# Patient Record
Sex: Male | Born: 1935
Health system: Southern US, Community
[De-identification: ages and names within clinical notes are randomized; demographics above are authoritative.]

## PROBLEM LIST (undated history)

## (undated) DIAGNOSIS — I509 Heart failure, unspecified: Secondary | ICD-10-CM

## (undated) DIAGNOSIS — R531 Weakness: Secondary | ICD-10-CM

## (undated) DIAGNOSIS — F419 Anxiety disorder, unspecified: Secondary | ICD-10-CM

## (undated) DIAGNOSIS — I2510 Atherosclerotic heart disease of native coronary artery without angina pectoris: Secondary | ICD-10-CM

## (undated) DIAGNOSIS — R0609 Other forms of dyspnea: Secondary | ICD-10-CM

## (undated) DIAGNOSIS — Z7722 Contact with and (suspected) exposure to environmental tobacco smoke (acute) (chronic): Secondary | ICD-10-CM

## (undated) DIAGNOSIS — Z87442 Personal history of urinary calculi: Secondary | ICD-10-CM

## (undated) DIAGNOSIS — K5790 Diverticulosis of intestine, part unspecified, without perforation or abscess without bleeding: Secondary | ICD-10-CM

## (undated) DIAGNOSIS — R0600 Dyspnea, unspecified: Secondary | ICD-10-CM

## (undated) DIAGNOSIS — K5730 Diverticulosis of large intestine without perforation or abscess without bleeding: Secondary | ICD-10-CM

## (undated) DIAGNOSIS — I219 Acute myocardial infarction, unspecified: Secondary | ICD-10-CM

## (undated) DIAGNOSIS — E785 Hyperlipidemia, unspecified: Secondary | ICD-10-CM

## (undated) DIAGNOSIS — E663 Overweight: Secondary | ICD-10-CM

## (undated) DIAGNOSIS — N400 Enlarged prostate without lower urinary tract symptoms: Secondary | ICD-10-CM

## (undated) DIAGNOSIS — E139 Other specified diabetes mellitus without complications: Secondary | ICD-10-CM

## (undated) DIAGNOSIS — R3129 Other microscopic hematuria: Secondary | ICD-10-CM

## (undated) DIAGNOSIS — I255 Ischemic cardiomyopathy: Secondary | ICD-10-CM

## (undated) DIAGNOSIS — K219 Gastro-esophageal reflux disease without esophagitis: Secondary | ICD-10-CM

## (undated) DIAGNOSIS — R7301 Impaired fasting glucose: Secondary | ICD-10-CM

## (undated) DIAGNOSIS — I10 Essential (primary) hypertension: Secondary | ICD-10-CM

## (undated) HISTORY — DX: Overweight: E66.3

## (undated) HISTORY — DX: Essential (primary) hypertension: I10

## (undated) HISTORY — DX: Hyperlipidemia, unspecified: E78.5

## (undated) HISTORY — DX: Atherosclerotic heart disease of native coronary artery without angina pectoris: I25.10

## (undated) HISTORY — DX: Anxiety disorder, unspecified: F41.9

## (undated) HISTORY — DX: Impaired fasting glucose: R73.01

## (undated) HISTORY — DX: Benign prostatic hyperplasia without lower urinary tract symptoms: N40.0

## (undated) HISTORY — DX: Other microscopic hematuria: R31.29

## (undated) HISTORY — PX: APPENDECTOMY: SHX54

## (undated) HISTORY — DX: Other specified diabetes mellitus without complications: E13.9

## (undated) HISTORY — DX: Diverticulosis of large intestine without perforation or abscess without bleeding: K57.30

## (undated) HISTORY — DX: Gastro-esophageal reflux disease without esophagitis: K21.9

## (undated) HISTORY — DX: Acute myocardial infarction, unspecified: I21.9

## (undated) HISTORY — DX: Diverticulosis of intestine, part unspecified, without perforation or abscess without bleeding: K57.90

## (undated) HISTORY — PX: PILONIDAL CYST EXCISION: SHX744

## (undated) HISTORY — DX: Ischemic cardiomyopathy: I25.5

---

## 1998-02-10 HISTORY — PX: CORONARY ARTERY BYPASS GRAFT: SHX141

## 1998-02-10 HISTORY — PX: CARDIAC CATHETERIZATION: SHX172

## 1999-02-11 DIAGNOSIS — I219 Acute myocardial infarction, unspecified: Secondary | ICD-10-CM

## 1999-02-11 HISTORY — DX: Acute myocardial infarction, unspecified: I21.9

## 1999-08-20 ENCOUNTER — Ambulatory Visit (HOSPITAL_COMMUNITY): Admission: RE | Admit: 1999-08-20 | Discharge: 1999-08-20 | Payer: Self-pay | Admitting: *Deleted

## 1999-08-28 ENCOUNTER — Encounter: Payer: Self-pay | Admitting: Surgery

## 1999-08-30 ENCOUNTER — Inpatient Hospital Stay (HOSPITAL_COMMUNITY): Admission: RE | Admit: 1999-08-30 | Discharge: 1999-09-03 | Payer: Self-pay | Admitting: Surgery

## 1999-08-31 ENCOUNTER — Encounter: Payer: Self-pay | Admitting: Surgery

## 1999-09-01 ENCOUNTER — Encounter: Payer: Self-pay | Admitting: Surgery

## 1999-09-02 ENCOUNTER — Encounter: Payer: Self-pay | Admitting: Cardiothoracic Surgery

## 2002-02-10 HISTORY — PX: COLONOSCOPY: SHX174

## 2002-09-07 ENCOUNTER — Encounter: Payer: Self-pay | Admitting: Gastroenterology

## 2002-09-28 ENCOUNTER — Encounter: Payer: Self-pay | Admitting: Gastroenterology

## 2002-09-28 LAB — HM COLONOSCOPY

## 2002-11-10 ENCOUNTER — Ambulatory Visit: Admission: RE | Admit: 2002-11-10 | Discharge: 2002-11-10 | Payer: Self-pay | Admitting: Pulmonary Disease

## 2002-11-14 ENCOUNTER — Encounter (HOSPITAL_COMMUNITY): Admission: RE | Admit: 2002-11-14 | Discharge: 2003-02-12 | Payer: Self-pay | Admitting: Cardiology

## 2003-02-11 HISTORY — PX: CARDIAC DEFIBRILLATOR PLACEMENT: SHX171

## 2003-02-13 ENCOUNTER — Encounter (HOSPITAL_COMMUNITY): Admission: RE | Admit: 2003-02-13 | Discharge: 2003-03-13 | Payer: Self-pay | Admitting: Cardiology

## 2003-02-17 ENCOUNTER — Ambulatory Visit: Admission: RE | Admit: 2003-02-17 | Discharge: 2003-02-17 | Payer: Self-pay | Admitting: Pulmonary Disease

## 2003-05-10 ENCOUNTER — Encounter (HOSPITAL_COMMUNITY): Admission: RE | Admit: 2003-05-10 | Discharge: 2003-08-08 | Payer: Self-pay | Admitting: Cardiology

## 2003-08-16 ENCOUNTER — Encounter (HOSPITAL_COMMUNITY): Admission: RE | Admit: 2003-08-16 | Discharge: 2003-11-06 | Payer: Self-pay | Admitting: Cardiology

## 2003-09-20 ENCOUNTER — Observation Stay (HOSPITAL_COMMUNITY): Admission: RE | Admit: 2003-09-20 | Discharge: 2003-09-21 | Payer: Self-pay | Admitting: Internal Medicine

## 2003-11-10 ENCOUNTER — Ambulatory Visit: Admission: RE | Admit: 2003-11-10 | Discharge: 2003-11-10 | Payer: Self-pay | Admitting: Pulmonary Disease

## 2004-01-17 ENCOUNTER — Ambulatory Visit: Payer: Self-pay | Admitting: Internal Medicine

## 2004-02-14 ENCOUNTER — Encounter (HOSPITAL_COMMUNITY): Admission: RE | Admit: 2004-02-14 | Discharge: 2004-05-14 | Payer: Self-pay | Admitting: Cardiology

## 2004-05-15 ENCOUNTER — Encounter (HOSPITAL_COMMUNITY): Admission: RE | Admit: 2004-05-15 | Discharge: 2004-08-13 | Payer: Self-pay | Admitting: Cardiology

## 2004-05-16 ENCOUNTER — Ambulatory Visit: Payer: Self-pay | Admitting: Internal Medicine

## 2004-05-16 ENCOUNTER — Ambulatory Visit: Payer: Self-pay

## 2004-06-21 ENCOUNTER — Ambulatory Visit: Payer: Self-pay | Admitting: Internal Medicine

## 2004-06-24 ENCOUNTER — Ambulatory Visit: Payer: Self-pay | Admitting: Internal Medicine

## 2004-07-02 ENCOUNTER — Ambulatory Visit: Payer: Self-pay | Admitting: Internal Medicine

## 2004-07-11 ENCOUNTER — Ambulatory Visit: Payer: Self-pay

## 2004-08-19 ENCOUNTER — Encounter (HOSPITAL_COMMUNITY): Admission: RE | Admit: 2004-08-19 | Discharge: 2004-08-20 | Payer: Self-pay | Admitting: Cardiology

## 2004-08-19 ENCOUNTER — Ambulatory Visit: Payer: Self-pay | Admitting: Internal Medicine

## 2004-08-23 ENCOUNTER — Ambulatory Visit: Payer: Self-pay | Admitting: Internal Medicine

## 2004-08-26 ENCOUNTER — Ambulatory Visit: Payer: Self-pay | Admitting: Internal Medicine

## 2004-10-21 ENCOUNTER — Ambulatory Visit: Payer: Self-pay | Admitting: Internal Medicine

## 2004-10-24 ENCOUNTER — Ambulatory Visit: Payer: Self-pay | Admitting: Internal Medicine

## 2004-10-29 ENCOUNTER — Ambulatory Visit: Payer: Self-pay | Admitting: Acute Care

## 2004-10-29 ENCOUNTER — Ambulatory Visit: Admission: RE | Admit: 2004-10-29 | Discharge: 2004-10-29 | Payer: Self-pay | Admitting: Pulmonary Disease

## 2004-11-20 ENCOUNTER — Encounter (HOSPITAL_COMMUNITY): Admission: RE | Admit: 2004-11-20 | Discharge: 2004-12-12 | Payer: Self-pay | Admitting: Cardiology

## 2005-01-13 ENCOUNTER — Ambulatory Visit: Payer: Self-pay | Admitting: Internal Medicine

## 2005-01-15 ENCOUNTER — Ambulatory Visit: Payer: Self-pay | Admitting: Internal Medicine

## 2005-01-16 ENCOUNTER — Ambulatory Visit: Payer: Self-pay

## 2005-01-24 ENCOUNTER — Ambulatory Visit: Payer: Self-pay | Admitting: Internal Medicine

## 2005-01-28 ENCOUNTER — Ambulatory Visit: Payer: Self-pay | Admitting: Internal Medicine

## 2005-02-06 ENCOUNTER — Ambulatory Visit: Payer: Self-pay | Admitting: Internal Medicine

## 2005-02-17 ENCOUNTER — Encounter (HOSPITAL_COMMUNITY): Admission: RE | Admit: 2005-02-17 | Discharge: 2005-03-12 | Payer: Self-pay | Admitting: Cardiology

## 2005-02-18 ENCOUNTER — Encounter: Admission: RE | Admit: 2005-02-18 | Discharge: 2005-05-19 | Payer: Self-pay | Admitting: Internal Medicine

## 2005-02-20 ENCOUNTER — Ambulatory Visit: Payer: Self-pay | Admitting: Internal Medicine

## 2005-04-29 ENCOUNTER — Ambulatory Visit: Payer: Self-pay | Admitting: Internal Medicine

## 2005-05-14 ENCOUNTER — Encounter (HOSPITAL_COMMUNITY): Admission: RE | Admit: 2005-05-14 | Discharge: 2005-08-12 | Payer: Self-pay | Admitting: Internal Medicine

## 2005-07-16 ENCOUNTER — Ambulatory Visit: Payer: Self-pay | Admitting: Internal Medicine

## 2005-08-15 ENCOUNTER — Ambulatory Visit: Payer: Self-pay | Admitting: Internal Medicine

## 2005-08-20 ENCOUNTER — Encounter (HOSPITAL_COMMUNITY): Admission: RE | Admit: 2005-08-20 | Discharge: 2005-09-09 | Payer: Self-pay | Admitting: Cardiology

## 2005-08-29 ENCOUNTER — Ambulatory Visit: Payer: Self-pay

## 2005-08-29 ENCOUNTER — Encounter: Payer: Self-pay | Admitting: Internal Medicine

## 2005-08-29 HISTORY — PX: TRANSTHORACIC ECHOCARDIOGRAM: SHX275

## 2005-10-29 ENCOUNTER — Ambulatory Visit: Payer: Self-pay | Admitting: Cardiology

## 2005-11-04 ENCOUNTER — Ambulatory Visit: Payer: Self-pay | Admitting: Internal Medicine

## 2005-11-19 ENCOUNTER — Encounter (HOSPITAL_COMMUNITY): Admission: RE | Admit: 2005-11-19 | Discharge: 2006-02-17 | Payer: Self-pay | Admitting: Cardiology

## 2005-12-26 ENCOUNTER — Ambulatory Visit: Payer: Self-pay

## 2006-01-27 ENCOUNTER — Ambulatory Visit: Payer: Self-pay | Admitting: Internal Medicine

## 2006-02-10 HISTORY — PX: CATARACT EXTRACTION: SUR2

## 2006-02-17 ENCOUNTER — Ambulatory Visit: Payer: Self-pay | Admitting: Internal Medicine

## 2006-03-04 ENCOUNTER — Ambulatory Visit: Payer: Self-pay

## 2006-03-04 LAB — CONVERTED CEMR LAB
ALT: 23 units/L (ref 0–40)
AST: 23 units/L (ref 0–37)
Albumin: 3.6 g/dL (ref 3.5–5.2)
Alkaline Phosphatase: 45 units/L (ref 39–117)
Bilirubin, Direct: 0.1 mg/dL (ref 0.0–0.3)
Cholesterol: 125 mg/dL (ref 0–200)
HDL: 43.7 mg/dL (ref >39.0)
Hgb A1c MFr Bld: 6.1 % — ABNORMAL HIGH (ref 4.6–6.0)
LDL Cholesterol: 61 mg/dL (ref 0–99)
Total Bilirubin: 0.9 mg/dL (ref 0.3–1.2)
Total CHOL/HDL Ratio: 2.9
Total Protein: 6.6 g/dL (ref 6.0–8.3)
Triglycerides: 101 mg/dL (ref 0–149)
VLDL: 20 mg/dL (ref 0–40)

## 2006-03-18 ENCOUNTER — Ambulatory Visit: Payer: Self-pay | Admitting: Internal Medicine

## 2006-05-13 ENCOUNTER — Ambulatory Visit: Payer: Self-pay | Admitting: Internal Medicine

## 2006-05-18 ENCOUNTER — Ambulatory Visit: Payer: Self-pay | Admitting: Internal Medicine

## 2006-09-15 ENCOUNTER — Ambulatory Visit: Payer: Self-pay | Admitting: Internal Medicine

## 2006-09-25 ENCOUNTER — Ambulatory Visit: Payer: Self-pay | Admitting: Internal Medicine

## 2006-09-25 DIAGNOSIS — I2510 Atherosclerotic heart disease of native coronary artery without angina pectoris: Secondary | ICD-10-CM | POA: Insufficient documentation

## 2006-09-25 DIAGNOSIS — M1990 Unspecified osteoarthritis, unspecified site: Secondary | ICD-10-CM | POA: Insufficient documentation

## 2006-09-25 DIAGNOSIS — R319 Hematuria, unspecified: Secondary | ICD-10-CM | POA: Insufficient documentation

## 2006-09-25 DIAGNOSIS — I152 Hypertension secondary to endocrine disorders: Secondary | ICD-10-CM | POA: Insufficient documentation

## 2006-09-25 DIAGNOSIS — E1159 Type 2 diabetes mellitus with other circulatory complications: Secondary | ICD-10-CM

## 2006-09-25 DIAGNOSIS — I10 Essential (primary) hypertension: Secondary | ICD-10-CM | POA: Insufficient documentation

## 2006-09-26 LAB — CONVERTED CEMR LAB
ALT: 30 units/L (ref 0–53)
AST: 24 units/L (ref 0–37)
Albumin: 3.9 g/dL (ref 3.5–5.2)
Alkaline Phosphatase: 48 units/L (ref 39–117)
BUN: 19 mg/dL (ref 6–23)
Basophils Absolute: 0.1 10*3/uL (ref 0.0–0.1)
Basophils Relative: 0.8 % (ref 0.0–1.0)
Bilirubin, Direct: 0.1 mg/dL (ref 0.0–0.3)
CO2: 29 meq/L (ref 19–32)
Calcium: 9.3 mg/dL (ref 8.4–10.5)
Chloride: 111 meq/L (ref 96–112)
Cholesterol: 134 mg/dL (ref 0–200)
Creatinine, Ser: 1.2 mg/dL (ref 0.4–1.5)
Eosinophils Absolute: 0.2 10*3/uL (ref 0.0–0.6)
Eosinophils Relative: 3.7 % (ref 0.0–5.0)
GFR calc Af Amer: 76 mL/min
GFR calc non Af Amer: 63 mL/min
Glucose, Bld: 136 mg/dL — ABNORMAL HIGH (ref 70–99)
HCT: 43 % (ref 39.0–52.0)
HDL: 41 mg/dL (ref >39.0)
Hemoglobin: 15.1 g/dL (ref 13.0–17.0)
Hgb A1c MFr Bld: 6.2 % — ABNORMAL HIGH (ref 4.6–6.0)
LDL Cholesterol: 63 mg/dL (ref 0–99)
Lymphocytes Relative: 40.7 % (ref 12.0–46.0)
MCHC: 35.1 g/dL (ref 30.0–36.0)
MCV: 92.4 fL (ref 78.0–100.0)
Monocytes Absolute: 0.6 10*3/uL (ref 0.2–0.7)
Monocytes Relative: 9.5 % (ref 3.0–11.0)
Neutro Abs: 2.9 10*3/uL (ref 1.4–7.7)
Neutrophils Relative %: 45.3 % (ref 43.0–77.0)
PSA: 0.67 ng/mL (ref 0.10–4.00)
Platelets: 177 10*3/uL (ref 150–400)
Potassium: 4.1 meq/L (ref 3.5–5.1)
RBC: 4.65 M/uL (ref 4.22–5.81)
RDW: 12 % (ref 11.5–14.6)
Sodium: 145 meq/L (ref 135–145)
TSH: 0.75 microintl units/mL (ref 0.35–5.50)
Total Bilirubin: 0.9 mg/dL (ref 0.3–1.2)
Total CHOL/HDL Ratio: 3.3
Total Protein: 6.7 g/dL (ref 6.0–8.3)
Triglycerides: 149 mg/dL (ref 0–149)
VLDL: 30 mg/dL (ref 0–40)
WBC: 6.4 10*3/uL (ref 4.5–10.5)

## 2006-09-28 ENCOUNTER — Encounter (INDEPENDENT_AMBULATORY_CARE_PROVIDER_SITE_OTHER): Payer: Self-pay | Admitting: *Deleted

## 2006-12-22 ENCOUNTER — Ambulatory Visit: Payer: Self-pay | Admitting: Internal Medicine

## 2006-12-25 ENCOUNTER — Telehealth (INDEPENDENT_AMBULATORY_CARE_PROVIDER_SITE_OTHER): Payer: Self-pay | Admitting: *Deleted

## 2007-01-12 ENCOUNTER — Ambulatory Visit: Payer: Self-pay | Admitting: Internal Medicine

## 2007-01-12 ENCOUNTER — Encounter (INDEPENDENT_AMBULATORY_CARE_PROVIDER_SITE_OTHER): Payer: Self-pay | Admitting: *Deleted

## 2007-04-14 ENCOUNTER — Ambulatory Visit: Payer: Self-pay | Admitting: Internal Medicine

## 2007-04-22 ENCOUNTER — Ambulatory Visit: Payer: Self-pay | Admitting: Internal Medicine

## 2007-04-22 DIAGNOSIS — E785 Hyperlipidemia, unspecified: Secondary | ICD-10-CM | POA: Insufficient documentation

## 2007-04-22 DIAGNOSIS — E1169 Type 2 diabetes mellitus with other specified complication: Secondary | ICD-10-CM | POA: Insufficient documentation

## 2007-05-01 LAB — CONVERTED CEMR LAB
ALT: 28 units/L (ref 0–53)
AST: 24 units/L (ref 0–37)
Bilirubin, Direct: 0.1 mg/dL (ref 0.0–0.3)
Cholesterol: 114 mg/dL (ref 0–200)
Creatinine,U: 185.3 mg/dL
HDL: 41.9 mg/dL (ref >39.0)
Potassium: 4.2 meq/L (ref 3.5–5.1)
Total Bilirubin: 0.9 mg/dL (ref 0.3–1.2)
Total Protein: 7 g/dL (ref 6.0–8.3)
Triglycerides: 92 mg/dL (ref 0–149)

## 2007-05-03 ENCOUNTER — Encounter (INDEPENDENT_AMBULATORY_CARE_PROVIDER_SITE_OTHER): Payer: Self-pay | Admitting: *Deleted

## 2007-07-14 ENCOUNTER — Ambulatory Visit: Payer: Self-pay | Admitting: Internal Medicine

## 2007-10-22 ENCOUNTER — Ambulatory Visit: Payer: Self-pay | Admitting: Internal Medicine

## 2007-10-22 DIAGNOSIS — K5730 Diverticulosis of large intestine without perforation or abscess without bleeding: Secondary | ICD-10-CM | POA: Insufficient documentation

## 2007-10-22 DIAGNOSIS — D492 Neoplasm of unspecified behavior of bone, soft tissue, and skin: Secondary | ICD-10-CM | POA: Insufficient documentation

## 2007-10-22 DIAGNOSIS — N400 Enlarged prostate without lower urinary tract symptoms: Secondary | ICD-10-CM | POA: Insufficient documentation

## 2007-10-22 HISTORY — DX: Diverticulosis of large intestine without perforation or abscess without bleeding: K57.30

## 2007-10-23 ENCOUNTER — Encounter: Payer: Self-pay | Admitting: Internal Medicine

## 2007-10-25 ENCOUNTER — Encounter (INDEPENDENT_AMBULATORY_CARE_PROVIDER_SITE_OTHER): Payer: Self-pay | Admitting: *Deleted

## 2007-10-25 LAB — CONVERTED CEMR LAB
ALT: 25 units/L (ref 0–53)
BUN: 21 mg/dL (ref 6–23)
Bilirubin, Direct: 0.2 mg/dL (ref 0.0–0.3)
CO2: 24 meq/L (ref 19–32)
Calcium: 9.8 mg/dL (ref 8.4–10.5)
Cholesterol: 149 mg/dL (ref 0–200)
Eosinophils Absolute: 0.2 10*3/uL (ref 0.0–0.7)
Eosinophils Relative: 3 % (ref 0–5)
Glucose, Bld: 124 mg/dL — ABNORMAL HIGH (ref 70–99)
HCT: 46.7 % (ref 39.0–52.0)
Hgb A1c MFr Bld: 5.9 % (ref 4.6–6.1)
Indirect Bilirubin: 0.6 mg/dL (ref 0.0–0.9)
Lymphocytes Relative: 36 % (ref 12–46)
Lymphs Abs: 2.6 10*3/uL (ref 0.7–4.0)
MCV: 93 fL (ref 78.0–100.0)
Microalb, Ur: 0.87 mg/dL (ref 0.00–1.89)
Monocytes Relative: 8 % (ref 3–12)
Potassium: 4.4 meq/L (ref 3.5–5.3)
RBC: 5.02 M/uL (ref 4.22–5.81)
Sodium: 142 meq/L (ref 135–145)
TSH: 0.645 microintl units/mL (ref 0.350–4.50)
Total Bilirubin: 0.8 mg/dL (ref 0.3–1.2)
Total CHOL/HDL Ratio: 2.9
VLDL: 25 mg/dL (ref 0–40)
WBC: 7.2 10*3/uL (ref 4.0–10.5)

## 2007-10-28 ENCOUNTER — Ambulatory Visit: Payer: Self-pay | Admitting: Internal Medicine

## 2007-10-28 LAB — CONVERTED CEMR LAB
OCCULT 1: NEGATIVE
OCCULT 2: NEGATIVE
OCCULT 3: NEGATIVE

## 2007-10-29 ENCOUNTER — Ambulatory Visit: Payer: Self-pay | Admitting: Internal Medicine

## 2007-10-29 ENCOUNTER — Encounter (INDEPENDENT_AMBULATORY_CARE_PROVIDER_SITE_OTHER): Payer: Self-pay | Admitting: *Deleted

## 2007-11-25 ENCOUNTER — Encounter: Payer: Self-pay | Admitting: Internal Medicine

## 2007-11-29 ENCOUNTER — Ambulatory Visit: Payer: Self-pay | Admitting: Internal Medicine

## 2007-12-27 ENCOUNTER — Ambulatory Visit: Payer: Self-pay | Admitting: Gastroenterology

## 2008-01-25 ENCOUNTER — Ambulatory Visit: Payer: Self-pay | Admitting: Internal Medicine

## 2008-03-13 ENCOUNTER — Encounter: Payer: Self-pay | Admitting: Internal Medicine

## 2008-04-25 ENCOUNTER — Ambulatory Visit: Payer: Self-pay | Admitting: Internal Medicine

## 2008-06-05 ENCOUNTER — Ambulatory Visit: Payer: Self-pay | Admitting: Internal Medicine

## 2008-06-14 ENCOUNTER — Encounter: Payer: Self-pay | Admitting: Internal Medicine

## 2008-07-25 ENCOUNTER — Ambulatory Visit: Payer: Self-pay | Admitting: Internal Medicine

## 2008-08-14 ENCOUNTER — Encounter: Payer: Self-pay | Admitting: Internal Medicine

## 2008-09-14 ENCOUNTER — Ambulatory Visit: Payer: Self-pay | Admitting: Internal Medicine

## 2008-09-14 DIAGNOSIS — E1151 Type 2 diabetes mellitus with diabetic peripheral angiopathy without gangrene: Secondary | ICD-10-CM | POA: Insufficient documentation

## 2008-09-14 DIAGNOSIS — E119 Type 2 diabetes mellitus without complications: Secondary | ICD-10-CM | POA: Insufficient documentation

## 2008-09-15 ENCOUNTER — Encounter (INDEPENDENT_AMBULATORY_CARE_PROVIDER_SITE_OTHER): Payer: Self-pay | Admitting: *Deleted

## 2008-09-15 LAB — CONVERTED CEMR LAB
ALT: 34 units/L (ref 0–53)
Albumin: 4 g/dL (ref 3.5–5.2)
Basophils Relative: 0.9 % (ref 0.0–3.0)
Bilirubin, Direct: 0.1 mg/dL (ref 0.0–0.3)
CO2: 26 meq/L (ref 19–32)
Chloride: 106 meq/L (ref 96–112)
Creatinine, Ser: 1.1 mg/dL (ref 0.4–1.5)
Eosinophils Absolute: 0.2 10*3/uL (ref 0.0–0.7)
Eosinophils Relative: 2.5 % (ref 0.0–5.0)
HCT: 42.6 % (ref 39.0–52.0)
Hemoglobin: 14.9 g/dL (ref 13.0–17.0)
Hgb A1c MFr Bld: 6 % (ref 4.6–6.5)
LDL Cholesterol: 75 mg/dL (ref 0–99)
MCHC: 34.8 g/dL (ref 30.0–36.0)
MCV: 94.1 fL (ref 78.0–100.0)
Monocytes Absolute: 0.5 10*3/uL (ref 0.1–1.0)
Neutro Abs: 3.4 10*3/uL (ref 1.4–7.7)
Neutrophils Relative %: 53.9 % (ref 43.0–77.0)
Potassium: 4.2 meq/L (ref 3.5–5.1)
RBC: 4.53 M/uL (ref 4.22–5.81)
Sodium: 142 meq/L (ref 135–145)
Total CHOL/HDL Ratio: 3
Total Protein: 7.3 g/dL (ref 6.0–8.3)
Triglycerides: 84 mg/dL (ref 0.0–149.0)
WBC: 6.5 10*3/uL (ref 4.5–10.5)

## 2008-10-18 ENCOUNTER — Telehealth (INDEPENDENT_AMBULATORY_CARE_PROVIDER_SITE_OTHER): Payer: Self-pay | Admitting: *Deleted

## 2008-10-30 ENCOUNTER — Telehealth: Payer: Self-pay | Admitting: Internal Medicine

## 2009-01-16 ENCOUNTER — Ambulatory Visit: Payer: Self-pay | Admitting: Internal Medicine

## 2009-01-16 DIAGNOSIS — Z95810 Presence of automatic (implantable) cardiac defibrillator: Secondary | ICD-10-CM | POA: Insufficient documentation

## 2009-03-01 ENCOUNTER — Ambulatory Visit: Payer: Self-pay | Admitting: Internal Medicine

## 2009-03-02 ENCOUNTER — Ambulatory Visit: Payer: Self-pay | Admitting: Internal Medicine

## 2009-03-06 ENCOUNTER — Encounter: Payer: Self-pay | Admitting: Internal Medicine

## 2009-03-06 LAB — CONVERTED CEMR LAB
ALT: 23 units/L (ref 0–53)
AST: 23 units/L (ref 0–37)
Alkaline Phosphatase: 48 units/L (ref 39–117)
CO2: 26 meq/L (ref 19–32)
Calcium: 9.1 mg/dL (ref 8.4–10.5)
Creatinine, Ser: 1.1 mg/dL (ref 0.4–1.5)
GFR calc non Af Amer: 68.98 mL/min (ref >60)
Glucose, Bld: 113 mg/dL — ABNORMAL HIGH (ref 70–99)
Total Bilirubin: 0.9 mg/dL (ref 0.3–1.2)
Total CHOL/HDL Ratio: 3
Triglycerides: 78 mg/dL (ref 0.0–149.0)

## 2009-04-17 ENCOUNTER — Ambulatory Visit: Payer: Self-pay | Admitting: Internal Medicine

## 2009-04-24 ENCOUNTER — Encounter: Payer: Self-pay | Admitting: Internal Medicine

## 2009-07-17 ENCOUNTER — Encounter: Payer: Self-pay | Admitting: Internal Medicine

## 2009-07-17 ENCOUNTER — Ambulatory Visit: Payer: Self-pay | Admitting: Internal Medicine

## 2009-07-31 ENCOUNTER — Encounter: Payer: Self-pay | Admitting: Internal Medicine

## 2009-09-17 ENCOUNTER — Ambulatory Visit: Payer: Self-pay | Admitting: Internal Medicine

## 2009-09-17 DIAGNOSIS — R21 Rash and other nonspecific skin eruption: Secondary | ICD-10-CM | POA: Insufficient documentation

## 2009-09-24 ENCOUNTER — Ambulatory Visit: Payer: Self-pay | Admitting: Internal Medicine

## 2009-09-24 ENCOUNTER — Encounter (INDEPENDENT_AMBULATORY_CARE_PROVIDER_SITE_OTHER): Payer: Self-pay | Admitting: *Deleted

## 2009-09-24 LAB — CONVERTED CEMR LAB
ALT: 30 units/L (ref 0–53)
AST: 28 units/L (ref 0–37)
Basophils Relative: 0.2 % (ref 0.0–3.0)
Eosinophils Relative: 4.3 % (ref 0.0–5.0)
GFR calc non Af Amer: 68.88 mL/min (ref >60)
HCT: 42.6 % (ref 39.0–52.0)
Hemoglobin: 14.6 g/dL (ref 13.0–17.0)
LDL Cholesterol: 68 mg/dL (ref 0–99)
Lymphs Abs: 2.5 10*3/uL (ref 0.7–4.0)
Monocytes Relative: 8.8 % (ref 3.0–12.0)
Neutro Abs: 2.2 10*3/uL (ref 1.4–7.7)
OCCULT 3: NEGATIVE
Potassium: 4.2 meq/L (ref 3.5–5.1)
RBC: 4.48 M/uL (ref 4.22–5.81)
Sodium: 144 meq/L (ref 135–145)
TSH: 0.54 microintl units/mL (ref 0.35–5.50)
Total Bilirubin: 0.7 mg/dL (ref 0.3–1.2)
VLDL: 20.8 mg/dL (ref 0.0–40.0)
WBC: 5.4 10*3/uL (ref 4.5–10.5)

## 2009-10-02 ENCOUNTER — Ambulatory Visit: Payer: Self-pay | Admitting: Internal Medicine

## 2009-10-02 DIAGNOSIS — I255 Ischemic cardiomyopathy: Secondary | ICD-10-CM | POA: Insufficient documentation

## 2009-10-18 ENCOUNTER — Ambulatory Visit: Payer: Self-pay | Admitting: Internal Medicine

## 2009-10-29 ENCOUNTER — Encounter: Payer: Self-pay | Admitting: Internal Medicine

## 2009-11-14 ENCOUNTER — Ambulatory Visit: Payer: Self-pay | Admitting: Internal Medicine

## 2010-01-15 ENCOUNTER — Ambulatory Visit: Payer: Self-pay | Admitting: Internal Medicine

## 2010-03-12 NOTE — Cardiovascular Report (Signed)
Summary: Office Visit Remote   Office Visit Remote   Imported By: Roderic Ovens 08/04/2009 11:58:45  _____________________________________________________________________  External Attachment:    Type:   Image     Comment:   External Document

## 2010-03-12 NOTE — Letter (Signed)
Summary: Results Follow up Letter  [facility]  [address] [place], [place] [address]   Phone: [phone]  Fax: [phone]    09/24/2009 MRN: [id number]  Joseph Malone [address] [place], [place]  [address]  Dear Joseph Malone,  The following are the results of your recent test(s):  Test         Result    Pap Smear:        Normal _____  Not Normal _____ Comments: ______________________________________________________ Cholesterol: LDL(Bad cholesterol):         Your goal is less than:         HDL (Good cholesterol):       Your goal is more than: Comments:  ______________________________________________________ Mammogram:        Normal _____  Not Normal _____ Comments:  ___________________________________________________________________ Hemoccult:        Normal __X___  Not normal _______ Comments:    _____________________________________________________________________ Other Tests:    We routinely do not discuss normal results over the telephone.  If you desire a copy of the results, or you have any questions about this information we can discuss them at your next office visit.   Sincerely,

## 2010-03-12 NOTE — Assessment & Plan Note (Signed)
Summary: pneumonia immun/cbs   Nurse Visit   Allergies: 1)  Pcn  Immunizations Administered:  Pneumonia Vaccine:    Vaccine Type: Pneumovax (Medicare)    Site: left deltoid    Mfr: Merck    Dose: 0.5 ml    Route: IM    Given by: Shonna Chock CMA    Exp. Date: 02/27/2011    Lot #: [id number]  Orders Added: 1)  Pneumococcal Vaccine [90732] 2)  Admin 1st Vaccine [04540]

## 2010-03-12 NOTE — Cardiovascular Report (Signed)
Summary: Office Visit Remote   Office Visit Remote   Imported By: Roderic Ovens 10/30/2009 12:41:16  _____________________________________________________________________  External Attachment:    Type:   Image     Comment:   External Document

## 2010-03-12 NOTE — Cardiovascular Report (Signed)
Summary: Office Visit Remote   Office Visit Remote   Imported By: Roderic Ovens 04/25/2009 11:18:15  _____________________________________________________________________  External Attachment:    Type:   Image     Comment:   External Document

## 2010-03-12 NOTE — Assessment & Plan Note (Signed)
Summary: cpx/ns/kdc   Vital Signs:  Patient profile:   77 year old male Height:      71.75 inches Weight:      245.2 pounds Temp:     97.8 degrees F oral Pulse rate:   72 / minute Resp:     16 per minute BP sitting:   118 / 78  (left arm) Cuff size:   regular  Vitals Entered By: Shonna Chock CMA (September 17, 2009 10:56 AM) CC: CPX with fasting labs , EKG completed in Jan 2011 with Cardiologist, Type 2 diabetes mellitus follow-up Comments Patient was given DTaP vaccine today and told to return in 7days or more for pneumonia vaccine   Primary Care Philomene Haff:  Marga Melnick, MD  CC:  CPX with fasting labs , EKG completed in Jan 2011 with Cardiologist, and Type 2 diabetes mellitus follow-up.  History of Present Illness: Here for Medicare AWV: 1.Risk factors based on Past M, S, F history:Dyslipidemia; HTN; Fasting Hyperglycemia 2.Physical Activities:see data  3.Depression/mood:denied  4.Hearing: whisper heard @ 6 ft 5.ADL's: no limitations 6.Fall Risk: none 7.Home Safety: no risk 8.Height, weight, &visual acuity:wall chart read @ 6 ft 9.Counseling:  none requested 10.Labs ordered based on risk factors: see Orders 11.Referral Coordination: none requested  12. Care Plan: see Instructions 13.Cognitive Assessment: Oriented X 3; memory & recall  intact  ; substraction of 7 good; mood & affect normal. Hyperlipidemia  Follow-Up      This is a 77 year old man who presents for Hyperlipidemia follow-up.  Lipids were @ goal in 02/2009. The patient denies muscle aches, GI upset, abdominal pain, flushing, itching, constipation, diarrhea( stools are  "oily" with fish oil 3000mg  daily as per his Ophthalmologist), and fatigue.  The patient denies the following symptoms: chest pain/pressure, exercise intolerance, dypsnea, palpitations, syncope, and pedal edema.  Compliance with medications (by patient report) has been near 100%.  Dietary compliance has been good.  Adjunctive measures currently used  by the patient include ASA, folic acid, and fish oil supplements.   Fasting Hyperglycemia  Follow-Up      The patient is also here for fasting hyperglycemia ( FBS 113 in 02/2009) follow-up.  The patient reports weight loss of 7 #, but denies polyuria, polydipsia, blurred vision, self managed hypoglycemia, and numbness of extremities.  The patient denies the following symptoms: neuropathic pain, vomiting, orthostatic symptoms, poor wound healing, intermittent claudication, vision loss, and foot ulcer.  The patient has been measuring capillary blood glucose before breakfast ( 101-130).  Since the last visit, the patient reports having had eye care by an Ophthalmologist ( no retinopathy) and no foot care.  The hyperglycemia is inthe context of  ASCVD.    Preventive Screening-Counseling & Management  Alcohol-Tobacco     Alcohol drinks/day: 0     Smoking Status: never  Caffeine-Diet-Exercise     Caffeine use/day: none     Diet Comments: heart healthy     Does Patient Exercise: yes     Type of exercise: treadmill     Exercise (avg: min/session): 30-60     Times/week: 3  Hep-HIV-STD-Contraception     Dental Visit-last 6 months yes     Sun Exposure-Excessive: no  Safety-Violence-Falls     Seat Belt Use: yes     Firearms in the Home: firearms in the home     Firearm Counseling: not indicated; uses recommended firearm safety measures     Smoke Detectors: yes     Violence in the Home:  no risk noted     Sexual Abuse: no     Fall Risk: none      Sexual History:  currently monogamous.        Drug Use:  never.        Blood Transfusions:  no.        Travel History:  none in past 12 months.    Current Medications (verified): 1)  Simvastatin 40 Mg Tabs (Simvastatin) .... Take 1 Tablet By Mouth At Bedtime 2)  Diovan 80 Mg  Tabs (Valsartan) .Marland Kitchen.. 1 By Mouth Bid 3)  Coreg 25 Mg  Tabs (Carvedilol) .... Take One Tablet By Mouth Twice Daily. 4)  Folic Acid 400iu .... Once Daily 5)  Aspirin Ec 325  Mg Tbec (Aspirin) .... Take One Tablet By Mouth Daily 6)  Multivitamin .... Once Daily 7)  Fish Oil 1000 Mg Caps (Omega-3 Fatty Acids) .Marland Kitchen.. 1 By Mouth Three Times A Day 8)  Glucosamine .... Take One Tablet By Mouth Twice Daily. 9)  Freestyle Lite   Strp (Glucose Blood) .Marland Kitchen.. 1 Time Daily 10)  Freestyle Unistick Ii Lancets   Misc (Lancets) .... Use As Directed 11)  Viagra 100 Mg Tabs (Sildenafil Citrate) .... Prn 12)  Vitamin D3 2000 Unit Caps (Cholecalciferol) .Marland Kitchen.. 1 By Mouth Once Daily  Allergies: 1)  Pcn  Past History:  Past Medical History: Reviewed history from 03/01/2009 and no changes required.  1.  Coronary artery disease        S/P  anterior MI 2001;          --CABG 2000 :  LIMA- LAD;           --Myoview 1/09 EF 38. large anterior scar. no ischemia  2. CHF due to ischemic CM        --EF 35-40% range        S/P single-chamber ICD.  (Sprint Fidelis lead)  3. Weight excess  4. HTN  5. Hyperlipidemia  6. Glucose intolerance (fasting hyperglycemia)  7. BPH  8. Hematuria,micropscopic , PMH of , Dr Wanda Plump  9. Diverticulosis  Past Surgical History: Coronary artery bypass graft, single vessel 2000, Cath prior to CBAG in 2000; Defibrillator 2005 Appendectomy; pilonidal cystectomy Colonoscopy 2004 : tics ; defibrillator 2005 Cataract extraction bilaterally  with lens implants 2008  Family History: Father: HTN,MI @ 4 Mother: negative , d @ 100 Siblings: 2 bro: DM,CAD; PGF: d 12 ? heat related; P Guncle :died < 22    Social History: Retired; former Theatre stage manager Alcohol use-no Never Smoked Caffeine use/day:  none Dental Care w/in 6 mos.:  yes Sun Exposure-Excessive:  no Risk analyst Use:  yes Fall Risk:  none Sexual History:  currently monogamous Drug Use:  never Blood Transfusions:  no  Physical Exam  General:  well-nourished; alert,appropriate and cooperative throughout examination; appears younger than age Head:  Normocephalic and atraumatic ; boss / exostosis  L forehead.  Eyes:  No corneal or conjunctival inflammation noted.Perrla. Funduscopic exam benign, without hemorrhages, exudates or papilledema.Arteriolar narrowing. Vision grossly normal. Ears:  External ear exam shows no significant lesions or deformities.  Otoscopic examination reveals clear canals, tympanic membranes are intact bilaterally without bulging, retraction, inflammation or discharge. Hearing is grossly normal bilaterally. Nose:  External nasal examination shows no deformity or inflammation. Nasal mucosa are pink and moist without lesions or exudates. Mouth:  Oral mucosa and oropharynx without lesions or exudates.  Teeth in good repair. Neck:  No deformities, masses, or tenderness noted. Lungs:  Normal respiratory effort, chest expands symmetrically. Lungs are clear to auscultation, no crackles or wheezes. Heart:  Normal rate and regular rhythm. S1 and S2 normal without gallop, murmur, click, rub or other extra sounds. Abdomen:  Bowel sounds positive,abdomen soft and non-tender without masses, organomegaly or hernias noted. Rectal:  No external abnormalities noted. Normal sphincter tone. No rectal masses or tenderness. Genitalia:  Testes bilaterally descended without nodularity, tenderness or masses. No scrotal masses or lesions. No penis lesions or urethral discharge. Small L varicocele.   Prostate:  Prostate gland firm and smooth, no enlargement, nodularity, tenderness, mass, asymmetry or induration. Msk:  No deformity or scoliosis noted of thoracic or lumbar spine.   Pulses:  R and L carotid,radial,dorsalis pedis and posterior tibial pulses are full and equal bilaterally Extremities:  No clubbing, cyanosis, edema, or deformity noted with normal full range of motion of all joints.   deformed great toe nails Neurologic:  alert & oriented X3, sensation intact to light touch over feet, and DTRs symmetrical and normal.   Skin:  Intact without suspicious lesions . dry rash L temple ,  Rosacea Cervical Nodes:  No lymphadenopathy noted Axillary Nodes:  No palpable lymphadenopathy Inguinal Nodes:  No significant adenopathy Psych:  memory intact for recent and remote, normally interactive, good eye contact, not anxious appearing, and not depressed appearing.     Impression & Recommendations:  Problem # 1:  PREVENTIVE HEALTH CARE (ICD-V70.0)  Orders: MC -Subsequent Annual Wellness Visit [phone]) TLB-CBC Platelet - w/Differential (85025-CBCD)  Problem # 2:  HYPERGLYCEMIA, FASTING (ICD-790.29)  Orders: Venipuncture (02725) TLB-A1C / Hgb A1C (Glycohemoglobin) (83036-A1C)  Problem # 3:  RASH-NONVESICULAR (ICD-782.1)  Problem # 4:  HYPERLIPIDEMIA (ICD-272.4)  His updated medication list for this problem includes:    Simvastatin 40 Mg Tabs (Simvastatin) .Marland Kitchen... Take 1 tablet by mouth at bedtime  Orders: Venipuncture (36644) TLB-Lipid Panel (80061-LIPID) TLB-Hepatic/Liver Function Pnl (80076-HEPATIC) TLB-TSH (Thyroid Stimulating Hormone) (84443-TSH)  Problem # 5:  HYPERTENSION, ESSENTIAL NOS (ICD-401.9)  Controlled  His updated medication list for this problem includes:    Diovan 80 Mg Tabs (Valsartan) .Marland Kitchen... 1 by mouth bid    Coreg 25 Mg Tabs (Carvedilol) .Marland Kitchen... Take one tablet by mouth twice daily.  Orders: Venipuncture (03474) TLB-BMP (Basic Metabolic Panel-BMET) (80048-METABOL)  Problem # 6:  CORONARY ARTERY DISEASE (ICD-414.00) as per Dr Gala Romney His updated medication list for this problem includes:    Diovan 80 Mg Tabs (Valsartan) .Marland Kitchen... 1 by mouth bid    Coreg 25 Mg Tabs (Carvedilol) .Marland Kitchen... Take one tablet by mouth twice daily.    Aspirin Ec 325 Mg Tbec (Aspirin) .Marland Kitchen... Take one tablet by mouth daily  Complete Medication List: 1)  Simvastatin 40 Mg Tabs (Simvastatin) .... Take 1 tablet by mouth at bedtime 2)  Diovan 80 Mg Tabs (Valsartan) .Marland Kitchen.. 1 by mouth bid 3)  Coreg 25 Mg Tabs (Carvedilol) .... Take one tablet by mouth twice daily. 4)  Folic Acid  400iu  .... Once daily 5)  Aspirin Ec 325 Mg Tbec (Aspirin) .... Take one tablet by mouth daily 6)  Multivitamin  .... Once daily 7)  Fish Oil 1000 Mg Caps (Omega-3 fatty acids) .Marland Kitchen.. 1 by mouth three times a day 8)  Glucosamine  .... Take one tablet by mouth twice daily. 9)  Freestyle Lite Strp (Glucose blood) .Marland Kitchen.. 1 time daily 10)  Freestyle Unistick Ii Lancets Misc (Lancets) .... Use as directed 11)  Viagra 100 Mg Tabs (Sildenafil citrate) .... Prn 12)  Vitamin  D3 2000 Unit Caps (Cholecalciferol) .Marland Kitchen.. 1 by mouth once daily  Other Orders: Tdap => 24yrs IM ([address]) Admin 1st Vaccine (60454)  Patient Instructions: 1)  Cort Aid two times a day to L temple rash. Derm consultation if no better over 3-4 weeks.Flax Seed Oil in place of Fish Oil ; monitor stool effects.It is important that you exercise regularly at least 20 minutes 5 times a week. If you develop chest pain, have severe difficulty breathing, or feel very tired , stop exercising immediately and seek medical attention. 2)  Check your blood sugars regularly. If your readings are usually above :150 or below90 you should contact our office. 3)  See your eye doctor yearly to check for diabetic eye damage. 4)  Check your feet each night for sore areas, calluses or signs of infection. 5)  Check your Blood Pressure regularly. If it is above: 135/85 ON AVERAGE  you should make an appointment.   Immunizations Administered:  Tetanus Vaccine:    Vaccine Type: Tdap    Site: right deltoid    Mfr: GlaxoSmithKline    Dose: 0.5 ml    Route: IM    Given by: Shonna Chock CMA    Exp. Date: 05/05/2011    Lot #: [id number]    VIS given: 12/29/06 version given September 17, 2009.   Appended Document: cpx/ns/kdc  Laboratory Results   Urine Tests   Date/Time Reported: September 17, 2009 12:44 PM   Routine Urinalysis   Color: yellow Appearance: Clear Glucose: negative   (Normal Range: Negative) Bilirubin: negative   (Normal Range:  Negative) Ketone: negative   (Normal Range: Negative) Spec. Gravity: 1.025   (Normal Range: 1.003-1.035) Blood: negative   (Normal Range: Negative) pH: 5.0   (Normal Range: 5.0-8.0) Protein: negative   (Normal Range: Negative) Urobilinogen: negative   (Normal Range: 0-1) Nitrite: negative   (Normal Range: Negative) Leukocyte Esterace: negative   (Normal Range: Negative)    Comments: Floydene Flock  September 17, 2009 12:44 PM

## 2010-03-12 NOTE — Letter (Signed)
Summary: Remote Device Check  [facility], Main Office  [address]   [place], [place] [address]   Phone: [phone]  Fax: [phone]     July 31, 2009 MRN: [id number]   Joseph Malone [address] [place], [place]  [address]   Dear [name],   Your remote transmission was recieved and reviewed by your physician.  All diagnostics were within normal limits for you.  __X___Your next transmission is scheduled for:   10-18-2009.  Please transmit at any time this day.  If you have a wireless device your transmission will be sent automatically.  Sincerely,  Vella Kohler

## 2010-03-12 NOTE — Assessment & Plan Note (Signed)
Summary: DEVICE/SAF    Visit Type:  Follow-up Primary Tyesha Joffe:  Marga Melnick, MD   History of Present Illness: Joseph Malone returns today for ICD followup.  He is a pleasant 77 yo man with a h/o ICM, CHF, NSVT, and is s/p ICD implant.  He denies c/p, sob or peripheral edema. He has had trouble with back pain when he walks.   Current Medications (verified): 1)  Diovan 80 Mg  Tabs (Valsartan) .Marland Kitchen.. 1 By Mouth Bid 2)  Coreg 25 Mg  Tabs (Carvedilol) .... Take One Tablet By Mouth Twice Daily. 3)  Simvastatin 40 Mg Tabs (Simvastatin) .... Take 1 Tablet By Mouth At Bedtime 4)  Folic Acid 400iu .... Once Daily 5)  Aspirin Ec 325 Mg Tbec (Aspirin) .... Take One Tablet By Mouth Daily 6)  Fish Oil 1000 Mg Caps (Omega-3 Fatty Acids) .Marland Kitchen.. 1 By Mouth Three Times A Day 7)  Multivitamin .... Once Daily 8)  Glucosamine .... Take One Tablet By Mouth Twice Daily. 9)  Freestyle Lite   Strp (Glucose Blood) .Marland Kitchen.. 1 Time Daily 10)  Freestyle Unistick Ii Lancets   Misc (Lancets) .... Use As Directed 11)  Viagra 100 Mg Tabs (Sildenafil Citrate) .... As Needed 12)  Vitamin D3 2000 Unit Caps (Cholecalciferol) .Marland Kitchen.. 1 By Mouth Once Daily  Allergies: 1)  Pcn  Past History:  Past Medical History: Last updated: 03/01/2009  1.  Coronary artery disease        S/P  anterior MI 2001;          --CABG 2000 :  LIMA- LAD;           --Myoview 1/09 EF 38. large anterior scar. no ischemia  2. CHF due to ischemic CM        --EF 35-40% range        S/P single-chamber ICD.  (Sprint Fidelis lead)  3. Weight excess  4. HTN  5. Hyperlipidemia  6. Glucose intolerance (fasting hyperglycemia)  7. BPH  8. Hematuria,micropscopic , PMH of , Dr Wanda Plump  9. Diverticulosis  Past Surgical History: Last updated: 09/17/2009 Coronary artery bypass graft, single vessel 2000, Cath prior to CBAG in 2000; Defibrillator 2005 Appendectomy; pilonidal cystectomy Colonoscopy 2004 : tics ; defibrillator 2005 Cataract extraction  bilaterally  with lens implants 2008  Review of Systems  The patient denies chest pain, syncope, dyspnea on exertion, and peripheral edema.    Vital Signs:  Patient profile:   77 year old male Height:      71 inches Weight:      247 pounds BMI:     34.57 Pulse rate:   75 / minute BP sitting:   112 / 70  (right arm)  Vitals Entered By: Joseph Malone CMA (January 15, 2010 10:18 AM)  Physical Exam  General:  well appearing. no resp difficulty HEENT: normal Neck: supple. no JVD. Carotids 2+ bilat; not bruits. No lymphadenopathy or thryomegaly appreciated. Cor: PMI nondisplaced. Regular rate & rhythm. No rubs, gallops, murmur. Lungs: clear Abdomen: soft, nontender, nondistended. No hepatosplenomegaly. No bruits or masses. Good bowel sounds. Extremities: no cyanosis, clubbing, rash, edema Neuro: alert & orientedx3, cranial nerves grossly intact. moves all 4 extremities w/o difficulty. affect pleasant      ICD Specifications Following MD:  Joseph Bunting, MD     ICD Vendor:  Medtronic     ICD Model Number:  7232     ICD Serial Number:  [id number] ICD DOI:  09/20/2003     ICD  Implanting MD:  Joseph Bunting, MD  Lead 1:    Location: RV     DOI: 09/20/2003     Model #: 3762     Serial #: [id number]     Status: active  Indications::  ICM   ICD Follow Up Battery Voltage:  3.02 V     Charge Time:  8.50 seconds     Underlying rhythm:  SR   ICD Device Measurements Right Ventricle:  Amplitude: 8.8 mV, Impedance: 624 ohms, Threshold: 1.0 V at 0.3 msec Shock Impedance: 54/72 ohms   Episodes MS Episodes:  0     Coumadin:  No Shock:  0     ATP:  0     Nonsustained:  0     Atrial Therapies:  0 Ventricular Pacing:  <0.1%  Brady Parameters Mode VVI     Lower Rate Limit:  40      Tachy Zones VF:  176     Next Remote Date:  04/18/2010     Next Cardiology Appt Due:  01/13/2011 Tech Comments:  NORMAL DEVICE FUNCTION.  NO EPISODES SINCE LAST CHECK.  NO CHANGES MADE.  DEMONSTRATED TONES FOR  PT  AND VERIFIED PT HAD LETTER/MAGNET.  CARELINK 04-18-10 AND ROV IN 12 MTHS W/GT. Vella Kohler  January 15, 2010 10:32 AM MD Comments:  Agree with above.  Impression & Recommendations:  Problem # 1:  AUTOMATIC IMPLANTABLE CARDIAC DEFIBRILLATOR SITU (ICD-V45.02) His device is working normally. Will recheck in several months.  Problem # 2:  CARDIOMYOPATHY, ISCHEMIC (ICD-414.8) He denies anginal symptoms. Continue meds as below. His updated medication list for this problem includes:    Diovan 80 Mg Tabs (Valsartan) .Marland Kitchen... 1 by mouth bid    Coreg 25 Mg Tabs (Carvedilol) .Marland Kitchen... Take one tablet by mouth twice daily.    Aspirin Ec 325 Mg Tbec (Aspirin) .Marland Kitchen... Take one tablet by mouth daily  Problem # 3:  HYPERTENSION, ESSENTIAL NOS (ICD-401.9) His blood pressure is well controlled. A low sodium diet is requested. His updated medication list for this problem includes:    Diovan 80 Mg Tabs (Valsartan) .Marland Kitchen... 1 by mouth bid    Coreg 25 Mg Tabs (Carvedilol) .Marland Kitchen... Take one tablet by mouth twice daily.    Aspirin Ec 325 Mg Tbec (Aspirin) .Marland Kitchen... Take one tablet by mouth daily  Patient Instructions: 1)  Your physician wants you to follow-up in: 12 months with Dr Court Joy will receive a reminder letter in the mail two months in advance. If you don't receive a letter, please call our office to schedule the follow-up appointment. 2)  [facility] transmission 04/18/2010  Prevention & Chronic Care Immunizations   Influenza vaccine: Not documented    Tetanus booster: 09/17/2009: Tdap    Pneumococcal vaccine: Pneumovax (Medicare)  (10/02/2009)    H. zoster vaccine: Not documented  Colorectal Screening   Hemoccult: Not documented    Colonoscopy: Results: Diverticulosis.       Location:  [facility].    (09/28/2002)  Other Screening   PSA: 0.58  (10/22/2007)   Smoking status: never  (09/17/2009)  Lipids   Total Cholesterol: 131  (09/17/2009)   LDL: 68  (09/17/2009)   LDL  Direct: Not documented   HDL: 42.20  (09/17/2009)   Triglycerides: 104.0  (09/17/2009)    SGOT (AST): 28  (09/17/2009)   SGPT (ALT): 30  (09/17/2009)   Alkaline phosphatase: 46  (09/17/2009)   Total bilirubin: 0.7  (09/17/2009)  Hypertension  Last Blood Pressure: 112 / 70  (01/15/2010)   Serum creatinine: 1.1  (09/17/2009)   Serum potassium 4.2  (09/17/2009)  Self-Management Support :    Hypertension self-management support: Not documented    Lipid self-management support: Not documented

## 2010-03-12 NOTE — Assessment & Plan Note (Signed)
Summary: f40m per check out/lg/ appt is 11:15/ gd  Medications Added PROBIOTIC  CAPS (PROBIOTIC PRODUCT) once daily      Allergies Added:   Primary Provider:  Marga Melnick, MD   History of Present Illness: Joseph Malone  is a pleasant 77 year old male with a history of coronary artery disease status post large anterior MI with coronary  bypass grafting in 2001 with a LIMA to the LAD.  He has history of  congestive heart failure secondary to resultant ischemic cardiomyopathy,  ejection fraction however is in the 35-40% range.  He is status post  single-chamber ICD.  He did have a nuclear study in March 01, 2006,  which showed an EF of 38% a large anterior infarction, but no ischemia.  The rest of his medical history is notable for obesity, hypertension,  hyperlipidemia, and glucose intolerance.  He denies any intercurrent ICD therapies.  Returns for routine f/u[facility] 30 mins on treadmil at and 3% at least 3-5x/week. Has chronic soreness in chest but no angina. Had one day where he felt terrible and felt it was due to low BP. Has not recurred. No orthopnea, PND or edema. No ICD firings. Checking BP at home with systolics 120.     Current Medications (verified): 1)  Simvastatin 40 Mg Tabs (Simvastatin) .... Take 1 Tablet By Mouth At Bedtime 2)  Diovan 80 Mg  Tabs (Valsartan) .Marland Kitchen.. 1 By Mouth Bid 3)  Coreg 25 Mg  Tabs (Carvedilol) .... Take One Tablet By Mouth Twice Daily. 4)  Folic Acid 400iu .... Once Daily 5)  Aspirin Ec 325 Mg Tbec (Aspirin) .... Take One Tablet By Mouth Daily 6)  Multivitamin .... Once Daily 7)  Fish Oil 1000 Mg Caps (Omega-3 Fatty Acids) .Marland Kitchen.. 1 By Mouth Three Times A Day 8)  Glucosamine .... Take One Tablet By Mouth Twice Daily. 9)  Freestyle Lite   Strp (Glucose Blood) .Marland Kitchen.. 1 Time Daily 10)  Freestyle Unistick Ii Lancets   Misc (Lancets) .... Use As Directed 11)  Viagra 100 Mg Tabs (Sildenafil Citrate) .... Prn 12)  Vitamin D3 2000  Unit Caps (Cholecalciferol) .Marland Kitchen.. 1 By Mouth Once Daily 13)  Probiotic  Caps (Probiotic Product) .... Once Daily  Allergies (verified): 1)  Pcn  Past History:  Past Medical History:  1.  Coronary artery disease        S/P  anterior MI 2001;          --CABG 2000 :  LIMA- LAD;           --Myoview 1/09 EF 38. large anterior scar. no ischemia  2. CHF due to ischemic CM        --EF 35-40% range        S/P single-chamber ICD.  (Sprint Fidelis lead)  3. Weight excess  4. HTN  5. Hyperlipidemia  6. Glucose intolerance (fasting hyperglycemia)  7. BPH  8. Hematuria,micropscopic , PMH of , Dr Wanda Plump  9. Diverticulosis  Review of Systems       As per HPI and past medical history; otherwise all systems negative.   Vital Signs:  Patient profile:   77 year old male Height:      71 inches Weight:      238 pounds BMI:     33.31 Pulse rate:   65 / minute BP sitting:   134 / 80  (left arm) Cuff size:   regular  Vitals Entered By: Hardin Negus, RMA (March 01, 2009 3:38 PM)  Physical Exam  General:  General:  Gen: well appearing. no resp difficulty HEENT: normal Neck: supple. no JVD. Carotids 2+ bilat; not bruits. No lymphadenopathy or thryomegaly appreciated. Cor: PMI nondisplaced. Regular rate & rhythm. No rubs, gallops, murmur. Lungs: clear Abdomen: soft, nontender, nondistended. No hepatosplenomegaly. No bruits or masses. Good bowel sounds. Extremities: no cyanosis, clubbing, rash, edema Neuro: alert & orientedx3, cranial nerves grossly intact. moves all 4 extremities w/o difficulty. affect pleasant    ICD Specifications Following MD:  Lewayne Bunting, MD     ICD Vendor:  Medtronic     ICD Model Number:  7232     ICD Serial Number:  [id number] ICD DOI:  09/20/2003     ICD Implanting MD:  Lewayne Bunting, MD  Lead 1:    Location: RV     DOI: 09/20/2003     Model #: [id number]     Serial #: [id number]     Status: active  Indications::  ICM   Episodes Coumadin:  No  Brady  Parameters Mode VVI     Lower Rate Limit:  40      Tachy Zones VF:  176     Impression & Recommendations:  Problem # 1:  SYSTOLIC HEART FAILURE, CHRONIC (ICD-428.22) Doing very well. NYHA class I. Volume status looks good. On good meds. Continue current regimen.  Problem # 2:  CORONARY ARTERY DISEASE (ICD-414.00) Stable. No evidence of ischemia. Continue current regimen.  Problem # 3:  HYPERTENSION, ESSENTIAL NOS (ICD-401.9) Blood pressure well controlled. Continue current regimen.  Problem # 4:  HYPERLIPIDEMIA (ICD-272.4) Goal LDL < 70. Continue current regimen. Recheck CMET and lipids.  Other Orders: EKG w/ Interpretation (93000)  Patient Instructions: 1)  Labs--bmet, liver, lipid 414.01, 272.0 2)  Follow up in 6 months

## 2010-03-12 NOTE — Letter (Signed)
Summary: Remote Device Check  [facility], Main Office  [address]   [place], [place] [address]   Phone: [phone]  Fax: [phone]     April 24, 2009 MRN: [id number]   Joseph Malone [address] [place], [place]  [address]   Dear Joseph Malone,   Your remote transmission was recieved and reviewed by your physician.  All diagnostics were within normal limits for you.  __X___Your next transmission is scheduled for:   July 17, 2009.  Please transmit at any time this day.  If you have a wireless device your transmission will be sent automatically.     Sincerely,  Proofreader

## 2010-03-12 NOTE — Letter (Signed)
Summary: Remote Device Check  [facility], Main Office  [address]   [place], [place] [address]   Phone: [phone]  Fax: [phone]     October 29, 2009 MRN: [id number]   Joseph Malone [address] [place], [place]  [address]   Dear Mr. SENA,   Your remote transmission was recieved and reviewed by your physician.  All diagnostics were within normal limits for you.  __X____Your next office visit is scheduled for:  01-15-10 @ 1000 with Dr Ladona Ridgel. Please call our office to schedule an appointment.    Sincerely,  Vella Kohler

## 2010-03-12 NOTE — Letter (Signed)
Summary: Custom - Lipid  [facility], Main Office  [address]   [place], [place] [address]   Phone: [phone]  Fax: [phone]     March 06, 2009 MRN: [id number]   Joseph Malone [address] [place], [place]  [address]   Dear [name],  We have reviewed your cholesterol results.  They are as follows:     Total Cholesterol:    111 (Desirable: less than 200)       HDL  Cholesterol:     41.30  (Desirable: greater than 40 for men and 50 for women)       LDL Cholesterol:       54  (Desirable: less than 100 for low risk and less than 70 for moderate to high risk)       Triglycerides:       78.0  (Desirable: less than 150)  Our recommendations include:  Looks good, continue current meds.   Call our office at the number listed above if you have any questions.  Lowering your LDL cholesterol is important, but it is only one of a large number of "risk factors" that may indicate that you are at risk for heart disease, stroke or other complications of hardening of the arteries.  Other risk factors include:   A.  Cigarette Smoking* B.  High Blood Pressure* C.  Obesity* D.   Low HDL Cholesterol (see yours above)* E.   Diabetes Mellitus (higher risk if your is uncontrolled) F.  Family history of premature heart disease G.  Previous history of stroke or cardiovascular disease    *These are risk factors YOU HAVE CONTROL OVER.  For more information, visit .  There is now evidence that lowering the TOTAL CHOLESTEROL AND LDL CHOLESTEROL can reduce the risk of heart disease.  The American Heart Association recommends the following guidelines for the treatment of elevated cholesterol:  1.  If there is now current heart disease and less than two risk factors, TOTAL CHOLESTEROL should be less than 200 and LDL CHOLESTEROL should be less than 100. 2.  If there is current heart disease or two or more risk factors, TOTAL CHOLESTEROL should be less than 200 and LDL  CHOLESTEROL should be less than 70.  A diet low in cholesterol, saturated fat, and calories is the cornerstone of treatment for elevated cholesterol.  Cessation of smoking and exercise are also important in the management of elevated cholesterol and preventing vascular disease.  Studies have shown that 30 to 60 minutes of physical activity most days can help lower blood pressure, lower cholesterol, and keep your weight at a healthy level.  Drug therapy is used when cholesterol levels do not respond to therapeutic lifestyle changes (smoking cessation, diet, and exercise) and remains unacceptably high.  If medication is started, it is important to have you levels checked periodically to evaluate the need for further treatment options.  Thank you,    [facility] Team

## 2010-03-12 NOTE — Assessment & Plan Note (Signed)
Summary: f51m    Visit Type:  Follow-up Primary Provider:  Marga Melnick, MD  CC:  no complaints.  History of Present Illness: Joseph Malone  is a pleasant 77 year old male with a history of coronary artery disease status post large anterior MI with coronary  bypass grafting in 2001 with a LIMA to the LAD.  He has history of  congestive heart failure secondary to resultant ischemic cardiomyopathy,  ejection fraction however is in the 35-40% range.  He is status post  single-chamber ICD.  He did have a nuclear study in March 01, 2006,  which showed an EF of 38% a large anterior infarction, but no ischemia.  The rest of his medical history is notable for obesity, hypertension,  hyperlipidemia, and glucose intolerance.  He denies any intercurrent ICD therapies.  Returns for routine f/u. Doing very well. Continues with his walking program 40 minutes on treadmil at and 4% at least 3-5x/week. + leg lifts. Has chronic soreness in chest but no angina. BP good. . No orthopnea, PND or edema. No ICD firings. Checking BP at home with systolics 120.   Recent labs HGbA1c 6.1%  TC 131 HDL 42 LDL 68 TG 104 Fasting glucose 111    Medications Prior to Update: 1)  Simvastatin 40 Mg Tabs (Simvastatin) .... Take 1 Tablet By Mouth At Bedtime 2)  Diovan 80 Mg  Tabs (Valsartan) .Marland Kitchen.. 1 By Mouth Bid 3)  Coreg 25 Mg  Tabs (Carvedilol) .... Take One Tablet By Mouth Twice Daily. 4)  Folic Acid 400iu .... Once Daily 5)  Aspirin Ec 325 Mg Tbec (Aspirin) .... Take One Tablet By Mouth Daily 6)  Multivitamin .... Once Daily 7)  Fish Oil 1000 Mg Caps (Omega-3 Fatty Acids) .Marland Kitchen.. 1 By Mouth Three Times A Day 8)  Glucosamine .... Take One Tablet By Mouth Twice Daily. 9)  Freestyle Lite   Strp (Glucose Blood) .Marland Kitchen.. 1 Time Daily 10)  Freestyle Unistick Ii Lancets   Misc (Lancets) .... Use As Directed 11)  Viagra 100 Mg Tabs (Sildenafil Citrate) .... Prn 12)  Vitamin D3 2000 Unit Caps (Cholecalciferol) .Marland Kitchen.. 1 By Mouth Once  Daily  Allergies: 1)  Pcn  Past History:  Past Medical History: Last updated: 03/01/2009  1.  Coronary artery disease        S/P  anterior MI 2001;          --CABG 2000 :  LIMA- LAD;           --Myoview 1/09 EF 38. large anterior scar. no ischemia  2. CHF due to ischemic CM        --EF 35-40% range        S/P single-chamber ICD.  (Sprint Fidelis lead)  3. Weight excess  4. HTN  5. Hyperlipidemia  6. Glucose intolerance (fasting hyperglycemia)  7. BPH  8. Hematuria,micropscopic , PMH of , Dr Wanda Plump  9. Diverticulosis  Review of Systems       As per HPI and past medical history; otherwise all systems negative.   Vital Signs:  Patient profile:   77 year old male Height:      71 inches Weight:      245 pounds BMI:     34.29 Pulse rate:   72 / minute BP sitting:   126 / 78  (left arm) Cuff size:   regular  Vitals Entered By: Burnett Kanaris, CNA (October 02, 2009 10:51 AM)  Physical Exam  General:  well appearing. no resp difficulty HEENT:  normal Neck: supple. no JVD. Carotids 2+ bilat; not bruits. No lymphadenopathy or thryomegaly appreciated. Cor: PMI nondisplaced. Regular rate & rhythm. No rubs, gallops, murmur. Lungs: clear Abdomen: soft, nontender, nondistended. No hepatosplenomegaly. No bruits or masses. Good bowel sounds. Extremities: no cyanosis, clubbing, rash, edema Neuro: alert & orientedx3, cranial nerves grossly intact. moves all 4 extremities w/o difficulty. affect pleasant      ICD Specifications Following MD:  Lewayne Bunting, MD     ICD Vendor:  Medtronic     ICD Model Number:  7232     ICD Serial Number:  [id number] ICD DOI:  09/20/2003     ICD Implanting MD:  Lewayne Bunting, MD  Lead 1:    Location: RV     DOI: 09/20/2003     Model #: [id number]     Serial #: [id number]     Status: active  Indications::  ICM   Episodes Coumadin:  No  Brady Parameters Mode VVI     Lower Rate Limit:  40      Tachy Zones VF:  176     Impression &  Recommendations:  Problem # 1:  CORONARY ARTERY DISEASE (ICD-414.00) Doing great. Since it has been 4 years since previous ischemic eval will get routine ETT for surveillance.  Problem # 2:  HYPERTENSION, ESSENTIAL NOS (ICD-401.9) Blood pressure well controlled. Continue current regimen.  Problem # 3:  HYPERLIPIDEMIA (ICD-272.4) Lipids look great. LDL at goal. Continue current regimen.  Problem # 4:  CARDIOMYOPATHY, ISCHEMIC (ICD-414.8)  Doing well. NYHA I. No volume overload. Continue current regimen. Titration of Diovan limited due to previous low BP.   His updated medication list for this problem includes:    Diovan 80 Mg Tabs (Valsartan) .Marland Kitchen... 1 by mouth bid    Coreg 25 Mg Tabs (Carvedilol) .Marland Kitchen... Take one tablet by mouth twice daily.    Aspirin Ec 325 Mg Tbec (Aspirin) .Marland Kitchen... Take one tablet by mouth daily  Other Orders: EKG w/ Interpretation (93000) Treadmill (Treadmill)  Patient Instructions: 1)  Your physician recommends that you continue on your current medications as directed. Please refer to the Current Medication list given to you today. 2)  Your physician wants you to follow-up in: 6 months. You will receive a reminder letter in the mail two months in advance. If you don't receive a letter, please call our office to schedule the follow-up appointment. 3)  Your physician has requested that you have an exercise tolerance test.  For further information please visit [email or web address]  Please also follow instruction sheet, as given.

## 2010-03-15 ENCOUNTER — Other Ambulatory Visit: Payer: Self-pay | Admitting: Internal Medicine

## 2010-03-15 ENCOUNTER — Encounter: Payer: Self-pay | Admitting: Internal Medicine

## 2010-03-15 ENCOUNTER — Ambulatory Visit (INDEPENDENT_AMBULATORY_CARE_PROVIDER_SITE_OTHER)
Admission: RE | Admit: 2010-03-15 | Discharge: 2010-03-15 | Disposition: A | Discharge status: Home / Self Care | Payer: Medicare Other | Source: Ambulatory Visit | Attending: Internal Medicine | Admitting: Internal Medicine

## 2010-03-15 ENCOUNTER — Ambulatory Visit: Admit: 2010-03-15 | Payer: Self-pay | Admitting: Internal Medicine

## 2010-03-15 ENCOUNTER — Ambulatory Visit (INDEPENDENT_AMBULATORY_CARE_PROVIDER_SITE_OTHER): Payer: Medicare Other | Admitting: Internal Medicine

## 2010-03-15 DIAGNOSIS — I428 Other cardiomyopathies: Secondary | ICD-10-CM

## 2010-03-15 DIAGNOSIS — R062 Wheezing: Secondary | ICD-10-CM

## 2010-03-15 DIAGNOSIS — I5022 Chronic systolic (congestive) heart failure: Secondary | ICD-10-CM

## 2010-03-15 DIAGNOSIS — I2589 Other forms of chronic ischemic heart disease: Secondary | ICD-10-CM

## 2010-03-15 DIAGNOSIS — I2510 Atherosclerotic heart disease of native coronary artery without angina pectoris: Secondary | ICD-10-CM

## 2010-03-15 LAB — BASIC METABOLIC PANEL
BUN: 23 mg/dL (ref 6–23)
CO2: 26 mEq/L (ref 19–32)
Calcium: 9.3 mg/dL (ref 8.4–10.5)
Chloride: 107 mEq/L (ref 96–112)
Creatinine, Ser: 1.1 mg/dL (ref 0.4–1.5)
GFR: 68.79 mL/min (ref >60.00)
Glucose, Bld: 123 mg/dL — ABNORMAL HIGH (ref 70–99)
Potassium: 4.6 mEq/L (ref 3.5–5.1)
Sodium: 140 mEq/L (ref 135–145)

## 2010-03-15 LAB — BRAIN NATRIURETIC PEPTIDE: Pro B Natriuretic peptide (BNP): 55.2 pg/mL (ref 0.0–100.0)

## 2010-03-20 NOTE — Assessment & Plan Note (Signed)
Summary: per checkout/sf/hms   Vital Signs:  Patient profile:   77 year old male Height:      71 inches Weight:      249 pounds BMI:     34.85 Pulse rate:   71 / minute BP sitting:   134 / 82  (left arm) Cuff size:   regular  Vitals Entered By: Hardin Negus, RMA (March 15, 2010 11:37 AM)  Visit Type:  Follow-up Primary Provider:  Marga Melnick, MD  CC:  no complaints.  History of Present Illness: Joseph Malone  is a pleasant 77 year old male with a history of coronary artery disease status post large anterior MI with coronary  bypass grafting in 2001 with a LIMA to the LAD.  He has history of  congestive heart failure secondary to resultant ischemic cardiomyopathy,  ejection fraction however is in the 35-40% range.  He is status post  single-chamber ICD.  He did have a nuclear study in March 01, 2006,  which showed an EF of 38% a large anterior infarction, but no ischemia. ETT 10/11 was normal.    The rest of his medical history is notable for obesity, hypertension,  hyperlipidemia, and glucose intolerance.  He denies any intercurrent ICD therapies.  Returns for routine f/u. Doing very well. Continues with his walking program and actually trying to do more. Walking 4 days per week 50-55 mins per day. Has chronic soreness in chest but no angina. BP good. Mild wheezing at night when he lies down. Denies orthopnea, PND or edema. No ICD firings. Checking BP at home with systolics 120. Weight stable. No snoring.   ECG: NSR 71. septal Qs No ST-T wave abnormalities.   Current Medications (verified): 1)  Diovan 80 Mg  Tabs (Valsartan) .Marland Kitchen.. 1 By Mouth Bid 2)  Coreg 25 Mg  Tabs (Carvedilol) .... Take One Tablet By Mouth Twice Daily. 3)  Simvastatin 40 Mg Tabs (Simvastatin) .... Take 1 Tablet By Mouth At Bedtime 4)  Folic Acid 400iu .... Once Daily 5)  Aspirin Ec 325 Mg Tbec (Aspirin) .... Take One Tablet By Mouth Daily 6)  Fish Oil 1000 Mg Caps (Omega-3 Fatty Acids) .Marland Kitchen.. 1 By Mouth Three  Times A Day 7)  Multivitamin .... Once Daily 8)  Glucosamine .... Take One Tablet By Mouth Twice Daily. 9)  Freestyle Lite   Strp (Glucose Blood) .Marland Kitchen.. 1 Time Daily 10)  Freestyle Unistick Ii Lancets   Misc (Lancets) .... Use As Directed 11)  Viagra 100 Mg Tabs (Sildenafil Citrate) .... As Needed 12)  Vitamin D3 2000 Unit Caps (Cholecalciferol) .Marland Kitchen.. 1 By Mouth Once Daily  Allergies (verified): 1)  Pcn  Past History:  Past Medical History: Last updated: 03/01/2009  1.  Coronary artery disease        S/P  anterior MI 2001;          --CABG 2000 :  LIMA- LAD;           --Myoview 1/09 EF 38. large anterior scar. no ischemia  2. CHF due to ischemic CM        --EF 35-40% range        S/P single-chamber ICD.  (Sprint Fidelis lead)  3. Weight excess  4. HTN  5. Hyperlipidemia  6. Glucose intolerance (fasting hyperglycemia)  7. BPH  8. Hematuria,micropscopic , PMH of , Dr Wanda Plump  9. Diverticulosis  Past Surgical History: Last updated: 09/17/2009 Coronary artery bypass graft, single vessel 2000, Cath prior to CBAG in 2000; Defibrillator 2005  Appendectomy; pilonidal cystectomy Colonoscopy 2004 : tics ; defibrillator 2005 Cataract extraction bilaterally  with lens implants 2008  Family History: Last updated: 09/17/2009 Father: HTN,MI @ 96 Mother: negative , d @ 100 Siblings: 2 bro: DM,CAD; PGF: d 48 ? 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   Physical Exam  General:  well appearing. no resp difficulty HEENT: normal Neck: supple. no JVD. Carotids 2+ bilat; not bruits. No lymphadenopathy or  thryomegaly appreciated. Cor: PMI nondisplaced. Regular rate & rhythm. No rubs, gallops, murmur. Lungs: clear Abdomen: soft, nontender, nondistended. No hepatosplenomegaly. No bruits or masses. Good bowel sounds. Extremities: no cyanosis, clubbing, rash, edema Neuro: alert & orientedx3, cranial nerves grossly intact. moves all 4 extremities w/o difficulty. affect pleasant     Impression & Recommendations:  Problem # 1:  CORONARY ARTERY DISEASE (ICD-414.00) Stable. No evidence of ischemia on recent stress test. Continue current regimen.   Problem # 2:  CARDIOMYOPATHY, ISCHEMIC (ICD-414.8) Doing well. NYHA I-II. Volume status looks good. Continue current meds.   Problem # 3:  Nighttime Wheezing Not sure what to make of this. We discussed possibilities of CHF, asthma, OSA and reflux. Currently very mild. No evidence of fluid overload. Will check CXR and BNP. If worsening may need PFTs and consideration of sleep study.   Other Orders: EKG w/ Interpretation (93000) T-2 View CXR (71020TC) Echocardiogram (Echo) TLB-BMP (Basic Metabolic Panel-BMET) (80048-METABOL) TLB-BNP (B-Natriuretic Peptide) (83880-BNPR)  Patient Instructions: 1)  Labs 2)  A chest x-ray takes a picture of the organs and structures inside the chest, including the heart, lungs, and blood vessels. This test can show several things, including, whether the heart is enlarged; whether fluid is building up in the lungs; and whether pacemaker / defibrillator leads are still in place. 3)  Your physician has requested that you have an echocardiogram.  Echocardiography is a painless test that uses sound waves to create images of your heart. It provides your doctor with information about the size and shape of your heart and how well your heart's chambers and valves are working.  This procedure takes approximately one hour. There are no restrictions for this procedure. Prescriptions: SIMVASTATIN 40 MG TABS (SIMVASTATIN) Take 1 tablet  by mouth at bedtime  #90 x 4   Entered by:   Meredith Staggers, RN   Authorized by:   Dolores Patty, MD, [facility]   Signed by:   Meredith Staggers, RN on 03/15/2010   Method used:   Faxed to ...       [facility] (mail-order)             , [place]         Ph: [phone]       Fax: [phone]   RxID:   [id number] COREG 25 MG  TABS (CARVEDILOL) Take one tablet by mouth twice daily.  #180 x 4   Entered by:   Meredith Staggers, RN   Authorized by:   Dolores Patty, MD, [facility]   Signed by:   Meredith Staggers, RN on 03/15/2010   Method used:   Faxed to ...       [facility] (  mail-order)             , [place]         Ph: [phone]       Fax: [phone]   RxID:   [id number] DIOVAN 80 MG  TABS (VALSARTAN) 1 by mouth bid  #180 x 4   Entered by:   Meredith Staggers, RN   Authorized by:   Dolores Patty, MD, [facility]   Signed by:   Meredith Staggers, RN on 03/15/2010   Method used:   Faxed to ...       [facility] (mail-order)             , [place]         Ph: [phone]       Fax: [phone]   RxID:   [id number]    Orders Added: 1)  EKG w/ Interpretation [93000] 2)  T-2 View CXR [71020TC] 3)  Echocardiogram [Echo] 4)  TLB-BMP (Basic Metabolic Panel-BMET) [80048-METABOL] 5)  TLB-BNP (B-Natriuretic Peptide) [83880-BNPR]

## 2010-03-28 ENCOUNTER — Ambulatory Visit (HOSPITAL_COMMUNITY): Payer: Medicare Other | Attending: Internal Medicine

## 2010-03-28 DIAGNOSIS — I2510 Atherosclerotic heart disease of native coronary artery without angina pectoris: Secondary | ICD-10-CM

## 2010-03-28 DIAGNOSIS — I10 Essential (primary) hypertension: Secondary | ICD-10-CM | POA: Insufficient documentation

## 2010-03-28 DIAGNOSIS — I079 Rheumatic tricuspid valve disease, unspecified: Secondary | ICD-10-CM | POA: Insufficient documentation

## 2010-03-28 DIAGNOSIS — E785 Hyperlipidemia, unspecified: Secondary | ICD-10-CM | POA: Insufficient documentation

## 2010-04-18 ENCOUNTER — Encounter (INDEPENDENT_AMBULATORY_CARE_PROVIDER_SITE_OTHER): Payer: Medicare Other

## 2010-04-18 ENCOUNTER — Encounter: Payer: Self-pay | Admitting: Internal Medicine

## 2010-04-18 DIAGNOSIS — I428 Other cardiomyopathies: Secondary | ICD-10-CM

## 2010-04-19 ENCOUNTER — Encounter: Payer: Self-pay | Admitting: Internal Medicine

## 2010-04-25 ENCOUNTER — Encounter: Payer: Self-pay | Admitting: *Deleted

## 2010-04-30 NOTE — Cardiovascular Report (Signed)
Summary: Office Visit   Office Visit   Imported By: Roderic Ovens 04/25/2010 16:27:41  _____________________________________________________________________  External Attachment:    Type:   Image     Comment:   External Document

## 2010-04-30 NOTE — Letter (Signed)
Summary: Remote Device Check  [facility], Main Office  [address]   [place], [place] [address]   Phone: [phone]  Fax: [phone]     April 25, 2010 MRN: [id number]   Joseph Malone [address] [place], [place]  [address]   Dear [name],   Your remote transmission was recieved and reviewed by your physician.  All diagnostics were within normal limits for you.  _X____Your next transmission is scheduled for: 07/18/10.   Please transmit at any time this day.  If you have a wireless device your transmission will be sent automatically.  ______Your next office visit is scheduled for:                              . Please call our office to schedule an appointment.    Sincerely,  Altha Harm, LPN

## 2010-06-25 NOTE — Assessment & Plan Note (Signed)
HEALTHCARE                            CARDIOLOGY OFFICE NOTE   Joseph, Malone                       MRN:          [id number]  DATE:04/14/2007                            DOB:          25-Feb-1930    PRIMARY CARE PHYSICIAN:  Dr. Lona Kettle.   INTERVAL HISTORY:  Joseph Malone is a delightful 77 year old male with a history  of coronary artery disease status post large anterior MI with coronary  bypass grafting in 2001 with a LIMA to the LAD.  He has a history of  congestive heart failure secondary to ischemic cardiomyopathy, ejection  fraction has ranged between 35 and 40%.  He is status post ICD.  He had  a nuclear study in January 2008 which showed an EF of 38% with a large  anterior infarction but no ischemia.  The rest of his medical history is  notable for obesity, hypertension, hyperlipidemia, and glucose  intolerance.   He returns today for routine follow-up.  He is doing well.  He continues  to have a little bit of chest soreness at his sternotomy site, but no  angina.  He has not been exercising as much as he had in the past, but  says he otherwise feels fine.  Denies significant exertional dyspnea.  No orthopnea or PND.  No lower extremity edema.  Has been compliant with  his medications.   CURRENT MEDICATIONS:  1. Aspirin 325.  2. Multivitamin.  3. Folic acid.  4. Fish oil 1200 a day.  5. Zocor 40 a day.  6. Diovan 80 b.i.d.  7. Glucosamine.  8. Coreg 50 mg b.i.d.   PHYSICAL EXAM:  He is well-appearing in no acute distress.  Ambulates  around the clinic without respiratory difficulty.  Blood pressure is 116/72, heart rate 68, weight is 236.  HEENT is normal.  Neck is supple.  No JVD.  Carotids are 2+ bilaterally without bruits.  There is no lymphadenopathy or thyromegaly.  CARDIAC:  PMI is nondisplaced.  Regular rate and rhythm.  No murmurs,  rubs or gallops.  LUNGS:  Clear.  Abdomen is obese, nontender, nondistended, no  hepatosplenomegaly, no  bruits, no masses.  Good bowel sounds.  EXTREMITIES:  Warm with no cyanosis, clubbing or edema.  No rash.  NEURO:  He is alert and oriented x3.  Cranial nerves 2-12 are intact.  Moves all 4 extremities without difficulty.  Affect is very pleasant.   EKG shows normal sinus rhythm at a rate of 66 with a previous septal  infarct and nonspecific T-wave flattening laterally.   ASSESSMENT/PLAN:  1. Coronary artery disease.  He is status post a myocardial      infarction.  He is doing well.  No evidence of ischemia.  2. Hypertension.  Blood pressure well-controlled.  Continue current      therapy.  3. Congestive heart failure secondary to ischemic cardiomyopathy.  He      is doing well.  He is euvolemic.  NYHA class II.  He is stable.      Continue current therapy.  4. Hyperlipidemia.  This is  followed by Dr. Alwyn Ren.  Goal LDL is less      than 70.  I asked him to make sure I get a copy of his results.   DISPOSITION:  Will see him back in 6 months for routine follow-up.     Bevelyn Buckles. Bensimhon, MD  Electronically Signed    DRB/MedQ  DD: 04/14/2007  DT: 04/14/2007  Job #: [id number]   cc:   Titus Dubin. Alwyn Ren, MD,FACP,FCCP

## 2010-06-25 NOTE — Assessment & Plan Note (Signed)
[facility]                            CARDIOLOGY OFFICE NOTE   Malone, Joseph Malone                       MRN:          [id number]  DATE:09/15/2006                            DOB:          1930/08/23    PRIMARY CARE PHYSICIAN:  Titus Dubin. Alwyn Ren, M.D.   INTERVAL HISTORY:  Joseph Malone is a delightful 77 year old male with a  history of coronary artery disease, status post large anterior MI with  coronary artery bypass in 2001 with a LIMA to the LAD.  He also has a  history of congestive heart failure secondary to ischemic cardiomyopathy  with EF between 35 and 40%.  He is status post ICD.  The rest of his  medical history is notable for obesity, hypertension, hyperlipidemia and  glucose intolerance.   He returns today for routine followup.  He is doing well.  Over the past  few months he did have an upper respiratory tract infection and it kind  of derailed him from his exercise training program.  He is now getting  back to his walking but he does note it is a little bit harder to get  started.  He does have some occasional chronic chest wall discomfort but  no angina.  No orthopnea, PND or lower extremity edema.  He has been  compliant with all of his medications.   MEDICATIONS:  1. Aspirin 325 mg daily.  2. Multivitamin.  3. Folic acid.  4. Fish oil 1200 daily.  5. Zocor 40 mg daily.  6. Diovan 80 b.i.d.  7. Coreg 37.5 mg b.i.d.  8. Glucosamine.   PHYSICAL EXAMINATION:  GENERAL APPEARANCE:  Well-appearing in no acute  distress.  Ambulates around the clinic without any respiratory  difficulty.  VITAL SIGNS:  Blood pressure is 128/86, heart rate is 69.  Weight is 256  which is up 7 pounds.  HEENT: Normal.  NECK:  Supple.  No JVD.  Carotids are 2+ bilaterally without bruits.  There is no lymphadenopathy or thyromegaly.  CARDIAC:  Regular rate and rhythm, no murmurs, rubs or gallops.  LUNGS:  Clear.  ABDOMEN: Obese, nontender, nondistended.   No hepatosplenomegaly.  No  bruits.  No masses.  Good bowel sounds.  EXTREMITIES:  Warm with no cyanosis, clubbing or edema.  NEUROLOGICAL:  He is alert and oriented x3.  Cranial nerves II-XII are  intact.  He moves all four extremities without difficulty.  Affect is  very pleasant.   CLINICAL DATA:  EKG shows  normal sinus rhythm at a rate of 69 with  minimal T wave inversion in the high lateral wall.   ASSESSMENT/PLAN:  1. Coronary artery disease, status post previous anterior myocardial      infarction which is stable.  No evidence of ischemia.  Continue      current medical therapy.  2. Hypertension.  Blood pressure is mildly elevated.  We are      increasing his Coreg to 50 mg b.i.d.  3. Congestive heart failure secondary to ischemic cardiomyopathy.  He      is doing well.  NYHA class  2 symptoms.  He is on good medical      regimen.  Will titrate Coreg up to 50 mg b.i.d. as he can tolerate.  4. Hyperlipidemia.  Lipids are at goal.  He will follow up with Dr.      Alwyn Ren.  5. Prediabetes/glucose intolerance.  This is followed by Dr. Alwyn Ren.   DISPOSITION:  We will see him back in clinic in six months for routine  followup.  We will check his ICD today.     Bevelyn Buckles. Bensimhon, MD  Electronically Signed    DRB/MedQ  DD: 09/15/2006  DT: 09/15/2006  Job #: [id number]   cc:   Titus Dubin. Alwyn Ren, MD,FACP,FCCP

## 2010-06-25 NOTE — Assessment & Plan Note (Signed)
[facility]                         ELECTROPHYSIOLOGY OFFICE NOTE   ALFREDO, SPONG                       MRN:          [id number]  DATE:01/12/2007                            DOB:          February 17, 1930    Mr. Cokley returns today for followup.  He is a very pleasant elderly  male with history of ischemic cardiomyopathy and congestive heart  failure.  He has recently been losing weight, going on a low-  carbohydrate diet.  Otherwise, he had no specific complaints today  except that he gets short of breath when he exerts himself.  He denies  chest pain.  He denies peripheral edema.  He has gone back to  exercising, having taken about a 13-month break.   PHYSICAL EXAMINATION:  GENERAL:  He is a pleasant, elderly man in no  distress.  VITAL SIGNS:  Blood pressure 122/78, pulse 68 and regular, respirations  18. Weight 242 pounds.  NECK:  No jugular venous distention.  LUNGS:  Clear bilaterally to auscultation.  No wheezes, rales, or  rhonchi present.  CARDIOVASCULAR:  Regular rate and rhythm with normal S1 and S2.  EXTREMITIES:  Demonstrate no edema.   MEDICATIONS:  1. Aspirin 325 mg a day.  2. Multivitamins.  3. Zocor 40 a day.  4. Diovan 80 twice daily.  5. Coreg 50 mg twice daily.  6. Glucovance.   Interrogation of his defibrillator demonstrates a Medtronic Maximo,  [employer].  The R waves are 7; the impedance 576 with threshold voltage  0.2.  Battery voltage 3.13 volts.   IMPRESSION:  1. Ischemic cardiomyopathy.  2. Congestive heart failure.  3. Status post implantable cardioverter-defibrillator insertion.   DISCUSSION:  Overall, Mr. Wolford is stable, and his defibrillator is  working normally.  We will see him back in the office in 1 year.  He  will followup at our [facility] program.     Doylene Canning. Ladona Ridgel, MD  Electronically Signed    GWT/MedQ  DD: 01/12/2007  DT: 01/12/2007  Job #: [id number]

## 2010-06-25 NOTE — Assessment & Plan Note (Signed)
[facility]                         ELECTROPHYSIOLOGY OFFICE NOTE   EVANS, LEVEE                       MRN:          [id number]  DATE:01/25/2008                            DOB:          06/23/1930    Mr. Simones returns today for followup.  He is a very pleasant male with  a history of ischemic cardiomyopathy and congestive heart failure.  He  returns today for followup.  He denies chest pain.  He denies shortness  of breath.  He noted that the previously he was quite weak when he  walked but has Coreg decreased and his symptoms of shortness of breath  and weakness are improved.  Otherwise, he has been stable.  He denies  any intercurrent IC therapies.   MEDICINES:  1. Aspirin 325 a day.  2. Multivitamin 1 tablet a day.  3. Folic acid 400 a day.  4. Fish oil 1200 daily.  5. Simvastatin 40 a day.  6. Glucosamine 300 twice a day.  7. Coreg 25 mg daily.   PHYSICAL EXAMINATION:  GENERAL:  He is a pleasant elderly man in no  distress.  VITAL SIGNS:  Blood pressure 128/62, pulse 67 and regular, respirations  were 18, the weight was 246 pounds.  NECK:  No jugular venous distention.  LUNGS:  Clear bilaterally to auscultation.  No wheezes, rales, or  rhonchi are present.  There is no increased work of breathing.  CARDIOVASCULAR:  Regular rate and rhythm.  Normal S1 and S2.  There are  no murmurs or gallops.  ABDOMEN:  Soft, nontender.  EXTREMITIES:  No edema.   Interrogation of his defibrillator demonstrates Medtronic maximal.  R-  waves were 70, the impedance 656, the threshold of 1 volt at 0.2.  the  battery voltage was 3.1 volts.  Underlying rhythm was sinus.   IMPRESSION:  1. Ischemic cardiomyopathy.  2. Congestive heart failure.  3. Status post anterior myocardial infarction.  4. Status post implantable cardioverter-defibrillator insertion.   DISCUSSION:  Overall, Mr. Hlavacek is stable.  His defibrillator is  working normally.  His  heart failure is well controlled.  I have  strongly encouraged him to go back to regular exercise program and walk  every day.  He does state that he will try particularly once the first  of the year arise.     Doylene Canning. Ladona Ridgel, MD  Electronically Signed    GWT/MedQ  DD: 01/25/2008  DT: 01/26/2008  Job #: [id number]

## 2010-06-25 NOTE — Assessment & Plan Note (Signed)
[facility]                            CARDIOLOGY OFFICE NOTE   KORBIN, MAPPS                       MRN:          [id number]  DATE:11/29/2007                            DOB:          [birth year]    PRIMARY CARE PHYSICIAN:  Titus Dubin. Alwyn Ren, MD, FACP, [facility]   INTERVAL HISTORY:  Joseph Malone is a delightful 77 year old male with a history  of coronary artery disease status post large anterior MI with coronary  bypass grafting in 2001 with a LIMA to the LAD.  He has history of  congestive heart failure secondary to resultant ischemic cardiomyopathy,  ejection fraction however is in the 35-40% range.  He is status post  single-chamber ICD.  He did have a nuclear study in March 01, 2006,  which showed an EF of 38% a large anterior infarction, but no ischemia.  The rest of his medical history is notable for obesity, hypertension,  hyperlipidemia, and glucose intolerance.   He returns today for team followup.  He is doing well.  He continues to  have a little bit of chest soreness at the sternotomy site, but no  angina.  He has not been exercising as much as he has been in the past.  He did note that the Coreg was making him feel quite run down and  fatigued in the morning, so he cut his dose back from 50 b.i.d. to 25 in  the morning and 50 at night.  He feels much better.  He has not had any  orthopnea, no PND, no lower extremity edema.  He does get a little bit  short of breath when he exerts himself vigorously.   He has been complaining of a little bit of oily stools.  This has been  going on for about 3 months.  It is intermittent.  He does have some  leakage at times.  He has not had any problems with his medications.   CURRENT MEDICATIONS:  1. Aspirin 325 a day.  2. Multivitamin.  3. Folic acid.  4. Fish oil 1200 a day.  5. Simvastatin 40 a day.  6. Diovan 80 b.i.d.  7. Glucosamine.  8. Coreg 25 in the morning and 50 at night.   PHYSICAL  EXAMINATION:  GENERAL:  He is well appearing in no acute  distress, ambulates around the clinic without any respiratory  difficulty.  VITAL SIGNS:  Blood pressure is 106/76, heart rate is 61, weight is 246.  HEENT:  Normal.  NECK:  Supple.  No JVD.  Carotids are 2+ bilaterally without any bruits.  There is no lymphadenopathy or thyromegaly.  CARDIAC:  PMI is laterally displaced.  He has a regular rate and rhythm.  No murmurs, rubs, or gallops.  LUNGS:  Clear.  ABDOMEN:  Obese, nontender, nondistended.  No hepatosplenomegaly, no  bruits, no masses.  Good bowel sounds.  EXTREMITIES:  Warm with no cyanosis, clubbing, or edema.  No rash.  NEURO:  Alert and oriented x3.  Cranial nerves II through XII intact.  Moves all 4 extremities without difficulty.  PSYCHIATRY:  Affect is  pleasant.   EKG shows normal sinus rhythm with a previous septal infarct, just  nonspecific ST wave abnormalities.   ASSESSMENT AND PLAN:  1. Coronary artery disease status post previous anterior myocardial      infarction.  This is stable.  No evidence of ischemia.  Continue      current therapy.  2. Congestive heart failure secondary to ischemic cardiomyopathy.  He      is stable New York Heart Association class II.  He is doing well.      He is on good medications.  Continue current therapy.  3. Hyperlipidemia.  He is followed by Dr. Alwyn Ren.  Goal LDL is less      than 70.  4. Hypertension, well controlled.  5. Fatty stools.  I am concerned over possible malabsorption syndrome      or possible pancreatic insufficiency; however, his weight has been      stable.  We will send quantitative fecal fat.  I discussed briefly      with Dr. Leone Payor.  We will set him up for followup in GI Clinic      with the next available.   DISPOSITION:  We will see him back in 6 months for routine followup.     Bevelyn Buckles. Bensimhon, MD  Electronically Signed    DRB/MedQ  DD: 11/29/2007  DT: 11/30/2007  Job #: [id number]

## 2010-06-28 NOTE — Op Note (Signed)
NAMEQUINDELL, SHERE NO.:  [id number]   MEDICAL RECORD NO.:  [id number]                   PATIENT TYPE:  INP   LOCATION:  2899                                 FACILITY:  [facility]   PHYSICIAN:  Doylene Canning. Ladona Ridgel, M.D.               DATE OF BIRTH:  Sep 26, 1930   DATE OF PROCEDURE:  09/20/2003  DATE OF DISCHARGE:                                 OPERATIVE REPORT   PROCEDURE PERFORMED:  Implantation of a single chamber defibrillator.   INDICATIONS FOR PROCEDURE:  Ischemic cardiomyopathy, class 2 heart failure  with an ejection fraction of 30% status post myocardial infarction in the  remote past.   INTRODUCTION:  The patient is a 77 year old man who was referred by Jesse Sans. Wall, M.D. for consideration for implantable cardioverter-defibrillator  insertion.  He is status post myocardial infarction several years ago.  He  has never had syncope but does have an ejection fraction of 25 to 30%  associated with class 2 congestive heart failure.  He is now referred for  implantable cardioverter-defibrillator insertion.   DESCRIPTION OF PROCEDURE:  After informed consent was obtained, the patient  was taken to the diagnostic electrophysiology laboratory in a fasting state.  After the usual preparation and draping, intravenous fentanyl and Midazolam  were given for sedation.  A total of 30 mL of lidocaine was infiltrated into  the left infraclavicular region. A 9 cm incision was carried out over this  region and electrocautery utilized to dissect down to the fascial plane.  10  mL of contrast injected into the left upper extremity venous system and it  was subsequently punctured with the Medtronic model 6949 65 cm  defibrillation lead, serial number [id number] advanced into the right  ventricle.  Mapping was carried out in the right ventricle with the  defibrillation lead and at the final site in the RV apex.  The R-waves  measured 11 mV and the pacing threshold 0.9  V at 0.5 msec.  The pacing  impedance was initially 1120 ohms once the lead was actively fixed.  10 V  pacing did not stimulate the diaphragm.  With the defibrillation in  satisfactory position, it was secured to the subpectoralis fascia with a  figure-of-eight silk suture.  In addition, the sewing sleeve was secured  with silk suture.  Electrocautery was then utilized to make a subcutaneous  pocket.  Kanamycin irrigation was utilized to irrigate the pocket and  electrocautery was utilized to assure hemostasis.  The Medtronic Maximal VR  model 7232 single chamber defibrillator, serial [id number] was connected to  the defibrillation lead and placed in the subcutaneous pocket.  The  generator was secured with a silk suture.  Additional Kanamycin was utilized  to irrigate the pocket and defibrillation threshold testing carried out.   After the patient was more deeply sedated with fentanyl and Versed, VF was  induced with a T-wave  shock.  A 15 joule shock was then delivered  terminating ventricular fibrillation and restoring sinus rhythm.  Five  minutes was allowed to elapse and a second defibrillation threshold test  carried out.  Again VF was induced with a T-wave shock.  At this time a 10-  joule shock was delivered which failed to terminate ventricular  fibrillation.  A second 20 joule shock was then delivered through the device  terminating ventricular fibrillation restoring sinus rhythm.  No additional  defibrillation threshold testing was carried out.  The incision was closed  with a layer of 2-0 Vicryl, followed by a layer of 3-0 Vicryl, followed by a  layer of 4-0 Vicryl.  Benzoin was painted on the skin.  Steri-Strips were  applied.  Pressure dressing placed and the patient returned to his room in  satisfactory condition.   COMPLICATIONS:  There were no immediate procedure complications.   RESULTS:  This demonstrates successful insertion of a Medtronic single  chamber  defibrillator in a patient with ischemic cardiomyopathy and a  history of prior myocardial infarction, class 2 heart failure and ejection  fraction of 25 to 30%.                                               Doylene Canning. Ladona Ridgel, M.D.    GWT/MEDQ  D:  09/20/2003  T:  09/20/2003  Job:  [id number]   cc:   Thomas C. Wall, M.D.   Titus Dubin. Alwyn Ren, M.D. [facility]

## 2010-06-28 NOTE — Discharge Summary (Signed)
NAME:  Joseph Malone, Joseph Malone                          ACCOUNT NO.:  [id number]   MEDICAL RECORD NO.:  [id number]                   PATIENT TYPE:  INP   LOCATION:  3730                                 FACILITY:  [facility]   PHYSICIAN:  Doylene Canning. Ladona Ridgel, M.D.               DATE OF BIRTH:  1930-12-13   DATE OF ADMISSION:  09/20/2003  DATE OF DISCHARGE:  09/21/2003                                 DISCHARGE SUMMARY   DISCHARGE DIAGNOSES:  1.  Ischemic cardiomyopathy, ejection fraction 25-30%.  2.  Class II congestive heart failure symptoms in the heart failure action      study.  3.  Candidate for implantable cardioverter defibrillator per MADIT II      protocol.   SECONDARY DIAGNOSES:  1.  History of myocardial infarction status post coronary artery bypass      graft surgery 2001.  2.  Hypertension.  3.  Obesity.  4.  Dyslipidemia.  5.  Strong family history of coronary artery disease.   Cardiolite study September 13, 2002, ejection fraction 32%, evidence of  significant old anterior anteroseptal and apical scarring.  There is only  slight periinfarct ischemia which is not a marked finding.  Procedure September 20, 2003, implantation of Medtronic Maximo VR, model 7232 single chamber  defibrillator, serial [id number].  Patient tolerated the procedure well and  had no postprocedure complications at [facility].  Interrogation on  postprocedure day #1 showed that the defibrillator was working  appropriately.  Patient goes home with the following medications:  1.  Coreg 12.5 mg twice daily.  2.  Diovan 160 mg daily.  3.  Multivitamin daily.  4.  Zocor 20 mg 1/2 tab daily at bedtime.  5.  Enteric coated aspirin 325 mg daily.  6.  Folic acid 1 mg daily.  7.  Hydrochlorothiazide 12.5 mg daily.  8.  Nitroglycerin 0.4 mg 1 tab under the tongue every 5 minutes x3 as needed      for chest pain.  9.  For pain of the pacer at the defibrillator site, Tylenol 325 mg 1-2 tabs      q.4-6 hours p.r.n.  pain.   Patient has been given an activity sheet for postprocedure instruction.  He  will have an office visit with pacer clinic Monday, October 02, 2003, at  10:30 in the morning and he will see again Dr. Ladona Ridgel on December 27, 2003,  at 11:40 in the morning.   BRIEF HISTORY:  Mr. Keehan is a 77 year old male with a history of coronary  artery disease.  He is status post myocardial infarction and subsequent to  that underwent coronary artery bypass graft surgery.  Echocardiogram in 2004  showed ejection fraction 25-30%.  The patient has Class II heart failure and  is presently in the heart failure ACTION study.  Patient has never had  syncope.  He does have occasional chest pain and  most recently describes an  episode of chest pain occurring after doing strenuous exertion with upper  body and arms.  He is able to walk reasonably well although he gets tired  with walking on any incline.  The patient is a candidate for cardioverter  defibrillator implantation per the MADIT II study.  The risks and benefits  of this implantation procedure have been discussed with the patient and he  is willing to proceed.  This will be done on an elective basis September 20, 2003, by Dr. Lewayne Bunting.   HOSPITAL COURSE:  Is as described above with successful implantation of a  cardioverter defibrillator and no postprocedure complications.      Maple Mirza, P.A.                    Doylene Canning. Ladona Ridgel, M.D.    GM/MEDQ  D:  10/17/2003  T:  10/17/2003  Job:  [id number]   cc:   Doylene Canning. Ladona Ridgel, M.D.   Titus Dubin. Alwyn Ren, M.D. [facility]   Maisie Fus C. Wall, M.D.

## 2010-06-28 NOTE — Assessment & Plan Note (Signed)
[facility]                            CARDIOLOGY OFFICE NOTE   Joseph Malone                       MRN:          [id number]  DATE:03/18/2006                            DOB:          [birth year]    PRIMARY CARE PHYSICIAN:  Dr. Lona Kettle.   INTERVAL HISTORY:  Joseph Malone is delightful 77 year old male who returns  today for routine followup.   PAST MEDICAL HISTORY:  Coronary artery disease, status post large  anterior MI.  Status post coronary bypass in 2001 with LIMA to the LAD  as well as congestive heart failure secondary to ischemic cardiomyopathy  and EF of approximately 35%.  At his last visit he was having some  shortness of breath which was a bit unusual for him.  We sent him for a  Myoview, showed an EF of 38% with an anterior scar but no ischemia.  Since that time he has been somewhat more active and his breathing feels  much better.  He continues to have some chronic chest wall pain but no  angina.  No orthopnea, PND, or lower extremity edema.   For complete details, please see my note of August 15, 2005.   MEDICATIONS:  1. Aspirin 325 mg a day.  2. Multivitamin.  3. Folic acid 400 mg a day.  4. Fish oil 1200 mg a day.  5. Zocor 40 mg a day.  6. Diovan 80 mg b.i.d.  7. Coreg 37.5 mg b.i.d.  8. Glucosamine.   PHYSICAL EXAMINATION:  He is well-appearing in no apparent distress.  Respirations are unlabored.  Blood pressure is 122/68, heart rate is 73,  weight is 249.  HEENT:  Sclerae anicteric, EOMI, there are no xanthelasmas, mucus  membranes are moist, oropharynx is clear.  NECK:  Supple, no JVD, carotids are 2+ bilaterally without any bruits,  there is no lymphadenopathy or thyromegaly.  CARDIAC:  Regular rate and rhythm, no murmurs, rubs, or gallops.  LUNGS:  Clear.  ABDOMEN:  Obese, nontender, nondistended, no hepatosplenomegaly, no  bruits, no masses, good bowel sounds.  EXTREMITIES:  Warm with no clubbing, cyanosis, or  edema.  NEUROLOGIC:  He is alert and oriented x3, cranial nerves II-XII are  intact, otherwise nonfocal.  Affect is very pleasant.   LABORATORY TESTS:  Show a total cholesterol of 125, triglycerides 101,  HDL 44, LDL 61, LFTs are normal.  Hemoglobin A1c is 6.1.   ASSESSMENT/PLAN:  1. Congestive heart failure secondary to ischemic cardiomyopathy,      ejection fraction is actually somewhat up on his last Myoview.  He      already has a defibrillator in place.  He is doing quite well, [place] Heart Association Class II symptoms. Continue current therapy.      When we see him again in June we can try and titrate his Coreg up      to 50 mg b.i.d., but this will set him back a little bit as he is      very sensitive to it.  2. Coronary artery disease, stable.  Continue current therapy, Myoview      is reassuring.  3. Hyperlipidemia.  His lipids are at goal.  4. Prediabetes/glucose intolerance.  This is followed by Dr. Alwyn Ren.      Hemoglobin A1c is 6.1.  May need to consider metformin in the near      future.   DISPOSITION:  We will see him back in 6 months with repeat lipids.     Bevelyn Buckles. Bensimhon, MD  Electronically Signed    DRB/MedQ  DD: 03/18/2006  DT: 03/18/2006  Job #: [id number]   cc:   Titus Dubin. Alwyn Ren, MD,FACP,FCCP

## 2010-06-28 NOTE — Assessment & Plan Note (Signed)
[facility]                            CARDIOLOGY OFFICE NOTE   Malone, Joseph Malone                       MRN:          [id number]  DATE:02/17/2006                            DOB:          1930-05-07    PRIMARY CARE PHYSICIAN:  Titus Dubin. Alwyn Ren, MD,FACP,FCCP.   PATIENT IDENTIFICATION:  Joseph Malone is a delightful, 77 year old male  who returns today for routine f/u.   PROBLEM LIST:  This is outlined in my note of August 15, 2005 and includes:   PMHx includes coronary artery disease status post single-vessel bypass  in July 2001 with the LIMA to the LAD as well as congestive heart  failure secondary to ischemic cardiomyopathy with an EF of 31%.   He had been doing quite well with NYHA class 1 symptoms; however, in  early December, he had a bout of lower abdominal pain which was worse  with eating lettuce and associated with significant cramping. There is  no bright red blood per rectum. No fevers or chills. This is resolved.  However he has continued to feel weak and he did have an episode of  hypotension and weakness with blood pressure in the 70s in late December  which he thought was due to dehydration and since that time he his  energy level has picked up some but he is not back to baseline. Blood  pressure has been much improved. He denies any anginal symptoms though  he does have chronic chest wall pain. He has not had any lower extremity  edema, orthopnea or PND. He does note that he has had dark stools for  about a year now and Dr. Alwyn Ren has given him stool cards which were  negative.   MEDICATIONS:  Unchanged from previous.   PHYSICAL EXAMINATION:  GENERAL:  He is well-appearing in no acute  distress.  VITAL SIGNS:  Blood pressure is 131/86, heart rate is 71.  HEENT:  Sclera anicteric, EOMI, there is no xanthelasma. Mucous  membranes are moist.  NECK:  Supple, no JVD. Carotids are 2+ bilaterally without any bruits.  There is no  lymphadenopathy or thyromegaly.  CARDIAC:  Regular rate and rhythm, no murmurs, rubs or gallops.  LUNGS:  Obese, nontender, nondistended, no hepatosplenomegaly, no  bruits, no masses.  EXTREMITIES:  Warm with no clubbing, cyanosis or edema.  NEUROLOGIC:  He is alert and oriented x3 and otherwise nonfocal.   EKG shows normal sinus rhythm at a rate of 71. There are anterior Q  waves, no significant ST-T wave abnormalities.   ASSESSMENT/PLAN:  Joseph Malone is doing relatively well from a congestive heart  failure point of view. I put him at NYHA class 2. However, I am a bit  concerned that his functional status is not what it used to be. I  suspect this may be related to his recent gastrointestinal illness but I  do think we need to rule out underlying ischemia. Will set him up for a  treadmill Cardiolite. If this is abnormal or his symptoms get any worse,  I think I would have a low threshold to  proceed with cardiac  catheterization.   DISPOSITION:  He will return in 4 weeks for followup.     Bevelyn Buckles. Bensimhon, MD  Electronically Signed    DRB/MedQ  DD: 02/17/2006  DT: 02/17/2006  Job #: [id number]   cc:   Titus Dubin. Alwyn Ren, MD,FACP,FCCP

## 2010-06-28 NOTE — Assessment & Plan Note (Signed)
[facility]                                 ON-CALL NOTE   Joseph Malone, Joseph Malone                       MRN:          [id number]  DATE:02/08/2006                            DOB:          [birth year]    The patient is seen by Dr. Gala Romney.  Mr. Curfman called in this  afternoon because, after taking his 25 mg Coreg this morning he felt a  little bit weak and fatigued.  He checked his blood pressure and noted  that, systolically, it was 77.  He , following arrival back home after  church, drank a lot of water, and his most recent blood pressure was 105  systolically.  He asks what he should do.  On further questioning, it  turns out he has been on Coreg for some time at this dose, and has  always tolerated it in the past.  I questioned if maybe he could have  been dehydrated.  He says he does not know, but says it could be a  possibility.  I advised to check his blood pressure prior to taking his  evening dose of Coreg, and that if his systolic blood pressure is less  than 100, that he should hold the dose and call us back in the morning.  He is also to take his blood pressure prior to any medication tomorrow  morning.  He verbalized understanding and has followup with Dr.  Gala Romney next week.  I advised that, if he ends up having to hold any  medications, that he should call us sooner rather than later for  possible readjustment and down-titration.  He verbalized understanding.      Nicolasa Ducking, ANP  Electronically Signed      Bevelyn Buckles. Bensimhon, MD  Electronically Signed   CB/MedQ  DD: 02/08/2006  DT: 02/08/2006  Job #: [id number]

## 2010-07-18 ENCOUNTER — Ambulatory Visit (INDEPENDENT_AMBULATORY_CARE_PROVIDER_SITE_OTHER): Payer: Medicare Other | Admitting: *Deleted

## 2010-07-18 ENCOUNTER — Other Ambulatory Visit: Payer: Self-pay | Admitting: Internal Medicine

## 2010-07-18 DIAGNOSIS — I509 Heart failure, unspecified: Secondary | ICD-10-CM

## 2010-07-18 DIAGNOSIS — I428 Other cardiomyopathies: Secondary | ICD-10-CM

## 2010-07-25 NOTE — Progress Notes (Signed)
icd remote check  

## 2010-08-02 ENCOUNTER — Encounter: Payer: Self-pay | Admitting: *Deleted

## 2010-08-27 ENCOUNTER — Encounter: Payer: Self-pay | Admitting: Internal Medicine

## 2010-09-20 ENCOUNTER — Encounter: Payer: Self-pay | Admitting: Family Medicine

## 2010-09-20 ENCOUNTER — Ambulatory Visit (INDEPENDENT_AMBULATORY_CARE_PROVIDER_SITE_OTHER): Payer: Medicare Other | Admitting: Family Medicine

## 2010-09-20 VITALS — BP 100/60 | Temp 99.1°F | Ht 71.0 in | Wt 251.0 lb

## 2010-09-20 DIAGNOSIS — R059 Cough, unspecified: Secondary | ICD-10-CM

## 2010-09-20 DIAGNOSIS — R05 Cough: Secondary | ICD-10-CM | POA: Insufficient documentation

## 2010-09-20 MED ORDER — BENZONATATE 200 MG PO CAPS: 200.0000 mg | ORAL_CAPSULE | Freq: Three times a day (TID) | ORAL | Status: AC | PRN

## 2010-09-20 NOTE — Patient Instructions (Signed)
This appears to be a viral illness and should improve w/ time If no better by early to mid week- please call me back and we'll call you in an antibiotic Start Mucinex to thin your congestion Use the cough pills as needed- they will not make you sleepy and you can combine them if needed w/ cough syrup or other med Drink plenty of fluids REST! Hang in there!!!

## 2010-09-20 NOTE — Progress Notes (Signed)
  Subjective:    Patient ID: Joseph Malone, male    DOB: April 27, 1930, 77 y.o.   MRN: [id number]  HPI Cough- son recently had URI and pt subsequently developed sore throat, chest congestion.  Low grade temp this AM- 100.  + nasal congestion, HAs, no ear pain.  +cough- mildly productive.  Has tried OTC cough and cold med w/ relief of cough.  Also taking tylenol and ibuprofen.   Review of Systems For ROS see HPI     Objective:   Physical Exam  Constitutional: He appears well-developed and well-nourished. No distress.  HENT:  Head: Normocephalic and atraumatic.  Right Ear: Tympanic membrane normal.  Left Ear: Tympanic membrane normal.  Nose: No mucosal edema or rhinorrhea. Right sinus exhibits no maxillary sinus tenderness and no frontal sinus tenderness. Left sinus exhibits no maxillary sinus tenderness and no frontal sinus tenderness.  Mouth/Throat: Mucous membranes are normal. No oropharyngeal exudate, posterior oropharyngeal edema or posterior oropharyngeal erythema.       TMs normal bilaterally Minimal nasal congestion No TTP over sinuses  Eyes: Conjunctivae and EOM are normal. Pupils are equal, round, and reactive to light.  Neck: Normal range of motion. Neck supple.  Cardiovascular: Normal rate, regular rhythm and normal heart sounds.   Pulmonary/Chest: Breath sounds normal. No respiratory distress (no cough heard). He has no wheezes.  Lymphadenopathy:    He has no cervical adenopathy.  Skin: Skin is warm and dry.          Assessment & Plan:

## 2010-09-20 NOTE — Assessment & Plan Note (Signed)
Pt's sxs are most likely viral- no evidence of bacterial infxn on exam.  Reviewed supportive care and red flags that should prompt return.  Pt expressed understanding and is in agreement w/ plan.

## 2010-09-30 ENCOUNTER — Ambulatory Visit (INDEPENDENT_AMBULATORY_CARE_PROVIDER_SITE_OTHER): Payer: Medicare Other | Admitting: Internal Medicine

## 2010-09-30 ENCOUNTER — Encounter: Payer: Self-pay | Admitting: Internal Medicine

## 2010-09-30 VITALS — BP 114/80 | HR 73 | Ht 71.0 in | Wt 248.0 lb

## 2010-09-30 DIAGNOSIS — I2589 Other forms of chronic ischemic heart disease: Secondary | ICD-10-CM

## 2010-09-30 DIAGNOSIS — R0602 Shortness of breath: Secondary | ICD-10-CM

## 2010-09-30 DIAGNOSIS — I10 Essential (primary) hypertension: Secondary | ICD-10-CM

## 2010-09-30 DIAGNOSIS — I2510 Atherosclerotic heart disease of native coronary artery without angina pectoris: Secondary | ICD-10-CM

## 2010-09-30 NOTE — Assessment & Plan Note (Signed)
This is new for him. No overt HF. Will proceed with stress testing. Low threshold for R and L heart cath.

## 2010-09-30 NOTE — Progress Notes (Signed)
HPI: Joseph Malone  is a pleasant 77 year old male with a history of coronary artery disease status post large anterior MI with coronary  bypass grafting in 2001 with a LIMA to the LAD.  He has history of  congestive heart failure secondary to resultant ischemic cardiomyopathy,  ejection fraction however is in the 35-40% range.  He is status post  single-chamber ICD.  He did have a nuclear study in March 01, 2006, which showed an EF of 38% a large anterior infarction, but no ischemia. ETT 10/11 was normal (8:35min on [place])   The rest of his medical history is notable for obesity, hypertension,  hyperlipidemia, and glucose intolerance.  He denies any intercurrent ICD therapies.  Returns for routine f/u. Complains of dyspnea when going up hills and walking up stairs.  Unable to perform his regular walking exercise due to SOB. Denies chest pain or pressure. Has had a URI and wheeze for the last two weeks he was evaluated by Dr Beverely Low and he was treated with muccinex. Has chronic soreness in chest but no angina. He sleeps with HOB elevated.No lower extremity edema.  Denies PND or edema. No ICD firings. Checking BP at home with systolics 120s  . He does weigh most days and his weight has been 245. No snoring.    Will have complete physical by Dr Alwyn Ren October 25, 2010.     ROS: All systems negative except as listed in HPI, PMH and Problem List.  Past Medical History  Diagnosis Date  . CAD (coronary artery disease)     s/p CABG  . Heart attack 2001  . Heart failure     CHF due to ischemic CM  . Ischemic cardiomyopathy   . Excess weight   . Hypertension   . Hyperlipidemia   . Glucose intolerance (malabsorption)   . BPH (benign prostatic hypertrophy)   . Microscopic hematuria     Dr. Wanda Plump  . Diverticulosis   . Hx of colonoscopy     Current Outpatient Prescriptions  Medication Sig Dispense Refill  . aspirin 325 MG tablet Take 325 mg by mouth daily.        . carvedilol (COREG) 25 MG  tablet Take 25 mg by mouth 2 (two) times daily.        . Cholecalciferol (VITAMIN D3) 2000 UNITS TABS Take by mouth daily.        . Flaxseed, Linseed, (FLAX SEED OIL) 1000 MG CAPS Take by mouth.        . folic acid (FOLVITE) 400 MCG tablet Take 400 mcg by mouth daily.        Marland Kitchen glucosamine-chondroitin 500-400 MG tablet Take 1 tablet by mouth 2 (two) times daily.        Marland Kitchen glucose blood test strip 1 each by Other route as needed. Freestyle Lite, test once daily       . Lancet Device MISC by Does not apply route. Freestlyle [employer], once daily       . Multiple Vitamin (MULTIVITAMIN) tablet Take 1 tablet by mouth daily.        . sildenafil (VIAGRA) 100 MG tablet Take 100 mg by mouth as needed.        . simvastatin (ZOCOR) 40 MG tablet Take 40 mg by mouth at bedtime.        . valsartan (DIOVAN) 80 MG tablet Take 80 mg by mouth 2 (two) times daily.           PHYSICAL EXAM: Filed Vitals:  09/30/10 1526  BP: 114/80  Pulse: 73    Weight: 248 pounds General:  Well appearing. No resp difficulty HEENT: normal Neck: supple. JVP flat. Carotids 2+ bilaterally; no bruits. No lymphadenopathy or thryomegaly appreciated. Cor: PMI normal. Regular rate & rhythm. No rubs, gallops or murmurs. Lungs: clear Abdomen: soft, nontender, nondistended. No hepatosplenomegaly. No bruits or masses. Good bowel sounds. Extremities: no cyanosis, clubbing, rash, edema Neuro: alert & orientedx3, cranial nerves grossly intact. Moves all 4 extremities w/o difficulty. Affect pleasant.    ECG: Sinus 73. Anteroseptal Qs. Non-specific ST abnormality.   ASSESSMENT & PLAN:

## 2010-09-30 NOTE — Patient Instructions (Signed)
Labs today  Your physician has requested that you have en exercise stress myoview. For further information please visit [email or web address] Please follow instruction sheet, as given.  Your physician recommends that you schedule a follow-up appointment in: 2 months in Heart Failure [facility]

## 2010-09-30 NOTE — Assessment & Plan Note (Addendum)
Overall NYHA II-III. Though he has had a marked change in his functional capacity from previous. I am not sure why this is though I worry it may be ischemic in nature. His volume status looks good. Will proceed with ETT/Myoview to further evaluate. Check labs as well.

## 2010-09-30 NOTE — Assessment & Plan Note (Signed)
As above; proceed with Myoview.

## 2010-10-01 ENCOUNTER — Encounter: Payer: Self-pay | Admitting: *Deleted

## 2010-10-01 LAB — CBC WITH DIFFERENTIAL/PLATELET
Basophils Absolute: 0 10*3/uL (ref 0.0–0.1)
Basophils Relative: 0.3 % (ref 0.0–3.0)
Eosinophils Absolute: 0.3 10*3/uL (ref 0.0–0.7)
Eosinophils Relative: 3.3 % (ref 0.0–5.0)
HCT: 43.6 % (ref 39.0–52.0)
Hemoglobin: 14.6 g/dL (ref 13.0–17.0)
Lymphocytes Relative: 30.2 % (ref 12.0–46.0)
Lymphs Abs: 2.6 10*3/uL (ref 0.7–4.0)
MCHC: 33.6 g/dL (ref 30.0–36.0)
MCV: 94.1 fl (ref 78.0–100.0)
Monocytes Absolute: 0.6 10*3/uL (ref 0.1–1.0)
Monocytes Relative: 7.2 % (ref 3.0–12.0)
Neutro Abs: 5 10*3/uL (ref 1.4–7.7)
Neutrophils Relative %: 59 % (ref 43.0–77.0)
Platelets: 207 10*3/uL (ref 150.0–400.0)
RBC: 4.63 Mil/uL (ref 4.22–5.81)
RDW: 13 % (ref 11.5–14.6)
WBC: 8.4 10*3/uL (ref 4.5–10.5)

## 2010-10-01 LAB — TSH: TSH: 0.35 u[IU]/mL (ref 0.35–5.50)

## 2010-10-01 LAB — BASIC METABOLIC PANEL
CO2: 25 mEq/L (ref 19–32)
Calcium: 9.5 mg/dL (ref 8.4–10.5)
Creatinine, Ser: 1.2 mg/dL (ref 0.4–1.5)
GFR: 62.74 mL/min (ref >60.00)

## 2010-10-09 ENCOUNTER — Ambulatory Visit (HOSPITAL_COMMUNITY): Payer: Medicare Other | Attending: Internal Medicine | Admitting: Radiology

## 2010-10-09 DIAGNOSIS — I4949 Other premature depolarization: Secondary | ICD-10-CM

## 2010-10-09 DIAGNOSIS — R0989 Other specified symptoms and signs involving the circulatory and respiratory systems: Secondary | ICD-10-CM

## 2010-10-09 DIAGNOSIS — R079 Chest pain, unspecified: Secondary | ICD-10-CM

## 2010-10-09 DIAGNOSIS — I2589 Other forms of chronic ischemic heart disease: Secondary | ICD-10-CM

## 2010-10-09 DIAGNOSIS — I25810 Atherosclerosis of coronary artery bypass graft(s) without angina pectoris: Secondary | ICD-10-CM

## 2010-10-09 DIAGNOSIS — I2510 Atherosclerotic heart disease of native coronary artery without angina pectoris: Secondary | ICD-10-CM | POA: Insufficient documentation

## 2010-10-09 DIAGNOSIS — I259 Chronic ischemic heart disease, unspecified: Secondary | ICD-10-CM | POA: Insufficient documentation

## 2010-10-09 MED ORDER — TECHNETIUM TC 99M TETROFOSMIN IV KIT
11.0000 | PACK | Freq: Once | INTRAVENOUS | Status: AC | PRN
  Administered 2010-10-09: 11 via INTRAVENOUS

## 2010-10-09 MED ORDER — TECHNETIUM TC 99M TETROFOSMIN IV KIT
33.0000 | PACK | Freq: Once | INTRAVENOUS | Status: AC | PRN
  Administered 2010-10-09: 33 via INTRAVENOUS

## 2010-10-09 NOTE — Progress Notes (Addendum)
[facility] SITE 3 NUCLEAR MED [address] [place] [address] [phone]  Cardiology Nuclear Med Study  Joseph Malone is a 77 y.o. male [id number] [birth year]   Nuclear Med Background Indication for Stress Test:  Evaluation for Ischemia and Graft Patency History:  '01 AWMI>CABG; '05 AICD;'08 MPS: (-) ischemia,large anterior infarct, EF=38%;10/11GXT: NL; 2/12 Echo: Ef=25-30%, mild LVH Cardiac Risk Factors: Family History - CAD, Hypertension, Lipids, Glucose intolerance, and Obesity  Symptoms:  Chest Pain with chronic, continuous chest soreness per patient, DOE, Fatigue, Fatigue with Exertion and Rapid HR   Nuclear Pre-Procedure Caffeine/Decaff Intake:  None  NPO After: 9:00pm   Lungs:  Clear IV 0.9% NS with Angio Cath:  20g  IV Site: R Wrist  IV Started by:  Stanton Kidney, EMT-P  Chest Size (in):  46 Cup Size: n/a  Height: 5\' 11"  (1.803 m)  Weight:  250 lb (113.399 kg)  BMI:  Body mass index is 34.87 kg/(m^2). Tech Comments:  Coreg held 24 hours, per patient.    Nuclear Med Study 1 or 2 day study: 1 day  Stress Test Type:  Stress  Reading MD: Charlton Haws, MD  Order Authorizing Provider:  Arvilla Meres, MD  Resting Radionuclide: Technetium 50m Tetrofosmin  Resting Radionuclide Dose: 11.0 mCi   Stress Radionuclide:  Technetium 93m Tetrofosmin  Stress Radionuclide Dose: 33.0 mCi           Stress Protocol Rest HR: 69 Stress HR: 137  Rest BP: 127/97 Stress BP: 228/100  Exercise Time (min): 7:00 METS: 8.0   Predicted Max HR: 142 bpm % Max HR: 96.48 bpm Rate Pressure Product: 21308   Dose of Adenosine (mg):  n/a Dose of Lexiscan: n/a mg  Dose of Atropine (mg): n/a Dose of Dobutamine: n/a mcg/kg/min (at max HR)  Stress Test Technologist: Irean Hong, RN  Nuclear Technologist:  Doyne Keel, CNMT     Rest Procedure:  Myocardial perfusion imaging was performed at rest 45 minutes following the intravenous administration of Technetium 69m  Tetrofosmin. Rest ECG: NSR with poor R wave progression, nonspecific T wave changes, PVC  Stress Procedure:  The patient exercised for 7 minutes, RPE=15.  The patient stopped due to DOE and denied any chest pain. The patient complains of a chronic continuous chest soreness.  There were nonspecific ST-T wave changes.There was a hypertensive response to exercise.There was frequent PVC's.  Technetium 30m Tetrofosmin was injected at peak exercise and myocardial perfusion imaging was performed after a brief delay. Stress ECG: No significant change from baseline ECG  QPS Raw Data Images:  Normal; no motion artifact; normal heart/lung ratio. Stress Images:  There is decreased uptake in the anterior wall. Rest Images:  There is decreased uptake in the anterior wall. Subtraction (SDS):  There is a fixed defect that is most consistent with a previous infarction. Transient Ischemic Dilatation (Normal <1.22):  0.90 Lung/Heart Ratio (Normal <0.45):  0.37  Quantitative Gated Spect Images QGS EDV:  143 ml QGS ESV:  92 ml QGS cine images:  Anteroapical akinesis QGS EF: 36%  Impression Exercise Capacity:  Fair exercise capacity. BP Response:  Hypertensive blood pressure response. Clinical Symptoms:  There is dyspnea. ECG Impression:  No significant ST segment change suggestive of ischemia. Comparison with Prior Nuclear Study: No images to compare  Overall Impression:  Large anteroapical wall infarct with no ischemia.        Charlton Haws   No ischemia. Continue current therapy.   Daniel Bensimhon

## 2010-10-16 ENCOUNTER — Telehealth (HOSPITAL_COMMUNITY): Payer: Self-pay | Admitting: *Deleted

## 2010-10-16 NOTE — Telephone Encounter (Signed)
Left pt mess to call back for myoview and lipid results

## 2010-10-17 NOTE — Telephone Encounter (Signed)
The patient called the office today and was sent directly to my extension. I gave him his myoview & lab results. I will forward to Meredith Staggers, RN.

## 2010-10-22 ENCOUNTER — Ambulatory Visit (INDEPENDENT_AMBULATORY_CARE_PROVIDER_SITE_OTHER): Payer: Medicare Other | Admitting: Internal Medicine

## 2010-10-22 ENCOUNTER — Encounter: Payer: Self-pay | Admitting: Internal Medicine

## 2010-10-22 DIAGNOSIS — R946 Abnormal results of thyroid function studies: Secondary | ICD-10-CM

## 2010-10-22 DIAGNOSIS — I10 Essential (primary) hypertension: Secondary | ICD-10-CM

## 2010-10-22 DIAGNOSIS — E785 Hyperlipidemia, unspecified: Secondary | ICD-10-CM

## 2010-10-22 DIAGNOSIS — I2510 Atherosclerotic heart disease of native coronary artery without angina pectoris: Secondary | ICD-10-CM

## 2010-10-22 DIAGNOSIS — R7309 Other abnormal glucose: Secondary | ICD-10-CM

## 2010-10-22 DIAGNOSIS — Z0000 Encounter for general adult medical examination without abnormal findings: Secondary | ICD-10-CM

## 2010-10-22 LAB — LIPID PANEL
Cholesterol: 130 mg/dL (ref 0–200)
HDL: 47.8 mg/dL (ref >39.00)
LDL Cholesterol: 57 mg/dL (ref 0–99)
VLDL: 24.8 mg/dL (ref 0.0–40.0)

## 2010-10-22 LAB — HEMOGLOBIN A1C: Hgb A1c MFr Bld: 6.3 % (ref 4.6–6.5)

## 2010-10-22 LAB — HEPATIC FUNCTION PANEL: Bilirubin, Direct: 0 mg/dL (ref 0.0–0.3)

## 2010-10-22 NOTE — Patient Instructions (Signed)
Preventive Health Care: Exercise at least 30-45 minutes a day,  3-4 days a week.  Eat a low-fat diet with lots of fruits and vegetables, up to 7-9 servings per day. Consume less than 40 grams of sugar per day from foods & drinks with High Fructose Corn Sugar as # 1,2,3 or # 4 on label. Health Care Power of Attorney & Living Will. Complete if not in place ; these place you in charge of your health care decisions. 

## 2010-10-22 NOTE — Progress Notes (Signed)
Subjective:    Patient ID: Joseph Malone, male    DOB: Jun 30, 1930, 77 y.o.   MRN: [id number]  HPI Medicare Wellness Visit:  The following psychosocial & medical history were reviewed as required by Medicare.   Social history: caffeine: minimal , alcohol:  no ,  tobacco use : never  & exercise : [employer] & walking 2-3X/ week 30-60 min.   Home & personal  safety / fall risk: no issues, activities of daily living: no limitations , seatbelt use : yes , and smoke alarm employment : yes .  Power of Attorney/Living Will status : needed  Vision ( as recorded per Nurse) & Hearing  evaluation :  Last Ophth exam 06/2010: negative; whisper heard @ 6 ft. Orientation :oriented X3 , memory & recall :good,  math testing: good,and mood & affect : normal . Depression / anxiety: denied  Travel history : 2002 [place] , immunization status :Shingles needed , transfusion history:  no, and preventive health surveillance ( colonoscopies, BMD , etc as per protocol/ SOC): up to date, Dental care:  Every 6 mos . Chart reviewed &  Updated. Active issues reviewed & addressed.       Review of Systems #1 HYPERTENSION: Disease Monitoring: Blood pressure range-average 120/75  Chest pain, palpitations- some exertional cp ; neg Nuclear Stress Test 8/28 by [facility]       Dyspnea- uphill & @ [employer] Medications: Compliance- yes  Lightheadedness,Syncope- no    Edema- no  #2 FASTING HYPERGLYCEMIA: FBS123 on 8/20 Disease Monitoring: Blood Sugar ranges-FBS averages 117  Polyuria/phagia/dipsia- no       Visual problems- no Medications: Compliance- no meds  Hypoglycemic symptoms- no  #3 HYPERLIPIDEMIA: Disease Monitoring: See symptoms for Hypertension Medications: Compliance- yes  Abd pain, bowel changes- no   Muscle aches- no  Lab studies done 09/30/2010 by cardiology revealed a fasting blood sugar 123 as  noted. TSH was low-normal at 0.35. He is not on thyroid replacement; he is not on amiodarone.             Objective:   Physical Exam Gen.: Healthy and well-nourished in appearance. Alert, appropriate and cooperative throughout exam. Head: Normocephalic with osteoma & lipoma L forehead; pattern  alopecia  Eyes: No corneal or conjunctival inflammation noted. Pupils equal round reactive to light and accommodation. .Ptosis OS > OD. Extraocular motion intact.  Ears: External  ear exam reveals no significant lesions or deformities. Canals clear .TMs normal.  Nose: External nasal exam reveals no deformity or inflammation. Nasal mucosa are pink and moist. No lesions or exudates noted.  Mouth: Oral mucosa and oropharynx reveal no lesions or exudates. Teeth in good repair. Neck: No deformities, masses, or tenderness noted.  Thyroid normal. Lungs: Normal respiratory effort; chest expands symmetrically. Lungs are clear to auscultation without rales, wheezes, or increased work of breathing. Heart: Normal rate and rhythm. Normal S1 and S2. No gallop, click, or rub. S4 w/o  murmur. Abdomen: Bowel sounds normal; abdomen soft and nontender. No masses, organomegaly or hernias noted. Genitalia: Small granuloma is noted in the right scrotum; the small varicocele on the left. No DRE due to age, lack of symptoms & PSA 0.65 in 2007.   Marland Kitchen  Musculoskeletal/extremities: No deformity or scoliosis noted of  the thoracic or lumbar spine. No clubbing, cyanosis, edema, or deformity noted. Range of motion  normal .Tone & strength  normal.Joints normal. Nail health  good. Vascular: Carotid, radial artery, dorsalis pedis and  posterior tibial pulses are full and equal. No bruits present. Neurologic: Alert and oriented x3. Deep tendon reflexes symmetrical and normal.          Skin: Intact without suspicious lesions or rashes. Lymph: No cervical, axillary , inguinal  lymphadenopathy present. Psych: Mood and affect are normal. Normally interactive                                                                                         Assessment & Plan:  #1 Medicare Wellness Exam; criteria met ; data entered #2 Problem List reviewed ; Assessment/ Recommendations made  #3 low-normal TSH; 4 thyroid function tests indicated. Plan: see Orders

## 2010-10-24 ENCOUNTER — Encounter: Payer: Self-pay | Admitting: Internal Medicine

## 2010-10-24 ENCOUNTER — Ambulatory Visit (INDEPENDENT_AMBULATORY_CARE_PROVIDER_SITE_OTHER): Payer: Medicare Other | Admitting: *Deleted

## 2010-10-24 ENCOUNTER — Other Ambulatory Visit: Payer: Self-pay | Admitting: Internal Medicine

## 2010-10-24 DIAGNOSIS — I428 Other cardiomyopathies: Secondary | ICD-10-CM

## 2010-11-08 NOTE — Progress Notes (Signed)
ICD checked by remote. 

## 2010-11-11 ENCOUNTER — Encounter (HOSPITAL_COMMUNITY): Payer: Medicare Other

## 2010-11-13 ENCOUNTER — Encounter: Payer: Self-pay | Admitting: *Deleted

## 2010-12-09 ENCOUNTER — Encounter (HOSPITAL_COMMUNITY): Payer: Self-pay

## 2010-12-09 ENCOUNTER — Ambulatory Visit (HOSPITAL_COMMUNITY)
Admission: RE | Admit: 2010-12-09 | Discharge: 2010-12-09 | Disposition: A | Discharge status: Home / Self Care | Payer: Medicare Other | Source: Ambulatory Visit | Attending: Internal Medicine | Admitting: Internal Medicine

## 2010-12-09 DIAGNOSIS — I5022 Chronic systolic (congestive) heart failure: Secondary | ICD-10-CM | POA: Insufficient documentation

## 2010-12-09 DIAGNOSIS — I2589 Other forms of chronic ischemic heart disease: Secondary | ICD-10-CM | POA: Insufficient documentation

## 2010-12-09 DIAGNOSIS — I2510 Atherosclerotic heart disease of native coronary artery without angina pectoris: Secondary | ICD-10-CM | POA: Insufficient documentation

## 2010-12-09 NOTE — Progress Notes (Signed)
HPI: Joseph Malone  is a pleasant 77 year old male with a history of coronary artery disease status post large anterior MI with coronary  bypass grafting in 2001 with a LIMA to the LAD.  He has history of  congestive heart failure secondary to resultant ischemic cardiomyopathy,  ejection fraction however is in the 35-40% range.  He is status post  single-chamber ICD.  He did have a nuclear study in March 01, 2006, which showed an EF of 38% a large anterior infarction, but no ischemia. ETT 10/11 was normal (8:58min on Bruce)   The rest of his medical history is notable for obesity, hypertension,  hyperlipidemia, and glucose intolerance.  He denies any intercurrent ICD therapies.   Will have complete physical by Dr Alwyn Ren October 25, 2010. SOB on exertion.    He is here for follow up. He weighs daily and his weight has been 240-244. He continues to have intermittent CP but this is felt to be torn cartlige.  SOB on exertion. Denies orthopnea/PND. No lower extremity edema. Walking 1-2 times at [facility] for 30 minutes. Follows low salt and low carbohydrate diet.     ROS: All systems negative except as listed in HPI, PMH and Problem List.  Past Medical History  Diagnosis Date  . CAD (coronary artery disease)     s/p CABG  . Heart attack 2001  . Heart failure     CHF due to ischemic CM  . Ischemic cardiomyopathy   . Excess weight   . Hypertension   . Hyperlipidemia   . Glucose intolerance (malabsorption)   . BPH (benign prostatic hypertrophy)   . Microscopic hematuria     Dr. Wanda Plump  . Diverticulosis   . Hx of colonoscopy     Current Outpatient Prescriptions  Medication Sig Dispense Refill  . aspirin 325 MG tablet Take 325 mg by mouth daily.        . carvedilol (COREG) 25 MG tablet Take 25 mg by mouth 2 (two) times daily.        . Cholecalciferol (VITAMIN D3) 2000 UNITS TABS Take by mouth daily.        . Flaxseed, Linseed, (FLAX SEED OIL) 1000 MG CAPS Take by mouth.        .  folic acid (FOLVITE) 400 MCG tablet Take 400 mcg by mouth daily.        Marland Kitchen glucosamine-chondroitin 500-400 MG tablet Take 1 tablet by mouth 2 (two) times daily.        Marland Kitchen glucose blood test strip 1 each by Other route as needed. Freestyle Lite, test once daily       . Lancet Device MISC by Does not apply route. Freestlyle [employer], once daily       . Multiple Vitamin (MULTIVITAMIN) tablet Take 1 tablet by mouth daily.        . sildenafil (VIAGRA) 100 MG tablet Take 100 mg by mouth as needed.        . simvastatin (ZOCOR) 40 MG tablet Take 40 mg by mouth at bedtime.        . valsartan (DIOVAN) 80 MG tablet Take 80 mg by mouth 2 (two) times daily.           PHYSICAL EXAM: Filed Vitals:   12/09/10 1517  BP: 118/80  Pulse: 73    Weight: 253 (248 pounds) General:  Well appearing. No resp difficulty HEENT: normal Neck: supple. JVP flat. Carotids 2+ bilaterally; no bruits. No lymphadenopathy or thryomegaly appreciated.  Cor: PMI normal. Regular rate & rhythm. No rubs, gallops or murmurs. Lungs: clear Abdomen: soft, nontender, nondistended. No hepatosplenomegaly. No bruits or masses. Good bowel sounds. Extremities: no cyanosis, clubbing, rash, edema Neuro: alert & orientedx3, cranial nerves grossly intact. Moves all 4 extremities w/o difficulty. Affect pleasant.       ASSESSMENT & PLAN:

## 2010-12-09 NOTE — Patient Instructions (Addendum)
Follow up in 6 months 

## 2010-12-09 NOTE — Assessment & Plan Note (Signed)
NYHA II. Volume status stable.  No evidence of ischemia. Continue current medications. Encouraged to exercise 3-4 weeks.   Patient seen and examined with Tonye Becket, NP. We discussed all aspects of the encounter. I agree with the assessment and plan as stated above.

## 2010-12-09 NOTE — Assessment & Plan Note (Signed)
Lipids reviewed with him. Look great (HDL 47/ LDL 57). Continue current regimen.

## 2010-12-09 NOTE — Assessment & Plan Note (Deleted)
NYHA II. Volume status stable.  No evidence of ischemia. Continue current medications. Encouraged to exercise 3-4 weeks.

## 2010-12-09 NOTE — Assessment & Plan Note (Addendum)
No evidence of ischemia. Continue current medications.   Patient seen and examined with Tonye Becket, NP. We discussed all aspects of the encounter. I agree with the assessment and plan as stated above. He continues do to well without any signs of ischemia of HF.

## 2011-01-02 NOTE — Progress Notes (Signed)
Patient seen and examined with Amy Clegg, NP. We discussed all aspects of the encounter. I agree with the assessment and plan as stated above.   

## 2011-01-17 ENCOUNTER — Encounter: Payer: Self-pay | Admitting: Internal Medicine

## 2011-01-17 ENCOUNTER — Ambulatory Visit (INDEPENDENT_AMBULATORY_CARE_PROVIDER_SITE_OTHER): Payer: Medicare Other | Admitting: Internal Medicine

## 2011-01-17 DIAGNOSIS — Z95810 Presence of automatic (implantable) cardiac defibrillator: Secondary | ICD-10-CM

## 2011-01-17 DIAGNOSIS — I5022 Chronic systolic (congestive) heart failure: Secondary | ICD-10-CM

## 2011-01-17 DIAGNOSIS — I2589 Other forms of chronic ischemic heart disease: Secondary | ICD-10-CM

## 2011-01-17 LAB — ICD DEVICE OBSERVATION
BRDY-0002RV: 40 {beats}/min
CHARGE TIME: 8.8 s
RV LEAD AMPLITUDE: 9.3 mv
TZAT-0001SLOWVT: 1
TZAT-0001SLOWVT: 2
TZAT-0002FASTVT: NEGATIVE
TZAT-0002SLOWVT: NEGATIVE
TZAT-0002SLOWVT: NEGATIVE
TZAT-0012FASTVT: 200 ms
TZAT-0013SLOWVT: 2
TZAT-0018SLOWVT: NEGATIVE
TZAT-0018SLOWVT: NEGATIVE
TZAT-0019FASTVT: 8 V
TZAT-0019SLOWVT: 8 V
TZAT-0019SLOWVT: 8 V
TZAT-0020FASTVT: 1.6 ms
TZAT-0020SLOWVT: 1.6 ms
TZON-0005SLOWVT: 12
TZON-0008FASTVT: 0 ms
TZON-0008SLOWVT: 0 ms
TZON-0010FASTVT: 30 ms
TZON-0010SLOWVT: 30 ms
TZST-0001FASTVT: 4
TZST-0001FASTVT: 6
TZST-0001SLOWVT: 3
TZST-0002FASTVT: NEGATIVE
TZST-0002FASTVT: NEGATIVE
TZST-0002FASTVT: NEGATIVE
TZST-0002SLOWVT: NEGATIVE
TZST-0002SLOWVT: NEGATIVE
TZST-0002SLOWVT: NEGATIVE
TZST-0003FASTVT: 20 J
TZST-0003FASTVT: 35 J
TZST-0003SLOWVT: 35 J
TZST-0003SLOWVT: 35 J
VENTRICULAR PACING ICD: 0 pct

## 2011-01-17 MED ORDER — LOSARTAN POTASSIUM 50 MG PO TABS: 50.0000 mg | ORAL_TABLET | Freq: Every day | ORAL | Status: DC

## 2011-01-17 NOTE — Assessment & Plan Note (Signed)
He denies anginal symptoms. He'll continue his current medications. I encouraged him to increase his physical activity.

## 2011-01-17 NOTE — Patient Instructions (Signed)
Your physician wants you to follow-up in: 12 months You will receive a reminder letter in the mail two months in advance. If you don't receive a letter, please call our office to schedule the follow-up appointment.  STOP DIOVAN  START COZAAR 50 mg daily

## 2011-01-17 NOTE — Assessment & Plan Note (Signed)
His symptoms remain class II. He'll continue his current medications and maintain a low-sodium diet.

## 2011-01-17 NOTE — Progress Notes (Signed)
HPI Joseph Malone returns today for followup. He is a very pleasant 77 year old man with a long-standing history of chronic systolic heart failure, coronary artery disease status post MI, hypertension, who is status post ICD implantation. The patient continues to do well. He denies chest pain or shortness of breath. No syncope or ICD shocks. Allergies  Allergen Reactions  . Penicillins     rash     Current Outpatient Prescriptions  Medication Sig Dispense Refill  . aspirin 325 MG tablet Take 325 mg by mouth daily.        . carvedilol (COREG) 25 MG tablet Take 25 mg by mouth 2 (two) times daily.        . Cholecalciferol (VITAMIN D3) 2000 UNITS TABS Take by mouth daily.        . Flaxseed, Linseed, (FLAX SEED OIL) 1000 MG CAPS Take by mouth.        . folic acid (FOLVITE) 400 MCG tablet Take 400 mcg by mouth daily.        Marland Kitchen glucosamine-chondroitin 500-400 MG tablet Take 1 tablet by mouth 2 (two) times daily.        Marland Kitchen glucose blood test strip 1 each by Other route as needed. Freestyle Lite, test once daily       . Lancet Device MISC by Does not apply route. Freestlyle [employer], once daily       . Multiple Vitamin (MULTIVITAMIN) tablet Take 1 tablet by mouth daily.        . sildenafil (VIAGRA) 100 MG tablet Take 100 mg by mouth as needed.        . simvastatin (ZOCOR) 40 MG tablet Take 40 mg by mouth at bedtime.        . valsartan (DIOVAN) 80 MG tablet Take 80 mg by mouth 2 (two) times daily.           Past Medical History  Diagnosis Date  . CAD (coronary artery disease)     s/p CABG  . Heart attack 2001  . Heart failure     CHF due to ischemic CM  . Ischemic cardiomyopathy   . Excess weight   . Hypertension   . Hyperlipidemia   . Glucose intolerance (malabsorption)   . BPH (benign prostatic hypertrophy)   . Microscopic hematuria     Dr. Wanda Plump  . Diverticulosis   . Hx of colonoscopy     ROS:   All systems reviewed and negative except as noted in the HPI.   Past Surgical  History  Procedure Date  . Coronary artery bypass graft 2000    1 vessel  . Cardiac defibrillator placement 2005    Medtronic  . Cardiac catheterization 2000  . Appendectomy   . Pilonidal cyst excision   . Cataract extraction 2008    bilateral with lens implant  . Colonoscopy 2004    Tics  . Transthoracic echocardiogram 08/29/05     Family History  Problem Relation Age of Onset  . Hypertension Father   . Heart attack Father 52  . Diabetes Brother     X 2  . Coronary artery disease Brother   . Heart attack Paternal Uncle 41  . Sudden death Paternal Grandfather 45     History   Social History  . Marital Status: Married    Spouse Name: N/A    Number of Children: N/A  . Years of Education: N/A   Occupational History  . Not on file.   Social History Main  Topics  . Smoking status: Never Smoker   . Smokeless tobacco: Not on file  . Alcohol Use: No  . Drug Use: No  . Sexually Active: Not on file   Other Topics Concern  . Not on file   Social History Narrative  . No narrative on file     BP 112/78  Pulse 76  Ht 6' (1.829 m)  Wt 115.722 kg (255 lb 1.9 oz)  BMI 34.60 kg/m2  Physical Exam:  Well appearing elderly man, NAD HEENT: Unremarkable Neck:  No JVD, no thyromegally Lymphatics:  No adenopathy Back:  No CVA tenderness Lungs:  Clear with no wheezes, rales, or rhonchi. Well-healed ICD incision. HEART:  Regular rate rhythm, no murmurs, no rubs, no clicks Abd:  soft, positive bowel sounds, no organomegally, no rebound, no guarding Ext:  2 plus pulses, no edema, no cyanosis, no clubbing Skin:  No rashes no nodules Neuro:  CN II through XII intact, motor grossly intact  DEVICE  Normal device function.  See PaceArt for details.   Assess/Plan:

## 2011-01-17 NOTE — Assessment & Plan Note (Signed)
His device is working normally. Continue his current therapy and recheck the device in several months.

## 2011-04-07 ENCOUNTER — Telehealth (HOSPITAL_COMMUNITY): Payer: Self-pay | Admitting: *Deleted

## 2011-04-07 NOTE — Telephone Encounter (Signed)
Joseph Malone called today.  He needs refills on his simvastatin, and carvedilol to be called into his pharmacy.  He now is using [facility], the number is [phone], and the fax number is [phone].  Thanks.

## 2011-04-08 NOTE — Telephone Encounter (Signed)
Prescriptions written and faxed in, pt aware

## 2011-04-24 ENCOUNTER — Ambulatory Visit (INDEPENDENT_AMBULATORY_CARE_PROVIDER_SITE_OTHER): Payer: Medicare Other | Admitting: *Deleted

## 2011-04-24 ENCOUNTER — Encounter: Payer: Self-pay | Admitting: Internal Medicine

## 2011-04-24 DIAGNOSIS — I428 Other cardiomyopathies: Secondary | ICD-10-CM

## 2011-04-25 LAB — REMOTE ICD DEVICE
BRDY-0002RV: 40 {beats}/min
CHARGE TIME: 9.12 s
DEV-0020ICD: NEGATIVE
HV IMPEDENCE: 48 Ohm
RV LEAD IMPEDENCE ICD: 616 Ohm
TOT-0006: 20121207000000
TZAT-0002SLOWVT: NEGATIVE
TZAT-0002SLOWVT: NEGATIVE
TZAT-0004FASTVT: 8
TZAT-0004SLOWVT: 8
TZAT-0004SLOWVT: 8
TZAT-0005FASTVT: 88 pct
TZAT-0005SLOWVT: 84 pct
TZAT-0005SLOWVT: 91 pct
TZAT-0011SLOWVT: 10 ms
TZAT-0011SLOWVT: 10 ms
TZAT-0012FASTVT: 200 ms
TZAT-0012SLOWVT: 200 ms
TZAT-0013FASTVT: 1
TZAT-0019SLOWVT: 8 V
TZAT-0020FASTVT: 1.6 ms
TZON-0003SLOWVT: 360 ms
TZON-0010AFLUTTER: 30 ms
TZON-0010FASTVT: 30 ms
TZON-0011AFLUTTER: 70
TZST-0001FASTVT: 4
TZST-0001FASTVT: 5
TZST-0001SLOWVT: 4
TZST-0002FASTVT: NEGATIVE
TZST-0002FASTVT: NEGATIVE
TZST-0002FASTVT: NEGATIVE
TZST-0002SLOWVT: NEGATIVE
TZST-0002SLOWVT: NEGATIVE
TZST-0003FASTVT: 20 J
TZST-0003FASTVT: 35 J
TZST-0003FASTVT: 35 J
TZST-0003FASTVT: 35 J
TZST-0003SLOWVT: 35 J
TZST-0003SLOWVT: 35 J

## 2011-05-01 ENCOUNTER — Other Ambulatory Visit (HOSPITAL_COMMUNITY): Payer: Self-pay | Admitting: Internal Medicine

## 2011-05-02 NOTE — Progress Notes (Signed)
Remote defib check  

## 2011-05-07 ENCOUNTER — Encounter: Payer: Self-pay | Admitting: *Deleted

## 2011-06-10 ENCOUNTER — Ambulatory Visit (HOSPITAL_COMMUNITY)
Admission: RE | Admit: 2011-06-10 | Discharge: 2011-06-10 | Disposition: A | Discharge status: Home / Self Care | Payer: Medicare Other | Source: Ambulatory Visit | Attending: Internal Medicine | Admitting: Internal Medicine

## 2011-06-10 VITALS — BP 126/68 | HR 75 | Wt 256.0 lb

## 2011-06-10 DIAGNOSIS — E785 Hyperlipidemia, unspecified: Secondary | ICD-10-CM

## 2011-06-10 DIAGNOSIS — I5022 Chronic systolic (congestive) heart failure: Secondary | ICD-10-CM

## 2011-06-10 DIAGNOSIS — I2510 Atherosclerotic heart disease of native coronary artery without angina pectoris: Secondary | ICD-10-CM

## 2011-06-10 DIAGNOSIS — I2589 Other forms of chronic ischemic heart disease: Secondary | ICD-10-CM | POA: Insufficient documentation

## 2011-06-10 NOTE — Patient Instructions (Signed)
We will contact you in 6 months to schedule your next appointment.  

## 2011-06-10 NOTE — Progress Notes (Signed)
Patient ID: Joseph Malone, male   DOB: August 18, 1930, 78 y.o.   MRN: [id number]  HPI: Joseph Malone  is a pleasant 78 year old male with a history of coronary artery disease status post large anterior MI with coronary  bypass grafting in 2001 with a LIMA to the LAD.  He has history of  congestive heart failure secondary to resultant ischemic cardiomyopathy,  ejection fraction however is in the 35-40% range.  He is status post  single-chamber ICD.  He did have a nuclear study in March 01, 2006, which showed an EF of 38% a large anterior infarction, but no ischemia. ETT 10/11 was normal (8:34min on Bruce)   The rest of his medical history is notable for obesity, hypertension,  hyperlipidemia, and glucose intolerance.  He denies any intercurrent ICD therapies.  Returns for routine f/u. Over past year has noticed he has been a bit weaker. Feels he just can't do what he used to do. That said he remains fairly active. Goes to gym several times a week and does weights and walks on TM for 30 mins. Also cutting grass and cleaning gutters. Say once he gets going feels fine. Also reports that he feels fine in the am and more tired after taking meds. We cut carvedilol back some time ago without any improvement.  + DOE with stairs.   Denies PND or edema. No ICD firings. Checking BP at home with systolics 120s  . He does weigh most days and his weight has been 245. No snoring.  Has gained 15 pounds over past 3 years.  Bloodwork 8-12 ok  Lab Results  Component Value Date   CHOL 130 10/22/2010   HDL 47.80 10/22/2010   LDLCALC 57 10/22/2010   TRIG 124.0 10/22/2010   CHOLHDL 3 10/22/2010      ROS: All systems negative except as listed in HPI, PMH and Problem List.  Past Medical History  Diagnosis Date  . CAD (coronary artery disease)     s/p CABG  . Heart attack 2001  . Heart failure     CHF due to ischemic CM  . Ischemic cardiomyopathy   . Excess weight   . Hypertension   . Hyperlipidemia   . Glucose intolerance  (malabsorption)   . BPH (benign prostatic hypertrophy)   . Microscopic hematuria     Dr. Wanda Plump  . Diverticulosis   . Hx of colonoscopy       Current outpatient prescriptions:aspirin 325 MG tablet, Take 325 mg by mouth daily.  , Disp: , Rfl: ;  carvedilol (COREG) 25 MG tablet, Take 25 mg by mouth 2 (two) times daily.  , Disp: , Rfl: ;  Cholecalciferol (VITAMIN D3) 2000 UNITS TABS, Take by mouth daily.  , Disp: , Rfl: ;  Flaxseed, Linseed, (FLAX SEED OIL) 1000 MG CAPS, Take by mouth.  , Disp: , Rfl: ;  folic acid (FOLVITE) 400 MCG tablet, Take 400 mcg by mouth daily.  , Disp: , Rfl:  glucosamine-chondroitin 500-400 MG tablet, Take 1 tablet by mouth 2 (two) times daily.  , Disp: , Rfl: ;  glucose blood test strip, 1 each by Other route as needed. Freestyle Lite, test once daily , Disp: , Rfl: ;  Lancet Device MISC, by Does not apply route. Freestlyle [employer], once daily , Disp: , Rfl: ;  Multiple Vitamin (MULTIVITAMIN) tablet, Take 1 tablet by mouth daily.  , Disp: , Rfl:  Omega-3 Fatty Acids (FISH OIL) 1200 MG CAPS, Take 2 capsules by mouth  daily., Disp: , Rfl: ;  simvastatin (ZOCOR) 40 MG tablet, Take 40 mg by mouth at bedtime.  , Disp: , Rfl: ;  valsartan (DIOVAN) 80 MG tablet, Take 80 mg by mouth 2 (two) times daily., Disp: , Rfl: ;  VIAGRA 100 MG tablet, TAKE AS DIRECTED AS NEEDED, Disp: 10 tablet, Rfl: 6  PHYSICAL EXAM: Filed Vitals:   06/10/11 1338  BP: 126/68  Pulse: 75  Weight: 256 lb (116.121 kg)  SpO2: 95%    Weight: 256 pounds General:  Well appearing. No resp difficulty HEENT: normal Neck: supple. JVP flat. Carotids 2+ bilaterally; no bruits. No lymphadenopathy or thryomegaly appreciated. Cor: PMI normal. Regular rate & rhythm. No rubs, gallops or murmurs. Lungs: clear Abdomen: soft, nontender, nondistended. No hepatosplenomegaly. No bruits or masses. Good bowel sounds. Extremities: no cyanosis, clubbing, rash, edema Neuro: alert & orientedx3, cranial nerves grossly  intact. Moves all 4 extremities w/o difficulty. Affect pleasant.     ASSESSMENT & PLAN:

## 2011-06-20 NOTE — Assessment & Plan Note (Signed)
No evidence of ischemia. Continue current regimen.   

## 2011-06-20 NOTE — Assessment & Plan Note (Signed)
Goal LDL < 70.  Continue statin.  

## 2011-06-20 NOTE — Assessment & Plan Note (Signed)
Stable NYHA II-III exertional dyspnea. Volume status looks good. Continue current regimen.

## 2011-07-01 ENCOUNTER — Telehealth (HOSPITAL_COMMUNITY): Payer: Self-pay | Admitting: *Deleted

## 2011-07-01 MED ORDER — LOSARTAN POTASSIUM 50 MG PO TABS: 50.0000 mg | ORAL_TABLET | Freq: Every day | ORAL | Status: DC

## 2011-07-01 NOTE — Telephone Encounter (Signed)
Joseph Malone called today. He has some questions regarding his medication. Please call him.  Thanks.l

## 2011-07-01 NOTE — Telephone Encounter (Signed)
Spoke w/pt he states new drug company gave him losartan 50 mg daily, advised that was prescribed by Dr Ladona Ridgel and that is ok b/c there is no generic Diovan available right now

## 2011-07-31 ENCOUNTER — Encounter: Payer: Self-pay | Admitting: Internal Medicine

## 2011-07-31 ENCOUNTER — Ambulatory Visit (INDEPENDENT_AMBULATORY_CARE_PROVIDER_SITE_OTHER): Payer: Medicare Other | Admitting: *Deleted

## 2011-07-31 DIAGNOSIS — I5022 Chronic systolic (congestive) heart failure: Secondary | ICD-10-CM

## 2011-07-31 DIAGNOSIS — Z95810 Presence of automatic (implantable) cardiac defibrillator: Secondary | ICD-10-CM

## 2011-08-11 LAB — REMOTE ICD DEVICE
BATTERY VOLTAGE: 2.86 V
CHARGE TIME: 9.12 s
RV LEAD AMPLITUDE: 7 mv
TZAT-0004SLOWVT: 8
TZAT-0004SLOWVT: 8
TZAT-0005FASTVT: 88 pct
TZAT-0005SLOWVT: 91 pct
TZAT-0011FASTVT: 10 ms
TZAT-0011SLOWVT: 10 ms
TZAT-0011SLOWVT: 10 ms
TZAT-0012FASTVT: 200 ms
TZAT-0012SLOWVT: 200 ms
TZAT-0012SLOWVT: 200 ms
TZAT-0013FASTVT: 1
TZAT-0013SLOWVT: 2
TZAT-0013SLOWVT: 2
TZAT-0018FASTVT: NEGATIVE
TZAT-0020SLOWVT: 1.6 ms
TZAT-0020SLOWVT: 1.6 ms
TZON-0003FASTVT: 240 ms
TZON-0003SLOWVT: 360 ms
TZON-0004SLOWVT: 16
TZON-0010SLOWVT: 30 ms
TZON-0011AFLUTTER: 70
TZST-0001FASTVT: 3
TZST-0001FASTVT: 5
TZST-0001SLOWVT: 4
TZST-0001SLOWVT: 6
TZST-0002FASTVT: NEGATIVE
TZST-0002SLOWVT: NEGATIVE
TZST-0002SLOWVT: NEGATIVE
TZST-0003FASTVT: 35 J
TZST-0003FASTVT: 35 J
TZST-0003SLOWVT: 35 J
TZST-0003SLOWVT: 35 J

## 2011-09-05 ENCOUNTER — Encounter: Payer: Self-pay | Admitting: *Deleted

## 2011-10-06 ENCOUNTER — Telehealth (HOSPITAL_COMMUNITY): Payer: Self-pay | Admitting: Cardiology

## 2011-10-06 NOTE — Telephone Encounter (Signed)
Spoke w/pt he states in AM before taking meds his BP is running 150s/80s, he has split his losartan to 25 mg Twice daily instead of 50 mg daily and feels like this has helped some but BP still high in AM, he states it does come down to 120s/50-60s after taking carvedilol, will discuss w/Dr Bensimhon and call him back

## 2011-10-06 NOTE — Telephone Encounter (Signed)
Pt wanted to let Dr.Bensimhon know that his B/P is elevated. Blood pressure taken @ 1200--> 155/79 HR 59 meds taken @10  On 10/05/11 BP 168/89 few hours later 121/52.  Could his b/p continue to elevate b/c of his generic meds?  Please call

## 2011-10-07 MED ORDER — LOSARTAN POTASSIUM 50 MG PO TABS: 50.0000 mg | ORAL_TABLET | Freq: Every day | ORAL | Status: DC

## 2011-10-07 NOTE — Telephone Encounter (Signed)
Pt is aware and agreeable, he will let me know if BP continues to run high

## 2011-10-07 NOTE — Telephone Encounter (Signed)
Try taking 50mg  of losartan at bedtime (don't take in am) and see if that helps. May need to go to 100.

## 2011-10-29 ENCOUNTER — Encounter: Payer: Self-pay | Admitting: Internal Medicine

## 2011-10-29 ENCOUNTER — Ambulatory Visit (INDEPENDENT_AMBULATORY_CARE_PROVIDER_SITE_OTHER): Payer: Medicare Other | Admitting: Internal Medicine

## 2011-10-29 VITALS — BP 118/76 | HR 84 | Temp 98.3°F | Resp 12 | Ht 71.5 in | Wt 253.0 lb

## 2011-10-29 DIAGNOSIS — K5730 Diverticulosis of large intestine without perforation or abscess without bleeding: Secondary | ICD-10-CM

## 2011-10-29 DIAGNOSIS — R7309 Other abnormal glucose: Secondary | ICD-10-CM

## 2011-10-29 DIAGNOSIS — Z0000 Encounter for general adult medical examination without abnormal findings: Secondary | ICD-10-CM

## 2011-10-29 DIAGNOSIS — E785 Hyperlipidemia, unspecified: Secondary | ICD-10-CM

## 2011-10-29 DIAGNOSIS — I10 Essential (primary) hypertension: Secondary | ICD-10-CM

## 2011-10-29 NOTE — Progress Notes (Signed)
Subjective:    Patient ID: Joseph Malone, male    DOB: 11-26-1930, 77 y.o.   MRN: [id number]  HPI Medicare Wellness Visit:  The following psychosocial & medical history were reviewed as required by Medicare.   Social history: caffeine:1  diet Coke/ day , alcohol:  none ,  tobacco use : never  & exercise : no since 2/13 due to DOE.   Home & personal  safety / fall risk: no issues, activities of daily living: no limitations , seatbelt use : yes , and smoke alarm employment : yes .  Power of Attorney/Living Will status : ? Needed  Vision ( as recorded per Nurse) & Hearing  evaluation : Ophth exam 8/13.No hearing exam Orientation :oriented X 3 , memory & recall :good, math testing: good,and mood & affect : normal . Depression / anxiety: denied Travel history : 2003 [place] , immunization status :Flu due , transfusion history: no, and preventive health surveillance ( colonoscopies, BMD , etc as per protocol/ [facility]): colonoscopy up to date, Dental care:  Every 6 mos . Chart reviewed &  Updated. Active issues reviewed & addressed.       Review of Systems HYPERTENSION: Disease Monitoring: Blood pressure range-up recently until time of administration changed Chest pain, palpitations- chronic , intermittent chest pain, discussed with Cardiologist       Dyspnea- see above , some initial wheezing upon reclining until he gets warm. No PND or  PMH of asthma Medications: Compliance- yes  Lightheadedness,Syncope-no   Edema- no  FASTING HYPERGLYCEMIA, PMH of:  FBS: 101-130   Polyuria/phagia/dipsia-no      Visual problems- no  HYPERLIPIDEMIA: Disease Monitoring: See symptoms for Hypertension Medications: Compliance- yes  Abd pain, bowel changes- no   Muscle aches- no        Objective:   Physical Exam Gen.: Appears younger than stated age ; well-nourished in appearance. Alert, appropriate and cooperative throughout exam. Head: Normocephalic with L frontal osteoma; [pattern alopecia  Eyes: No  corneal or conjunctival inflammation noted. Pupils equal round reactive to light and accommodation.  Extraocular motion intact. Vision grossly normal. Ears: External  ear exam reveals no significant lesions or deformities. Canals clear .TMs normal. Hearing is grossly normal bilaterally. Nose: External nasal exam reveals no deformity or inflammation. Nasal mucosa are pink and moist. No lesions or exudates noted.  Mouth: Oral mucosa and oropharynx reveal no lesions or exudates. Teeth in good repair. Neck: No deformities, masses, or tenderness noted. Range of motion & Thyroid  normal Lungs: Normal respiratory effort; chest expands symmetrically. Lungs are clear to auscultation without rales, wheezes, or increased work of breathing even supine. Heart: Normal rate and rhythm. Normal S1 and S2. No gallop, click, or rub. S4 w/o murmur. Abdomen: Bowel sounds normal; abdomen soft and nontender. No masses, organomegaly or hernias noted. Genitalia: deferred due to age   .                                                                                   Musculoskeletal/extremities: No deformity or scoliosis noted of  the thoracic or lumbar spine. No clubbing, cyanosis, edema, or deformity noted. Range of motion  normal .  Tone & strength  normal.Joints normal. Nail health  good. Vascular: Carotid, radial artery, dorsalis pedis and  posterior tibial pulses are full and equal. No bruits present. Neurologic: Alert and oriented x3. Deep tendon reflexes symmetrical and normal.          Skin: Intact without suspicious lesions or rashes. Lymph: No cervical, axillary lymphadenopathy present. Psych: Mood and affect are normal. Normally interactive                                                                                         Assessment & Plan:  #1 Medicare Wellness Exam; criteria met ; data entered #2 Problem List reviewed ; Assessment/ Recommendations made  EKG: Nonspecific ST-T changes leads 1, aVL,  V2-3. Essentially unchanged versus 09/30/10 Plan: see Orders

## 2011-10-29 NOTE — Patient Instructions (Addendum)
Preventive Health Care: Eat a low-fat diet with lots of fruits and vegetables, up to 7-9 servings per day.  Consume less than 40 grams of sugar per day from foods & drinks with High Fructose Corn Sugar as # 1,2,3 or # 4 on label. Health Care Power of Attorney & Living Will. Complete if not in place ; these place you in charge of your health care decisions. Please  schedule fasting Labs : BMET,Lipids, hepatic panel, CBC & dif, TSH.PLEASE BRING THESE INSTRUCTIONS TO FOLLOW UP  LAB APPOINTMENT.This will guarantee correct labs are drawn, eliminating need for repeat blood sampling ( needle sticks ! ). Diagnoses /Codes: 562.10, 790.29,272.4, 401.9 Take the EKG to any emergency room or preop visits. There are nonspecific changes; as long as there is no new change these are not clinically significa  If you activate My Chart; the results can be released to you as soon as they populate from the lab. If you choose not to use this program; the labs have to be reviewed, copied & mailed   causing a delay in getting the results to you.

## 2011-10-31 ENCOUNTER — Other Ambulatory Visit (INDEPENDENT_AMBULATORY_CARE_PROVIDER_SITE_OTHER): Payer: Medicare Other

## 2011-10-31 DIAGNOSIS — E785 Hyperlipidemia, unspecified: Secondary | ICD-10-CM

## 2011-10-31 DIAGNOSIS — I10 Essential (primary) hypertension: Secondary | ICD-10-CM

## 2011-10-31 DIAGNOSIS — K5730 Diverticulosis of large intestine without perforation or abscess without bleeding: Secondary | ICD-10-CM

## 2011-10-31 DIAGNOSIS — R7309 Other abnormal glucose: Secondary | ICD-10-CM

## 2011-10-31 LAB — CBC WITH DIFFERENTIAL/PLATELET
Basophils Absolute: 0.1 10*3/uL (ref 0.0–0.1)
Eosinophils Absolute: 0.3 10*3/uL (ref 0.0–0.7)
Lymphocytes Relative: 41 % (ref 12.0–46.0)
MCHC: 34.2 g/dL (ref 30.0–36.0)
Neutro Abs: 3.1 10*3/uL (ref 1.4–7.7)
Neutrophils Relative %: 45.8 % (ref 43.0–77.0)
Platelets: 147 10*3/uL — ABNORMAL LOW (ref 150.0–400.0)
RDW: 12.8 % (ref 11.5–14.6)

## 2011-10-31 LAB — HEPATIC FUNCTION PANEL
ALT: 26 U/L (ref 0–53)
AST: 24 U/L (ref 0–37)
Alkaline Phosphatase: 48 U/L (ref 39–117)
Bilirubin, Direct: 0.1 mg/dL (ref 0.0–0.3)
Total Protein: 6.8 g/dL (ref 6.0–8.3)

## 2011-10-31 LAB — BASIC METABOLIC PANEL
BUN: 17 mg/dL (ref 6–23)
Creatinine, Ser: 1.1 mg/dL (ref 0.4–1.5)
GFR: 69.97 mL/min (ref >60.00)

## 2011-10-31 LAB — LIPID PANEL: Cholesterol: 118 mg/dL (ref 0–200)

## 2011-11-03 LAB — HEMOGLOBIN A1C: Hgb A1c MFr Bld: 6.4 % (ref 4.6–6.5)

## 2011-11-05 ENCOUNTER — Ambulatory Visit: Payer: Medicare Other | Admitting: Internal Medicine

## 2011-11-17 ENCOUNTER — Ambulatory Visit (INDEPENDENT_AMBULATORY_CARE_PROVIDER_SITE_OTHER): Payer: Medicare Other | Admitting: *Deleted

## 2011-11-17 DIAGNOSIS — Z95810 Presence of automatic (implantable) cardiac defibrillator: Secondary | ICD-10-CM

## 2011-11-17 DIAGNOSIS — I2589 Other forms of chronic ischemic heart disease: Secondary | ICD-10-CM

## 2011-11-20 LAB — REMOTE ICD DEVICE
RV LEAD IMPEDENCE ICD: 568 Ohm
TOT-0006: 20130620000000
TZAT-0004FASTVT: 8
TZAT-0005FASTVT: 88 pct
TZAT-0005SLOWVT: 84 pct
TZAT-0005SLOWVT: 91 pct
TZAT-0011FASTVT: 10 ms
TZAT-0011SLOWVT: 10 ms
TZAT-0011SLOWVT: 10 ms
TZAT-0012FASTVT: 200 ms
TZAT-0012SLOWVT: 200 ms
TZAT-0012SLOWVT: 200 ms
TZAT-0013FASTVT: 1
TZAT-0018SLOWVT: NEGATIVE
TZAT-0019SLOWVT: 8 V
TZAT-0019SLOWVT: 8 V
TZON-0003SLOWVT: 360 ms
TZON-0005SLOWVT: 12
TZON-0008SLOWVT: 0 ms
TZON-0010AFLUTTER: 30 ms
TZON-0010FASTVT: 30 ms
TZON-0011AFLUTTER: 70
TZST-0001FASTVT: 3
TZST-0001FASTVT: 5
TZST-0001SLOWVT: 4
TZST-0001SLOWVT: 6
TZST-0002FASTVT: NEGATIVE
TZST-0002SLOWVT: NEGATIVE
TZST-0002SLOWVT: NEGATIVE
TZST-0002SLOWVT: NEGATIVE
TZST-0003FASTVT: 35 J
TZST-0003FASTVT: 35 J
TZST-0003SLOWVT: 35 J
TZST-0003SLOWVT: 35 J
TZST-0003SLOWVT: 35 J

## 2011-12-08 ENCOUNTER — Encounter: Payer: Self-pay | Admitting: Internal Medicine

## 2012-01-15 ENCOUNTER — Encounter: Payer: Self-pay | Admitting: *Deleted

## 2012-01-20 ENCOUNTER — Ambulatory Visit (INDEPENDENT_AMBULATORY_CARE_PROVIDER_SITE_OTHER): Payer: Medicare Other | Admitting: Internal Medicine

## 2012-01-20 ENCOUNTER — Encounter: Payer: Self-pay | Admitting: Internal Medicine

## 2012-01-20 VITALS — BP 148/89 | HR 69 | Resp 18 | Ht 72.0 in | Wt 253.0 lb

## 2012-01-20 DIAGNOSIS — I10 Essential (primary) hypertension: Secondary | ICD-10-CM

## 2012-01-20 DIAGNOSIS — Z95810 Presence of automatic (implantable) cardiac defibrillator: Secondary | ICD-10-CM

## 2012-01-20 DIAGNOSIS — I5022 Chronic systolic (congestive) heart failure: Secondary | ICD-10-CM

## 2012-01-20 LAB — ICD DEVICE OBSERVATION
BRDY-0002RV: 40 {beats}/min
FVT: 0
PACEART VT: 0
TZAT-0001SLOWVT: 1
TZAT-0001SLOWVT: 2
TZAT-0002FASTVT: NEGATIVE
TZAT-0002SLOWVT: NEGATIVE
TZAT-0002SLOWVT: NEGATIVE
TZAT-0004SLOWVT: 8
TZAT-0004SLOWVT: 8
TZAT-0005SLOWVT: 84 pct
TZAT-0005SLOWVT: 91 pct
TZAT-0011FASTVT: 10 ms
TZAT-0018SLOWVT: NEGATIVE
TZAT-0019FASTVT: 8 V
TZAT-0020SLOWVT: 1.6 ms
TZAT-0020SLOWVT: 1.6 ms
TZON-0008FASTVT: 0 ms
TZON-0010AFLUTTER: 30 ms
TZST-0001FASTVT: 3
TZST-0001FASTVT: 4
TZST-0001FASTVT: 6
TZST-0001SLOWVT: 3
TZST-0001SLOWVT: 5
TZST-0001SLOWVT: 6
TZST-0002FASTVT: NEGATIVE
TZST-0002FASTVT: NEGATIVE
TZST-0002FASTVT: NEGATIVE
TZST-0002FASTVT: NEGATIVE
TZST-0002SLOWVT: NEGATIVE
TZST-0002SLOWVT: NEGATIVE
TZST-0003FASTVT: 20 J
TZST-0003FASTVT: 35 J
TZST-0003FASTVT: 35 J
TZST-0003SLOWVT: 35 J
VENTRICULAR PACING ICD: 0 pct
VF: 0

## 2012-01-20 NOTE — Assessment & Plan Note (Signed)
His Medtronic single-chamber ICD is working normally. No recent ICD therapies. He is approximately one year from elective replacement.

## 2012-01-20 NOTE — Patient Instructions (Signed)
Your physician wants you to follow-up in: 12 months with Dr Taylor You will receive a reminder letter in the mail two months in advance. If you don't receive a letter, please call our office to schedule the follow-up appointment.    Remote monitoring is used to monitor your Pacemaker of ICD from home. This monitoring reduces the number of office visits required to check your device to one time per year. It allows us to keep an eye on the functioning of your device to ensure it is working properly. You are scheduled for a device check from home on 04/26/12. You may send your transmission at any time that day. If you have a wireless device, the transmission will be sent automatically. After your physician reviews your transmission, you will receive a postcard with your next transmission date.   

## 2012-01-20 NOTE — Progress Notes (Signed)
HPI Joseph Malone returns today for followup. He is a very pleasant 78 year old man with an ischemic cardiomyopathy, chronic systolic heart failure, status post ICD implantation. In the interim, he has been stable. He denies chest pain. He has chronic class II heart failure symptoms. He denies peripheral edema. No syncope. No ICD shock. Allergies  Allergen Reactions  . Penicillins     rash     Current Outpatient Prescriptions  Medication Sig Dispense Refill  . aspirin 325 MG tablet Take 325 mg by mouth daily.        . carvedilol (COREG) 25 MG tablet Take 25 mg by mouth 2 (two) times daily.        . Cholecalciferol (VITAMIN D3) 2000 UNITS TABS Take by mouth daily.        . Flaxseed, Linseed, (FLAX SEED OIL) 1000 MG CAPS Take by mouth.        . folic acid (FOLVITE) 400 MCG tablet Take 400 mcg by mouth daily.        Marland Kitchen glucosamine-chondroitin 500-400 MG tablet Take 1 tablet by mouth 2 (two) times daily.        Marland Kitchen glucose blood test strip 1 each by Other route as needed. Freestyle Lite, test once daily       . Lancet Device MISC by Does not apply route. Freestlyle [employer], once daily       . losartan (COZAAR) 50 MG tablet Take 1 tablet (50 mg total) by mouth at bedtime.  90 tablet  3  . Multiple Vitamin (MULTIVITAMIN) tablet Take 1 tablet by mouth daily.        . simvastatin (ZOCOR) 40 MG tablet Take 40 mg by mouth at bedtime.        Marland Kitchen VIAGRA 100 MG tablet TAKE AS DIRECTED AS NEEDED  10 tablet  6     Past Medical History  Diagnosis Date  . CAD (coronary artery disease)     s/p CABG  . Heart attack 2001  . Heart failure     CHF due to ischemic CM  . Ischemic cardiomyopathy   . Excess weight   . Hypertension   . Hyperlipidemia   . Fasting hyperglycemia   . BPH (benign prostatic hypertrophy)   . Microscopic hematuria     Dr. Wanda Plump  . Diverticulosis     ROS:   All systems reviewed and negative except as noted in the HPI.   Past Surgical History  Procedure Date  .  Coronary artery bypass graft 2000    1 vessel  . Cardiac defibrillator placement 2005    Medtronic  . Cardiac catheterization 2000  . Appendectomy   . Pilonidal cyst excision   . Cataract extraction 2008    bilateral with lens implant  . Colonoscopy 2004    Tics;  [facility]  . Transthoracic echocardiogram 08/29/2005     Family History  Problem Relation Age of Onset  . Hypertension Father   . Heart attack Father 60  . Diabetes Brother     X 2  . Coronary artery disease Brother   . Heart attack Paternal Uncle 71  . Sudden death Paternal Grandfather 7  . Lung cancer Sister     "Asian lung cancer"  . Stroke Neg Hx      History   Social History  . Marital Status: Married    Spouse Name: N/A    Number of Children: N/A  . Years of Education: N/A   Occupational History  .  Not on file.   Social History Main Topics  . Smoking status: Never Smoker   . Smokeless tobacco: Not on file  . Alcohol Use: No  . Drug Use: No  . Sexually Active: Not on file   Other Topics Concern  . Not on file   Social History Narrative  . No narrative on file     BP 148/89  Pulse 69  Resp 18  Ht 6' (1.829 m)  Wt 253 lb (114.76 kg)  BMI 34.31 kg/m2  SpO2 93%  Physical Exam:  Well appearing 78 year old man, NAD HEENT: Unremarkable Neck:  7 cm JVD, no thyromegally Lungs:  Clear with no wheezes, rales, or rhonchi. HEART:  Regular rate rhythm, no murmurs, no rubs, no clicks Abd:  soft, obese, positive bowel sounds, no organomegally, no rebound, no guarding Ext:  2 plus pulses, no edema, no cyanosis, no clubbing Skin:  No rashes no nodules Neuro:  CN II through XII intact, motor grossly intact  DEVICE  Normal device function.  See PaceArt for details.   Assess/Plan:

## 2012-01-20 NOTE — Assessment & Plan Note (Signed)
His blood pressure today is slightly elevated. I've asked the patient to reduce his sodium intake, continue his current medical therapy, and increase his physical activity.

## 2012-01-20 NOTE — Assessment & Plan Note (Signed)
His chronic systolic heart failure symptoms are class II. He'll continue his current medical therapy, and maintain a low-sodium diet.

## 2012-02-23 ENCOUNTER — Other Ambulatory Visit: Payer: Self-pay

## 2012-02-23 MED ORDER — LOSARTAN POTASSIUM 50 MG PO TABS: 50.0000 mg | ORAL_TABLET | Freq: Every day | ORAL | Status: DC

## 2012-02-24 ENCOUNTER — Other Ambulatory Visit (HOSPITAL_COMMUNITY): Payer: Self-pay | Admitting: *Deleted

## 2012-02-24 MED ORDER — CARVEDILOL 25 MG PO TABS: 25.0000 mg | ORAL_TABLET | Freq: Two times a day (BID) | ORAL | Status: DC

## 2012-02-24 MED ORDER — SIMVASTATIN 40 MG PO TABS: 40.0000 mg | ORAL_TABLET | Freq: Every day | ORAL | Status: DC

## 2012-02-27 ENCOUNTER — Other Ambulatory Visit: Payer: Self-pay

## 2012-02-27 MED ORDER — LOSARTAN POTASSIUM 50 MG PO TABS: 50.0000 mg | ORAL_TABLET | Freq: Every day | ORAL | Status: DC

## 2012-03-01 ENCOUNTER — Other Ambulatory Visit (HOSPITAL_COMMUNITY): Payer: Self-pay | Admitting: *Deleted

## 2012-03-01 MED ORDER — LOSARTAN POTASSIUM 50 MG PO TABS: 50.0000 mg | ORAL_TABLET | Freq: Every day | ORAL | Status: DC

## 2012-03-01 MED ORDER — SIMVASTATIN 40 MG PO TABS: 40.0000 mg | ORAL_TABLET | Freq: Every day | ORAL | Status: DC

## 2012-03-01 MED ORDER — CARVEDILOL 25 MG PO TABS: 25.0000 mg | ORAL_TABLET | Freq: Two times a day (BID) | ORAL | Status: DC

## 2012-04-26 ENCOUNTER — Encounter: Payer: Medicare Other | Admitting: *Deleted

## 2012-05-05 ENCOUNTER — Encounter: Payer: Self-pay | Admitting: *Deleted

## 2012-05-06 ENCOUNTER — Emergency Department (HOSPITAL_COMMUNITY)
Admission: EM | Admit: 2012-05-06 | Discharge: 2012-05-06 | Disposition: A | Discharge status: Home / Self Care | Payer: Medicare Other | Attending: Emergency Medicine | Admitting: Emergency Medicine

## 2012-05-06 ENCOUNTER — Emergency Department (HOSPITAL_COMMUNITY): Payer: Medicare Other

## 2012-05-06 ENCOUNTER — Encounter (HOSPITAL_COMMUNITY): Payer: Self-pay | Admitting: *Deleted

## 2012-05-06 DIAGNOSIS — Z8679 Personal history of other diseases of the circulatory system: Secondary | ICD-10-CM | POA: Insufficient documentation

## 2012-05-06 DIAGNOSIS — Z79899 Other long term (current) drug therapy: Secondary | ICD-10-CM | POA: Insufficient documentation

## 2012-05-06 DIAGNOSIS — Z8719 Personal history of other diseases of the digestive system: Secondary | ICD-10-CM | POA: Insufficient documentation

## 2012-05-06 DIAGNOSIS — E785 Hyperlipidemia, unspecified: Secondary | ICD-10-CM | POA: Insufficient documentation

## 2012-05-06 DIAGNOSIS — Z7982 Long term (current) use of aspirin: Secondary | ICD-10-CM | POA: Insufficient documentation

## 2012-05-06 DIAGNOSIS — Y929 Unspecified place or not applicable: Secondary | ICD-10-CM | POA: Insufficient documentation

## 2012-05-06 DIAGNOSIS — M545 Low back pain, unspecified: Secondary | ICD-10-CM | POA: Insufficient documentation

## 2012-05-06 DIAGNOSIS — Z9889 Other specified postprocedural states: Secondary | ICD-10-CM | POA: Insufficient documentation

## 2012-05-06 DIAGNOSIS — Z95810 Presence of automatic (implantable) cardiac defibrillator: Secondary | ICD-10-CM | POA: Insufficient documentation

## 2012-05-06 DIAGNOSIS — M549 Dorsalgia, unspecified: Secondary | ICD-10-CM

## 2012-05-06 DIAGNOSIS — I2510 Atherosclerotic heart disease of native coronary artery without angina pectoris: Secondary | ICD-10-CM | POA: Insufficient documentation

## 2012-05-06 DIAGNOSIS — Z862 Personal history of diseases of the blood and blood-forming organs and certain disorders involving the immune mechanism: Secondary | ICD-10-CM | POA: Insufficient documentation

## 2012-05-06 DIAGNOSIS — Z8639 Personal history of other endocrine, nutritional and metabolic disease: Secondary | ICD-10-CM | POA: Insufficient documentation

## 2012-05-06 DIAGNOSIS — S335XXA Sprain of ligaments of lumbar spine, initial encounter: Secondary | ICD-10-CM | POA: Insufficient documentation

## 2012-05-06 DIAGNOSIS — S39012A Strain of muscle, fascia and tendon of lower back, initial encounter: Secondary | ICD-10-CM

## 2012-05-06 DIAGNOSIS — I509 Heart failure, unspecified: Secondary | ICD-10-CM | POA: Insufficient documentation

## 2012-05-06 DIAGNOSIS — R319 Hematuria, unspecified: Secondary | ICD-10-CM | POA: Insufficient documentation

## 2012-05-06 DIAGNOSIS — Z951 Presence of aortocoronary bypass graft: Secondary | ICD-10-CM | POA: Insufficient documentation

## 2012-05-06 DIAGNOSIS — Z87448 Personal history of other diseases of urinary system: Secondary | ICD-10-CM

## 2012-05-06 DIAGNOSIS — X58XXXA Exposure to other specified factors, initial encounter: Secondary | ICD-10-CM | POA: Insufficient documentation

## 2012-05-06 DIAGNOSIS — Y939 Activity, unspecified: Secondary | ICD-10-CM | POA: Insufficient documentation

## 2012-05-06 DIAGNOSIS — E663 Overweight: Secondary | ICD-10-CM | POA: Insufficient documentation

## 2012-05-06 LAB — URINE MICROSCOPIC-ADD ON

## 2012-05-06 LAB — URINALYSIS, ROUTINE W REFLEX MICROSCOPIC
Bilirubin Urine: NEGATIVE
Glucose, UA: NEGATIVE mg/dL
Protein, ur: NEGATIVE mg/dL
Urobilinogen, UA: 0.2 mg/dL (ref 0.0–1.0)

## 2012-05-06 MED ORDER — DIAZEPAM 5 MG PO TABS: 5.0000 mg | ORAL_TABLET | Freq: Three times a day (TID) | ORAL | Status: DC | PRN

## 2012-05-06 MED ORDER — HYDROCODONE-ACETAMINOPHEN 5-325 MG PO TABS: 1.0000 | ORAL_TABLET | Freq: Four times a day (QID) | ORAL | Status: DC | PRN

## 2012-05-06 MED ORDER — HYDROCODONE-ACETAMINOPHEN 5-325 MG PO TABS
2.0000 | ORAL_TABLET | Freq: Once | ORAL | Status: AC
  Administered 2012-05-06: 2 via ORAL
  Filled 2012-05-06: qty 2

## 2012-05-06 NOTE — ED Notes (Signed)
Patient says he has chronic back pain but it had not hurt him in a very long time.  The patient said he started having back pain three days ago.  He advised it takes a long time to get an appoint ment with his regular doctor so he decided to come to the ED because his pain got worse.

## 2012-05-06 NOTE — ED Notes (Signed)
Pt has hx of intermittent bil lower back pain.  However, in recent weeks he has been experiencing increasing R-sided lower back pain (not flank).  Pain increases when he lays on it or changes positions.  Also c/o increasingly dark urine.

## 2012-05-06 NOTE — ED Provider Notes (Signed)
History  This chart was scribed for non-physician practitioner Dierdre Forth, PA-C working with Ethelda Chick, MD, by Candelaria Stagers, ED Scribe. This patient was seen in room TR09C/TR09C and the patient's care was started at 4:34 PM   CSN: [id number]  Arrival date & time 05/06/12  1350   First MD Initiated Contact with Patient 05/06/12 1633      Chief Complaint  Patient presents with  . Back Pain     The history is provided by the patient. No language interpreter was used.   Joseph Malone is a 79 y.o. male who presents to the Emergency Department complaining of chronic right lower back pain that became worse about one week ago and has gradually worsened.  Pt reports he had trouble getting off the floor after working on his lawnmower and states that the pain has gotten worse since then.  Lying down and changing positions makes the pain better while getting up from seated or lying position makes the pain worse.  He denies numbness or weakness to legs, loss of bowel or bladder control, dysuria, or trouble urinating.  Pt states that he has noticed dark urine over the last few days.  Pt has h/o disc problems with no interventions.  He has taken tylenol and ibuprofen with mild relief.  He denies h/o cancer, IV drug use, or immunosuppressant situations.      His PCP is Dr. Alwyn Ren.  He does not have an orthopaedist.   Past Medical History  Diagnosis Date  . CAD (coronary artery disease)     s/p CABG  . Heart attack 2001  . Heart failure     CHF due to ischemic CM  . Ischemic cardiomyopathy   . Excess weight   . Hypertension   . Hyperlipidemia   . Fasting hyperglycemia   . BPH (benign prostatic hypertrophy)   . Microscopic hematuria     Dr. Wanda Plump  . Diverticulosis     Past Surgical History  Procedure Laterality Date  . Coronary artery bypass graft  2000    1 vessel  . Appendectomy    . Pilonidal cyst excision    . Cataract extraction  2008    bilateral with lens  implant  . Colonoscopy  2004    Tics; [facility]  . Transthoracic echocardiogram  08/29/2005  . Cardiac defibrillator placement  2005    Medtronic  . Cardiac catheterization  2000    Family History  Problem Relation Age of Onset  . Hypertension Father   . Heart attack Father 31  . Diabetes Brother     X 2  . Coronary artery disease Brother   . Heart attack Paternal Uncle 72  . Sudden death Paternal Grandfather 45  . Lung cancer Sister     "Asian lung cancer"  . Stroke Neg Hx     History  Substance Use Topics  . Smoking status: Never Smoker   . Smokeless tobacco: Not on file  . Alcohol Use: No      Review of Systems  Gastrointestinal: Negative for nausea and vomiting.  Genitourinary: Negative for dysuria and urgency.  Musculoskeletal: Positive for back pain (right lower back pain). Negative for gait problem.  All other systems reviewed and are negative.    Allergies  Penicillins  Home Medications   Current Outpatient Rx  Name  Route  Sig  Dispense  Refill  . acetaminophen (TYLENOL) 500 MG tablet   Oral   Take 1,000 mg by mouth  3 (three) times daily as needed for pain.         Marland Kitchen aspirin 325 MG tablet   Oral   Take 325 mg by mouth every evening.          . carvedilol (COREG) 25 MG tablet   Oral   Take 1 tablet (25 mg total) by mouth 2 (two) times daily.   180 tablet   3   . Cholecalciferol (VITAMIN D3) 2000 UNITS TABS   Oral   Take 2,000 Units by mouth every evening.          . Flaxseed, Linseed, (FLAX SEED OIL) 1000 MG CAPS   Oral   Take 1,000 mg by mouth every evening.          . folic acid (FOLVITE) 400 MCG tablet   Oral   Take 400 mcg by mouth daily.           . Glucosamine HCl 1500 MG TABS   Oral   Take 1,500 mg by mouth every evening.          Marland Kitchen ibuprofen (ADVIL,MOTRIN) 200 MG tablet   Oral   Take 400 mg by mouth daily as needed for pain.         Marland Kitchen losartan (COZAAR) 50 MG tablet   Oral   Take 1 tablet (50 mg total) by  mouth at bedtime.   90 tablet   3   . Multiple Vitamin (MULTIVITAMIN WITH MINERALS) TABS   Oral   Take 1 tablet by mouth daily.          . sildenafil (VIAGRA) 100 MG tablet   Oral   Take 100 mg by mouth daily as needed for erectile dysfunction.         . simvastatin (ZOCOR) 40 MG tablet   Oral   Take 1 tablet (40 mg total) by mouth at bedtime.   90 tablet   3   . diazepam (VALIUM) 5 MG tablet   Oral   Take 1 tablet (5 mg total) by mouth every 8 (eight) hours as needed for anxiety (Take 1 tablet every 8 hours as needed for muscle spasms.).   20 tablet   0   . glucose blood test strip   Other   1 each by Other route as needed. Freestyle Lite, test once daily          . HYDROcodone-acetaminophen (NORCO/VICODIN) 5-325 MG per tablet   Oral   Take 1 tablet by mouth every 6 (six) hours as needed for pain (Take 1 - 2 tablets every 4 - 6 hours.).   20 tablet   0   . Lancet Device MISC   Does not apply   by Does not apply route. Freestlyle Lite Lancet, once daily            BP 164/101  Pulse 96  Temp(Src) 97.4 F (36.3 C) (Oral)  Resp 18  SpO2 95%  Physical Exam  Nursing note and vitals reviewed. Constitutional: He appears well-developed and well-nourished. No distress.  HENT:  Head: Normocephalic and atraumatic.  Mouth/Throat: Oropharynx is clear and moist. No oropharyngeal exudate.  Eyes: Conjunctivae are normal.  Neck: Normal range of motion. Neck supple.  Full ROM without pain  Cardiovascular: Normal rate, regular rhythm and intact distal pulses.   Pulmonary/Chest: Effort normal and breath sounds normal. No respiratory distress. He has no wheezes.  Abdominal: Soft. He exhibits no distension. There is no tenderness.  Musculoskeletal:  Full range  of motion of the T-spine and L-spine No tenderness to palpation of the spinous processes of the T-spine or L-spine.  No para spinal tenderness.  Left L-spine para spinal and left SI joint tenderness.     Lymphadenopathy:    He has no cervical adenopathy.  Neurological: He is alert. He has normal reflexes.  Speech is clear and goal oriented, follows commands Normal strength in upper and lower extremities bilaterally including dorsiflexion and plantar flexion, strong and equal grip strength.  Sensation normal to light and sharp touch. Moves extremities without ataxia, coordination intact Normal gait Normal balance   Skin: Skin is warm and dry. No rash noted. He is not diaphoretic. No erythema.  Psychiatric: He has a normal mood and affect. His behavior is normal.    ED Course  Procedures   DIAGNOSTIC STUDIES: Oxygen Saturation is 95% on room air, normal by my interpretation.    COORDINATION OF CARE:  4:41 PM Discussed course of care with pt which includes urinalysis and prostate exam dependant on urinalysis results.  Pt understands and agrees.   5:55 PM Consult with Dr. Karma Ganja who advised imaging of spine.    Labs Reviewed  URINALYSIS, ROUTINE W REFLEX MICROSCOPIC - Abnormal; Notable for the following:    Hgb urine dipstick SMALL (*)    All other components within normal limits  URINE MICROSCOPIC-ADD ON - Abnormal; Notable for the following:    Casts HYALINE CASTS (*)    All other components within normal limits   Dg Lumbar Spine Complete  05/06/2012  *RADIOLOGY REPORT*  Clinical Data: Back pain.  LUMBAR SPINE - COMPLETE 4+ VIEW  Comparison: None  Findings: There is a mild left convex lumbar scoliosis with associated advanced degenerative lumbar spondylosis.  Multilevel disc disease and facet disease but no acute bony findings or destructive bony changes.  The facets are normally aligned.  No pars defects.  The visualized bony pelvis is intact.  IMPRESSION:  1.  Mild scoliosis and advanced on degenerative lumbar spondylosis. 2.  No acute findings.   Original Report Authenticated By: Rudie Meyer, M.D.      1. Back pain   2. HEMATURIA, HX OF   3. Low back pain   4. Strain of  lumbar region, initial encounter [847.2]       MDM  Eliberto Ivory presents with acute exacerbation of back pain.  Patient with a Hx of chronic back pain which has not given him difficulty in some time.  No neurological deficits and normal neuro exam.  Patient can walk but states is painful.  No loss of bowel or bladder control.  No concern for cauda equina.  No fever, night sweats, weight loss, h/o cancer, IVDU.  X-ray without evidence of compression fractures.  I personally reviewed the imaging tests through PACS system.  I reviewed available ER/hospitalization records through the EMR.  UA without evidence of UTI.  Pt with hematuria, but this has been an ongoing problem for him.  No evidence of kidney failure. RICE protocol and pain medicine indicated and discussed with patient. I have also discussed reasons to return immediately to the ER.  Patient expresses understanding and agrees with plan.  Dr. Jerelyn Scott was consulted and agrees with the plan.    I personally performed the services described in this documentation, which was scribed in my presence. The recorded information has been reviewed and is accurate.     Dahlia Client Cabria Micalizzi, PA-C 05/07/12 [phone]

## 2012-05-08 NOTE — ED Provider Notes (Signed)
Medical screening examination/treatment/procedure(s) were performed by non-physician practitioner and as supervising physician I was immediately available for consultation/collaboration.  Ethelda Chick, MD 05/08/12 [phone]

## 2012-05-12 ENCOUNTER — Telehealth: Payer: Self-pay | Admitting: Internal Medicine

## 2012-05-12 ENCOUNTER — Ambulatory Visit (INDEPENDENT_AMBULATORY_CARE_PROVIDER_SITE_OTHER): Payer: Medicare Other | Admitting: *Deleted

## 2012-05-12 ENCOUNTER — Other Ambulatory Visit: Payer: Self-pay | Admitting: Internal Medicine

## 2012-05-12 DIAGNOSIS — I2589 Other forms of chronic ischemic heart disease: Secondary | ICD-10-CM

## 2012-05-12 DIAGNOSIS — Z95810 Presence of automatic (implantable) cardiac defibrillator: Secondary | ICD-10-CM

## 2012-05-12 NOTE — Telephone Encounter (Signed)
N/A at this time/kwm  

## 2012-05-12 NOTE — Telephone Encounter (Signed)
New Prob   Pt sent a transmission 3/17 and got a letter it wasn't received. Would like to know if and when he should resend. Would like to speak to someone.

## 2012-05-13 ENCOUNTER — Ambulatory Visit (INDEPENDENT_AMBULATORY_CARE_PROVIDER_SITE_OTHER): Payer: Medicare Other | Admitting: Internal Medicine

## 2012-05-13 ENCOUNTER — Encounter: Payer: Self-pay | Admitting: Internal Medicine

## 2012-05-13 VITALS — BP 150/88 | HR 66 | Temp 97.7°F | Wt 253.0 lb

## 2012-05-13 DIAGNOSIS — IMO0001 Reserved for inherently not codable concepts without codable children: Secondary | ICD-10-CM

## 2012-05-13 DIAGNOSIS — R3129 Other microscopic hematuria: Secondary | ICD-10-CM

## 2012-05-13 DIAGNOSIS — M545 Low back pain, unspecified: Secondary | ICD-10-CM

## 2012-05-13 LAB — POCT URINALYSIS DIPSTICK
Bilirubin, UA: NEGATIVE
Glucose, UA: NEGATIVE
Spec Grav, UA: 1.02

## 2012-05-13 NOTE — Patient Instructions (Addendum)
The best exercises for the low back include freestyle swimming, stretch aerobics, and yoga. 

## 2012-05-13 NOTE — Progress Notes (Signed)
  Subjective:    Patient ID: Joseph Malone, male    DOB: 09/07/1930, 79 y.o.   MRN: [id number]  HPI BACK PAIN: Back pain began in the R lumbosacral spine area in the context of riding a lawnmower  1 week ago. The pain is described as dull  and non radiating to legs but into R hip. The pain is worse sitting. Pain has improved with  nonsteroidals & Tylenol  Initially . Narcotics & Valium from ER helped. He has not initiated the back exercises recommended by the emergency room No associated numbness and tingling or limb weakness has been present                                                                           Review of Systems Constitutional: No fever, chills, sweats, unexplained weight loss GI: No abdominal pain;  melena; rectal bleeding GU: No hematuria, pyuria, or dysuria. His urine had been dark; urinalysis revealed trace hematuria. MS: No joint stiffness;redness Heme/Lymph:No abnormal bruising or bleeding       Objective:   Physical Exam Gen.: Healthy and well-nourished in appearance. Alert, appropriate and cooperative throughout exam.  Eyes: No corneal or conjunctival inflammation noted. No icterus Neck: No deformities, masses, or tenderness noted. Range of motion normal Abdomen: Bowel sounds normal; abdomen soft and nontender. No masses, organomegaly or hernias noted.  No AAA                                Musculoskeletal/extremities: There is some asymmetry of the posterior thoracic musculature suggesting occult scoliosis. Range of motion normal .Tone & strength  normal.Joints normal / reveal mild  DJD DIP changes. Nail health good. Able to lie down & sit up w/o help; but he exhibits the "classic low back crawl". Negative SLR bilaterally Vascular:  dorsalis pedis and  posterior tibial pulses are full and equal. No bruits present. Neurologic: Alert and oriented x3. Deep tendon reflexes symmetrical and normal. Gait including heel & toe walking normal.        Skin: Intact  without suspicious lesions or rashes. And Psych: Mood and affect are normal. Normally interactive                     Assessment & Plan:  #1 classic low back musculoskeletal syndrome in the context of degenerative disc disease and scoliosis  #2 trace hematuria  Plan: He should initiate the back exercises as recommended. He can use a half a Valium at bedtime for muscle relaxation until symptoms resolve. If symptoms persist or progress; physical therapy recommended

## 2012-05-13 NOTE — Addendum Note (Signed)
Addended by: Maurice Small on: 05/13/2012 10:04 AM   Modules accepted: Orders

## 2012-05-13 NOTE — Telephone Encounter (Signed)
Spoke w/pt to let know transmission was received on 05-12-12.

## 2012-05-14 LAB — REMOTE ICD DEVICE
BRDY-0002RV: 40 {beats}/min
DEV-0020ICD: NEGATIVE
RV LEAD AMPLITUDE: 6.4 mv
TOT-0006: 20131210000000
TZAT-0001FASTVT: 1
TZAT-0004FASTVT: 8
TZAT-0004SLOWVT: 8
TZAT-0004SLOWVT: 8
TZAT-0005SLOWVT: 84 pct
TZAT-0005SLOWVT: 91 pct
TZAT-0011SLOWVT: 10 ms
TZAT-0011SLOWVT: 10 ms
TZAT-0012SLOWVT: 200 ms
TZAT-0012SLOWVT: 200 ms
TZAT-0013FASTVT: 1
TZAT-0013SLOWVT: 2
TZAT-0013SLOWVT: 2
TZAT-0020FASTVT: 1.6 ms
TZON-0003SLOWVT: 360 ms
TZON-0004SLOWVT: 16
TZON-0005SLOWVT: 12
TZON-0010FASTVT: 30 ms
TZST-0001FASTVT: 2
TZST-0001FASTVT: 3
TZST-0001FASTVT: 5
TZST-0001SLOWVT: 3
TZST-0001SLOWVT: 4
TZST-0001SLOWVT: 6
TZST-0002FASTVT: NEGATIVE
TZST-0002FASTVT: NEGATIVE
TZST-0002FASTVT: NEGATIVE
TZST-0002SLOWVT: NEGATIVE
TZST-0002SLOWVT: NEGATIVE
TZST-0003FASTVT: 20 J
TZST-0003FASTVT: 35 J
TZST-0003FASTVT: 35 J
TZST-0003SLOWVT: 35 J

## 2012-05-15 LAB — URINE CULTURE: Organism ID, Bacteria: NO GROWTH

## 2012-05-18 ENCOUNTER — Telehealth: Payer: Self-pay | Admitting: Internal Medicine

## 2012-05-18 ENCOUNTER — Other Ambulatory Visit: Payer: Self-pay | Admitting: Internal Medicine

## 2012-05-18 DIAGNOSIS — R3129 Other microscopic hematuria: Secondary | ICD-10-CM

## 2012-05-18 NOTE — Telephone Encounter (Signed)
Hopp please advise  

## 2012-05-18 NOTE — Telephone Encounter (Signed)
Patient would like to know who Dr. Alwyn Ren recommends he sees for his urology appointment. He saw [facility] about 10 years ago but doesn't really care who he goes to.

## 2012-05-20 ENCOUNTER — Telehealth: Payer: Self-pay | Admitting: Internal Medicine

## 2012-05-20 NOTE — Telephone Encounter (Signed)
Patient called office stating that he has seen Dr. Boston Service for urology issues in the past and would like to go back to him. Also patient stated that he has taken all Valium given to him in the ED and is requesting a script for some more. Office visit note states that pt may need referral for PT if sx persist. Want to give more valium and/or PT referral? Please advise.

## 2012-05-20 NOTE — Telephone Encounter (Signed)
Dr. Wanda Plump is no longer in practice; I was told that he is working in [place].  Long-term Valium is not advisable as it stays in the system up to 24 hours or longer. They can impact level of alertness and increase risk of falls. I recommend cyclobenzaprine 5 mg one-2 at bedtime as needed for muscle spasm. Dispense 14. If back pain  persists; physical therapy or Chiropractry referral  indicated

## 2012-05-20 NOTE — Telephone Encounter (Signed)
Dr.Hopper please advise Side Note: Patient will need to sign Controlled Substance Contract

## 2012-06-08 ENCOUNTER — Encounter: Payer: Self-pay | Admitting: *Deleted

## 2012-06-09 ENCOUNTER — Encounter: Payer: Self-pay | Admitting: Internal Medicine

## 2012-06-10 HISTORY — PX: CYSTOSCOPY: SUR368

## 2012-07-12 ENCOUNTER — Ambulatory Visit (INDEPENDENT_AMBULATORY_CARE_PROVIDER_SITE_OTHER): Payer: Medicare Other | Admitting: Internal Medicine

## 2012-07-12 ENCOUNTER — Encounter: Payer: Self-pay | Admitting: Internal Medicine

## 2012-07-12 VITALS — BP 134/88 | HR 85 | Temp 98.4°F | Resp 12 | Ht 71.08 in | Wt 252.0 lb

## 2012-07-12 DIAGNOSIS — D485 Neoplasm of uncertain behavior of skin: Secondary | ICD-10-CM

## 2012-07-12 DIAGNOSIS — R109 Unspecified abdominal pain: Secondary | ICD-10-CM

## 2012-07-12 DIAGNOSIS — R911 Solitary pulmonary nodule: Secondary | ICD-10-CM | POA: Insufficient documentation

## 2012-07-12 DIAGNOSIS — R319 Hematuria, unspecified: Secondary | ICD-10-CM

## 2012-07-12 DIAGNOSIS — N200 Calculus of kidney: Secondary | ICD-10-CM

## 2012-07-12 LAB — POCT URINALYSIS DIPSTICK
Bilirubin, UA: NEGATIVE
Leukocytes, UA: NEGATIVE
Nitrite, UA: NEGATIVE
Protein, UA: NEGATIVE
pH, UA: 6

## 2012-07-12 NOTE — Assessment & Plan Note (Signed)
This tiny nodule will be reassessed at the time of this repeat CT of the abdomen/pelvis in November/2014.

## 2012-07-12 NOTE — Progress Notes (Signed)
  Subjective:    Patient ID: Joseph Malone, male    DOB: [birth year], 79 y.o.   MRN: [id number]  HPI He was scheduled for a Medicare wellness exam; but  it is too early for that exam.  He's concerned because of incidental finding of a tiny nodule on CT of the abdomen and pelvis done for microscopic hematuria (see updated data in the problem list). 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  Yesterday he had some discomfort in the suprapubic area and noted the passage of some blood. His extensive urologic evaluation of microscopic hematuria was reviewed and data updated in the problem list     Review of Systems He has some cough with intermittent wheezing particularly positionally at night. He has no history of asthma. He specifically denies any significant sputum production or hemoptysis. He is not on ACE inhibitor.  He denies significant symptoms of rhinosinusitis. He has had some itchy, watery eyes without significant sneezing. He describes some clicking in the right ear.  He has a small lesion of the left temple which has never healed completely over the last 24 months despite topical creams and agents.       Objective:   Physical Exam General appearance:good health ;well nourished; no acute distress or increased work of breathing is present.  No  lymphadenopathy about the head, neck, or axilla noted. Osteoma L forehead  Eyes: No conjunctival inflammation or lid edema is present.   Ears:  External ear exam shows no significant lesions or deformities.  Otoscopic examination reveals clear canals, tympanic membranes are intact bilaterally without bulging, retraction, inflammation or discharge.TMs dull  Nose:  External nasal examination shows no deformity or inflammation. Nasal mucosa are pink and moist without lesions or exudates. No septal dislocation or deviation.No obstruction to airflow.   Oral exam: Dental hygiene  is good; lips and gums are healthy appearing.There is no oropharyngeal erythema or exudate noted.   Neck:  No deformities,  masses, or tenderness noted.     Heart:  Normal rate but slightly irregular rhythm. S1 and S2 normal without gallop, murmur, click, rub or other extra sounds.   Lungs:Chest clear to auscultation; no wheezes, rhonchi,rales ,or rubs present.No increased work of breathing.    Abdomen: No organomegaly or masses present. Abdomen nontender  Extremities:  No cyanosis, edema, or clubbing  noted   Skin: Small crusted lesion left temple   Skin: Warm & dry         Assessment & Plan:  #1 tiny pulmonary nodule; long-term risk would be extremely low. He does have a repeat CT scan in November which would reassess this tiny nodule. The concept of doubling time of nodules was discussed with him.  # 2 neoplasm left temple, uncertain etiology  #3 hematuria  # 4 minor cough and upper respiratory tract symptoms. If cough or wheezing progress; I do recommend pulmonary function test. Allegra, Claritin, or nighttime Zyrtec recommended for any extrinsic symptoms.      Plan: see Orders

## 2012-07-12 NOTE — Patient Instructions (Addendum)
If you activate the  My Chart system; lab & Xray results will be released directly  to you as soon as I review & address these through the computer. If you choose not to sign up for My Chart within 36 hours of labs being drawn; results will be reviewed & interpretation added before being copied & mailed, causing a delay in getting the results to you.If you do not receive that report within 7-10 days ,please call. Additionally you can use this system to gain direct  access to your records  if  out of town or @ an office of a  physician who is not in  the My Chart network.  This improves continuity of care & places you in control of your medical record.   Plain Mucinex (NOT D) for thick secretions ;force NON dairy fluids .   Use a Neti pot daily only  as needed for significant sinus congestion; going from open side to congested side . Plain Allegra (NOT D )  160 daily , Loratidine 10 mg , OR Zyrtec 10 mg @ bedtime  as needed for itchy eyes & sneezing.

## 2012-07-13 ENCOUNTER — Encounter: Payer: Self-pay | Admitting: Internal Medicine

## 2012-08-04 ENCOUNTER — Encounter: Payer: Self-pay | Admitting: Gastroenterology

## 2012-08-16 ENCOUNTER — Ambulatory Visit (INDEPENDENT_AMBULATORY_CARE_PROVIDER_SITE_OTHER): Payer: Medicare Other | Admitting: *Deleted

## 2012-08-16 DIAGNOSIS — Z95810 Presence of automatic (implantable) cardiac defibrillator: Secondary | ICD-10-CM

## 2012-08-16 DIAGNOSIS — I2589 Other forms of chronic ischemic heart disease: Secondary | ICD-10-CM

## 2012-08-17 LAB — REMOTE ICD DEVICE
CHARGE TIME: 10.54 s
RV LEAD IMPEDENCE ICD: 632 Ohm
TZAT-0002SLOWVT: NEGATIVE
TZAT-0004FASTVT: 8
TZAT-0004SLOWVT: 8
TZAT-0004SLOWVT: 8
TZAT-0005FASTVT: 88 pct
TZAT-0005SLOWVT: 84 pct
TZAT-0005SLOWVT: 91 pct
TZAT-0011SLOWVT: 10 ms
TZAT-0011SLOWVT: 10 ms
TZAT-0012FASTVT: 200 ms
TZAT-0012SLOWVT: 200 ms
TZAT-0012SLOWVT: 200 ms
TZAT-0013FASTVT: 1
TZAT-0019SLOWVT: 8 V
TZON-0003SLOWVT: 360 ms
TZON-0008SLOWVT: 0 ms
TZON-0010AFLUTTER: 30 ms
TZON-0011AFLUTTER: 70
TZST-0001FASTVT: 2
TZST-0001FASTVT: 5
TZST-0001SLOWVT: 4
TZST-0002FASTVT: NEGATIVE
TZST-0002FASTVT: NEGATIVE
TZST-0002SLOWVT: NEGATIVE
TZST-0002SLOWVT: NEGATIVE
TZST-0002SLOWVT: NEGATIVE
TZST-0003FASTVT: 35 J
TZST-0003FASTVT: 35 J
TZST-0003SLOWVT: 35 J
TZST-0003SLOWVT: 35 J
TZST-0003SLOWVT: 35 J

## 2012-08-20 ENCOUNTER — Encounter: Payer: Self-pay | Admitting: *Deleted

## 2012-08-27 ENCOUNTER — Encounter: Payer: Self-pay | Admitting: Internal Medicine

## 2012-09-29 ENCOUNTER — Encounter: Payer: Self-pay | Admitting: Internal Medicine

## 2012-09-29 DIAGNOSIS — C4491 Basal cell carcinoma of skin, unspecified: Secondary | ICD-10-CM | POA: Insufficient documentation

## 2012-10-26 ENCOUNTER — Encounter: Payer: Self-pay | Admitting: Internal Medicine

## 2012-11-15 ENCOUNTER — Telehealth: Payer: Self-pay

## 2012-11-15 NOTE — Telephone Encounter (Signed)
LM for C/B. 

## 2012-11-16 ENCOUNTER — Encounter: Payer: Self-pay | Admitting: Internal Medicine

## 2012-11-16 ENCOUNTER — Ambulatory Visit (INDEPENDENT_AMBULATORY_CARE_PROVIDER_SITE_OTHER): Payer: Medicare Other | Admitting: Internal Medicine

## 2012-11-16 VITALS — BP 123/81 | HR 60 | Temp 98.3°F | Ht 71.25 in | Wt 252.0 lb

## 2012-11-16 DIAGNOSIS — K5730 Diverticulosis of large intestine without perforation or abscess without bleeding: Secondary | ICD-10-CM

## 2012-11-16 DIAGNOSIS — Z0000 Encounter for general adult medical examination without abnormal findings: Secondary | ICD-10-CM

## 2012-11-16 DIAGNOSIS — R7309 Other abnormal glucose: Secondary | ICD-10-CM

## 2012-11-16 DIAGNOSIS — R5381 Other malaise: Secondary | ICD-10-CM

## 2012-11-16 DIAGNOSIS — E785 Hyperlipidemia, unspecified: Secondary | ICD-10-CM

## 2012-11-16 DIAGNOSIS — I10 Essential (primary) hypertension: Secondary | ICD-10-CM

## 2012-11-16 LAB — HEPATIC FUNCTION PANEL
ALT: 29 U/L (ref 0–53)
AST: 26 U/L (ref 0–37)
Alkaline Phosphatase: 48 U/L (ref 39–117)
Bilirubin, Direct: 0 mg/dL (ref 0.0–0.3)
Total Bilirubin: 0.6 mg/dL (ref 0.3–1.2)

## 2012-11-16 LAB — TSH: TSH: 0.38 u[IU]/mL (ref 0.35–5.50)

## 2012-11-16 LAB — HEMOGLOBIN A1C: Hgb A1c MFr Bld: 6.6 % — ABNORMAL HIGH (ref 4.6–6.5)

## 2012-11-16 LAB — BASIC METABOLIC PANEL
BUN: 18 mg/dL (ref 6–23)
Chloride: 107 mEq/L (ref 96–112)
Creatinine, Ser: 1.1 mg/dL (ref 0.4–1.5)
GFR: 69.05 mL/min (ref >60.00)
Potassium: 4.1 mEq/L (ref 3.5–5.1)

## 2012-11-16 LAB — CBC WITH DIFFERENTIAL/PLATELET
Basophils Relative: 1 % (ref 0.0–3.0)
Eosinophils Relative: 4.5 % (ref 0.0–5.0)
MCV: 93.2 fl (ref 78.0–100.0)
Monocytes Relative: 7.7 % (ref 3.0–12.0)
Neutrophils Relative %: 57.6 % (ref 43.0–77.0)
Platelets: 169 10*3/uL (ref 150.0–400.0)
RBC: 4.61 Mil/uL (ref 4.22–5.81)
WBC: 7.4 10*3/uL (ref 4.5–10.5)

## 2012-11-16 LAB — MICROALBUMIN / CREATININE URINE RATIO
Creatinine,U: 221.1 mg/dL
Microalb, Ur: 1.6 mg/dL (ref 0.0–1.9)

## 2012-11-16 LAB — LIPID PANEL
LDL Cholesterol: 51 mg/dL (ref 0–99)
Total CHOL/HDL Ratio: 3
VLDL: 27.4 mg/dL (ref 0.0–40.0)

## 2012-11-16 NOTE — Telephone Encounter (Signed)
Computer logged me out.  Meds and allergies: done  90day mail order [facility]  HM UTD: No  A/P: HM due: flu vaccine, 2nd PNA if applicable  PSA: Has urologist Last:06/11/2012 CCS: due Last: 09/2002 DM: due HTN: due

## 2012-11-16 NOTE — Telephone Encounter (Signed)
Medication List and allergies: done  Pharmacy updated, uses [facility] for 90 day supply Pharmacy undated, uses [facility]  for local prescriptions  HM UTD:  A/P: HM due:  PSA: Urologist Last:06/11/2012 CCS: due Last: 09/2002 DM: due  HTN:  To Discuss with Provider:

## 2012-11-16 NOTE — Patient Instructions (Addendum)
If you activate the  My Chart system; lab & Xray results will be released directly  to you as soon as I review & address these through the computer. If you choose not to sign up for My Chart within 36 hours of labs being drawn; results will be reviewed & interpretation added before being copied & mailed, causing a delay in getting the results to you.If you do not receive that report within 7-10 days ,please call. Additionally you can use this system to gain direct  access to your records  if  out of town or @ an office of a  physician who is not in  the My Chart network.  This improves continuity of care & places you in control of your medical record.  Share results with all non [facility] medical staff seen  

## 2012-11-16 NOTE — Progress Notes (Signed)
Subjective:    Patient ID: Joseph Malone, male    DOB: Sep 08, 1930, 79 y.o.   MRN: [id number]  HPI Medicare Wellness Visit: Psychosocial and medical history were reviewed as required by Medicare (abuse, antisocial behavior risk, forearm risk). Social history: Caffeine: cola 3 X/week  , Alcohol:no  , Tobacco use:no Exercise:see below Personal safety/fall risk:no Limitations of activities of daily living:no Seatbelt smoke alarm use:yes Healthcare Power of Attorney/Living Will status: needed Ophthalmologic exam status:current Hearing evaluation status:not current Orientation: Oriented X3 Memory and recall: good Spelling testing: good Depression/anxiety assessment: denied Foreign travel history:2004 [place] Immunization status the shingles/bleeding/pneumonia/tetanus: Shingles needed Transfusion history:no Preventive health care maintenance status: Colonoscopy as per protocol/standard care:aged out Dental care:every 3 mos Chart reviewed and updated. Active issues reviewed and addressed.    Review of Systems He is on a modified heart healthy diet; he is not exercising due to fatigue. Specifically he has chronic recurrent chest pain which has been evaluated in detail repeatedly. No palpitations. Some exertional  Dyspnea over 12 months. No claudication.  Family history is negative for premature coronary disease.  There is medication compliance with the statin. Significant abdominal signs/symptoms, memory deficit, or myalgias denied. No severe snoring or apnea reported.     Objective:   Physical Exam Gen.:  well-nourished in appearance. Alert, appropriate and cooperative throughout exam. Appears younger than stated age  Head: Normocephalic with boss L forehead ; pattern alopecia  Eyes: No corneal or conjunctival inflammation noted. Pupils equal round reactive to light and accommodation.  Extraocular motion intact. Vision grossly normal without lenses Ears: External  ear exam reveals no  significant lesions or deformities. Canals clear .TMs normal. Hearing is grossly normal bilaterally. Nose: External nasal exam reveals no deformity or inflammation. Nasal mucosa are pink and moist. No lesions or exudates noted.  Mouth: Oral mucosa and oropharynx reveal no lesions or exudates. Teeth in good repair. Neck: No deformities, masses, or tenderness noted. Range of motion decreased. Thyroid normal. Lungs: Normal respiratory effort; chest expands symmetrically. Lungs are clear to auscultation without rales, wheezes, or increased work of breathing. Heart: Normal rate and rhythm. Normal S1 and S2. No gallop, click, or rub. S4 with slurring at  LSB; no murmur. Abdomen: Bowel sounds normal; abdomen soft and nontender. No masses, organomegaly . Ventral hernia noted. Genitalia: As per Dr Margarita Grizzle                                 Musculoskeletal/extremities: Slight accentuated curvature of upper thoracic  Spine. No clubbing, cyanosis, edema, or significant extremity  deformity noted. Range of motion normal .Tone & strength  Normal. Joints normal / reveal mild  DJD DIP changes. Nail health good. Able to lie down & sit up w/o help. Negative SLR bilaterally Vascular: Carotid, radial artery, dorsalis pedis and  posterior tibial pulses are full and equal. No bruits present. Neurologic: Alert and oriented x3. Deep tendon reflexes symmetrical and normal.   Skin: Intact without suspicious lesions or rashes. Lymph: No cervical, axillary lymphadenopathy present. Psych: Mood and affect are normal. Normally interactive  Assessment & Plan:  #1 Medicare Wellness Exam; criteria met ; data entered #2 Problem List/Diagnoses reviewed #3 fatigue & DOE , ? deconditioning Plan:  Assessments made/ Orders entered

## 2012-11-17 ENCOUNTER — Other Ambulatory Visit: Payer: Self-pay | Admitting: Internal Medicine

## 2012-11-17 DIAGNOSIS — R946 Abnormal results of thyroid function studies: Secondary | ICD-10-CM

## 2012-11-17 DIAGNOSIS — E119 Type 2 diabetes mellitus without complications: Secondary | ICD-10-CM

## 2012-11-18 ENCOUNTER — Encounter: Payer: Self-pay | Admitting: *Deleted

## 2012-11-18 NOTE — Progress Notes (Signed)
Letter mailed to patient.

## 2012-11-22 ENCOUNTER — Ambulatory Visit (INDEPENDENT_AMBULATORY_CARE_PROVIDER_SITE_OTHER): Payer: Medicare Other | Admitting: *Deleted

## 2012-11-22 DIAGNOSIS — Z95810 Presence of automatic (implantable) cardiac defibrillator: Secondary | ICD-10-CM

## 2012-11-22 DIAGNOSIS — I2589 Other forms of chronic ischemic heart disease: Secondary | ICD-10-CM

## 2012-11-23 LAB — REMOTE ICD DEVICE
CHARGE TIME: 10.89 s
RV LEAD AMPLITUDE: 5.6 mv
TZAT-0001FASTVT: 1
TZAT-0002FASTVT: NEGATIVE
TZAT-0004FASTVT: 8
TZAT-0004SLOWVT: 8
TZAT-0004SLOWVT: 8
TZAT-0005FASTVT: 88 pct
TZAT-0005SLOWVT: 84 pct
TZAT-0011FASTVT: 10 ms
TZAT-0011SLOWVT: 10 ms
TZAT-0011SLOWVT: 10 ms
TZAT-0012SLOWVT: 200 ms
TZAT-0012SLOWVT: 200 ms
TZAT-0013FASTVT: 1
TZAT-0020SLOWVT: 1.6 ms
TZAT-0020SLOWVT: 1.6 ms
TZON-0003SLOWVT: 360 ms
TZON-0010AFLUTTER: 30 ms
TZON-0011AFLUTTER: 70
TZST-0001FASTVT: 2
TZST-0001FASTVT: 3
TZST-0001FASTVT: 5
TZST-0001SLOWVT: 5
TZST-0002FASTVT: NEGATIVE
TZST-0002FASTVT: NEGATIVE
TZST-0002SLOWVT: NEGATIVE
TZST-0002SLOWVT: NEGATIVE
TZST-0003FASTVT: 35 J
TZST-0003FASTVT: 35 J
TZST-0003SLOWVT: 35 J
TZST-0003SLOWVT: 35 J

## 2012-12-01 ENCOUNTER — Encounter: Payer: Self-pay | Admitting: *Deleted

## 2012-12-08 ENCOUNTER — Encounter: Payer: Self-pay | Admitting: Internal Medicine

## 2012-12-17 ENCOUNTER — Encounter: Payer: Self-pay | Admitting: Internal Medicine

## 2012-12-29 ENCOUNTER — Ambulatory Visit (HOSPITAL_COMMUNITY)
Admission: RE | Admit: 2012-12-29 | Discharge: 2012-12-29 | Disposition: A | Discharge status: Home / Self Care | Payer: Medicare Other | Source: Ambulatory Visit | Attending: Internal Medicine | Admitting: Internal Medicine

## 2012-12-29 ENCOUNTER — Ambulatory Visit (INDEPENDENT_AMBULATORY_CARE_PROVIDER_SITE_OTHER): Payer: Medicare Other | Admitting: *Deleted

## 2012-12-29 VITALS — BP 124/76 | HR 73 | Wt 252.5 lb

## 2012-12-29 DIAGNOSIS — Z951 Presence of aortocoronary bypass graft: Secondary | ICD-10-CM | POA: Insufficient documentation

## 2012-12-29 DIAGNOSIS — I10 Essential (primary) hypertension: Secondary | ICD-10-CM | POA: Insufficient documentation

## 2012-12-29 DIAGNOSIS — Z2911 Encounter for prophylactic immunotherapy for respiratory syncytial virus (RSV): Secondary | ICD-10-CM

## 2012-12-29 DIAGNOSIS — E785 Hyperlipidemia, unspecified: Secondary | ICD-10-CM | POA: Insufficient documentation

## 2012-12-29 DIAGNOSIS — I509 Heart failure, unspecified: Secondary | ICD-10-CM | POA: Insufficient documentation

## 2012-12-29 DIAGNOSIS — I2510 Atherosclerotic heart disease of native coronary artery without angina pectoris: Secondary | ICD-10-CM

## 2012-12-29 DIAGNOSIS — Z23 Encounter for immunization: Secondary | ICD-10-CM

## 2012-12-29 DIAGNOSIS — I5022 Chronic systolic (congestive) heart failure: Secondary | ICD-10-CM | POA: Insufficient documentation

## 2012-12-29 NOTE — Patient Instructions (Signed)
Will schedule an ECHO.  Call any issues.  Keep exercising. Have a wonderful holiday.  F/U 1 year  Do the following things EVERYDAY: 1) Weigh yourself in the morning before breakfast. Write it down and keep it in a log. 2) Take your medicines as prescribed 3) Eat low salt foods-Limit salt (sodium) to 2000 mg per day.  4) Stay as active as you can everyday 5) Limit all fluids for the day to less than 2 liters 6)

## 2012-12-29 NOTE — Progress Notes (Signed)
HPI: Joseph Malone  is a pleasant 79 year old male with a history of coronary artery disease status post large anterior MI with coronary  bypass grafting in 2001 with a LIMA to the LAD.  He has history of  congestive heart failure secondary to resultant ischemic cardiomyopathy,  ejection fraction however is in the 35-40% (08/2005).  He is status post  single-chamber ICD.  He did have a nuclear study in March 01, 2006, which showed an EF of 38% a large anterior infarction, but no ischemia. ETT 11/2009 was normal (8:59min on Bruce)  The rest of his medical history is notable for obesity, hypertension,  hyperlipidemia, and glucose intolerance.   Yearly Follow up: Feeling good. In last 2 years has noticed his SOB has gotten worse. +DOE with going up stairs. Reports being able to walk 30 min level ground with no SOB. CP reports is the same over the past 10 years. Denies palpitations, edema, dizziness or orthopnea. Weight stable 240 lbs. Taking medications as prescribed. No shocks from ICD. SBP at home 120s after he takes his medications.   Labs: (11/2012)L: K+ 4.1, Cr. 1.1, cholesterol 122, TG 137, HDL 43, LDL 51  ROS: All systems negative except as listed in HPI, PMH and Problem List.  Past Medical History  Diagnosis Date  . CAD (coronary artery disease)     s/p CABG  . Heart attack 2001  . Heart failure     CHF due to ischemic CM  . Ischemic cardiomyopathy   . Excess weight   . Hypertension   . Hyperlipidemia   . Fasting hyperglycemia   . BPH (benign prostatic hypertrophy)   . Microscopic hematuria     Dr. Margarita Grizzle  . Diverticulosis     Current Outpatient Prescriptions  Medication Sig Dispense Refill  . acetaminophen (TYLENOL) 500 MG tablet Take 1,000 mg by mouth daily. 2 by mouth at bedtime      . aspirin 325 MG tablet Take 325 mg by mouth every evening.       . carvedilol (COREG) 25 MG tablet Take 1 tablet (25 mg total) by mouth 2 (two) times daily.  180 tablet  3  . Cholecalciferol  (VITAMIN D3) 2000 UNITS TABS Take 2,000 Units by mouth every evening.       . Flaxseed, Linseed, (FLAX SEED OIL) 1000 MG CAPS Take 1,000 mg by mouth every evening.       . folic acid (FOLVITE) 400 MCG tablet Take 400 mcg by mouth daily.        . Glucosamine HCl 1500 MG TABS Take 1,500 mg by mouth every evening.       Marland Kitchen glucose blood test strip 1 each by Other route as needed. Freestyle Lite, test once daily       . Lancet Device MISC by Does not apply route. Freestlyle [employer], once daily       . losartan (COZAAR) 50 MG tablet Take 1 tablet (50 mg total) by mouth at bedtime.  90 tablet  3  . Multiple Vitamin (MULTIVITAMIN WITH MINERALS) TABS Take 1 tablet by mouth daily.       . sildenafil (VIAGRA) 100 MG tablet Take 100 mg by mouth daily as needed for erectile dysfunction.      . simvastatin (ZOCOR) 40 MG tablet Take 1 tablet (40 mg total) by mouth at bedtime.  90 tablet  3   No current facility-administered medications for this encounter.   Filed Vitals:   12/29/12 1347  BP: 124/76  Pulse: 73  Weight: 252 lb 8 oz (114.533 kg)  SpO2: 95%    PHYSICAL EXAM: General: Elderly appearing. No resp difficulty HEENT: normal; large knot on L side of forehead.  Neck: supple. JVP flat. Carotids 2+ bilaterally; no bruits. No lymphadenopathy or thryomegaly appreciated. Cor: PMI normal. Regular rate & rhythm. No rubs, gallops or murmurs. Lungs: clear Abdomen: soft, nontender, nondistended. No hepatosplenomegaly. No bruits or masses. Good bowel sounds. Extremities: no cyanosis, clubbing, rash, edema Neuro: alert & orientedx3, cranial nerves grossly intact. Moves all 4 extremities w/o difficulty. Affect pleasant.   ECG: NSR       ASSESSMENT & PLAN:  1) CAD: s/p CABG 2001 - No s/s of ischemia. Will continue medical management (BB, statin, and ASA) 2) Chronic systolic HF: ICM, EF 35-40% (08/2005) s/p ICD.  NYHA II-III symptoms. Volume status stable. He is not on any diuretics. - Will  continue coreg at goal dose 25 mg BID and losartan 50 mg daily.  - Reinforced the need and importance of daily weights, a low sodium diet, and fluid restriction (less than 2 L a day). Instructed to call the HF clinic if weight increases more than 3 lbs overnight or 5 lbs in a week.  - Has not had an ECHO since 2007, will order ECHO.  3) HTN - controlled on BB and ARB 4) HLD - managed by PCP with excellent lipids in 10/14.   F/U 1 year Joseph Potash B NP-C 4:57 PM  Patient seen with NP, agree with the above note.  Doing well overall, no ischemic symptoms.  Will get echo to reassess LV systolic function.   Joseph Malone 12/30/2012

## 2012-12-30 NOTE — Addendum Note (Signed)
Encounter addended by: Sanda Linger, CCT on: 12/30/2012 11:44 AM<BR>     Documentation filed: Charges VN

## 2013-01-05 ENCOUNTER — Other Ambulatory Visit (HOSPITAL_COMMUNITY): Payer: Self-pay | Admitting: *Deleted

## 2013-01-05 MED ORDER — LOSARTAN POTASSIUM 50 MG PO TABS: 50.0000 mg | ORAL_TABLET | Freq: Every day | ORAL | Status: DC

## 2013-01-11 ENCOUNTER — Ambulatory Visit (HOSPITAL_COMMUNITY)
Admission: RE | Admit: 2013-01-11 | Discharge: 2013-01-11 | Disposition: A | Discharge status: Home / Self Care | Payer: Medicare Other | Source: Ambulatory Visit | Attending: Internal Medicine | Admitting: Internal Medicine

## 2013-01-11 DIAGNOSIS — I5022 Chronic systolic (congestive) heart failure: Secondary | ICD-10-CM

## 2013-01-11 DIAGNOSIS — I2510 Atherosclerotic heart disease of native coronary artery without angina pectoris: Secondary | ICD-10-CM | POA: Insufficient documentation

## 2013-01-11 DIAGNOSIS — I517 Cardiomegaly: Secondary | ICD-10-CM | POA: Insufficient documentation

## 2013-01-11 DIAGNOSIS — I252 Old myocardial infarction: Secondary | ICD-10-CM | POA: Insufficient documentation

## 2013-01-11 DIAGNOSIS — R0989 Other specified symptoms and signs involving the circulatory and respiratory systems: Secondary | ICD-10-CM | POA: Insufficient documentation

## 2013-01-11 DIAGNOSIS — I428 Other cardiomyopathies: Secondary | ICD-10-CM | POA: Insufficient documentation

## 2013-01-11 DIAGNOSIS — I379 Nonrheumatic pulmonary valve disorder, unspecified: Secondary | ICD-10-CM

## 2013-01-11 DIAGNOSIS — E785 Hyperlipidemia, unspecified: Secondary | ICD-10-CM | POA: Insufficient documentation

## 2013-01-11 DIAGNOSIS — R0609 Other forms of dyspnea: Secondary | ICD-10-CM | POA: Insufficient documentation

## 2013-01-11 DIAGNOSIS — E119 Type 2 diabetes mellitus without complications: Secondary | ICD-10-CM | POA: Insufficient documentation

## 2013-01-11 DIAGNOSIS — I10 Essential (primary) hypertension: Secondary | ICD-10-CM | POA: Insufficient documentation

## 2013-01-11 NOTE — Progress Notes (Signed)
Echo Lab  2D Echocardiogram completed.  Jonessa Triplett L Rehema Muffley, RDCS 01/11/2013 2:16 PM

## 2013-01-17 ENCOUNTER — Encounter: Payer: Self-pay | Admitting: Internal Medicine

## 2013-01-27 ENCOUNTER — Encounter: Payer: Medicare Other | Admitting: Internal Medicine

## 2013-02-18 ENCOUNTER — Ambulatory Visit (INDEPENDENT_AMBULATORY_CARE_PROVIDER_SITE_OTHER): Payer: Medicare Other | Admitting: Internal Medicine

## 2013-02-18 ENCOUNTER — Encounter: Payer: Self-pay | Admitting: Internal Medicine

## 2013-02-18 ENCOUNTER — Other Ambulatory Visit (HOSPITAL_COMMUNITY): Payer: Self-pay | Admitting: Cardiology

## 2013-02-18 VITALS — BP 128/88 | HR 80 | Ht 71.5 in | Wt 252.0 lb

## 2013-02-18 DIAGNOSIS — Z95810 Presence of automatic (implantable) cardiac defibrillator: Secondary | ICD-10-CM

## 2013-02-18 DIAGNOSIS — I255 Ischemic cardiomyopathy: Secondary | ICD-10-CM

## 2013-02-18 DIAGNOSIS — I2589 Other forms of chronic ischemic heart disease: Secondary | ICD-10-CM

## 2013-02-18 DIAGNOSIS — I5022 Chronic systolic (congestive) heart failure: Secondary | ICD-10-CM

## 2013-02-18 LAB — MDC_IDC_ENUM_SESS_TYPE_INCLINIC
Battery Voltage: 2.64 V
Brady Statistic RV Percent Paced: 0 %
HIGH POWER IMPEDANCE MEASURED VALUE: 49 Ohm
HIGH POWER IMPEDANCE MEASURED VALUE: 49 Ohm
HIGH POWER IMPEDANCE MEASURED VALUE: 50 Ohm
HIGH POWER IMPEDANCE MEASURED VALUE: 50 Ohm
HIGH POWER IMPEDANCE MEASURED VALUE: 51 Ohm
HIGH POWER IMPEDANCE MEASURED VALUE: 51 Ohm
HIGH POWER IMPEDANCE MEASURED VALUE: 52 Ohm
HIGH POWER IMPEDANCE MEASURED VALUE: 62 Ohm
HIGH POWER IMPEDANCE MEASURED VALUE: 64 Ohm
HIGH POWER IMPEDANCE MEASURED VALUE: 64 Ohm
HIGH POWER IMPEDANCE MEASURED VALUE: 65 Ohm
HIGH POWER IMPEDANCE MEASURED VALUE: 66 Ohm
HIGH POWER IMPEDANCE MEASURED VALUE: 66 Ohm
HIGH POWER IMPEDANCE MEASURED VALUE: 66 Ohm
HighPow Impedance: 50 Ohm
HighPow Impedance: 50 Ohm
HighPow Impedance: 50 Ohm
HighPow Impedance: 50 Ohm
HighPow Impedance: 51 Ohm
HighPow Impedance: 51 Ohm
HighPow Impedance: 51 Ohm
HighPow Impedance: 51 Ohm
HighPow Impedance: 52 Ohm
HighPow Impedance: 61 Ohm
HighPow Impedance: 61 Ohm
HighPow Impedance: 62 Ohm
HighPow Impedance: 62 Ohm
HighPow Impedance: 62 Ohm
HighPow Impedance: 63 Ohm
HighPow Impedance: 63 Ohm
HighPow Impedance: 66 Ohm
Lead Channel Impedance Value: 568 Ohm
Lead Channel Impedance Value: 624 Ohm
Lead Channel Impedance Value: 640 Ohm
Lead Channel Impedance Value: 680 Ohm
Lead Channel Impedance Value: 688 Ohm
Lead Channel Impedance Value: 688 Ohm
Lead Channel Impedance Value: 696 Ohm
Lead Channel Sensing Intrinsic Amplitude: 5.8 mV
Lead Channel Sensing Intrinsic Amplitude: 5.9 mV
Lead Channel Sensing Intrinsic Amplitude: 5.9 mV
Lead Channel Sensing Intrinsic Amplitude: 6.3 mV
Lead Channel Sensing Intrinsic Amplitude: 6.4 mV
Lead Channel Sensing Intrinsic Amplitude: 6.4 mV
Lead Channel Sensing Intrinsic Amplitude: 6.6 mV
Lead Channel Sensing Intrinsic Amplitude: 6.7 mV
Lead Channel Setting Pacing Amplitude: 2.5 V
Lead Channel Setting Pacing Pulse Width: 0.4 ms
MDC IDC MSMT LEADCHNL RV IMPEDANCE VALUE: 552 Ohm
MDC IDC MSMT LEADCHNL RV IMPEDANCE VALUE: 560 Ohm
MDC IDC MSMT LEADCHNL RV IMPEDANCE VALUE: 560 Ohm
MDC IDC MSMT LEADCHNL RV IMPEDANCE VALUE: 576 Ohm
MDC IDC MSMT LEADCHNL RV IMPEDANCE VALUE: 592 Ohm
MDC IDC MSMT LEADCHNL RV IMPEDANCE VALUE: 656 Ohm
MDC IDC MSMT LEADCHNL RV IMPEDANCE VALUE: 664 Ohm
MDC IDC MSMT LEADCHNL RV IMPEDANCE VALUE: 672 Ohm
MDC IDC MSMT LEADCHNL RV PACING THRESHOLD AMPLITUDE: 1 V
MDC IDC MSMT LEADCHNL RV PACING THRESHOLD PULSEWIDTH: 0.3 ms
MDC IDC MSMT LEADCHNL RV SENSING INTR AMPL: 5.5 mV
MDC IDC MSMT LEADCHNL RV SENSING INTR AMPL: 6.2 mV
MDC IDC MSMT LEADCHNL RV SENSING INTR AMPL: 6.3 mV
MDC IDC MSMT LEADCHNL RV SENSING INTR AMPL: 6.4 mV
MDC IDC MSMT LEADCHNL RV SENSING INTR AMPL: 6.4 mV
MDC IDC MSMT LEADCHNL RV SENSING INTR AMPL: 6.9 mV
MDC IDC MSMT LEADCHNL RV SENSING INTR AMPL: 7.7 mV
MDC IDC SESS DTM: 20150109170403
MDC IDC SET LEADCHNL RV SENSING SENSITIVITY: 0.3 mV
MDC IDC SET ZONE DETECTION INTERVAL: 240 ms
MDC IDC SET ZONE DETECTION INTERVAL: 360 ms
Zone Setting Detection Interval: 340 ms

## 2013-02-18 MED ORDER — CARVEDILOL 25 MG PO TABS: 25.0000 mg | ORAL_TABLET | Freq: Two times a day (BID) | ORAL | Status: DC

## 2013-02-18 MED ORDER — SILDENAFIL CITRATE 50 MG PO TABS: 100.0000 mg | ORAL_TABLET | Freq: Every day | ORAL | Status: DC | PRN

## 2013-02-18 NOTE — Assessment & Plan Note (Signed)
He will continue his current medical therapy. His symptoms are class II. He is encouraged to maintain a low-sodium diet.

## 2013-02-18 NOTE — Assessment & Plan Note (Signed)
He denies anginal symptoms. I've encouraged the patient to maintain his physical activity.

## 2013-02-18 NOTE — Assessment & Plan Note (Signed)
His Medtronic ICD is working normally. He is approaching elective replacement. He is considering not having a new device placed and he will reflect on this.

## 2013-02-18 NOTE — Patient Instructions (Signed)
Remote monitoring is used to monitor your Pacemaker of ICD from home. This monitoring reduces the number of office visits required to check your device to one time per year. It allows [place] to keep an eye on the functioning of your device to ensure it is working properly. You are scheduled for a device check from home on 04/18/13. You may send your transmission at any time that day. If you have a wireless device, the transmission will be sent automatically. After your physician reviews your transmission, you will receive a postcard with your next transmission date.

## 2013-02-18 NOTE — Progress Notes (Signed)
HPI [name] returns today for followup. He is a very pleasant 80 year old man with an ischemic cardiomyopathy, chronic systolic heart failure, status post ICD implantation. In the interim, he has been stable. He denies chest pain. He has chronic class II heart failure symptoms. He denies peripheral edema. No syncope. No ICD shock. Allergies  Allergen Reactions  . Penicillins     Rash Because of a history of documented adverse serious drug reaction;Medi Alert bracelet  is recommended     Current Outpatient Prescriptions  Medication Sig Dispense Refill  . acetaminophen (TYLENOL) 500 MG tablet Take 1,000 mg by mouth daily. 2 by mouth at bedtime      . aspirin 325 MG tablet Take 325 mg by mouth every evening.       . carvedilol (COREG) 25 MG tablet Take 1 tablet (25 mg total) by mouth 2 (two) times daily.  180 tablet  3  . Cholecalciferol (VITAMIN D3) 2000 UNITS TABS Take 2,000 Units by mouth every evening.       . Flaxseed, Linseed, (FLAX SEED OIL) 1000 MG CAPS Take 1,000 mg by mouth every evening.       . folic acid (FOLVITE) 716 MCG tablet Take 400 mcg by mouth daily.        . Glucosamine HCl 1500 MG TABS Take 1,500 mg by mouth every evening.       Marland Kitchen losartan (COZAAR) 50 MG tablet Take 1 tablet (50 mg total) by mouth at bedtime.  90 tablet  3  . Multiple Vitamin (MULTIVITAMIN WITH MINERALS) TABS Take 1 tablet by mouth daily.       . sildenafil (VIAGRA) 100 MG tablet Take 100 mg by mouth daily as needed for erectile dysfunction.      . simvastatin (ZOCOR) 40 MG tablet Take 1 tablet (40 mg total) by mouth at bedtime.  90 tablet  3   No current facility-administered medications for this visit.     Past Medical History  Diagnosis Date  . CAD (coronary artery disease)     s/p CABG  . Heart attack 2001  . Heart failure     CHF due to ischemic CM  . Ischemic cardiomyopathy   . Excess weight   . Hypertension   . Hyperlipidemia   . Fasting hyperglycemia   . BPH (benign prostatic  hypertrophy)   . Microscopic hematuria     Dr. [name]  . Diverticulosis     ROS:   All systems reviewed and negative except as noted in the HPI.   Past Surgical History  Procedure Laterality Date  . Coronary artery bypass graft  2000    1 vessel  . Appendectomy    . Pilonidal cyst excision    . Cataract extraction  2008    bilateral with lens implant  . Colonoscopy  2004    Tics; [facility]  . Transthoracic echocardiogram  08/29/2005  . Cardiac defibrillator placement  2005    Medtronic  . Cardiac catheterization  2000  . Cystoscopy  06/2012    Dr [name]     Family History  Problem Relation Age of Onset  . Hypertension Father   . Heart attack Father 69  . Diabetes Brother     X 2  . Heart attack Brother 18  . Heart attack Paternal Uncle 36  . Sudden death Paternal Grandfather 76    ? heat stroke  . Lung cancer Sister     "Asian lung cancer"  . Stroke Neg  Hx      History   Social History  . Marital Status: Married    Spouse Name: N/A    Number of Children: N/A  . Years of Education: N/A   Occupational History  . Not on file.   Social History Main Topics  . Smoking status: Passive Smoke Exposure - Never Smoker  . Smokeless tobacco: Not on file  . Alcohol Use: No  . Drug Use: No  . Sexual Activity: Not on file   Other Topics Concern  . Not on file   Social History Narrative  . No narrative on file     BP 128/88  Pulse 80  Ht 5' 11.5" (1.816 m)  Wt 252 lb (114.306 kg)  BMI 34.66 kg/m2  Physical Exam:  Well appearing 80 year old man, NAD HEENT: Unremarkable Neck:  7 cm JVD, no thyromegally Lungs:  Clear with no wheezes, rales, or rhonchi. HEART:  Regular rate rhythm, no murmurs, no rubs, no clicks Abd:  soft, obese, positive bowel sounds, no organomegally, no rebound, no guarding Ext:  2 plus pulses, no edema, no cyanosis, no clubbing Skin:  No rashes no nodules Neuro:  CN II through XII intact, motor grossly intact  DEVICE   Normal device function.  See PaceArt for details. Approaching elective replacement  Assess/Plan:

## 2013-02-24 ENCOUNTER — Encounter: Payer: Self-pay | Admitting: Internal Medicine

## 2013-03-24 ENCOUNTER — Other Ambulatory Visit (HOSPITAL_COMMUNITY): Payer: Self-pay | Admitting: Cardiology

## 2013-03-24 DIAGNOSIS — E785 Hyperlipidemia, unspecified: Secondary | ICD-10-CM

## 2013-03-24 MED ORDER — SIMVASTATIN 40 MG PO TABS: 40.0000 mg | ORAL_TABLET | Freq: Every day | ORAL | Status: DC

## 2013-04-18 ENCOUNTER — Ambulatory Visit (INDEPENDENT_AMBULATORY_CARE_PROVIDER_SITE_OTHER): Payer: Medicare Other | Admitting: *Deleted

## 2013-04-18 DIAGNOSIS — Z4502 Encounter for adjustment and management of automatic implantable cardiac defibrillator: Secondary | ICD-10-CM

## 2013-04-23 LAB — MDC_IDC_ENUM_SESS_TYPE_REMOTE
Battery Voltage: 2.64 V
Date Time Interrogation Session: 20150309173500
HIGH POWER IMPEDANCE MEASURED VALUE: 49 Ohm
Lead Channel Impedance Value: 640 Ohm
Lead Channel Setting Pacing Pulse Width: 0.4 ms
MDC IDC MSMT LEADCHNL RV SENSING INTR AMPL: 6.4 mV
MDC IDC SET LEADCHNL RV PACING AMPLITUDE: 2.5 V
MDC IDC SET LEADCHNL RV SENSING SENSITIVITY: 0.3 mV
Zone Setting Detection Interval: 240 ms
Zone Setting Detection Interval: 340 ms
Zone Setting Detection Interval: 360 ms

## 2013-04-28 ENCOUNTER — Encounter: Payer: Self-pay | Admitting: *Deleted

## 2013-05-02 ENCOUNTER — Encounter: Payer: Self-pay | Admitting: *Deleted

## 2013-05-02 NOTE — Addendum Note (Signed)
Addended by: Kendell Bane on: 05/02/2013 10:14 AM   Modules accepted: Level of Service

## 2013-05-10 ENCOUNTER — Encounter: Payer: Self-pay | Admitting: Internal Medicine

## 2013-05-18 ENCOUNTER — Ambulatory Visit (INDEPENDENT_AMBULATORY_CARE_PROVIDER_SITE_OTHER): Payer: Medicare Other | Admitting: Internal Medicine

## 2013-05-18 ENCOUNTER — Encounter: Payer: Self-pay | Admitting: *Deleted

## 2013-05-18 ENCOUNTER — Encounter: Payer: Self-pay | Admitting: Internal Medicine

## 2013-05-18 VITALS — BP 123/75 | HR 79 | Ht 71.0 in | Wt 250.2 lb

## 2013-05-18 DIAGNOSIS — Z95810 Presence of automatic (implantable) cardiac defibrillator: Secondary | ICD-10-CM

## 2013-05-18 DIAGNOSIS — I5022 Chronic systolic (congestive) heart failure: Secondary | ICD-10-CM

## 2013-05-18 DIAGNOSIS — I2589 Other forms of chronic ischemic heart disease: Secondary | ICD-10-CM

## 2013-05-18 DIAGNOSIS — Z4502 Encounter for adjustment and management of automatic implantable cardiac defibrillator: Secondary | ICD-10-CM

## 2013-05-18 LAB — CBC WITH DIFFERENTIAL/PLATELET
BASOS ABS: 0.1 10*3/uL (ref 0.0–0.1)
Basophils Relative: 0.8 % (ref 0.0–3.0)
Eosinophils Absolute: 0.2 10*3/uL (ref 0.0–0.7)
Eosinophils Relative: 2.5 % (ref 0.0–5.0)
HEMATOCRIT: 41.4 % (ref 39.0–52.0)
Hemoglobin: 14.1 g/dL (ref 13.0–17.0)
LYMPHS ABS: 2.4 10*3/uL (ref 0.7–4.0)
Lymphocytes Relative: 36 % (ref 12.0–46.0)
MCHC: 34.2 g/dL (ref 30.0–36.0)
MCV: 93.1 fl (ref 78.0–100.0)
MONO ABS: 0.5 10*3/uL (ref 0.1–1.0)
MONOS PCT: 7.7 % (ref 3.0–12.0)
Neutro Abs: 3.5 10*3/uL (ref 1.4–7.7)
Neutrophils Relative %: 53 % (ref 43.0–77.0)
Platelets: 166 10*3/uL (ref 150.0–400.0)
RBC: 4.45 Mil/uL (ref 4.22–5.81)
RDW: 13.1 % (ref 11.5–14.6)
WBC: 6.6 10*3/uL (ref 4.5–10.5)

## 2013-05-18 LAB — MDC_IDC_ENUM_SESS_TYPE_INCLINIC
Brady Statistic RV Percent Paced: 0 %
HIGH POWER IMPEDANCE MEASURED VALUE: 48 Ohm
HIGH POWER IMPEDANCE MEASURED VALUE: 48 Ohm
HIGH POWER IMPEDANCE MEASURED VALUE: 48 Ohm
HIGH POWER IMPEDANCE MEASURED VALUE: 50 Ohm
HIGH POWER IMPEDANCE MEASURED VALUE: 51 Ohm
HIGH POWER IMPEDANCE MEASURED VALUE: 59 Ohm
HIGH POWER IMPEDANCE MEASURED VALUE: 60 Ohm
HIGH POWER IMPEDANCE MEASURED VALUE: 62 Ohm
HIGH POWER IMPEDANCE MEASURED VALUE: 62 Ohm
HighPow Impedance: 47 Ohm
HighPow Impedance: 48 Ohm
HighPow Impedance: 48 Ohm
HighPow Impedance: 48 Ohm
HighPow Impedance: 49 Ohm
HighPow Impedance: 49 Ohm
HighPow Impedance: 49 Ohm
HighPow Impedance: 49 Ohm
HighPow Impedance: 50 Ohm
HighPow Impedance: 50 Ohm
HighPow Impedance: 50 Ohm
HighPow Impedance: 59 Ohm
HighPow Impedance: 59 Ohm
HighPow Impedance: 60 Ohm
HighPow Impedance: 61 Ohm
HighPow Impedance: 61 Ohm
HighPow Impedance: 61 Ohm
HighPow Impedance: 62 Ohm
HighPow Impedance: 63 Ohm
HighPow Impedance: 63 Ohm
HighPow Impedance: 63 Ohm
HighPow Impedance: 64 Ohm
Lead Channel Impedance Value: 544 Ohm
Lead Channel Impedance Value: 576 Ohm
Lead Channel Impedance Value: 576 Ohm
Lead Channel Impedance Value: 584 Ohm
Lead Channel Impedance Value: 640 Ohm
Lead Channel Impedance Value: 640 Ohm
Lead Channel Impedance Value: 640 Ohm
Lead Channel Impedance Value: 648 Ohm
Lead Channel Impedance Value: 656 Ohm
Lead Channel Impedance Value: 672 Ohm
Lead Channel Impedance Value: 680 Ohm
Lead Channel Sensing Intrinsic Amplitude: 5.8 mV
Lead Channel Sensing Intrinsic Amplitude: 5.9 mV
Lead Channel Sensing Intrinsic Amplitude: 6 mV
Lead Channel Sensing Intrinsic Amplitude: 6 mV
Lead Channel Sensing Intrinsic Amplitude: 6.1 mV
Lead Channel Sensing Intrinsic Amplitude: 6.1 mV
Lead Channel Sensing Intrinsic Amplitude: 6.1 mV
Lead Channel Sensing Intrinsic Amplitude: 6.2 mV
Lead Channel Sensing Intrinsic Amplitude: 6.3 mV
Lead Channel Sensing Intrinsic Amplitude: 6.4 mV
Lead Channel Sensing Intrinsic Amplitude: 6.5 mV
Lead Channel Setting Pacing Amplitude: 2.5 V
Lead Channel Setting Pacing Pulse Width: 0.4 ms
Lead Channel Setting Sensing Sensitivity: 0.3 mV
MDC IDC MSMT BATTERY VOLTAGE: 2.62 V
MDC IDC MSMT LEADCHNL RV IMPEDANCE VALUE: 576 Ohm
MDC IDC MSMT LEADCHNL RV IMPEDANCE VALUE: 584 Ohm
MDC IDC MSMT LEADCHNL RV IMPEDANCE VALUE: 664 Ohm
MDC IDC MSMT LEADCHNL RV IMPEDANCE VALUE: 664 Ohm
MDC IDC MSMT LEADCHNL RV SENSING INTR AMPL: 5.9 mV
MDC IDC MSMT LEADCHNL RV SENSING INTR AMPL: 6.1 mV
MDC IDC MSMT LEADCHNL RV SENSING INTR AMPL: 6.2 mV
MDC IDC MSMT LEADCHNL RV SENSING INTR AMPL: 6.3 mV
MDC IDC SESS DTM: 20150408180932
MDC IDC SET ZONE DETECTION INTERVAL: 240 ms
MDC IDC SET ZONE DETECTION INTERVAL: 340 ms
MDC IDC SET ZONE DETECTION INTERVAL: 360 ms

## 2013-05-18 LAB — BASIC METABOLIC PANEL
BUN: 21 mg/dL (ref 6–23)
CHLORIDE: 107 meq/L (ref 96–112)
CO2: 28 meq/L (ref 19–32)
Calcium: 9.1 mg/dL (ref 8.4–10.5)
Creatinine, Ser: 1.3 mg/dL (ref 0.4–1.5)
GFR: 54.81 mL/min — ABNORMAL LOW (ref >60.00)
Glucose, Bld: 138 mg/dL — ABNORMAL HIGH (ref 70–99)
Potassium: 3.9 mEq/L (ref 3.5–5.1)
Sodium: 139 mEq/L (ref 135–145)

## 2013-05-18 NOTE — Patient Instructions (Signed)
Generator change on 06/01/13  See instruction sheet

## 2013-05-18 NOTE — Assessment & Plan Note (Signed)
He has class 2B symptoms. He will continue his medical therapy and I have asked him to increased his physical activity.

## 2013-05-18 NOTE — Progress Notes (Signed)
HPI Joseph Malone returns today for followup. He is a very pleasant 80 year old man with an ischemic cardiomyopathy, chronic systolic heart failure, status post ICD implantation. In the interim, he has been stable. He denies chest pain. He has chronic class II heart failure symptoms. He denies peripheral edema. No syncope. No ICD shock. He has continued to be bothered by heart failure but is not exercising. I have asked the patient to start walking daily. Allergies  Allergen Reactions  . Penicillins     Rash Because of a history of documented adverse serious drug reaction;Medi Alert bracelet  is recommended     Current Outpatient Prescriptions  Medication Sig Dispense Refill  . acetaminophen (TYLENOL) 500 MG tablet Take 1,000 mg by mouth daily. 2 by mouth at bedtime      . aspirin 325 MG tablet Take 325 mg by mouth every evening.       . carvedilol (COREG) 25 MG tablet Take 1 tablet (25 mg total) by mouth 2 (two) times daily.  180 tablet  3  . Cholecalciferol (VITAMIN D3) 2000 UNITS TABS Take 2,000 Units by mouth every evening.       . Flaxseed, Linseed, (FLAX SEED OIL) 1000 MG CAPS Take 1,000 mg by mouth every evening.       . folic acid (FOLVITE) 485 MCG tablet Take 400 mcg by mouth daily.        . Glucosamine HCl 1500 MG TABS Take 1,500 mg by mouth every evening.       Marland Kitchen losartan (COZAAR) 50 MG tablet Take 1 tablet (50 mg total) by mouth at bedtime.  90 tablet  3  . Multiple Vitamin (MULTIVITAMIN WITH MINERALS) TABS Take 1 tablet by mouth daily.       . sildenafil (VIAGRA) 100 MG tablet Take 100 mg by mouth as needed for erectile dysfunction.      . simvastatin (ZOCOR) 40 MG tablet Take 1 tablet (40 mg total) by mouth at bedtime.  90 tablet  3   No current facility-administered medications for this visit.     Past Medical History  Diagnosis Date  . CAD (coronary artery disease)     s/p CABG  . Heart attack 2001  . Heart failure     CHF due to ischemic CM  . Ischemic cardiomyopathy    . Excess weight   . Hypertension   . Hyperlipidemia   . Fasting hyperglycemia   . BPH (benign prostatic hypertrophy)   . Microscopic hematuria     Dr. [name]  . Diverticulosis     ROS:   All systems reviewed and negative except as noted in the HPI.   Past Surgical History  Procedure Laterality Date  . Coronary artery bypass graft  2000    1 vessel  . Appendectomy    . Pilonidal cyst excision    . Cataract extraction  2008    bilateral with lens implant  . Colonoscopy  2004    Tics; [facility]  . Transthoracic echocardiogram  08/29/2005  . Cardiac defibrillator placement  2005    Medtronic  . Cardiac catheterization  2000  . Cystoscopy  06/2012    Dr [name]     Family History  Problem Relation Age of Onset  . Hypertension Father   . Heart attack Father 31  . Diabetes Brother     X 2  . Heart attack Brother 62  . Heart attack Paternal Uncle 81  . Sudden death Paternal Grandfather 63    ?  heat stroke  . Lung cancer Sister     "Asian lung cancer"  . Stroke Neg Hx      History   Social History  . Marital Status: Married    Spouse Name: N/A    Number of Children: N/A  . Years of Education: N/A   Occupational History  . Not on file.   Social History Main Topics  . Smoking status: Passive Smoke Exposure - Never Smoker  . Smokeless tobacco: Not on file  . Alcohol Use: No  . Drug Use: No  . Sexual Activity: Not on file   Other Topics Concern  . Not on file   Social History Narrative  . No narrative on file     BP 123/75  Pulse 79  Ht 5\' 11"  (1.803 m)  Wt 250 lb 3.2 oz (113.49 kg)  BMI 34.91 kg/m2  Physical Exam:  Well appearing 80 year old man, NAD HEENT: Unremarkable Neck:  7 cm JVD, no thyromegally Lungs:  Clear with no wheezes, rales, or rhonchi. HEART:  Regular rate rhythm, no murmurs, no rubs, no clicks Abd:  soft, obese, positive bowel sounds, no organomegally, no rebound, no guarding Ext:  2 plus pulses, no edema, no cyanosis,  no clubbing Skin:  No rashes no nodules Neuro:  CN II through XII intact, motor grossly intact  DEVICE  Normal device function.  See PaceArt for details. He is at [facility].  Assess/Plan:

## 2013-05-18 NOTE — Assessment & Plan Note (Addendum)
He has reached ERI. He will undergo ICD generator change. Will check echo. Hiis EF is 30% by echo, and unchanged.

## 2013-05-19 ENCOUNTER — Encounter (HOSPITAL_COMMUNITY): Payer: Self-pay | Admitting: Pharmacy Technician

## 2013-05-19 ENCOUNTER — Encounter: Payer: Medicare Other | Admitting: *Deleted

## 2013-05-31 MED ORDER — VANCOMYCIN HCL 10 G IV SOLR
1500.0000 mg | INTRAVENOUS | Status: DC
  Filled 2013-05-31: qty 1500

## 2013-05-31 MED ORDER — CHLORHEXIDINE GLUCONATE 4 % EX LIQD
60.0000 mL | Freq: Once | CUTANEOUS | Status: DC
  Filled 2013-05-31: qty 60

## 2013-05-31 MED ORDER — SODIUM CHLORIDE 0.9 % IR SOLN
80.0000 mg | Status: DC
  Filled 2013-05-31: qty 2

## 2013-05-31 MED ORDER — SODIUM CHLORIDE 0.9 % IV SOLN
INTRAVENOUS | Status: DC
Start: 2013-05-31 — End: 2013-06-01
  Administered 2013-06-01: 50 mL/h via INTRAVENOUS

## 2013-06-01 ENCOUNTER — Ambulatory Visit (HOSPITAL_COMMUNITY)
Admission: RE | Admit: 2013-06-01 | Discharge: 2013-06-02 | Disposition: A | Discharge status: Home / Self Care | Payer: Medicare Other | Source: Ambulatory Visit | Attending: Internal Medicine | Admitting: Internal Medicine

## 2013-06-01 ENCOUNTER — Encounter (HOSPITAL_COMMUNITY)
Admission: RE | Disposition: A | Discharge status: Home / Self Care | Payer: Medicare Other | Source: Ambulatory Visit | Attending: Internal Medicine

## 2013-06-01 ENCOUNTER — Encounter (HOSPITAL_COMMUNITY): Payer: Self-pay

## 2013-06-01 DIAGNOSIS — Z87448 Personal history of other diseases of urinary system: Secondary | ICD-10-CM

## 2013-06-01 DIAGNOSIS — Z95810 Presence of automatic (implantable) cardiac defibrillator: Secondary | ICD-10-CM

## 2013-06-01 DIAGNOSIS — R7309 Other abnormal glucose: Secondary | ICD-10-CM

## 2013-06-01 DIAGNOSIS — I5022 Chronic systolic (congestive) heart failure: Secondary | ICD-10-CM | POA: Diagnosis present

## 2013-06-01 DIAGNOSIS — M159 Polyosteoarthritis, unspecified: Secondary | ICD-10-CM

## 2013-06-01 DIAGNOSIS — I509 Heart failure, unspecified: Secondary | ICD-10-CM | POA: Insufficient documentation

## 2013-06-01 DIAGNOSIS — Z4502 Encounter for adjustment and management of automatic implantable cardiac defibrillator: Secondary | ICD-10-CM

## 2013-06-01 DIAGNOSIS — E785 Hyperlipidemia, unspecified: Secondary | ICD-10-CM

## 2013-06-01 DIAGNOSIS — I2589 Other forms of chronic ischemic heart disease: Secondary | ICD-10-CM | POA: Insufficient documentation

## 2013-06-01 DIAGNOSIS — Z951 Presence of aortocoronary bypass graft: Secondary | ICD-10-CM | POA: Insufficient documentation

## 2013-06-01 DIAGNOSIS — R911 Solitary pulmonary nodule: Secondary | ICD-10-CM

## 2013-06-01 DIAGNOSIS — I2510 Atherosclerotic heart disease of native coronary artery without angina pectoris: Secondary | ICD-10-CM | POA: Insufficient documentation

## 2013-06-01 DIAGNOSIS — K5730 Diverticulosis of large intestine without perforation or abscess without bleeding: Secondary | ICD-10-CM | POA: Insufficient documentation

## 2013-06-01 DIAGNOSIS — Z7982 Long term (current) use of aspirin: Secondary | ICD-10-CM | POA: Insufficient documentation

## 2013-06-01 DIAGNOSIS — I10 Essential (primary) hypertension: Secondary | ICD-10-CM

## 2013-06-01 DIAGNOSIS — N400 Enlarged prostate without lower urinary tract symptoms: Secondary | ICD-10-CM

## 2013-06-01 HISTORY — PX: LEAD REVISION: SHX5945

## 2013-06-01 HISTORY — PX: IMPLANTABLE CARDIOVERTER DEFIBRILLATOR (ICD) GENERATOR CHANGE: SHX5469

## 2013-06-01 LAB — SURGICAL PCR SCREEN
MRSA, PCR: NEGATIVE
Staphylococcus aureus: NEGATIVE

## 2013-06-01 SURGERY — ICD GENERATOR CHANGE
Anesthesia: LOCAL

## 2013-06-01 MED ORDER — ONDANSETRON HCL 4 MG/2ML IJ SOLN: 4.0000 mg | Freq: Four times a day (QID) | INTRAMUSCULAR | Status: DC | PRN

## 2013-06-01 MED ORDER — VANCOMYCIN HCL IN DEXTROSE 1-5 GM/200ML-% IV SOLN
1000.0000 mg | Freq: Two times a day (BID) | INTRAVENOUS | Status: AC
  Administered 2013-06-01: 1000 mg via INTRAVENOUS
  Filled 2013-06-01: qty 200

## 2013-06-01 MED ORDER — LOSARTAN POTASSIUM 50 MG PO TABS
50.0000 mg | ORAL_TABLET | Freq: Every day | ORAL | Status: DC
  Administered 2013-06-01: 50 mg via ORAL
  Filled 2013-06-01 (×2): qty 1

## 2013-06-01 MED ORDER — MIDAZOLAM HCL 5 MG/5ML IJ SOLN
INTRAMUSCULAR | Status: AC
  Filled 2013-06-01: qty 5

## 2013-06-01 MED ORDER — LIDOCAINE HCL (PF) 1 % IJ SOLN
INTRAMUSCULAR | Status: AC
  Filled 2013-06-01: qty 60

## 2013-06-01 MED ORDER — MUPIROCIN 2 % EX OINT
TOPICAL_OINTMENT | CUTANEOUS | Status: AC
  Filled 2013-06-01: qty 22

## 2013-06-01 MED ORDER — ACETAMINOPHEN 500 MG PO TABS
1000.0000 mg | ORAL_TABLET | Freq: Every day | ORAL | Status: DC
  Administered 2013-06-01: 1000 mg via ORAL
  Filled 2013-06-01 (×2): qty 2

## 2013-06-01 MED ORDER — CARVEDILOL 25 MG PO TABS
25.0000 mg | ORAL_TABLET | Freq: Two times a day (BID) | ORAL | Status: DC
  Administered 2013-06-01 – 2013-06-02 (×2): 25 mg via ORAL
  Filled 2013-06-01 (×4): qty 1

## 2013-06-01 MED ORDER — ADULT MULTIVITAMIN W/MINERALS CH
1.0000 | ORAL_TABLET | Freq: Every day | ORAL | Status: DC
  Administered 2013-06-02: 1 via ORAL
  Filled 2013-06-01 (×2): qty 1

## 2013-06-01 MED ORDER — SIMVASTATIN 40 MG PO TABS
40.0000 mg | ORAL_TABLET | Freq: Every day | ORAL | Status: DC
  Administered 2013-06-01: 40 mg via ORAL
  Filled 2013-06-01 (×2): qty 1

## 2013-06-01 MED ORDER — ASPIRIN 325 MG PO TABS
325.0000 mg | ORAL_TABLET | Freq: Every evening | ORAL | Status: DC
  Administered 2013-06-01: 325 mg via ORAL
  Filled 2013-06-01 (×2): qty 1

## 2013-06-01 MED ORDER — FENTANYL CITRATE 0.05 MG/ML IJ SOLN
INTRAMUSCULAR | Status: AC
  Filled 2013-06-01: qty 2

## 2013-06-01 MED ORDER — ACETAMINOPHEN 325 MG PO TABS: 325.0000 mg | ORAL_TABLET | ORAL | Status: DC | PRN

## 2013-06-01 MED ORDER — MUPIROCIN 2 % EX OINT
TOPICAL_OINTMENT | Freq: Two times a day (BID) | CUTANEOUS | Status: DC
  Administered 2013-06-01: 1 via NASAL
  Filled 2013-06-01: qty 22

## 2013-06-01 NOTE — H&P (View-Only) (Signed)
HPI Joseph Malone returns today for followup. He is a very pleasant 80 year old man with an ischemic cardiomyopathy, chronic systolic heart failure, status post ICD implantation. In the interim, he has been stable. He denies chest pain. He has chronic class II heart failure symptoms. He denies peripheral edema. No syncope. No ICD shock. He has continued to be bothered by heart failure but is not exercising. I have asked the patient to start walking daily. Allergies  Allergen Reactions  . Penicillins     Rash Because of a history of documented adverse serious drug reaction;Medi Alert bracelet  is recommended     Current Outpatient Prescriptions  Medication Sig Dispense Refill  . acetaminophen (TYLENOL) 500 MG tablet Take 1,000 mg by mouth daily. 2 by mouth at bedtime      . aspirin 325 MG tablet Take 325 mg by mouth every evening.       . carvedilol (COREG) 25 MG tablet Take 1 tablet (25 mg total) by mouth 2 (two) times daily.  180 tablet  3  . Cholecalciferol (VITAMIN D3) 2000 UNITS TABS Take 2,000 Units by mouth every evening.       . Flaxseed, Linseed, (FLAX SEED OIL) 1000 MG CAPS Take 1,000 mg by mouth every evening.       . folic acid (FOLVITE) 485 MCG tablet Take 400 mcg by mouth daily.        . Glucosamine HCl 1500 MG TABS Take 1,500 mg by mouth every evening.       Marland Kitchen losartan (COZAAR) 50 MG tablet Take 1 tablet (50 mg total) by mouth at bedtime.  90 tablet  3  . Multiple Vitamin (MULTIVITAMIN WITH MINERALS) TABS Take 1 tablet by mouth daily.       . sildenafil (VIAGRA) 100 MG tablet Take 100 mg by mouth as needed for erectile dysfunction.      . simvastatin (ZOCOR) 40 MG tablet Take 1 tablet (40 mg total) by mouth at bedtime.  90 tablet  3   No current facility-administered medications for this visit.     Past Medical History  Diagnosis Date  . CAD (coronary artery disease)     s/p CABG  . Heart attack 2001  . Heart failure     CHF due to ischemic CM  . Ischemic cardiomyopathy    . Excess weight   . Hypertension   . Hyperlipidemia   . Fasting hyperglycemia   . BPH (benign prostatic hypertrophy)   . Microscopic hematuria     Dr. [name]  . Diverticulosis     ROS:   All systems reviewed and negative except as noted in the HPI.   Past Surgical History  Procedure Laterality Date  . Coronary artery bypass graft  2000    1 vessel  . Appendectomy    . Pilonidal cyst excision    . Cataract extraction  2008    bilateral with lens implant  . Colonoscopy  2004    Tics; [facility]  . Transthoracic echocardiogram  08/29/2005  . Cardiac defibrillator placement  2005    Medtronic  . Cardiac catheterization  2000  . Cystoscopy  06/2012    Dr [name]     Family History  Problem Relation Age of Onset  . Hypertension Father   . Heart attack Father 31  . Diabetes Brother     X 2  . Heart attack Brother 62  . Heart attack Paternal Uncle 81  . Sudden death Paternal Grandfather 63    ?  heat stroke  . Lung cancer Sister     "Asian lung cancer"  . Stroke Neg Hx      History   Social History  . Marital Status: Married    Spouse Name: N/A    Number of Children: N/A  . Years of Education: N/A   Occupational History  . Not on file.   Social History Main Topics  . Smoking status: Passive Smoke Exposure - Never Smoker  . Smokeless tobacco: Not on file  . Alcohol Use: No  . Drug Use: No  . Sexual Activity: Not on file   Other Topics Concern  . Not on file   Social History Narrative  . No narrative on file     BP 123/75  Pulse 79  Ht 5\' 11"  (1.803 m)  Wt 250 lb 3.2 oz (113.49 kg)  BMI 34.91 kg/m2  Physical Exam:  Well appearing 80 year old man, NAD HEENT: Unremarkable Neck:  7 cm JVD, no thyromegally Lungs:  Clear with no wheezes, rales, or rhonchi. HEART:  Regular rate rhythm, no murmurs, no rubs, no clicks Abd:  soft, obese, positive bowel sounds, no organomegally, no rebound, no guarding Ext:  2 plus pulses, no edema, no cyanosis,  no clubbing Skin:  No rashes no nodules Neuro:  CN II through XII intact, motor grossly intact  DEVICE  Normal device function.  See PaceArt for details. He is at [facility].  Assess/Plan:

## 2013-06-01 NOTE — Interval H&P Note (Signed)
History and Physical Interval Note: Since prior clinic visit, no change in the history, physical exam assessment and plan. The patient has a Medtronic 6949-lead in place. We'll plan to place a new ICD lead. 06/01/2013 10:59 AM  [name]  has presented today for surgery, with the diagnosis of battery depletion  The various methods of treatment have been discussed with the patient and family. After consideration of risks, benefits and other options for treatment, the patient has consented to  Procedure(s): ICD GENERATOR CHANGE (N/A) LEAD REVISION (N/A) as a surgical intervention .  The patient's history has been reviewed, patient examined, no change in status, stable for surgery.  I have reviewed the patient's chart and labs.  Questions were answered to the patient's satisfaction.     Norlene Duel.D.

## 2013-06-01 NOTE — CV Procedure (Signed)
Electrophysiology procedure note  Procedure: Insertion of a new ICD lead, removal of a previous implanted ICD generator which had reached elective replacement, and insertion of a new ICD generator, with defibrillation threshold testing.  Indication: Long-standing ischemic cardiomyopathy, chronic systolic heart failure, class II, status post bypass surgery, ejection fraction 30% despite maximal medical therapy, status post prior ICD insertion with his current device at elective replacement  Description of the procedure: After informed consent was obtained, the patient was taken to the diagnostic electrophysiology laboratory in the fasting state. After the usual preparation and draping, intravenous Versed and fentanyl were utilized for sedation. 30 cc of lidocaine was infiltrated into the left infraclavicular region. A 5 cm incision was carried out and electrocautery was utilized to dissect down to the ICD pocket. At this point the left subclavian vein was punctured and a new Medtronic active-fixation single coil defibrillator lead, serial number [id number] was advanced into the right ventricle. Mapping was carried out, and that the final site, the R waves measured 11 mV, and the pacing threshold was 0.7 V at 0.5 ms. The pacing impedance was 800 ohms. Temporal pacing did not stimulate the diaphragm. A large injury current was present with active-fixation of the lead. The lead was secured to the fascial plane with silk suture. The sewing sleeve was secured with silk suture. Electrocautery was then utilized into the ICD pocket and after freeing up the dense fibrous adhesions, the generator was removed with gentle traction. The pocket was irrigated with antibiotic urination. The pocket was revised with electrocautery to accommodate the new device and lead. The old defibrillator lead which was a Medtronic [id number] recall lead was capped. The new Medtronic single-chamber defibrillator, serial number [id number] was  connected to the new ICD lead and placed back in the subcutaneous pocket. The pocket was irrigated with additional antibiotic irrigation. The incision was closed with 2 layers of Vicryl suture. Benzoin and Steri-Strips for pain on the skin. At this point I scrubbed out of the case to supervise defibrillation threshold testing.  After the patient was deeply sedated with intravenous Versed and fentanyl under my direct supervision, ventricular fibrillation was induced with a T-wave shock. A 20 J shock was then utilized to terminate ventricular fibrillation, and restore sinus rhythm. Appropriate sensing of ventricular fibrillation was demonstrated. The shocking impedance was 70 ohms. At this point the patient was returned to his room in satisfactory condition.  Complications: There were no immediate procedure complications  Conclusion: Successful implantation of a Medtronic single chamber ICD and a new single-chamber ICD lead, with removal of the old Medtronic ICD generator, with defibrillation threshold testing, and revision of the ICD pocket.  Cristopher Peru, M.D.

## 2013-06-01 NOTE — Progress Notes (Signed)
UR Completed Mieka Leaton Graves-Bigelow, RN,BSN [phone]  

## 2013-06-02 ENCOUNTER — Ambulatory Visit (HOSPITAL_COMMUNITY): Payer: Medicare Other

## 2013-06-02 ENCOUNTER — Encounter (HOSPITAL_COMMUNITY): Payer: Self-pay | Admitting: Cardiology

## 2013-06-02 DIAGNOSIS — I2589 Other forms of chronic ischemic heart disease: Secondary | ICD-10-CM

## 2013-06-02 NOTE — Progress Notes (Signed)
Patient: Joseph Malone Date of Encounter: 06/02/2013, 10:10 AM Admit date: 06/01/2013     Subjective  Joseph Malone has no complaints this AM. He denies CP or SOB.   Objective  Physical Exam: Vitals: BP 130/91  Pulse 67  Temp(Src) 98.2 F (36.8 C) (Oral)  Resp 18  Ht 5\' 11"  (1.803 m)  Wt 250 lb 3.1 oz (113.488 kg)  BMI 34.91 kg/m2  SpO2 94% General: Well developed, well appearing 80 year old male in no acute distress. Neck: Supple. JVD not elevated. Lungs: Clear bilaterally to auscultation without wheezes, rales, or rhonchi. Breathing is unlabored. Heart: RRR S1 S2 without murmurs, rubs, or gallops.  Abdomen: Soft, non-distended. Extremities: No clubbing or cyanosis. No edema.  Distal pedal pulses are 2+ and equal bilaterally. Neuro: Alert and oriented X 3. Moves all extremities spontaneously. No focal deficits. Skin: Left upper chest / implant site intact without bleeding or hematoma.  Intake/Output:  Intake/Output Summary (Last 24 hours) at 06/02/13 1010 Last data filed at 06/02/13 0859  Gross per 24 hour  Intake    240 ml  Output      0 ml  Net    240 ml    Inpatient Medications:  . acetaminophen  1,000 mg Oral QHS  . aspirin  325 mg Oral QPM  . carvedilol  25 mg Oral BID WC  . losartan  50 mg Oral QHS  . multivitamin with minerals  1 tablet Oral Daily  . simvastatin  40 mg Oral QHS    Labs:    Component Value Date/Time   WBC 6.6 05/18/2013 1433   RBC 4.45 05/18/2013 1433   HGB 14.1 05/18/2013 1433   HCT 41.4 05/18/2013 1433   PLT 166.0 05/18/2013 1433   MCV 93.1 05/18/2013 1433   MCHC 34.2 05/18/2013 1433   RDW 13.1 05/18/2013 1433   LYMPHSABS 2.4 05/18/2013 1433   MONOABS 0.5 05/18/2013 1433   EOSABS 0.2 05/18/2013 1433   BASOSABS 0.1 05/18/2013 1433      Component Value Date/Time   NA 139 05/18/2013 1433   K 3.9 05/18/2013 1433   CL 107 05/18/2013 1433   CO2 28 05/18/2013 1433   GLUCOSE 138* 05/18/2013 1433   BUN 21 05/18/2013 1433   CREATININE 1.3 05/18/2013 1433   CALCIUM  9.1 05/18/2013 1433   GFRNONAA 68.88 09/17/2009 1156   GFRAA 76 09/25/2006 1008    Radiology/Studies: Dg Chest 2 View  06/02/2013   CLINICAL DATA:  Defibrillator placement  EXAM: CHEST  2 VIEW  COMPARISON:  Prior chest x-ray 03/15/2010  FINDINGS: Interval revision of cardiac rhythm maintenance device. The prior generator pack has been replaced. A second lead now projects over the right ventricle. Cardiac and mediastinal contours are unchanged. Patient is status post median sternotomy with evidence of prior CABG including LIMA bypass. No pneumothorax or large effusion. Stable bronchitic changes and mild interstitial prominence. No infiltrate or edema. No acute osseous abnormality.  IMPRESSION: No active cardiopulmonary disease.  Interval revision of left subclavian approach cardiac rhythm maintenance device.   Electronically Signed   By: Jacqulynn Cadet M.D.   On: 06/02/2013 08:15   Telemetry: SR   Assessment and Plan  1. ICD battery at [facility], ICD RV lead on recall (2694), now s/p ICD generator change with RV lead revision 2. Ischemic CM s/p ICD implant previously 3. Chronic systolic HF 4. CAD s/p CABG Joseph Malone is doing well post ICD gen change and RV lead revision. Wound  is intact without bleeding or hematoma. Device interrogation shows normal ICD function. Chest x-ray shows stable lead placement without pneumothorax. Reviewed DC instructions and follow-up. Plan for discharge home today.  Signed, Azzie Roup Edmisten PA-C  I have seen, examined the patient, and reviewed the above assessment and plan.  Changes to above are made where necessary.  Interrogation is reviewed and normal. DC to home with routine wound care and follow-up  Co Sign: [name], MD 06/02/2013 10:26 AM

## 2013-06-02 NOTE — Discharge Summary (Signed)
ELECTROPHYSIOLOGY DISCHARGE SUMMARY   Patient ID: Joseph Malone,  MRN: [id number], DOB/AGE: Dec 31, 1930 80 y.o.  Admit date: 06/01/2013 Discharge date: 06/02/2013  Primary Care Physician: Unice Cobble, MD Primary Cardiologist: Haroldine Laws, MD Primary EP: Lovena Le, MD  Primary Discharge Diagnosis:  1. ICD battery at [facility] and RV lead on recall (4098) s/p ICD generator change and RV lead revision  Secondary Discharge Diagnoses:  1. Ischemic CM, EF 35%, s/p ICD implant previously 2. Chronic systolic HF 3. CAD s/p CABG 4. HTN 5. Dyslipidemia  Procedures This Admission: 1. Insertion of a new ICD lead, removal of a previous implanted ICD generator and insertion of a new ICD generator with defibrillation threshold testing RV lead - new Medtronic active-fixation single coil defibrillator lead, serial number [id number] (the old defibrillator lead which was a Medtronic [id number] recall lead was capped) Device - Medtronic single-chamber defibrillator, serial number [id number]   History and Hospital Course:  [name] is an 80 year old man with a longstanding ischemic CM, EF 35%, s/p ICD implant, chronic systolic HF, CAD s/p CABG and HTN. His ICD battery was recently found to be at [facility] and he has a 6949 RV lead on recall. Therefore, he presented yesterday for ICD generator change and RV lead revision. [name] tolerated this procedure well without any immediate complication. He remains hemodynamically stable and afebrile. His chest xray shows stable lead placement without pneumothorax. His device interrogation shows normal ICD function with stable lead parameters/measurements. His implant site is intact without significant bleeding or hematoma. He has been given discharge instructions including wound care and activity restrictions. He will follow-up in 10 days for wound check. There were no changes made to his medications. Of note, [name] is on guideline-directed medical therapy with Coreg and ARB. He  has been seen, examined and deemed stable for discharge today by Dr. [name].   Discharge Vitals: Blood pressure 130/91, pulse 67, temperature 98.2 F (36.8 C), temperature source Oral, resp. rate 18, height 5\' 11"  (1.803 m), weight 250 lb 3.1 oz (113.488 kg), SpO2 94.00%.   Labs: Lab Results  Component Value Date   WBC 6.6 05/18/2013   HGB 14.1 05/18/2013   HCT 41.4 05/18/2013   MCV 93.1 05/18/2013   PLT 166.0 05/18/2013      Component Value Date/Time   NA 139 05/18/2013 1433   K 3.9 05/18/2013 1433   CL 107 05/18/2013 1433   CO2 28 05/18/2013 1433   GLUCOSE 138* 05/18/2013 1433   BUN 21 05/18/2013 1433   CREATININE 1.3 05/18/2013 1433   CALCIUM 9.1 05/18/2013 1433   GFRNONAA 68.88 09/17/2009 1156   GFRAA 76 09/25/2006 1008    Disposition:  The patient is being discharged in stable condition.  Follow-up: Follow-up Information   Follow up with [facility] On 06/13/2013. (At 4:30 PM for wound check)    Specialty:  Cardiology   Contact information:   [address] [place] [address] [phone]      Follow up with Cristopher Peru, MD On 09/01/2013. (At 9:30 AM)    Specialty:  Cardiology   Contact information:   [address] [address] [phone]      Discharge Medications:    Medication List         acetaminophen 500 MG tablet  Commonly known as:  TYLENOL  Take 1,000 mg by mouth daily. 2 by mouth at bedtime     aspirin 325  MG tablet  Take 325 mg by mouth every evening.     carvedilol 25 MG tablet  Commonly known as:  COREG  Take 1 tablet (25 mg total) by mouth 2 (two) times daily.     Flax Seed Oil 1000 MG Caps  Take 1,000 mg by mouth every evening.     folic acid 903 MCG tablet  Commonly known as:  FOLVITE  Take 400 mcg by mouth daily.     Glucosamine HCl 1500 MG Tabs  Take 1,500 mg by mouth every evening.     losartan 50 MG tablet  Commonly known as:  COZAAR  Take 1 tablet (50 mg total) by mouth at  bedtime.     multivitamin with minerals Tabs tablet  Take 1 tablet by mouth daily.     simvastatin 40 MG tablet  Commonly known as:  ZOCOR  Take 1 tablet (40 mg total) by mouth at bedtime.     VIAGRA 100 MG tablet  Generic drug:  sildenafil  Take 100 mg by mouth as needed for erectile dysfunction.     Vitamin D3 2000 UNITS Tabs  Take 2,000 Units by mouth every evening.       Duration of Discharge Encounter: Greater than 30 minutes including physician time.  [name], PA-C 06/02/2013, 12:36 PM  [name] MD

## 2013-06-02 NOTE — Progress Notes (Signed)
Reviewed discharge instructions with patient and wife and they stated their understanding.  Discharged home via wheelchair with wife via volunteers.  Sanda Linger

## 2013-06-02 NOTE — H&P (Signed)
  ICD Criteria  Current LVEF:30% ;Obtained > 3 months ago and < or = 6 months ago.   NYHA Functional Classification: Class II  Heart Failure History:  Yes, Duration of heart failure since onset is > 9 months  Non-Ischemic Dilated Cardiomyopathy History:  No.  Atrial Fibrillation/Atrial Flutter:  No.  Ventricular Tachycardia History:  No.  Cardiac Arrest History:  No  History of Syndromes with Risk of Sudden Death:  No.  Previous ICD:  Yes, ICD Type:  Single, Reason for ICD:  Primary prevention.  30%  Electrophysiology Study: No.  Prior MI: Yes, Most recent MI timeframe is > 40 days.  PPM: No.  OSA:  No  Patient Life Expectancy of >=1 year: Yes.  Anticoagulation Therapy:  Patient is NOT on anticoagulation therapy.   Beta Blocker Therapy:  Yes.   Ace Inhibitor/ARB Therapy:  Yes.

## 2013-06-02 NOTE — Discharge Instructions (Signed)
Supplemental Discharge Instructions for  Pacemaker/Defibrillator Patients  Activity No heavy lifting or vigorous activity with your left/right arm for 6 to 8 weeks.  Do not raise your left/right arm above your head for one week.  Gradually raise your affected arm as drawn below.           04/26                      04/27                       04/28                      04/29       NO DRIVING for 1 week; you may begin driving on 16/10/960405/02/2013. WOUND CARE   Keep the wound area clean and dry.  Do not get this area wet for one week. No showers for one week; you may shower on 06/10/2013.   The tape/steri-strips on your wound will fall off; do not pull them off.  No bandage is needed on the site.  DO  NOT apply any creams, oils, or ointments to the wound area.   If you notice any drainage or discharge from the wound, any swelling or bruising at the site, or you develop a fever > 101? F after you are discharged home, call the office at once.  Special Instructions   You are still able to use cellular telephones; use the ear opposite the side where you have your pacemaker/defibrillator.  Avoid carrying your cellular phone near your device.   When traveling through airports, show security personnel your identification card to avoid being screened in the metal detectors.  Ask the security personnel to use the hand wand.   Avoid arc welding equipment, MRI testing (magnetic resonance imaging), TENS units (transcutaneous nerve stimulators).  Call the office for questions about other devices.   Avoid electrical appliances that are in poor condition or are not properly grounded.   Microwave ovens are safe to be near or to operate.  Additional information for defibrillator patients should your device go off:   If your device goes off ONCE and you feel fine afterward, notify the device clinic nurses.   If your device goes off ONCE and you do not feel well afterward, call 911.   If your device goes off  TWICE, call 911.   If your device goes off THREE times in one day, call 911.  DO NOT DRIVE YOURSELF OR A FAMILY MEMBER WITH A DEFIBRILLATOR TO THE HOSPITAL--CALL 911.  ___________________________________________________________________________  Heart Failure -Heart failure means your heart has trouble pumping blood. This makes it hard for your body to work well. Heart failure is usually a long-term (chronic) condition. You must take good care of yourself and follow your doctor's treatment plan. HOME CARE  Take your heart medicine as told by your doctor.  Do not stop taking medicine unless your doctor tells you to.  Do not skip any dose of medicine.  Refill your medicines before they run out.  Take other medicines only as told by your doctor or pharmacist.  Stay active if told by your doctor. The elderly and people with severe heart failure should talk with a doctor about physical activity.  Eat heart healthy foods. Choose foods that are without trans fat and are low in saturated fat, cholesterol, and salt (sodium). This includes fresh or frozen fruits and vegetables,  fish, lean meats, fat-free or low-fat dairy foods, whole grains, and high-fiber foods. Lentils and dried peas and beans (legumes) are also good choices.  Limit salt if told by your doctor.  Cook in a healthy way. Roast, grill, broil, bake, poach, steam, or stir-fry foods.  Limit fluids as told by your doctor.  Weigh yourself every morning. Do this after you pee (urinate) and before you eat breakfast. Write down your weight to give to your doctor.  Take your blood pressure and write it down if your doctor tell you to.  Ask your doctor how to check your pulse. Check your pulse as told.  Lose weight if told by your doctor.  Stop smoking or chewing tobacco. Do not use gum or patches that help you quit without your doctor's approval.  Schedule and go to doctor visits as told.  Nonpregnant women should have no more  than 1 drink a day. Men should have no more than 2 drinks a day. Talk to your doctor about drinking alcohol.  Stop illegal drug use.  Stay current with shots (immunizations).  Manage your health conditions as told by your doctor.  Learn to manage your stress.  Rest when you are tired.  If it is really hot outside:  Avoid intense activities.  Use air conditioning or fans, or get in a cooler place.  Avoid caffeine and alcohol.  Wear loose-fitting, lightweight, and light-colored clothing.  If it is really cold outside:  Avoid intense activities.  Layer your clothing.  Wear mittens or gloves, a hat, and a scarf when going outside.  Avoid alcohol.  Learn about heart failure and get support as needed.  Get help to maintain or improve your quality of life and your ability to care for yourself as needed. GET HELP IF:   You gain 03 lb/1.4 kg or more in 1 day or 05 lb/2.3 kg in a week.  You are more short of breath than usual.  You cannot do your normal activities.  You tire easily.  You cough more than normal, especially with activity.  You have any or more puffiness (swelling) in areas such as your hands, feet, ankles, or belly (abdomen).  You cannot sleep because it is hard to breathe.  You feel like your heart is beating fast (palpitations).  You get dizzy or lightheaded when you stand up. GET HELP RIGHT AWAY IF:   You have trouble breathing.  There is a change in mental status, such as becoming less alert or not being able to focus.  You have chest pain or discomfort.  You faint. MAKE SURE YOU:   Understand these instructions.  Will watch your condition.  Will get help right away if you are not doing well or get worse. Document Released: 11/06/2007 Document Revised: 05/24/2012 Document Reviewed: 08/28/2011 [facility] Patient Information 2014 [place], [place].   Cardioverter Defibrillator Implantation, Care After Refer to this sheet in the next few  weeks. These instructions provide you with information on caring for yourself after you have had an implantable cardioverter defibrillator (ICD) put (implanted) in your chest. Your caregiver may also give you more specific instructions. Your treatment has been planned according to current medical practices, but problems sometimes occur. Call your caregiver if you have any problems or questions after your procedure.  HOME CARE INSTRUCTIONS  Medications  Only take your regular medications as directed by your caregiver. Make sure you receive specific instructions about when to take them and how much to take, especially if  they were stopped or changed.   Only take over-the-counter or prescription medications as directed by your caregiver.   If antibiotic medications were prescribed, take them as directed. Finish them even if you start to feel better.   Do not take any other medications without asking your caregiver first. Some medications, including certain painkillers, can cause bleeding after surgery.  Wound Care  Take off the bandage as directed after you are home (usually 1 2 days after surgery).  You may have pieces of tape called skin adhesive strips over the area where the cut was made (incision site). Let them fall off on their own.   Check the incision site every day to make sure it is not infected, bleeding, or starting to pull apart.   Do not use lotions or ointments near the incision site unless directed to do so.   Keep the incision area clean and dry for 2 3 days after the procedure or as directed by your caregiver. It takes several weeks for the incision site to completely heal.   Follow the specific bathing instructions provided by your caregiver. Do not take a bath until your caregiver says it is OK.  Activities  Try to walk a little every day. Exercising is important after this procedure. It is also important to use your shoulder on the side of the ICD in daily  tasks that do not require exaggerated motion.   Avoid sudden jerking, pulling, or chopping movements that pull your upper arm far away from your body for at least 6 weeks.   Do not lift your upper arm above your shoulders for at least 6 weeks. This means no tennis, golf, or swimming for this period of time. If you sleep with the arm above your head, use a restraint to prevent this from happening as you sleep.   You may go back to work when your caregiver says it is OK. Check with your caregiver before you start to drive or play sports.  Other Instructions  Follow diet instructions if they were provided. You should be able to eat what you usually do right away, but you may need to limit your salt intake.   Weigh yourself every day. If you suddenly gain weight, fluid may be building up in your body.   Always carry your ICD identification card with you. The card should list the implant date, device model, and manufacturer. Consider wearing a medical alert bracelet or necklace.   Tell all caregivers that you have an ICD. This may prevent them from giving you a magnetic resonance imaging (MRI) scan because of the strong magnets used during that test.   If you must pass through a metal detector, quickly walk through it. Do not stop under the detector or stand near it.   Avoid places or objects with a strong electric or magnetic field, including:   Engineer, maintenance. When at the airport, let officials know you have an ICD. Your ID card will let you be checked in a way that is safe for you and that will not damage your ICD. Also, do not let a security person wave a magnetic wand near your ICD. That can make it stop working.   Power plants.   Large electrical generators.   Radiofrequency transmission towers, such as cell phone and radio towers.   Do not use amateur (ham) radio equipment or electric (arc) welding torches. Some devices are safe to use if held at least 1 foot  from  your ICD. These include power tools, lawn mowers, and speakers. If you are unsure of whether something is safe to use, ask your caregiver.   You may safely use electric blankets, heating pads, computers, and microwave ovens.   Hold cell phones and remote controls at least 1 foot away from your ICD. When using your cell phone, hold it to the ear opposite of the ICD. Do not leave your cell phone in a pocket over the ICD.   Keep all follow-up appointments. This is how your caregiver makes sure your chest is healing the way it should. Ask your caregiver when you should come back to have your stitches or staples taken out.   Have your ICD checked every 3 6 months or as directed by your caregiver. Most ICDs last for 4 8 years before a new one is needed. SEEK MEDICAL CARE IF:   You feel one shock in your chest.  You gain weight all of a sudden.   Your legs or feet swell more than they have before.   It feels like your heart is fluttering or skipping beats (heart palpitations). SEEK IMMEDIATE MEDICAL CARE IF:   You have chest pain.  You feel more than one shock.  You feel more short of breath than you have felt before.  You feel more lightheaded than you have felt before.  You have problems with your incision site, such as swelling or bleeding, or it starts to open up.   You notice signs of infection around your incision site. Watch for:   Warmth.   Redness.   Worsening pain.   Swelling.   Fluid leaking from the incision site.   You have a fever.  MAKE SURE YOU:  Understand these instructions.  Will watch your condition.  Will get help right away if you are not doing well or get worse. Document Released: 08/16/2004 Document Revised: 09/29/2012 Document Reviewed: 02/18/2012 [facility] Patient Information 2014 [place], [place].

## 2013-06-13 ENCOUNTER — Ambulatory Visit (INDEPENDENT_AMBULATORY_CARE_PROVIDER_SITE_OTHER): Payer: Medicare Other | Admitting: *Deleted

## 2013-06-13 DIAGNOSIS — I2589 Other forms of chronic ischemic heart disease: Secondary | ICD-10-CM

## 2013-06-13 DIAGNOSIS — I5022 Chronic systolic (congestive) heart failure: Secondary | ICD-10-CM

## 2013-06-13 LAB — MDC_IDC_ENUM_SESS_TYPE_INCLINIC
Battery Remaining Longevity: 11.4
HighPow Impedance: 60 Ohm
Lead Channel Impedance Value: 551 Ohm
Lead Channel Setting Pacing Amplitude: 3.5 V
Lead Channel Setting Pacing Pulse Width: 0.4 ms
Lead Channel Setting Sensing Sensitivity: 0.3 mV
MDC IDC MSMT LEADCHNL RV PACING THRESHOLD AMPLITUDE: 0.5 V
MDC IDC MSMT LEADCHNL RV PACING THRESHOLD PULSEWIDTH: 0.4 ms
MDC IDC MSMT LEADCHNL RV SENSING INTR AMPL: 15.6 mV
MDC IDC SET ZONE DETECTION INTERVAL: 350 ms
MDC IDC SET ZONE DETECTION INTERVAL: 360 ms
MDC IDC STAT BRADY RV PERCENT PACED: 0.1 % — AB
Zone Setting Detection Interval: 450 ms

## 2013-06-13 NOTE — Progress Notes (Signed)
Wound check appointment. Steri-strips removed. Wound without redness or edema. Incision edges approximated, wound well healed. Normal device function. Threshold, sensing, and impedances consistent with implant measurements. Device programmed at 3.5V for extra safety margin until 3 month visit. Histogram distribution appropriate for patient and level of activity. No ventricular arrhythmias noted. Patient educated about wound care, arm mobility, lifting restrictions, shock plan. ROV on 7-23 with GT.

## 2013-06-27 ENCOUNTER — Encounter: Payer: Self-pay | Admitting: Internal Medicine

## 2013-09-01 ENCOUNTER — Encounter: Payer: Self-pay | Admitting: Internal Medicine

## 2013-09-01 ENCOUNTER — Ambulatory Visit (INDEPENDENT_AMBULATORY_CARE_PROVIDER_SITE_OTHER): Payer: Medicare Other | Admitting: Internal Medicine

## 2013-09-01 VITALS — BP 144/82 | HR 65 | Ht 71.0 in | Wt 249.0 lb

## 2013-09-01 DIAGNOSIS — Z95810 Presence of automatic (implantable) cardiac defibrillator: Secondary | ICD-10-CM

## 2013-09-01 DIAGNOSIS — I2589 Other forms of chronic ischemic heart disease: Secondary | ICD-10-CM

## 2013-09-01 DIAGNOSIS — I5022 Chronic systolic (congestive) heart failure: Secondary | ICD-10-CM

## 2013-09-01 LAB — MDC_IDC_ENUM_SESS_TYPE_INCLINIC
HighPow Impedance: 171 Ohm
HighPow Impedance: 65 Ohm
Lead Channel Impedance Value: 513 Ohm
Lead Channel Pacing Threshold Amplitude: 0.5 V
Lead Channel Pacing Threshold Pulse Width: 0.4 ms
Lead Channel Sensing Intrinsic Amplitude: 14 mV
Lead Channel Sensing Intrinsic Amplitude: 19.625 mV
Lead Channel Setting Pacing Amplitude: 2 V
MDC IDC MSMT BATTERY REMAINING LONGEVITY: 136 mo
MDC IDC MSMT BATTERY VOLTAGE: 3.09 V
MDC IDC SESS DTM: 20150723135142
MDC IDC SET LEADCHNL RV PACING PULSEWIDTH: 0.4 ms
MDC IDC SET LEADCHNL RV SENSING SENSITIVITY: 0.3 mV
MDC IDC SET ZONE DETECTION INTERVAL: 350 ms
MDC IDC SET ZONE DETECTION INTERVAL: 360 ms
MDC IDC SET ZONE DETECTION INTERVAL: 450 ms
MDC IDC STAT BRADY RV PERCENT PACED: 0.01 %

## 2013-09-01 NOTE — Patient Instructions (Signed)
Your physician wants you to follow-up in: 9 months with Dr Knox Saliva will receive a reminder letter in the mail two months in advance. If you don't receive a letter, please call our office to schedule the follow-up appointment.   Remote monitoring is used to monitor your Pacemaker or ICD from home. This monitoring reduces the number of office visits required to check your device to one time per year. It allows [place] to keep an eye on the functioning of your device to ensure it is working properly. You are scheduled for a device check from home on 12/05/13. You may send your transmission at any time that day. If you have a wireless device, the transmission will be sent automatically. After your physician reviews your transmission, you will receive a postcard with your next transmission date.

## 2013-09-01 NOTE — Assessment & Plan Note (Signed)
His Medtronic ICD and new RV lead are working normally. Will recheck in several months.

## 2013-09-01 NOTE — Assessment & Plan Note (Signed)
His symptoms are class 2A. I have encouraged him to maintain a low sodium diet.

## 2013-09-01 NOTE — Progress Notes (Signed)
HPI Mr. Sitter returns today for followup. He is a very pleasant 80 year old man with an ischemic cardiomyopathy, chronic systolic heart failure, status post ICD implantation. In the interim, he has undergone RV lead revision which has worked out well. He notes an occaisional cough at night and admits to dietary indiscretion with sodium. Allergies  Allergen Reactions  . Penicillins     Rash Because of a history of documented adverse serious drug reaction;Medi Alert bracelet  is recommended     Current Outpatient Prescriptions  Medication Sig Dispense Refill  . acetaminophen (TYLENOL) 500 MG tablet Take 2 tablets by mouth at bedtime      . aspirin 325 MG tablet Take 325 mg by mouth every evening.       . carvedilol (COREG) 25 MG tablet Take 1 tablet (25 mg total) by mouth 2 (two) times daily.  180 tablet  3  . Cholecalciferol (VITAMIN D3) 2000 UNITS TABS Take 2,000 Units by mouth every evening.       . Flaxseed, Linseed, (FLAX SEED OIL) 1000 MG CAPS Take 1,000 mg by mouth every evening.       . folic acid (FOLVITE) 759 MCG tablet Take 400 mcg by mouth daily.        . Glucosamine HCl 1500 MG TABS Take 1,500 mg by mouth every evening.       Marland Kitchen losartan (COZAAR) 50 MG tablet Take 1 tablet (50 mg total) by mouth at bedtime.  90 tablet  3  . Multiple Vitamin (MULTIVITAMIN WITH MINERALS) TABS Take 1 tablet by mouth daily.       . sildenafil (VIAGRA) 100 MG tablet Take 100 mg by mouth as needed for erectile dysfunction.      . simvastatin (ZOCOR) 40 MG tablet Take 1 tablet (40 mg total) by mouth at bedtime.  90 tablet  3   No current facility-administered medications for this visit.     Past Medical History  Diagnosis Date  . CAD (coronary artery disease)     s/p CABG  . Heart attack 2001  . Heart failure     CHF due to ischemic CM  . Ischemic cardiomyopathy   . Excess weight   . Hypertension   . Hyperlipidemia   . Fasting hyperglycemia   . BPH (benign prostatic hypertrophy)   .  Microscopic hematuria     Dr. [name]  . Diverticulosis     ROS:   All systems reviewed and negative except as noted in the HPI.   Past Surgical History  Procedure Laterality Date  . Coronary artery bypass graft  2000    1 vessel  . Appendectomy    . Pilonidal cyst excision    . Cataract extraction  2008    bilateral with lens implant  . Colonoscopy  2004    Tics; [facility]  . Transthoracic echocardiogram  08/29/2005  . Cardiac defibrillator placement  2005    Medtronic; s/p ICD gen change and RV lead revision April 2015  . Cardiac catheterization  2000  . Cystoscopy  06/2012    Dr [name]     Family History  Problem Relation Age of Onset  . Hypertension Father   . Heart attack Father 73  . Diabetes Brother     X 2  . Heart attack Brother 54  . Heart attack Paternal Uncle 38  . Sudden death Paternal Grandfather 3    ? heat stroke  . Lung cancer Sister     "Asian lung  cancer"  . Stroke Neg Hx      History   Social History  . Marital Status: Married    Spouse Name: N/A    Number of Children: N/A  . Years of Education: N/A   Occupational History  . Not on file.   Social History Main Topics  . Smoking status: Passive Smoke Exposure - Never Smoker  . Smokeless tobacco: Never Used  . Alcohol Use: No  . Drug Use: No  . Sexual Activity: No   Other Topics Concern  . Not on file   Social History Narrative  . No narrative on file     BP 144/82  Pulse 65  Ht 5\' 11"  (1.803 m)  Wt 249 lb (112.946 kg)  BMI 34.74 kg/m2  Physical Exam:  Well appearing 80 year old man, NAD HEENT: Unremarkable Neck:  7 cm JVD, no thyromegally Lungs:  Clear with no wheezes, rales, or rhonchi. HEART:  Regular rate rhythm, no murmurs, no rubs, no clicks Abd:  soft, obese, positive bowel sounds, no organomegally, no rebound, no guarding Ext:  2 plus pulses, no edema, no cyanosis, no clubbing Skin:  No rashes no nodules Neuro:  CN II through XII intact, motor grossly  intact  DEVICE  Normal device function.  See PaceArt for details.   Assess/Plan:

## 2013-11-17 ENCOUNTER — Encounter: Payer: Self-pay | Admitting: Internal Medicine

## 2013-11-17 ENCOUNTER — Telehealth (HOSPITAL_COMMUNITY): Payer: Self-pay | Admitting: Vascular Surgery

## 2013-11-17 ENCOUNTER — Other Ambulatory Visit (INDEPENDENT_AMBULATORY_CARE_PROVIDER_SITE_OTHER): Payer: Medicare Other

## 2013-11-17 ENCOUNTER — Ambulatory Visit (INDEPENDENT_AMBULATORY_CARE_PROVIDER_SITE_OTHER): Payer: Medicare Other | Admitting: Internal Medicine

## 2013-11-17 VITALS — BP 126/72 | HR 64 | Temp 98.3°F | Resp 13 | Ht 71.0 in | Wt 247.1 lb

## 2013-11-17 DIAGNOSIS — E1151 Type 2 diabetes mellitus with diabetic peripheral angiopathy without gangrene: Secondary | ICD-10-CM

## 2013-11-17 DIAGNOSIS — E1159 Type 2 diabetes mellitus with other circulatory complications: Secondary | ICD-10-CM

## 2013-11-17 DIAGNOSIS — I10 Essential (primary) hypertension: Secondary | ICD-10-CM

## 2013-11-17 DIAGNOSIS — K5730 Diverticulosis of large intestine without perforation or abscess without bleeding: Secondary | ICD-10-CM

## 2013-11-17 DIAGNOSIS — E785 Hyperlipidemia, unspecified: Secondary | ICD-10-CM

## 2013-11-17 LAB — LIPID PANEL
CHOLESTEROL: 115 mg/dL (ref 0–200)
HDL: 36.6 mg/dL — AB (ref >39.00)
LDL CALC: 58 mg/dL (ref 0–99)
NonHDL: 78.4
Total CHOL/HDL Ratio: 3
Triglycerides: 101 mg/dL (ref 0.0–149.0)
VLDL: 20.2 mg/dL (ref 0.0–40.0)

## 2013-11-17 LAB — MICROALBUMIN / CREATININE URINE RATIO
Creatinine,U: 190.5 mg/dL
Microalb Creat Ratio: 0.4 mg/g (ref 0.0–30.0)
Microalb, Ur: 0.7 mg/dL (ref 0.0–1.9)

## 2013-11-17 LAB — CBC WITH DIFFERENTIAL/PLATELET
BASOS ABS: 0 10*3/uL (ref 0.0–0.1)
Basophils Relative: 0.6 % (ref 0.0–3.0)
Eosinophils Absolute: 0.2 10*3/uL (ref 0.0–0.7)
Eosinophils Relative: 3.1 % (ref 0.0–5.0)
HEMATOCRIT: 43.9 % (ref 39.0–52.0)
Hemoglobin: 14.8 g/dL (ref 13.0–17.0)
LYMPHS ABS: 2.4 10*3/uL (ref 0.7–4.0)
Lymphocytes Relative: 37.7 % (ref 12.0–46.0)
MCHC: 33.6 g/dL (ref 30.0–36.0)
MCV: 92.6 fl (ref 78.0–100.0)
MONOS PCT: 8.3 % (ref 3.0–12.0)
Monocytes Absolute: 0.5 10*3/uL (ref 0.1–1.0)
NEUTROS PCT: 50.3 % (ref 43.0–77.0)
Neutro Abs: 3.2 10*3/uL (ref 1.4–7.7)
Platelets: 163 10*3/uL (ref 150.0–400.0)
RBC: 4.74 Mil/uL (ref 4.22–5.81)
RDW: 13 % (ref 11.5–15.5)
WBC: 6.4 10*3/uL (ref 4.0–10.5)

## 2013-11-17 LAB — HEPATIC FUNCTION PANEL
ALBUMIN: 3.6 g/dL (ref 3.5–5.2)
ALT: 27 U/L (ref 0–53)
AST: 24 U/L (ref 0–37)
Alkaline Phosphatase: 51 U/L (ref 39–117)
BILIRUBIN TOTAL: 0.6 mg/dL (ref 0.2–1.2)
Bilirubin, Direct: 0.1 mg/dL (ref 0.0–0.3)
Total Protein: 7.4 g/dL (ref 6.0–8.3)

## 2013-11-17 LAB — BASIC METABOLIC PANEL
BUN: 24 mg/dL — AB (ref 6–23)
CALCIUM: 9.3 mg/dL (ref 8.4–10.5)
CO2: 24 mEq/L (ref 19–32)
CREATININE: 1.2 mg/dL (ref 0.4–1.5)
Chloride: 108 mEq/L (ref 96–112)
GFR: 61.64 mL/min (ref >60.00)
Glucose, Bld: 143 mg/dL — ABNORMAL HIGH (ref 70–99)
POTASSIUM: 4.2 meq/L (ref 3.5–5.1)
Sodium: 140 mEq/L (ref 135–145)

## 2013-11-17 LAB — HEMOGLOBIN A1C: Hgb A1c MFr Bld: 6.5 % (ref 4.6–6.5)

## 2013-11-17 LAB — TSH: TSH: 0.43 u[IU]/mL (ref 0.35–4.50)

## 2013-11-17 NOTE — Progress Notes (Signed)
Subjective:    Patient ID: Joseph Malone, male    DOB: 1930/12/23, 80 y.o.   MRN: [id number]  HPI He is here to assess status of active health conditions:  Diet/ nutrition: Weight Watchers with 10# loss Exercise program: walking > 4X/ week for 30-40 minutes . Occasional chest pain which has been evaluated by Cardiology.  HYPERTENSION: Disease Monitoring: Blood pressure range/ average : 120/72 on average Medication Compliance: yes  Diabetes : last A1c 6.6 % FBS range/average: 105-130, average 109 Highest 2 hr post meal glucose: no Medication compliance: Hypoglycemia:no Ophthamology care:DUE Podiatry care:not UTD  HYPERLIPIDEMIA: Disease Monitoring: last LDL 51 , HDL 43.2 in 10/14 Medication Compliance: yes    Review of Systems  Palpitations:no     Dyspnea: yes but improves with continuing to walk; no wheezing. Edema:no Claudication: no Lightheadedness,Syncope:no Polyuria/phagia/dipsia:   no   Blurred vision /diplopia/lossof vision: no Limb numbness/tingling/burning:no Non healing skin lesions:no Abd pain, bowel changes: no  Myalgias: no Memory loss:some        Objective:   Physical Exam Gen.: Healthy and well-nourished in appearance. 6X5 cm boss L forehead.Alert, appropriate and cooperative throughout exam. Appears younger than stated age  Head: Normocephalic without obvious abnormalities;  Pattern alopecia  Eyes: No corneal or conjunctival inflammation noted. Pupils equal round reactive to light and accommodation. Extraocular motion intact.OS ptosis > OD Ears: External  ear exam reveals no significant lesions or deformities. Canals clear .TMs normal. Hearing is grossly normal bilaterally. Nose: External nasal exam reveals no deformity or inflammation. Nasal mucosa are pink and moist. No lesions or exudates noted.   Mouth: Oral mucosa and oropharynx reveal no lesions or exudates. Teeth in good repair. Neck: No deformities, masses, or tenderness noted. Range of  motion & Thyroid normal Lungs: Normal respiratory effort; chest expands symmetrically. Lungs are clear to auscultation without rales, wheezes, or increased work of breathing. Heart: Normal rate and rhythm. Normal S1 and S2. No gallop, click, or rub. S4 w/o murmur.Pacer L chest Abdomen: Bowel sounds normal; abdomen soft and nontender. No masses, organomegaly or hernias noted. Genitalia: as per Urology                                 Musculoskeletal/extremities:Slightly accentuated curvature of upper thoracic spine. No clubbing, cyanosis, edema, or significant extremity  deformity noted. Range of motion normal .Tone & strength normal. Hand joints normal  Fingernail health good. Able to lie down & sit up w/o help. Negative SLR bilaterally Vascular: Carotid, radial artery, dorsalis pedis and  posterior tibial pulses are full and equal. No bruits present. Neurologic: Alert and oriented x3. Deep tendon reflexes symmetrical and normal.  Gait normal .       Skin: Intact without suspicious lesions or rashes. Lymph: No cervical, axillary lymphadenopathy present. Psych: Mood and affect are normal. Normally interactive                                                                                        Assessment & Plan:  See Current Assessment & Plan in Problem List under specific Diagnosis

## 2013-11-17 NOTE — Assessment & Plan Note (Signed)
Lipids, LFTs, TSH  

## 2013-11-17 NOTE — Telephone Encounter (Signed)
Refill Viagra 100 mg.. Please advise

## 2013-11-17 NOTE — Progress Notes (Signed)
Pre visit review using our clinic review tool, if applicable. No additional management support is needed unless otherwise documented below in the visit note. 

## 2013-11-17 NOTE — Patient Instructions (Signed)
Your next office appointment will be determined based upon review of your pending labs. Those instructions will be transmitted to you through My Chart . 

## 2013-11-17 NOTE — Assessment & Plan Note (Signed)
A1c , urine microalbumin, BMET 

## 2013-11-17 NOTE — Assessment & Plan Note (Signed)
CBC & dif 

## 2013-11-17 NOTE — Assessment & Plan Note (Signed)
Blood pressure goals reviewed. BMET 

## 2013-11-18 ENCOUNTER — Telehealth: Payer: Self-pay | Admitting: Internal Medicine

## 2013-11-18 MED ORDER — SILDENAFIL CITRATE 100 MG PO TABS: 100.0000 mg | ORAL_TABLET | ORAL | Status: DC | PRN

## 2013-11-18 NOTE — Telephone Encounter (Signed)
emmi emailed °

## 2013-11-19 ENCOUNTER — Other Ambulatory Visit: Payer: Self-pay | Admitting: Internal Medicine

## 2013-11-19 DIAGNOSIS — E1151 Type 2 diabetes mellitus with diabetic peripheral angiopathy without gangrene: Secondary | ICD-10-CM

## 2013-12-06 ENCOUNTER — Ambulatory Visit (INDEPENDENT_AMBULATORY_CARE_PROVIDER_SITE_OTHER): Payer: Medicare Other | Admitting: *Deleted

## 2013-12-06 DIAGNOSIS — I5022 Chronic systolic (congestive) heart failure: Secondary | ICD-10-CM

## 2013-12-06 DIAGNOSIS — I255 Ischemic cardiomyopathy: Secondary | ICD-10-CM

## 2013-12-06 LAB — MDC_IDC_ENUM_SESS_TYPE_REMOTE
Battery Remaining Longevity: 135 mo
Battery Voltage: 3.07 V
Date Time Interrogation Session: 20151027152552
HighPow Impedance: 67 Ohm
Lead Channel Impedance Value: 513 Ohm
Lead Channel Pacing Threshold Pulse Width: 0.4 ms
Lead Channel Sensing Intrinsic Amplitude: 13.625 mV
Lead Channel Setting Pacing Amplitude: 2 V
Lead Channel Setting Pacing Pulse Width: 0.4 ms
Lead Channel Setting Sensing Sensitivity: 0.3 mV
MDC IDC MSMT LEADCHNL RV IMPEDANCE VALUE: 456 Ohm
MDC IDC MSMT LEADCHNL RV PACING THRESHOLD AMPLITUDE: 0.5 V
MDC IDC MSMT LEADCHNL RV SENSING INTR AMPL: 13.625 mV
MDC IDC SET ZONE DETECTION INTERVAL: 350 ms
MDC IDC STAT BRADY RV PERCENT PACED: 0.01 %
Zone Setting Detection Interval: 360 ms
Zone Setting Detection Interval: 450 ms

## 2013-12-06 NOTE — Progress Notes (Signed)
Remote ICD transmission.   

## 2013-12-28 ENCOUNTER — Ambulatory Visit (HOSPITAL_COMMUNITY)
Admission: RE | Admit: 2013-12-28 | Discharge: 2013-12-28 | Disposition: A | Discharge status: Home / Self Care | Payer: Medicare Other | Source: Ambulatory Visit | Attending: Internal Medicine | Admitting: Internal Medicine

## 2013-12-28 VITALS — BP 124/68 | HR 80 | Wt 246.8 lb

## 2013-12-28 DIAGNOSIS — I255 Ischemic cardiomyopathy: Secondary | ICD-10-CM | POA: Insufficient documentation

## 2013-12-28 DIAGNOSIS — E785 Hyperlipidemia, unspecified: Secondary | ICD-10-CM | POA: Diagnosis not present

## 2013-12-28 DIAGNOSIS — N400 Enlarged prostate without lower urinary tract symptoms: Secondary | ICD-10-CM | POA: Insufficient documentation

## 2013-12-28 DIAGNOSIS — I5022 Chronic systolic (congestive) heart failure: Secondary | ICD-10-CM | POA: Insufficient documentation

## 2013-12-28 DIAGNOSIS — E7439 Other disorders of intestinal carbohydrate absorption: Secondary | ICD-10-CM | POA: Insufficient documentation

## 2013-12-28 DIAGNOSIS — Z79899 Other long term (current) drug therapy: Secondary | ICD-10-CM | POA: Insufficient documentation

## 2013-12-28 DIAGNOSIS — I10 Essential (primary) hypertension: Secondary | ICD-10-CM | POA: Diagnosis not present

## 2013-12-28 DIAGNOSIS — E669 Obesity, unspecified: Secondary | ICD-10-CM | POA: Insufficient documentation

## 2013-12-28 DIAGNOSIS — I252 Old myocardial infarction: Secondary | ICD-10-CM | POA: Diagnosis not present

## 2013-12-28 DIAGNOSIS — Z951 Presence of aortocoronary bypass graft: Secondary | ICD-10-CM | POA: Diagnosis not present

## 2013-12-28 DIAGNOSIS — I2510 Atherosclerotic heart disease of native coronary artery without angina pectoris: Secondary | ICD-10-CM | POA: Insufficient documentation

## 2013-12-28 DIAGNOSIS — Z7982 Long term (current) use of aspirin: Secondary | ICD-10-CM | POA: Diagnosis not present

## 2013-12-28 DIAGNOSIS — Z95810 Presence of automatic (implantable) cardiac defibrillator: Secondary | ICD-10-CM | POA: Diagnosis not present

## 2013-12-28 DIAGNOSIS — I509 Heart failure, unspecified: Secondary | ICD-10-CM | POA: Diagnosis present

## 2013-12-28 MED ORDER — ASPIRIN 81 MG PO TABS: 81.0000 mg | ORAL_TABLET | Freq: Every evening | ORAL | Status: DC

## 2013-12-28 NOTE — Addendum Note (Signed)
Encounter addended by: Scarlette Calico, RN on: 12/28/2013  2:33 PM<BR>     Documentation filed: Patient Instructions Section, Orders

## 2013-12-28 NOTE — Patient Instructions (Signed)
Decrease Aspirin to 81 mg daily  We will contact you in 1 year to schedule your next appointment.

## 2013-12-28 NOTE — Progress Notes (Signed)
Patient ID: Joseph Malone, male   DOB: February 23, 1930, 80 y.o.   MRN: [id number] HPI: Joseph Malone  is a pleasant 80 year old male with a history of coronary artery disease status post large anterior MI with coronary  bypass grafting in 2001 with a LIMA to the LAD.  He has history of  congestive heart failure secondary to resultant ischemic cardiomyopathy,  ejection fraction however is in the 35-40% (08/2005).  He is status post  single-chamber ICD.  He did have a nuclear study in March 01, 2006, which showed an EF of 38% a large anterior infarction, but no ischemia. ETT 11/2009 was normal (8:13min on Bruce)  Echo 12/14 EF 30-35% + ICD (followed by Dr. Lovena Le) - ICD gen chang 06/01/13  The rest of his medical history is notable for obesity, hypertension,  hyperlipidemia, and glucose intolerance.   Yearly Follow up: Feeling good. Continues with exertional dyspnea. No change. Walks at brisk pace for 93mins 3x/week. No chest pain. Does have chest soreness which is chronic.   Denies palpitations, edema, dizziness or orthopnea. Weight stable 240 lbs. Taking medications as prescribed. No shocks from ICD. SBP at home 120s after he takes his medications.   Labs: (11/2012)L: K+ 4.1, Cr. 1.1, cholesterol 122, TG 137, HDL 43, LDL 51            (11/17/13)  TC 115, TG 101, HDL 37, LDL 58  HgbA1c 6.5  ROS: All systems negative except as listed in HPI, PMH and Problem List.  Past Medical History  Diagnosis Date  . CAD (coronary artery disease)     s/p CABG  . Heart attack 2001  . Heart failure     CHF due to ischemic CM  . Ischemic cardiomyopathy   . Excess weight   . Hypertension   . Hyperlipidemia   . Fasting hyperglycemia   . BPH (benign prostatic hypertrophy)   . Microscopic hematuria     [facility]  . Diverticulosis     Current Outpatient Prescriptions  Medication Sig Dispense Refill  . acetaminophen (TYLENOL) 500 MG tablet Take 2 tablets by mouth at bedtime    . aspirin 325 MG tablet Take 325 mg  by mouth every evening.     . carvedilol (COREG) 25 MG tablet Take 1 tablet (25 mg total) by mouth 2 (two) times daily. 180 tablet 3  . Cholecalciferol (VITAMIN D3) 2000 UNITS TABS Take 2,000 Units by mouth every evening.     . Flaxseed, Linseed, (FLAX SEED OIL) 1000 MG CAPS Take 1,000 mg by mouth every evening.     . folic acid (FOLVITE) 938 MCG tablet Take 400 mcg by mouth daily.      . Glucosamine HCl 1500 MG TABS Take 1,500 mg by mouth every evening.     Marland Kitchen losartan (COZAAR) 50 MG tablet Take 1 tablet (50 mg total) by mouth at bedtime. 90 tablet 3  . Multiple Vitamin (MULTIVITAMIN WITH MINERALS) TABS Take 1 tablet by mouth daily.     . sildenafil (VIAGRA) 100 MG tablet Take 1 tablet (100 mg total) by mouth as needed for erectile dysfunction. 10 tablet 3  . simvastatin (ZOCOR) 40 MG tablet Take 1 tablet (40 mg total) by mouth at bedtime. 90 tablet 3   No current facility-administered medications for this encounter.   Filed Vitals:   12/28/13 1353  BP: 124/68  Pulse: 80  Weight: 246 lb 12 oz (111.925 kg)  SpO2: 94%    PHYSICAL EXAM: General: Elderly appearing. No  resp difficulty HEENT: normal; large knot on L side of forehead.  Neck: supple. JVP flat. Carotids 2+ bilaterally; no bruits. No lymphadenopathy or thryomegaly appreciated. Cor: PMI normal. Regular rate & rhythm. No rubs, gallops or murmurs. Lungs: clear Abdomen: soft, nontender, nondistended. No hepatosplenomegaly. No bruits or masses. Good bowel sounds. Extremities: no cyanosis, clubbing, rash, edema Neuro: alert & orientedx3, cranial nerves grossly intact. Moves all 4 extremities w/o difficulty. Affect pleasant.   ECG: NSR 70 Previous septal MI. LVH   ASSESSMENT & PLAN:  1) CAD: s/p CABG 2001 - No s/s of ischemia. Will continue medical management (BB, statin, and ASA) - Can decrease ASA to 81 - Given excellent functional capacity without angina will not pursue f/u stress test at this time  2) Chronic systolic HF:  ICM, EF 99-35% (08/2005) s/p ICD.  NYHA II-III symptoms. Volume status stable. He is not on any diuretics. - Will continue coreg at goal dose 25 mg BID and losartan 50 mg daily. Unable to tolerate higher doses of losartan in past due to dizziness. - Reinforced the need and importance of daily weights, a low sodium diet, and fluid restriction (less than 2 L a day). Instructed to call the HF clinic if weight increases more than 3 lbs overnight or 5 lbs in a week.   3) HTN - controlled on BB and ARB 4) HLD - managed by PCP with excellent lipids in 10/14.   F/U 1 year Glori Bickers MD 2:18 PM

## 2013-12-29 ENCOUNTER — Encounter: Payer: Self-pay | Admitting: Cardiology

## 2013-12-29 NOTE — Addendum Note (Signed)
Encounter addended by: Georga Kaufmann, CCT on: 12/29/2013  8:49 AM<BR>     Documentation filed: Charges VN

## 2014-01-02 ENCOUNTER — Other Ambulatory Visit (HOSPITAL_COMMUNITY): Payer: Self-pay | Admitting: Cardiology

## 2014-01-02 DIAGNOSIS — I5022 Chronic systolic (congestive) heart failure: Secondary | ICD-10-CM

## 2014-01-02 DIAGNOSIS — E785 Hyperlipidemia, unspecified: Secondary | ICD-10-CM

## 2014-01-02 MED ORDER — SIMVASTATIN 40 MG PO TABS: 40.0000 mg | ORAL_TABLET | Freq: Every day | ORAL | Status: DC

## 2014-01-02 MED ORDER — LOSARTAN POTASSIUM 50 MG PO TABS: 50.0000 mg | ORAL_TABLET | Freq: Every day | ORAL | Status: DC

## 2014-01-04 ENCOUNTER — Encounter: Payer: Self-pay | Admitting: Internal Medicine

## 2014-01-19 ENCOUNTER — Encounter (HOSPITAL_COMMUNITY): Payer: Self-pay | Admitting: Internal Medicine

## 2014-01-19 ENCOUNTER — Ambulatory Visit (INDEPENDENT_AMBULATORY_CARE_PROVIDER_SITE_OTHER): Payer: Medicare Other | Admitting: Family Medicine

## 2014-01-19 VITALS — BP 136/80 | HR 80 | Temp 98.5°F | Ht 71.0 in | Wt 247.0 lb

## 2014-01-19 DIAGNOSIS — N529 Male erectile dysfunction, unspecified: Secondary | ICD-10-CM | POA: Insufficient documentation

## 2014-01-19 DIAGNOSIS — I2510 Atherosclerotic heart disease of native coronary artery without angina pectoris: Secondary | ICD-10-CM

## 2014-01-19 DIAGNOSIS — E785 Hyperlipidemia, unspecified: Secondary | ICD-10-CM

## 2014-01-19 DIAGNOSIS — I10 Essential (primary) hypertension: Secondary | ICD-10-CM

## 2014-01-19 DIAGNOSIS — I5022 Chronic systolic (congestive) heart failure: Secondary | ICD-10-CM

## 2014-01-19 NOTE — Patient Instructions (Signed)
Things look great/stable today.   You do have diabetes but it is controlled without medicine.   Look at your feet daily-consider lotion.   See me in 6 months and we will recheck your diabetes.   6 months for now will be your regular schedule.   Remember at next eye exam to tell them you have diabetes and to send [place] the records.

## 2014-01-19 NOTE — Assessment & Plan Note (Signed)
Well controlled with diet/exercise. Informed patient of diagnosis given 2 subsequent a1c 6.5 or greater.

## 2014-01-19 NOTE — Progress Notes (Signed)
Joseph Reddish, MD Phone: [phone]  Subjective:  Patient presents today to establish care with me as their new primary care provider. Patient was formerly a patient of Dr. Linna Darner. Chief complaint-noted.   DIABETES Type II-good control with diet and exercise, patient unaware of diagnosis Lab Results  Component Value Date   HGBA1C 6.5 11/17/2013   HGBA1C 6.6* 11/16/2012   HGBA1C 6.4 10/31/2011  Blood Sugars per patient-does not check Regular Exercise-walk 3x a week for 30-40 minutes On Aspirin-yes On statin-yes Daily foot monitoring-yes Health Maintenance Due  Topic Date Due  . FOOT EXAM -today 02/11/1942  . OPHTHALMOLOGY EXAM - 01/2014 02/11/1940  . COLONOSCOPY - not a candidate 09/27/2012  . INFLUENZA VACCINE - already received 09/10/2013  ROS- Denies Polyuria, Vision changes, feet or hand numbness/pain/tingling. Denies  Hypoglycemia symptoms (shaky, sweaty, hungry, weak anxious, tremor, palpitations, confusion, behavior change).   Hypertension-well controlled  Chronic systolic heart failure- stable BP Readings from Last 3 Encounters:  01/19/14 136/80  12/28/13 124/68  11/17/13 126/72  Home BP monitoring-no Compliant with medications-yes without side effects ROS-Shortness of breath with walking up hill, walks 1 mile flat without problems.  Mild chest pain thought MSK- not exertional and not relieved by rest Weight stable  Hyperlipidemia-good control  Lab Results  Component Value Date   LDLCALC 58 11/17/2013  On statin: simvastatin 40mg  Regular exercise: yes 3x a week ROS- see above  The following were reviewed and entered/updated in epic: Past Medical History  Diagnosis Date  . CAD (coronary artery disease)     s/p CABG  . Heart attack 2001  . Heart failure     CHF due to ischemic CM  . Ischemic cardiomyopathy   . Excess weight   . Hypertension   . Hyperlipidemia   . Fasting hyperglycemia   . BPH (benign prostatic hypertrophy)   . Microscopic hematuria      [facility]  . Diverticulosis   . Diverticulosis of colon without hemorrhage 10/22/2007    Qualifier: Diagnosis of  By: Linna Darner MD, [name]     Patient Active Problem List   Diagnosis Date Noted  . Chronic systolic heart failure 40/34/7425    Priority: High  . Cardiomyopathy, ischemic 10/02/2009    Priority: High  . ICD-Medtronic 01/16/2009    Priority: High  . DM (diabetes mellitus) type II controlled peripheral vascular disorder 09/14/2008    Priority: High  . CAD in native artery 09/25/2006    Priority: High  . Hyperlipidemia 04/22/2007    Priority: Medium  . Essential hypertension 09/25/2006    Priority: Medium  . Erectile dysfunction 01/19/2014    Priority: Low  . Basal cell cancer 09/29/2012    Priority: Low  . Solitary pulmonary nodule 07/12/2012    Priority: Low  . BPH without obstruction/lower urinary tract symptoms 10/22/2007    Priority: Low  . Osteoarthritis 09/25/2006    Priority: Low  . Hematuria 09/25/2006    Priority: Low   Past Surgical History  Procedure Laterality Date  . Coronary artery bypass graft  2000    1 vessel  . Appendectomy    . Pilonidal cyst excision    . Cataract extraction  2008    bilateral with lens implant  . Colonoscopy  2004    Tics; [facility]  . Transthoracic echocardiogram  08/29/2005  . Cardiac defibrillator placement  2005    Medtronic; s/p ICD gen change and RV lead revision April 2015  . Cardiac catheterization  2000  . Cystoscopy  06/2012    Dr [name]  . Implantable cardioverter defibrillator (icd) generator change N/A 06/01/2013    Procedure: ICD GENERATOR CHANGE;  Surgeon: [name], MD;  Location: [facility] CATH LAB;  Service: Cardiovascular;  Laterality: N/A;  . Lead revision N/A 06/01/2013    Procedure: LEAD REVISION;  Surgeon: [name], MD;  Location: [facility] CATH LAB;  Service: Cardiovascular;  Laterality: N/A;    Family History  Problem Relation Age of Onset  . Hypertension Father   . Heart attack  Father 42  . Diabetes Brother     X 2  . Heart attack Brother 9  . Heart attack Paternal Uncle 29  . Sudden death Paternal Grandfather 48    ? heat stroke  . Lung cancer Sister     "Asian lung cancer"  . Stroke Neg Hx     Medications- reviewed and updated Current Outpatient Prescriptions  Medication Sig Dispense Refill  . acetaminophen (TYLENOL) 500 MG tablet Take 2 tablets by mouth at bedtime    . aspirin 81 MG tablet Take 1 tablet (81 mg total) by mouth every evening.    . carvedilol (COREG) 25 MG tablet Take 1 tablet (25 mg total) by mouth 2 (two) times daily. 180 tablet 3  . Cholecalciferol (VITAMIN D3) 2000 UNITS TABS Take 2,000 Units by mouth every evening.     . Flaxseed, Linseed, (FLAX SEED OIL) 1000 MG CAPS Take 1,000 mg by mouth every evening.     . folic acid (FOLVITE) 419 MCG tablet Take 400 mcg by mouth daily.      . Glucosamine HCl 1500 MG TABS Take 1,500 mg by mouth every evening.     Marland Kitchen losartan (COZAAR) 50 MG tablet Take 1 tablet (50 mg total) by mouth at bedtime. 90 tablet 3  . Multiple Vitamin (MULTIVITAMIN WITH MINERALS) TABS Take 1 tablet by mouth daily.     . sildenafil (VIAGRA) 100 MG tablet Take 1 tablet (100 mg total) by mouth as needed for erectile dysfunction. 10 tablet 3  . simvastatin (ZOCOR) 40 MG tablet Take 1 tablet (40 mg total) by mouth at bedtime. 90 tablet 3   No current facility-administered medications for this visit.    Allergies-reviewed and updated Allergies  Allergen Reactions  . Penicillins     Rash Because of a history of documented adverse serious drug reaction;Medi Alert bracelet  is recommended    History   Social History  . Marital Status: Married    Spouse Name: N/A    Number of Children: N/A  . Years of Education: N/A   Social History Main Topics  . Smoking status: Passive Smoke Exposure - Never Smoker  . Smokeless tobacco: Never Used  . Alcohol Use: No  . Drug Use: No  . Sexual Activity: No   Other Topics Concern    . None   Social History Narrative   Married 1957. 4 children 2 boys 2 girls. 15 grandkids.  4 greatgrandkids.       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       Hobbies: genealogy, travel      No HCPOA-advised to do this.     ROS--See HPI   Objective: BP 136/80 mmHg  Pulse 80  Temp(Src) 98.5 F (36.9 C) (Oral)  Ht 5\' 11"  (1.803 m)  Wt 247 lb (112.038 kg)  BMI 34.46 kg/m2 Gen: NAD, resting comfortably, lipoma left forehead CV: RRR no murmurs rubs or  gallops Lungs: CTAB no crackles, wheeze, rhonchi Abdomen: soft/nontender/nondistended/normal bowel sounds.  Ext: no edema Skin: warm, dry, no rash Neuro: grossly normal, moves all extremities, PERRLA   Normal diabetic foot exam  Assessment/Plan:  DM (diabetes mellitus) type II controlled peripheral vascular disorder Well controlled with diet/exercise. Informed patient of diagnosis given 2 subsequent a1c 6.5 or greater.   Chronic systolic heart failure NYHA II- SOB with walking up hill. Weight and edema stable. Not requiring lasix. Continue carvedilol, losartan, cardiology follow up.   Essential hypertension Well controlled on carvedilol and losartan-continue.   Hyperlipidemia Well controlled on simvastatin 40mg -continue given LDL <70 in 11/2013  6 month follow up on diabetes with repeat a1c.

## 2014-01-19 NOTE — Assessment & Plan Note (Signed)
NYHA II- SOB with walking up hill. Weight and edema stable. Not requiring lasix. Continue carvedilol, losartan, cardiology follow up.

## 2014-01-19 NOTE — Progress Notes (Signed)
Pre visit review using our clinic review tool, if applicable. No additional management support is needed unless otherwise documented below in the visit note. 

## 2014-01-19 NOTE — Assessment & Plan Note (Signed)
Well controlled on carvedilol and losartan-continue.

## 2014-01-19 NOTE — Assessment & Plan Note (Signed)
Well controlled on simvastatin 40mg -continue given LDL <70 in 11/2013

## 2014-02-24 ENCOUNTER — Other Ambulatory Visit: Payer: Self-pay

## 2014-02-24 ENCOUNTER — Telehealth: Payer: Self-pay | Admitting: *Deleted

## 2014-02-24 ENCOUNTER — Other Ambulatory Visit: Payer: Self-pay | Admitting: Internal Medicine

## 2014-02-24 DIAGNOSIS — I5022 Chronic systolic (congestive) heart failure: Secondary | ICD-10-CM

## 2014-02-24 MED ORDER — CARVEDILOL 25 MG PO TABS: 25.0000 mg | ORAL_TABLET | Freq: Two times a day (BID) | ORAL | Status: DC

## 2014-02-24 NOTE — Telephone Encounter (Signed)
[place] pt. 

## 2014-02-24 NOTE — Telephone Encounter (Signed)
PT NEEDS CARVEDILOL CALLED IN TO [facility] NOT [facility] [phone]

## 2014-03-09 ENCOUNTER — Ambulatory Visit (INDEPENDENT_AMBULATORY_CARE_PROVIDER_SITE_OTHER): Payer: Medicare Other | Admitting: *Deleted

## 2014-03-09 DIAGNOSIS — I5022 Chronic systolic (congestive) heart failure: Secondary | ICD-10-CM

## 2014-03-09 DIAGNOSIS — I255 Ischemic cardiomyopathy: Secondary | ICD-10-CM

## 2014-03-09 NOTE — Progress Notes (Signed)
Remote ICD transmission.   

## 2014-03-10 LAB — MDC_IDC_ENUM_SESS_TYPE_REMOTE
Battery Remaining Longevity: 134 mo
Battery Voltage: 3.04 V
Date Time Interrogation Session: 20160128162157
HIGH POWER IMPEDANCE MEASURED VALUE: 73 Ohm
Lead Channel Impedance Value: 513 Ohm
Lead Channel Sensing Intrinsic Amplitude: 14.625 mV
Lead Channel Setting Pacing Pulse Width: 0.4 ms
Lead Channel Setting Sensing Sensitivity: 0.3 mV
MDC IDC MSMT LEADCHNL RV IMPEDANCE VALUE: 418 Ohm
MDC IDC MSMT LEADCHNL RV PACING THRESHOLD AMPLITUDE: 0.5 V
MDC IDC MSMT LEADCHNL RV PACING THRESHOLD PULSEWIDTH: 0.4 ms
MDC IDC MSMT LEADCHNL RV SENSING INTR AMPL: 14.625 mV
MDC IDC SET LEADCHNL RV PACING AMPLITUDE: 2 V
MDC IDC SET ZONE DETECTION INTERVAL: 350 ms
MDC IDC STAT BRADY RV PERCENT PACED: 0.01 %
Zone Setting Detection Interval: 360 ms
Zone Setting Detection Interval: 450 ms

## 2014-03-15 ENCOUNTER — Encounter: Payer: Self-pay | Admitting: Cardiology

## 2014-04-01 ENCOUNTER — Encounter: Payer: Self-pay | Admitting: Gastroenterology

## 2014-05-01 ENCOUNTER — Encounter: Payer: Self-pay | Admitting: Internal Medicine

## 2014-06-27 ENCOUNTER — Encounter: Payer: Self-pay | Admitting: Internal Medicine

## 2014-06-27 ENCOUNTER — Ambulatory Visit (INDEPENDENT_AMBULATORY_CARE_PROVIDER_SITE_OTHER): Payer: Medicare Other | Admitting: Internal Medicine

## 2014-06-27 VITALS — BP 136/83 | HR 71 | Ht 71.0 in | Wt 247.0 lb

## 2014-06-27 DIAGNOSIS — I255 Ischemic cardiomyopathy: Secondary | ICD-10-CM | POA: Diagnosis not present

## 2014-06-27 DIAGNOSIS — I5022 Chronic systolic (congestive) heart failure: Secondary | ICD-10-CM | POA: Diagnosis not present

## 2014-06-27 DIAGNOSIS — E785 Hyperlipidemia, unspecified: Secondary | ICD-10-CM | POA: Diagnosis not present

## 2014-06-27 LAB — CUP PACEART INCLINIC DEVICE CHECK
Battery Remaining Longevity: 133 mo
Date Time Interrogation Session: 20160517111617
HIGH POWER IMPEDANCE MEASURED VALUE: 64 Ohm
HighPow Impedance: 171 Ohm
Lead Channel Impedance Value: 513 Ohm
Lead Channel Pacing Threshold Amplitude: 0.5 V
Lead Channel Pacing Threshold Pulse Width: 0.4 ms
Lead Channel Setting Pacing Amplitude: 2 V
Lead Channel Setting Pacing Pulse Width: 0.4 ms
Lead Channel Setting Sensing Sensitivity: 0.3 mV
MDC IDC MSMT BATTERY VOLTAGE: 3.04 V
MDC IDC MSMT LEADCHNL RV SENSING INTR AMPL: 15.25 mV
MDC IDC STAT BRADY RV PERCENT PACED: 0.02 %
Zone Setting Detection Interval: 350 ms
Zone Setting Detection Interval: 360 ms
Zone Setting Detection Interval: 450 ms

## 2014-06-27 MED ORDER — SIMVASTATIN 40 MG PO TABS: 20.0000 mg | ORAL_TABLET | Freq: Every day | ORAL | Status: DC

## 2014-06-27 MED ORDER — FUROSEMIDE 40 MG PO TABS: 40.0000 mg | ORAL_TABLET | Freq: Every day | ORAL | Status: DC

## 2014-06-27 MED ORDER — POTASSIUM CHLORIDE CRYS ER 20 MEQ PO TBCR: 20.0000 meq | EXTENDED_RELEASE_TABLET | Freq: Every day | ORAL | Status: DC

## 2014-06-27 MED ORDER — CARVEDILOL 25 MG PO TABS: 12.5000 mg | ORAL_TABLET | Freq: Two times a day (BID) | ORAL | Status: DC

## 2014-06-27 NOTE — Assessment & Plan Note (Signed)
His blood pressure has been running on the low side. He will reduce his meds.

## 2014-06-27 NOTE — Patient Instructions (Signed)
Medication Instructions:  Your physician has recommended you make the following change in your medication:  1) Decrease Carvedilol to 12.5 mg twice daily 2) Decrease Zocor to 20 mg daily 3) Start Furosemide 40 mg daily 4) Start Potassium 20 meq daily    Labwork: Your physician recommends that you return for lab work in: 1 week for a BMP   Testing/Procedures: None ordered  Follow-Up: Remote monitoring is used to monitor your Pacemaker or ICD from home. This monitoring reduces the number of office visits required to check your device to one time per year. It allows [place] to keep an eye on the functioning of your device to ensure it is working properly. You are scheduled for a device check from home on 09/26/14. You may send your transmission at any time that day. If you have a wireless device, the transmission will be sent automatically. After your physician reviews your transmission, you will receive a postcard with your next transmission date.   Your physician wants you to follow-up in: 12 months with Dr Knox Saliva will receive a reminder letter in the mail two months in advance. If you don't receive a letter, please call our office to schedule the follow-up appointment.   Any Other Special Instructions Will Be Listed Below (If Applicable).

## 2014-06-27 NOTE — Assessment & Plan Note (Signed)
His medtronic ICD is working normally. He will recheck in several months.

## 2014-06-27 NOTE — Progress Notes (Signed)
HPI Joseph Malone returns today for followup. He is a very pleasant 81 year old man with an ischemic cardiomyopathy, chronic systolic heart failure, status post ICD implantation. He is s/p RV lead revision over a year ago.  He notes that he has developed sob and fatigue and his blood pressure is at times low, under a hundred systolic. He denies sodium indiscretion.  Allergies  Allergen Reactions  . Penicillins     Rash Because of a history of documented adverse serious drug reaction;Medi Alert bracelet  is recommended     Current Outpatient Prescriptions  Medication Sig Dispense Refill  . acetaminophen (TYLENOL) 500 MG tablet Take 2 tablets by mouth at bedtime    . aspirin 81 MG tablet Take 1 tablet (81 mg total) by mouth every evening.    . carvedilol (COREG) 25 MG tablet Take 1 tablet (25 mg total) by mouth 2 (two) times daily. 180 tablet 3  . Cholecalciferol (VITAMIN D3) 2000 UNITS TABS Take 2,000 Units by mouth every evening.     . Flaxseed, Linseed, (FLAX SEED OIL) 1000 MG CAPS Take 1,000 mg by mouth every evening.     . folic acid (FOLVITE) 761 MCG tablet Take 400 mcg by mouth daily.      . Glucosamine HCl 1500 MG TABS Take 1,500 mg by mouth every evening.     Marland Kitchen losartan (COZAAR) 50 MG tablet Take 1 tablet (50 mg total) by mouth at bedtime. 90 tablet 3  . Multiple Vitamin (MULTIVITAMIN WITH MINERALS) TABS Take 1 tablet by mouth daily.     . sildenafil (VIAGRA) 100 MG tablet Take 1 tablet (100 mg total) by mouth as needed for erectile dysfunction. 10 tablet 3  . simvastatin (ZOCOR) 40 MG tablet Take 1 tablet (40 mg total) by mouth at bedtime. 90 tablet 3   No current facility-administered medications for this visit.     Past Medical History  Diagnosis Date  . CAD (coronary artery disease)     s/p CABG  . Heart attack 2001  . Heart failure     CHF due to ischemic CM  . Ischemic cardiomyopathy   . Excess weight   . Hypertension   . Hyperlipidemia   . Fasting hyperglycemia   .  BPH (benign prostatic hypertrophy)   . Microscopic hematuria     [facility]  . Diverticulosis   . Diverticulosis of colon without hemorrhage 10/22/2007    Qualifier: Diagnosis of  By: Linna Darner MD, Etter Sjogren:   All systems reviewed and negative except as noted in the HPI.   Past Surgical History  Procedure Laterality Date  . Coronary artery bypass graft  2000    1 vessel  . Appendectomy    . Pilonidal cyst excision    . Cataract extraction  2008    bilateral with lens implant  . Colonoscopy  2004    Tics; [facility]  . Transthoracic echocardiogram  08/29/2005  . Cardiac defibrillator placement  2005    Medtronic; s/p ICD gen change and RV lead revision April 2015  . Cardiac catheterization  2000  . Cystoscopy  06/2012    Dr [name]  . Implantable cardioverter defibrillator (icd) generator change N/A 06/01/2013    Procedure: ICD GENERATOR CHANGE;  Surgeon: [name], MD;  Location[facility] CATH LAB;  Service: Cardiovascular;  Laterality: N/A;  . Lead revision N/A 06/01/2013    Procedure: LEAD REVISION;  Surgeon: [name], MD;  Location: [facility] CATH  LAB;  Service: Cardiovascular;  Laterality: N/A;     Family History  Problem Relation Age of Onset  . Hypertension Father   . Heart attack Father 20  . Diabetes Brother     X 2  . Heart attack Brother 84  . Heart attack Paternal Uncle 16  . Sudden death Paternal Grandfather 68    ? heat stroke  . Lung cancer Sister     "Asian lung cancer"  . Stroke Neg Hx      History   Social History  . Marital Status: Married    Spouse Name: N/A  . Number of Children: N/A  . Years of Education: N/A   Occupational History  . Not on file.   Social History Main Topics  . Smoking status: Passive Smoke Exposure - Never Smoker  . Smokeless tobacco: Never Used  . Alcohol Use: No  . Drug Use: No  . Sexual Activity: No   Other Topics Concern  . Not on file   Social History Narrative   Married 1957. 4 children 2 boys  2 girls. 15 grandkids.  4 greatgrandkids.       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 Retired from [employer] and worked 30 years.       Hobbies: genealogy, travel      No HCPOA-advised to do this.      BP 136/83 mmHg  Pulse 71  Ht 5\' 11"  (1.803 m)  Wt 247 lb (112.038 kg)  BMI 34.46 kg/m2  Physical Exam:  stable appearing 81 year old man, NAD HEENT: Unremarkable Neck:  7 cm JVD, no thyromegally Lungs:  Clear with no wheezes, rales, or rhonchi. HEART:  Regular rate rhythm, no murmurs, no rubs, no clicks Abd:  soft, obese, positive bowel sounds, no organomegally, no rebound, no guarding Ext:  2 plus pulses, no edema, no cyanosis, no clubbing Skin:  No rashes no nodules Neuro:  CN II through XII intact, motor grossly intact  DEVICE  Normal device function.  See PaceArt for details.   Assess/Plan:

## 2014-06-27 NOTE — Assessment & Plan Note (Signed)
His symptoms have worsened and he c/o both sob and fatigue. I have asked that the patient reduce his coreg, add lasix and potassium and return in a week for blood work. He will followup with Dr. Reine Just.

## 2014-06-27 NOTE — Assessment & Plan Note (Signed)
I am concerned that some of his symptoms could be related to his zocor. He will reduce his dose by half.

## 2014-07-11 ENCOUNTER — Other Ambulatory Visit (INDEPENDENT_AMBULATORY_CARE_PROVIDER_SITE_OTHER): Payer: Medicare Other | Admitting: *Deleted

## 2014-07-11 DIAGNOSIS — I5022 Chronic systolic (congestive) heart failure: Secondary | ICD-10-CM

## 2014-07-11 LAB — BASIC METABOLIC PANEL
BUN: 29 mg/dL — ABNORMAL HIGH (ref 6–23)
CHLORIDE: 104 meq/L (ref 96–112)
CO2: 25 meq/L (ref 19–32)
Calcium: 9.4 mg/dL (ref 8.4–10.5)
Creatinine, Ser: 1.28 mg/dL (ref 0.40–1.50)
GFR: 57.13 mL/min — ABNORMAL LOW (ref >60.00)
GLUCOSE: 162 mg/dL — AB (ref 70–99)
POTASSIUM: 4.1 meq/L (ref 3.5–5.1)
Sodium: 137 mEq/L (ref 135–145)

## 2014-08-07 ENCOUNTER — Other Ambulatory Visit: Payer: Self-pay

## 2014-08-21 ENCOUNTER — Ambulatory Visit (INDEPENDENT_AMBULATORY_CARE_PROVIDER_SITE_OTHER): Payer: Medicare Other | Admitting: Family Medicine

## 2014-08-21 ENCOUNTER — Ambulatory Visit: Payer: Medicare Other | Admitting: Family Medicine

## 2014-08-21 ENCOUNTER — Encounter: Payer: Self-pay | Admitting: Family Medicine

## 2014-08-21 VITALS — BP 128/82 | HR 81 | Temp 98.2°F | Wt 245.0 lb

## 2014-08-21 DIAGNOSIS — Z23 Encounter for immunization: Secondary | ICD-10-CM

## 2014-08-21 DIAGNOSIS — E1151 Type 2 diabetes mellitus with diabetic peripheral angiopathy without gangrene: Secondary | ICD-10-CM

## 2014-08-21 DIAGNOSIS — I5022 Chronic systolic (congestive) heart failure: Secondary | ICD-10-CM

## 2014-08-21 DIAGNOSIS — E785 Hyperlipidemia, unspecified: Secondary | ICD-10-CM

## 2014-08-21 DIAGNOSIS — E1159 Type 2 diabetes mellitus with other circulatory complications: Secondary | ICD-10-CM | POA: Diagnosis not present

## 2014-08-21 NOTE — Assessment & Plan Note (Signed)
S:115 cbg fasting. 98-130 range. No regular exercise but is active with Work around house Lab Results  Component Value Date   HGBA1C 6.5 11/17/2013  A/P: diet controlled last. Repeat a1c. Discussed trying to be as active as possible with his heart history without overdoing it.

## 2014-08-21 NOTE — Assessment & Plan Note (Signed)
S: Simvastatin 40mg --> 20mg  2016 by cardiology due to fatigue. Fatigue has improved- also started on lasix.  A/P: check direct LDL, hopeful <100 given age and comorbidities.

## 2014-08-21 NOTE — Progress Notes (Signed)
Garret Reddish, MD  Subjective:  Joseph Malone is a 81 y.o. year old very pleasant male patient who presents with:  See problem oriented charting ROS- SOB and fatigue with standing has resolved, still SOB with walking up hill or stairs. No chest pain. Denies myalgieas  Past Medical History- defibrillator and pacemaker in place, ischemic cardiomyopathy with CHF systolic EF 37-16%, CAD  Medications- reviewed and updated Current Outpatient Prescriptions  Medication Sig Dispense Refill  . aspirin 81 MG tablet Take 1 tablet (81 mg total) by mouth every evening.    . carvedilol (COREG) 25 MG tablet Take 0.5 tablets (12.5 mg total) by mouth 2 (two) times daily. 180 tablet 3  . Cholecalciferol (VITAMIN D3) 2000 UNITS TABS Take 2,000 Units by mouth every evening.     . Flaxseed, Linseed, (FLAX SEED OIL) 1000 MG CAPS Take 1,000 mg by mouth every evening.     . folic acid (FOLVITE) 967 MCG tablet Take 400 mcg by mouth daily.      . furosemide (LASIX) 40 MG tablet Take 1 tablet (40 mg total) by mouth daily. 90 tablet 3  . Glucosamine HCl 1500 MG TABS Take 1,500 mg by mouth every evening.     Marland Kitchen losartan (COZAAR) 50 MG tablet Take 1 tablet (50 mg total) by mouth at bedtime. 90 tablet 3  . Multiple Vitamin (MULTIVITAMIN WITH MINERALS) TABS Take 1 tablet by mouth daily.     . potassium chloride SA (K-DUR,KLOR-CON) 20 MEQ tablet Take 1 tablet (20 mEq total) by mouth daily. 90 tablet 3  . simvastatin (ZOCOR) 40 MG tablet Take 0.5 tablets (20 mg total) by mouth at bedtime. 90 tablet 3  . acetaminophen (TYLENOL) 500 MG tablet Take 2 tablets by mouth at bedtime    . sildenafil (VIAGRA) 100 MG tablet Take 1 tablet (100 mg total) by mouth as needed for erectile dysfunction. (Patient not taking: Reported on 08/21/2014) 10 tablet 3   Objective: BP 128/82 mmHg  Pulse 81  Temp(Src) 98.2 F (36.8 C)  Wt 245 lb (111.131 kg) Gen: NAD, resting comfortably CV: RRR no murmurs rubs or gallops Lungs: CTAB no  crackles, wheeze, rhonchi Abdomen: soft/nontender/nondistended/normal bowel sounds. Obese Ext: no edema Skin: warm, dry, no rash Neuro: grossly normal, moves all extremities, normal gait  Assessment/Plan:  DM (diabetes mellitus) type II controlled peripheral vascular disorder- controlled diet and exercise S:115 cbg fasting. 98-130 range. No regular exercise but is active with Work around house Lab Results  Component Value Date   HGBA1C 6.5 11/17/2013  A/P: diet controlled last. Repeat a1c. Discussed trying to be as active as possible with his heart history without overdoing it.    Hyperlipidemia- suspect controlled S: Simvastatin 40mg --> 20mg  2016 by cardiology due to fatigue. Fatigue has improved- also started on lasix.  A/P: check direct LDL, hopeful <100 given age and comorbidities.    Chronic systolic heart failure- improved control S: 5/17 increas SOB, fatigue noted by cards. Systolic CHF seen by cards- Reduce coreg, add lasix and potassium. Patient states did not notice a difference for several weeks then noted he had an easier time getting up and moving around the house on daily lasix A/P: NYHA Class II- back to difficulty with stairs/hills but does well around the house. Continue lower dose coreg, lasix, losartan   6 months or prn sooner  Orders Placed This Encounter  Procedures  . Pneumococcal conjugate vaccine 13-valent  . Hemoglobin A1c  . LDL Cholesterol, Direct

## 2014-08-21 NOTE — Patient Instructions (Signed)
Glad things are going well including the difficulty with standing  Let's check in on your bloodwork today-a1c and bad cholesterol level  Follow up in 6 months

## 2014-08-21 NOTE — Assessment & Plan Note (Signed)
S: 5/17 increas SOB, fatigue noted by cards. Systolic CHF seen by cards- Reduce coreg, add lasix and potassium. Patient states did not notice a difference for several weeks then noted he had an easier time getting up and moving around the house on daily lasix A/P: NYHA Class II- back to difficulty with stairs/hills but does well around the house. Continue lower dose coreg, lasix, losartan

## 2014-08-30 ENCOUNTER — Other Ambulatory Visit: Payer: Medicare Other

## 2014-08-30 LAB — LDL CHOLESTEROL, DIRECT: LDL DIRECT: 80 mg/dL

## 2014-08-30 LAB — HEMOGLOBIN A1C: Hgb A1c MFr Bld: 6.8 % — ABNORMAL HIGH (ref 4.6–6.5)

## 2014-09-26 ENCOUNTER — Ambulatory Visit (INDEPENDENT_AMBULATORY_CARE_PROVIDER_SITE_OTHER): Payer: Medicare Other | Admitting: *Deleted

## 2014-09-26 DIAGNOSIS — I255 Ischemic cardiomyopathy: Secondary | ICD-10-CM

## 2014-09-26 DIAGNOSIS — I5022 Chronic systolic (congestive) heart failure: Secondary | ICD-10-CM | POA: Diagnosis not present

## 2014-09-26 NOTE — Progress Notes (Signed)
Remote ICD transmission.   

## 2014-10-04 LAB — CUP PACEART REMOTE DEVICE CHECK
Battery Remaining Longevity: 131 mo
Brady Statistic RV Percent Paced: 0.02 %
Date Time Interrogation Session: 20160816092705
HIGH POWER IMPEDANCE MEASURED VALUE: 76 Ohm
Lead Channel Impedance Value: 456 Ohm
Lead Channel Impedance Value: 532 Ohm
Lead Channel Pacing Threshold Amplitude: 0.625 V
Lead Channel Pacing Threshold Pulse Width: 0.4 ms
Lead Channel Sensing Intrinsic Amplitude: 14.5 mV
Lead Channel Setting Pacing Amplitude: 2 V
Lead Channel Setting Sensing Sensitivity: 0.3 mV
MDC IDC MSMT BATTERY VOLTAGE: 3.02 V
MDC IDC MSMT LEADCHNL RV SENSING INTR AMPL: 14.5 mV
MDC IDC SET LEADCHNL RV PACING PULSEWIDTH: 0.4 ms
MDC IDC SET ZONE DETECTION INTERVAL: 360 ms
Zone Setting Detection Interval: 350 ms
Zone Setting Detection Interval: 450 ms

## 2014-10-11 ENCOUNTER — Encounter: Payer: Self-pay | Admitting: Cardiology

## 2014-10-17 ENCOUNTER — Encounter: Payer: Self-pay | Admitting: Internal Medicine

## 2014-10-25 ENCOUNTER — Encounter: Payer: Self-pay | Admitting: Cardiology

## 2014-12-05 ENCOUNTER — Telehealth: Payer: Self-pay | Admitting: Family Medicine

## 2014-12-05 NOTE — Telephone Encounter (Signed)
Pt has been scheduled.  °

## 2014-12-05 NOTE — Telephone Encounter (Signed)
Have pt schedule OV, per Dr. Yong Channel.

## 2014-12-05 NOTE — Telephone Encounter (Signed)
No appts this week. Please advise.

## 2014-12-05 NOTE — Telephone Encounter (Signed)
Pt states he had been dizzy yesterday, fell a couple of times, but today seems to be better. No more falling. Pt would like to see you or advise on what he should do from here.

## 2014-12-05 NOTE — Telephone Encounter (Signed)
Next available is fine.

## 2014-12-05 NOTE — Telephone Encounter (Signed)
Yes would advise visit

## 2014-12-05 NOTE — Telephone Encounter (Signed)
Ov needed 

## 2014-12-08 ENCOUNTER — Ambulatory Visit (INDEPENDENT_AMBULATORY_CARE_PROVIDER_SITE_OTHER): Payer: Medicare Other | Admitting: Family Medicine

## 2014-12-08 ENCOUNTER — Encounter: Payer: Self-pay | Admitting: Family Medicine

## 2014-12-08 VITALS — BP 130/80 | HR 76 | Temp 98.4°F | Wt 245.0 lb

## 2014-12-08 DIAGNOSIS — W19XXXA Unspecified fall, initial encounter: Secondary | ICD-10-CM

## 2014-12-08 DIAGNOSIS — R531 Weakness: Secondary | ICD-10-CM

## 2014-12-08 NOTE — Patient Instructions (Signed)
I am going to contact cardiology to do 2 things: 1. See if you had any heart rhythm issues. 2. See if they would like to see you.   You had a normal neurological exam- doubt something like stroke.  Also strongly doubt seizure.  Opted not to pursue MRI at this time unless recurrent.   If this happens again- I ask you to seek care immediately. Also, check your sugar to make sure it is not low  Continue to avoid tylenol PM. My strongest opinion is it was likely a combination of the tylenol PM, dehydration, and your chronic medical illness congestive heart failure.   We will need to embark on much more extensive investigation if this happens again

## 2014-12-08 NOTE — Progress Notes (Signed)
Garret Reddish, MD  Subjective:  Joseph Malone is a 81 y.o. year old very pleasant male patient who presents for/with See problem oriented charting ROS- No chest pain or shortness of breath. No headache or blurry vision. Did not hit head hard- softly landed on carpet.   Past Medical History-  Patient Active Problem List   Diagnosis Date Noted  . Chronic systolic heart failure ([place]) 12/09/2010    Priority: High  . Cardiomyopathy, ischemic 10/02/2009    Priority: High  . Automatic implantable cardioverter-defibrillator in situ 01/16/2009    Priority: High  . DM (diabetes mellitus) type II controlled peripheral vascular disorder ([place]) 09/14/2008    Priority: High  . CAD in native artery 09/25/2006    Priority: High  . Hyperlipidemia 04/22/2007    Priority: Medium  . Essential hypertension 09/25/2006    Priority: Medium  . Erectile dysfunction 01/19/2014    Priority: Low  . Basal cell cancer 09/29/2012    Priority: Low  . Solitary pulmonary nodule 07/12/2012    Priority: Low  . BPH without obstruction/lower urinary tract symptoms 10/22/2007    Priority: Low  . Osteoarthritis 09/25/2006    Priority: Low  . Hematuria 09/25/2006    Priority: Low    Medications- reviewed and updated Current Outpatient Prescriptions  Medication Sig Dispense Refill  . aspirin 81 MG tablet Take 1 tablet (81 mg total) by mouth every evening.    . carvedilol (COREG) 25 MG tablet Take 0.5 tablets (12.5 mg total) by mouth 2 (two) times daily. 180 tablet 3  . Cholecalciferol (VITAMIN D3) 2000 UNITS TABS Take 2,000 Units by mouth every evening.     . Flaxseed, Linseed, (FLAX SEED OIL) 1000 MG CAPS Take 1,000 mg by mouth every evening.     . folic acid (FOLVITE) 268 MCG tablet Take 400 mcg by mouth daily.      . furosemide (LASIX) 40 MG tablet Take 1 tablet (40 mg total) by mouth daily. 90 tablet 3  . Glucosamine HCl 1500 MG TABS Take 1,500 mg by mouth every evening.     Marland Kitchen losartan (COZAAR) 50 MG tablet  Take 1 tablet (50 mg total) by mouth at bedtime. 90 tablet 3  . Multiple Vitamin (MULTIVITAMIN WITH MINERALS) TABS Take 1 tablet by mouth daily.     . potassium chloride SA (K-DUR,KLOR-CON) 20 MEQ tablet Take 1 tablet (20 mEq total) by mouth daily. 90 tablet 3  . simvastatin (ZOCOR) 40 MG tablet Take 0.5 tablets (20 mg total) by mouth at bedtime. 90 tablet 3  . acetaminophen (TYLENOL) 500 MG tablet Take 2 tablets by mouth at bedtime    . sildenafil (VIAGRA) 100 MG tablet Take 1 tablet (100 mg total) by mouth as needed for erectile dysfunction. (Patient not taking: Reported on 08/21/2014) 10 tablet 3   No current facility-administered medications for this visit.    Objective: BP 130/80 mmHg  Pulse 76  Temp(Src) 98.4 F (36.9 C)  Wt 245 lb (111.131 kg) Gen: NAD, resting comfortably CV: RRR no murmurs rubs or gallops Lungs: CTAB no crackles, wheeze, rhonchi Abdomen: soft/nontender/nondistended/normal bowel sounds. No rebound or guarding.  Ext: no edema Skin: warm, dry Neuro: CN II-XII intact, sensation and reflexes normal throughout, 5/5 muscle strength in bilateral upper and lower extremities. Normal finger to nose. Normal rapid alternating movements. Normal gait.     Assessment/Plan:  Generalized Weakness, mild confusion and Fall x 2  S:Went out to eat Monday- was really thirsty and drank 16  oz water then drank another 16 oz. Cannot remember if his fluid intake was light earlier that day but he suspects it was. He states he was Really thirsty. Did not check CBG but earlier had been <30. Usually takes a tylenol and tylenol PM before bed. Took 2 tylenol PM that night. He has done that before but states usually feels weaker in the AM, gets better as day goes on.   Got up to go to bathroom at night. Fell down to knees then over onto face (but slowly as caught himself with arms for brunt of fall). Could not get arms underneath him to get him up. Could not get up on own- needed wife's assist.  Felt very week. Went to bathroom fine after he got back to his feet.  A few hours got back to bathroom and when reaching for toilet paper, foot slipped on a wet spot and he fell forward onto the floor again. Was really sore the next 2 days. Never passed out with either event. Thinks he felt lightheaded, but moreso felt weak. No headache, or blurry vision with event. No chest pain, shortness of breath, palpitations with event. Did not check CBG at time of event but next morning it was around 150 and he hadn't eaten anything. Sugars have been running around 115-130 and patient does nto take any medicine for his diabetes which is diet controlled with last a1c 6.8. Weight has been stable since last visit and denies edema.  Patient does have a pacemaker/defib and he believes it transmits when he is 15 feet from monitor so he thinks the time of this event is available to cardiology but he is not sure. He has had no further falls, weakness since that time. Denies orthostatic symptoms and Bp controlled today.   The next morning he felt back to his normal self with no issues and has not had recurrence.   A/P: 81 year old male with diabetes, systolic CHF with defib/pacemaker in place who had Generalized Weakness, mild confusion and Fall x 2  Of unclear etiology  Leading differential: falls and generalized weakness due to tylenol PM x 2 in combination with mild dehydration with baseline systolic heart failure. Permanently discontinue tylenol PM  Will reach out to cardiology to see if they can look at 48 hours from Monday through Tuesday 10/24 and 10/25 to see if any arhythmia.   Patient appears euvolemic but will see if CHF clinic wants to have patient in for evaluation  Initially considered presyncope workup but that was not most prominent symptom. Today, normal neurological exam- doubt stroke/TIA but possible. Also strongly doubt seizure. Opted not to pursue MRI at this time unless recurrent. Also will not  pursue echo, carotid dopplers unless cards suggests or recurrent. Doubt hypoglycemia as not on any diabetic medications though asked patient to check CBG if recurs. High risk patient  Emergent Return precautions advised.

## 2014-12-12 ENCOUNTER — Telehealth: Payer: Self-pay | Admitting: Cardiology

## 2014-12-12 NOTE — Telephone Encounter (Signed)
Spoke w/ pt and asked him to send a manual transmission w/ his home monitor. Pt verbalized understanding and stated that he would send the transmission now.

## 2014-12-12 NOTE — Telephone Encounter (Signed)
-----   Message from Marin Olp, MD sent at 12/08/2014  8:25 PM EDT ----- Dr. Haroldine Laws,  Patient had 2 falls Monday evening. I think they may have been related to taking 2 tylenol PM BUT I wanted to reach out to see if his pacemaker/defib could be evaluated from 10/24 and 10/25. I also included Garlon Hatchet who appeared to work with last transmission. In addition, wanted to see if you could review note and if you would like to see patient in office for evaluation.  Thanks Garret Reddish, PCP

## 2014-12-28 ENCOUNTER — Ambulatory Visit (INDEPENDENT_AMBULATORY_CARE_PROVIDER_SITE_OTHER): Payer: Medicare Other | Admitting: *Deleted

## 2014-12-28 DIAGNOSIS — I5022 Chronic systolic (congestive) heart failure: Secondary | ICD-10-CM | POA: Diagnosis not present

## 2014-12-28 DIAGNOSIS — I255 Ischemic cardiomyopathy: Secondary | ICD-10-CM

## 2014-12-28 NOTE — Progress Notes (Signed)
Remote ICD transmission.   

## 2015-01-03 LAB — CUP PACEART REMOTE DEVICE CHECK
Battery Remaining Longevity: 129 mo
Battery Voltage: 3.02 V
Date Time Interrogation Session: 20161117083524
HighPow Impedance: 68 Ohm
Implantable Lead Implant Date: 20150422
Implantable Lead Location: 753860
Implantable Lead Model: 6935
Lead Channel Pacing Threshold Amplitude: 0.5 V
Lead Channel Pacing Threshold Pulse Width: 0.4 ms
Lead Channel Sensing Intrinsic Amplitude: 14.75 mV
Lead Channel Sensing Intrinsic Amplitude: 14.75 mV
Lead Channel Setting Pacing Amplitude: 2 V
Lead Channel Setting Pacing Pulse Width: 0.4 ms
Lead Channel Setting Sensing Sensitivity: 0.3 mV
MDC IDC MSMT LEADCHNL RV IMPEDANCE VALUE: 418 Ohm
MDC IDC MSMT LEADCHNL RV IMPEDANCE VALUE: 475 Ohm
MDC IDC STAT BRADY RV PERCENT PACED: 0.01 %

## 2015-01-10 ENCOUNTER — Encounter: Payer: Self-pay | Admitting: Cardiology

## 2015-01-11 ENCOUNTER — Encounter (HOSPITAL_COMMUNITY): Payer: Self-pay | Admitting: Internal Medicine

## 2015-01-11 ENCOUNTER — Ambulatory Visit (HOSPITAL_COMMUNITY)
Admission: RE | Admit: 2015-01-11 | Discharge: 2015-01-11 | Disposition: A | Discharge status: Home / Self Care | Payer: Medicare Other | Source: Ambulatory Visit | Attending: Internal Medicine | Admitting: Internal Medicine

## 2015-01-11 VITALS — BP 138/74 | HR 81 | Wt 248.8 lb

## 2015-01-11 DIAGNOSIS — I110 Hypertensive heart disease with heart failure: Secondary | ICD-10-CM | POA: Diagnosis not present

## 2015-01-11 DIAGNOSIS — Z951 Presence of aortocoronary bypass graft: Secondary | ICD-10-CM | POA: Diagnosis not present

## 2015-01-11 DIAGNOSIS — I2510 Atherosclerotic heart disease of native coronary artery without angina pectoris: Secondary | ICD-10-CM

## 2015-01-11 DIAGNOSIS — I10 Essential (primary) hypertension: Secondary | ICD-10-CM | POA: Diagnosis not present

## 2015-01-11 DIAGNOSIS — E785 Hyperlipidemia, unspecified: Secondary | ICD-10-CM | POA: Diagnosis not present

## 2015-01-11 DIAGNOSIS — I5022 Chronic systolic (congestive) heart failure: Secondary | ICD-10-CM | POA: Diagnosis not present

## 2015-01-11 LAB — BASIC METABOLIC PANEL
Anion gap: 9 (ref 5–15)
BUN: 24 mg/dL — AB (ref 6–20)
CALCIUM: 9.4 mg/dL (ref 8.9–10.3)
CO2: 24 mmol/L (ref 22–32)
CREATININE: 1.3 mg/dL — AB (ref 0.61–1.24)
Chloride: 107 mmol/L (ref 101–111)
GFR calc Af Amer: 57 mL/min — ABNORMAL LOW (ref >60)
GFR calc non Af Amer: 49 mL/min — ABNORMAL LOW (ref >60)
GLUCOSE: 231 mg/dL — AB (ref 65–99)
Potassium: 3.9 mmol/L (ref 3.5–5.1)
Sodium: 140 mmol/L (ref 135–145)

## 2015-01-11 LAB — TSH: TSH: 0.432 u[IU]/mL (ref 0.350–4.500)

## 2015-01-11 LAB — CBC
HCT: 42.4 % (ref 39.0–52.0)
Hemoglobin: 14.2 g/dL (ref 13.0–17.0)
MCH: 31.4 pg (ref 26.0–34.0)
MCHC: 33.5 g/dL (ref 30.0–36.0)
MCV: 93.8 fL (ref 78.0–100.0)
PLATELETS: 170 10*3/uL (ref 150–400)
RBC: 4.52 MIL/uL (ref 4.22–5.81)
RDW: 12.7 % (ref 11.5–15.5)
WBC: 6.5 10*3/uL (ref 4.0–10.5)

## 2015-01-11 NOTE — Patient Instructions (Signed)
Routine lab work today. Will notify you of abnormal results  FOLLOW UP: 4 months (Dr.Bensimhon)

## 2015-01-11 NOTE — Progress Notes (Signed)
CARDIOLOGY CLINIC NOTE  Patient ID: Joseph Malone, male   DOB: 1930-08-14, 81 y.o.   MRN: [id number] HPI: Joseph Malone  is a pleasant 81 year old male with a history of coronary artery disease status post large anterior MI with coronary  bypass grafting in 2001 with a LIMA to the LAD.  He has history of  congestive heart failure secondary to resultant ischemic cardiomyopathy,  ejection fraction however is in the 35-40% (08/2005).  He is status post  single-chamber ICD.  He did have a nuclear study in March 01, 2006, which showed an EF of 38% a large anterior infarction, but no ischemia. ETT 11/2009 was normal (8:57min on [facility])  Echo 12/14 EF 30-35% + ICD (followed by Dr. Lovena Le) - ICD gen chang 06/01/13  The rest of his medical history is notable for obesity, hypertension,  hyperlipidemia, and glucose intolerance.   Yearly Follow up: Feels ok. But says he gets tired more easily. When he goes to the grocery store typically does ok but if has to walk farther gets more fatigued. No chest pain. Does have chest soreness which is chronic. Saw Dr. Lovena Le a few months ago and SBP < 100 + edema. Carvedilol cut back and lasix added. Denies palpitations, edema, dizziness or orthopnea. Weight up 2 lbs. Taking medications as prescribed. No shocks from ICD. SBP at home 120s after he takes his medications.   ICD interrogated personally: No VT/AF. Activity level 2 hours. Fluid ok.   Labs: (11/2012)L: K+ 4.1, Cr. 1.1, cholesterol 122, TG 137, HDL 43, LDL 51            (11/17/13)  TC 115, TG 101, HDL 37, LDL 58  HgbA1c 6.5  ROS: All systems negative except as listed in HPI, PMH and Problem List.  Past Medical History  Diagnosis Date  . CAD (coronary artery disease)     s/p CABG  . Heart attack ([place]) 2001  . Heart failure     CHF due to ischemic CM  . Ischemic cardiomyopathy   . Excess weight   . Hypertension   . Hyperlipidemia   . Fasting hyperglycemia   . BPH (benign prostatic hypertrophy)   . Microscopic  hematuria     [facility]  . Diverticulosis   . Diverticulosis of colon without hemorrhage 10/22/2007    Qualifier: Diagnosis of  By: Linna Darner MD, [name]      Current Outpatient Prescriptions  Medication Sig Dispense Refill  . acetaminophen (TYLENOL) 500 MG tablet Take 2 tablets by mouth at bedtime    . aspirin 81 MG tablet Take 1 tablet (81 mg total) by mouth every evening.    . carvedilol (COREG) 25 MG tablet Take 0.5 tablets (12.5 mg total) by mouth 2 (two) times daily. 180 tablet 3  . Cholecalciferol (VITAMIN D3) 2000 UNITS TABS Take 2,000 Units by mouth every evening.     . Flaxseed, Linseed, (FLAX SEED OIL) 1000 MG CAPS Take 1,000 mg by mouth every evening.     . folic acid (FOLVITE) A999333 MCG tablet Take 400 mcg by mouth daily.      . furosemide (LASIX) 40 MG tablet Take 1 tablet (40 mg total) by mouth daily. 90 tablet 3  . Glucosamine HCl 1500 MG TABS Take 1,500 mg by mouth every evening.     Marland Kitchen losartan (COZAAR) 50 MG tablet Take 50 mg by mouth daily.    . Multiple Vitamin (MULTIVITAMIN WITH MINERALS) TABS Take 1 tablet by mouth daily.     Marland Kitchen  sildenafil (VIAGRA) 100 MG tablet Take 1 tablet (100 mg total) by mouth as needed for erectile dysfunction. 10 tablet 3  . simvastatin (ZOCOR) 40 MG tablet Take 0.5 tablets (20 mg total) by mouth at bedtime. 90 tablet 3  . potassium chloride SA (K-DUR,KLOR-CON) 20 MEQ tablet Take 1 tablet (20 mEq total) by mouth daily. 90 tablet 3   No current facility-administered medications for this encounter.   Filed Vitals:   01/11/15 1011  BP: 138/74  Pulse: 81  Weight: 248 lb 12 oz (112.832 kg)  SpO2: 96%    PHYSICAL EXAM: General: Elderly appearing. No resp difficulty HEENT: normal; large knot on L side of forehead.  Neck: supple. JVP flat. Carotids 2+ bilaterally; no bruits. No lymphadenopathy or thryomegaly appreciated. Cor: PMI normal. Regular rate & rhythm. No rubs, gallops or murmurs. Lungs: clear Abdomen: soft, nontender,  nondistended. No hepatosplenomegaly. No bruits or masses. Good bowel sounds. Extremities: no cyanosis, clubbing, rash, edema Neuro: alert & orientedx3, cranial nerves grossly intact. Moves all 4 extremities w/o difficulty. Affect pleasant.   ECG: NSR 86 Previous anterior MI. LVH   ASSESSMENT & PLAN:  1) CAD: s/p CABG 2001 - No s/s of ischemia. Will continue medical management (BB, statin, and ASA) - Continue  ASA to 81 2) Chronic systolic HF: ICM, EF 99991111 (echo 2014) s/p ICD.  NYHA II-III symptoms.  - Volume status looks good on exam and by Optivol. Continue lasix 40 mg dail. Check labs today. If K low may consider transitioning over to spiro - Exercise capacity slightyl worse. Will refer to HF exercise program at [facility] continue coreg at 12.5 mg BID and losartan 50 mg daily. Unable to tolerate higher doses of coreg or losartan in past due to dizziness and fatigue. - Reinforced the need and importance of daily weights, a low sodium diet, and fluid restriction (less than 2 L a day). Instructed to call the HF clinic if weight increases more than 3 lbs overnight or 5 lbs in a week.   - Will see back in 3-4 months -Check labs today: BMET, CBC, TSH 3) HTN - has been low recently. Improve with cutting back carvedilol 4) HLD - managed by PCP. Continue statin  F/U 1 year Bensimhon, Daniel MD 10:40 AM

## 2015-01-18 NOTE — Telephone Encounter (Signed)
PT had a normal remote on 01-10-15 his my chart message informing him of his results has already been emailed to him. He also hand an appt w/ MD at [facility] on 01-11-15.

## 2015-02-06 ENCOUNTER — Other Ambulatory Visit: Payer: Self-pay | Admitting: Internal Medicine

## 2015-02-22 ENCOUNTER — Telehealth (HOSPITAL_COMMUNITY): Payer: Self-pay | Admitting: Vascular Surgery

## 2015-02-22 NOTE — Telephone Encounter (Signed)
Pt called he needs a refill Losartan and he would like someone to call him about some changes to some other medications.. Please advise

## 2015-02-26 ENCOUNTER — Other Ambulatory Visit (HOSPITAL_COMMUNITY): Payer: Self-pay | Admitting: Internal Medicine

## 2015-03-19 ENCOUNTER — Other Ambulatory Visit (HOSPITAL_COMMUNITY): Payer: Self-pay | Admitting: *Deleted

## 2015-03-19 DIAGNOSIS — I5022 Chronic systolic (congestive) heart failure: Secondary | ICD-10-CM

## 2015-03-19 MED ORDER — FUROSEMIDE 40 MG PO TABS: 40.0000 mg | ORAL_TABLET | Freq: Every day | ORAL | Status: DC

## 2015-03-19 MED ORDER — CARVEDILOL 25 MG PO TABS: 12.5000 mg | ORAL_TABLET | Freq: Two times a day (BID) | ORAL | Status: DC

## 2015-03-29 ENCOUNTER — Ambulatory Visit (INDEPENDENT_AMBULATORY_CARE_PROVIDER_SITE_OTHER): Payer: Medicare Other | Admitting: *Deleted

## 2015-03-29 DIAGNOSIS — I5022 Chronic systolic (congestive) heart failure: Secondary | ICD-10-CM

## 2015-03-29 DIAGNOSIS — I255 Ischemic cardiomyopathy: Secondary | ICD-10-CM

## 2015-03-29 NOTE — Progress Notes (Signed)
Remote ICD transmission.   

## 2015-04-15 LAB — CUP PACEART REMOTE DEVICE CHECK
Battery Voltage: 3.02 V
Brady Statistic RV Percent Paced: 0.01 %
HighPow Impedance: 70 Ohm
Implantable Lead Location: 753860
Implantable Lead Model: 6935
Lead Channel Impedance Value: 418 Ohm
Lead Channel Impedance Value: 513 Ohm
Lead Channel Sensing Intrinsic Amplitude: 14.125 mV
Lead Channel Setting Pacing Amplitude: 2 V
Lead Channel Setting Sensing Sensitivity: 0.3 mV
MDC IDC LEAD IMPLANT DT: 20150422
MDC IDC MSMT BATTERY REMAINING LONGEVITY: 127 mo
MDC IDC MSMT LEADCHNL RV PACING THRESHOLD AMPLITUDE: 0.625 V
MDC IDC MSMT LEADCHNL RV PACING THRESHOLD PULSEWIDTH: 0.4 ms
MDC IDC MSMT LEADCHNL RV SENSING INTR AMPL: 14.125 mV
MDC IDC SESS DTM: 20170216081604
MDC IDC SET LEADCHNL RV PACING PULSEWIDTH: 0.4 ms

## 2015-04-15 NOTE — Progress Notes (Signed)
Normal remote reviewed.  Next follow up 06/2015 in clinic.  

## 2015-04-25 ENCOUNTER — Encounter: Payer: Self-pay | Admitting: Cardiology

## 2015-05-09 ENCOUNTER — Encounter: Payer: Self-pay | Admitting: Cardiology

## 2015-05-16 DIAGNOSIS — R3129 Other microscopic hematuria: Secondary | ICD-10-CM | POA: Diagnosis not present

## 2015-05-16 DIAGNOSIS — R310 Gross hematuria: Secondary | ICD-10-CM | POA: Diagnosis not present

## 2015-05-16 DIAGNOSIS — N200 Calculus of kidney: Secondary | ICD-10-CM | POA: Diagnosis not present

## 2015-05-18 ENCOUNTER — Other Ambulatory Visit (HOSPITAL_COMMUNITY): Payer: Self-pay | Admitting: *Deleted

## 2015-05-18 DIAGNOSIS — I5022 Chronic systolic (congestive) heart failure: Secondary | ICD-10-CM

## 2015-05-18 DIAGNOSIS — E785 Hyperlipidemia, unspecified: Secondary | ICD-10-CM

## 2015-05-18 MED ORDER — CARVEDILOL 25 MG PO TABS: 12.5000 mg | ORAL_TABLET | Freq: Two times a day (BID) | ORAL | Status: DC

## 2015-05-18 MED ORDER — SIMVASTATIN 40 MG PO TABS: 20.0000 mg | ORAL_TABLET | Freq: Every day | ORAL | Status: DC

## 2015-05-18 MED ORDER — LOSARTAN POTASSIUM 50 MG PO TABS: ORAL_TABLET | ORAL | Status: DC

## 2015-05-30 ENCOUNTER — Ambulatory Visit (HOSPITAL_COMMUNITY)
Admission: RE | Admit: 2015-05-30 | Discharge: 2015-05-30 | Disposition: A | Discharge status: Home / Self Care | Payer: Medicare Other | Source: Ambulatory Visit | Attending: Internal Medicine | Admitting: Internal Medicine

## 2015-05-30 VITALS — BP 122/70 | HR 90 | Wt 247.8 lb

## 2015-05-30 DIAGNOSIS — Z79899 Other long term (current) drug therapy: Secondary | ICD-10-CM | POA: Diagnosis not present

## 2015-05-30 DIAGNOSIS — I255 Ischemic cardiomyopathy: Secondary | ICD-10-CM | POA: Diagnosis not present

## 2015-05-30 DIAGNOSIS — E669 Obesity, unspecified: Secondary | ICD-10-CM | POA: Insufficient documentation

## 2015-05-30 DIAGNOSIS — E785 Hyperlipidemia, unspecified: Secondary | ICD-10-CM | POA: Insufficient documentation

## 2015-05-30 DIAGNOSIS — I2510 Atherosclerotic heart disease of native coronary artery without angina pectoris: Secondary | ICD-10-CM | POA: Diagnosis not present

## 2015-05-30 DIAGNOSIS — I5022 Chronic systolic (congestive) heart failure: Secondary | ICD-10-CM | POA: Insufficient documentation

## 2015-05-30 DIAGNOSIS — Z7982 Long term (current) use of aspirin: Secondary | ICD-10-CM | POA: Insufficient documentation

## 2015-05-30 DIAGNOSIS — Z95810 Presence of automatic (implantable) cardiac defibrillator: Secondary | ICD-10-CM | POA: Insufficient documentation

## 2015-05-30 DIAGNOSIS — Z951 Presence of aortocoronary bypass graft: Secondary | ICD-10-CM | POA: Diagnosis not present

## 2015-05-30 DIAGNOSIS — I110 Hypertensive heart disease with heart failure: Secondary | ICD-10-CM | POA: Diagnosis not present

## 2015-05-30 DIAGNOSIS — I252 Old myocardial infarction: Secondary | ICD-10-CM | POA: Insufficient documentation

## 2015-05-30 NOTE — Patient Instructions (Signed)
Your physician has requested that you have an echocardiogram. Echocardiography is a painless test that uses sound waves to create images of your heart. It provides your doctor with information about the size and shape of your heart and how well your heart's chambers and valves are working. This procedure takes approximately one hour. There are no restrictions for this procedure.  We will contact you in 6 months to schedule your next appointment.  

## 2015-05-30 NOTE — Progress Notes (Signed)
Patient ID: Joseph Malone, male   DOB: 25-Apr-1930, 82 y.o.   MRN: [id number]  CARDIOLOGY CLINIC NOTE PCP: Joseph Malone  Patient ID: Joseph Malone, male   DOB: 12-08-1930, 82 y.o.   MRN: [id number] HPI: Joseph Malone  is a pleasant 82 year old male with a history of coronary artery disease status post large anterior MI with coronary  bypass grafting in 2001 with a LIMA to the LAD.  He has history of  congestive heart failure secondary to resultant ischemic cardiomyopathy,  ejection fraction however is in the 35-40% (08/2005).  He is status post  single-chamber ICD.  He did have a nuclear study in March 01, 2006, which showed an EF of 38% a large anterior infarction, but no ischemia. ETT 11/2009 was normal (8:5min on [facility])  Echo 12/14 EF 30-35% + ICD (followed by Dr. Lovena Le) - ICD gen chang 06/01/13  The rest of his medical history is notable for obesity, hypertension,  hyperlipidemia, and glucose intolerance.   Here for follow up: Just finished at [employer]. Felt great. Now staying on with a membership. More energy. SOB with some activities. Can walk TM for 30 mins 2.6mph+ with 1% incline. Takes lasix about 3x a week and loses 3-4 pounds as needed. .  No shocks from ICD. SBP at home 120s after he takes his medications.   ICD interrogated personally: No VT/AF. Activity level 2+ hours. Fluid ok.   Labs: (11/2012)L: K+ 4.1, Cr. 1.1, cholesterol 122, TG 137, HDL 43, LDL 51            (11/17/13)  TC 115, TG 101, HDL 37, LDL 58  HgbA1c 6.5  ROS: All systems negative except as listed in HPI, PMH and Problem List.  Past Medical History  Diagnosis Date  . CAD (coronary artery disease)     s/p CABG  . Heart attack ([place]) 2001  . Heart failure     CHF due to ischemic CM  . Ischemic cardiomyopathy   . Excess weight   . Hypertension   . Hyperlipidemia   . Fasting hyperglycemia   . BPH (benign prostatic hypertrophy)   . Microscopic hematuria     [facility]  . Diverticulosis   .  Diverticulosis of colon without hemorrhage 10/22/2007    Qualifier: Diagnosis of  By: Linna Darner MD, [name]      Current Outpatient Prescriptions  Medication Sig Dispense Refill  . acetaminophen (TYLENOL) 500 MG tablet Take 500 mg by mouth at bedtime.     Marland Kitchen aspirin 81 MG tablet Take 1 tablet (81 mg total) by mouth every evening.    . carvedilol (COREG) 25 MG tablet Take 0.5 tablets (12.5 mg total) by mouth 2 (two) times daily. 180 tablet 3  . Cholecalciferol (VITAMIN D3) 2000 UNITS TABS Take 2,000 Units by mouth every evening.     . folic acid (FOLVITE) A999333 MCG tablet Take 400 mcg by mouth daily.      . furosemide (LASIX) 40 MG tablet Take 1 tablet (40 mg total) by mouth daily. 90 tablet 3  . Glucosamine HCl 1500 MG TABS Take 1,500 mg by mouth every evening.     Marland Kitchen losartan (COZAAR) 50 MG tablet Take 50 mg by mouth daily.    . Multiple Vitamin (MULTIVITAMIN WITH MINERALS) TABS Take 1 tablet by mouth daily.     . potassium chloride SA (K-DUR,KLOR-CON) 20 MEQ tablet Take 1 tablet (20 mEq total) by mouth daily. 90 tablet 3  . sildenafil (VIAGRA)  100 MG tablet Take 1 tablet (100 mg total) by mouth as needed for erectile dysfunction. 10 tablet 3  . simvastatin (ZOCOR) 40 MG tablet Take 0.5 tablets (20 mg total) by mouth at bedtime. 90 tablet 3   No current facility-administered medications for this encounter.   Filed Vitals:   05/30/15 1510  BP: 122/70  Pulse: 90  Weight: 247 lb 12 oz (112.379 kg)  SpO2: 94%    PHYSICAL EXAM: General: Elderly appearing. No resp difficulty HEENT: normal; large knot on L side of forehead.  Neck: supple. JVP flat. Carotids 2+ bilaterally; no bruits. No lymphadenopathy or thryomegaly appreciated. Cor: PMI normal. Regular rate & rhythm. No rubs, gallops or murmurs. Lungs: clear Abdomen: soft, nontender, nondistended. No hepatosplenomegaly. No bruits or masses. Good bowel sounds. Extremities: no cyanosis, clubbing, rash, edema Neuro: alert & orientedx3, cranial  nerves grossly intact. Moves all 4 extremities w/o difficulty. Affect pleasant.  ASSESSMENT & PLAN:  1) CAD: s/p CABG 2001 - No s/s of ischemia. Will continue medical management (BB, statin, and ASA) - Continue  ASA to 81 2) Chronic systolic HF: ICM, EF 99991111 (echo 2014) s/p ICD.  NYHA II-III symptoms.  - Volume status looks good on exam and by Optivol. Continue lasix 40 mg as needed.  - Exercise capacity much improved - [facility]  - Will continue coreg at 12.5 mg BID and losartan 50 mg daily. Unable to tolerate higher doses of coreg or losartan in past due to dizziness and fatigue. I am worried he will also not be able to tolerate Entresto. - Check echo - Will see back in 6 months 3) HTN - has been low in past. Improved with cutting back carvedilol 4) HLD - managed by PCP. Continue statin  Glori Bickers MD 3:18 PM

## 2015-05-30 NOTE — Progress Notes (Signed)
Advanced Heart Failure Medication Review by a Pharmacist  Does the patient  feel that his/her medications are working for him/her?  yes  Has the patient been experiencing any side effects to the medications prescribed?  no  Does the patient measure his/her own blood pressure or blood glucose at home?  yes   Does the patient have any problems obtaining medications due to transportation or finances?   no  Understanding of regimen: good Understanding of indications: good Potential of compliance: good Patient understands to avoid NSAIDs. Patient understands to avoid decongestants.  Issues to address at subsequent visits: None   Pharmacist comments:  Joseph Malone is a pleasant 82 yo M presenting with a current medication list. He reports good compliance with his regimen but does admit to only taking his lasix about 3 times per week 2/2 having to go out most days of the week. He did not have any other medication-related questions or concerns for me at this time.   Ruta Hinds. Velva Harman, PharmD, BCPS, CPP Clinical Pharmacist Pager: [phone] Phone: [phone] 05/30/2015 3:19 PM      Time with patient: 10 minutes Preparation and documentation time: 2 minutes Total time: 12 minutes

## 2015-06-05 ENCOUNTER — Encounter: Payer: Self-pay | Admitting: Internal Medicine

## 2015-06-13 ENCOUNTER — Other Ambulatory Visit: Payer: Self-pay

## 2015-06-13 ENCOUNTER — Ambulatory Visit (HOSPITAL_COMMUNITY): Payer: Medicare Other | Attending: Internal Medicine

## 2015-06-13 DIAGNOSIS — R29898 Other symptoms and signs involving the musculoskeletal system: Secondary | ICD-10-CM | POA: Insufficient documentation

## 2015-06-13 DIAGNOSIS — I110 Hypertensive heart disease with heart failure: Secondary | ICD-10-CM | POA: Diagnosis not present

## 2015-06-13 DIAGNOSIS — I5022 Chronic systolic (congestive) heart failure: Secondary | ICD-10-CM | POA: Diagnosis not present

## 2015-06-13 DIAGNOSIS — I071 Rheumatic tricuspid insufficiency: Secondary | ICD-10-CM | POA: Diagnosis not present

## 2015-06-13 DIAGNOSIS — E785 Hyperlipidemia, unspecified: Secondary | ICD-10-CM | POA: Diagnosis not present

## 2015-06-13 DIAGNOSIS — I255 Ischemic cardiomyopathy: Secondary | ICD-10-CM | POA: Diagnosis not present

## 2015-06-13 DIAGNOSIS — I2510 Atherosclerotic heart disease of native coronary artery without angina pectoris: Secondary | ICD-10-CM | POA: Diagnosis not present

## 2015-06-13 DIAGNOSIS — E119 Type 2 diabetes mellitus without complications: Secondary | ICD-10-CM | POA: Insufficient documentation

## 2015-06-13 DIAGNOSIS — I509 Heart failure, unspecified: Secondary | ICD-10-CM | POA: Diagnosis present

## 2015-06-27 ENCOUNTER — Encounter: Payer: Self-pay | Admitting: Internal Medicine

## 2015-06-27 ENCOUNTER — Ambulatory Visit (INDEPENDENT_AMBULATORY_CARE_PROVIDER_SITE_OTHER): Payer: Medicare Other | Admitting: Internal Medicine

## 2015-06-27 VITALS — BP 122/84 | HR 93 | Ht 71.0 in | Wt 248.8 lb

## 2015-06-27 DIAGNOSIS — I255 Ischemic cardiomyopathy: Secondary | ICD-10-CM | POA: Diagnosis not present

## 2015-06-27 NOTE — Patient Instructions (Signed)
Medication Instructions:  Your physician recommends that you continue on your current medications as directed. Please refer to the Current Medication list given to you today.   Labwork: None ordered   Testing/Procedures: None ordered   Follow-Up: Your physician wants you to follow-up in: 12 months with Dr Knox Saliva will receive a reminder letter in the mail two months in advance. If you don't receive a letter, please call our office to schedule the follow-up appointment.  Remote monitoring is used to monitor your ICD from home. This monitoring reduces the number of office visits required to check your device to one time per year. It allows [place] to keep an eye on the functioning of your device to ensure it is working properly. You are scheduled for a device check from home on 09/26/15. You may send your transmission at any time that day. If you have a wireless device, the transmission will be sent automatically. After your physician reviews your transmission, you will receive a postcard with your next transmission date.     Any Other Special Instructions Will Be Listed Below (If Applicable).     If you need a refill on your cardiac medications before your next appointment, please call your pharmacy.

## 2015-06-28 LAB — CUP PACEART INCLINIC DEVICE CHECK
Date Time Interrogation Session: 20170517162208
HIGH POWER IMPEDANCE MEASURED VALUE: 75 Ohm
Lead Channel Impedance Value: 513 Ohm
Lead Channel Pacing Threshold Amplitude: 0.5 V
Lead Channel Sensing Intrinsic Amplitude: 15 mV
Lead Channel Sensing Intrinsic Amplitude: 23.5 mV
Lead Channel Setting Sensing Sensitivity: 0.3 mV
MDC IDC LEAD IMPLANT DT: 20150422
MDC IDC LEAD LOCATION: 753860
MDC IDC LEAD MODEL: 6935
MDC IDC MSMT BATTERY REMAINING LONGEVITY: 126 mo
MDC IDC MSMT BATTERY VOLTAGE: 3.02 V
MDC IDC MSMT LEADCHNL RV IMPEDANCE VALUE: 551 Ohm
MDC IDC MSMT LEADCHNL RV PACING THRESHOLD PULSEWIDTH: 0.4 ms
MDC IDC SET LEADCHNL RV PACING AMPLITUDE: 2 V
MDC IDC SET LEADCHNL RV PACING PULSEWIDTH: 0.4 ms
MDC IDC STAT BRADY RV PERCENT PACED: 0.02 %

## 2015-06-29 NOTE — Progress Notes (Signed)
HPI [name] returns today for followup. He is a very pleasant 82 year old man with an ischemic cardiomyopathy, chronic systolic heart failure, status post ICD implantation. He is s/p RV lead revision in the past. He notes that he has developed sob and fatigue and his blood pressure is at times low, under a hundred systolic. He denies sodium indiscretion. He has been having some trouble with too much fluid loss with his diuretic therapy. Allergies  Allergen Reactions  . Penicillins     Rash Because of a history of documented adverse serious drug reaction;Medi Alert bracelet  is recommended     Current Outpatient Prescriptions  Medication Sig Dispense Refill  . acetaminophen (TYLENOL) 500 MG tablet Take 500 mg by mouth at bedtime.     Marland Kitchen aspirin 81 MG tablet Take 1 tablet (81 mg total) by mouth every evening.    . carvedilol (COREG) 25 MG tablet Take 0.5 tablets (12.5 mg total) by mouth 2 (two) times daily. 180 tablet 3  . Cholecalciferol (VITAMIN D3) 2000 UNITS TABS Take 2,000 Units by mouth every evening.     . folic acid (FOLVITE) A999333 MCG tablet Take 400 mcg by mouth daily.      . furosemide (LASIX) 40 MG tablet Take 1 tablet (40 mg total) by mouth daily. 90 tablet 3  . Glucosamine HCl 1500 MG TABS Take 1,500 mg by mouth every evening.     Marland Kitchen losartan (COZAAR) 50 MG tablet Take 50 mg by mouth daily.    . Multiple Vitamin (MULTIVITAMIN WITH MINERALS) TABS Take 1 tablet by mouth daily.     . potassium chloride SA (K-DUR,KLOR-CON) 20 MEQ tablet Take 1 tablet (20 mEq total) by mouth daily. 90 tablet 3  . sildenafil (VIAGRA) 100 MG tablet Take 1 tablet (100 mg total) by mouth as needed for erectile dysfunction. 10 tablet 3  . simvastatin (ZOCOR) 40 MG tablet Take 0.5 tablets (20 mg total) by mouth at bedtime. 90 tablet 3   No current facility-administered medications for this visit.     Past Medical History  Diagnosis Date  . CAD (coronary artery disease)     s/p CABG  . Heart attack  ([place]) 2001  . Heart failure     CHF due to ischemic CM  . Ischemic cardiomyopathy   . Excess weight   . Hypertension   . Hyperlipidemia   . Fasting hyperglycemia   . BPH (benign prostatic hypertrophy)   . Microscopic hematuria     [facility]  . Diverticulosis   . Diverticulosis of colon without hemorrhage 10/22/2007    Qualifier: Diagnosis of  By: Linna Darner MD, Etter Sjogren:   All systems reviewed and negative except as noted in the HPI.   Past Surgical History  Procedure Laterality Date  . Coronary artery bypass graft  2000    1 vessel  . Appendectomy    . Pilonidal cyst excision    . Cataract extraction  2008    bilateral with lens implant  . Colonoscopy  2004    Tics; [facility]  . Transthoracic echocardiogram  08/29/2005  . Cardiac defibrillator placement  2005    [employer]; s/p ICD gen change and RV lead revision April 2015  . Cardiac catheterization  2000  . Cystoscopy  06/2012    Dr [name]  . Implantable cardioverter defibrillator (icd) generator change N/A 06/01/2013    Procedure: ICD GENERATOR CHANGE;  Surgeon: [name], MD;  Location[facility] CATH  LAB;  Service: Cardiovascular;  Laterality: N/A;  . Lead revision N/A 06/01/2013    Procedure: LEAD REVISION;  Surgeon: [name], MD;  Location: [facility] CATH LAB;  Service: Cardiovascular;  Laterality: N/A;     Family History  Problem Relation Age of Onset  . Hypertension Father   . Heart attack Father 54  . Diabetes Brother     X 2  . Heart attack Brother 31  . Heart attack Paternal Uncle 8  . Sudden death Paternal Grandfather 63    ? heat stroke  . Lung cancer Sister     "Asian lung cancer"  . Stroke Neg Hx      Social History   Social History  . Marital Status: Married    Spouse Name: N/A  . Number of Children: N/A  . Years of Education: N/A   Occupational History  . Not on file.   Social History Main Topics  . Smoking status: Passive Smoke Exposure - Never Smoker  . Smokeless  tobacco: Never Used  . Alcohol Use: No  . Drug Use: No  . Sexual Activity: No   Other Topics Concern  . Not on file   Social History Narrative   Married 1957. 4 children 2 boys 2 girls. 15 grandkids.  4 greatgrandkids.       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       Hobbies: genealogy, travel      No HCPOA-advised to do this.      BP 122/84 mmHg  Pulse 93  Ht 5\' 11"  (1.803 m)  Wt 248 lb 12.8 oz (112.855 kg)  BMI 34.72 kg/m2  Physical Exam:  stable appearing 82 year old man, NAD HEENT: Unremarkable Neck:  7 cm JVD, no thyromegally Lungs:  Clear with no wheezes, rales, or rhonchi. HEART:  Regular rate rhythm, no murmurs, no rubs, no clicks Abd:  soft, obese, positive bowel sounds, no organomegally, no rebound, no guarding Ext:  2 plus pulses, no edema, no cyanosis, no clubbing Skin:  No rashes no nodules Neuro:  CN II through XII intact, motor grossly intact  DEVICE  Normal device function.  See PaceArt for details.   Assess/Plan:  1. Chronic systolic heart failure - his symptoms are class 2. No change in meds. 2. ICD - his device is working normally. Will recheck in several months. 3. PVC's - he is mostly asymptomatic. Will follow.  Mikle Bosworth.D.

## 2015-09-26 ENCOUNTER — Ambulatory Visit (INDEPENDENT_AMBULATORY_CARE_PROVIDER_SITE_OTHER): Payer: Medicare Other | Admitting: *Deleted

## 2015-09-26 DIAGNOSIS — I255 Ischemic cardiomyopathy: Secondary | ICD-10-CM | POA: Diagnosis not present

## 2015-09-26 DIAGNOSIS — I5022 Chronic systolic (congestive) heart failure: Secondary | ICD-10-CM

## 2015-09-26 NOTE — Progress Notes (Signed)
Remote ICD transmission.   

## 2015-09-27 ENCOUNTER — Encounter: Payer: Self-pay | Admitting: Cardiology

## 2015-10-16 LAB — CUP PACEART REMOTE DEVICE CHECK
Battery Remaining Longevity: 124 mo
Battery Voltage: 3.01 V
Date Time Interrogation Session: 20170816073323
HIGH POWER IMPEDANCE MEASURED VALUE: 77 Ohm
Lead Channel Impedance Value: 456 Ohm
Lead Channel Sensing Intrinsic Amplitude: 15.125 mV
Lead Channel Setting Pacing Pulse Width: 0.4 ms
MDC IDC LEAD IMPLANT DT: 20150422
MDC IDC LEAD LOCATION: 753860
MDC IDC LEAD MODEL: 6935
MDC IDC MSMT LEADCHNL RV IMPEDANCE VALUE: 532 Ohm
MDC IDC MSMT LEADCHNL RV PACING THRESHOLD AMPLITUDE: 0.5 V
MDC IDC MSMT LEADCHNL RV PACING THRESHOLD PULSEWIDTH: 0.4 ms
MDC IDC MSMT LEADCHNL RV SENSING INTR AMPL: 15.125 mV
MDC IDC SET LEADCHNL RV PACING AMPLITUDE: 2 V
MDC IDC SET LEADCHNL RV SENSING SENSITIVITY: 0.3 mV
MDC IDC STAT BRADY RV PERCENT PACED: 0.01 %

## 2015-12-11 ENCOUNTER — Ambulatory Visit (INDEPENDENT_AMBULATORY_CARE_PROVIDER_SITE_OTHER): Payer: Medicare Other | Admitting: Family Medicine

## 2015-12-11 ENCOUNTER — Encounter: Payer: Self-pay | Admitting: Family Medicine

## 2015-12-11 VITALS — BP 124/78 | HR 74 | Temp 98.5°F | Wt 249.8 lb

## 2015-12-11 DIAGNOSIS — I10 Essential (primary) hypertension: Secondary | ICD-10-CM | POA: Diagnosis not present

## 2015-12-11 DIAGNOSIS — E785 Hyperlipidemia, unspecified: Secondary | ICD-10-CM | POA: Diagnosis not present

## 2015-12-11 DIAGNOSIS — E1151 Type 2 diabetes mellitus with diabetic peripheral angiopathy without gangrene: Secondary | ICD-10-CM

## 2015-12-11 DIAGNOSIS — M79662 Pain in left lower leg: Secondary | ICD-10-CM | POA: Diagnosis not present

## 2015-12-11 NOTE — Progress Notes (Signed)
Subjective:  Joseph Malone is a 82 y.o. year old very pleasant male patient who presents for/with See problem oriented charting ROS- no warmth or swelling in left leg. No erythema. n oshortness of breath above baseline.see any ROS included in HPI as well.   Past Medical History-  Patient Active Problem List   Diagnosis Date Noted  . Chronic systolic heart failure ([place]) 12/09/2010    Priority: High  . Cardiomyopathy, ischemic 10/02/2009    Priority: High  . Automatic implantable cardioverter-defibrillator in situ 01/16/2009    Priority: High  . DM (diabetes mellitus) type II controlled peripheral vascular disorder ([place]) 09/14/2008    Priority: High  . CAD in native artery 09/25/2006    Priority: High  . Hyperlipidemia 04/22/2007    Priority: Medium  . Essential hypertension 09/25/2006    Priority: Medium  . Erectile dysfunction 01/19/2014    Priority: Low  . Basal cell cancer 09/29/2012    Priority: Low  . Solitary pulmonary nodule 07/12/2012    Priority: Low  . BPH without obstruction/lower urinary tract symptoms 10/22/2007    Priority: Low  . Osteoarthritis 09/25/2006    Priority: Low  . Hematuria 09/25/2006    Priority: Low    Medications- reviewed and updated Current Outpatient Prescriptions  Medication Sig Dispense Refill  . acetaminophen (TYLENOL) 500 MG tablet Take 500 mg by mouth at bedtime.     Marland Kitchen aspirin 81 MG tablet Take 1 tablet (81 mg total) by mouth every evening.    . carvedilol (COREG) 25 MG tablet Take 0.5 tablets (12.5 mg total) by mouth 2 (two) times daily. 180 tablet 3  . Cholecalciferol (VITAMIN D3) 2000 UNITS TABS Take 2,000 Units by mouth every evening.     . folic acid (FOLVITE) A999333 MCG tablet Take 400 mcg by mouth daily.      . furosemide (LASIX) 40 MG tablet Take 1 tablet (40 mg total) by mouth daily. 90 tablet 3  . Glucosamine HCl 1500 MG TABS Take 1,500 mg by mouth every evening.     Marland Kitchen losartan (COZAAR) 50 MG tablet Take 50 mg by mouth daily.     . Multiple Vitamin (MULTIVITAMIN WITH MINERALS) TABS Take 1 tablet by mouth daily.     . potassium chloride SA (K-DUR,KLOR-CON) 20 MEQ tablet Take 1 tablet (20 mEq total) by mouth daily. 90 tablet 3  . sildenafil (VIAGRA) 100 MG tablet Take 1 tablet (100 mg total) by mouth as needed for erectile dysfunction. 10 tablet 3  . simvastatin (ZOCOR) 40 MG tablet Take 0.5 tablets (20 mg total) by mouth at bedtime. 90 tablet 3   No current facility-administered medications for this visit.     Objective: BP 124/78 (BP Location: Left Arm, Patient Position: Sitting, Cuff Size: Large)   Pulse 74   Temp 98.5 F (36.9 C) (Oral)   Wt 249 lb 12.8 oz (113.3 kg)   SpO2 93%   BMI 34.84 kg/m  Gen: NAD, resting comfortably CV: RRR no murmurs rubs or gallops Lungs: CTAB no crackles, wheeze, rhonchi Ext: no edema, patient does have pain with deep palpation of left calf Skin: warm, dry, no rash or erythema on left calf  Diabetic Foot Exam - Simple   Simple Foot Form Visual Inspection No deformities, no ulcerations, no other skin breakdown bilaterally:  Yes Sensation Testing Intact to touch and monofilament testing bilaterally:  Yes Pulse Check Posterior Tibialis and Dorsalis pulse intact bilaterally:  Yes Comments DP 1+ on left, 2+ on  right, PT similar 2+      Assessment/Plan:   Left calf pain S:walking in park about 3 weeks ago- in his calf it started hurting. Walked up from [employer] up hill next day and started hurting again. Can walk around house and doesn't bother him- any distance such a half a mile starts hurting in the calf and wonders if he can even make it in the car. Wondered if he strained it- thought may get better but has not. Has not tried anything but tylenol- no help. Gets better when he stops walking ROS- no swelling in leg. No history of blood clots and no family history. No history of cancer.  A/P: considered venous duplex and ABIs but since need to get labs to update chronic  medical conditions- opted for d- dimer and if elevated will get duplex at that time. Will get ABIs- DP pulse slightly weaker on left. Possibility of claudication exists. Could be calf strain but need to rule out more dangerous etiologies first.      DM (diabetes mellitus) type II controlled peripheral vascular disorder (HCC) S: diet/exercise controlled in past but trending up. Some home sugars have gotten into 130s which has concerned him. Has not followed up regularly as hoped.  A/P: reinforced at least every 6 month visits. Update a1c and direct LDL, get ROI for eye exam, updated foot exam. Used acute visit for leg pain to follow up on chronic issues due to poor follo wup.    Essential hypertension S: controlled on carvedilol and losartan BP Readings from Last 3 Encounters:  12/11/15 124/78  06/27/15 122/84  05/30/15 122/70  A/P:Continue current medications   Hyperlipidemia S: considering CAD LDL goal 70 and mild poorly controlled on  Simvastatin 20mg  (half tablet as full tablet caused fatigue). No myalgias.  Lab Results  Component Value Date   CHOL 115 11/17/2013   HDL 36.60 (L) 11/17/2013   LDLCALC 58 11/17/2013   LDLDIRECT 80.0 08/30/2014   TRIG 101.0 11/17/2013   CHOLHDL 3 11/17/2013   A/P: update direct LDL today- if still above 70- consider pushing for 40mg  trial again  advised 6 months at latest  Orders Placed This Encounter  Procedures  . D-dimer, Quantitative  . Hemoglobin A1c    [place]  . CBC    [place]  . Comprehensive metabolic panel    [place]  . LDL cholesterol, direct       Return precautions advised.  Garret Reddish, MD

## 2015-12-11 NOTE — Patient Instructions (Addendum)
Health Maintenance Due  Topic Date Due  . OPHTHALMOLOGY EXAM -Sign release of information at the check out desk for this 01/12/2015  . HEMOGLOBIN A1C - today 03/02/2015   Please don't let more than 6 months go by before checking in with [place]  Labs before you leave- if one test is positive for potential clot will get ultrasound  We will call you within a week about your referral for arterial testing. If you do not hear within 2 weeks, give [place] a call.

## 2015-12-11 NOTE — Progress Notes (Signed)
Pre visit review using our clinic review tool, if applicable. No additional management support is needed unless otherwise documented below in the visit note. 

## 2015-12-12 ENCOUNTER — Ambulatory Visit (HOSPITAL_COMMUNITY)
Admission: RE | Admit: 2015-12-12 | Discharge: 2015-12-12 | Disposition: A | Discharge status: Home / Self Care | Payer: Medicare Other | Source: Ambulatory Visit | Attending: Cardiovascular Disease | Admitting: Cardiovascular Disease

## 2015-12-12 ENCOUNTER — Telehealth: Payer: Self-pay

## 2015-12-12 ENCOUNTER — Other Ambulatory Visit: Payer: Self-pay

## 2015-12-12 DIAGNOSIS — M79662 Pain in left lower leg: Secondary | ICD-10-CM | POA: Diagnosis not present

## 2015-12-12 LAB — COMPREHENSIVE METABOLIC PANEL
ALT: 21 U/L (ref 0–53)
AST: 20 U/L (ref 0–37)
Albumin: 4.2 g/dL (ref 3.5–5.2)
Alkaline Phosphatase: 59 U/L (ref 39–117)
BUN: 18 mg/dL (ref 6–23)
CHLORIDE: 106 meq/L (ref 96–112)
CO2: 27 meq/L (ref 19–32)
CREATININE: 1.1 mg/dL (ref 0.40–1.50)
Calcium: 9.5 mg/dL (ref 8.4–10.5)
GFR: 67.81 mL/min (ref >60.00)
Glucose, Bld: 140 mg/dL — ABNORMAL HIGH (ref 70–99)
POTASSIUM: 4.4 meq/L (ref 3.5–5.1)
SODIUM: 140 meq/L (ref 135–145)
Total Bilirubin: 0.5 mg/dL (ref 0.2–1.2)
Total Protein: 6.8 g/dL (ref 6.0–8.3)

## 2015-12-12 LAB — CBC
HEMATOCRIT: 43.7 % (ref 39.0–52.0)
Hemoglobin: 14.9 g/dL (ref 13.0–17.0)
MCHC: 34.2 g/dL (ref 30.0–36.0)
MCV: 92.6 fl (ref 78.0–100.0)
Platelets: 187 10*3/uL (ref 150.0–400.0)
RBC: 4.72 Mil/uL (ref 4.22–5.81)
RDW: 12.9 % (ref 11.5–15.5)
WBC: 8 10*3/uL (ref 4.0–10.5)

## 2015-12-12 LAB — HEMOGLOBIN A1C: Hgb A1c MFr Bld: 6.9 % — ABNORMAL HIGH (ref 4.6–6.5)

## 2015-12-12 LAB — LDL CHOLESTEROL, DIRECT: Direct LDL: 70 mg/dL

## 2015-12-12 LAB — D-DIMER, QUANTITATIVE (NOT AT ARMC): D DIMER QUANT: 0.99 ug{FEU}/mL — AB (ref <0.50)

## 2015-12-12 NOTE — Telephone Encounter (Signed)
Received phone call from [facility] letting me know that preliminary venous doppler is negative. Dr. Yong Channel is aware.

## 2015-12-12 NOTE — Assessment & Plan Note (Signed)
S: diet/exercise controlled in past but trending up. Some home sugars have gotten into 130s which has concerned him. Has not followed up regularly as hoped.  A/P: reinforced at least every 6 month visits. Update a1c and direct LDL, get ROI for eye exam, updated foot exam. Used acute visit for leg pain to follow up on chronic issues due to poor follo wup.

## 2015-12-12 NOTE — Telephone Encounter (Signed)
Spoke with patient and let him know he is scheduled at 11:00 at [facility] on the 2nd floor in [address]. He is scheduled for a venous doppler study. Patient verbalize understanding and is going to the appointment.

## 2015-12-12 NOTE — Telephone Encounter (Signed)
Thanks for update- will await final report

## 2015-12-12 NOTE — Assessment & Plan Note (Signed)
S: controlled on carvedilol and losartan BP Readings from Last 3 Encounters:  12/11/15 124/78  06/27/15 122/84  05/30/15 122/70  A/P:Continue current medications

## 2015-12-12 NOTE — Assessment & Plan Note (Signed)
S: considering CAD LDL goal 70 and mild poorly controlled on  Simvastatin 20mg  (half tablet as full tablet caused fatigue). No myalgias.  Lab Results  Component Value Date   CHOL 115 11/17/2013   HDL 36.60 (L) 11/17/2013   LDLCALC 58 11/17/2013   LDLDIRECT 80.0 08/30/2014   TRIG 101.0 11/17/2013   CHOLHDL 3 11/17/2013   A/P: update direct LDL today- if still above 70- consider pushing for 40mg  trial again

## 2015-12-13 ENCOUNTER — Encounter (HOSPITAL_COMMUNITY): Payer: Self-pay | Admitting: Cardiovascular Disease

## 2015-12-26 ENCOUNTER — Ambulatory Visit (INDEPENDENT_AMBULATORY_CARE_PROVIDER_SITE_OTHER): Payer: Medicare Other | Admitting: *Deleted

## 2015-12-26 DIAGNOSIS — I255 Ischemic cardiomyopathy: Secondary | ICD-10-CM

## 2015-12-26 NOTE — Progress Notes (Signed)
Remote ICD transmission.   

## 2016-01-02 ENCOUNTER — Encounter: Payer: Self-pay | Admitting: Cardiology

## 2016-01-05 LAB — CUP PACEART REMOTE DEVICE CHECK
Battery Remaining Longevity: 122 mo
Battery Voltage: 3.01 V
Brady Statistic RV Percent Paced: 0.01 %
HighPow Impedance: 67 Ohm
Implantable Lead Location: 753860
Implantable Lead Model: 6935
Lead Channel Impedance Value: 475 Ohm
Lead Channel Sensing Intrinsic Amplitude: 13.625 mV
Lead Channel Setting Pacing Amplitude: 2 V
Lead Channel Setting Pacing Pulse Width: 0.4 ms
Lead Channel Setting Sensing Sensitivity: 0.3 mV
MDC IDC LEAD IMPLANT DT: 20150422
MDC IDC MSMT LEADCHNL RV IMPEDANCE VALUE: 399 Ohm
MDC IDC MSMT LEADCHNL RV PACING THRESHOLD AMPLITUDE: 0.5 V
MDC IDC MSMT LEADCHNL RV PACING THRESHOLD PULSEWIDTH: 0.4 ms
MDC IDC MSMT LEADCHNL RV SENSING INTR AMPL: 13.625 mV
MDC IDC PG IMPLANT DT: 20150422
MDC IDC SESS DTM: 20171115094226

## 2016-01-11 ENCOUNTER — Other Ambulatory Visit (INDEPENDENT_AMBULATORY_CARE_PROVIDER_SITE_OTHER): Payer: Medicare Other

## 2016-01-11 DIAGNOSIS — E785 Hyperlipidemia, unspecified: Secondary | ICD-10-CM | POA: Diagnosis not present

## 2016-01-11 DIAGNOSIS — Z125 Encounter for screening for malignant neoplasm of prostate: Secondary | ICD-10-CM

## 2016-01-11 DIAGNOSIS — R319 Hematuria, unspecified: Secondary | ICD-10-CM

## 2016-01-11 DIAGNOSIS — I10 Essential (primary) hypertension: Secondary | ICD-10-CM | POA: Diagnosis not present

## 2016-01-11 LAB — POC URINALSYSI DIPSTICK (AUTOMATED)
Bilirubin, UA: NEGATIVE
GLUCOSE UA: NEGATIVE
Ketones, UA: NEGATIVE
LEUKOCYTES UA: NEGATIVE
NITRITE UA: NEGATIVE
Spec Grav, UA: >=1.03
UROBILINOGEN UA: 0.2
pH, UA: 5.5

## 2016-01-11 LAB — MICROALBUMIN / CREATININE URINE RATIO
CREATININE, U: 252.2 mg/dL
MICROALB UR: 1.1 mg/dL (ref 0.0–1.9)
MICROALB/CREAT RATIO: 0.4 mg/g (ref 0.0–30.0)

## 2016-01-11 LAB — BASIC METABOLIC PANEL
BUN: 22 mg/dL (ref 6–23)
CHLORIDE: 106 meq/L (ref 96–112)
CO2: 27 meq/L (ref 19–32)
CREATININE: 1.23 mg/dL (ref 0.40–1.50)
Calcium: 9.4 mg/dL (ref 8.4–10.5)
GFR: 59.6 mL/min — ABNORMAL LOW (ref >60.00)
Glucose, Bld: 151 mg/dL — ABNORMAL HIGH (ref 70–99)
Potassium: 4.2 mEq/L (ref 3.5–5.1)
Sodium: 142 mEq/L (ref 135–145)

## 2016-01-11 LAB — LIPID PANEL
CHOL/HDL RATIO: 4
CHOLESTEROL: 154 mg/dL (ref 0–200)
HDL: 41 mg/dL (ref >39.00)
LDL Cholesterol: 78 mg/dL (ref 0–99)
NonHDL: 112.81
TRIGLYCERIDES: 174 mg/dL — AB (ref 0.0–149.0)
VLDL: 34.8 mg/dL (ref 0.0–40.0)

## 2016-01-11 LAB — HEMOGLOBIN A1C: HEMOGLOBIN A1C: 7 % — AB (ref 4.6–6.5)

## 2016-01-11 LAB — HEPATIC FUNCTION PANEL
ALBUMIN: 3.9 g/dL (ref 3.5–5.2)
ALT: 22 U/L (ref 0–53)
AST: 20 U/L (ref 0–37)
Alkaline Phosphatase: 57 U/L (ref 39–117)
BILIRUBIN DIRECT: 0.1 mg/dL (ref 0.0–0.3)
TOTAL PROTEIN: 6.6 g/dL (ref 6.0–8.3)
Total Bilirubin: 0.6 mg/dL (ref 0.2–1.2)

## 2016-01-11 LAB — CBC WITH DIFFERENTIAL/PLATELET
BASOS PCT: 0.6 % (ref 0.0–3.0)
Basophils Absolute: 0 10*3/uL (ref 0.0–0.1)
EOS ABS: 0.3 10*3/uL (ref 0.0–0.7)
Eosinophils Relative: 3.6 % (ref 0.0–5.0)
HEMATOCRIT: 42.7 % (ref 39.0–52.0)
Hemoglobin: 14.6 g/dL (ref 13.0–17.0)
LYMPHS PCT: 45.7 % (ref 12.0–46.0)
Lymphs Abs: 3.2 10*3/uL (ref 0.7–4.0)
MCHC: 34.2 g/dL (ref 30.0–36.0)
MCV: 92.4 fl (ref 78.0–100.0)
Monocytes Absolute: 0.6 10*3/uL (ref 0.1–1.0)
Monocytes Relative: 8.7 % (ref 3.0–12.0)
NEUTROS ABS: 2.9 10*3/uL (ref 1.4–7.7)
Neutrophils Relative %: 41.4 % — ABNORMAL LOW (ref 43.0–77.0)
PLATELETS: 170 10*3/uL (ref 150.0–400.0)
RBC: 4.62 Mil/uL (ref 4.22–5.81)
RDW: 12.8 % (ref 11.5–15.5)
WBC: 7.1 10*3/uL (ref 4.0–10.5)

## 2016-01-11 LAB — TSH: TSH: 0.48 u[IU]/mL (ref 0.35–4.50)

## 2016-01-18 ENCOUNTER — Other Ambulatory Visit: Payer: Self-pay

## 2016-01-18 ENCOUNTER — Ambulatory Visit (INDEPENDENT_AMBULATORY_CARE_PROVIDER_SITE_OTHER): Payer: Medicare Other | Admitting: Family Medicine

## 2016-01-18 ENCOUNTER — Encounter: Payer: Self-pay | Admitting: Family Medicine

## 2016-01-18 VITALS — BP 134/76 | HR 84 | Temp 98.0°F | Ht 70.75 in | Wt 246.4 lb

## 2016-01-18 DIAGNOSIS — Z0000 Encounter for general adult medical examination without abnormal findings: Secondary | ICD-10-CM

## 2016-01-18 NOTE — Progress Notes (Signed)
Phone: [phone]  Subjective:  Patient presents today for their annual physical. Chief complaint-noted.   See problem oriented charting- ROS- full  review of systems was completed and negative except for intermittent chest pain that cardiology is aware of. Mild shortness of breath but not worsening. Overall weak feeling.   The following were reviewed and entered/updated in epic: Past Medical History:  Diagnosis Date  . BPH (benign prostatic hypertrophy)   . CAD (coronary artery disease)    s/p CABG  . Diverticulosis   . Diverticulosis of colon without hemorrhage 10/22/2007   Qualifier: Diagnosis of  By: Linna Darner MD, [name]    . Excess weight   . Fasting hyperglycemia   . Heart attack 2001  . Heart failure    CHF due to ischemic CM  . Hyperlipidemia   . Hypertension   . Ischemic cardiomyopathy   . Microscopic hematuria    [facility]   Patient Active Problem List   Diagnosis Date Noted  . Chronic systolic heart failure ([place]) 12/09/2010    Priority: High  . Cardiomyopathy, ischemic 10/02/2009    Priority: High  . Automatic implantable cardioverter-defibrillator in situ 01/16/2009    Priority: High  . DM (diabetes mellitus) type II controlled peripheral vascular disorder ([place]) 09/14/2008    Priority: High  . CAD in native artery 09/25/2006    Priority: High  . Hyperlipidemia 04/22/2007    Priority: Medium  . Essential hypertension 09/25/2006    Priority: Medium  . Erectile dysfunction 01/19/2014    Priority: Low  . Basal cell cancer 09/29/2012    Priority: Low  . Solitary pulmonary nodule 07/12/2012    Priority: Low  . BPH without obstruction/lower urinary tract symptoms 10/22/2007    Priority: Low  . Osteoarthritis 09/25/2006    Priority: Low  . Hematuria 09/25/2006    Priority: Low   Past Surgical History:  Procedure Laterality Date  . APPENDECTOMY    . CARDIAC CATHETERIZATION  2000  . CARDIAC DEFIBRILLATOR PLACEMENT  2005   [employer]; s/p ICD gen  change and RV lead revision April 2015  . CATARACT EXTRACTION  2008   bilateral with lens implant  . COLONOSCOPY  2004   Tics; [facility]  . CORONARY ARTERY BYPASS GRAFT  2000   1 vessel  . CYSTOSCOPY  06/2012   Dr [name]  . IMPLANTABLE CARDIOVERTER DEFIBRILLATOR (ICD) GENERATOR CHANGE N/A 06/01/2013   Procedure: ICD GENERATOR CHANGE;  Surgeon: [name], MD;  Location: [facility] CATH LAB;  Service: Cardiovascular;  Laterality: N/A;  . LEAD REVISION N/A 06/01/2013   Procedure: LEAD REVISION;  Surgeon: [name], MD;  Location: [facility] CATH LAB;  Service: Cardiovascular;  Laterality: N/A;  . PILONIDAL CYST EXCISION    . TRANSTHORACIC ECHOCARDIOGRAM  08/29/2005    Family History  Problem Relation Age of Onset  . Hypertension Father   . Heart attack Father 44  . Diabetes Brother     X 2  . Heart attack Brother 66  . Heart attack Paternal Uncle 74  . Sudden death Paternal Grandfather 59    ? heat stroke  . Lung cancer Sister     "Asian lung cancer"  . Stroke Neg Hx     Medications- reviewed and updated Current Outpatient Prescriptions  Medication Sig Dispense Refill  . acetaminophen (TYLENOL) 500 MG tablet Take 500 mg by mouth at bedtime.     Marland Kitchen aspirin 81 MG tablet Take 1 tablet (81 mg total) by mouth every evening.    Marland Kitchen  carvedilol (COREG) 25 MG tablet Take 0.5 tablets (12.5 mg total) by mouth 2 (two) times daily. 180 tablet 3  . Cholecalciferol (VITAMIN D3) 2000 UNITS TABS Take 2,000 Units by mouth every evening.     . folic acid (FOLVITE) A999333 MCG tablet Take 400 mcg by mouth daily.      . furosemide (LASIX) 40 MG tablet Take 1 tablet (40 mg total) by mouth daily. 90 tablet 3  . Glucosamine HCl 1500 MG TABS Take 1,500 mg by mouth every evening.     Marland Kitchen losartan (COZAAR) 50 MG tablet Take 50 mg by mouth daily.    . Multiple Vitamin (MULTIVITAMIN WITH MINERALS) TABS Take 1 tablet by mouth daily.     . potassium chloride SA (K-DUR,KLOR-CON) 20 MEQ tablet Take 1 tablet (20 mEq total)  by mouth daily. 90 tablet 3  . sildenafil (VIAGRA) 100 MG tablet Take 1 tablet (100 mg total) by mouth as needed for erectile dysfunction. 10 tablet 3  . simvastatin (ZOCOR) 40 MG tablet Take 0.5 tablets (20 mg total) by mouth at bedtime. 90 tablet 3   No current facility-administered medications for this visit.     Allergies-reviewed and updated Allergies  Allergen Reactions  . Penicillins     Rash Because of a history of documented adverse serious drug reaction;Medi Alert bracelet  is recommended    Social History   Social History  . Marital status: Married    Spouse name: N/A  . Number of children: N/A  . Years of education: N/A   Social History Main Topics  . Smoking status: Passive Smoke Exposure - Never Smoker  . Smokeless tobacco: Never Used  . Alcohol use No  . Drug use: No  . Sexual activity: No   Other Topics Concern  . None   Social History Narrative   Married 1957. 4 children 2 boys 2 girls. 15 grandkids.  4 greatgrandkids.       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       Hobbies: genealogy, travel      No HCPOA-advised to do this.     Objective: BP 134/76 (BP Location: Left Arm, Patient Position: Sitting, Cuff Size: Large)   Pulse 84   Temp 98 F (36.7 C) (Oral)   Ht 5' 10.75" (1.797 m)   Wt 246 lb 6.4 oz (111.8 kg)   SpO2 90%   BMI 34.61 kg/m  Gen: NAD, resting comfortably HEENT: Mucous membranes are moist. Oropharynx normal Neck: no thyromegaly CV: RRR no murmurs rubs or gallops Lungs: CTAB no crackles, wheeze, rhonchi Abdomen: soft/nontender/nondistended/normal bowel sounds. No rebound or guarding.  Ext: no edema Skin: warm, dry Neuro: grossly normal, moves all extremities, PERRLA  Diabetic Foot Exam - Simple   Simple Foot Form Diabetic Foot exam was performed with the following findings:  Yes 01/18/2016  1:13 PM  Visual Inspection See comments:  Yes Sensation Testing Intact to touch  and monofilament testing bilaterally:  Yes Pulse Check Posterior Tibialis and Dorsalis pulse intact bilaterally:  Yes Comments Dystrophic nails vs. Fungus- states have been like that for years    Assessment/Plan:  82 y.o. male presenting for annual physical.  Health Maintenance counseling: 1. Anticipatory guidance: Patient counseled regarding regular dental exams, eye exams, wearing seatbelts.  2. Risk factor reduction:  Advised patient of need for regular exercise and diet rich and fruits and vegetables to reduce risk of heart attack and stroke. Has been not doing  well on his routine at [facility] and is going to restart 3. Immunizations/screenings/ancillary studies Immunization History  Administered Date(s) Administered  . Influenza,inj,Quad PF,36+ Mos 11/11/2012  . Influenza-Unspecified 10/25/2013, 10/27/2014, 10/24/2015  . Pneumococcal Conjugate-13 08/21/2014  . Pneumococcal Polysaccharide-23 10/02/2009  . Td 09/17/2009  . Zoster 12/29/2012   Health Maintenance Due  Topic Date Due  . OPHTHALMOLOGY EXAM - get copy today 01/12/2015   4. Prostate cancer screening- given age, did not advise regular screening. Follows with urology for BPH.  Hematuria work up in 2014- does have known stones- cystoscopy negative in 2014 except for vessel near prostate friable- was told no need for futher hematuria workup  5. Colon cancer screening - passed age based screening 6. Skin cancer screening- Dr. [place] Crane follows yearly. History of basal cell  Status of chronic or acute concerns  DM- at goal with no medicine. Reasonable to continue as long as 7.5 or less. Discussed increasing exercise back- going back to [facility] Lab Results  Component Value Date   HGBA1C 7.0 (H) 01/11/2016   CAD/HLD/CHF/HTN- compliant with aspirin, coreg, simvastatin 20mg . LDL at 78- was cut back by cardiology due to generalized weakness but has not help. Going to discuss with cardiology at next visit if can go back up- my advise was to  strongly consider. Does have intermittent chest pain but cardiology is aware- has had these for 10 years and no ischemia on stress test 2047follows with Dr. Haroldine Laws- on coreg 12.5 BID, lasix 40mg  daily as needed with weight gain, losartan 50mg , also potassium 44meq daily as needed with lasix. BP controlled on above meds. Has defibrillator-pacemaker. CABG 1 vessel. discused new bp goals but at his age would not push for lower given orthostatic risks  6 months  Return precautions advised.   Garret Reddish, MD

## 2016-01-18 NOTE — Patient Instructions (Addendum)
I would also like for you to sign up for an annual wellness visit with our nurse, Manuela Schwartz, who specializes in the annual wellness exam. This is a free benefit under medicare that may help [place] find additional ways to help you. Some highlights are reviewing medications, lifestyle, and doing a dementia screen. Sign up for you and [place].   No other changes needed- you are doing well  We will try to hunt down the eye report

## 2016-01-18 NOTE — Progress Notes (Signed)
Pre visit review using our clinic review tool, if applicable. No additional management support is needed unless otherwise documented below in the visit note. 

## 2016-01-23 ENCOUNTER — Encounter: Payer: Self-pay | Admitting: Cardiology

## 2016-03-05 DIAGNOSIS — H43811 Vitreous degeneration, right eye: Secondary | ICD-10-CM | POA: Diagnosis not present

## 2016-03-05 DIAGNOSIS — H04123 Dry eye syndrome of bilateral lacrimal glands: Secondary | ICD-10-CM | POA: Diagnosis not present

## 2016-03-05 DIAGNOSIS — H43393 Other vitreous opacities, bilateral: Secondary | ICD-10-CM | POA: Diagnosis not present

## 2016-03-05 DIAGNOSIS — Z961 Presence of intraocular lens: Secondary | ICD-10-CM | POA: Diagnosis not present

## 2016-03-05 LAB — HM DIABETES EYE EXAM

## 2016-03-14 ENCOUNTER — Encounter: Payer: Self-pay | Admitting: Family Medicine

## 2016-03-24 ENCOUNTER — Encounter: Payer: Self-pay | Admitting: Family Medicine

## 2016-03-24 ENCOUNTER — Ambulatory Visit (INDEPENDENT_AMBULATORY_CARE_PROVIDER_SITE_OTHER): Payer: Medicare Other | Admitting: Family Medicine

## 2016-03-24 VITALS — BP 114/62 | HR 74 | Temp 98.3°F | Ht 70.75 in | Wt 244.2 lb

## 2016-03-24 DIAGNOSIS — N200 Calculus of kidney: Secondary | ICD-10-CM

## 2016-03-24 DIAGNOSIS — R309 Painful micturition, unspecified: Secondary | ICD-10-CM

## 2016-03-24 LAB — POC URINALSYSI DIPSTICK (AUTOMATED)
Bilirubin, UA: NEGATIVE
GLUCOSE UA: NEGATIVE
KETONES UA: NEGATIVE
Leukocytes, UA: NEGATIVE
Nitrite, UA: NEGATIVE
SPEC GRAV UA: 1.025
Urobilinogen, UA: 0.2
pH, UA: 5

## 2016-03-24 NOTE — Progress Notes (Signed)
Pre visit review using our clinic review tool, if applicable. No additional management support is needed unless otherwise documented below in the visit note. 

## 2016-03-24 NOTE — Assessment & Plan Note (Signed)
S: had a kidney stone last week. Kidney stone about once a year. Has seen urology previously and thought stones contributed to hematuria. Last week for several days-  With pain in left low back. Passed a "jagged" looking stone on Thursday then the next day he started to hurt all the way across his low back. Dry Cough and hurts in lower abdomen. Has a headache- tylenol not really helping. Has felt feverish. Had 2-3 days of diarrhea - pepto bismol didn't help but immodium did stop it. He states all symptoms including low back pain are improving.  A/P: Patient presents wondering about his pain in low back remaining- discussed unlikely to be remaining kidney stones given bilateral low back/no cva pain. He could have bilateral stones still but once again do no think that is cause of his pain and given he has had good success passing stones- we opted not to pursue imaging. He was also concerned about infection- reviewed reassuring UA. Known hematuria in past and has seen urology- does not want further workup.    I do think he has had a recent virus. No fever- we discussed swabbing for flu but he declines given improvement at this point and likely would not start  tamiflu at this point given improvement. Discussed return precautions for viral illness

## 2016-03-24 NOTE — Progress Notes (Signed)
Subjective:  Joseph Malone is a 83 y.o. year old very pleasant male patient who presents for/with See problem oriented charting ROS- has felt warm at times, dry cough, left lo wback pain greatly improved after passing stone, no shortness of breath, no chest pain   Past Medical History-  Patient Active Problem List   Diagnosis Date Noted  . Chronic systolic heart failure ([place]) 12/09/2010    Priority: High  . Cardiomyopathy, ischemic 10/02/2009    Priority: High  . Automatic implantable cardioverter-defibrillator in situ 01/16/2009    Priority: High  . DM (diabetes mellitus) type II controlled peripheral vascular disorder ([place]) 09/14/2008    Priority: High  . CAD in native artery 09/25/2006    Priority: High  . Hyperlipidemia 04/22/2007    Priority: Medium  . Essential hypertension 09/25/2006    Priority: Medium  . Erectile dysfunction 01/19/2014    Priority: Low  . Basal cell cancer 09/29/2012    Priority: Low  . Solitary pulmonary nodule 07/12/2012    Priority: Low  . BPH without obstruction/lower urinary tract symptoms 10/22/2007    Priority: Low  . Osteoarthritis 09/25/2006    Priority: Low  . Hematuria 09/25/2006    Priority: Low  . Nephrolithiasis 03/24/2016    Medications- reviewed and updated Current Outpatient Prescriptions  Medication Sig Dispense Refill  . acetaminophen (TYLENOL) 500 MG tablet Take 500 mg by mouth at bedtime.     Marland Kitchen aspirin 81 MG tablet Take 1 tablet (81 mg total) by mouth every evening.    . carvedilol (COREG) 25 MG tablet Take 0.5 tablets (12.5 mg total) by mouth 2 (two) times daily. 180 tablet 3  . Cholecalciferol (VITAMIN D3) 2000 UNITS TABS Take 2,000 Units by mouth every evening.     . folic acid (FOLVITE) A999333 MCG tablet Take 400 mcg by mouth daily.      . furosemide (LASIX) 40 MG tablet Take 1 tablet (40 mg total) by mouth daily. 90 tablet 3  . Glucosamine HCl 1500 MG TABS Take 1,500 mg by mouth every evening.     Marland Kitchen losartan (COZAAR) 50  MG tablet Take 50 mg by mouth daily.    . Multiple Vitamin (MULTIVITAMIN WITH MINERALS) TABS Take 1 tablet by mouth daily.     . potassium chloride SA (K-DUR,KLOR-CON) 20 MEQ tablet Take 1 tablet (20 mEq total) by mouth daily. 90 tablet 3  . sildenafil (VIAGRA) 100 MG tablet Take 1 tablet (100 mg total) by mouth as needed for erectile dysfunction. 10 tablet 3  . simvastatin (ZOCOR) 40 MG tablet Take 0.5 tablets (20 mg total) by mouth at bedtime. 90 tablet 3   No current facility-administered medications for this visit.     Objective: BP 114/62 (BP Location: Left Arm, Patient Position: Sitting, Cuff Size: Large)   Pulse 74   Temp 98.3 F (36.8 C) (Oral)   Ht 5' 10.75" (1.797 m)   Wt 244 lb 3.2 oz (110.8 kg)   SpO2 95%   BMI 34.30 kg/m  Gen: NAD, resting comfortably CV: RRR no murmurs rubs or gallops Lungs: CTAB no crackles, wheeze, rhonchi Abdomen: soft/nontender/nondistended/normal bowel sounds. No rebound or guarding. MSK: mild pain in paraspinous low back muscles without midline pain.   Ext: no edema Skin: warm, dry, no rash   Assessment/Plan:  Nephrolithiasis S: had a kidney stone last week. Kidney stone about once a year. Has seen urology previously and thought stones contributed to hematuria. Last week for several days-  With  pain in left low back. Passed a "jagged" looking stone on Thursday then the next day he started to hurt all the way across his low back. Dry Cough and hurts in lower abdomen. Has a headache- tylenol not really helping. Has felt feverish. Had 2-3 days of diarrhea - pepto bismol didn't help but immodium did stop it. He states all symptoms including low back pain are improving.  A/P: Patient presents wondering about his pain in low back remaining- discussed unlikely to be remaining kidney stones given bilateral low back/no cva pain. He could have bilateral stones still but once again do no think that is cause of his pain and given he has had good success passing  stones- we opted not to pursue imaging. He was also concerned about infection- reviewed reassuring UA. Known hematuria in past and has seen urology- does not want further workup.    I do think he has had a recent virus. No fever- we discussed swabbing for flu but he declines given improvement at this point and likely would not start  tamiflu at this point given improvement. Discussed return precautions for viral illness    Orders Placed This Encounter  Procedures  . POCT Urinalysis Dipstick (Automated)   Results for orders placed or performed in visit on 03/24/16 (from the past 24 hour(s))  POCT Urinalysis Dipstick (Automated)     Status: None   Collection Time: 03/24/16 11:50 AM  Result Value Ref Range   Color, UA yellow    Clarity, UA clear    Glucose, UA neg    Bilirubin, UA neg    Ketones, UA neg    Spec Grav, UA 1.025    Blood, UA Trace    pH, UA 5.0    Protein, UA 1+    Urobilinogen, UA 0.2    Nitrite, UA neg    Leukocytes, UA Negative Negative   Return precautions advised. - prn only planned Garret Reddish, MD

## 2016-03-24 NOTE — Patient Instructions (Signed)
Glad you passed the stone. Sorry it was so jagged.   Does not look like infection but let us know if new or worsening symptoms  Also if cold lasts more than 2-3 weeks let us know or if symptoms worsen like fever or shortness of breath in particular

## 2016-03-26 ENCOUNTER — Ambulatory Visit (INDEPENDENT_AMBULATORY_CARE_PROVIDER_SITE_OTHER): Payer: Medicare Other | Admitting: *Deleted

## 2016-03-26 DIAGNOSIS — I255 Ischemic cardiomyopathy: Secondary | ICD-10-CM | POA: Diagnosis not present

## 2016-03-26 NOTE — Progress Notes (Signed)
Remote ICD transmission.   

## 2016-03-27 ENCOUNTER — Encounter: Payer: Self-pay | Admitting: Cardiology

## 2016-03-28 LAB — CUP PACEART REMOTE DEVICE CHECK
Battery Remaining Longevity: 120 mo
Battery Voltage: 3.01 V
HighPow Impedance: 85 Ohm
Implantable Lead Location: 753860
Implantable Pulse Generator Implant Date: 20150422
Lead Channel Pacing Threshold Amplitude: 0.625 V
Lead Channel Pacing Threshold Pulse Width: 0.4 ms
Lead Channel Setting Pacing Amplitude: 2 V
Lead Channel Setting Pacing Pulse Width: 0.4 ms
MDC IDC LEAD IMPLANT DT: 20150422
MDC IDC MSMT LEADCHNL RV IMPEDANCE VALUE: 456 Ohm
MDC IDC MSMT LEADCHNL RV IMPEDANCE VALUE: 532 Ohm
MDC IDC MSMT LEADCHNL RV SENSING INTR AMPL: 19.125 mV
MDC IDC MSMT LEADCHNL RV SENSING INTR AMPL: 19.125 mV
MDC IDC SESS DTM: 20180214094224
MDC IDC SET LEADCHNL RV SENSING SENSITIVITY: 0.3 mV
MDC IDC STAT BRADY RV PERCENT PACED: 0.01 %

## 2016-04-10 ENCOUNTER — Encounter: Payer: Self-pay | Admitting: Cardiology

## 2016-04-14 DIAGNOSIS — N201 Calculus of ureter: Secondary | ICD-10-CM | POA: Diagnosis not present

## 2016-04-14 DIAGNOSIS — N5201 Erectile dysfunction due to arterial insufficiency: Secondary | ICD-10-CM | POA: Diagnosis not present

## 2016-05-13 ENCOUNTER — Other Ambulatory Visit (HOSPITAL_COMMUNITY): Payer: Self-pay | Admitting: Cardiology

## 2016-05-13 MED ORDER — LOSARTAN POTASSIUM 50 MG PO TABS: 50.0000 mg | ORAL_TABLET | Freq: Every day | ORAL | 3 refills | Status: DC

## 2016-06-16 ENCOUNTER — Encounter: Payer: Self-pay | Admitting: Internal Medicine

## 2016-07-02 ENCOUNTER — Encounter: Payer: Self-pay | Admitting: Internal Medicine

## 2016-07-02 ENCOUNTER — Ambulatory Visit (INDEPENDENT_AMBULATORY_CARE_PROVIDER_SITE_OTHER): Payer: Medicare Other | Admitting: Internal Medicine

## 2016-07-02 VITALS — BP 120/64 | HR 88 | Ht 71.0 in | Wt 246.0 lb

## 2016-07-02 DIAGNOSIS — I255 Ischemic cardiomyopathy: Secondary | ICD-10-CM | POA: Diagnosis not present

## 2016-07-02 DIAGNOSIS — Z95810 Presence of automatic (implantable) cardiac defibrillator: Secondary | ICD-10-CM

## 2016-07-02 NOTE — Patient Instructions (Addendum)
Medication Instructions:  Your physician has recommended you make the following change in your medication:  START back Lasix 40 mg daily   Labwork: None Ordered   Testing/Procedures:  None Ordered    Follow-Up: Your physician wants you to follow-up in: Dr. Haroldine Laws in 4-6 weeks.  Follow-up in 1 year with Dr. Lovena Le. You will receive a reminder letter in the mail two months in advance. If you don't receive a letter, please call our office to schedule the follow-up appointment.  Remote monitoring is used to monitor your ICD from home. This monitoring reduces the number of office visits required to check your device to one time per year. It allows [place] to keep an eye on the functioning of your device to ensure it is working properly. You are scheduled for a device check from home on 10/01/16. You may send your transmission at any time that day. If you have a wireless device, the transmission will be sent automatically. After your physician reviews your transmission, you will receive a postcard with your next transmission date.     Any Other Special Instructions Will Be Listed Below (If Applicable).     If you need a refill on your cardiac medications before your next appointment, please call your pharmacy.

## 2016-07-02 NOTE — Progress Notes (Signed)
HPI Mr. Lamons returns today for followup. He is a very pleasant 83 year old man with an ischemic cardiomyopathy, chronic systolic heart failure, status post ICD implantation. He notes that he has developed sob and fatigue.  He denies sodium indiscretion. His main complaint today is that he gets sob but notes that he has not been taking his lasix. He has not had any ICD shocks. Allergies  Allergen Reactions  . Penicillins     Rash Because of a history of documented adverse serious drug reaction;Medi Alert bracelet  is recommended     Current Outpatient Prescriptions  Medication Sig Dispense Refill  . acetaminophen (TYLENOL) 500 MG tablet Take 500 mg by mouth at bedtime.     Marland Kitchen aspirin 81 MG tablet Take 1 tablet (81 mg total) by mouth every evening.    . carvedilol (COREG) 25 MG tablet Take 0.5 tablets (12.5 mg total) by mouth 2 (two) times daily. 180 tablet 3  . Cholecalciferol (VITAMIN D3) 2000 UNITS TABS Take 2,000 Units by mouth every evening.     . folic acid (FOLVITE) 790 MCG tablet Take 400 mcg by mouth daily.      . furosemide (LASIX) 40 MG tablet Take 1 tablet (40 mg total) by mouth daily. 90 tablet 3  . Glucosamine HCl 1500 MG TABS Take 1,500 mg by mouth every evening.     Marland Kitchen losartan (COZAAR) 50 MG tablet Take 1 tablet (50 mg total) by mouth daily. 90 tablet 3  . Multiple Vitamin (MULTIVITAMIN WITH MINERALS) TABS Take 1 tablet by mouth daily.     . potassium chloride SA (K-DUR,KLOR-CON) 20 MEQ tablet Take 1 tablet (20 mEq total) by mouth daily. 90 tablet 3  . sildenafil (VIAGRA) 100 MG tablet Take 1 tablet (100 mg total) by mouth as needed for erectile dysfunction. 10 tablet 3  . simvastatin (ZOCOR) 40 MG tablet Take 0.5 tablets (20 mg total) by mouth at bedtime. 90 tablet 3   No current facility-administered medications for this visit.      Past Medical History:  Diagnosis Date  . BPH (benign prostatic hypertrophy)   . CAD (coronary artery disease)    s/p CABG  .  Diverticulosis   . Diverticulosis of colon without hemorrhage 10/22/2007   Qualifier: Diagnosis of  By: Linna Darner MD, [name]    . Excess weight   . Fasting hyperglycemia   . Heart attack ([place]) 2001  . Heart failure    CHF due to ischemic CM  . Hyperlipidemia   . Hypertension   . Ischemic cardiomyopathy   . Microscopic hematuria    [facility]    ROS:   All systems reviewed and negative except as noted in the HPI.   Past Surgical History:  Procedure Laterality Date  . APPENDECTOMY    . CARDIAC CATHETERIZATION  2000  . CARDIAC DEFIBRILLATOR PLACEMENT  2005   [employer]; s/p ICD gen change and RV lead revision April 2015  . CATARACT EXTRACTION  2008   bilateral with lens implant  . COLONOSCOPY  2004   Tics; [facility]  . CORONARY ARTERY BYPASS GRAFT  2000   1 vessel  . CYSTOSCOPY  06/2012   Dr [name]  . IMPLANTABLE CARDIOVERTER DEFIBRILLATOR (ICD) GENERATOR CHANGE N/A 06/01/2013   Procedure: ICD GENERATOR CHANGE;  Surgeon: [name], MD;  Location: [facility] C[facility];  Service: Cardiovascular;  Laterality: N/A;  . LEAD REVISION N/A 06/01/2013   Procedure: LEAD REVISION;  Surgeon: [name], MD;  Location: [facility]  CATH LAB;  Service: Cardiovascular;  Laterality: N/A;  . PILONIDAL CYST EXCISION    . TRANSTHORACIC ECHOCARDIOGRAM  08/29/2005     Family History  Problem Relation Age of Onset  . Hypertension Father   . Heart attack Father 72  . Diabetes Brother        X 2  . Heart attack Brother 63  . Heart attack Paternal Uncle 67  . Sudden death Paternal Grandfather 12       ? heat stroke  . Lung cancer Sister        "Asian lung cancer"  . Stroke Neg Hx      Social History   Social History  . Marital status: Married    Spouse name: N/A  . Number of children: N/A  . Years of education: N/A   Occupational History  . Not on file.   Social History Main Topics  . Smoking status: Passive Smoke Exposure - Never Smoker  . Smokeless tobacco: Never Used  . Alcohol  use No  . Drug use: No  . Sexual activity: No   Other Topics Concern  . Not on file   Social History Narrative   Married 1957. 4 children 2 boys 2 girls. 15 grandkids.  4 greatgrandkids.       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 Retired from [employer] and worked 30 years.       Hobbies: genealogy, travel      No HCPOA-advised to do this.      BP 120/64   Pulse 88   Ht 5\' 11"  (1.803 m)   Wt 246 lb (111.6 kg)   SpO2 97%   BMI 34.31 kg/m   Physical Exam:  stable appearing 83 year old man, NAD HEENT: Unremarkable Neck:  7 cm JVD, no thyromegally Lungs:  Clear with no wheezes, rales, or rhonchi. HEART:  Regular rate rhythm, no murmurs, no rubs, no clicks Abd:  soft, obese, positive bowel sounds, no organomegally, no rebound, no guarding Ext:  2 plus pulses, no edema, no cyanosis, no clubbing Skin:  No rashes no nodules Neuro:  CN II through XII intact, motor grossly intact  DEVICE  Normal device function.  See PaceArt for details.   Assess/Plan:  1. Chronic systolic heart failure - his symptoms are class 3. I have asked the patient to start back taking his lasix.  2. ICD - his device is working normally. Will recheck in several months. 3. PVC's - he is mostly asymptomatic. Will follow. 4. Obesity - he admits to being sedentary and I have asked him to try and lose weight.  Mikle Bosworth.D.

## 2016-07-15 NOTE — Addendum Note (Signed)
Addended by: Campbell Riches on: 07/15/2016 07:17 AM   Modules accepted: Orders

## 2016-07-31 ENCOUNTER — Other Ambulatory Visit (HOSPITAL_COMMUNITY): Payer: Self-pay | Admitting: Cardiology

## 2016-07-31 ENCOUNTER — Ambulatory Visit (HOSPITAL_COMMUNITY)
Admission: RE | Admit: 2016-07-31 | Discharge: 2016-07-31 | Disposition: A | Discharge status: Home / Self Care | Payer: Medicare Other | Source: Ambulatory Visit | Attending: Internal Medicine | Admitting: Internal Medicine

## 2016-07-31 VITALS — BP 141/81 | HR 87 | Wt 243.8 lb

## 2016-07-31 DIAGNOSIS — Z6834 Body mass index (BMI) 34.0-34.9, adult: Secondary | ICD-10-CM | POA: Insufficient documentation

## 2016-07-31 DIAGNOSIS — I2510 Atherosclerotic heart disease of native coronary artery without angina pectoris: Secondary | ICD-10-CM | POA: Diagnosis not present

## 2016-07-31 DIAGNOSIS — I255 Ischemic cardiomyopathy: Secondary | ICD-10-CM | POA: Diagnosis not present

## 2016-07-31 DIAGNOSIS — E669 Obesity, unspecified: Secondary | ICD-10-CM | POA: Insufficient documentation

## 2016-07-31 DIAGNOSIS — I110 Hypertensive heart disease with heart failure: Secondary | ICD-10-CM | POA: Insufficient documentation

## 2016-07-31 DIAGNOSIS — I252 Old myocardial infarction: Secondary | ICD-10-CM | POA: Diagnosis not present

## 2016-07-31 DIAGNOSIS — E785 Hyperlipidemia, unspecified: Secondary | ICD-10-CM | POA: Diagnosis not present

## 2016-07-31 DIAGNOSIS — Z7982 Long term (current) use of aspirin: Secondary | ICD-10-CM | POA: Insufficient documentation

## 2016-07-31 DIAGNOSIS — Z951 Presence of aortocoronary bypass graft: Secondary | ICD-10-CM | POA: Diagnosis not present

## 2016-07-31 DIAGNOSIS — I5022 Chronic systolic (congestive) heart failure: Secondary | ICD-10-CM | POA: Insufficient documentation

## 2016-07-31 DIAGNOSIS — I10 Essential (primary) hypertension: Secondary | ICD-10-CM | POA: Diagnosis not present

## 2016-07-31 DIAGNOSIS — Z4502 Encounter for adjustment and management of automatic implantable cardiac defibrillator: Secondary | ICD-10-CM | POA: Insufficient documentation

## 2016-07-31 DIAGNOSIS — Z79899 Other long term (current) drug therapy: Secondary | ICD-10-CM | POA: Insufficient documentation

## 2016-07-31 MED ORDER — SIMVASTATIN 40 MG PO TABS: 20.0000 mg | ORAL_TABLET | Freq: Every day | ORAL | 3 refills | Status: DC

## 2016-07-31 MED ORDER — SPIRONOLACTONE 25 MG PO TABS: 12.5000 mg | ORAL_TABLET | Freq: Every day | ORAL | 3 refills | Status: DC

## 2016-07-31 NOTE — Patient Instructions (Signed)
Start Spironolactone 12.5 mg (1/2 tab) daily  Labs in 1 week and again in 4 weeks  We will contact you in 3 months to schedule your next appointment.

## 2016-07-31 NOTE — Progress Notes (Addendum)
Patient ID: Joseph Malone, male   DOB: 1931-01-30, 83 y.o.   MRN: [id number]  CARDIOLOGY CLINIC NOTE PCP: Garret Reddish  Patient ID: Joseph Malone, male   DOB: [birth year], 83 y.o.   MRN: [id number] HPI: Famous  is a pleasant 83 year old male with a history of coronary artery disease status post large anterior MI with coronary  bypass grafting in 2001 with a LIMA to the LAD.  He has history of  congestive heart failure secondary to resultant ischemic cardiomyopathy,  ejection fraction however is in the 35-40% (08/2005).  He is status post  single-chamber ICD.  He did have a nuclear study in March 01, 2006, which showed an EF of 38% a large anterior infarction, but no ischemia. ETT 11/2009 was normal (8:54min on Bruce)  The rest of his medical history is notable for obesity, hypertension,  hyperlipidemia, and glucose intolerance.  Today he returns for HF follow up. Saw Dr Lovena Le recently and had some volume overload. He was started on 40 mg lasix daily. Has had episodes of SBP in 180s. SOB with exertion but says its a little better. Appetite ok. Walking at home. No swelling in his legs. Taking all medications.  Marland Kitchen ECHO 06/2016 EF 35-40%   Echo 12/14 EF 30-35% + ICD (followed by Dr. Lovena Le) - ICD gen chang 06/01/13 . ROS: All systems negative except as listed in HPI, PMH and Problem List.  Past Medical History:  Diagnosis Date  . BPH (benign prostatic hypertrophy)   . CAD (coronary artery disease)    s/p CABG  . Diverticulosis   . Diverticulosis of colon without hemorrhage 10/22/2007   Qualifier: Diagnosis of  By: Linna Darner MD, [name]    . Excess weight   . Fasting hyperglycemia   . Heart attack ([place]) 2001  . Heart failure    CHF due to ischemic CM  . Hyperlipidemia   . Hypertension   . Ischemic cardiomyopathy   . Microscopic hematuria    [facility]    Current Outpatient Prescriptions  Medication Sig Dispense Refill  . acetaminophen (TYLENOL) 500 MG tablet Take 500 mg by mouth at  bedtime.     Marland Kitchen aspirin 81 MG tablet Take 1 tablet (81 mg total) by mouth every evening.    . carvedilol (COREG) 25 MG tablet Take 0.5 tablets (12.5 mg total) by mouth 2 (two) times daily. 180 tablet 3  . Cholecalciferol (VITAMIN D3) 2000 UNITS TABS Take 2,000 Units by mouth every evening.     . folic acid (FOLVITE) 177 MCG tablet Take 400 mcg by mouth daily.      . furosemide (LASIX) 40 MG tablet Take 1 tablet (40 mg total) by mouth daily. 90 tablet 3  . Glucosamine HCl 1500 MG TABS Take 1,500 mg by mouth every evening.     Marland Kitchen losartan (COZAAR) 50 MG tablet Take 1 tablet (50 mg total) by mouth daily. 90 tablet 3  . Multiple Vitamin (MULTIVITAMIN WITH MINERALS) TABS Take 1 tablet by mouth daily.     . potassium chloride SA (K-DUR,KLOR-CON) 20 MEQ tablet Take 1 tablet (20 mEq total) by mouth daily. 90 tablet 3  . simvastatin (ZOCOR) 40 MG tablet Take 0.5 tablets (20 mg total) by mouth at bedtime. 90 tablet 3   No current facility-administered medications for this encounter.    Vitals:   07/31/16 1443  BP: (!) 141/81  Pulse: 87  SpO2: 93%  Weight: 243 lb 12.8 oz (110.6 kg)    PHYSICAL EXAM:  General:  Well appearing. No resp difficulty HEENT: normal except knot on L forehead.  Neck: supple. JVP 5-6 JVD. Carotids 2+ bilat; no bruits. No lymphadenopathy or thryomegaly appreciated. Cor: PMI nondisplaced. Regular rate & rhythm. No rubs, gallops or murmurs. Lungs: clear Abdomen: soft, nontender, nondistended. No hepatosplenomegaly. No bruits or masses. Good bowel sounds. Extremities: no cyanosis, clubbing, rash, edema Neuro: alert & orientedx3, cranial nerves grossly intact. moves all 4 extremities w/o difficulty. Affect pleasant  ASSESSMENT & PLAN: 1) CAD: s/p CABG 2001 - No s/s of ischemia. Will continue medical management (BB, statin, and ASA) - Continue  ASA to 83 2) Chronic systolic HF: ICM, ECHO 97-94%. 06/2016. medtroinc ICD.  NYHA III. Optivol- Well below threshold. Activity  ~1hours.   - Volume status improved with  40 mg po lasix. Add 12.5 mg spiro daily.  Continue coreg 12.5 mg twice a day + losartan 50 mg daily.  Check BMET next week.  3) HTN A little higher today. Continue current regimen. Consider adding 12.5 mg spiro daily.  4) HLD - managed by PCP. Continue statin  Check BMET in 7 days. Discussed possible side effect from spiro.   Amy Clegg NP-C 3:02 PM   Patient seen and examined with Darrick Grinder, NP. We discussed all aspects of the encounter. I agree with the assessment and plan as stated above.   Overall doing well but recently BP and volume status up. Volume status improved with restarting lasix.  Agree with addition of spiro 12.5mg  daily. Can adjust lasix as needed. Check BMET in 1 week and 1 month.   ICD interrogated personally. No VT. Volume status ok.   CAD stable. No s/s of ischemia.   Glori Bickers, MD  6:18 PM

## 2016-07-31 NOTE — Progress Notes (Signed)
Advanced Heart Failure Medication Review by a Pharmacist  Does the patient  feel that his/her medications are working for him/her?  yes  Has the patient been experiencing any side effects to the medications prescribed?  no  Does the patient measure his/her own blood pressure or blood glucose at home?  Yes, ~130/75   Does the patient have any problems obtaining medications due to transportation or finances?   no  Understanding of regimen: good Understanding of indications: good Potential of compliance: good Patient understands to avoid NSAIDs. Patient understands to avoid decongestants.  Issues to address at subsequent visits: none.   Pharmacist comments: Joseph Malone is pleasant 83 y/o male who presents with his medication list. He endorses good adherence to his medications and had no medication-related questions or concerns for me at this time.   Phillis Knack PharmD Candidate  Time with patient: 10 minutes Preparation and documentation time: 10 minutes Total time: 20 minutes

## 2016-08-08 ENCOUNTER — Other Ambulatory Visit (HOSPITAL_COMMUNITY): Payer: Self-pay | Admitting: Cardiology

## 2016-08-08 ENCOUNTER — Ambulatory Visit (HOSPITAL_COMMUNITY)
Admission: RE | Admit: 2016-08-08 | Discharge: 2016-08-08 | Disposition: A | Discharge status: Home / Self Care | Payer: Medicare Other | Source: Ambulatory Visit | Attending: Cardiology | Admitting: Cardiology

## 2016-08-08 DIAGNOSIS — I5022 Chronic systolic (congestive) heart failure: Secondary | ICD-10-CM

## 2016-08-08 LAB — BASIC METABOLIC PANEL
Anion gap: 8 (ref 5–15)
BUN: 22 mg/dL — AB (ref 6–20)
CHLORIDE: 107 mmol/L (ref 101–111)
CO2: 24 mmol/L (ref 22–32)
CREATININE: 1.27 mg/dL — AB (ref 0.61–1.24)
Calcium: 9.7 mg/dL (ref 8.9–10.3)
GFR calc Af Amer: 58 mL/min — ABNORMAL LOW (ref >60)
GFR calc non Af Amer: 50 mL/min — ABNORMAL LOW (ref >60)
GLUCOSE: 185 mg/dL — AB (ref 65–99)
POTASSIUM: 4.3 mmol/L (ref 3.5–5.1)
Sodium: 139 mmol/L (ref 135–145)

## 2016-08-08 MED ORDER — CARVEDILOL 25 MG PO TABS: 12.5000 mg | ORAL_TABLET | Freq: Two times a day (BID) | ORAL | 3 refills | Status: DC

## 2016-08-18 ENCOUNTER — Other Ambulatory Visit: Payer: Self-pay | Admitting: Internal Medicine

## 2016-08-28 ENCOUNTER — Ambulatory Visit (HOSPITAL_COMMUNITY)
Admission: RE | Admit: 2016-08-28 | Discharge: 2016-08-28 | Disposition: A | Discharge status: Home / Self Care | Payer: Medicare Other | Source: Ambulatory Visit | Attending: Cardiology | Admitting: Cardiology

## 2016-08-28 DIAGNOSIS — I5022 Chronic systolic (congestive) heart failure: Secondary | ICD-10-CM | POA: Diagnosis not present

## 2016-08-28 LAB — BASIC METABOLIC PANEL
ANION GAP: 9 (ref 5–15)
BUN: 28 mg/dL — ABNORMAL HIGH (ref 6–20)
CALCIUM: 9.3 mg/dL (ref 8.9–10.3)
CO2: 23 mmol/L (ref 22–32)
Chloride: 107 mmol/L (ref 101–111)
Creatinine, Ser: 1.36 mg/dL — ABNORMAL HIGH (ref 0.61–1.24)
GFR calc Af Amer: 53 mL/min — ABNORMAL LOW (ref >60)
GFR, EST NON AFRICAN AMERICAN: 46 mL/min — AB (ref >60)
Glucose, Bld: 228 mg/dL — ABNORMAL HIGH (ref 65–99)
POTASSIUM: 4 mmol/L (ref 3.5–5.1)
SODIUM: 139 mmol/L (ref 135–145)

## 2016-09-23 ENCOUNTER — Telehealth (HOSPITAL_COMMUNITY): Payer: Self-pay

## 2016-09-23 NOTE — Telephone Encounter (Signed)
Patient called to verify whether to continue taking both diuretics (lasix and spiro). Advised per recent labs and notes yes to continue taking as prescribed. Also due to be seen back next month for his 3 month f/u, scheduled for 10/3 at 11:40. Patient aware and agreeable to plans as stated above and aware to call CHF clinic for any further questions/cocnerns.  Renee Pain, RN

## 2016-10-01 ENCOUNTER — Ambulatory Visit (INDEPENDENT_AMBULATORY_CARE_PROVIDER_SITE_OTHER): Payer: Medicare Other | Admitting: *Deleted

## 2016-10-01 DIAGNOSIS — I255 Ischemic cardiomyopathy: Secondary | ICD-10-CM | POA: Diagnosis not present

## 2016-10-01 NOTE — Progress Notes (Signed)
Remote ICD transmission.   

## 2016-10-03 LAB — CUP PACEART REMOTE DEVICE CHECK
Battery Remaining Longevity: 112 mo
Date Time Interrogation Session: 20180822062704
HighPow Impedance: 79 Ohm
Implantable Lead Implant Date: 20150422
Implantable Pulse Generator Implant Date: 20150422
Lead Channel Impedance Value: 475 Ohm
Lead Channel Pacing Threshold Amplitude: 0.5 V
Lead Channel Pacing Threshold Pulse Width: 0.4 ms
Lead Channel Sensing Intrinsic Amplitude: 15 mV
Lead Channel Setting Pacing Amplitude: 2 V
Lead Channel Setting Pacing Pulse Width: 0.4 ms
MDC IDC LEAD LOCATION: 753860
MDC IDC MSMT BATTERY VOLTAGE: 3.01 V
MDC IDC MSMT LEADCHNL RV IMPEDANCE VALUE: 532 Ohm
MDC IDC MSMT LEADCHNL RV SENSING INTR AMPL: 15 mV
MDC IDC SET LEADCHNL RV SENSING SENSITIVITY: 0.3 mV
MDC IDC STAT BRADY RV PERCENT PACED: 0.01 %

## 2016-10-10 ENCOUNTER — Encounter: Payer: Self-pay | Admitting: Cardiology

## 2016-10-24 ENCOUNTER — Encounter: Payer: Self-pay | Admitting: Cardiology

## 2016-11-05 DIAGNOSIS — N200 Calculus of kidney: Secondary | ICD-10-CM | POA: Diagnosis not present

## 2016-11-05 DIAGNOSIS — N201 Calculus of ureter: Secondary | ICD-10-CM | POA: Diagnosis not present

## 2016-11-05 DIAGNOSIS — R310 Gross hematuria: Secondary | ICD-10-CM | POA: Diagnosis not present

## 2016-11-11 DIAGNOSIS — N201 Calculus of ureter: Secondary | ICD-10-CM | POA: Diagnosis not present

## 2016-11-12 ENCOUNTER — Ambulatory Visit (HOSPITAL_COMMUNITY)
Admission: RE | Admit: 2016-11-12 | Discharge: 2016-11-12 | Disposition: A | Discharge status: Home / Self Care | Payer: Medicare Other | Source: Ambulatory Visit | Attending: Internal Medicine | Admitting: Internal Medicine

## 2016-11-12 ENCOUNTER — Encounter (HOSPITAL_COMMUNITY): Payer: Self-pay | Admitting: Internal Medicine

## 2016-11-12 VITALS — BP 133/65 | HR 76 | Wt 245.2 lb

## 2016-11-12 DIAGNOSIS — Z95810 Presence of automatic (implantable) cardiac defibrillator: Secondary | ICD-10-CM | POA: Insufficient documentation

## 2016-11-12 DIAGNOSIS — Z7982 Long term (current) use of aspirin: Secondary | ICD-10-CM | POA: Insufficient documentation

## 2016-11-12 DIAGNOSIS — I2510 Atherosclerotic heart disease of native coronary artery without angina pectoris: Secondary | ICD-10-CM | POA: Diagnosis not present

## 2016-11-12 DIAGNOSIS — I110 Hypertensive heart disease with heart failure: Secondary | ICD-10-CM | POA: Diagnosis not present

## 2016-11-12 DIAGNOSIS — E785 Hyperlipidemia, unspecified: Secondary | ICD-10-CM | POA: Diagnosis not present

## 2016-11-12 DIAGNOSIS — Z79899 Other long term (current) drug therapy: Secondary | ICD-10-CM | POA: Diagnosis not present

## 2016-11-12 DIAGNOSIS — N200 Calculus of kidney: Secondary | ICD-10-CM | POA: Insufficient documentation

## 2016-11-12 DIAGNOSIS — I255 Ischemic cardiomyopathy: Secondary | ICD-10-CM | POA: Diagnosis not present

## 2016-11-12 DIAGNOSIS — N400 Enlarged prostate without lower urinary tract symptoms: Secondary | ICD-10-CM | POA: Diagnosis not present

## 2016-11-12 DIAGNOSIS — I10 Essential (primary) hypertension: Secondary | ICD-10-CM | POA: Diagnosis not present

## 2016-11-12 DIAGNOSIS — I252 Old myocardial infarction: Secondary | ICD-10-CM | POA: Diagnosis not present

## 2016-11-12 DIAGNOSIS — I5022 Chronic systolic (congestive) heart failure: Secondary | ICD-10-CM | POA: Insufficient documentation

## 2016-11-12 DIAGNOSIS — Z951 Presence of aortocoronary bypass graft: Secondary | ICD-10-CM | POA: Insufficient documentation

## 2016-11-12 LAB — BASIC METABOLIC PANEL
ANION GAP: 9 (ref 5–15)
BUN: 25 mg/dL — ABNORMAL HIGH (ref 6–20)
CO2: 24 mmol/L (ref 22–32)
Calcium: 9.3 mg/dL (ref 8.9–10.3)
Chloride: 103 mmol/L (ref 101–111)
Creatinine, Ser: 1.53 mg/dL — ABNORMAL HIGH (ref 0.61–1.24)
GFR calc Af Amer: 46 mL/min — ABNORMAL LOW (ref >60)
GFR calc non Af Amer: 40 mL/min — ABNORMAL LOW (ref >60)
GLUCOSE: 193 mg/dL — AB (ref 65–99)
POTASSIUM: 3.4 mmol/L — AB (ref 3.5–5.1)
Sodium: 136 mmol/L (ref 135–145)

## 2016-11-12 NOTE — Patient Instructions (Signed)
Labs drawn today (if we do not call you, then your lab work was stable)   Your physician recommends that you schedule a follow-up appointment in: 4 months   

## 2016-11-12 NOTE — Progress Notes (Signed)
Patient ID: Joseph Malone, male   DOB: 28-Sep-1930, 83 y.o.   MRN: [id number]  CARDIOLOGY CLINIC NOTE PCP: Garret Reddish  Patient ID: Joseph Malone, male   DOB: Jan 03, 1931, 83 y.o.   MRN: [id number] HPI: Joseph Malone  is a pleasant 83 year old male with a history of coronary artery disease status post large anterior MI with coronary  bypass grafting in 2001 with a LIMA to the LAD.  He has history of  congestive heart failure secondary to resultant ischemic cardiomyopathy,  ejection fraction however is in the 35-40% (08/2005).  He is status post  single-chamber ICD.  He did have a nuclear study in March 01, 2006, which showed an EF of 38% a large anterior infarction, but no ischemia. ETT 11/2009 was normal (8:31min on Bruce)  The rest of his medical history is notable for obesity, hypertension,  hyperlipidemia, and glucose intolerance.  He presents today for regular follow up. Arlyce Harman added at last visit. Has been feeling good overall. Remains SOB with showering. At times he can walk 20-30 minutes on flat ground without difficulty. Denies orthopnea or PND. Has intermittent atypical chest pain, comes and goes unrelated to exertion. Denies peripheral edema. Denies lightheadedness or dizziness. Taking lasix 40 mg daily at 4-7 times a week. Skips a dose if he is going out or has a long car ride. BP stable at home. No further hypertensive episodes. Running in 130s prior to medications, 120s after. Had a bout of kidney stones last week.   ECHO 06/2016 EF 35-40%   Echo 12/14 EF 30-35% + ICD (followed by Dr. Lovena Le) - ICD gen chang 06/01/13  Optivol: Thoracic impedence up. His fluid goes up and down. Pt activity ~1-2 hrs daily.  Review of systems complete and found to be negative unless listed in HPI.    Past Medical History:  Diagnosis Date  . BPH (benign prostatic hypertrophy)   . CAD (coronary artery disease)    s/p CABG  . Diverticulosis   . Diverticulosis of colon without hemorrhage 10/22/2007   Qualifier:  Diagnosis of  By: Linna Darner MD, [name]    . Excess weight   . Fasting hyperglycemia   . Heart attack ([place]) 2001  . Heart failure    CHF due to ischemic CM  . Hyperlipidemia   . Hypertension   . Ischemic cardiomyopathy   . Microscopic hematuria    [facility]    Current Outpatient Prescriptions  Medication Sig Dispense Refill  . acetaminophen (TYLENOL) 500 MG tablet Take 500 mg by mouth at bedtime.     Marland Kitchen aspirin 81 MG tablet Take 1 tablet (81 mg total) by mouth every evening.    . carvedilol (COREG) 25 MG tablet Take 0.5 tablets (12.5 mg total) by mouth 2 (two) times daily. 180 tablet 3  . Cholecalciferol (VITAMIN D3) 2000 UNITS TABS Take 2,000 Units by mouth every evening.     . folic acid (FOLVITE) 259 MCG tablet Take 400 mcg by mouth daily.      . furosemide (LASIX) 40 MG tablet Take 1 tablet (40 mg total) by mouth daily. 90 tablet 3  . Glucosamine HCl 1500 MG TABS Take 1,500 mg by mouth every evening.     Marland Kitchen losartan (COZAAR) 50 MG tablet Take 1 tablet (50 mg total) by mouth daily. 90 tablet 3  . Multiple Vitamin (MULTIVITAMIN WITH MINERALS) TABS Take 1 tablet by mouth daily.     . potassium chloride SA (K-DUR,KLOR-CON) 20 MEQ tablet Take 1 tablet (20  mEq total) by mouth daily. 90 tablet 3  . simvastatin (ZOCOR) 40 MG tablet Take 0.5 tablets (20 mg total) by mouth at bedtime. 90 tablet 3  . spironolactone (ALDACTONE) 25 MG tablet Take 0.5 tablets (12.5 mg total) by mouth daily. 15 tablet 3   No current facility-administered medications for this encounter.    Vitals:   11/12/16 1143  BP: 133/65  Pulse: 76  SpO2: 95%  Weight: 245 lb 4 oz (111.2 kg)   Wt Readings from Last 3 Encounters:  11/12/16 245 lb 4 oz (111.2 kg)  07/31/16 243 lb 12.8 oz (110.6 kg)  07/02/16 246 lb (111.6 kg)    PHYSICAL EXAM: General: Elderly appearing. No resp difficulty. HEENT: Normal except for L forehead knot.  Neck: Supple. JVP 5-6. Carotids 2+ bilat; no bruits. No thyromegaly or nodule  noted. Cor: PMI nondisplaced. RRR, No M/G/R noted Lungs: CTAB, normal effort. Abdomen: Soft, non-tender, non-distended, no HSM. No bruits or masses. +BS  Extremities: No cyanosis, clubbing, rash, R and LLE no edema.  Neuro: Alert & orientedx3, cranial nerves grossly intact. moves all 4 extremities w/o difficulty. Affect pleasant   ASSESSMENT & PLAN: 1) CAD: s/p CABG 2001 - No s/s of ischemia. Will continue medical management (BB, statin, and ASA) - Continue ASA to 83 2) Chronic systolic HF: ICM, ECHO 33-35%. 06/2016. medtroinc ICD.  NYHA class III symptoms - Optivol with thoracic impedence correction over last week. Active 1-2 hrs daily.  - Volume status stable on lasix 40 mg daily.  - Continue 12.5 mg spiro daily.  - Continue coreg 12.5 mg twice a day + losartan 50 mg daily.  3) HTN - Mildly elevated. Meds as above.  4) HLD - managed by PCP. Continue statin. No change.   Will keep on current meds for now. Encouraged more regular lasix usage. (taking as few as 4 times a week). BMET today. RTC 4 months.  Shirley Friar, PA-C  12:16 PM    Patient seen and examined with the above-signed Advanced Practice Provider and/or Housestaff. I personally reviewed laboratory data, imaging studies and relevant notes. I independently examined the patient and formulated the important aspects of the plan. I have edited the note to reflect any of my changes or salient points. I have personally discussed the plan with the patient and/or family.  Doing well. Stable NYHA II-III symptoms. Volume status looks good on exam and by Optivol - was up previously and now done (Personally reviewed). No s/s ischemia. Ideally would like to switch him to [facility] but BP has not been able to tolerate med titration may try low dose 24/26 at next visit.   Glori Bickers, MD  11:39 AM

## 2016-11-26 DIAGNOSIS — N200 Calculus of kidney: Secondary | ICD-10-CM | POA: Diagnosis not present

## 2016-12-02 ENCOUNTER — Other Ambulatory Visit: Payer: Self-pay

## 2016-12-16 ENCOUNTER — Other Ambulatory Visit (HOSPITAL_COMMUNITY): Payer: Self-pay | Admitting: *Deleted

## 2016-12-16 MED ORDER — POTASSIUM CHLORIDE CRYS ER 20 MEQ PO TBCR: 20.0000 meq | EXTENDED_RELEASE_TABLET | Freq: Every day | ORAL | 3 refills | Status: DC

## 2016-12-31 ENCOUNTER — Ambulatory Visit (INDEPENDENT_AMBULATORY_CARE_PROVIDER_SITE_OTHER): Payer: Medicare Other | Admitting: *Deleted

## 2016-12-31 DIAGNOSIS — I255 Ischemic cardiomyopathy: Secondary | ICD-10-CM | POA: Diagnosis not present

## 2016-12-31 NOTE — Progress Notes (Signed)
Remote ICD transmission.   

## 2017-01-09 ENCOUNTER — Encounter: Payer: Self-pay | Admitting: Cardiology

## 2017-01-20 LAB — CUP PACEART REMOTE DEVICE CHECK
Battery Remaining Longevity: 109 mo
HighPow Impedance: 83 Ohm
Implantable Lead Location: 753860
Implantable Lead Model: 6935
Lead Channel Impedance Value: 475 Ohm
Lead Channel Impedance Value: 532 Ohm
Lead Channel Pacing Threshold Amplitude: 0.625 V
Lead Channel Setting Pacing Pulse Width: 0.4 ms
Lead Channel Setting Sensing Sensitivity: 0.3 mV
MDC IDC LEAD IMPLANT DT: 20150422
MDC IDC MSMT BATTERY VOLTAGE: 3.01 V
MDC IDC MSMT LEADCHNL RV PACING THRESHOLD PULSEWIDTH: 0.4 ms
MDC IDC MSMT LEADCHNL RV SENSING INTR AMPL: 16.75 mV
MDC IDC MSMT LEADCHNL RV SENSING INTR AMPL: 16.75 mV
MDC IDC PG IMPLANT DT: 20150422
MDC IDC SESS DTM: 20181121123725
MDC IDC SET LEADCHNL RV PACING AMPLITUDE: 2 V
MDC IDC STAT BRADY RV PERCENT PACED: 0.01 %

## 2017-01-26 ENCOUNTER — Other Ambulatory Visit (HOSPITAL_COMMUNITY): Payer: Self-pay | Admitting: Internal Medicine

## 2017-01-27 ENCOUNTER — Other Ambulatory Visit (HOSPITAL_COMMUNITY): Payer: Self-pay | Admitting: *Deleted

## 2017-01-27 MED ORDER — SPIRONOLACTONE 25 MG PO TABS: ORAL_TABLET | ORAL | 3 refills | Status: DC

## 2017-03-20 DIAGNOSIS — B353 Tinea pedis: Secondary | ICD-10-CM | POA: Diagnosis not present

## 2017-03-20 DIAGNOSIS — D225 Melanocytic nevi of trunk: Secondary | ICD-10-CM | POA: Diagnosis not present

## 2017-03-20 DIAGNOSIS — L570 Actinic keratosis: Secondary | ICD-10-CM | POA: Diagnosis not present

## 2017-03-20 DIAGNOSIS — X32XXXD Exposure to sunlight, subsequent encounter: Secondary | ICD-10-CM | POA: Diagnosis not present

## 2017-04-01 ENCOUNTER — Ambulatory Visit (INDEPENDENT_AMBULATORY_CARE_PROVIDER_SITE_OTHER): Payer: Medicare Other | Admitting: *Deleted

## 2017-04-01 DIAGNOSIS — I255 Ischemic cardiomyopathy: Secondary | ICD-10-CM | POA: Diagnosis not present

## 2017-04-01 NOTE — Progress Notes (Signed)
Remote ICD transmission.   

## 2017-04-02 ENCOUNTER — Encounter: Payer: Self-pay | Admitting: Cardiology

## 2017-04-06 ENCOUNTER — Other Ambulatory Visit (HOSPITAL_COMMUNITY): Payer: Self-pay | Admitting: *Deleted

## 2017-04-06 MED ORDER — LOSARTAN POTASSIUM 50 MG PO TABS: 50.0000 mg | ORAL_TABLET | Freq: Every day | ORAL | 3 refills | Status: DC

## 2017-04-08 LAB — CUP PACEART REMOTE DEVICE CHECK
Battery Remaining Longevity: 108 mo
Battery Voltage: 3.01 V
HighPow Impedance: 85 Ohm
Implantable Lead Implant Date: 20150422
Implantable Lead Location: 753860
Lead Channel Impedance Value: 418 Ohm
Lead Channel Pacing Threshold Amplitude: 0.5 V
Lead Channel Sensing Intrinsic Amplitude: 15.125 mV
Lead Channel Setting Pacing Pulse Width: 0.4 ms
MDC IDC MSMT LEADCHNL RV IMPEDANCE VALUE: 513 Ohm
MDC IDC MSMT LEADCHNL RV PACING THRESHOLD PULSEWIDTH: 0.4 ms
MDC IDC MSMT LEADCHNL RV SENSING INTR AMPL: 15.125 mV
MDC IDC PG IMPLANT DT: 20150422
MDC IDC SESS DTM: 20190220082202
MDC IDC SET LEADCHNL RV PACING AMPLITUDE: 2 V
MDC IDC SET LEADCHNL RV SENSING SENSITIVITY: 0.3 mV
MDC IDC STAT BRADY RV PERCENT PACED: 0.02 %

## 2017-04-29 ENCOUNTER — Ambulatory Visit: Payer: Self-pay

## 2017-04-29 NOTE — Telephone Encounter (Signed)
Please advise 

## 2017-04-29 NOTE — Telephone Encounter (Signed)
Called and spoke with patient. I offered an appointment with Dr. Jerline Pain tomorrow morning at 8am but patient refused. He states he only wants to see Dr. Yong Channel is scheduled on 05/12/17. He states this has been going on for a few weeks. I did advise him if symptoms worsened to seek care immediately. He verbalized understnading

## 2017-04-29 NOTE — Telephone Encounter (Signed)
rec'd phone call from pt.  C/o shortness of breath and weakness that has been going on x 2 weeks.  Reported he is very tired and gets short of breath with activity.  Stated not worsening over past 2 weeks, but not better.  Reported he has been evaluated by his Cardiologist, and was advised these symptoms are not caused by his heart.  Reported he was given Lasix about one month ago, for treatment of fluid around his joints, and has noticed that the ability to walk, and stand up more quickly/ easily, has improved.  Denied any improvement with his breathing, since starting Lasix.  Denied cough or swelling of feet/ ankles.  Stated he is used to walking and working out at [employer], but is not able to tolerate it now.  Reported if he walks 1/4 mile, he gets "completely exhausted." Ques. about chest pain.  Reported he had open heart surgery in 1999, and has been having chest pain for several years, with activity.  Denied using Ntg. for the chest pain.  Reported if he drinks a carbonated beverage, and he can burp, the chest pain goes away. Advised to schedule an appt. at PCP office; stated prefers to only see Dr. Yong Channel.  No available openings with Dr. Yong Channel prior to 4/2.  The pt. Requested to schedule 4/2, and will call back if symptoms worsen.   Advised to call if increased shortness of breath, cough, swelling in feet and ankles, increased chest pain, radiation of chest pain, or any other concerns, prior to the 4/2 appt.  Verb. Understanding.            Reason for Disposition . [1] MODERATE weakness (i.e., interferes with work, school, normal activities) AND [2] persists > 3 days  Answer Assessment - Initial Assessment Questions 1. DESCRIPTION: "Describe how you are feeling."     C/o shortness of breath; Feels more weak in his chest ; Cardiologist feels it is not his heart 2. SEVERITY: "How bad is it?"  "Can you stand and walk?"   - MILD - Feels weak or tired, but does not interfere with work, school or normal  activities   - [place] to stand and walk; weakness interferes with work, school, or normal activities   - SEVERE - Unable to stand or walk     If walks about 1/4 mile, gets completely exhausted 3. ONSET:  "When did the weakness begin?"     2-3 weeks 4. CAUSE: "What do you think is causing the weakness?"     unknown 5. MEDICINES: "Have you recently started a new medicine or had a change in the amount of a medicine?"     Lasix was started about 1 month ago 6. OTHER SYMPTOMS: "Do you have any other symptoms?" (e.g., chest pain, fever, cough, SOB, vomiting, diarrhea, bleeding)     SOB with activity; vacuuming, working in yard. Stated has had chest pain with activity; denies chest pain at rest; Mid-chest discomfort relieved with burping 7. PREGNANCY: "Is there any chance you are pregnant?" "When was your last menstrual period?"    N/a  Protocols used: WEAKNESS (GENERALIZED) AND FATIGUE-A-AH

## 2017-04-30 NOTE — Telephone Encounter (Signed)
Thanks for giving him good advice. I agree

## 2017-05-05 DIAGNOSIS — H43811 Vitreous degeneration, right eye: Secondary | ICD-10-CM | POA: Diagnosis not present

## 2017-05-05 DIAGNOSIS — H04123 Dry eye syndrome of bilateral lacrimal glands: Secondary | ICD-10-CM | POA: Diagnosis not present

## 2017-05-05 DIAGNOSIS — E119 Type 2 diabetes mellitus without complications: Secondary | ICD-10-CM | POA: Diagnosis not present

## 2017-05-05 DIAGNOSIS — Z961 Presence of intraocular lens: Secondary | ICD-10-CM | POA: Diagnosis not present

## 2017-05-05 LAB — HM DIABETES EYE EXAM

## 2017-05-07 ENCOUNTER — Encounter: Payer: Self-pay | Admitting: Family Medicine

## 2017-05-12 ENCOUNTER — Ambulatory Visit (INDEPENDENT_AMBULATORY_CARE_PROVIDER_SITE_OTHER): Payer: Medicare Other

## 2017-05-12 ENCOUNTER — Encounter: Payer: Self-pay | Admitting: Family Medicine

## 2017-05-12 ENCOUNTER — Ambulatory Visit (INDEPENDENT_AMBULATORY_CARE_PROVIDER_SITE_OTHER): Payer: Medicare Other | Admitting: Family Medicine

## 2017-05-12 VITALS — BP 128/70 | HR 56 | Temp 98.2°F | Ht 71.0 in | Wt 239.0 lb

## 2017-05-12 DIAGNOSIS — F329 Major depressive disorder, single episode, unspecified: Secondary | ICD-10-CM

## 2017-05-12 DIAGNOSIS — E1151 Type 2 diabetes mellitus with diabetic peripheral angiopathy without gangrene: Secondary | ICD-10-CM

## 2017-05-12 DIAGNOSIS — R4589 Other symptoms and signs involving emotional state: Secondary | ICD-10-CM

## 2017-05-12 DIAGNOSIS — R0602 Shortness of breath: Secondary | ICD-10-CM

## 2017-05-12 LAB — POCT GLYCOSYLATED HEMOGLOBIN (HGB A1C): HEMOGLOBIN A1C: 6.6

## 2017-05-12 LAB — COMPREHENSIVE METABOLIC PANEL
ALBUMIN: 4 g/dL (ref 3.5–5.2)
ALT: 20 U/L (ref 0–53)
AST: 19 U/L (ref 0–37)
Alkaline Phosphatase: 55 U/L (ref 39–117)
BUN: 21 mg/dL (ref 6–23)
CALCIUM: 9.4 mg/dL (ref 8.4–10.5)
CHLORIDE: 107 meq/L (ref 96–112)
CO2: 26 meq/L (ref 19–32)
Creatinine, Ser: 1.11 mg/dL (ref 0.40–1.50)
GFR: 66.88 mL/min (ref >60.00)
Glucose, Bld: 164 mg/dL — ABNORMAL HIGH (ref 70–99)
POTASSIUM: 4.5 meq/L (ref 3.5–5.1)
SODIUM: 140 meq/L (ref 135–145)
Total Bilirubin: 0.6 mg/dL (ref 0.2–1.2)
Total Protein: 6.9 g/dL (ref 6.0–8.3)

## 2017-05-12 LAB — TSH: TSH: 0.61 u[IU]/mL (ref 0.35–4.50)

## 2017-05-12 LAB — CBC
HEMATOCRIT: 44.6 % (ref 39.0–52.0)
Hemoglobin: 15.3 g/dL (ref 13.0–17.0)
MCHC: 34.3 g/dL (ref 30.0–36.0)
MCV: 93.8 fl (ref 78.0–100.0)
Platelets: 175 10*3/uL (ref 150.0–400.0)
RBC: 4.76 Mil/uL (ref 4.22–5.81)
RDW: 13.1 % (ref 11.5–15.5)
WBC: 7.8 10*3/uL (ref 4.0–10.5)

## 2017-05-12 NOTE — Progress Notes (Signed)
Subjective:  Joseph Malone is a 84 y.o. year old very pleasant male patient who presents for/with See problem oriented charting ROS- has had some shortness of breath. No chest pain. No swelling.    Past Medical History-  Patient Active Problem List   Diagnosis Date Noted  . Chronic systolic heart failure ([place]) 12/09/2010    Priority: High  . Cardiomyopathy, ischemic 10/02/2009    Priority: High  . Automatic implantable cardioverter-defibrillator in situ 01/16/2009    Priority: High  . DM (diabetes mellitus) type II, controlled, with peripheral vascular disorder ([place]) 09/14/2008    Priority: High  . CAD in native artery 09/25/2006    Priority: High  . Hyperlipidemia 04/22/2007    Priority: Medium  . Essential hypertension 09/25/2006    Priority: Medium  . Erectile dysfunction 01/19/2014    Priority: Low  . Basal cell cancer 09/29/2012    Priority: Low  . Solitary pulmonary nodule 07/12/2012    Priority: Low  . BPH without obstruction/lower urinary tract symptoms 10/22/2007    Priority: Low  . Osteoarthritis 09/25/2006    Priority: Low  . Hematuria 09/25/2006    Priority: Low  . Nephrolithiasis 03/24/2016    Medications- reviewed and updated Current Outpatient Medications  Medication Sig Dispense Refill  . acetaminophen (TYLENOL) 500 MG tablet Take 500 mg by mouth at bedtime.     Marland Kitchen aspirin 81 MG tablet Take 1 tablet (81 mg total) by mouth every evening.    . carvedilol (COREG) 25 MG tablet Take 0.5 tablets (12.5 mg total) by mouth 2 (two) times daily. 180 tablet 3  . Cholecalciferol (VITAMIN D3) 2000 UNITS TABS Take 2,000 Units by mouth every evening.     . folic acid (FOLVITE) 427 MCG tablet Take 400 mcg by mouth daily.      . furosemide (LASIX) 40 MG tablet Take 1 tablet (40 mg total) by mouth daily. 90 tablet 3  . Glucosamine HCl 1500 MG TABS Take 1,500 mg by mouth every evening.     Marland Kitchen losartan (COZAAR) 50 MG tablet Take 1 tablet (50 mg total) by mouth daily. 90 tablet  3  . Multiple Vitamin (MULTIVITAMIN WITH MINERALS) TABS Take 1 tablet by mouth daily.     . potassium chloride SA (K-DUR,KLOR-CON) 20 MEQ tablet Take 1 tablet (20 mEq total) daily by mouth. 90 tablet 3  . simvastatin (ZOCOR) 40 MG tablet Take 0.5 tablets (20 mg total) by mouth at bedtime. 90 tablet 3  . spironolactone (ALDACTONE) 25 MG tablet TAKE 1/2 TABLET(12.5 MG) BY MOUTH DAILY 45 tablet 3   No current facility-administered medications for this visit.     Objective: BP 128/70 (BP Location: Left Arm, Patient Position: Sitting, Cuff Size: Large)   Pulse (!) 56   Temp 98.2 F (36.8 C) (Oral)   Ht 5\' 11"  (1.803 m)   Wt 239 lb (108.4 kg)   SpO2 96%   BMI 33.33 kg/m  Gen: NAD, resting comfortably TM normal, oropharynx normal CV: irregularly irregular no murmurs rubs or gallops Lungs: CTAB no crackles, wheeze, rhonchi Abdomen: soft/nontender/nondistended/normal bowel sounds. No rebound or guarding.  Ext: no edema Skin: warm, dry  EKG: I do not see clear consistent p waves. Irregularly irregular rhythm with rate 75. Concern for atrial fibrillation. Normal axis, q waves in v1, v2 unchanged from previous. Otherwise no st or t wave changes.    Diabetic Foot Exam - Simple   Simple Foot Form Diabetic Foot exam was performed with the  following findings:  Yes 05/12/2017 11:59 AM  Visual Inspection No deformities, no ulcerations, no other skin breakdown bilaterally:  Yes Sensation Testing Intact to touch and monofilament testing bilaterally:  Yes Pulse Check Posterior Tibialis and Dorsalis pulse intact bilaterally:  Yes Comments Some thickened toenails     Assessment/Plan:  Shortness of breath S: Within last few months has been able to walk 30-45 minutes. 3 weeks ago started with shortness of breath with activity- can walk 30 minutes but slower and has to be level ground whereas previous could be hilly/have elevation. Has lower energy and doesn't even feel like walking- so hasnt  walked as much. No abnormal sweating, left arm or neck pain, dizziness.   Takes his lasix 2-3x a week. Compliant with spironolactone 12.5mg .   Has had chest pain every since his first heart attack- no worse recently since shortness of breath worsened. No recent weight gain- actually has lost a few lbs- states eating pretty well and smaller portions. Appetite down some.   Some congestion at night- minimal cough/congestion in the day. Using 2 pillows to sleep at baseline- hasnt increased lately. No increase in swelling.  A/P: 84 year old male with history of heart failure but does not appear fluid overloaded. Will get cbc, cmp, tsh, cxr, pro bnp. I am concerned his EKG represents atrial fibrillation. Cardiology was kind enough to move his 06/11/17 appointment up to 05/28/17. He is on aspirin- will ask their opinion on anticoagulation. Does not appear to need rate control- unless heart rate is getting up with exercise. Will send a message to cardiology team for their opinion on EKG ahead of time as well. I doubt this is related to his CAD but obviously they will evaluate him for that as well  Situational depressed mood S: Patient reports some recent Family issue/stressorss. PHQ9  Of 8 A/P:. Declines counseling or need for medication.  will return if not improved within a few weeks or if new symptoms.   DM (diabetes mellitus) type II, controlled, with peripheral vascular disorder ([place]) S: well controlled with diet and exercise luckily as has not had the best follow up since December 2017 CPE Lab Results  Component Value Date   HGBA1C 6.6 05/12/2017   HGBA1C 7.0 (H) 01/11/2016   HGBA1C 6.9 (H) 12/11/2015   A/P: continue diet/exercise control   Future Appointments  Date Time Provider [place]  05/28/2017 12:00 PM Bensimhon, Shaune Pascal, MD [facility] None  07/01/2017 10:10 AM [facility] DEVICE REMOTES [facility]  07/16/2017 10:45 AM [name], MD [facility]    Need to reinforce q6 month visits for diabetes  Lab/Order associations: Shortness of breath - Plan: EKG 12-Lead, TSH, CBC, Comprehensive metabolic panel, DG Chest 2 View, Pro b natriuretic peptide, CANCELED: Pro b natriuretic peptide  DM (diabetes mellitus) type II, controlled, with peripheral vascular disorder ([place]) - Plan: POCT HgB A1C  No orders of the defined types were placed in this encounter.   Return precautions advised.  Garret Reddish, MD

## 2017-05-12 NOTE — Patient Instructions (Signed)
Please stop by lab before you go-they will also do x-ray  We could not get through to cardiology yet- please give them a call to see if they can move up your appointment from may. I want you to see [place] immediately if symptoms worsen. Take it easy until they evaluate you and we get bloodwork back- stop anything that causes shortness of breath

## 2017-05-12 NOTE — Assessment & Plan Note (Signed)
S: well controlled with diet and exercise luckily as has not had the best follow up since December 2017 CPE Lab Results  Component Value Date   HGBA1C 6.6 05/12/2017   HGBA1C 7.0 (H) 01/11/2016   HGBA1C 6.9 (H) 12/11/2015   A/P: continue diet/exercise control

## 2017-05-13 LAB — PRO B NATRIURETIC PEPTIDE: NT-Pro BNP: 320 pg/mL (ref 0–486)

## 2017-05-14 ENCOUNTER — Encounter: Payer: Self-pay | Admitting: Family Medicine

## 2017-05-15 ENCOUNTER — Telehealth: Payer: Self-pay | Admitting: Family Medicine

## 2017-05-15 NOTE — Telephone Encounter (Signed)
Copied from [place] [id number]. Topic: Quick Communication - Lab Results >> May 14, 2017  4:54 PM Dark, Angelena Sole, CMA wrote: Called patient to inform them of 05/13/2017 lab results. When patient returns call, triage nurse may disclose results. Heart failure test showed no cause of shortness of breath.  >> May 15, 2017  8:38 AM Lennox Solders wrote: Please call patient back at [phone]

## 2017-05-15 NOTE — Telephone Encounter (Signed)
Pt. Given results. Verbalizes understanding. 

## 2017-05-28 ENCOUNTER — Ambulatory Visit (HOSPITAL_COMMUNITY)
Admission: RE | Admit: 2017-05-28 | Discharge: 2017-05-28 | Disposition: A | Discharge status: Home / Self Care | Payer: Medicare Other | Source: Ambulatory Visit | Attending: Internal Medicine | Admitting: Internal Medicine

## 2017-05-28 ENCOUNTER — Encounter (HOSPITAL_COMMUNITY): Payer: Self-pay | Admitting: Internal Medicine

## 2017-05-28 VITALS — BP 108/63 | HR 88

## 2017-05-28 DIAGNOSIS — I25119 Atherosclerotic heart disease of native coronary artery with unspecified angina pectoris: Secondary | ICD-10-CM | POA: Diagnosis not present

## 2017-05-28 DIAGNOSIS — E669 Obesity, unspecified: Secondary | ICD-10-CM | POA: Insufficient documentation

## 2017-05-28 DIAGNOSIS — I10 Essential (primary) hypertension: Secondary | ICD-10-CM | POA: Diagnosis not present

## 2017-05-28 DIAGNOSIS — I255 Ischemic cardiomyopathy: Secondary | ICD-10-CM | POA: Diagnosis not present

## 2017-05-28 DIAGNOSIS — R0609 Other forms of dyspnea: Secondary | ICD-10-CM | POA: Diagnosis not present

## 2017-05-28 DIAGNOSIS — R5383 Other fatigue: Secondary | ICD-10-CM | POA: Insufficient documentation

## 2017-05-28 DIAGNOSIS — R0600 Dyspnea, unspecified: Secondary | ICD-10-CM | POA: Diagnosis not present

## 2017-05-28 DIAGNOSIS — Z79899 Other long term (current) drug therapy: Secondary | ICD-10-CM | POA: Insufficient documentation

## 2017-05-28 DIAGNOSIS — Z951 Presence of aortocoronary bypass graft: Secondary | ICD-10-CM | POA: Insufficient documentation

## 2017-05-28 DIAGNOSIS — I5022 Chronic systolic (congestive) heart failure: Secondary | ICD-10-CM | POA: Insufficient documentation

## 2017-05-28 DIAGNOSIS — E785 Hyperlipidemia, unspecified: Secondary | ICD-10-CM | POA: Insufficient documentation

## 2017-05-28 DIAGNOSIS — N400 Enlarged prostate without lower urinary tract symptoms: Secondary | ICD-10-CM | POA: Insufficient documentation

## 2017-05-28 DIAGNOSIS — I252 Old myocardial infarction: Secondary | ICD-10-CM | POA: Diagnosis not present

## 2017-05-28 DIAGNOSIS — I2510 Atherosclerotic heart disease of native coronary artery without angina pectoris: Secondary | ICD-10-CM

## 2017-05-28 DIAGNOSIS — I110 Hypertensive heart disease with heart failure: Secondary | ICD-10-CM | POA: Insufficient documentation

## 2017-05-28 DIAGNOSIS — Z7982 Long term (current) use of aspirin: Secondary | ICD-10-CM | POA: Insufficient documentation

## 2017-05-28 DIAGNOSIS — Z95810 Presence of automatic (implantable) cardiac defibrillator: Secondary | ICD-10-CM | POA: Insufficient documentation

## 2017-05-28 NOTE — Progress Notes (Signed)
Patient ID: JOSEY FORCIER, male   DOB: 09-Aug-1930, 84 y.o.   MRN: [id number]  CARDIOLOGY CLINIC NOTE PCP: Garret Reddish  Patient ID: [name], male   DOB: November 08, 1930, 84 y.o.   MRN: [id number] HPI: Karina  is a pleasant 84 year old male with a history of coronary artery disease status post large anterior MI with coronary  bypass grafting in 2001 with a LIMA to the LAD.  He has history of  congestive heart failure secondary to resultant ischemic cardiomyopathy,  ejection fraction however is in the 35-40% (08/2005).  He is status post  single-chamber ICD.  He did have a nuclear study in March 01, 2006, which showed an EF of 38% a large anterior infarction, but no ischemia. ETT 11/2009 was normal (8:30min on [facility])  The rest of his medical history is notable for obesity, hypertension,  hyperlipidemia, and glucose intolerance.  He presents today for acute work-in visit due to increased SOB. Saw PCP about 2 weeks ago c/o increased dyspnea. Pro-BNP 320. CXR clear. Says it has increased over past year. More fatigued. Still walking on TM but gets tired easier and more fatigued. Has chronic chest soreness from CABG but no angina. (Had clear exertional angina prior to CABG) Weight is actually down and not up. No edema, orthopnea or PND. Denies heavy snoring or daytime sleepiness   ECHO 06/2016 EF 35-40%   Echo 12/14 EF 30-35% + ICD (followed by Dr. Lovena Le) - ICD gen chang 06/01/13  Optivol: Thoracic impedence up. His fluid goes up and down. Pt activity ~1-2 hrs daily.  Review of systems complete and found to be negative unless listed in HPI.    Past Medical History:  Diagnosis Date  . BPH (benign prostatic hypertrophy)   . CAD (coronary artery disease)    s/p CABG  . Diverticulosis   . Diverticulosis of colon without hemorrhage 10/22/2007   Qualifier: Diagnosis of  By: Linna Darner MD, [name]    . Excess weight   . Fasting hyperglycemia   . Heart attack ([place]) 2001  . Heart failure    CHF due to  ischemic CM  . Hyperlipidemia   . Hypertension   . Ischemic cardiomyopathy   . Microscopic hematuria    [facility]    Current Outpatient Medications  Medication Sig Dispense Refill  . acetaminophen (TYLENOL) 500 MG tablet Take 500 mg by mouth at bedtime.     Marland Kitchen aspirin 81 MG tablet Take 1 tablet (81 mg total) by mouth every evening.    . carvedilol (COREG) 25 MG tablet Take 0.5 tablets (12.5 mg total) by mouth 2 (two) times daily. 180 tablet 3  . Cholecalciferol (VITAMIN D3) 2000 UNITS TABS Take 2,000 Units by mouth every evening.     . folic acid (FOLVITE) 782 MCG tablet Take 400 mcg by mouth daily.      . furosemide (LASIX) 40 MG tablet Take 1 tablet (40 mg total) by mouth daily. 90 tablet 3  . Glucosamine HCl 1500 MG TABS Take 1,500 mg by mouth every evening.     Marland Kitchen losartan (COZAAR) 50 MG tablet Take 1 tablet (50 mg total) by mouth daily. 90 tablet 3  . Multiple Vitamin (MULTIVITAMIN WITH MINERALS) TABS Take 1 tablet by mouth daily.     . potassium chloride SA (K-DUR,KLOR-CON) 20 MEQ tablet Take 1 tablet (20 mEq total) daily by mouth. 90 tablet 3  . simvastatin (ZOCOR) 40 MG tablet Take 0.5 tablets (20 mg total) by mouth at bedtime.  90 tablet 3  . spironolactone (ALDACTONE) 25 MG tablet TAKE 1/2 TABLET(12.5 MG) BY MOUTH DAILY 45 tablet 3   No current facility-administered medications for this encounter.    Vitals:   05/28/17 1154  BP: 108/63  Pulse: 88  SpO2: 96%   Wt Readings from Last 3 Encounters:  05/12/17 239 lb (108.4 kg)  11/12/16 245 lb 4 oz (111.2 kg)  07/31/16 243 lb 12.8 oz (110.6 kg)    PHYSICAL EXAM: General:  Well appearing. No resp difficulty HEENT: normal Neck: supple. no JVD. Carotids 2+ bilat; no bruits. No lymphadenopathy or thryomegaly appreciated. Cor: PMI nondisplaced. Mildly irregular egular rate & rhythm. No rubs, gallops or murmurs. Lungs: clear Abdomen: soft, nontender, nondistended. No hepatosplenomegaly. No bruits or masses. Good bowel  sounds. Extremities: no cyanosis, clubbing, rash, edema Neuro: alert & orientedx3, cranial nerves grossly intact. moves all 4 extremities w/o difficulty. Affect pleasant  ECG: NSR 84 with occasional PACs; Personally reviewed    ASSESSMENT & PLAN: 1) Increased dyspnea and fatigue - Unclear etiology. No evidence of volume overload on exam or Optivol. No clear exertional angina. We discussed the fact that it just may be age-related. Will repeat echo. If a question remains we discussed option of R/L cardiac cath - My suspicion is that it may be truly age reltaed 2) CAD: s/p CABG 2001with LIMA to LAD  - As above. No clear exertional angina.  Will continue medical management for now (BB, statin, and ASA) - If symptoms persist, consider cath 3) Chronic systolic HF: ICM, ECHO 62-70%. 06/2016. medtroinc ICD.  - Progressive NYHA class IIIB symptoms - Volume status ok. Continue lasix 40 mg daily.  - Continue 12.5 mg spiro daily.  - Continue coreg 12.5 mg twice a day + losartan 50 mg daily.  - Ideally would like to switch him to [facility] but BP has not been able to tolerate med titration due to low BP 4) HTN - Blood pressure well controlled. Continue current regimen. 5) HLD - managed by PCP. Continue statin. No change.     Glori Bickers, MD  12:07 PM

## 2017-05-28 NOTE — H&P (View-Only) (Signed)
Patient ID: Joseph Malone, male   DOB: [birth year], 84 y.o.   MRN: [id number]  CARDIOLOGY CLINIC NOTE PCP: Garret Reddish  Patient ID: Joseph Malone, male   DOB: 1931-02-01, 84 y.o.   MRN: [id number] HPI: Joseph Malone  is a pleasant 84 year old male with a history of coronary artery disease status post large anterior MI with coronary  bypass grafting in 2001 with a LIMA to the LAD.  He has history of  congestive heart failure secondary to resultant ischemic cardiomyopathy,  ejection fraction however is in the 35-40% (08/2005).  He is status post  single-chamber ICD.  He did have a nuclear study in March 01, 2006, which showed an EF of 38% a large anterior infarction, but no ischemia. ETT 11/2009 was normal (8:42min on [place])  The rest of his medical history is notable for obesity, hypertension,  hyperlipidemia, and glucose intolerance.  He presents today for acute work-in visit due to increased SOB. Saw PCP about 2 weeks ago c/o increased dyspnea. Pro-BNP 320. CXR clear. Says it has increased over past year. More fatigued. Still walking on TM but gets tired easier and more fatigued. Has chronic chest soreness from CABG but no angina. (Had clear exertional angina prior to CABG) Weight is actually down and not up. No edema, orthopnea or PND. Denies heavy snoring or daytime sleepiness   ECHO 06/2016 EF 35-40%   Echo 12/14 EF 30-35% + ICD (followed by Dr. Lovena Le) - ICD gen chang 06/01/13  Optivol: Thoracic impedence up. His fluid goes up and down. Pt activity ~1-2 hrs daily.  Review of systems complete and found to be negative unless listed in HPI.    Past Medical History:  Diagnosis Date  . BPH (benign prostatic hypertrophy)   . CAD (coronary artery disease)    s/p CABG  . Diverticulosis   . Diverticulosis of colon without hemorrhage 10/22/2007   Qualifier: Diagnosis of  By: Linna Darner MD, [name]    . Excess weight   . Fasting hyperglycemia   . Heart attack ([place]) 2001  . Heart failure    CHF due to  ischemic CM  . Hyperlipidemia   . Hypertension   . Ischemic cardiomyopathy   . Microscopic hematuria    [facility]    Current Outpatient Medications  Medication Sig Dispense Refill  . acetaminophen (TYLENOL) 500 MG tablet Take 500 mg by mouth at bedtime.     Marland Kitchen aspirin 81 MG tablet Take 1 tablet (81 mg total) by mouth every evening.    . carvedilol (COREG) 25 MG tablet Take 0.5 tablets (12.5 mg total) by mouth 2 (two) times daily. 180 tablet 3  . Cholecalciferol (VITAMIN D3) 2000 UNITS TABS Take 2,000 Units by mouth every evening.     . folic acid (FOLVITE) 270 MCG tablet Take 400 mcg by mouth daily.      . furosemide (LASIX) 40 MG tablet Take 1 tablet (40 mg total) by mouth daily. 90 tablet 3  . Glucosamine HCl 1500 MG TABS Take 1,500 mg by mouth every evening.     Marland Kitchen losartan (COZAAR) 50 MG tablet Take 1 tablet (50 mg total) by mouth daily. 90 tablet 3  . Multiple Vitamin (MULTIVITAMIN WITH MINERALS) TABS Take 1 tablet by mouth daily.     . potassium chloride SA (K-DUR,KLOR-CON) 20 MEQ tablet Take 1 tablet (20 mEq total) daily by mouth. 90 tablet 3  . simvastatin (ZOCOR) 40 MG tablet Take 0.5 tablets (20 mg total) by mouth at bedtime.  90 tablet 3  . spironolactone (ALDACTONE) 25 MG tablet TAKE 1/2 TABLET(12.5 MG) BY MOUTH DAILY 45 tablet 3   No current facility-administered medications for this encounter.    Vitals:   05/28/17 1154  BP: 108/63  Pulse: 88  SpO2: 96%   Wt Readings from Last 3 Encounters:  05/12/17 239 lb (108.4 kg)  11/12/16 245 lb 4 oz (111.2 kg)  07/31/16 243 lb 12.8 oz (110.6 kg)    PHYSICAL EXAM: General:  Well appearing. No resp difficulty HEENT: normal Neck: supple. no JVD. Carotids 2+ bilat; no bruits. No lymphadenopathy or thryomegaly appreciated. Cor: PMI nondisplaced. Mildly irregular egular rate & rhythm. No rubs, gallops or murmurs. Lungs: clear Abdomen: soft, nontender, nondistended. No hepatosplenomegaly. No bruits or masses. Good bowel  sounds. Extremities: no cyanosis, clubbing, rash, edema Neuro: alert & orientedx3, cranial nerves grossly intact. moves all 4 extremities w/o difficulty. Affect pleasant  ECG: NSR 84 with occasional PACs; Personally reviewed    ASSESSMENT & PLAN: 1) Increased dyspnea and fatigue - Unclear etiology. No evidence of volume overload on exam or Optivol. No clear exertional angina. We discussed the fact that it just may be age-related. Will repeat echo. If a question remains we discussed option of R/L cardiac cath - My suspicion is that it may be truly age reltaed 2) CAD: s/p CABG 2001with LIMA to LAD  - As above. No clear exertional angina.  Will continue medical management for now (BB, statin, and ASA) - If symptoms persist, consider cath 3) Chronic systolic HF: ICM, ECHO 16-96%. 06/2016. medtroinc ICD.  - Progressive NYHA class IIIB symptoms - Volume status ok. Continue lasix 40 mg daily.  - Continue 12.5 mg spiro daily.  - Continue coreg 12.5 mg twice a day + losartan 50 mg daily.  - Ideally would like to switch him to [facility] but BP has not been able to tolerate med titration due to low BP 4) HTN - Blood pressure well controlled. Continue current regimen. 5) HLD - managed by PCP. Continue statin. No change.     Glori Bickers, MD  12:07 PM

## 2017-05-28 NOTE — Patient Instructions (Signed)
Your physician has requested that you have an echocardiogram. Echocardiography is a painless test that uses sound waves to create images of your heart. It provides your doctor with information about the size and shape of your heart and how well your heart's chambers and valves are working. This procedure takes approximately one hour. There are no restrictions for this procedure.  Your physician recommends that you schedule a follow-up appointment in: 3 months with Dr. Haroldine Laws

## 2017-06-03 ENCOUNTER — Other Ambulatory Visit: Payer: Self-pay

## 2017-06-03 ENCOUNTER — Ambulatory Visit (HOSPITAL_COMMUNITY): Payer: Medicare Other | Attending: Cardiovascular Disease

## 2017-06-03 DIAGNOSIS — I110 Hypertensive heart disease with heart failure: Secondary | ICD-10-CM | POA: Insufficient documentation

## 2017-06-03 DIAGNOSIS — X32XXXD Exposure to sunlight, subsequent encounter: Secondary | ICD-10-CM | POA: Diagnosis not present

## 2017-06-03 DIAGNOSIS — Z951 Presence of aortocoronary bypass graft: Secondary | ICD-10-CM | POA: Diagnosis not present

## 2017-06-03 DIAGNOSIS — L570 Actinic keratosis: Secondary | ICD-10-CM | POA: Diagnosis not present

## 2017-06-03 DIAGNOSIS — I2510 Atherosclerotic heart disease of native coronary artery without angina pectoris: Secondary | ICD-10-CM | POA: Diagnosis not present

## 2017-06-03 DIAGNOSIS — I255 Ischemic cardiomyopathy: Secondary | ICD-10-CM | POA: Insufficient documentation

## 2017-06-03 DIAGNOSIS — I5022 Chronic systolic (congestive) heart failure: Secondary | ICD-10-CM | POA: Diagnosis not present

## 2017-06-03 DIAGNOSIS — E785 Hyperlipidemia, unspecified: Secondary | ICD-10-CM | POA: Diagnosis not present

## 2017-06-03 MED ORDER — PERFLUTREN LIPID MICROSPHERE
1.0000 mL | INTRAVENOUS | Status: AC | PRN
  Administered 2017-06-03: 2 mL via INTRAVENOUS

## 2017-06-03 NOTE — Progress Notes (Unsigned)
There appears to be an apical thrombus. I notified the D.O.D, Dr. Caryl Comes. Dr. Caryl Comes notified Dr. Haroldine Laws of the findings. I was asked to tell the patient per Dr. Caryl Comes Dr. Haroldine Laws, that there is concern for a clot and that Dr. Haroldine Laws will review the echo and compare it to the previous echo performed on 06/13/2015 to determine if the patient needs to be placed on a blood thinner. Patient could leave once the message was relayed to him.   Diamond Nickel, RCS

## 2017-06-05 ENCOUNTER — Other Ambulatory Visit (HOSPITAL_COMMUNITY): Payer: Self-pay | Admitting: *Deleted

## 2017-06-05 ENCOUNTER — Telehealth (HOSPITAL_COMMUNITY): Payer: Self-pay | Admitting: *Deleted

## 2017-06-05 DIAGNOSIS — R0600 Dyspnea, unspecified: Secondary | ICD-10-CM

## 2017-06-05 DIAGNOSIS — I5022 Chronic systolic (congestive) heart failure: Secondary | ICD-10-CM

## 2017-06-05 NOTE — Telephone Encounter (Signed)
Called pt to sch R/L HC per Dr Haroldine Laws, pt sch for 06/12/17 at 10 am, pt is aware to arrive at 8 am, all instructions reviewed w/pt.  He will come to our office 4/30 for labs.

## 2017-06-09 ENCOUNTER — Ambulatory Visit (HOSPITAL_COMMUNITY)
Admission: RE | Admit: 2017-06-09 | Discharge: 2017-06-09 | Disposition: A | Discharge status: Home / Self Care | Payer: Medicare Other | Source: Ambulatory Visit | Attending: Cardiology | Admitting: Cardiology

## 2017-06-09 DIAGNOSIS — R0600 Dyspnea, unspecified: Secondary | ICD-10-CM | POA: Insufficient documentation

## 2017-06-09 DIAGNOSIS — I5022 Chronic systolic (congestive) heart failure: Secondary | ICD-10-CM | POA: Insufficient documentation

## 2017-06-09 LAB — BASIC METABOLIC PANEL
ANION GAP: 10 (ref 5–15)
BUN: 22 mg/dL — AB (ref 6–20)
CHLORIDE: 108 mmol/L (ref 101–111)
CO2: 21 mmol/L — ABNORMAL LOW (ref 22–32)
Calcium: 9.3 mg/dL (ref 8.9–10.3)
Creatinine, Ser: 1.2 mg/dL (ref 0.61–1.24)
GFR, EST NON AFRICAN AMERICAN: 53 mL/min — AB (ref >60)
Glucose, Bld: 185 mg/dL — ABNORMAL HIGH (ref 65–99)
POTASSIUM: 4.4 mmol/L (ref 3.5–5.1)
SODIUM: 139 mmol/L (ref 135–145)

## 2017-06-09 LAB — CBC
HCT: 44 % (ref 39.0–52.0)
Hemoglobin: 14.7 g/dL (ref 13.0–17.0)
MCH: 31.2 pg (ref 26.0–34.0)
MCHC: 33.4 g/dL (ref 30.0–36.0)
MCV: 93.4 fL (ref 78.0–100.0)
PLATELETS: 168 10*3/uL (ref 150–400)
RBC: 4.71 MIL/uL (ref 4.22–5.81)
RDW: 12.5 % (ref 11.5–15.5)
WBC: 7.9 10*3/uL (ref 4.0–10.5)

## 2017-06-09 LAB — PROTIME-INR
INR: 0.98
PROTHROMBIN TIME: 12.9 s (ref 11.4–15.2)

## 2017-06-11 ENCOUNTER — Encounter (HOSPITAL_COMMUNITY): Payer: Medicare Other | Admitting: Internal Medicine

## 2017-06-12 ENCOUNTER — Ambulatory Visit (HOSPITAL_COMMUNITY)
Admission: RE | Admit: 2017-06-12 | Discharge: 2017-06-12 | Disposition: A | Discharge status: Home / Self Care | Payer: Medicare Other | Source: Ambulatory Visit | Attending: Internal Medicine | Admitting: Internal Medicine

## 2017-06-12 ENCOUNTER — Ambulatory Visit (HOSPITAL_COMMUNITY)
Admission: RE | Disposition: A | Discharge status: Home / Self Care | Payer: Self-pay | Source: Ambulatory Visit | Attending: Internal Medicine

## 2017-06-12 ENCOUNTER — Encounter (HOSPITAL_COMMUNITY): Payer: Self-pay | Admitting: *Deleted

## 2017-06-12 DIAGNOSIS — E785 Hyperlipidemia, unspecified: Secondary | ICD-10-CM | POA: Diagnosis not present

## 2017-06-12 DIAGNOSIS — Z6833 Body mass index (BMI) 33.0-33.9, adult: Secondary | ICD-10-CM | POA: Diagnosis not present

## 2017-06-12 DIAGNOSIS — I6502 Occlusion and stenosis of left vertebral artery: Secondary | ICD-10-CM | POA: Diagnosis not present

## 2017-06-12 DIAGNOSIS — I2510 Atherosclerotic heart disease of native coronary artery without angina pectoris: Secondary | ICD-10-CM | POA: Insufficient documentation

## 2017-06-12 DIAGNOSIS — R0600 Dyspnea, unspecified: Secondary | ICD-10-CM

## 2017-06-12 DIAGNOSIS — I2582 Chronic total occlusion of coronary artery: Secondary | ICD-10-CM | POA: Insufficient documentation

## 2017-06-12 DIAGNOSIS — Z7982 Long term (current) use of aspirin: Secondary | ICD-10-CM | POA: Insufficient documentation

## 2017-06-12 DIAGNOSIS — I110 Hypertensive heart disease with heart failure: Secondary | ICD-10-CM | POA: Diagnosis not present

## 2017-06-12 DIAGNOSIS — E669 Obesity, unspecified: Secondary | ICD-10-CM | POA: Insufficient documentation

## 2017-06-12 DIAGNOSIS — Z95810 Presence of automatic (implantable) cardiac defibrillator: Secondary | ICD-10-CM | POA: Insufficient documentation

## 2017-06-12 DIAGNOSIS — Z951 Presence of aortocoronary bypass graft: Secondary | ICD-10-CM | POA: Diagnosis not present

## 2017-06-12 DIAGNOSIS — I255 Ischemic cardiomyopathy: Secondary | ICD-10-CM | POA: Insufficient documentation

## 2017-06-12 DIAGNOSIS — I5022 Chronic systolic (congestive) heart failure: Secondary | ICD-10-CM | POA: Insufficient documentation

## 2017-06-12 DIAGNOSIS — N400 Enlarged prostate without lower urinary tract symptoms: Secondary | ICD-10-CM | POA: Insufficient documentation

## 2017-06-12 DIAGNOSIS — I252 Old myocardial infarction: Secondary | ICD-10-CM | POA: Diagnosis not present

## 2017-06-12 HISTORY — PX: RIGHT/LEFT HEART CATH AND CORONARY/GRAFT ANGIOGRAPHY: CATH118267

## 2017-06-12 LAB — URINALYSIS, COMPLETE (UACMP) WITH MICROSCOPIC
Bilirubin Urine: NEGATIVE
GLUCOSE, UA: NEGATIVE mg/dL
Hgb urine dipstick: NEGATIVE
KETONES UR: NEGATIVE mg/dL
Leukocytes, UA: NEGATIVE
Nitrite: NEGATIVE
Protein, ur: NEGATIVE mg/dL
Specific Gravity, Urine: 1.018 (ref 1.005–1.030)
pH: 5 (ref 5.0–8.0)

## 2017-06-12 LAB — POCT I-STAT 3, ART BLOOD GAS (G3+)
Acid-base deficit: 4 mmol/L — ABNORMAL HIGH (ref 0.0–2.0)
BICARBONATE: 21.7 mmol/L (ref 20.0–28.0)
O2 Saturation: 97 %
PCO2 ART: 41.3 mmHg (ref 32.0–48.0)
PH ART: 7.328 — AB (ref 7.350–7.450)
PO2 ART: 100 mmHg (ref 83.0–108.0)
TCO2: 23 mmol/L (ref 22–32)

## 2017-06-12 LAB — POCT I-STAT 3, VENOUS BLOOD GAS (G3P V)
Acid-base deficit: 3 mmol/L — ABNORMAL HIGH (ref 0.0–2.0)
Acid-base deficit: 5 mmol/L — ABNORMAL HIGH (ref 0.0–2.0)
BICARBONATE: 21.5 mmol/L (ref 20.0–28.0)
Bicarbonate: 23 mmol/L (ref 20.0–28.0)
O2 SAT: 62 %
O2 SAT: 63 %
PCO2 VEN: 44.4 mmHg (ref 44.0–60.0)
PO2 VEN: 36 mmHg (ref 32.0–45.0)
PO2 VEN: 36 mmHg (ref 32.0–45.0)
TCO2: 23 mmol/L (ref 22–32)
TCO2: 24 mmol/L (ref 22–32)
pCO2, Ven: 45.8 mmHg (ref 44.0–60.0)
pH, Ven: 7.294 (ref 7.250–7.430)
pH, Ven: 7.308 (ref 7.250–7.430)

## 2017-06-12 LAB — GLUCOSE, CAPILLARY: GLUCOSE-CAPILLARY: 165 mg/dL — AB (ref 65–99)

## 2017-06-12 SURGERY — RIGHT/LEFT HEART CATH AND CORONARY/GRAFT ANGIOGRAPHY
Anesthesia: LOCAL

## 2017-06-12 MED ORDER — ASPIRIN 81 MG PO CHEW
81.0000 mg | CHEWABLE_TABLET | ORAL | Status: AC
  Administered 2017-06-12: 81 mg via ORAL

## 2017-06-12 MED ORDER — SODIUM CHLORIDE 0.9% FLUSH: 3.0000 mL | Freq: Two times a day (BID) | INTRAVENOUS | Status: DC

## 2017-06-12 MED ORDER — VERAPAMIL HCL 2.5 MG/ML IV SOLN: INTRAVENOUS | Status: DC | PRN

## 2017-06-12 MED ORDER — ACETAMINOPHEN 325 MG PO TABS: 650.0000 mg | ORAL_TABLET | ORAL | Status: DC | PRN

## 2017-06-12 MED ORDER — SODIUM CHLORIDE 0.9 % IV SOLN: INTRAVENOUS | Status: DC

## 2017-06-12 MED ORDER — ASPIRIN 81 MG PO CHEW: 81.0000 mg | CHEWABLE_TABLET | ORAL | Status: DC

## 2017-06-12 MED ORDER — FENTANYL CITRATE (PF) 100 MCG/2ML IJ SOLN
INTRAMUSCULAR | Status: AC
  Filled 2017-06-12: qty 2

## 2017-06-12 MED ORDER — FENTANYL CITRATE (PF) 100 MCG/2ML IJ SOLN
INTRAMUSCULAR | Status: DC | PRN
  Administered 2017-06-12: 25 ug via INTRAVENOUS

## 2017-06-12 MED ORDER — LIDOCAINE HCL (PF) 1 % IJ SOLN
INTRAMUSCULAR | Status: AC
  Filled 2017-06-12: qty 30

## 2017-06-12 MED ORDER — SODIUM CHLORIDE 0.9 % IV SOLN: 250.0000 mL | INTRAVENOUS | Status: DC | PRN

## 2017-06-12 MED ORDER — ASPIRIN 81 MG PO CHEW
CHEWABLE_TABLET | ORAL | Status: AC
  Filled 2017-06-12: qty 1

## 2017-06-12 MED ORDER — SODIUM CHLORIDE 0.9% FLUSH: 3.0000 mL | INTRAVENOUS | Status: DC | PRN

## 2017-06-12 MED ORDER — ONDANSETRON HCL 4 MG/2ML IJ SOLN
4.0000 mg | Freq: Four times a day (QID) | INTRAMUSCULAR | Status: DC | PRN
Start: 2017-06-12 — End: 2017-06-12

## 2017-06-12 MED ORDER — IOPAMIDOL (ISOVUE-370) INJECTION 76%
INTRAVENOUS | Status: AC
  Filled 2017-06-12: qty 100

## 2017-06-12 MED ORDER — HEPARIN (PORCINE) IN NACL 1000-0.9 UT/500ML-% IV SOLN
INTRAVENOUS | Status: AC
  Filled 2017-06-12: qty 1000

## 2017-06-12 MED ORDER — SODIUM CHLORIDE 0.9 % IV SOLN: INTRAVENOUS | Status: AC

## 2017-06-12 MED ORDER — MIDAZOLAM HCL 2 MG/2ML IJ SOLN
INTRAMUSCULAR | Status: DC | PRN
  Administered 2017-06-12: 1 mg via INTRAVENOUS

## 2017-06-12 MED ORDER — MIDAZOLAM HCL 2 MG/2ML IJ SOLN
INTRAMUSCULAR | Status: AC
  Filled 2017-06-12: qty 2

## 2017-06-12 MED ORDER — IOPAMIDOL (ISOVUE-370) INJECTION 76%
INTRAVENOUS | Status: DC | PRN
  Administered 2017-06-12: 35 mL via INTRA_ARTERIAL

## 2017-06-12 MED ORDER — VERAPAMIL HCL 2.5 MG/ML IV SOLN
INTRAVENOUS | Status: AC
  Filled 2017-06-12: qty 2

## 2017-06-12 MED ORDER — HEPARIN (PORCINE) IN NACL 2-0.9 UNITS/ML
INTRAMUSCULAR | Status: DC | PRN
  Administered 2017-06-12 (×2): 500 mL via INTRA_ARTERIAL

## 2017-06-12 MED ORDER — LIDOCAINE HCL (PF) 1 % IJ SOLN
INTRAMUSCULAR | Status: DC | PRN
  Administered 2017-06-12: 20 mL
  Administered 2017-06-12: 5 mL

## 2017-06-12 SURGICAL SUPPLY — 16 items
CATH BALLN WEDGE 5F 110CM (CATHETERS) ×2 IMPLANT
CATH INFINITI 5FR ANG PIGTAIL (CATHETERS) ×1 IMPLANT
CATH INFINITI MULTIPACK ST 5F (CATHETERS) ×2 IMPLANT
CATH SWAN GANZ 7F STRAIGHT (CATHETERS) ×1 IMPLANT
GLIDESHEATH SLEND SS 6F .021 (SHEATH) ×2 IMPLANT
GUIDEWIRE INQWIRE 1.5J.035X260 (WIRE) IMPLANT
INQWIRE 1.5J .035X260CM (WIRE) ×2
KIT HEART LEFT (KITS) ×2 IMPLANT
PACK CARDIAC CATHETERIZATION (CUSTOM PROCEDURE TRAY) ×2 IMPLANT
SHEATH GLIDE SLENDER 4/5FR (SHEATH) ×1 IMPLANT
SHEATH PINNACLE 5F 10CM (SHEATH) ×2 IMPLANT
SHEATH PINNACLE 7F 10CM (SHEATH) ×1 IMPLANT
TRANSDUCER W/STOPCOCK (MISCELLANEOUS) ×2 IMPLANT
TUBING CIL FLEX 10 FLL-RA (TUBING) ×2 IMPLANT
WIRE EMERALD 3MM-J .035X150CM (WIRE) ×1 IMPLANT
WIRE MICROINTRODUCER 60CM (WIRE) ×2 IMPLANT

## 2017-06-12 NOTE — Interval H&P Note (Signed)
History and Physical Interval Note:  06/12/2017 10:07 AM  Joseph Malone  has presented today for surgery, with the diagnosis of hf   sob  The various methods of treatment have been discussed with the patient and family. After consideration of risks, benefits and other options for treatment, the patient has consented to  Procedure(s): RIGHT/LEFT HEART CATH AND CORONARY/GRAFT ANGIOGRAPHY (N/A) and possible coronary angioplasty as a surgical intervention .  The patient's history has been reviewed, patient examined, no change in status, stable for surgery.  I have reviewed the patient's chart and labs.  Questions were answered to the patient's satisfaction.     Joseph Malone

## 2017-06-12 NOTE — Progress Notes (Signed)
Site area: left ac venous sheath Site Prior to Removal:  Level 0 Pressure Applied For: 10 minutes Manual:   yes Patient Status During Pull:  stable Post Pull Site:  Level 0 Post Pull Instructions Given:  yes Post Pull Pulses Present: palpable left radial  Dressing Applied:  Gauze and tegaderm Bedrest begins @  Comments:

## 2017-06-12 NOTE — Progress Notes (Signed)
Site area: rt groin fa and fv sheaths pulled and pressure held by Vassie Moselle Site Prior to Removal:  Level 0 Pressure Applied For: 25 minutes Manual:   yes Patient Status During Pull:  stable Post Pull Site:  Level 0 Post Pull Instructions Given:  yes Post Pull Pulses Present: palpable rt dp Dressing Applied:  Gauze and tegaderm Bedrest begins @ 1210 Comments:

## 2017-06-12 NOTE — Discharge Instructions (Signed)
Drink plenty of fluids over next 48 hours   Femoral Site Care Refer to this sheet in the next few weeks. These instructions provide you with information about caring for yourself after your procedure. Your health care provider may also give you more specific instructions. Your treatment has been planned according to current medical practices, but problems sometimes occur. Call your health care provider if you have any problems or questions after your procedure. What can I expect after the procedure? After your procedure, it is typical to have the following:  Bruising at the site that usually fades within 1-2 weeks.  Blood collecting in the tissue (hematoma) that may be painful to the touch. It should usually decrease in size and tenderness within 1-2 weeks.  Follow these instructions at home:  Take medicines only as directed by your health care provider.  You may shower 24-48 hours after the procedure or as directed by your health care provider. Remove the bandage (dressing) and gently wash the site with plain soap and water. Pat the area dry with a clean towel. Do not rub the site, because this may cause bleeding.  Do not take baths, swim, or use a hot tub until your health care provider approves.  Check your insertion site every day for redness, swelling, or drainage.  Do not apply powder or lotion to the site.  Limit use of stairs to twice a day for the first 2-3 days or as directed by your health care provider.  Do not squat for the first 2-3 days or as directed by your health care provider.  Do not lift over 10 lb (4.5 kg) for 5 days after your procedure or as directed by your health care provider.  Ask your health care provider when it is okay to: ? Return to work or school. ? Resume usual physical activities or sports. ? Resume sexual activity.  Do not drive home if you are discharged the same day as the procedure. Have someone else drive you.  You may drive 24 hours after  the procedure unless otherwise instructed by your health care provider.  Do not operate machinery or power tools for 24 hours after the procedure or as directed by your health care provider.  If your procedure was done as an outpatient procedure, which means that you went home the same day as your procedure, a responsible adult should be with you for the first 24 hours after you arrive home.  Keep all follow-up visits as directed by your health care provider. This is important. Contact a health care provider if:  You have a fever.  You have chills.  You have increased bleeding from the site. Hold pressure on the site. Get help right away if:  You have unusual pain at the site.  You have redness, warmth, or swelling at the site.  You have drainage (other than a small amount of blood on the dressing) from the site.  The site is bleeding, and the bleeding does not stop after 30 minutes of holding steady pressure on the site.  Your leg or foot becomes pale, cool, tingly, or numb. This information is not intended to replace advice given to you by your health care provider. Make sure you discuss any questions you have with your health care provider. Document Released: 09/30/2013 Document Revised: 07/05/2015 Document Reviewed: 08/16/2013 Elsevier Interactive Patient Education  2018 [place] Refer to this sheet in the next few weeks. These instructions provide you with  information about caring for yourself after your procedure. Your health care provider may also give you more specific instructions. Your treatment has been planned according to current medical practices, but problems sometimes occur. Call your health care provider if you have any problems or questions after your procedure. What can I expect after the procedure? After your procedure, it is typical to have the following:  Bruising at the radial site that usually fades within 1-2 weeks.  Blood  collecting in the tissue (hematoma) that may be painful to the touch. It should usually decrease in size and tenderness within 1-2 weeks.  Follow these instructions at home:  Take medicines only as directed by your health care provider.  You may shower 24-48 hours after the procedure or as directed by your health care provider. Remove the bandage (dressing) and gently wash the site with plain soap and water. Pat the area dry with a clean towel. Do not rub the site, because this may cause bleeding.  Do not take baths, swim, or use a hot tub until your health care provider approves.  Check your insertion site every day for redness, swelling, or drainage.  Do not apply powder or lotion to the site.  Do not flex or bend the affected arm for 24 hours or as directed by your health care provider.  Do not push or pull heavy objects with the affected arm for 24 hours or as directed by your health care provider.  Do not lift over 10 lb (4.5 kg) for 5 days after your procedure or as directed by your health care provider.  Ask your health care provider when it is okay to: ? Return to work or school. ? Resume usual physical activities or sports. ? Resume sexual activity.  Do not drive home if you are discharged the same day as the procedure. Have someone else drive you.  You may drive 24 hours after the procedure unless otherwise instructed by your health care provider.  Do not operate machinery or power tools for 24 hours after the procedure.  If your procedure was done as an outpatient procedure, which means that you went home the same day as your procedure, a responsible adult should be with you for the first 24 hours after you arrive home.  Keep all follow-up visits as directed by your health care provider. This is important. Contact a health care provider if:  You have a fever.  You have chills.  You have increased bleeding from the radial site. Hold pressure on the site. Get help  right away if:  You have unusual pain at the radial site.  You have redness, warmth, or swelling at the radial site.  You have drainage (other than a small amount of blood on the dressing) from the radial site.  The radial site is bleeding, and the bleeding does not stop after 30 minutes of holding steady pressure on the site.  Your arm or hand becomes pale, cool, tingly, or numb. This information is not intended to replace advice given to you by your health care provider. Make sure you discuss any questions you have with your health care provider. Document Released: 03/01/2010 Document Revised: 07/05/2015 Document Reviewed: 08/15/2013 Elsevier Interactive Patient Education  2018 [employer].

## 2017-06-15 ENCOUNTER — Encounter (HOSPITAL_COMMUNITY): Payer: Self-pay | Admitting: Internal Medicine

## 2017-06-16 MED FILL — Heparin Sod (Porcine)-NaCl IV Soln 1000 Unit/500ML-0.9%: INTRAVENOUS | Qty: 1000 | Status: AC

## 2017-06-16 MED FILL — Verapamil HCl IV Soln 2.5 MG/ML: INTRAVENOUS | Qty: 2 | Status: AC

## 2017-06-26 ENCOUNTER — Telehealth (HOSPITAL_COMMUNITY): Payer: Self-pay | Admitting: *Deleted

## 2017-06-26 ENCOUNTER — Other Ambulatory Visit (HOSPITAL_COMMUNITY): Payer: Self-pay | Admitting: Internal Medicine

## 2017-06-26 MED ORDER — WARFARIN SODIUM 5 MG PO TABS: 5.0000 mg | ORAL_TABLET | Freq: Every day | ORAL | 0 refills | Status: DC

## 2017-06-26 NOTE — Telephone Encounter (Signed)
Pt was supposed to start Coumadin after his heart cath due to apical thrombus but do not see that was started, discussed w/pt he states it was not started.  Per Dr Haroldine Laws start asap, discussed w/Lisa Pablo Lawrence D she states pt should start on 5 mg daily and have INR done early next week.  Pt is aware and agreeable, he request to have this followed w/Dr [facility] office as it is closer to him.  Called Dr Ansel Bong office and sch an appt for Tue 5/21 at 9:15, pt is aware and agreeable.

## 2017-06-30 ENCOUNTER — Ambulatory Visit: Payer: Medicare Other

## 2017-07-01 ENCOUNTER — Ambulatory Visit (INDEPENDENT_AMBULATORY_CARE_PROVIDER_SITE_OTHER): Payer: Medicare Other | Admitting: *Deleted

## 2017-07-01 ENCOUNTER — Ambulatory Visit (INDEPENDENT_AMBULATORY_CARE_PROVIDER_SITE_OTHER): Payer: Medicare Other | Admitting: General Practice

## 2017-07-01 DIAGNOSIS — I255 Ischemic cardiomyopathy: Secondary | ICD-10-CM | POA: Diagnosis not present

## 2017-07-01 DIAGNOSIS — Z7901 Long term (current) use of anticoagulants: Secondary | ICD-10-CM | POA: Diagnosis not present

## 2017-07-01 DIAGNOSIS — I5022 Chronic systolic (congestive) heart failure: Secondary | ICD-10-CM

## 2017-07-01 LAB — POCT INR: INR: 2 (ref 2.0–3.0)

## 2017-07-01 NOTE — Patient Instructions (Addendum)
Pre visit review using our clinic review tool, if applicable. No additional management support is needed unless otherwise documented below in the visit note.  Take 1 tablet daily except take 1/2 tablet on Friday.  Re-check in 1 week.  A full discussion of the nature of anticoagulants has been carried out.  A benefit risk analysis has been presented to the patient, so that they understand the justification for choosing anticoagulation at this time. The need for frequent and regular monitoring, precise dosage adjustment and compliance is stressed.  Side effects of potential bleeding are discussed.  The patient should avoid any OTC items containing aspirin or ibuprofen, and should avoid great swings in general diet.  Avoid alcohol consumption.  Call if any signs of abnormal bleeding.

## 2017-07-01 NOTE — Progress Notes (Signed)
Remote ICD transmission.   

## 2017-07-08 ENCOUNTER — Ambulatory Visit (INDEPENDENT_AMBULATORY_CARE_PROVIDER_SITE_OTHER): Payer: Medicare Other | Admitting: General Practice

## 2017-07-08 DIAGNOSIS — Z7901 Long term (current) use of anticoagulants: Secondary | ICD-10-CM | POA: Insufficient documentation

## 2017-07-08 LAB — POCT INR: INR: 3.9 — AB (ref 2.0–3.0)

## 2017-07-08 NOTE — Patient Instructions (Addendum)
Pre visit review using our clinic review tool, if applicable. No additional management support is needed unless otherwise documented below in the visit note.  Skip coumadin today (5/29) and then change dosage and take 1 tablet daily except take 1/2 tablet on Mondays and Friday.  Re-check in 1 week.

## 2017-07-08 NOTE — Progress Notes (Signed)
I have reviewed and agree with note, evaluation, plan.   Dracen Reigle, MD  

## 2017-07-15 ENCOUNTER — Ambulatory Visit (INDEPENDENT_AMBULATORY_CARE_PROVIDER_SITE_OTHER): Payer: Medicare Other | Admitting: General Practice

## 2017-07-15 DIAGNOSIS — Z7901 Long term (current) use of anticoagulants: Secondary | ICD-10-CM | POA: Diagnosis not present

## 2017-07-15 LAB — POCT INR: INR: 4.3 — AB (ref 2.0–3.0)

## 2017-07-15 NOTE — Patient Instructions (Addendum)
Pre visit review using our clinic review tool, if applicable. No additional management support is needed unless otherwise documented below in the visit note.  Skip coumadin today and tomorrow (6/5 and 6/6) and then take 1/2 tablet daily except 1 tablet on Sunday, Tuesday and Thursday.  Re-check in 1 week.

## 2017-07-16 ENCOUNTER — Encounter: Payer: Self-pay | Admitting: Internal Medicine

## 2017-07-16 ENCOUNTER — Ambulatory Visit: Payer: Medicare Other | Admitting: Internal Medicine

## 2017-07-16 VITALS — BP 128/62 | HR 82 | Ht 71.0 in | Wt 246.0 lb

## 2017-07-16 DIAGNOSIS — I5022 Chronic systolic (congestive) heart failure: Secondary | ICD-10-CM | POA: Diagnosis not present

## 2017-07-16 DIAGNOSIS — I255 Ischemic cardiomyopathy: Secondary | ICD-10-CM

## 2017-07-16 DIAGNOSIS — Z95810 Presence of automatic (implantable) cardiac defibrillator: Secondary | ICD-10-CM | POA: Diagnosis not present

## 2017-07-16 LAB — CUP PACEART INCLINIC DEVICE CHECK
Battery Remaining Longevity: 102 mo
Battery Voltage: 3.01 V
Brady Statistic RV Percent Paced: 0.01 %
HighPow Impedance: 66 Ohm
Implantable Lead Model: 6935
Lead Channel Impedance Value: 418 Ohm
Lead Channel Impedance Value: 475 Ohm
Lead Channel Sensing Intrinsic Amplitude: 17.75 mV
Lead Channel Setting Pacing Pulse Width: 0.4 ms
Lead Channel Setting Sensing Sensitivity: 0.3 mV
MDC IDC LEAD IMPLANT DT: 20150422
MDC IDC LEAD LOCATION: 753860
MDC IDC MSMT LEADCHNL RV PACING THRESHOLD AMPLITUDE: 0.5 V
MDC IDC MSMT LEADCHNL RV PACING THRESHOLD PULSEWIDTH: 0.4 ms
MDC IDC MSMT LEADCHNL RV SENSING INTR AMPL: 14.375 mV
MDC IDC PG IMPLANT DT: 20150422
MDC IDC SESS DTM: 20190606112222
MDC IDC SET LEADCHNL RV PACING AMPLITUDE: 2 V

## 2017-07-16 NOTE — Progress Notes (Signed)
HPI [name] returns today for followup. He is a very pleasant 84 year old man with an ischemic cardiomyopathy, chronic systolic heart failure, status post ICD implantation. He notes that he has developed sob and fatigue.  He has He denies sodium indiscretion. His main complaint today is that he gets sob but notes that he has not been taking his lasix. He has not had any ICD shocks. He underwent heart cath which demonstrated no CAD, and LV dysfunction.   Allergies  Allergen Reactions  . Penicillins Rash and Other (See Comments)    Because of a history of documented adverse serious drug reaction;[facility] bracelet  is recommended Has patient had a PCN reaction causing immediate rash, facial/tongue/throat swelling, SOB or lightheadedness with hypotension: No Has patient had a PCN reaction causing severe rash involving mucus membranes or skin necrosis: No Has patient had a PCN reaction that required hospitalization: No Has patient had a PCN reaction occurring within the last 10 years: No If all of the above answers are "NO", t     Current Outpatient Medications  Medication Sig Dispense Refill  . acetaminophen (TYLENOL) 500 MG tablet Take 500 mg by mouth at bedtime.     Marland Kitchen aspirin 81 MG tablet Take 1 tablet (81 mg total) by mouth every evening.    . carvedilol (COREG) 25 MG tablet Take 0.5 tablets (12.5 mg total) by mouth 2 (two) times daily. 180 tablet 3  . Cholecalciferol (VITAMIN D3) 2000 UNITS TABS Take 2,000 Units by mouth daily.     . folic acid (FOLVITE) 829 MCG tablet Take 400 mcg by mouth daily.      . furosemide (LASIX) 40 MG tablet Take 1 tablet (40 mg total) by mouth daily. 90 tablet 3  . Glucosamine HCl 1500 MG TABS Take 1,500 mg by mouth every evening.     Marland Kitchen losartan (COZAAR) 50 MG tablet Take 1 tablet (50 mg total) by mouth daily. 90 tablet 3  . Multiple Vitamin (MULTIVITAMIN WITH MINERALS) TABS Take 1 tablet by mouth daily.     . potassium chloride SA  (K-DUR,KLOR-CON) 20 MEQ tablet Take 1 tablet (20 mEq total) daily by mouth. 90 tablet 3  . simvastatin (ZOCOR) 40 MG tablet Take 0.5 tablets (20 mg total) by mouth at bedtime. 90 tablet 3  . spironolactone (ALDACTONE) 25 MG tablet TAKE 1/2 TABLET(12.5 MG) BY MOUTH DAILY 45 tablet 3  . warfarin (COUMADIN) 5 MG tablet TAKE 1 TABLET(5 MG) BY MOUTH DAILY AT 6 PM 90 tablet 0   No current facility-administered medications for this visit.      Past Medical History:  Diagnosis Date  . BPH (benign prostatic hypertrophy)   . CAD (coronary artery disease)    s/p CABG  . Diverticulosis   . Diverticulosis of colon without hemorrhage 10/22/2007   Qualifier: Diagnosis of  By: Joseph Darner MD, [facility]    . Excess weight   . Fasting hyperglycemia   . Heart attack ([place]) 2001  . Heart failure    CHF due to ischemic CM  . Hyperlipidemia   . Hypertension   . Ischemic cardiomyopathy   . Microscopic hematuria    [facility]    ROS:   All systems reviewed and negative except as noted in the HPI.   Past Surgical History:  Procedure Laterality Date  . APPENDECTOMY    . CARDIAC CATHETERIZATION  2000  . CARDIAC DEFIBRILLATOR PLACEMENT  2005   Medtronic; s/p ICD gen change and RV  lead revision April 2015  . CATARACT EXTRACTION  2008   bilateral with lens implant  . COLONOSCOPY  2004   Tics; [facility]  . CORONARY ARTERY BYPASS GRAFT  2000   1 vessel  . CYSTOSCOPY  06/2012   Dr [facility]  . IMPLANTABLE CARDIOVERTER DEFIBRILLATOR (ICD) GENERATOR CHANGE N/A 06/01/2013   Procedure: ICD GENERATOR CHANGE;  Surgeon: Joseph Lance, MD;  [facility];  Service: Cardiovascular;  Laterality: N/A;  . LEAD REVISION N/A 06/01/2013   Procedure: LEAD REVISION;  Surgeon: Joseph Lance, MD;  [facility];  Service: Cardiovascular;  Laterality: N/A;  . PILONIDAL CYST EXCISION    . RIGHT/LEFT HEART CATH AND CORONARY/GRAFT ANGIOGRAPHY N/A 06/12/2017   Procedure: RIGHT/LEFT HEART CATH AND  CORONARY/GRAFT ANGIOGRAPHY;  Surgeon: Joseph Artist, MD;  Location: [facility];  Service: Cardiovascular;  Laterality: N/A;  . TRANSTHORACIC ECHOCARDIOGRAM  08/29/2005     Family History  Problem Relation Age of Onset  . Hypertension Father   . Heart attack Father 83  . Diabetes Brother        X 2  . Heart attack Brother 4  . Heart attack Paternal Uncle 60  . Sudden death Paternal Grandfather 74       ? heat stroke  . Lung cancer Sister        "Asian lung cancer"  . Stroke Neg Hx      Social History   Socioeconomic History  . Marital status: Married    Spouse name: Not on file  . Number of children: Not on file  . Years of education: Not on file  . Highest education level: Not on file  Occupational History  . Not on file  Social Needs  . Financial resource strain: Not on file  . Food insecurity:    Worry: Not on file    Inability: Not on file  . Transportation needs:    Medical: Not on file    Non-medical: Not on file  Tobacco Use  . Smoking status: Passive Smoke Exposure - Never Smoker  . Smokeless tobacco: Never Used  Substance and Sexual Activity  . Alcohol use: No  . Drug use: No  . Sexual activity: Never  Lifestyle  . Physical activity:    Days per week: Not on file    Minutes per session: Not on file  . Stress: Not on file  Relationships  . Social connections:    Talks on phone: Not on file    Gets together: Not on file    Attends religious service: Not on file    Active member of club or organization: Not on file    Attends meetings of clubs or organizations: Not on file    Relationship status: Not on file  . Intimate partner violence:    Fear of current or ex partner: Not on file    Emotionally abused: Not on file    Physically abused: Not on file    Forced sexual activity: Not on file  Other Topics Concern  . Not on file  Social History Narrative   Married 1957. 4 children 2 boys 2 girls. 15 grandkids.  4 greatgrandkids.         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 Retired from [employer] and worked 30 years.       Hobbies: genealogy, travel      No HCPOA-advised to do this.      BP 128/62   Pulse  82   Ht 5\' 11"  (1.803 m)   Wt 246 lb (111.6 kg)   BMI 34.31 kg/m   Physical Exam:  Well appearing NAD HEENT: Unremarkable Neck:  No JVD, no thyromegally Lymphatics:  No adenopathy Back:  No CVA tenderness Lungs:  Clear HEART:  Regular rate rhythm, no murmurs, no rubs, no clicks Abd:  soft, positive bowel sounds, no organomegally, no rebound, no guarding Ext:  2 plus pulses, no edema, no cyanosis, no clubbing Skin:  No rashes no nodules Neuro:  CN II through XII intact, motor grossly intact  EKG - nsr  DEVICE  Normal device function.  See PaceArt for details.   Assess/Plan: 1. Chronic systolic heart failure- his symptoms are class 2. We will follow. 2. ICD - his Medtronic single chamer ICD is working normally. Will follow. 3. PVC's - his ECG demonstrates an improvement. He will continue his curren tmeds.  Mikle Bosworth.D.

## 2017-07-16 NOTE — Progress Notes (Signed)
I have reviewed and agree with note, evaluation, plan.   Stephen Hunter, MD  

## 2017-07-16 NOTE — Patient Instructions (Signed)
Medication Instructions:  Your physician recommends that you continue on your current medications as directed. Please refer to the Current Medication list given to you today.  Labwork: None ordered.  Testing/Procedures: None ordered.  Follow-Up: Your physician wants you to follow-up in: one year with Dr. Lovena Le.   You will receive a reminder letter in the mail two months in advance. If you don't receive a letter, please call our office to schedule the follow-up appointment.  Remote monitoring is used to monitor your ICD from home. This monitoring reduces the number of office visits required to check your device to one time per year. It allows [place] to keep an eye on the functioning of your device to ensure it is working properly. You are scheduled for a device check from home on 09/30/2017. You may send your transmission at any time that day. If you have a wireless device, the transmission will be sent automatically. After your physician reviews your transmission, you will receive a postcard with your next transmission date.  Any Other Special Instructions Will Be Listed Below (If Applicable).  If you need a refill on your cardiac medications before your next appointment, please call your pharmacy.

## 2017-07-20 LAB — CUP PACEART REMOTE DEVICE CHECK
Battery Remaining Longevity: 103 mo
Battery Voltage: 3 V
Brady Statistic RV Percent Paced: 0.02 %
HIGH POWER IMPEDANCE MEASURED VALUE: 78 Ohm
Implantable Lead Implant Date: 20150422
Implantable Lead Model: 6935
Lead Channel Impedance Value: 399 Ohm
Lead Channel Impedance Value: 456 Ohm
Lead Channel Pacing Threshold Amplitude: 0.5 V
Lead Channel Sensing Intrinsic Amplitude: 13.125 mV
Lead Channel Setting Pacing Amplitude: 2 V
Lead Channel Setting Pacing Pulse Width: 0.4 ms
Lead Channel Setting Sensing Sensitivity: 0.3 mV
MDC IDC LEAD LOCATION: 753860
MDC IDC MSMT LEADCHNL RV PACING THRESHOLD PULSEWIDTH: 0.4 ms
MDC IDC MSMT LEADCHNL RV SENSING INTR AMPL: 13.125 mV
MDC IDC PG IMPLANT DT: 20150422
MDC IDC SESS DTM: 20190522121846

## 2017-07-22 ENCOUNTER — Ambulatory Visit (INDEPENDENT_AMBULATORY_CARE_PROVIDER_SITE_OTHER): Payer: Medicare Other | Admitting: General Practice

## 2017-07-22 DIAGNOSIS — Z7901 Long term (current) use of anticoagulants: Secondary | ICD-10-CM | POA: Diagnosis not present

## 2017-07-22 LAB — POCT INR: INR: 2.3 (ref 2.0–3.0)

## 2017-07-22 NOTE — Progress Notes (Signed)
I have reviewed and agree with note, evaluation, plan.   Danny Yackley, MD  

## 2017-07-22 NOTE — Patient Instructions (Addendum)
Pre visit review using our clinic review tool, if applicable. No additional management support is needed unless otherwise documented below in the visit note.  Continue to take 1/2 tablet daily except 1 tablet on Sunday, Tuesday and Thursday.  Re-check in 2 weeks.

## 2017-07-23 ENCOUNTER — Other Ambulatory Visit (HOSPITAL_COMMUNITY): Payer: Self-pay | Admitting: Internal Medicine

## 2017-07-27 ENCOUNTER — Other Ambulatory Visit: Payer: Self-pay | Admitting: General Practice

## 2017-07-27 ENCOUNTER — Other Ambulatory Visit (HOSPITAL_COMMUNITY): Payer: Self-pay | Admitting: Family Medicine

## 2017-07-27 MED ORDER — WARFARIN SODIUM 5 MG PO TABS: ORAL_TABLET | ORAL | 0 refills | Status: DC

## 2017-08-05 ENCOUNTER — Ambulatory Visit (INDEPENDENT_AMBULATORY_CARE_PROVIDER_SITE_OTHER): Payer: Medicare Other | Admitting: General Practice

## 2017-08-05 DIAGNOSIS — Z7901 Long term (current) use of anticoagulants: Secondary | ICD-10-CM | POA: Diagnosis not present

## 2017-08-05 LAB — POCT INR: INR: 2.4 (ref 2.0–3.0)

## 2017-08-05 NOTE — Patient Instructions (Addendum)
Pre visit review using our clinic review tool, if applicable. No additional management support is needed unless otherwise documented below in the visit note.  Continue to take 1/2 tablet daily except 1 tablet on Sunday, Tuesday and Thursday.  Re-check in 3 weeks.

## 2017-08-05 NOTE — Progress Notes (Signed)
I have reviewed and agree with note, evaluation, plan.   Turrell Severt, MD  

## 2017-08-11 ENCOUNTER — Other Ambulatory Visit: Payer: Self-pay | Admitting: Cardiology

## 2017-08-11 DIAGNOSIS — E785 Hyperlipidemia, unspecified: Secondary | ICD-10-CM

## 2017-08-14 ENCOUNTER — Other Ambulatory Visit: Payer: Self-pay | Admitting: Cardiology

## 2017-08-14 DIAGNOSIS — E785 Hyperlipidemia, unspecified: Secondary | ICD-10-CM

## 2017-08-26 ENCOUNTER — Ambulatory Visit (INDEPENDENT_AMBULATORY_CARE_PROVIDER_SITE_OTHER): Payer: Medicare Other | Admitting: General Practice

## 2017-08-26 DIAGNOSIS — Z7901 Long term (current) use of anticoagulants: Secondary | ICD-10-CM

## 2017-08-26 LAB — POCT INR: INR: 2.8 (ref 2.0–3.0)

## 2017-08-26 NOTE — Progress Notes (Signed)
I have reviewed and agree with note, evaluation, plan.   Kemyra August, MD  

## 2017-08-26 NOTE — Patient Instructions (Addendum)
Pre visit review using our clinic review tool, if applicable. No additional management support is needed unless otherwise documented below in the visit note.  Continue to take 1/2 tablet daily except 1 tablet on Sunday, Tuesday and Thursday.  Re-check in 4 weeks.

## 2017-08-31 ENCOUNTER — Ambulatory Visit (HOSPITAL_COMMUNITY)
Admission: RE | Admit: 2017-08-31 | Discharge: 2017-08-31 | Disposition: A | Discharge status: Home / Self Care | Payer: Medicare Other | Source: Ambulatory Visit | Attending: Internal Medicine | Admitting: Internal Medicine

## 2017-08-31 ENCOUNTER — Encounter (HOSPITAL_COMMUNITY): Payer: Self-pay | Admitting: Internal Medicine

## 2017-08-31 ENCOUNTER — Other Ambulatory Visit: Payer: Self-pay

## 2017-08-31 VITALS — BP 142/80 | HR 79 | Wt 254.5 lb

## 2017-08-31 DIAGNOSIS — I5022 Chronic systolic (congestive) heart failure: Secondary | ICD-10-CM | POA: Insufficient documentation

## 2017-08-31 DIAGNOSIS — I2510 Atherosclerotic heart disease of native coronary artery without angina pectoris: Secondary | ICD-10-CM | POA: Diagnosis not present

## 2017-08-31 DIAGNOSIS — I110 Hypertensive heart disease with heart failure: Secondary | ICD-10-CM | POA: Insufficient documentation

## 2017-08-31 DIAGNOSIS — I8290 Acute embolism and thrombosis of unspecified vein: Secondary | ICD-10-CM | POA: Insufficient documentation

## 2017-08-31 DIAGNOSIS — Z951 Presence of aortocoronary bypass graft: Secondary | ICD-10-CM | POA: Insufficient documentation

## 2017-08-31 DIAGNOSIS — E785 Hyperlipidemia, unspecified: Secondary | ICD-10-CM | POA: Insufficient documentation

## 2017-08-31 DIAGNOSIS — I255 Ischemic cardiomyopathy: Secondary | ICD-10-CM | POA: Diagnosis not present

## 2017-08-31 DIAGNOSIS — I252 Old myocardial infarction: Secondary | ICD-10-CM | POA: Insufficient documentation

## 2017-08-31 DIAGNOSIS — Z7982 Long term (current) use of aspirin: Secondary | ICD-10-CM | POA: Diagnosis not present

## 2017-08-31 DIAGNOSIS — Z7901 Long term (current) use of anticoagulants: Secondary | ICD-10-CM | POA: Diagnosis not present

## 2017-08-31 LAB — BASIC METABOLIC PANEL
Anion gap: 14 (ref 5–15)
BUN: 17 mg/dL (ref 8–23)
CALCIUM: 9.3 mg/dL (ref 8.9–10.3)
CHLORIDE: 105 mmol/L (ref 98–111)
CO2: 22 mmol/L (ref 22–32)
CREATININE: 1.17 mg/dL (ref 0.61–1.24)
GFR, EST NON AFRICAN AMERICAN: 55 mL/min — AB (ref >60)
Glucose, Bld: 177 mg/dL — ABNORMAL HIGH (ref 70–99)
Potassium: 4.2 mmol/L (ref 3.5–5.1)
SODIUM: 141 mmol/L (ref 135–145)

## 2017-08-31 NOTE — Patient Instructions (Signed)
Labs today  We will contact you in 6 months to schedule your next appointment.  

## 2017-08-31 NOTE — Progress Notes (Signed)
Patient ID: ZIAIRE HAGOS, male   DOB: 1930-02-22, 84 y.o.   MRN: [id number]  CARDIOLOGY CLINIC NOTE PCP: Garret Reddish HF MD: Dr Haroldine Laws  Patient ID: [name], male   DOB: 05/02/1930, 84 y.o.   MRN: [id number] HPI: Sohaib  is a pleasant 84 year old male with a history of coronary artery disease status post large anterior MI with coronary  bypass grafting in 2001 with a LIMA to the LAD.  He has history of  congestive heart failure secondary to resultant ischemic cardiomyopathy,  ejection fraction however is in the 35-40% (08/2005).  He is status post  single-chamber ICD.  He did have a nuclear study in March 01, 2006, which showed an EF of 38% a large anterior infarction, but no ischemia. ETT 11/2009 was normal (8:90min on [place])  The rest of his medical history is notable for obesity, hypertension,  hyperlipidemia, and glucose intolerance.  Today he returns for HF follow up. Overall feeling fine. Energy level improved.  Able to walk around [employer] without difficulty. Denies SOB/PND/Orthopnea. Mild dyspnea after several steps. Appetite ok. No fever or chills. Goes to [employer]. Able to walk 45 minutes at a time about 3 days a week. Walks at 2.5-3.26mph will use a little elevation at times. Can get HR up to 115.   No bleeding problems. Taking all medications   RHC/LHC 06/12/2017  Ao = 143/77 (105) LV 145/8 RA = 3 RV = 26/4 PA = 27/5 (12) PCW = 9 Fick cardiac output/index = 4.4/2.0 PVR = 0.7 WU FA sat = 97% PA sat = 62%, 63% Assessment: 1. Left-dominant coronary system with chronically-occluded LAD 2. Non-obstructive CAD elsewhere 3. Patent LIMA to LAD 4. LV gram not performed due to LV thrombus 5. Normal right heart pressure with mildly reduced cardiac output  ECHO 05/2017 EF 30-35% with Apical Thrombus--> Started on coumadin.  ECHO 06/2016 EF 35-40%   Echo 12/14 EF 30-35% + ICD (followed by Dr. Lovena Le) - ICD gen chang 06/01/13  Optivol: Thoracic impedence up. His fluid goes up and  down. Pt activity ~1-2 hrs daily.  Review of systems complete and found to be negative unless listed in HPI.    Past Medical History:  Diagnosis Date  . BPH (benign prostatic hypertrophy)   . CAD (coronary artery disease)    s/p CABG  . Diverticulosis   . Diverticulosis of colon without hemorrhage 10/22/2007   Qualifier: Diagnosis of  By: Linna Darner MD, [name]    . Excess weight   . Fasting hyperglycemia   . Heart attack ([place]) 2001  . Heart failure    CHF due to ischemic CM  . Hyperlipidemia   . Hypertension   . Ischemic cardiomyopathy   . Microscopic hematuria    [facility]    Current Outpatient Medications  Medication Sig Dispense Refill  . acetaminophen (TYLENOL) 500 MG tablet Take 500 mg by mouth at bedtime.     Marland Kitchen aspirin 81 MG tablet Take 1 tablet (81 mg total) by mouth every evening.    . carvedilol (COREG) 25 MG tablet Take 0.5 tablets (12.5 mg total) by mouth 2 (two) times daily. 180 tablet 3  . Cholecalciferol (VITAMIN D3) 2000 UNITS TABS Take 2,000 Units by mouth daily.     . folic acid (FOLVITE) 782 MCG tablet Take 400 mcg by mouth daily.      . furosemide (LASIX) 40 MG tablet Take 1 tablet (40 mg total) by mouth daily. 90 tablet 3  . Glucosamine HCl  1500 MG TABS Take 1,500 mg by mouth every evening.     Marland Kitchen losartan (COZAAR) 50 MG tablet Take 1 tablet (50 mg total) by mouth daily. 90 tablet 3  . Multiple Vitamin (MULTIVITAMIN WITH MINERALS) TABS Take 1 tablet by mouth daily.     . potassium chloride SA (K-DUR,KLOR-CON) 20 MEQ tablet Take 1 tablet (20 mEq total) daily by mouth. 90 tablet 3  . simvastatin (ZOCOR) 40 MG tablet TAKE ONE-HALF TABLET BY MOUTH AT BEDTIME 45 tablet 0  . spironolactone (ALDACTONE) 25 MG tablet TAKE 1/2 TABLET(12.5 MG) BY MOUTH DAILY 45 tablet 3  . warfarin (COUMADIN) 5 MG tablet Take as directed by anticoagulation clinic. 90 tablet 0   No current facility-administered medications for this encounter.    Vitals:   08/31/17 1147  BP: (!)  142/80  Pulse: 79  SpO2: 97%  Weight: 254 lb 8 oz (115.4 kg)   Wt Readings from Last 3 Encounters:  08/31/17 254 lb 8 oz (115.4 kg)  07/16/17 246 lb (111.6 kg)  06/12/17 239 lb (108.4 kg)    PHYSICAL EXAM: General:  Well appearing. No resp difficulty HEENT: normal Neck: supple. no JVD. Carotids 2+ bilat; no bruits. No lymphadenopathy or thryomegaly appreciated. Cor: PMI nondisplaced. Regular rate & rhythm. No rubs, gallops or murmurs. Lungs: clear Abdomen: soft, nontender, nondistended. No hepatosplenomegaly. No bruits or masses. Good bowel sounds. Extremities: no cyanosis, clubbing, rash, edema Neuro: alert & orientedx3, cranial nerves grossly intact. moves all 4 extremities w/o difficulty. Affect pleasant   ASSESSMENT & PLAN: 1) Increased dyspnea and fatigue Resolved with exercise.  2) CAD: s/p CABG 2001with LIMA to LAD  - As above. No clear exertional angina.  Will continue medical management for now (BB, statin, and ASA) - Recent cath with 5/19 with intact revascularization and normal filling pressures  3) Chronic systolic HF: ICM,  Most recent ECHo 05/2017 EF 30-35% with apical thrombus. [employer] ICD. Activity ~ 2hours per day. Fluid index ok. No VT/Afib  - Recent cath with 5/19 with intact revascularization and normal filling pressures  - Volume status ok. Continue lasix 40 mg daily.  - Continue 12.5 mg spiro daily.  - Continue coreg 12.5 mg twice a day + losartan 50 mg daily.  4) HTN - Blood pressure slightly elevated here but well controlled at home with SBP 125-130. Continue current regimen.  5) HLD - managed by PCP. Continue statin. No change.  6) Apical Thrombus On coumadin. No bleeding problems.   Check BMET .  FOllow up 3 months  Darrick Grinder, NP  12:05 PM   Patient seen and examined with the above-signed Advanced Practice Provider and/or Housestaff. I personally reviewed laboratory data, imaging studies and relevant notes. I independently examined the patient  and formulated the important aspects of the plan. I have edited the note to reflect any of my changes or salient points. I have personally discussed the plan with the patient and/or family.  Doing well. He feels much better with exercise regimen. Recent cath with stable revascularization and hemodynamics. Symptoms much improved with exercise regimen. No angina.  ICD reviewed personally. No VT or AF. Fluid ok.   Glori Bickers, MD  2:22 PM

## 2017-09-30 ENCOUNTER — Ambulatory Visit: Payer: Medicare Other

## 2017-09-30 ENCOUNTER — Ambulatory Visit (INDEPENDENT_AMBULATORY_CARE_PROVIDER_SITE_OTHER): Payer: Medicare Other | Admitting: *Deleted

## 2017-09-30 DIAGNOSIS — I255 Ischemic cardiomyopathy: Secondary | ICD-10-CM | POA: Diagnosis not present

## 2017-09-30 DIAGNOSIS — I5022 Chronic systolic (congestive) heart failure: Secondary | ICD-10-CM

## 2017-09-30 NOTE — Progress Notes (Signed)
Remote ICD transmission.   

## 2017-10-02 ENCOUNTER — Encounter: Payer: Self-pay | Admitting: Cardiology

## 2017-10-07 ENCOUNTER — Ambulatory Visit (INDEPENDENT_AMBULATORY_CARE_PROVIDER_SITE_OTHER): Payer: Medicare Other | Admitting: General Practice

## 2017-10-07 DIAGNOSIS — Z7901 Long term (current) use of anticoagulants: Secondary | ICD-10-CM | POA: Diagnosis not present

## 2017-10-07 LAB — POCT INR: INR: 3.5 — AB (ref 2.0–3.0)

## 2017-10-07 NOTE — Patient Instructions (Addendum)
Pre visit review using our clinic review tool, if applicable. No additional management support is needed unless otherwise documented below in the visit note.  Hold dosage today and then continue to take 1/2 tablet daily except 1 tablet on Sunday, Tuesday and Thursday.  Re-check in 3 weeks.

## 2017-10-07 NOTE — Progress Notes (Signed)
I have reviewed and agree with note, evaluation, plan.   Stephen Hunter, MD  

## 2017-10-19 ENCOUNTER — Other Ambulatory Visit (HOSPITAL_COMMUNITY): Payer: Self-pay | Admitting: Internal Medicine

## 2017-10-19 DIAGNOSIS — I5022 Chronic systolic (congestive) heart failure: Secondary | ICD-10-CM

## 2017-10-28 ENCOUNTER — Other Ambulatory Visit: Payer: Self-pay | Admitting: Cardiology

## 2017-10-28 ENCOUNTER — Ambulatory Visit (INDEPENDENT_AMBULATORY_CARE_PROVIDER_SITE_OTHER): Payer: Medicare Other | Admitting: General Practice

## 2017-10-28 DIAGNOSIS — E785 Hyperlipidemia, unspecified: Secondary | ICD-10-CM

## 2017-10-28 DIAGNOSIS — Z7901 Long term (current) use of anticoagulants: Secondary | ICD-10-CM | POA: Diagnosis not present

## 2017-10-28 LAB — POCT INR: INR: 3.1 — AB (ref 2.0–3.0)

## 2017-10-28 NOTE — Progress Notes (Signed)
I have reviewed and agree with note, evaluation, plan.   Callie Facey, MD  

## 2017-10-28 NOTE — Patient Instructions (Addendum)
Pre visit review using our clinic review tool, if applicable. No additional management support is needed unless otherwise documented below in the visit note.  Change dosage and take 1/2 tablet daily except 1 tablet on Sunday and Thursday.  Re-check in 4 weeks.

## 2017-11-01 ENCOUNTER — Other Ambulatory Visit: Payer: Self-pay | Admitting: Family Medicine

## 2017-11-03 LAB — CUP PACEART REMOTE DEVICE CHECK
Battery Remaining Longevity: 98 mo
Battery Voltage: 3.01 V
Brady Statistic RV Percent Paced: 0.03 %
Date Time Interrogation Session: 20190821052404
HIGH POWER IMPEDANCE MEASURED VALUE: 70 Ohm
Lead Channel Impedance Value: 456 Ohm
Lead Channel Impedance Value: 513 Ohm
Lead Channel Pacing Threshold Pulse Width: 0.4 ms
Lead Channel Sensing Intrinsic Amplitude: 13.625 mV
Lead Channel Setting Pacing Amplitude: 2 V
Lead Channel Setting Pacing Pulse Width: 0.4 ms
MDC IDC LEAD IMPLANT DT: 20150422
MDC IDC LEAD LOCATION: 753860
MDC IDC MSMT LEADCHNL RV PACING THRESHOLD AMPLITUDE: 0.5 V
MDC IDC MSMT LEADCHNL RV SENSING INTR AMPL: 13.625 mV
MDC IDC PG IMPLANT DT: 20150422
MDC IDC SET LEADCHNL RV SENSING SENSITIVITY: 0.3 mV

## 2017-11-17 ENCOUNTER — Ambulatory Visit (INDEPENDENT_AMBULATORY_CARE_PROVIDER_SITE_OTHER): Payer: Medicare Other | Admitting: Family Medicine

## 2017-11-17 ENCOUNTER — Encounter: Payer: Self-pay | Admitting: Family Medicine

## 2017-11-17 VITALS — BP 134/76 | HR 71 | Temp 98.2°F | Ht 71.0 in | Wt 250.6 lb

## 2017-11-17 DIAGNOSIS — E1159 Type 2 diabetes mellitus with other circulatory complications: Secondary | ICD-10-CM

## 2017-11-17 DIAGNOSIS — E1169 Type 2 diabetes mellitus with other specified complication: Secondary | ICD-10-CM

## 2017-11-17 DIAGNOSIS — R059 Cough, unspecified: Secondary | ICD-10-CM

## 2017-11-17 DIAGNOSIS — E785 Hyperlipidemia, unspecified: Secondary | ICD-10-CM | POA: Diagnosis not present

## 2017-11-17 DIAGNOSIS — E1151 Type 2 diabetes mellitus with diabetic peripheral angiopathy without gangrene: Secondary | ICD-10-CM | POA: Diagnosis not present

## 2017-11-17 DIAGNOSIS — I152 Hypertension secondary to endocrine disorders: Secondary | ICD-10-CM

## 2017-11-17 DIAGNOSIS — R05 Cough: Secondary | ICD-10-CM | POA: Diagnosis not present

## 2017-11-17 DIAGNOSIS — I10 Essential (primary) hypertension: Secondary | ICD-10-CM

## 2017-11-17 LAB — LIPID PANEL
CHOLESTEROL: 151 mg/dL (ref 0–200)
HDL: 39.9 mg/dL (ref >39.00)
NonHDL: 111.1
Total CHOL/HDL Ratio: 4
Triglycerides: 230 mg/dL — ABNORMAL HIGH (ref 0.0–149.0)
VLDL: 46 mg/dL — AB (ref 0.0–40.0)

## 2017-11-17 LAB — COMPREHENSIVE METABOLIC PANEL
ALBUMIN: 4.1 g/dL (ref 3.5–5.2)
ALK PHOS: 52 U/L (ref 39–117)
ALT: 26 U/L (ref 0–53)
AST: 22 U/L (ref 0–37)
BILIRUBIN TOTAL: 0.4 mg/dL (ref 0.2–1.2)
BUN: 19 mg/dL (ref 6–23)
CO2: 28 mEq/L (ref 19–32)
CREATININE: 1.16 mg/dL (ref 0.40–1.50)
Calcium: 9.6 mg/dL (ref 8.4–10.5)
Chloride: 105 mEq/L (ref 96–112)
GFR: 63.48 mL/min (ref >60.00)
GLUCOSE: 161 mg/dL — AB (ref 70–99)
Potassium: 4.4 mEq/L (ref 3.5–5.1)
Sodium: 138 mEq/L (ref 135–145)
Total Protein: 7.2 g/dL (ref 6.0–8.3)

## 2017-11-17 LAB — LDL CHOLESTEROL, DIRECT: Direct LDL: 77 mg/dL

## 2017-11-17 LAB — POCT GLYCOSYLATED HEMOGLOBIN (HGB A1C): Hemoglobin A1C: 6.6 % — AB (ref 4.0–5.6)

## 2017-11-17 NOTE — Patient Instructions (Addendum)
A1c looks great at 6.6!   Team please update PHQ9 today- his family stressors are better and want to see if this has improved  For cough, could be allergy related- try flonase over the counter for 3 weeks. If that doesn't help trial pepcid over the counter for a month twice a day  Please stop by lab before you go  Depression screen [facility] 2/9 11/17/2017 05/12/2017 05/12/2017 01/18/2016 01/19/2014  Decreased Interest 0 1 1 0 0  Down, Depressed, Hopeless 1 1 1  0 0  PHQ - 2 Score 1 2 2  0 0  Altered sleeping 2 1 1  - -  Tired, decreased energy 2 3 3  - -  Change in appetite 0 0 0 - -  Feeling bad or failure about yourself  1 2 2  - -  Trouble concentrating 0 0 0 - -  Moving slowly or fidgety/restless 0 0 0 - -  Suicidal thoughts 0 0 0 - -  PHQ-9 Score 6 8 8  - -  Difficult doing work/chores Not difficult at all Somewhat difficult Somewhat difficult - -

## 2017-11-17 NOTE — Progress Notes (Signed)
Subjective:  Joseph Malone is a 84 y.o. year old very pleasant male patient who presents for/with See problem oriented charting ROS- some chronic cough without shortness of breath. No chest pain. No increased edema.  Slight weight loss- has cut down on sugar intake recently    Past Medical History-  Patient Active Problem List   Diagnosis Date Noted  . Chronic systolic heart failure ([place]) 12/09/2010    Priority: High  . Cardiomyopathy, ischemic 10/02/2009    Priority: High  . Automatic implantable cardioverter-defibrillator in situ 01/16/2009    Priority: High  . DM (diabetes mellitus) type II, controlled, with peripheral vascular disorder ([place]) 09/14/2008    Priority: High  . CAD in native artery 09/25/2006    Priority: High  . Hyperlipidemia associated with type 2 diabetes mellitus ([place]) 04/22/2007    Priority: Medium  . Hypertension associated with diabetes ([place]) 09/25/2006    Priority: Medium  . Erectile dysfunction 01/19/2014    Priority: Low  . Basal cell cancer 09/29/2012    Priority: Low  . Solitary pulmonary nodule 07/12/2012    Priority: Low  . BPH without obstruction/lower urinary tract symptoms 10/22/2007    Priority: Low  . Osteoarthritis 09/25/2006    Priority: Low  . Hematuria 09/25/2006    Priority: Low  . Long term (current) use of anticoagulants 07/08/2017  . Nephrolithiasis 03/24/2016    Medications- reviewed and updated Current Outpatient Medications  Medication Sig Dispense Refill  . acetaminophen (TYLENOL) 500 MG tablet Take 500 mg by mouth at bedtime.     Marland Kitchen aspirin 81 MG tablet Take 1 tablet (81 mg total) by mouth every evening.    . carvedilol (COREG) 25 MG tablet TAKE ONE-HALF TABLET BY MOUTH TWICE DAILY 90 tablet 1  . Cholecalciferol (VITAMIN D3) 2000 UNITS TABS Take 2,000 Units by mouth daily.     . folic acid (FOLVITE) 595 MCG tablet Take 400 mcg by mouth daily.      . furosemide (LASIX) 40 MG tablet Take 1 tablet (40 mg total) by mouth  daily. 90 tablet 3  . Glucosamine HCl 1500 MG TABS Take 1,500 mg by mouth every evening.     Marland Kitchen losartan (COZAAR) 50 MG tablet Take 1 tablet (50 mg total) by mouth daily. 90 tablet 3  . Multiple Vitamin (MULTIVITAMIN WITH MINERALS) TABS Take 1 tablet by mouth daily.     . potassium chloride SA (K-DUR,KLOR-CON) 20 MEQ tablet Take 1 tablet (20 mEq total) daily by mouth. 90 tablet 3  . simvastatin (ZOCOR) 40 MG tablet TAKE ONE-HALF TABLET BY MOUTH AT BEDTIME 45 tablet 3  . spironolactone (ALDACTONE) 25 MG tablet TAKE 1/2 TABLET(12.5 MG) BY MOUTH DAILY 45 tablet 3  . warfarin (COUMADIN) 5 MG tablet Take 1 tablet daily or TAKE AS DIRECTED 90 tablet 0   No current facility-administered medications for this visit.     Objective: BP 134/76 (BP Location: Left Arm, Patient Position: Sitting, Cuff Size: Large)   Pulse 71   Temp 98.2 F (36.8 C) (Oral)   Ht 5\' 11"  (1.803 m)   Wt 250 lb 9.6 oz (113.7 kg)   SpO2 95%   BMI 34.95 kg/m  Gen: NAD, resting comfortably Some drainage/cobblestoning in pharynx CV: RRR no murmurs rubs or gallops Lungs: CTAB no crackles, wheeze, rhonchi Abdomen: soft/nontender/nondistended/normal bowel sounds. Ext: no edema Skin: warm, dry  Assessment/Plan:  Other notes: 1.phq9 of 8 last visit due to some situational stressors. Patient without anhedonia and overall feeling  better. Mild phq9 elevation at 6 but he does not want to pursue medicatoin or counseling   Cough x 1.5 months S:  1.5 months of cough. Dr. Haroldine Laws listened to lungs and told him not in his chest. Usually happens when lays down at night. Dry cough. Some runny nose. No glaucoma. Does get some reflux symptoms and had been treated in past by Dr. Linna Darner.  A/P: from avs "For cough, could be allergy related- try flonase over the counter for 3 weeks. If that doesn't help trial pepcid over the counter for a month twice a day"  DM (diabetes mellitus) type II, controlled, with peripheral vascular disorder  ([place]) S: diet controlled. Admits to some more sweets but cut down recently Lab Results  Component Value Date   HGBA1C 6.6 (A) 11/17/2017   HGBA1C 6.6 05/12/2017   HGBA1C 7.0 (H) 01/11/2016   A/P: continue without medication  Hypertension associated with diabetes ([place]) S: controlled on  coreg 12.5mg  BID, lasix 40mg  (skips doses at times but he reports cardiology told him this is ok as long as doesn't get fluid overload), losartan 50mg , spironolactone 12.5mg - still requires potassium BP Readings from Last 3 Encounters:  11/17/17 134/76  08/31/17 (!) 142/80  07/16/17 128/62  A/P: We discussed blood pressure goal of <140/90. Continue current meds  Hyperlipidemia associated with type 2 diabetes mellitus ([place]) S: well controlled on simvastatin 40mg  on last check but has been almost 2 years A/P: update lipids today- likely maintain current dose as long as LDL close to 70   Future Appointments  Date Time Provider [place]  11/25/2017  9:30 AM [facility]  12/30/2017 10:20 AM [facility] DEVICE REMOTES [facility]  05/20/2018  1:20 PM Marin Olp, MD [facility]   Return in about 6 months (around 05/19/2018) for physical.  Lab/Order associations: DM (diabetes mellitus) type II, controlled, with peripheral vascular disorder ([place]) - Plan: POCT glycosylated hemoglobin (Hb A1C), Lipid panel, Comprehensive metabolic panel  Hypertension associated with diabetes ([place])  Hyperlipidemia associated with type 2 diabetes mellitus ([place])  No orders of the defined types were placed in this encounter.   Return precautions advised.  Garret Reddish, MD

## 2017-11-18 NOTE — Assessment & Plan Note (Signed)
S: well controlled on simvastatin 40mg  on last check but has been almost 2 years A/P: update lipids today- likely maintain current dose as long as LDL close to 70

## 2017-11-18 NOTE — Assessment & Plan Note (Signed)
S: diet controlled. Admits to some more sweets but cut down recently Lab Results  Component Value Date   HGBA1C 6.6 (A) 11/17/2017   HGBA1C 6.6 05/12/2017   HGBA1C 7.0 (H) 01/11/2016   A/P: continue without medication

## 2017-11-18 NOTE — Assessment & Plan Note (Signed)
S: controlled on  coreg 12.5mg  BID, lasix 40mg  (skips doses at times but he reports cardiology told him this is ok as long as doesn't get fluid overload), losartan 50mg , spironolactone 12.5mg - still requires potassium BP Readings from Last 3 Encounters:  11/17/17 134/76  08/31/17 (!) 142/80  07/16/17 128/62  A/P: We discussed blood pressure goal of <140/90. Continue current meds

## 2017-11-25 ENCOUNTER — Ambulatory Visit (INDEPENDENT_AMBULATORY_CARE_PROVIDER_SITE_OTHER): Payer: Medicare Other | Admitting: General Practice

## 2017-11-25 DIAGNOSIS — Z7901 Long term (current) use of anticoagulants: Secondary | ICD-10-CM | POA: Diagnosis not present

## 2017-11-25 LAB — POCT INR: INR: 2.6 (ref 2.0–3.0)

## 2017-11-25 NOTE — Patient Instructions (Addendum)
Pre visit review using our clinic review tool, if applicable. No additional management support is needed unless otherwise documented below in the visit note.  Continue to take 1/2 tablet daily except 1 tablet on Sunday and Thursday.  Re-check in 4 weeks. 

## 2017-11-25 NOTE — Progress Notes (Signed)
I have reviewed and agree with note, evaluation, plan.   Genell Thede, MD  

## 2017-11-30 ENCOUNTER — Other Ambulatory Visit (HOSPITAL_COMMUNITY): Payer: Self-pay | Admitting: Internal Medicine

## 2017-12-23 ENCOUNTER — Ambulatory Visit (INDEPENDENT_AMBULATORY_CARE_PROVIDER_SITE_OTHER): Payer: Medicare Other | Admitting: General Practice

## 2017-12-23 DIAGNOSIS — Z7901 Long term (current) use of anticoagulants: Secondary | ICD-10-CM

## 2017-12-23 LAB — POCT INR: INR: 2.2 (ref 2.0–3.0)

## 2017-12-23 NOTE — Patient Instructions (Addendum)
Pre visit review using our clinic review tool, if applicable. No additional management support is needed unless otherwise documented below in the visit note.  Continue to take 1/2 tablet daily except 1 tablet on Sunday and Thursday.  Re-check in 4 weeks.

## 2017-12-23 NOTE — Progress Notes (Signed)
I have reviewed and agree with note, evaluation, plan.   Stephen Hunter, MD  

## 2017-12-30 ENCOUNTER — Ambulatory Visit (INDEPENDENT_AMBULATORY_CARE_PROVIDER_SITE_OTHER): Payer: Medicare Other

## 2017-12-30 DIAGNOSIS — I255 Ischemic cardiomyopathy: Secondary | ICD-10-CM

## 2017-12-30 DIAGNOSIS — I5022 Chronic systolic (congestive) heart failure: Secondary | ICD-10-CM

## 2017-12-31 NOTE — Progress Notes (Signed)
Remote ICD transmission.   

## 2018-01-20 ENCOUNTER — Ambulatory Visit (INDEPENDENT_AMBULATORY_CARE_PROVIDER_SITE_OTHER): Payer: Medicare Other | Admitting: General Practice

## 2018-01-20 DIAGNOSIS — Z7901 Long term (current) use of anticoagulants: Secondary | ICD-10-CM

## 2018-01-20 LAB — POCT INR: INR: 2.7 (ref 2.0–3.0)

## 2018-01-20 NOTE — Patient Instructions (Addendum)
Pre visit review using our clinic review tool, if applicable. No additional management support is needed unless otherwise documented below in the visit note.  Continue to take 1/2 tablet daily except 1 tablet on Sunday and Thursday.  Re-check in 6 weeks.  

## 2018-01-20 NOTE — Progress Notes (Signed)
I have reviewed and agree with note, evaluation, plan.   Janina Trafton, MD  

## 2018-02-24 LAB — CUP PACEART REMOTE DEVICE CHECK
Battery Remaining Longevity: 98 mo
Brady Statistic RV Percent Paced: 0.01 %
HighPow Impedance: 81 Ohm
Implantable Lead Model: 6935
Implantable Pulse Generator Implant Date: 20150422
Lead Channel Impedance Value: 418 Ohm
Lead Channel Impedance Value: 475 Ohm
Lead Channel Pacing Threshold Amplitude: 0.5 V
Lead Channel Pacing Threshold Pulse Width: 0.4 ms
Lead Channel Setting Pacing Pulse Width: 0.4 ms
Lead Channel Setting Sensing Sensitivity: 0.3 mV
MDC IDC LEAD IMPLANT DT: 20150422
MDC IDC LEAD LOCATION: 753860
MDC IDC MSMT BATTERY VOLTAGE: 3 V
MDC IDC MSMT LEADCHNL RV SENSING INTR AMPL: 15.5 mV
MDC IDC MSMT LEADCHNL RV SENSING INTR AMPL: 15.5 mV
MDC IDC SESS DTM: 20191120123723
MDC IDC SET LEADCHNL RV PACING AMPLITUDE: 2 V

## 2018-03-03 ENCOUNTER — Ambulatory Visit (INDEPENDENT_AMBULATORY_CARE_PROVIDER_SITE_OTHER): Payer: Medicare Other | Admitting: General Practice

## 2018-03-03 DIAGNOSIS — Z7901 Long term (current) use of anticoagulants: Secondary | ICD-10-CM | POA: Diagnosis not present

## 2018-03-03 LAB — POCT INR: INR: 2.8 (ref 2.0–3.0)

## 2018-03-03 NOTE — Patient Instructions (Addendum)
Pre visit review using our clinic review tool, if applicable. No additional management support is needed unless otherwise documented below in the visit note.  Continue to take 1/2 tablet daily except 1 tablet on Sunday and Thursday.  Re-check in 6 weeks.  

## 2018-03-03 NOTE — Progress Notes (Signed)
I have reviewed and agree with note, evaluation, plan.   Luccas Towell, MD  

## 2018-03-30 ENCOUNTER — Other Ambulatory Visit: Payer: Self-pay | Admitting: Family Medicine

## 2018-03-30 NOTE — Telephone Encounter (Signed)
Okay for refill?  

## 2018-03-31 ENCOUNTER — Ambulatory Visit (INDEPENDENT_AMBULATORY_CARE_PROVIDER_SITE_OTHER): Payer: Medicare Other

## 2018-03-31 DIAGNOSIS — I5022 Chronic systolic (congestive) heart failure: Secondary | ICD-10-CM

## 2018-03-31 DIAGNOSIS — I255 Ischemic cardiomyopathy: Secondary | ICD-10-CM | POA: Diagnosis not present

## 2018-04-02 LAB — CUP PACEART REMOTE DEVICE CHECK
Battery Remaining Longevity: 94 mo
Battery Voltage: 3 V
Brady Statistic RV Percent Paced: 0.01 %
HighPow Impedance: 76 Ohm
Implantable Lead Implant Date: 20150422
Implantable Lead Location: 753860
Implantable Lead Model: 6935
Implantable Pulse Generator Implant Date: 20150422
Lead Channel Impedance Value: 418 Ohm
Lead Channel Impedance Value: 475 Ohm
Lead Channel Pacing Threshold Amplitude: 0.5 V
Lead Channel Pacing Threshold Pulse Width: 0.4 ms
Lead Channel Sensing Intrinsic Amplitude: 14.75 mV
Lead Channel Setting Pacing Amplitude: 2 V
Lead Channel Setting Pacing Pulse Width: 0.4 ms
Lead Channel Setting Sensing Sensitivity: 0.3 mV
MDC IDC MSMT LEADCHNL RV SENSING INTR AMPL: 14.75 mV
MDC IDC SESS DTM: 20200219132828

## 2018-04-08 NOTE — Progress Notes (Signed)
Remote ICD transmission.   

## 2018-04-09 ENCOUNTER — Encounter: Payer: Self-pay | Admitting: Cardiology

## 2018-04-14 ENCOUNTER — Ambulatory Visit (INDEPENDENT_AMBULATORY_CARE_PROVIDER_SITE_OTHER): Payer: Medicare Other | Admitting: General Practice

## 2018-04-14 DIAGNOSIS — Z7901 Long term (current) use of anticoagulants: Secondary | ICD-10-CM

## 2018-04-14 LAB — POCT INR: INR: 1.8 — AB (ref 2.0–3.0)

## 2018-04-14 NOTE — Patient Instructions (Addendum)
Pre visit review using our clinic review tool, if applicable. No additional management support is needed unless otherwise documented below in the visit note.  Take 1 tablet today and then continue to take 1/2 tablet daily except 1 tablet on Sunday and Thursday.  Re-check in 6 weeks.  

## 2018-04-14 NOTE — Progress Notes (Signed)
I have reviewed and agree with note, evaluation, plan.   Randall Rampersad, MD  

## 2018-05-20 ENCOUNTER — Encounter: Payer: Medicare Other | Admitting: Family Medicine

## 2018-05-26 ENCOUNTER — Ambulatory Visit (INDEPENDENT_AMBULATORY_CARE_PROVIDER_SITE_OTHER): Payer: Medicare Other | Admitting: General Practice

## 2018-05-26 ENCOUNTER — Other Ambulatory Visit: Payer: Self-pay

## 2018-05-26 DIAGNOSIS — Z7901 Long term (current) use of anticoagulants: Secondary | ICD-10-CM

## 2018-05-26 LAB — POCT INR: INR: 3 (ref 2.0–3.0)

## 2018-05-26 NOTE — Patient Instructions (Addendum)
Pre visit review using our clinic review tool, if applicable. No additional management support is needed unless otherwise documented below in the visit note.  Take 1 tablet today and then continue to take 1/2 tablet daily except 1 tablet on Sunday and Thursday.  Re-check in 6 weeks.

## 2018-05-26 NOTE — Progress Notes (Signed)
I have reviewed and agree with note, evaluation, plan.   Ayonna Speranza, MD  

## 2018-06-02 ENCOUNTER — Other Ambulatory Visit: Payer: Self-pay

## 2018-06-02 ENCOUNTER — Encounter (HOSPITAL_COMMUNITY): Payer: Self-pay | Admitting: *Deleted

## 2018-06-02 ENCOUNTER — Ambulatory Visit (HOSPITAL_COMMUNITY)
Admission: RE | Admit: 2018-06-02 | Discharge: 2018-06-02 | Disposition: A | Discharge status: Home / Self Care | Payer: Medicare Other | Source: Ambulatory Visit | Attending: Internal Medicine | Admitting: Internal Medicine

## 2018-06-02 DIAGNOSIS — I2510 Atherosclerotic heart disease of native coronary artery without angina pectoris: Secondary | ICD-10-CM | POA: Diagnosis not present

## 2018-06-02 DIAGNOSIS — Z95810 Presence of automatic (implantable) cardiac defibrillator: Secondary | ICD-10-CM

## 2018-06-02 DIAGNOSIS — Z951 Presence of aortocoronary bypass graft: Secondary | ICD-10-CM

## 2018-06-02 DIAGNOSIS — I10 Essential (primary) hypertension: Secondary | ICD-10-CM

## 2018-06-02 DIAGNOSIS — E785 Hyperlipidemia, unspecified: Secondary | ICD-10-CM

## 2018-06-02 DIAGNOSIS — I110 Hypertensive heart disease with heart failure: Secondary | ICD-10-CM

## 2018-06-02 DIAGNOSIS — I5022 Chronic systolic (congestive) heart failure: Secondary | ICD-10-CM | POA: Diagnosis not present

## 2018-06-02 DIAGNOSIS — Z955 Presence of coronary angioplasty implant and graft: Secondary | ICD-10-CM

## 2018-06-02 NOTE — Progress Notes (Signed)
AVS sent to pt via mychart. 

## 2018-06-02 NOTE — Addendum Note (Signed)
Encounter addended by: Scarlette Calico, RN on: 06/02/2018 1:19 PM  Actions taken: Clinical Note Signed

## 2018-06-02 NOTE — Patient Instructions (Signed)
Please call our office in October to schedule your next appointment.  Please send [place] a mychart message or call our office at [phone] of you need anything before your next appointment.

## 2018-06-02 NOTE — Progress Notes (Signed)
Heart Failure TeleHealth Note  Due to national recommendations of social distancing due to [place] 19, Audio/video telehealth visit is felt to be most appropriate for this patient at this time.  See MyChart message from today for patient consent regarding telehealth for [facility].  Date:  06/02/2018   ID:  Joseph Malone, DOB 10/29/1930, MRN [id number]  Location: Home  Provider location: [facility] Type of Visit: Established patient  PCP:  Marin Olp, MD  Cardiologist:  No primary care provider on file. Primary HF:   Chief Complaint: Heart Failure follow-up   History of Present Illness:  Joseph Malone  is a pleasant 85 year old male with a history of coronary artery disease status post large anterior MI with coronary  bypass grafting in 2001 with a LIMA to the LAD.  He has history of  congestive heart failure secondary to resultant ischemic cardiomyopathy,  ejection fraction however is in the 35%.  He is status post  single-chamber ICD.   The rest of his medical history is notable for obesity, hypertension,  hyperlipidemia, and glucose intolerance.  Cath in 5/19 with stable CAD with patent LIMA to LAD.  Today he  presents via Engineer, civil (consulting) for a telehealth visit today. Says he is doing fine[facility] Says he feels weak. Able to do ADLs and mow the yard slowly. Denies SOB, PND orthopnea or PND. SBP  120-130s   No bleeding problems on warfarin.   RHC/LHC 06/12/2017  Ao = 143/77 (105) LV 145/8 RA = 3 RV = 26/4 PA = 27/5 (12) PCW = 9 Fick cardiac output/index = 4.4/2.0 PVR = 0.7 WU FA sat = 97% PA sat = 62%, 63% Assessment: 1. Left-dominant coronary system with chronically-occluded LAD 2. Non-obstructive CAD elsewhere 3. Patent LIMA to LAD 4. LV gram not performed due to LV thrombus 5. Normal right heart pressure with mildly reduced cardiac output  ECHO 05/2017 EF 30-35% with   [name] denies symptoms worrisome for COVID 19.   Past Medical History:  Diagnosis Date  . BPH (benign prostatic hypertrophy)   . CAD (coronary artery disease)    s/p CABG  . Diverticulosis   . Diverticulosis of colon without hemorrhage 10/22/2007   Qualifier: Diagnosis of  By: Linna Darner MD, [name]    . Excess weight   . Fasting hyperglycemia   . Heart attack ([place]) 2001  . Heart failure    CHF due to ischemic CM  . Hyperlipidemia   . Hypertension   . Ischemic cardiomyopathy   . Microscopic hematuria    [facility]   Past Surgical History:  Procedure Laterality Date  . APPENDECTOMY    . CARDIAC CATHETERIZATION  2000  . CARDIAC DEFIBRILLATOR PLACEMENT  2005   Medtronic; s/p ICD gen change and RV lead revision April 2015  . CATARACT EXTRACTION  2008   bilateral with lens implant  . COLONOSCOPY  2004   Tics; [facility]  . CORONARY ARTERY BYPASS GRAFT  2000   1 vessel  . CYSTOSCOPY  06/2012   Dr [name]  . IMPLANTABLE CARDIOVERTER DEFIBRILLATOR (ICD) GENERATOR CHANGE N/A 06/01/2013   Procedure: ICD GENERATOR CHANGE;  Surgeon: [name], MD;  Location: [facility] CATH LAB;  Service: Cardiovascular;  Laterality: N/A;  . LEAD REVISION N/A 06/01/2013   Procedure: LEAD REVISION;  Surgeon: [name], MD;  Location: [facility] CATH LAB;  Service: Cardiovascular;  Laterality: N/A;  .  PILONIDAL CYST EXCISION    . RIGHT/LEFT HEART CATH AND CORONARY/GRAFT ANGIOGRAPHY N/A 06/12/2017   Procedure: RIGHT/LEFT HEART CATH AND CORONARY/GRAFT ANGIOGRAPHY;  Surgeon: [name], MD;  Location: [facility] CV LAB;  Service: Cardiovascular;  Laterality: N/A;  . TRANSTHORACIC ECHOCARDIOGRAM  08/29/2005     Current Outpatient Medications  Medication Sig Dispense Refill  . acetaminophen (TYLENOL) 500 MG tablet Take 500 mg by mouth at bedtime.     Marland Kitchen aspirin 81 MG tablet Take 1 tablet (81 mg total) by mouth every evening.    . carvedilol (COREG) 25 MG tablet TAKE ONE-HALF TABLET BY MOUTH TWICE DAILY  90 tablet 1  . Cholecalciferol (VITAMIN D3) 2000 UNITS TABS Take 2,000 Units by mouth daily.     . folic acid (FOLVITE) 353 MCG tablet Take 400 mcg by mouth daily.      . furosemide (LASIX) 40 MG tablet Take 1 tablet (40 mg total) by mouth daily. 90 tablet 3  . Glucosamine HCl 1500 MG TABS Take 1,500 mg by mouth every evening.     Marland Kitchen losartan (COZAAR) 50 MG tablet Take 1 tablet (50 mg total) by mouth daily. 90 tablet 3  . Multiple Vitamin (MULTIVITAMIN WITH MINERALS) TABS Take 1 tablet by mouth daily.     . potassium chloride SA (K-DUR,KLOR-CON) 20 MEQ tablet TAKE 1 TABLET BY MOUTH DAILY, GENERIC EQUIVALENT FOR KLOR-CON 90 tablet 2  . simvastatin (ZOCOR) 40 MG tablet TAKE ONE-HALF TABLET BY MOUTH AT BEDTIME 45 tablet 3  . spironolactone (ALDACTONE) 25 MG tablet TAKE 1/2 TABLET(12.5 MG) BY MOUTH DAILY 45 tablet 3  . warfarin (COUMADIN) 5 MG tablet Take 1/2 tablet daily except take 1 tablet on Sunday and Tuesday or Take as directed by anticoagulation clinic  90 day 90 tablet 1   No current facility-administered medications for this encounter.     Allergies:   Penicillins   Social History:  The patient  reports that he is a non-smoker but has been exposed to tobacco smoke. He has never used smokeless tobacco. He reports that he does not drink alcohol or use drugs.   Family History:  The patient's family history includes Diabetes in his brother; Heart attack (age of onset: 12) in his brother, father, and paternal uncle; Hypertension in his father; Lung cancer in his sister; Sudden death (age of onset: 47) in his paternal grandfather.   ROS:  Please see the history of present illness.   All other systems are personally reviewed and negative.   Exam:  (Video/Tele Health Call; Exam is subjective and or/visual.) General:  Speaks in full sentences. No resp difficulty. Lungs: Normal respiratory effort with conversation.  Abdomen: Non-distended per patient report Extremities: Pt denies edema. Neuro:  Alert & oriented x 3.   Recent Labs: 06/09/2017: Hemoglobin 14.7; Platelets 168 11/17/2017: ALT 26; BUN 19; Creatinine, Ser 1.16; Potassium 4.4; Sodium 138  Personally reviewed   Wt Readings from Last 3 Encounters:  11/17/17 113.7 kg (250 lb 9.6 oz)  08/31/17 115.4 kg (254 lb 8 oz)  07/16/17 111.6 kg (246 lb)      ASSESSMENT AND PLAN:  1) CAD: s/p CABG 2001with LIMA to LAD  - No s/s angina.  - Recent cath with 5/19 with intact revascularization and normal filling pressures  - Consider CR at some point if energy level not improving.  3) Chronic systolic HF: ICM,  Most recent ECHo 05/2017 EF 30-35% with apical thrombus. Medtroinc ICD. No shocks - Recent cath with 5/19  with intact revascularization and normal filling pressures  - Volume status ok. Weight stable. No edema. Continue lasix 40 mg daily.  - Continue 12.5 mg spiro daily.  - Continue coreg 12.5 mg twice a day + losartan 50 mg daily.  4) HTN - Well controlled 120-130 range 5) HLD - managed by PCP. Continue statin. No change.  6) Apical Thrombus On coumadin. No bleeding problems.    F/u 6 months.   COVID screen The patient does not have any symptoms that suggest any further testing/ screening at this time.  Social distancing reinforced today.  Recommended follow-up:  As above  Relevant cardiac medications were reviewed at length with the patient today.   The patient does not have concerns regarding their medications at this time.   The following changes were made today:  As above  Today, I have spent 16 minutes with the patient with telehealth technology discussing the above issues .    Signed, Glori Bickers, MD  06/02/2018 12:15 PM  [facility] [address] Heart and [place] [address] [phone] (office) [phone] (fax)

## 2018-06-03 ENCOUNTER — Encounter (HOSPITAL_COMMUNITY): Payer: Medicare Other | Admitting: Internal Medicine

## 2018-06-18 ENCOUNTER — Other Ambulatory Visit (HOSPITAL_COMMUNITY): Payer: Self-pay | Admitting: Internal Medicine

## 2018-06-18 DIAGNOSIS — I5022 Chronic systolic (congestive) heart failure: Secondary | ICD-10-CM

## 2018-06-30 ENCOUNTER — Ambulatory Visit (INDEPENDENT_AMBULATORY_CARE_PROVIDER_SITE_OTHER): Payer: Medicare Other | Admitting: *Deleted

## 2018-06-30 ENCOUNTER — Other Ambulatory Visit: Payer: Self-pay

## 2018-06-30 DIAGNOSIS — I5022 Chronic systolic (congestive) heart failure: Secondary | ICD-10-CM | POA: Diagnosis not present

## 2018-06-30 DIAGNOSIS — I255 Ischemic cardiomyopathy: Secondary | ICD-10-CM

## 2018-06-30 LAB — CUP PACEART REMOTE DEVICE CHECK
Battery Remaining Longevity: 90 mo
Battery Voltage: 3 V
Brady Statistic RV Percent Paced: 0.01 %
Date Time Interrogation Session: 20200520062704
HighPow Impedance: 73 Ohm
Implantable Lead Implant Date: 20150422
Implantable Lead Location: 753860
Implantable Lead Model: 6935
Implantable Pulse Generator Implant Date: 20150422
Lead Channel Impedance Value: 456 Ohm
Lead Channel Impedance Value: 513 Ohm
Lead Channel Pacing Threshold Amplitude: 0.625 V
Lead Channel Pacing Threshold Pulse Width: 0.4 ms
Lead Channel Sensing Intrinsic Amplitude: 12.375 mV
Lead Channel Sensing Intrinsic Amplitude: 12.375 mV
Lead Channel Setting Pacing Amplitude: 2 V
Lead Channel Setting Pacing Pulse Width: 0.4 ms
Lead Channel Setting Sensing Sensitivity: 0.3 mV

## 2018-07-07 ENCOUNTER — Encounter: Payer: Self-pay | Admitting: Family Medicine

## 2018-07-07 ENCOUNTER — Other Ambulatory Visit: Payer: Self-pay

## 2018-07-07 ENCOUNTER — Ambulatory Visit (INDEPENDENT_AMBULATORY_CARE_PROVIDER_SITE_OTHER): Payer: Medicare Other | Admitting: Family Medicine

## 2018-07-07 ENCOUNTER — Other Ambulatory Visit: Payer: Medicare Other

## 2018-07-07 ENCOUNTER — Ambulatory Visit (INDEPENDENT_AMBULATORY_CARE_PROVIDER_SITE_OTHER): Payer: Medicare Other | Admitting: General Practice

## 2018-07-07 ENCOUNTER — Ambulatory Visit: Payer: Self-pay

## 2018-07-07 VITALS — BP 130/78

## 2018-07-07 DIAGNOSIS — Z7901 Long term (current) use of anticoagulants: Secondary | ICD-10-CM | POA: Diagnosis not present

## 2018-07-07 DIAGNOSIS — R059 Cough, unspecified: Secondary | ICD-10-CM

## 2018-07-07 DIAGNOSIS — R0602 Shortness of breath: Secondary | ICD-10-CM

## 2018-07-07 DIAGNOSIS — R05 Cough: Secondary | ICD-10-CM

## 2018-07-07 LAB — POCT INR: INR: 2.8 (ref 2.0–3.0)

## 2018-07-07 NOTE — Telephone Encounter (Signed)
I spoke with Dr. Yong Channel about patient call and he suggests patient be offered another provider to be seen today.  Patient scheduled today with Morene Rankins.

## 2018-07-07 NOTE — Progress Notes (Signed)
I have reviewed and agree with note, evaluation, plan.   Hezzie Karim, MD  

## 2018-07-07 NOTE — Progress Notes (Addendum)
Patient: Joseph Malone MRN: [id number] DOB: [birth year] PCP: Marin Olp, MD     I connected with [name] on 07/07/18 at 13:28 by a telephone encounter  and verified that I am speaking with the correct person using two identifiers.  Location patient: Home Location provider: [facility], Office Persons participating in this telephone call: Valere Dross and Dr. Rogers Blocker   I discussed the limitations of evaluation and management by telephone and the availability of in person appointments. The patient expressed understanding and agreed to proceed.   Subjective:  Chief Complaint  Patient presents with  . Cough  . Shortness of Breath    HPI: The patient is a 85 y.o. male who presents today for telephone encounter for weak spells, SOB and a mild productive cough. I asked him to explain weak spells and he states these have been going on for a long time as well. Is working with Dr. Haroldine Laws on this. He also has some intermittent chest pains that is also at his baseline and is seeing cardiologist for this.   The cough and SOB seems to be the only thing out of the ordinary for him. He started to cough about 3-4 days ago. Very rare production that has a little yellow tint to it. He does not smoke and not around any smokers. He has not been exposed to the Covid that he is aware of. He has not been out and about and mainly does pick up. He has been staying in. He has had no headaches, body aches or fevers. He has had a chill, but has had no thermometer. He states his knee pain has gotten a little bit worse. He has had no vomiting, diarrhea, stomach pain. He is also more short of breath from his baseline. (hx of CHF with EF of 30-35% and ICD in place).  He seems to get more short of breath with exertion. Feels fine sitting, driving, at rest. He doesn't feel like he can walk as far as he normally does in the past. He had a heart cath in April which was normal. He denies any swelling in his feet, no  orthopnea. Weight has been stable with no gain. He is able to talk in complete sentences with no issues.   Review of Systems  Constitutional: Positive for chills. Negative for diaphoresis, fatigue, fever and unexpected weight change.  HENT: Negative for congestion, rhinorrhea, sinus pressure and sinus pain.   Respiratory: Positive for cough and shortness of breath. Negative for wheezing.   Cardiovascular: Positive for chest pain (intermittent, at baseline. cath done 06/2018). Negative for palpitations and leg swelling.  Gastrointestinal: Negative for abdominal pain, diarrhea, nausea and vomiting.  Musculoskeletal: Positive for arthralgias. Negative for joint swelling and myalgias.  Skin: Negative for rash.  Neurological: Negative for dizziness and light-headedness.    Allergies Patient is allergic to penicillins.  Past Medical History Patient  has a past medical history of BPH (benign prostatic hypertrophy), CAD (coronary artery disease), Diverticulosis, Diverticulosis of colon without hemorrhage (10/22/2007), Excess weight, Fasting hyperglycemia, Heart attack ([place]) (2001), Heart failure, Hyperlipidemia, Hypertension, Ischemic cardiomyopathy, and Microscopic hematuria.  Surgical History Patient  has a past surgical history that includes Coronary artery bypass graft (2000); Appendectomy; Pilonidal cyst excision; Cataract extraction (2008); Colonoscopy (2004); transthoracic echocardiogram (08/29/2005); Cardiac defibrillator placement (2005); Cardiac catheterization (2000); Cystoscopy (06/2012); implantable cardioverter defibrillator (icd) generator change (N/A, 06/01/2013); Lead revision (N/A, 06/01/2013); and RIGHT/LEFT HEART CATH AND CORONARY/GRAFT ANGIOGRAPHY (N/A, 06/12/2017).  Family History Pateint's  family history includes Diabetes in his brother; Heart attack (age of onset: 10) in his brother, father, and paternal uncle; Hypertension in his father; Lung cancer in his sister; Sudden death (age of  onset: 69) in his paternal grandfather.  Social History Patient  reports that he is a non-smoker but has been exposed to tobacco smoke. He has never used smokeless tobacco. He reports that he does not drink alcohol or use drugs.    Objective: Vitals:   07/07/18 1340  BP: 130/78    There is no height or weight on file to calculate BMI.      Assessment/plan: 1. Cough Large differential including allergies, post nasal drip, GERD, CHF exacerbation, viral illness/covid. Due to age, co-morbidities and symptoms will have him call health department to see if we can test him for covid. Conservative therapy and staying at home discussed. Will also order some lab work as well, but history not very consistent with CHF exacerbation. Strict ER precautions given. Has f/u with PCP tomorrow.   2. Shortness of breath See above.    Return if symptoms worsen or fail to improve.  Spent >20 minutes on reviewing meds, history, plan of care, labs and treatment with patient.    Orma Flaming, MD [place]  07/07/2018

## 2018-07-07 NOTE — Telephone Encounter (Signed)
Patient called and says for the past 2 weeks, his ongoing SOB for over a year has gotten worse, especially when up moving around or working in the garden. He says he has a slight cough that is productive at times, no fever, no exposure to anyone with COVID, no travel. He says he's having more weakness than normal, he always has chest pain. He asked if he could go through a testing site for covid testing, I advised he would need to have a virtual visit with Dr. Yong Channel, he verbalized understanding. I called the office and spoke to [facility] who asked to speak to the patient, the call was connected successfully.  Answer Assessment - Initial Assessment Questions 1. RESPIRATORY STATUS: "Describe your breathing?" (e.g., wheezing, shortness of breath, unable to speak, severe coughing)      Shortness of breath 2. ONSET: "When did this breathing problem begin?"      A lot worse now, past 2 weeks 3. PATTERN "Does the difficult breathing come and go, or has it been constant since it started?"      Come and go, winded with activity 4. SEVERITY: "How bad is your breathing?" (e.g., mild, moderate, severe)    - MILD: No SOB at rest, mild SOB with walking, speaks normally in sentences, can lay down, no retractions, pulse < 100.    - MODERATE: SOB at rest, SOB with minimal exertion and prefers to sit, cannot lie down flat, speaks in phrases, mild retractions, audible wheezing, pulse 100-120.    - SEVERE: Very SOB at rest, speaks in single words, struggling to breathe, sitting hunched forward, retractions, pulse > 120      Mild 5. RECURRENT SYMPTOM: "Have you had difficulty breathing before?" If so, ask: "When was the last time?" and "What happened that time?"      Yes, ongoing for over a year 6. CARDIAC HISTORY: "Do you have any history of heart disease?" (e.g., heart attack, angina, bypass surgery, angioplasty)      Yes, heart attack 7. LUNG HISTORY: "Do you have any history of lung disease?"  (e.g., pulmonary  embolus, asthma, emphysema)     No 8. CAUSE: "What do you think is causing the breathing problem?"      No idea 9. OTHER SYMPTOMS: "Do you have any other symptoms? (e.g., dizziness, runny nose, cough, chest pain, fever)     Weakness, slight productive cough 10. PREGNANCY: "Is there any chance you are pregnant?" "When was your last menstrual period?"      No 11. TRAVEL: "Have you traveled out of the country in the last month?" (e.g., travel history, exposures)      No  Protocols used: BREATHING DIFFICULTY-A-AH

## 2018-07-07 NOTE — Patient Instructions (Addendum)
Pre visit review using our clinic review tool, if applicable. No additional management support is needed unless otherwise documented below in the visit note.  Continue to take 1/2 tablet daily except 1 tablet on Sunday and Thursday.  Re-check in 6 weeks.

## 2018-07-07 NOTE — Telephone Encounter (Signed)
See note. Maddy scheduled the patient for tomorrow due to wanting to see PCP.

## 2018-07-08 ENCOUNTER — Other Ambulatory Visit (INDEPENDENT_AMBULATORY_CARE_PROVIDER_SITE_OTHER): Payer: Medicare Other

## 2018-07-08 ENCOUNTER — Ambulatory Visit: Payer: Medicare Other | Admitting: Family Medicine

## 2018-07-08 DIAGNOSIS — R0602 Shortness of breath: Secondary | ICD-10-CM | POA: Diagnosis not present

## 2018-07-08 DIAGNOSIS — Z20828 Contact with and (suspected) exposure to other viral communicable diseases: Secondary | ICD-10-CM | POA: Diagnosis not present

## 2018-07-09 ENCOUNTER — Encounter: Payer: Self-pay | Admitting: Cardiology

## 2018-07-09 LAB — COMPREHENSIVE METABOLIC PANEL
ALT: 28 U/L (ref 0–53)
AST: 25 U/L (ref 0–37)
Albumin: 4.2 g/dL (ref 3.5–5.2)
Alkaline Phosphatase: 52 U/L (ref 39–117)
BUN: 21 mg/dL (ref 6–23)
CO2: 26 mEq/L (ref 19–32)
Calcium: 9.5 mg/dL (ref 8.4–10.5)
Chloride: 105 mEq/L (ref 96–112)
Creatinine, Ser: 1.31 mg/dL (ref 0.40–1.50)
GFR: 51.83 mL/min — ABNORMAL LOW (ref >60.00)
Glucose, Bld: 156 mg/dL — ABNORMAL HIGH (ref 70–99)
Potassium: 4.7 mEq/L (ref 3.5–5.1)
Sodium: 139 mEq/L (ref 135–145)
Total Bilirubin: 0.6 mg/dL (ref 0.2–1.2)
Total Protein: 6.9 g/dL (ref 6.0–8.3)

## 2018-07-09 LAB — SEDIMENTATION RATE: Sed Rate: 27 mm/hr — ABNORMAL HIGH (ref 0–20)

## 2018-07-09 LAB — TSH: TSH: 0.5 u[IU]/mL (ref 0.35–4.50)

## 2018-07-09 LAB — CBC WITH DIFFERENTIAL/PLATELET
Basophils Absolute: 0.1 10*3/uL (ref 0.0–0.1)
Basophils Relative: 0.9 % (ref 0.0–3.0)
Eosinophils Absolute: 0.1 10*3/uL (ref 0.0–0.7)
Eosinophils Relative: 1.8 % (ref 0.0–5.0)
HCT: 43 % (ref 39.0–52.0)
Hemoglobin: 15.1 g/dL (ref 13.0–17.0)
Lymphocytes Relative: 30.1 % (ref 12.0–46.0)
Lymphs Abs: 2.4 10*3/uL (ref 0.7–4.0)
MCHC: 35 g/dL (ref 30.0–36.0)
MCV: 92.6 fl (ref 78.0–100.0)
Monocytes Absolute: 0.6 10*3/uL (ref 0.1–1.0)
Monocytes Relative: 7.7 % (ref 3.0–12.0)
Neutro Abs: 4.7 10*3/uL (ref 1.4–7.7)
Neutrophils Relative %: 59.5 % (ref 43.0–77.0)
Platelets: 192 10*3/uL (ref 150.0–400.0)
RBC: 4.65 Mil/uL (ref 4.22–5.81)
RDW: 13.2 % (ref 11.5–15.5)
WBC: 7.8 10*3/uL (ref 4.0–10.5)

## 2018-07-09 LAB — BRAIN NATRIURETIC PEPTIDE: Pro B Natriuretic peptide (BNP): 81 pg/mL (ref 0.0–100.0)

## 2018-07-09 LAB — C-REACTIVE PROTEIN: CRP: <1 mg/dL (ref 0.5–20.0)

## 2018-07-09 NOTE — Progress Notes (Signed)
Remote ICD transmission.   

## 2018-07-15 ENCOUNTER — Telehealth (HOSPITAL_COMMUNITY): Payer: Self-pay

## 2018-07-15 MED ORDER — WARFARIN SODIUM 5 MG PO TABS: ORAL_TABLET | ORAL | 3 refills | Status: DC

## 2018-07-15 NOTE — Telephone Encounter (Signed)
Pt called requesting refills be sent to mail order pharmacy.  Pt also inquired about taking tizanidine muscle relaxant? Is it ok to take as he is on multiple heart medicines?  Routed to [facility]

## 2018-07-15 NOTE — Telephone Encounter (Signed)
Pt made aware and appreciative.

## 2018-07-15 NOTE — Telephone Encounter (Signed)
   I personally discussed with HF pharmacy, [facility]. Madaline Brilliant to try Tizanidine.   Amy Clegg  NP-C  1:30 PM

## 2018-07-29 ENCOUNTER — Telehealth: Payer: Self-pay | Admitting: *Deleted

## 2018-07-29 NOTE — Telephone Encounter (Signed)

## 2018-08-05 ENCOUNTER — Other Ambulatory Visit: Payer: Self-pay

## 2018-08-05 ENCOUNTER — Telehealth (INDEPENDENT_AMBULATORY_CARE_PROVIDER_SITE_OTHER): Payer: Medicare Other | Admitting: Internal Medicine

## 2018-08-05 DIAGNOSIS — I5022 Chronic systolic (congestive) heart failure: Secondary | ICD-10-CM

## 2018-08-05 NOTE — Progress Notes (Signed)
Electrophysiology TeleHealth Note   Due to national recommendations of social distancing due to COVID 19, an audio/video telehealth visit is felt to be most appropriate for this patient at this time.  See MyChart message from today for the patient's consent to telehealth for [facility].   Date:  08/05/2018   ID:  Joseph Malone, DOB 08-11-1930, MRN [id number]  Location: patient's home  Provider location: [address] [place]  Evaluation Performed: Follow-up visit  PCP:  Marin Olp, MD  Cardiologist:  No primary care provider on file.  Electrophysiologist:  Dr Lovena Le  Chief Complaint:  "I'm ok."  History of Present Illness:    Joseph Malone is a 85 y.o. male who presents via audio/video conferencing for a telehealth visit today. He has a h/o CAD, s/p anterior MI, s/p CABG in the past. He has LV dysfunction with an EF of 35%. He underwent ICD insertion in 2015 and a heart cath in 5/20. EF was 30%.  Since last being seen in our clinic, the patient reports doing very well.  Today, he denies symptoms of palpitations, chest pain, shortness of breath,  lower extremity edema, dizziness, presyncope, or syncope.  The patient is otherwise without complaint today.  The patient denies symptoms of fevers, chills, cough, or new SOB worrisome for COVID 19.  Past Medical History:  Diagnosis Date  . BPH (benign prostatic hypertrophy)   . CAD (coronary artery disease)    s/p CABG  . Diverticulosis   . Diverticulosis of colon without hemorrhage 10/22/2007   Qualifier: Diagnosis of  By: Linna Darner MD, [name]    . Excess weight   . Fasting hyperglycemia   . Heart attack ([place]) 2001  . Heart failure    CHF due to ischemic CM  . Hyperlipidemia   . Hypertension   . Ischemic cardiomyopathy   . Microscopic hematuria    [facility]    Past Surgical History:  Procedure Laterality Date  . APPENDECTOMY    . CARDIAC CATHETERIZATION  2000  . CARDIAC DEFIBRILLATOR  PLACEMENT  2005   [employer]tr[employer]; s/p ICD gen change and RV lead revision April 2015  . CATARACT EXTRACTION  2008   bilateral with lens implant  . COLONOSCOPY  2004   Tics; [facility]  . CORONARY ARTERY BYPASS GRAFT  2000   1 vessel  . CYSTOSCOPY  06/2012   Dr [name]  . IMPLANTABLE CARDIOVERTER DEFIBRILLATOR (ICD) GENERATOR CHANGE N/A 06/01/2013   Procedure: ICD GENERATOR CHANGE;  Surgeon: [name], MD;  Location: [facility] CATH LAB;  Service: Cardiovascular;  Laterality: N/A;  . LEAD REVISION N/A 06/01/2013   Procedure: LEAD REVISION;  Surgeon: [name], MD;  Location: [facility] CATH LAB;  Service: Cardiovascular;  Laterality: N/A;  . PILONIDAL CYST EXCISION    . RIGHT/LEFT HEART CATH AND CORONARY/GRAFT ANGIOGRAPHY N/A 06/12/2017   Procedure: RIGHT/LEFT HEART CATH AND CORONARY/GRAFT ANGIOGRAPHY;  Surgeon: [name], MD;  Location: [facility] CV LAB;  Service: Cardiovascular;  Laterality: N/A;  . TRANSTHORACIC ECHOCARDIOGRAM  08/29/2005    Current Outpatient Medications  Medication Sig Dispense Refill  . acetaminophen (TYLENOL) 500 MG tablet Take 500 mg by mouth at bedtime.     Marland Kitchen aspirin 81 MG tablet Take 1 tablet (81 mg total) by mouth every evening.    . carvedilol (COREG) 25 MG tablet TAKE ONE-HALF TABLET BY MOUTH TWICE DAILY 90 tablet 1  . Cholecalciferol (VITAMIN D3) 2000 UNITS TABS Take 2,000  Units by mouth daily.     . folic acid (FOLVITE) 161 MCG tablet Take 400 mcg by mouth daily.      . furosemide (LASIX) 40 MG tablet Take 1 tablet (40 mg total) by mouth daily. 90 tablet 3  . Glucosamine HCl 1500 MG TABS Take 1,500 mg by mouth every evening.     Marland Kitchen losartan (COZAAR) 50 MG tablet TAKE 1 TABLET BY MOUTH DAILY. GENERIC EQUIVALENT FOR COZAAR 90 tablet 3  . Multiple Vitamin (MULTIVITAMIN WITH MINERALS) TABS Take 1 tablet by mouth daily.     . potassium chloride SA (K-DUR,KLOR-CON) 20 MEQ tablet TAKE 1 TABLET BY MOUTH DAILY, GENERIC EQUIVALENT FOR KLOR-CON 90 tablet 2  .  simvastatin (ZOCOR) 40 MG tablet TAKE ONE-HALF TABLET BY MOUTH AT BEDTIME 45 tablet 3  . spironolactone (ALDACTONE) 25 MG tablet TAKE 1/2 TABLET(12.5 MG) BY MOUTH DAILY 45 tablet 3  . warfarin (COUMADIN) 5 MG tablet Take 1/2 tablet daily except take 1 tablet on Sunday and Tuesday or Take as directed by anticoagulation clinic  90 day 90 tablet 3   No current facility-administered medications for this visit.     Allergies:   Penicillins   Social History:  The patient  reports that he is a non-smoker but has been exposed to tobacco smoke. He has never used smokeless tobacco. He reports that he does not drink alcohol or use drugs.   Family History:  The patient's  family history includes Diabetes in his brother; Heart attack (age of onset: 61) in his brother, father, and paternal uncle; Hypertension in his father; Lung cancer in his sister; Sudden death (age of onset: 44) in his paternal grandfather.   ROS:  Please see the history of present illness.   All other systems are personally reviewed and negative.    Exam:    Vital Signs:  BP - 124/78, P - 72   Labs/Other Tests and Data Reviewed:    Recent Labs: 07/08/2018: ALT 28; BUN 21; Creatinine, Ser 1.31; Hemoglobin 15.1; Platelets 192.0; Potassium 4.7; Pro B Natriuretic peptide (BNP) 81.0; Sodium 139; TSH 0.50   Wt Readings from Last 3 Encounters:  11/17/17 250 lb 9.6 oz (113.7 kg)  08/31/17 254 lb 8 oz (115.4 kg)  07/16/17 246 lb (111.6 kg)     Other studies personally reviewed: Additional studies/ records that were reviewed today include:   Last device remote is reviewed from [facility] PDF dated 06/30/18 which reveals normal device function, no arrhythmias    ASSESSMENT & PLAN:    1.  ICD - His Medtronic single chamber ICD is working normally.  2. CAD - he has been sedentary but denies anginal symptoms. I encouraged him to increase his physical activity. 3. Chronic systolic heart failure - his symptoms are class 2. He will  continue his current meds. I asked him to increase his walking. 4. COVID 19 screen The patient denies symptoms of COVID 19 at this time.  The importance of social distancing was discussed today.  Follow-up:  6 months Next remote: 8/20  Current medicines are reviewed at length with the patient today.   The patient does not have concerns regarding his medicines.  The following changes were made today:  none  Labs/ tests ordered today include: none No orders of the defined types were placed in this encounter.    Patient Risk:  after full review of this patients clinical status, I feel that they are at moderate risk at this time.  Today,  I have spent 15 minutes with the patient with telehealth technology discussing all of the above. Loma Messing, MD  08/05/2018 1:56 PM     [place] [address] [phone] (office) [phone] (fax)

## 2018-08-18 ENCOUNTER — Other Ambulatory Visit: Payer: Self-pay

## 2018-08-18 ENCOUNTER — Ambulatory Visit (INDEPENDENT_AMBULATORY_CARE_PROVIDER_SITE_OTHER): Payer: Medicare Other | Admitting: General Practice

## 2018-08-18 DIAGNOSIS — Z7901 Long term (current) use of anticoagulants: Secondary | ICD-10-CM

## 2018-08-18 LAB — POCT INR: INR: 2.5 (ref 2.0–3.0)

## 2018-08-18 NOTE — Patient Instructions (Signed)
Pre visit review using our clinic review tool, if applicable. No additional management support is needed unless otherwise documented below in the visit note.  Continue to take 1/2 tablet daily except 1 tablet on Sunday and Thursday.  Re-check in 6 weeks.

## 2018-08-23 ENCOUNTER — Other Ambulatory Visit: Payer: Self-pay

## 2018-08-23 ENCOUNTER — Ambulatory Visit (INDEPENDENT_AMBULATORY_CARE_PROVIDER_SITE_OTHER): Payer: Medicare Other | Admitting: Family Medicine

## 2018-08-23 ENCOUNTER — Encounter: Payer: Self-pay | Admitting: Family Medicine

## 2018-08-23 VITALS — BP 122/80 | HR 93 | Temp 98.1°F | Ht 71.0 in | Wt 250.2 lb

## 2018-08-23 DIAGNOSIS — I5022 Chronic systolic (congestive) heart failure: Secondary | ICD-10-CM

## 2018-08-23 DIAGNOSIS — I152 Hypertension secondary to endocrine disorders: Secondary | ICD-10-CM

## 2018-08-23 DIAGNOSIS — E785 Hyperlipidemia, unspecified: Secondary | ICD-10-CM

## 2018-08-23 DIAGNOSIS — E1169 Type 2 diabetes mellitus with other specified complication: Secondary | ICD-10-CM

## 2018-08-23 DIAGNOSIS — I2510 Atherosclerotic heart disease of native coronary artery without angina pectoris: Secondary | ICD-10-CM

## 2018-08-23 DIAGNOSIS — I255 Ischemic cardiomyopathy: Secondary | ICD-10-CM

## 2018-08-23 DIAGNOSIS — Z0000 Encounter for general adult medical examination without abnormal findings: Secondary | ICD-10-CM | POA: Diagnosis not present

## 2018-08-23 DIAGNOSIS — E1159 Type 2 diabetes mellitus with other circulatory complications: Secondary | ICD-10-CM

## 2018-08-23 DIAGNOSIS — E669 Obesity, unspecified: Secondary | ICD-10-CM

## 2018-08-23 DIAGNOSIS — Z95810 Presence of automatic (implantable) cardiac defibrillator: Secondary | ICD-10-CM

## 2018-08-23 DIAGNOSIS — I10 Essential (primary) hypertension: Secondary | ICD-10-CM

## 2018-08-23 DIAGNOSIS — E1151 Type 2 diabetes mellitus with diabetic peripheral angiopathy without gangrene: Secondary | ICD-10-CM | POA: Diagnosis not present

## 2018-08-23 LAB — POCT GLYCOSYLATED HEMOGLOBIN (HGB A1C): Hemoglobin A1C: 7 % — AB (ref 4.0–5.6)

## 2018-08-23 MED ORDER — TIZANIDINE HCL 2 MG PO TABS: 2.0000 mg | ORAL_TABLET | Freq: Every day | ORAL | 0 refills | Status: DC | PRN

## 2018-08-23 NOTE — Progress Notes (Signed)
Phone: [phone]    Subjective:   Patient presents today for their annual wellness visit.    Preventive Screening-Counseling & Management  Smoking Status: Never Smoker Second Hand Smoking status: No smokers in home Alcohol intake: 0 per week-does not drink  Risk Factors Regular exercise: Walking 7 days a week now after discussion with cardiology! Diet: Poor food choices- particularly with eating out a lot-we discussed how this can make it challenging to lose weight Fall Risk: None  Fall Risk  08/23/2018 05/12/2017 05/12/2017 01/18/2016 01/19/2014  Falls in the past year? 0 No No No No  Number falls in past yr: 0 - - - -  Injury with Fall? 0 - - - -  Opioid use history:  no long term opioids use  Cardiac risk factors:  advanced age (older than 53 for men, 22 for women)  Known hyperlipidemia-on statin Known hypertension-controlled Diet controlled diabetes.  Known CAD Family History: Yes in father and brother-CAD  Depression Screen None. PHQ 9 reasonably controlled Depression screen [facility] 2/9 08/23/2018 11/17/2017 05/12/2017 05/12/2017 01/18/2016  Decreased Interest 0 0 1 1 0  Down, Depressed, Hopeless 0 1 1 1  0  PHQ - 2 Score 0 1 2 2  0  Altered sleeping 1 2 1 1  -  Tired, decreased energy 1 2 3 3  -  Change in appetite 0 0 0 0 -  Feeling bad or failure about yourself  0 1 2 2  -  Trouble concentrating 1 0 0 0 -  Moving slowly or fidgety/restless 0 0 0 0 -  Suicidal thoughts 0 0 0 0 -  PHQ-9 Score 3 6 8 8  -  Difficult doing work/chores Somewhat difficult Not difficult at all Somewhat difficult Somewhat difficult -   Activities of Daily Living Independent ADLs and IADLs   Hearing Difficulties: -patient endorses-he declines referral to audiology right now-he was agreeable to take a handout and will consider when things are in more stable position with COVID-19  Cognitive Testing             No reported trouble.   Mini cog normal-normal clock draw and 1 out of 3 recall delayed.   List the Names of Other Physician/Practitioners you currently use: -Dr. Nevada Crane dermatology -Dr. Haroldine Laws cardiology -Dr. Lovena Le of electrophysiology/cardiology  Immunization History  Administered Date(s) Administered  . Influenza,inj,Quad PF,6+ Mos 11/11/2012  . Influenza-Unspecified 10/25/2013, 10/27/2014, 10/24/2015, 10/19/2017  . Pneumococcal Conjugate-13 08/21/2014  . Pneumococcal Polysaccharide-23 10/02/2009  . Td 09/17/2009  . Zoster 12/29/2012   Required Immunizations needed today-fully up-to-date other than Shingrix which we deferred due to overlap of COVID-19 symptoms with side effects of Shingrix Health Maintenance  Topic Date Due  . Eye exam for diabetics  05/06/2018  . Complete foot exam   05/13/2018  . Hemoglobin A1C  05/19/2018  . Flu Shot  09/11/2018  . Tetanus Vaccine  09/18/2019  . Pneumonia vaccines  Completed   Screening tests- up to date 1.  Colon cancer screening-passed age based screening 2.  Prostate cancer screening-passed age based screening 3.  Lung cancer screening-not a candidate 4.  Skin cancer screening-follows with dermatology regularly 5.  Sees ophthalmology regularly but wants a new referral in town-referral placed today.  ROS- No pertinent positives discovered in course of AWV  The following were reviewed and entered/updated in epic: Past Medical History:  Diagnosis Date  . BPH (benign prostatic hypertrophy)   . CAD (coronary artery disease)    s/p CABG  . Diverticulosis   . Diverticulosis  of colon without hemorrhage 10/22/2007   Qualifier: Diagnosis of  By: Linna Darner MD, [name]    . Excess weight   . Fasting hyperglycemia   . Heart attack ([place]) 2001  . Heart failure    CHF due to ischemic CM  . Hyperlipidemia   . Hypertension   . Ischemic cardiomyopathy   . Microscopic hematuria    [facility]   Patient Active Problem List   Diagnosis Date Noted  . Chronic systolic heart failure ([place]) 12/09/2010    Priority: High  .  Cardiomyopathy, ischemic 10/02/2009    Priority: High  . Automatic implantable cardioverter-defibrillator in situ 01/16/2009    Priority: High  . DM (diabetes mellitus) type II, controlled, with peripheral vascular disorder ([place]) 09/14/2008    Priority: High  . CAD in native artery 09/25/2006    Priority: High  . Nephrolithiasis 03/24/2016    Priority: Medium  . Hyperlipidemia associated with type 2 diabetes mellitus ([place]) 04/22/2007    Priority: Medium  . Hypertension associated with diabetes ([place]) 09/25/2006    Priority: Medium  . Long term (current) use of anticoagulants 07/08/2017    Priority: Low  . Erectile dysfunction 01/19/2014    Priority: Low  . Basal cell cancer 09/29/2012    Priority: Low  . Solitary pulmonary nodule 07/12/2012    Priority: Low  . BPH without obstruction/lower urinary tract symptoms 10/22/2007    Priority: Low  . Osteoarthritis 09/25/2006    Priority: Low  . Hematuria 09/25/2006    Priority: Low   Past Surgical History:  Procedure Laterality Date  . APPENDECTOMY    . CARDIAC CATHETERIZATION  2000  . CARDIAC DEFIBRILLATOR PLACEMENT  2005   [employer]; s/p ICD gen change and RV lead revision April 2015  . CATARACT EXTRACTION  2008   bilateral with lens implant  . COLONOSCOPY  2004   Tics; [facility]  . CORONARY ARTERY BYPASS GRAFT  2000   1 vessel  . CYSTOSCOPY  06/2012   Dr [name]  . IMPLANTABLE CARDIOVERTER DEFIBRILLATOR (ICD) GENERATOR CHANGE N/A 06/01/2013   Procedure: ICD GENERATOR CHANGE;  Surgeon: [name], MD;  Location[facility] C[facility] LAB;  Service: Cardiovascular;  Laterality: N/A;  . LEAD REVISION N/A 06/01/2013   Procedure: LEAD REVISION;  Surgeon: [name], MD;  Location: [facility] CATH LAB;  Service: Cardiovascular;  Laterality: N/A;  . PILONIDAL CYST EXCISION    . RIGHT/LEFT HEART CATH AND CORONARY/GRAFT ANGIOGRAPHY N/A 06/12/2017   Procedure: RIGHT/LEFT HEART CATH AND CORONARY/GRAFT ANGIOGRAPHY;  Surgeon: [name], MD;   Location: [place] CV LAB;  Service: Cardiovascular;  Laterality: N/A;  . TRANSTHORACIC ECHOCARDIOGRAM  08/29/2005    Family History  Problem Relation Age of Onset  . Hypertension Father   . Heart attack Father 69  . Diabetes Brother        X 2  . Heart attack Brother 29  . Heart attack Paternal Uncle 65  . Sudden death Paternal Grandfather 15       ? heat stroke  . Lung cancer Sister        "Asian lung cancer"  . Stroke Neg Hx     Medications- reviewed and updated Current Outpatient Medications  Medication Sig Dispense Refill  . acetaminophen (TYLENOL) 500 MG tablet Take 500 mg by mouth at bedtime.     Marland Kitchen aspirin 81 MG tablet Take 1 tablet (81 mg total) by mouth every evening.    . carvedilol (COREG) 25 MG tablet TAKE  ONE-HALF TABLET BY MOUTH TWICE DAILY 90 tablet 1  . Cholecalciferol (VITAMIN D3) 2000 UNITS TABS Take 2,000 Units by mouth daily.     . folic acid (FOLVITE) 009 MCG tablet Take 400 mcg by mouth daily.      . furosemide (LASIX) 40 MG tablet Take 1 tablet (40 mg total) by mouth daily. 90 tablet 3  . Glucosamine HCl 1500 MG TABS Take 1,500 mg by mouth every evening.     Marland Kitchen losartan (COZAAR) 50 MG tablet TAKE 1 TABLET BY MOUTH DAILY. GENERIC EQUIVALENT FOR COZAAR 90 tablet 3  . Multiple Vitamin (MULTIVITAMIN WITH MINERALS) TABS Take 1 tablet by mouth daily.     . potassium chloride SA (K-DUR,KLOR-CON) 20 MEQ tablet TAKE 1 TABLET BY MOUTH DAILY, GENERIC EQUIVALENT FOR KLOR-CON 90 tablet 2  . simvastatin (ZOCOR) 40 MG tablet TAKE ONE-HALF TABLET BY MOUTH AT BEDTIME 45 tablet 3  . spironolactone (ALDACTONE) 25 MG tablet TAKE 1/2 TABLET(12.5 MG) BY MOUTH DAILY 45 tablet 3  . warfarin (COUMADIN) 5 MG tablet Take 1/2 tablet daily except take 1 tablet on Sunday and Tuesday or Take as directed by anticoagulation clinic  90 day 90 tablet 3  . tiZANidine (ZANAFLEX) 2 MG tablet Take 1 tablet (2 mg total) by mouth daily as needed for muscle spasms. 90 tablet 0   No current  facility-administered medications for this visit.     Allergies-reviewed and updated Allergies  Allergen Reactions  . Penicillins Rash and Other (See Comments)    Because of a history of documented adverse serious drug reaction;Medi Alert bracelet  is recommended Has patient had a PCN reaction causing immediate rash, facial/tongue/throat swelling, SOB or lightheadedness with hypotension: No Has patient had a PCN reaction causing severe rash involving mucus membranes or skin necrosis: No Has patient had a PCN reaction that required hospitalization: No Has patient had a PCN reaction occurring within the last 10 years: No If all of the above answers are "NO", t    Social History   Socioeconomic History  . Marital status: Married    Spouse name: Not on file  . Number of children: Not on file  . Years of education: Not on file  . Highest education level: Not on file  Occupational History  . Not on file  Social Needs  . Financial resource strain: Not on file  . Food insecurity    Worry: Not on file    Inability: Not on file  . Transportation needs    Medical: Not on file    Non-medical: Not on file  Tobacco Use  . Smoking status: Passive Smoke Exposure - Never Smoker  . Smokeless tobacco: Never Used  Substance and Sexual Activity  . Alcohol use: No  . Drug use: No  . Sexual activity: Never  Lifestyle  . Physical activity    Days per week: Not on file    Minutes per session: Not on file  . Stress: Not on file  Relationships  . Social Herbalist on phone: Not on file    Gets together: Not on file    Attends religious service: Not on file    Active member of club or organization: Not on file    Attends meetings of clubs or organizations: Not on file    Relationship status: Not on file  Other Topics Concern  . Not on file  Social History Narrative   Married 1957. 4 children 2 boys 2 girls. 15 grandkids.  4 greatgrandkids.       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       Hobbies: genealogy, travel      No HCPOA-advised to do this.       Objective:  BP 122/80 (BP Location: Left Arm, Patient Position: Sitting, Cuff Size: Normal)   Pulse 93   Temp 98.1 F (36.7 C) (Oral)   Ht 5\' 11"  (1.803 m)   Wt 250 lb 3.2 oz (113.5 kg)   SpO2 95%   BMI 34.90 kg/m  Gen: NAD, resting comfortably HEENT: Mucous membranes are moist. Oropharynx normal Neck: no thyromegaly CV: RRR no murmurs rubs or gallops Lungs: CTAB no crackles, wheeze, rhonchi Abdomen: soft/nontender/nondistended/normal bowel sounds. No rebound or guarding.  Ext: no edema Skin: warm, dry Neuro: grossly normal, moves all extremities, PERRLA   Assessment/Plan:  AWV completed- discussed recommended screenings and documented any personalized health advice and referrals for preventive counseling. See AVS as well which was given to patient.   Status of chronic or acute concerns  See assessment during physical note  Recommended follow up: 54-month follow-up for diabetes recommended Future Appointments  Date Time Provider [place]  09/29/2018  8:00 AM [facility] DEVICE REMOTES [facility]  09/29/2018 10:00 AM [facility]MADIN CLINIC [facility]  01/25/2019  1:00 PM Marin Olp, MD [facility]     Lab/Order associations:   ICD-10-CM   1. Preventative health care  Z00.00    Return precautions advised. Garret Reddish, MD

## 2018-08-23 NOTE — Patient Instructions (Addendum)
Recommended follow up: 4-6 month follow up. Schedule before you leave      [name] , Thank you for taking time to come for your Medicare Wellness Visit. I appreciate your ongoing commitment to your health goals. Please review the following plan we discussed and let me know if I can assist you in the future.   These are the goals we discussed: 1.  Try zanaflex for back as needed 2. Can skip labs today as had full lipid panel within a year and full labs in may.  3. We will call you within two weeks about your referral to [facility] optho as above. If you do not hear within 3 weeks, give [place] a call.  4. You do have diet controlled diabetes Lab Results  Component Value Date   HGBA1C 7.0 (A) 08/23/2018  want you to continue to work on exercise and healthy eating and hopefully we can get this closer to 6.5 next visit   This is a list of the screening recommended for you and due dates:  Health Maintenance  Topic Date Due  . Eye exam for diabetics  05/06/2018  . Complete foot exam   05/13/2018  . Hemoglobin A1C  05/19/2018  . Flu Shot  09/11/2018  . Tetanus Vaccine  09/18/2019  . Pneumonia vaccines  Completed

## 2018-08-23 NOTE — Progress Notes (Signed)
Phone: [phone]   Subjective:  Patient presents today for their annual physical. Chief complaint-noted.   See problem oriented charting- ROS- full  review of systems was completed and negative except for: back pain, nasal congestion at times, runny nose if gets hot or cold, sneezing at times, shortness of breath and chest pain issues that cardiology is well aware of - not worsening and has had extensive prior eval, recently passed kidney stone  The following were reviewed and entered/updated in epic: Past Medical History:  Diagnosis Date  . BPH (benign prostatic hypertrophy)   . CAD (coronary artery disease)    s/p CABG  . Diverticulosis   . Diverticulosis of colon without hemorrhage 10/22/2007   Qualifier: Diagnosis of  By: Linna Darner MD, [name]    . Excess weight   . Fasting hyperglycemia   . Heart attack ([place]) 2001  . Heart failure    CHF due to ischemic CM  . Hyperlipidemia   . Hypertension   . Ischemic cardiomyopathy   . Microscopic hematuria    [facility]   Patient Active Problem List   Diagnosis Date Noted  . Chronic systolic heart failure ([place]) 12/09/2010    Priority: High  . Cardiomyopathy, ischemic 10/02/2009    Priority: High  . Automatic implantable cardioverter-defibrillator in situ 01/16/2009    Priority: High  . DM (diabetes mellitus) type II, controlled, with peripheral vascular disorder ([place]) 09/14/2008    Priority: High  . CAD in native artery 09/25/2006    Priority: High  . Hyperlipidemia associated with type 2 diabetes mellitus ([place]) 04/22/2007    Priority: Medium  . Hypertension associated with diabetes ([place]) 09/25/2006    Priority: Medium  . Erectile dysfunction 01/19/2014    Priority: Low  . Basal cell cancer 09/29/2012    Priority: Low  . Solitary pulmonary nodule 07/12/2012    Priority: Low  . BPH without obstruction/lower urinary tract symptoms 10/22/2007    Priority: Low  . Osteoarthritis 09/25/2006    Priority: Low  . Hematuria  09/25/2006    Priority: Low  . Long term (current) use of anticoagulants 07/08/2017  . Nephrolithiasis 03/24/2016   Past Surgical History:  Procedure Laterality Date  . APPENDECTOMY    . CARDIAC CATHETERIZATION  2000  . CARDIAC DEFIBRILLATOR PLACEMENT  2005   [employer]; s/p ICD gen change and RV lead revision April 2015  . CATARACT EXTRACTION  2008   bilateral with lens implant  . COLONOSCOPY  2004   Tics; [facility]  . CORONARY ARTERY BYPASS GRAFT  2000   1 vessel  . CYSTOSCOPY  06/2012   Dr [name]  . IMPLANTABLE CARDIOVERTER DEFIBRILLATOR (ICD) GENERATOR CHANGE N/A 06/01/2013   Procedure: ICD GENERATOR CHANGE;  Surgeon: [name], MD;  Location: [facility] CATH LAB;  Service: Cardiovascular;  Laterality: N/A;  . LEAD REVISION N/A 06/01/2013   Procedure: LEAD REVISION;  Surgeon: [name], MD;  Location: [facility] CATH LAB;  Service: Cardiovascular;  Laterality: N/A;  . PILONIDAL CYST EXCISION    . RIGHT/LEFT HEART CATH AND CORONARY/GRAFT ANGIOGRAPHY N/A 06/12/2017   Procedure: RIGHT/LEFT HEART CATH AND CORONARY/GRAFT ANGIOGRAPHY;  Surgeon: [name], MD;  Location: [place] CV LAB;  Service: Cardiovascular;  Laterality: N/A;  . TRANSTHORACIC ECHOCARDIOGRAM  08/29/2005    Family History  Problem Relation Age of Onset  . Hypertension Father   . Heart attack Father 72  . Diabetes Brother        X 2  . Heart attack  Brother 4  . Heart attack Paternal Uncle 68  . Sudden death Paternal Grandfather 21       ? heat stroke  . Lung cancer Sister        "Asian lung cancer"  . Stroke Neg Hx     Medications- reviewed and updated Current Outpatient Medications  Medication Sig Dispense Refill  . acetaminophen (TYLENOL) 500 MG tablet Take 500 mg by mouth at bedtime.     Marland Kitchen aspirin 81 MG tablet Take 1 tablet (81 mg total) by mouth every evening.    . carvedilol (COREG) 25 MG tablet TAKE ONE-HALF TABLET BY MOUTH TWICE DAILY 90 tablet 1  . Cholecalciferol (VITAMIN D3) 2000 UNITS  TABS Take 2,000 Units by mouth daily.     . folic acid (FOLVITE) 627 MCG tablet Take 400 mcg by mouth daily.      . furosemide (LASIX) 40 MG tablet Take 1 tablet (40 mg total) by mouth daily. 90 tablet 3  . Glucosamine HCl 1500 MG TABS Take 1,500 mg by mouth every evening.     Marland Kitchen losartan (COZAAR) 50 MG tablet TAKE 1 TABLET BY MOUTH DAILY. GENERIC EQUIVALENT FOR COZAAR 90 tablet 3  . Multiple Vitamin (MULTIVITAMIN WITH MINERALS) TABS Take 1 tablet by mouth daily.     . potassium chloride SA (K-DUR,KLOR-CON) 20 MEQ tablet TAKE 1 TABLET BY MOUTH DAILY, GENERIC EQUIVALENT FOR KLOR-CON 90 tablet 2  . simvastatin (ZOCOR) 40 MG tablet TAKE ONE-HALF TABLET BY MOUTH AT BEDTIME 45 tablet 3  . spironolactone (ALDACTONE) 25 MG tablet TAKE 1/2 TABLET(12.5 MG) BY MOUTH DAILY 45 tablet 3  . warfarin (COUMADIN) 5 MG tablet Take 1/2 tablet daily except take 1 tablet on Sunday and Tuesday or Take as directed by anticoagulation clinic  90 day 90 tablet 3  . tiZANidine (ZANAFLEX) 2 MG tablet Take 1 tablet (2 mg total) by mouth daily as needed for muscle spasms. 90 tablet 0   No current facility-administered medications for this visit.     Allergies-reviewed and updated Allergies  Allergen Reactions  . Penicillins Rash and Other (See Comments)    Because of a history of documented adverse serious drug reaction;Medi Alert bracelet  is recommended Has patient had a PCN reaction causing immediate rash, facial/tongue/throat swelling, SOB or lightheadedness with hypotension: No Has patient had a PCN reaction causing severe rash involving mucus membranes or skin necrosis: No Has patient had a PCN reaction that required hospitalization: No Has patient had a PCN reaction occurring within the last 10 years: No If all of the above answers are "NO", t    Social History   Social History Narrative   Married 1957. 4 children 2 boys 2 girls. 15 grandkids.  4 greatgrandkids.       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       Hobbies: genealogy, travel      No HCPOA-advised to do this.    Objective  Objective:  BP 122/80 (BP Location: Left Arm, Patient Position: Sitting, Cuff Size: Normal)   Pulse 93   Temp 98.1 F (36.7 C) (Oral)   Ht 5\' 11"  (1.803 m)   Wt 250 lb 3.2 oz (113.5 kg)   SpO2 95%   BMI 34.90 kg/m  Gen: NAD, resting comfortably HEENT: Mucous membranes are moist. Oropharynx normal Neck: no thyromegaly or cervical lymphadenopathy CV: RRR no murmurs rubs or gallops Lungs: CTAB no crackles, wheeze, rhonchi Abdomen: soft/nontender/nondistended/normal bowel sounds.  No rebound or guarding.  Ext: no edema Skin: warm, dry, lipoma noted on forehead stable Neuro: grossly normal, moves all extremities, PERRLA  Diabetic Foot Exam - Simple   Simple Foot Form Diabetic Foot exam was performed with the following findings: Yes 08/23/2018  2:43 PM  Visual Inspection See comments: Yes Sensation Testing See comments: Yes Pulse Check Posterior Tibialis and Dorsalis pulse intact bilaterally: Yes Comments Onychomycosis noted. Decreased monofilament on right greater than left bottom of foot- intermittently missed on several spots       Assessment and Plan  85 y.o. male presenting for annual physical.  Health Maintenance counseling: 1. Anticipatory guidance: Patient counseled regarding regular dental exams -q6 months, eye exams - twice yearly,  avoiding smoking and second hand smoke , limiting alcohol to 2 beverages per day - no alcohol at present.   2. Risk factor reduction:  Advised patient of need for regular exercise and diet rich and fruits and vegetables to reduce risk of heart attack and stroke. Exercise- walking 15 minutes a day almost 7 days a week. Diet-is trying to watch salt intake due to heart issues, tries to watch sweets- eats out a lot which isnt best for weight loss.  Wt Readings from Last 3 Encounters:  08/23/18 250 lb 3.2 oz (113.5 kg)   11/17/17 250 lb 9.6 oz (113.7 kg)  08/31/17 254 lb 8 oz (115.4 kg)  3. Immunizations/screenings/ancillary studies- delay shingrix due to overlap of side effects with covid 19 symptoms Immunization History  Administered Date(s) Administered  . Influenza,inj,Quad PF,6+ Mos 11/11/2012  . Influenza-Unspecified 10/25/2013, 10/27/2014, 10/24/2015, 10/19/2017  . Pneumococcal Conjugate-13 08/21/2014  . Pneumococcal Polysaccharide-23 10/02/2009  . Td 09/17/2009  . Zoster 12/29/2012   Health Maintenance Due  Topic Date Due  . OPHTHALMOLOGY EXAM - refer today 05/06/2018  . FOOT EXAM - today  05/13/2018  . HEMOGLOBIN A1C - today with labs 05/19/2018   4. Prostate cancer screening- passed age based screening  Lab Results  Component Value Date   PSA 0.58 10/22/2007   PSA 0.67 09/25/2006   5. Colon cancer screening - passed age based screening- no blood in stool 6. Skin cancer screening- Dr. Nevada Crane prn dermatology yearly. advised regular sunscreen use. Denies worrisome, changing, or new skin lesions.  7. Never smoker  Status of chronic or acute concerns   Diabetes Lab Results  Component Value Date   HGBA1C 7.0 (A) 08/23/2018  - controlled with diet/exercise but trending up- discussed eating at home more  Chronic systolic CHF likely from ischemic cardiomyopathy/HTN - Taking Carvedilol 25 mg 1/2 tablet BID, Furosemide 40 mg, Potassium 20 MEQ, Losartan 50 mg, and Spironolactone 25 mg 1/2 tablet daily. No missed doses, no side effects. Reports occasional HA and SOB x several months (cardiologist aware). Denies Dizziness, visual changes, CP. Checks BP at home, staying consistently below 140/90. Does not add salt to food. On exercise program per cardiology. Has been walking 15 mins daily. No recent weight gains or swelling in legs as long as compliant with lasix -defibrillator in place nad had recent check- has never activated - remains on coumadin for apical thrombus  CAD/Hyperlipidemia - Taking  Simvastatin 40 mg 1/2 tablet (fatigue on higher dose) and Aspirin 81 mg daily. No missed doses and no side effects. No chest pain.  -Lipids with mild poor control but has not tolerated higher doses of statin -Stable/intermittent chest pain/shortness of breath issues not worsening-cardiology is aware and patient will continue to follow-up with them  BPH -  pretty stable. Nocturia once a night  ED -  Not active right now  Low back issues- had some old tizanidine and found very helpful when muscle spasms at night and helped him sleep- asks for refill and provided today  Labs - back in may- will monitor kidney function today and update lipid panel   Recommended follow up: 4-6 month follow up Future Appointments  Date Time Provider [place]  09/29/2018  8:00 AM [facility] DEVICE REMOTES [facility]  09/29/2018 10:00 AM [facility]IN [facility]   Lab/Order associations:  not fasting   ICD-10-CM   1. DM (diabetes mellitus) type II, controlled, with peripheral vascular disorder (HCC)  E11.51 POCT glycosylated hemoglobin (Hb A1C)    Ambulatory referral to Ophthalmology  2. Preventative health care  Z00.00   3. CAD in native artery  I25.10   4. Hyperlipidemia associated with type 2 diabetes mellitus ([place])  E11.69    E78.5   5. Hypertension associated with diabetes ([place])  E11.59    I10   6. Obesity (BMI 30-39.9)  E66.9     Meds ordered this encounter  Medications  . tiZANidine (ZANAFLEX) 2 MG tablet    Sig: Take 1 tablet (2 mg total) by mouth daily as needed for muscle spasms.    Dispense:  90 tablet    Refill:  0    Return precautions advised.  Garret Reddish, MD

## 2018-09-27 ENCOUNTER — Other Ambulatory Visit (HOSPITAL_COMMUNITY): Payer: Self-pay | Admitting: Internal Medicine

## 2018-09-29 ENCOUNTER — Ambulatory Visit (INDEPENDENT_AMBULATORY_CARE_PROVIDER_SITE_OTHER): Payer: Medicare Other | Admitting: General Practice

## 2018-09-29 ENCOUNTER — Ambulatory Visit (INDEPENDENT_AMBULATORY_CARE_PROVIDER_SITE_OTHER): Payer: Medicare Other | Admitting: *Deleted

## 2018-09-29 DIAGNOSIS — Z7901 Long term (current) use of anticoagulants: Secondary | ICD-10-CM | POA: Diagnosis not present

## 2018-09-29 DIAGNOSIS — I5022 Chronic systolic (congestive) heart failure: Secondary | ICD-10-CM

## 2018-09-29 DIAGNOSIS — I255 Ischemic cardiomyopathy: Secondary | ICD-10-CM

## 2018-09-29 LAB — POCT INR: INR: 3.1 — AB (ref 2.0–3.0)

## 2018-09-29 NOTE — Patient Instructions (Addendum)
Pre visit review using our clinic review tool, if applicable. No additional management support is needed unless otherwise documented below in the visit note.  Hold coumadin today (8/19) and then continue to take 1/2 tablet daily except 1 tablet on Sunday and Thursday.  Re-check in 4 weeks.

## 2018-09-29 NOTE — Progress Notes (Signed)
I have reviewed and agree with note, evaluation, plan.   Stephen Hunter, MD  

## 2018-09-30 LAB — CUP PACEART REMOTE DEVICE CHECK
Battery Remaining Longevity: 86 mo
Battery Voltage: 3 V
Brady Statistic RV Percent Paced: 0.01 %
Date Time Interrogation Session: 20200819052304
HighPow Impedance: 78 Ohm
Implantable Lead Implant Date: 20150422
Implantable Lead Location: 753860
Implantable Lead Model: 6935
Implantable Pulse Generator Implant Date: 20150422
Lead Channel Impedance Value: 456 Ohm
Lead Channel Impedance Value: 532 Ohm
Lead Channel Pacing Threshold Amplitude: 0.5 V
Lead Channel Pacing Threshold Pulse Width: 0.4 ms
Lead Channel Sensing Intrinsic Amplitude: 13.875 mV
Lead Channel Sensing Intrinsic Amplitude: 13.875 mV
Lead Channel Setting Pacing Amplitude: 2 V
Lead Channel Setting Pacing Pulse Width: 0.4 ms
Lead Channel Setting Sensing Sensitivity: 0.3 mV

## 2018-09-30 NOTE — Progress Notes (Signed)
Patient presents with rectal bleeding, dark school stools, increasing fatigue x1 week.  He is currently on Coumadin and aspirin. Significant history of cardiomyopathy, diabetes, hypertension, nephrolithiasis.  Vitals:   10/01/18 1314  BP: 100/60  Pulse: 81  Temp: 98.3 F (36.8 C)  SpO2: 97%   BP Readings from Last 3 Encounters:  10/01/18 100/60  08/23/18 122/80  07/07/18 130/78   I did not do physical exam on patient.  Instead I instructed him to go to the emergency room for further evaluation and treatment.  Patient verbalized understanding to plan.  He states that he would proceed to [employer].  He declined EMS transport and stated that he could drive himself.  Worsening precautions discussed and recommended that he stop driving and call EMS if any of his symptoms worsen.  No charge for visit today.  Inda Coke PA-C

## 2018-10-01 ENCOUNTER — Encounter (HOSPITAL_BASED_OUTPATIENT_CLINIC_OR_DEPARTMENT_OTHER): Payer: Self-pay | Admitting: *Deleted

## 2018-10-01 ENCOUNTER — Encounter: Payer: Self-pay | Admitting: Physician Assistant

## 2018-10-01 ENCOUNTER — Telehealth: Payer: Self-pay

## 2018-10-01 ENCOUNTER — Other Ambulatory Visit: Payer: Self-pay

## 2018-10-01 ENCOUNTER — Ambulatory Visit: Payer: Medicare Other | Admitting: Physician Assistant

## 2018-10-01 ENCOUNTER — Emergency Department (HOSPITAL_BASED_OUTPATIENT_CLINIC_OR_DEPARTMENT_OTHER)
Admission: EM | Admit: 2018-10-01 | Discharge: 2018-10-01 | Disposition: A | Discharge status: Home / Self Care | Payer: Medicare Other | Attending: Emergency Medicine | Admitting: Emergency Medicine

## 2018-10-01 VITALS — BP 100/60 | HR 81 | Temp 98.3°F | Ht 71.0 in | Wt 244.0 lb

## 2018-10-01 DIAGNOSIS — Z95810 Presence of automatic (implantable) cardiac defibrillator: Secondary | ICD-10-CM | POA: Insufficient documentation

## 2018-10-01 DIAGNOSIS — I2510 Atherosclerotic heart disease of native coronary artery without angina pectoris: Secondary | ICD-10-CM | POA: Diagnosis not present

## 2018-10-01 DIAGNOSIS — Z951 Presence of aortocoronary bypass graft: Secondary | ICD-10-CM | POA: Diagnosis not present

## 2018-10-01 DIAGNOSIS — I5022 Chronic systolic (congestive) heart failure: Secondary | ICD-10-CM | POA: Insufficient documentation

## 2018-10-01 DIAGNOSIS — K921 Melena: Secondary | ICD-10-CM | POA: Diagnosis not present

## 2018-10-01 DIAGNOSIS — I110 Hypertensive heart disease with heart failure: Secondary | ICD-10-CM | POA: Insufficient documentation

## 2018-10-01 DIAGNOSIS — K625 Hemorrhage of anus and rectum: Secondary | ICD-10-CM

## 2018-10-01 DIAGNOSIS — E119 Type 2 diabetes mellitus without complications: Secondary | ICD-10-CM | POA: Insufficient documentation

## 2018-10-01 DIAGNOSIS — Z7982 Long term (current) use of aspirin: Secondary | ICD-10-CM | POA: Insufficient documentation

## 2018-10-01 DIAGNOSIS — Z7901 Long term (current) use of anticoagulants: Secondary | ICD-10-CM | POA: Diagnosis not present

## 2018-10-01 DIAGNOSIS — R42 Dizziness and giddiness: Secondary | ICD-10-CM | POA: Diagnosis not present

## 2018-10-01 DIAGNOSIS — Z85828 Personal history of other malignant neoplasm of skin: Secondary | ICD-10-CM | POA: Diagnosis not present

## 2018-10-01 DIAGNOSIS — Z7722 Contact with and (suspected) exposure to environmental tobacco smoke (acute) (chronic): Secondary | ICD-10-CM | POA: Diagnosis not present

## 2018-10-01 DIAGNOSIS — H524 Presbyopia: Secondary | ICD-10-CM | POA: Diagnosis not present

## 2018-10-01 LAB — CBC
HCT: 39.5 % (ref 39.0–52.0)
Hemoglobin: 13.2 g/dL (ref 13.0–17.0)
MCH: 31.5 pg (ref 26.0–34.0)
MCHC: 33.4 g/dL (ref 30.0–36.0)
MCV: 94.3 fL (ref 80.0–100.0)
Platelets: 199 10*3/uL (ref 150–400)
RBC: 4.19 MIL/uL — ABNORMAL LOW (ref 4.22–5.81)
RDW: 12.8 % (ref 11.5–15.5)
WBC: 9.3 10*3/uL (ref 4.0–10.5)
nRBC: 0 % (ref 0.0–0.2)

## 2018-10-01 LAB — OCCULT BLOOD X 1 CARD TO LAB, STOOL: Fecal Occult Bld: POSITIVE — AB

## 2018-10-01 LAB — DIFFERENTIAL
Abs Immature Granulocytes: 0.02 10*3/uL (ref 0.00–0.07)
Basophils Absolute: 0.1 10*3/uL (ref 0.0–0.1)
Basophils Relative: 1 %
Eosinophils Absolute: 0.2 10*3/uL (ref 0.0–0.5)
Eosinophils Relative: 2 %
Immature Granulocytes: 0 %
Lymphocytes Relative: 25 %
Lymphs Abs: 2.3 10*3/uL (ref 0.7–4.0)
Monocytes Absolute: 0.7 10*3/uL (ref 0.1–1.0)
Monocytes Relative: 8 %
Neutro Abs: 6 10*3/uL (ref 1.7–7.7)
Neutrophils Relative %: 64 %

## 2018-10-01 LAB — COMPREHENSIVE METABOLIC PANEL
ALT: 24 U/L (ref 0–44)
AST: 21 U/L (ref 15–41)
Albumin: 3.9 g/dL (ref 3.5–5.0)
Alkaline Phosphatase: 56 U/L (ref 38–126)
Anion gap: 9 (ref 5–15)
BUN: 25 mg/dL — ABNORMAL HIGH (ref 8–23)
CO2: 23 mmol/L (ref 22–32)
Calcium: 9.1 mg/dL (ref 8.9–10.3)
Chloride: 105 mmol/L (ref 98–111)
Creatinine, Ser: 1.55 mg/dL — ABNORMAL HIGH (ref 0.61–1.24)
GFR calc Af Amer: 46 mL/min — ABNORMAL LOW (ref >60)
GFR calc non Af Amer: 40 mL/min — ABNORMAL LOW (ref >60)
Glucose, Bld: 216 mg/dL — ABNORMAL HIGH (ref 70–99)
Potassium: 4.2 mmol/L (ref 3.5–5.1)
Sodium: 137 mmol/L (ref 135–145)
Total Bilirubin: 0.5 mg/dL (ref 0.3–1.2)
Total Protein: 7.2 g/dL (ref 6.5–8.1)

## 2018-10-01 LAB — PROTIME-INR
INR: 1.9 — ABNORMAL HIGH (ref 0.8–1.2)
Prothrombin Time: 21.7 seconds — ABNORMAL HIGH (ref 11.4–15.2)

## 2018-10-01 LAB — HM DIABETES EYE EXAM

## 2018-10-01 MED ORDER — PANTOPRAZOLE SODIUM 20 MG PO TBEC: 40.0000 mg | DELAYED_RELEASE_TABLET | Freq: Every day | ORAL | 0 refills | Status: DC

## 2018-10-01 NOTE — ED Notes (Signed)
Reports dark stools for 3 days   Black tarry 6 days ago ... 1 black tarry stool a day.  Pain in his back he reports and in the last 3 days he has had no stools.  Pt. Joseph Malone he has been taking tylenol pm  Pt. Reports feeling light headed and dizzy and shortness of breath that is not anything new because he always has shortness of breath.  Pt. Reports he has always had blood in urine for as long as he can remember.

## 2018-10-01 NOTE — ED Triage Notes (Signed)
Dark stools for 3 days last week. He had his blood checked due to Coumadin use. Hypotension and weakness.

## 2018-10-01 NOTE — Telephone Encounter (Signed)
Pt scheduled to see Ellouise Newer PA 10/12/18@3 :30pm. Appt letter mailed to pt and he is aware.

## 2018-10-01 NOTE — ED Notes (Signed)
ED Provider at bedside. 

## 2018-10-01 NOTE — Telephone Encounter (Signed)
-----   Message from Irene Shipper, MD sent at 10/01/2018  3:47 PM EDT ----- Regarding: needs appointment any GI Got [facility]. [facility] patient. Transient dark stool. On coumadin. Normal HG. Heme +. EDP request GI follow up. Arrange with APP or any GI. Thanks

## 2018-10-01 NOTE — ED Provider Notes (Signed)
[facility] EMERGENCY DEPARTMENT Provider Note   CSN: [id number] Arrival date & time: 10/01/18  1400     History   Chief Complaint Chief Complaint  Patient presents with  . GI Bleeding    HPI Joseph Malone is a 85 y.o. male.     HPI   85yo male with history of CAD, htn, hlpd, ischemic cardiomyopathy, ICD, on coumadin per Dr. Haroldine Laws recommendations, who presents with concern for black stool.  Reports 6 days ago, he developed black tarry stool that lasted 3 days.  Reports he had approximately 1 black tarry stool per day for approximately 3 days.  For the last 3 days, stool have appeared normal.  Reports he has had some lightheadedness when he goes from sitting to standing which improves when he begins to move.  Reports chronic dyspnea without any acute changes.  No chest pain.  Denies any abdominal pain, nausea, vomiting, fevers, diarrhea.  Reports occasional hematuria as well, but no acute concerns regarding this.  His INR was recently 3.1 and he was told to hold a dose of the medication.  He has no history of gastrointestinal bleed.  Does have a history of diverticulosis, [facility] in [facility] past.  Denies any NSAID use.  Reports he was seeing a PA with his primary care physician's office this morning, who recommended he come to the emergency department due to concerns regarding his blood pressure and symptoms.   Past Medical History:  Diagnosis Date  . BPH (benign prostatic hypertrophy)   . CAD (coronary artery disease)    s/p CABG  . Diverticulosis   . Diverticulosis of colon without hemorrhage 10/22/2007   Qualifier: Diagnosis of  By: Linna Darner MD, [name]    . Excess weight   . Fasting hyperglycemia   . Heart attack ([place]) 2001  . Heart failure    CHF due to ischemic CM  . Hyperlipidemia   . Hypertension   . Ischemic cardiomyopathy   . Microscopic hematuria    [facility]    Patient Active Problem List   Diagnosis  Date Noted  . Long term (current) use of anticoagulants 07/08/2017  . Nephrolithiasis 03/24/2016  . Erectile dysfunction 01/19/2014  . Basal cell cancer 09/29/2012  . Solitary pulmonary nodule 07/12/2012  . Chronic systolic heart failure ([place]) 12/09/2010  . Cardiomyopathy, ischemic 10/02/2009  . Automatic implantable cardioverter-defibrillator in situ 01/16/2009  . DM (diabetes mellitus) type II, controlled, with peripheral vascular disorder ([place]) 09/14/2008  . BPH without obstruction/lower urinary tract symptoms 10/22/2007  . Hyperlipidemia associated with type 2 diabetes mellitus ([place]) 04/22/2007  . Hypertension associated with diabetes ([place]) 09/25/2006  . CAD in native artery 09/25/2006  . Osteoarthritis 09/25/2006  . Hematuria 09/25/2006    Past Surgical History:  Procedure Laterality Date  . APPENDECTOMY    . CARDIAC CATHETERIZATION  2000  . CARDIAC DEFIBRILLATOR PLACEMENT  2005   [employer]; s/p ICD gen change and RV lead revision April 2015  . CATARACT EXTRACTION  2008   bilateral with lens implant  . COLONOSCOPY  2004   Tics; [facility]  . CORONARY ARTERY BYPASS GRAFT  2000   1 vessel  . CYSTOSCOPY  06/2012   Dr [name]  . IMPLANTABLE CARDIOVERTER DEFIBRILLATOR (ICD) GENERATOR CHANGE N/A 06/01/2013   Procedure: ICD GENERATOR CHANGE;  Surgeon: [name], MD;  Location: [facility] CATH LAB;  Service: Cardiovascular;  Laterality: N/A;  . LEAD REVISION N/A 06/01/2013   Procedure:  LEAD REVISION;  Surgeon: [name], MD;  Location: [facility] CATH LAB;  Service: Cardiovascular;  Laterality: N/A;  . PILONIDAL CYST EXCISION    . RIGHT/LEFT HEART CATH AND CORONARY/GRAFT ANGIOGRAPHY N/A 06/12/2017   Procedure: RIGHT/LEFT HEART CATH AND CORONARY/GRAFT ANGIOGRAPHY;  Surgeon: [name], MD;  Location: [facility] CV LAB;  Service: Cardiovascular;  Laterality: N/A;  . TRANSTHORACIC ECHOCARDIOGRAM  08/29/2005        Home Medications    Prior to Admission medications    Medication Sig Start Date End Date Taking? Authorizing Provider  acetaminophen (TYLENOL) 500 MG tablet Take 500 mg by mouth at bedtime.     [provider]  aspirin 81 MG tablet Take 1 tablet (81 mg total) by mouth every evening. 12/28/13   Bensimhon, Shaune Pascal, MD  carvedilol (COREG) 25 MG tablet TAKE ONE-HALF TABLET BY MOUTH TWICE DAILY 06/21/18   Bensimhon, Shaune Pascal, MD  Cholecalciferol (VITAMIN D3) 2000 UNITS TABS Take 2,000 Units by mouth daily.     [provider]  folic acid (FOLVITE) A999333 MCG tablet Take 400 mcg by mouth daily.      [provider]  furosemide (LASIX) 40 MG tablet Take 1 tablet (40 mg total) by mouth daily. 03/19/15   Bensimhon, Shaune Pascal, MD  Glucosamine HCl 1500 MG TABS Take 1,500 mg by mouth every evening.     [provider]  losartan (COZAAR) 50 MG tablet TAKE 1 TABLET BY MOUTH DAILY. GENERIC EQUIVALENT FOR COZAAR 06/21/18   Bensimhon, Shaune Pascal, MD  Multiple Vitamin (MULTIVITAMIN WITH MINERALS) TABS Take 1 tablet by mouth daily.     [provider]  pantoprazole (PROTONIX) 20 MG tablet Take 2 tablets (40 mg total) by mouth daily for 10 days. 10/01/18 10/11/18  Gareth Morgan, MD  potassium chloride SA (K-DUR) 20 MEQ tablet TAKE 1 TABLET BY MOUTH DAILY, GENERIC EQUIVALENT FOR KLOR-CON. 09/27/18   Larey Dresser, MD  simvastatin (ZOCOR) 40 MG tablet TAKE ONE-HALF TABLET BY MOUTH AT BEDTIME 10/28/17   Larey Dresser, MD  spironolactone (ALDACTONE) 25 MG tablet TAKE 1/2 TABLET(12.5 MG) BY MOUTH DAILY 01/27/17   Larey Dresser, MD  tiZANidine (ZANAFLEX) 2 MG tablet Take 1 tablet (2 mg total) by mouth daily as needed for muscle spasms. 08/23/18   Marin Olp, MD  warfarin (COUMADIN) 5 MG tablet Take 1/2 tablet daily except take 1 tablet on Sunday and Tuesday or Take as directed by anticoagulation clinic  90 day 07/15/18   Bensimhon, Shaune Pascal, MD    Family History Family History  Problem Relation Age of Onset  . Hypertension  Father   . Heart attack Father 7  . Diabetes Brother        X 2  . Heart attack Brother 63  . Heart attack Paternal Uncle 58  . Sudden death Paternal Grandfather 62       ? heat stroke  . Lung cancer Sister        "Asian lung cancer"  . Stroke Neg Hx     Social History Social History   Tobacco Use  . Smoking status: Passive Smoke Exposure - Never Smoker  . Smokeless tobacco: Never Used  Substance Use Topics  . Alcohol use: No  . Drug use: No     Allergies   Penicillins   Review of Systems Review of Systems  Constitutional: Negative for fever.  HENT: Negative for sore throat.   Eyes: Negative for visual disturbance.  Respiratory: Negative for shortness of breath.   Cardiovascular: Negative for chest pain.  Gastrointestinal: Positive for blood in stool. Negative for abdominal pain.  Genitourinary: Negative for difficulty urinating.  Musculoskeletal: Negative for back pain and neck stiffness.  Skin: Negative for rash.  Neurological: Positive for light-headedness (when going from sitting to standing). Negative for syncope and headaches.     Physical Exam Updated Vital Signs BP 114/77 (BP Location: Left Arm)   Pulse 79   Temp 99 F (37.2 C) (Oral)   Resp (!) 22   Ht 5\' 11"  (1.803 m)   Wt 108.9 kg   SpO2 96%   BMI 33.47 kg/m   Physical Exam Vitals signs and nursing note reviewed.  Constitutional:      General: He is not in acute distress.    Appearance: He is well-developed. He is not diaphoretic.  HENT:     Head: Normocephalic and atraumatic.  Eyes:     Conjunctiva/sclera: Conjunctivae normal.  Neck:     Musculoskeletal: Normal range of motion.  Cardiovascular:     Rate and Rhythm: Normal rate and regular rhythm.  Pulmonary:     Effort: Pulmonary effort is normal. No respiratory distress.  Abdominal:     General: There is no distension.     Palpations: Abdomen is soft.     Tenderness: There is no abdominal tenderness. There is no guarding.   Genitourinary:    Comments: Light brown stool Skin:    General: Skin is warm and dry.  Neurological:     Mental Status: He is alert and oriented to person, place, and time.      ED Treatments / Results  Labs (all labs ordered are listed, but only abnormal results are displayed) Labs Reviewed  COMPREHENSIVE METABOLIC PANEL - Abnormal; Notable for the following components:      Result Value   Glucose, Bld 216 (*)    BUN 25 (*)    Creatinine, Ser 1.55 (*)    GFR calc non Af Amer 40 (*)    GFR calc Af Amer 46 (*)    All other components within normal limits  CBC - Abnormal; Notable for the following components:   RBC 4.19 (*)    All other components within normal limits  PROTIME-INR - Abnormal; Notable for the following components:   Prothrombin Time 21.7 (*)    INR 1.9 (*)    All other components within normal limits  OCCULT BLOOD X 1 CARD TO LAB, STOOL - Abnormal; Notable for the following components:   Fecal Occult Bld POSITIVE (*)    All other components within normal limits  DIFFERENTIAL  POC OCCULT BLOOD, ED    EKG None  Radiology No results found.  Procedures Procedures (including critical care time)  Medications Ordered in ED Medications - No data to display   Initial Impression / Assessment and Plan / ED Course  I have reviewed the triage vital signs and the nursing notes.  Pertinent labs & imaging results that were available during my care of the patient were reviewed by me and considered in my medical decision making (see chart for details).       85yo male with history of CAD, htn, hlpd, ischemic cardiomyopathy, ICD, on coumadin per Dr. Haroldine Laws recommendations, who presents with concern for black stool.  His blood pressures in the emergency department are normal without receiving intervention, he is well appearing. Stool is light brown on exam although still testing positive for blood.  Hgb  is 13 and INR is 1.9.  Suspect he did have upper GI bleed  that has resolved, and given stability and normal hemoglobin with normal stool color I feel he is stable for outpatient follow up. Discussed with Dr. Henrene Pastor who is contacting the [facility] office for follow up appointment. Discussed reasons to return with patient in detail and recommend starting protonix today. Patient discharged in stable condition with understanding of reasons to return.   Final Clinical Impressions(s) / ED Diagnoses   Final diagnoses:  Melena  Resolved.   ED Discharge Orders         Ordered    pantoprazole (PROTONIX) 20 MG tablet  Daily     10/01/18 1541           Gareth Morgan, MD 10/01/18 1554

## 2018-10-01 NOTE — Patient Instructions (Signed)
Unfortunately, due to your symptoms, you need to go to the emergency room for further evaluation and treatment.  Please proceed to [facility] or [facility] at this time.

## 2018-10-06 ENCOUNTER — Encounter: Payer: Self-pay | Admitting: Family Medicine

## 2018-10-07 ENCOUNTER — Encounter: Payer: Self-pay | Admitting: Cardiology

## 2018-10-07 NOTE — Progress Notes (Signed)
Remote ICD transmission.   

## 2018-10-08 DIAGNOSIS — R310 Gross hematuria: Secondary | ICD-10-CM | POA: Diagnosis not present

## 2018-10-12 ENCOUNTER — Encounter: Payer: Self-pay | Admitting: Physician Assistant

## 2018-10-12 ENCOUNTER — Ambulatory Visit (INDEPENDENT_AMBULATORY_CARE_PROVIDER_SITE_OTHER): Payer: Medicare Other | Admitting: Physician Assistant

## 2018-10-12 ENCOUNTER — Other Ambulatory Visit: Payer: Self-pay

## 2018-10-12 VITALS — BP 120/70 | HR 64 | Temp 98.3°F | Ht 71.0 in | Wt 244.8 lb

## 2018-10-12 DIAGNOSIS — K921 Melena: Secondary | ICD-10-CM

## 2018-10-12 MED ORDER — PANTOPRAZOLE SODIUM 20 MG PO TBEC: 20.0000 mg | DELAYED_RELEASE_TABLET | Freq: Two times a day (BID) | ORAL | 3 refills | Status: DC

## 2018-10-12 NOTE — H&P (View-Only) (Signed)
Chief Complaint: Melena, Abdominal pain  HPI:    Joseph Malone is an 85 year old Caucasian male with a past medical history as listed below including CAD and heart attack on Coumadin (06/03/2017 EF 30-35%), previously known to Dr. Deatra Ina, who was referred to me by Marin Olp, MD for a complaint of melena and abdominal pain.      10/01/2018 patient seen in the ER for melena.  Reported that for the past 3 days he had black tarry stool.  At that time, per their exam patient had light brown stool but it was Hemoccult positive.  CBC and CMP were normal other than a creatinine of 1.55.  INR was 1.9.  At that time, patient told to follow-up in the GI office and was started on Protonix.    Today, the patient tells me that this is not the first time that he has had melena.  In fact, he has had some dark tarry stools off and on over the past year or so but they seem to come and go within a day.  This last episode lasted 3 days and concerned the patient so he went to the ER.  He denies any syncope.  Does tell me that he has some abdominal pain but this is typically across his lower abdomen and comes and goes throughout the day.  Denies heartburn or reflux, uses occasional ibuprofen for headaches but not on a daily basis.    Per patient also recent episodes of hematuria for which he has seen urology.  They are planning for a bladder scan within the next few days per patient.    Denies fever, chills, weight loss, anorexia, nausea or vomiting.  Past Medical History:  Diagnosis Date  . BPH (benign prostatic hypertrophy)   . CAD (coronary artery disease)    s/p CABG  . Diverticulosis   . Diverticulosis of colon without hemorrhage 10/22/2007   Qualifier: Diagnosis of  By: Linna Darner MD, [name]    . Excess weight   . Fasting hyperglycemia   . Heart attack ([place]) 2001  . Heart failure    CHF due to ischemic CM  . Hyperlipidemia   . Hypertension   . Ischemic cardiomyopathy   . Microscopic hematuria    [facility]    Past Surgical History:  Procedure Laterality Date  . APPENDECTOMY    . CARDIAC CATHETERIZATION  2000  . CARDIAC DEFIBRILLATOR PLACEMENT  2005   [employer]; s/p ICD gen change and RV lead revision April 2015  . CATARACT EXTRACTION  2008   bilateral with lens implant  . COLONOSCOPY  2004   Tics; [facility]  . CORONARY ARTERY BYPASS GRAFT  2000   1 vessel  . CYSTOSCOPY  06/2012   Dr [name]  . IMPLANTABLE CARDIOVERTER DEFIBRILLATOR (ICD) GENERATOR CHANGE N/A 06/01/2013   Procedure: ICD GENERATOR CHANGE;  Surgeon: [name], MD;  Location: [facility] CATH LAB;  Service: Cardiovascular;  Laterality: N/A;  . LEAD REVISION N/A 06/01/2013   Procedure: LEAD REVISION;  Surgeon: [name], MD;  Location: [facility] CATH LAB;  Service: Cardiovascular;  Laterality: N/A;  . PILONIDAL CYST EXCISION    . RIGHT/LEFT HEART CATH AND CORONARY/GRAFT ANGIOGRAPHY N/A 06/12/2017   Procedure: RIGHT/LEFT HEART CATH AND CORONARY/GRAFT ANGIOGRAPHY;  Surgeon: [name], MD;  Location: [facility] LAB;  Service: Cardiovascular;  Laterality: N/A;  . TRANSTHORACIC ECHOCARDIOGRAM  08/29/2005    Current Outpatient Medications  Medication Sig Dispense Refill  . acetaminophen (TYLENOL) 500  MG tablet Take 500 mg by mouth at bedtime.     Marland Kitchen aspirin 81 MG tablet Take 1 tablet (81 mg total) by mouth every evening.    . carvedilol (COREG) 25 MG tablet TAKE ONE-HALF TABLET BY MOUTH TWICE DAILY 90 tablet 1  . Cholecalciferol (VITAMIN D3) 2000 UNITS TABS Take 2,000 Units by mouth daily.     . folic acid (FOLVITE) A999333 MCG tablet Take 400 mcg by mouth daily.      . furosemide (LASIX) 40 MG tablet Take 1 tablet (40 mg total) by mouth daily. 90 tablet 3  . Glucosamine HCl 1500 MG TABS Take 1,500 mg by mouth every evening.     Marland Kitchen losartan (COZAAR) 50 MG tablet TAKE 1 TABLET BY MOUTH DAILY. GENERIC EQUIVALENT FOR COZAAR 90 tablet 3  . Multiple Vitamin (MULTIVITAMIN WITH MINERALS) TABS Take 1 tablet by mouth  daily.     . potassium chloride SA (K-DUR) 20 MEQ tablet TAKE 1 TABLET BY MOUTH DAILY, GENERIC EQUIVALENT FOR KLOR-CON. 90 tablet 2  . simvastatin (ZOCOR) 40 MG tablet TAKE ONE-HALF TABLET BY MOUTH AT BEDTIME 45 tablet 3  . spironolactone (ALDACTONE) 25 MG tablet TAKE 1/2 TABLET(12.5 MG) BY MOUTH DAILY 45 tablet 3  . tiZANidine (ZANAFLEX) 2 MG tablet Take 1 tablet (2 mg total) by mouth daily as needed for muscle spasms. 90 tablet 0  . warfarin (COUMADIN) 5 MG tablet Take 1/2 tablet daily except take 1 tablet on Sunday and Tuesday or Take as directed by anticoagulation clinic  90 day 90 tablet 3  . pantoprazole (PROTONIX) 20 MG tablet Take 2 tablets (40 mg total) by mouth daily for 10 days. 20 tablet 0   No current facility-administered medications for this visit.     Allergies as of 10/12/2018 - Review Complete 10/12/2018  Allergen Reaction Noted  . Penicillins Rash and Other (See Comments)     Family History  Problem Relation Age of Onset  . Hypertension Father   . Heart attack Father 81  . Diabetes Brother        X 2  . Heart attack Brother 27  . Heart attack Paternal Uncle 29  . Sudden death Paternal Grandfather 7       ? heat stroke  . Lung cancer Sister        "Asian lung cancer"  . Stroke Neg Hx     Social History   Socioeconomic History  . Marital status: Married    Spouse name: Not on file  . Number of children: Not on file  . Years of education: Not on file  . Highest education level: Not on file  Occupational History  . Not on file  Social Needs  . Financial resource strain: Not on file  . Food insecurity    Worry: Not on file    Inability: Not on file  . Transportation needs    Medical: Not on file    Non-medical: Not on file  Tobacco Use  . Smoking status: Passive Smoke Exposure - Never Smoker  . Smokeless tobacco: Never Used  Substance and Sexual Activity  . Alcohol use: No  . Drug use: No  . Sexual activity: Never  Lifestyle  . Physical activity     Days per week: Not on file    Minutes per session: Not on file  . Stress: Not on file  Relationships  . Social Herbalist on phone: Not on file    Gets together:  Not on file    Attends religious service: Not on file    Active member of club or organization: Not on file    Attends meetings of clubs or organizations: Not on file    Relationship status: Not on file  . Intimate partner violence    Fear of current or ex partner: Not on file    Emotionally abused: Not on file    Physically abused: Not on file    Forced sexual activity: Not on file  Other Topics Concern  . Not on file  Social History Narrative   Married 1957. 4 children 2 boys 2 girls. 15 grandkids.  4 greatgrandkids.       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       Hobbies: genealogy, travel      No HCPOA-advised to do this.     Review of Systems:    Constitutional: No weight loss, fever or chills Skin: No rash Cardiovascular: No chest pain Respiratory: No SOB  Gastrointestinal: See HPI and otherwise negative Genitourinary: +hematuria Neurological: No headache, dizziness or syncope Musculoskeletal: No new muscle or joint pain Hematologic: No bruising Psychiatric: No history of depression or anxiety   Physical Exam:  Vital signs: BP 120/70   Pulse 64   Temp 98.3 F (36.8 C)   Ht 5\' 11"  (1.803 m)   Wt 244 lb 12.8 oz (111 kg)   BMI 34.14 kg/m   Constitutional:   Pleasant elderly Caucasian male appears to be in NAD, Well developed, Well nourished, alert and cooperative Head:  Normocephalic and atraumatic. Eyes:   PEERL, EOMI. No icterus. Conjunctiva pink. Ears:  Normal auditory acuity. Neck:  Supple Throat: Oral cavity and pharynx without inflammation, swelling or lesion.  Respiratory: Respirations even and unlabored. Lungs clear to auscultation bilaterally.   No wheezes, crackles, or rhonchi.  Cardiovascular: Normal S1, S2. No MRG. Regular  rate and rhythm. No peripheral edema, cyanosis or pallor.  Gastrointestinal:  Soft, nondistended, nontender. No rebound or guarding. Normal bowel sounds. No appreciable masses or hepatomegaly. Rectal:  Not performed.  Msk:  Symmetrical without gross deformities. Without edema, no deformity or joint abnormality.  Neurologic:  Alert and  oriented x4;  grossly normal neurologically.  Skin:   Dry and intact without significant lesions or rashes. Psychiatric: Demonstrates good judgement and reason without abnormal affect or behaviors.  RELEVANT LABS AND IMAGING: CBC    Component Value Date/Time   WBC 9.3 10/01/2018 1426   RBC 4.19 (L) 10/01/2018 1426   HGB 13.2 10/01/2018 1426   HCT 39.5 10/01/2018 1426   PLT 199 10/01/2018 1426   MCV 94.3 10/01/2018 1426   MCH 31.5 10/01/2018 1426   MCHC 33.4 10/01/2018 1426   RDW 12.8 10/01/2018 1426   LYMPHSABS 2.3 10/01/2018 1426   MONOABS 0.7 10/01/2018 1426   EOSABS 0.2 10/01/2018 1426   BASOSABS 0.1 10/01/2018 1426    CMP     Component Value Date/Time   NA 137 10/01/2018 1426   K 4.2 10/01/2018 1426   CL 105 10/01/2018 1426   CO2 23 10/01/2018 1426   GLUCOSE 216 (H) 10/01/2018 1426   BUN 25 (H) 10/01/2018 1426   CREATININE 1.55 (H) 10/01/2018 1426   CALCIUM 9.1 10/01/2018 1426   PROT 7.2 10/01/2018 1426   ALBUMIN 3.9 10/01/2018 1426   AST 21 10/01/2018 1426   ALT 24 10/01/2018 1426   ALKPHOS 56 10/01/2018 1426   BILITOT 0.5 10/01/2018  1426   GFRNONAA 40 (L) 10/01/2018 1426   GFRAA 46 (L) 10/01/2018 1426    Assessment: 1.  Melena: Off-and-on over the past year per patient, recently for 3 days in a row around 10/01/2018, hemoccult positive in ER, CBC normal; concern for upper GI source for bleeding 2.  Chronic anticoagulation: With Coumadin for "weakness" per the patient, though he does have a history of congestive heart failure and heart attack  Plan: 1.  Discussed with patient that due to the fact that he has had intermittent  melena over the past year or so, would recommend that we proceed with an EGD for further evaluation.  He was in agreement. 2.  Patient will need EGD in the hospital due to decreased EF of 30-35% last via echo on 06/03/2017.  Will discuss this with Dr. Havery Moros and it will be scheduled per her his discretion. 3.  Will discuss with Dr. Havery Moros whether or not patient needs to hold his Coumadin.  If he does we will need to contact Dr. Wonda Cerise to ensure that this is acceptable for him.  Typically hold is 7 days. 4.  Patient will continue on Pantoprazole 20 mg twice daily.  (Patient prefers twice daily dosing is once daily dosing gives him a headache) refilled this medication for the patient today. 5.  Reviewed antireflux diet and lifestyle modifications. 6.  Provided ER return precautions for the patient. 7.  Patient to follow in clinic per recommendations from Dr. Havery Moros after time of procedure.  Ellouise Newer, PA-C [facility] 10/12/2018, 3:49 PM  Cc: Marin Olp, MD

## 2018-10-12 NOTE — Progress Notes (Signed)
Chief Complaint: Melena, Abdominal pain  HPI:    Mr. Obenhaus is an 85 year old Caucasian male with a past medical history as listed below including CAD and heart attack on Coumadin (06/03/2017 EF 30-35%), previously known to Dr. Deatra Ina, who was referred to me by Marin Olp, MD for a complaint of melena and abdominal pain.      10/01/2018 patient seen in the ER for melena.  Reported that for the past 3 days he had black tarry stool.  At that time, per their exam patient had light brown stool but it was Hemoccult positive.  CBC and CMP were normal other than a creatinine of 1.55.  INR was 1.9.  At that time, patient told to follow-up in the GI office and was started on Protonix.    Today, the patient tells me that this is not the first time that he has had melena.  In fact, he has had some dark tarry stools off and on over the past year or so but they seem to come and go within a day.  This last episode lasted 3 days and concerned the patient so he went to the ER.  He denies any syncope.  Does tell me that he has some abdominal pain but this is typically across his lower abdomen and comes and goes throughout the day.  Denies heartburn or reflux, uses occasional ibuprofen for headaches but not on a daily basis.    Per patient also recent episodes of hematuria for which he has seen urology.  They are planning for a bladder scan within the next few days per patient.    Denies fever, chills, weight loss, anorexia, nausea or vomiting.  Past Medical History:  Diagnosis Date  . BPH (benign prostatic hypertrophy)   . CAD (coronary artery disease)    s/p CABG  . Diverticulosis   . Diverticulosis of colon without hemorrhage 10/22/2007   Qualifier: Diagnosis of  By: Linna Darner MD, [name]    . Excess weight   . Fasting hyperglycemia   . Heart attack ([place]) 2001  . Heart failure    CHF due to ischemic CM  . Hyperlipidemia   . Hypertension   . Ischemic cardiomyopathy   . Microscopic hematuria    [facility]    Past Surgical History:  Procedure Laterality Date  . APPENDECTOMY    . CARDIAC CATHETERIZATION  2000  . CARDIAC DEFIBRILLATOR PLACEMENT  2005   [employer]; s/p ICD gen change and RV lead revision April 2015  . CATARACT EXTRACTION  2008   bilateral with lens implant  . COLONOSCOPY  2004   Tics; [facility]  . CORONARY ARTERY BYPASS GRAFT  2000   1 vessel  . CYSTOSCOPY  06/2012   Dr [name]  . IMPLANTABLE CARDIOVERTER DEFIBRILLATOR (ICD) GENERATOR CHANGE N/A 06/01/2013   Procedure: ICD GENERATOR CHANGE;  Surgeon: [name], MD;  Location: [facility] CATH LAB;  Service: Cardiovascular;  Laterality: N/A;  . LEAD REVISION N/A 06/01/2013   Procedure: LEAD REVISION;  Surgeon: [name], MD;  Location: [facility] CATH LAB;  Service: Cardiovascular;  Laterality: N/A;  . PILONIDAL CYST EXCISION    . RIGHT/LEFT HEART CATH AND CORONARY/GRAFT ANGIOGRAPHY N/A 06/12/2017   Procedure: RIGHT/LEFT HEART CATH AND CORONARY/GRAFT ANGIOGRAPHY;  Surgeon: [name], MD;  Location: [place] CV LAB;  Service: Cardiovascular;  Laterality: N/A;  . TRANSTHORACIC ECHOCARDIOGRAM  08/29/2005    Current Outpatient Medications  Medication Sig Dispense Refill  . acetaminophen (TYLENOL) 500  MG tablet Take 500 mg by mouth at bedtime.     Marland Kitchen aspirin 81 MG tablet Take 1 tablet (81 mg total) by mouth every evening.    . carvedilol (COREG) 25 MG tablet TAKE ONE-HALF TABLET BY MOUTH TWICE DAILY 90 tablet 1  . Cholecalciferol (VITAMIN D3) 2000 UNITS TABS Take 2,000 Units by mouth daily.     . folic acid (FOLVITE) A999333 MCG tablet Take 400 mcg by mouth daily.      . furosemide (LASIX) 40 MG tablet Take 1 tablet (40 mg total) by mouth daily. 90 tablet 3  . Glucosamine HCl 1500 MG TABS Take 1,500 mg by mouth every evening.     Marland Kitchen losartan (COZAAR) 50 MG tablet TAKE 1 TABLET BY MOUTH DAILY. GENERIC EQUIVALENT FOR COZAAR 90 tablet 3  . Multiple Vitamin (MULTIVITAMIN WITH MINERALS) TABS Take 1 tablet by mouth  daily.     . potassium chloride SA (K-DUR) 20 MEQ tablet TAKE 1 TABLET BY MOUTH DAILY, GENERIC EQUIVALENT FOR KLOR-CON. 90 tablet 2  . simvastatin (ZOCOR) 40 MG tablet TAKE ONE-HALF TABLET BY MOUTH AT BEDTIME 45 tablet 3  . spironolactone (ALDACTONE) 25 MG tablet TAKE 1/2 TABLET(12.5 MG) BY MOUTH DAILY 45 tablet 3  . tiZANidine (ZANAFLEX) 2 MG tablet Take 1 tablet (2 mg total) by mouth daily as needed for muscle spasms. 90 tablet 0  . warfarin (COUMADIN) 5 MG tablet Take 1/2 tablet daily except take 1 tablet on Sunday and Tuesday or Take as directed by anticoagulation clinic  90 day 90 tablet 3  . pantoprazole (PROTONIX) 20 MG tablet Take 2 tablets (40 mg total) by mouth daily for 10 days. 20 tablet 0   No current facility-administered medications for this visit.     Allergies as of 10/12/2018 - Review Complete 10/12/2018  Allergen Reaction Noted  . Penicillins Rash and Other (See Comments)     Family History  Problem Relation Age of Onset  . Hypertension Father   . Heart attack Father 16  . Diabetes Brother        X 2  . Heart attack Brother 62  . Heart attack Paternal Uncle 50  . Sudden death Paternal Grandfather 58       ? heat stroke  . Lung cancer Sister        "Asian lung cancer"  . Stroke Neg Hx     Social History   Socioeconomic History  . Marital status: Married    Spouse name: Not on file  . Number of children: Not on file  . Years of education: Not on file  . Highest education level: Not on file  Occupational History  . Not on file  Social Needs  . Financial resource strain: Not on file  . Food insecurity    Worry: Not on file    Inability: Not on file  . Transportation needs    Medical: Not on file    Non-medical: Not on file  Tobacco Use  . Smoking status: Passive Smoke Exposure - Never Smoker  . Smokeless tobacco: Never Used  Substance and Sexual Activity  . Alcohol use: No  . Drug use: No  . Sexual activity: Never  Lifestyle  . Physical activity     Days per week: Not on file    Minutes per session: Not on file  . Stress: Not on file  Relationships  . Social Herbalist on phone: Not on file    Gets together:  Not on file    Attends religious service: Not on file    Active member of club or organization: Not on file    Attends meetings of clubs or organizations: Not on file    Relationship status: Not on file  . Intimate partner violence    Fear of current or ex partner: Not on file    Emotionally abused: Not on file    Physically abused: Not on file    Forced sexual activity: Not on file  Other Topics Concern  . Not on file  Social History Narrative   Married 1957. 4 children 2 boys 2 girls. 15 grandkids.  4 greatgrandkids.       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 Retired from [employer] and worked 30 years.       Hobbies: genealogy, travel      No HCPOA-advised to do this.     Review of Systems:    Constitutional: No weight loss, fever or chills Skin: No rash Cardiovascular: No chest pain Respiratory: No SOB  Gastrointestinal: See HPI and otherwise negative Genitourinary: +hematuria Neurological: No headache, dizziness or syncope Musculoskeletal: No new muscle or joint pain Hematologic: No bruising Psychiatric: No history of depression or anxiety   Physical Exam:  Vital signs: BP 120/70   Pulse 64   Temp 98.3 F (36.8 C)   Ht 5\' 11"  (1.803 m)   Wt 244 lb 12.8 oz (111 kg)   BMI 34.14 kg/m   Constitutional:   Pleasant elderly Caucasian male appears to be in NAD, Well developed, Well nourished, alert and cooperative Head:  Normocephalic and atraumatic. Eyes:   PEERL, EOMI. No icterus. Conjunctiva pink. Ears:  Normal auditory acuity. Neck:  Supple Throat: Oral cavity and pharynx without inflammation, swelling or lesion.  Respiratory: Respirations even and unlabored. Lungs clear to auscultation bilaterally.   No wheezes, crackles, or rhonchi.  Cardiovascular: Normal S1, S2. No MRG. Regular  rate and rhythm. No peripheral edema, cyanosis or pallor.  Gastrointestinal:  Soft, nondistended, nontender. No rebound or guarding. Normal bowel sounds. No appreciable masses or hepatomegaly. Rectal:  Not performed.  Msk:  Symmetrical without gross deformities. Without edema, no deformity or joint abnormality.  Neurologic:  Alert and  oriented x4;  grossly normal neurologically.  Skin:   Dry and intact without significant lesions or rashes. Psychiatric: Demonstrates good judgement and reason without abnormal affect or behaviors.  RELEVANT LABS AND IMAGING: CBC    Component Value Date/Time   WBC 9.3 10/01/2018 1426   RBC 4.19 (L) 10/01/2018 1426   HGB 13.2 10/01/2018 1426   HCT 39.5 10/01/2018 1426   PLT 199 10/01/2018 1426   MCV 94.3 10/01/2018 1426   MCH 31.5 10/01/2018 1426   MCHC 33.4 10/01/2018 1426   RDW 12.8 10/01/2018 1426   LYMPHSABS 2.3 10/01/2018 1426   MONOABS 0.7 10/01/2018 1426   EOSABS 0.2 10/01/2018 1426   BASOSABS 0.1 10/01/2018 1426    CMP     Component Value Date/Time   NA 137 10/01/2018 1426   K 4.2 10/01/2018 1426   CL 105 10/01/2018 1426   CO2 23 10/01/2018 1426   GLUCOSE 216 (H) 10/01/2018 1426   BUN 25 (H) 10/01/2018 1426   CREATININE 1.55 (H) 10/01/2018 1426   CALCIUM 9.1 10/01/2018 1426   PROT 7.2 10/01/2018 1426   ALBUMIN 3.9 10/01/2018 1426   AST 21 10/01/2018 1426   ALT 24 10/01/2018 1426   ALKPHOS 56 10/01/2018 1426   BILITOT 0.5 10/01/2018  1426   GFRNONAA 40 (L) 10/01/2018 1426   GFRAA 46 (L) 10/01/2018 1426    Assessment: 1.  Melena: Off-and-on over the past year per patient, recently for 3 days in a row around 10/01/2018, hemoccult positive in ER, CBC normal; concern for upper GI source for bleeding 2.  Chronic anticoagulation: With Coumadin for "weakness" per the patient, though he does have a history of congestive heart failure and heart attack  Plan: 1.  Discussed with patient that due to the fact that he has had intermittent  melena over the past year or so, would recommend that we proceed with an EGD for further evaluation.  He was in agreement. 2.  Patient will need EGD in the hospital due to decreased EF of 30-35% last via echo on 06/03/2017.  Will discuss this with Dr. Havery Moros and it will be scheduled per her his discretion. 3.  Will discuss with Dr. Havery Moros whether or not patient needs to hold his Coumadin.  If he does we will need to contact Dr. Wonda Cerise to ensure that this is acceptable for him.  Typically hold is 7 days. 4.  Patient will continue on Pantoprazole 20 mg twice daily.  (Patient prefers twice daily dosing is once daily dosing gives him a headache) refilled this medication for the patient today. 5.  Reviewed antireflux diet and lifestyle modifications. 6.  Provided ER return precautions for the patient. 7.  Patient to follow in clinic per recommendations from Dr. Havery Moros after time of procedure.  Ellouise Newer, PA-C [facility] 10/12/2018, 3:49 PM  Cc: Marin Olp, MD

## 2018-10-12 NOTE — Patient Instructions (Addendum)
We have sent the following medications to your pharmacy for you to pick up at your convenience: Pantoprazole 20 mg twice a day   We will call you to schedule your endoscopy

## 2018-10-13 ENCOUNTER — Other Ambulatory Visit: Payer: Self-pay

## 2018-10-13 ENCOUNTER — Telehealth: Payer: Self-pay

## 2018-10-13 DIAGNOSIS — R310 Gross hematuria: Secondary | ICD-10-CM | POA: Diagnosis not present

## 2018-10-13 DIAGNOSIS — K921 Melena: Secondary | ICD-10-CM

## 2018-10-13 NOTE — Telephone Encounter (Signed)
[facility] Pre-operative Risk Assessment     Request for surgical clearance:     Endoscopy Procedure  What type of surgery is being performed?     EGD  When is this surgery scheduled?     10/21/18  What type of clearance is required ?   Pharmacy  Are there any medications that need to be held prior to surgery and how long? Coumadin for 7 days  Practice name and name of physician performing surgery?      [facility]-Dr Armbruster  What is your office phone and fax number?      Phone- [phone]  [phone]  Anesthesia type (None, local, MAC, general) ?       MAC

## 2018-10-13 NOTE — Progress Notes (Signed)
Agree with assessment with the following thoughts: Agree he needs an EGD. Normal HGb is reassuring but given his intermittent symptoms and need for anticoagulation further workup is recommended. Please touch base with Dr. Hayden Pedro, if he has had recent bleeding let's ask if he can stop the Coumadin right now, and see if the patient can do an EGD at [facility] next week. I am the hospital physician next week and can hopefully accommodate him. If he's had any further bleeding symptoms, he will need a Hgb rechecked in the interim. If the EGD is negative, he may need a colonoscopy.

## 2018-10-14 ENCOUNTER — Other Ambulatory Visit: Payer: Self-pay

## 2018-10-14 ENCOUNTER — Telehealth: Payer: Self-pay

## 2018-10-14 ENCOUNTER — Ambulatory Visit (AMBULATORY_SURGERY_CENTER): Payer: Self-pay | Admitting: *Deleted

## 2018-10-14 ENCOUNTER — Telehealth (HOSPITAL_COMMUNITY): Payer: Self-pay | Admitting: Cardiology

## 2018-10-14 VITALS — Temp 98.7°F | Ht 71.0 in | Wt 243.0 lb

## 2018-10-14 DIAGNOSIS — K921 Melena: Secondary | ICD-10-CM

## 2018-10-14 MED ORDER — PANTOPRAZOLE SODIUM 40 MG PO TBEC: 40.0000 mg | DELAYED_RELEASE_TABLET | Freq: Every day | ORAL | 3 refills | Status: DC

## 2018-10-14 NOTE — Telephone Encounter (Signed)
He is cleared to proceed. Hold coumadin for 5 days

## 2018-10-14 NOTE — Telephone Encounter (Signed)
-----   Message from [place], [place] sent at 10/13/2018  4:57 PM EDT ----- Regarding: FW: Needs EGD for melena on BT Have him hold coumadin now in light of recent bleeding. Thanks-JLL ----- Message ----- From: Yetta Flock, MD Sent: 10/13/2018   3:21 PM EDT To: Levin Erp, PA Subject: RE: Needs EGD for melena on BT                 Can you please also ask if they can hold his Coumadin now in light of recent bleeding. Thanks ----- Message ----- From: Levin Erp, PA Sent: 10/13/2018   1:38 PM EDT To: Yetta Flock, MD Subject: RE: Needs EGD for melena on BT                 Thank you, will have our nurse arrange.   JLL ----- Message ----- From: Yetta Flock, MD Sent: 10/13/2018   1:32 PM EDT To: Levin Erp, PA Subject: RE: Needs EGD for melena on BT                 Farris Has,  [facility] hospital doc next week, fortunately, so I think I can do it next week at [facility]. Can do any day except try to avoid Wed, and Monday AM. Please ask Dr. Zoila Shutter if we can hold the coumadin now until his endoscopy.   Thanks  ----- Message ----- From: Levin Erp, PA Sent: 10/12/2018   4:16 PM EDT To: Yetta Flock, MD Subject: Needs EGD for melena on BT                     Does patient need to hold Coumadin? Also needs to be done in hospital due to decreased EF. When can we arrange?  Thanks- JLL

## 2018-10-14 NOTE — Telephone Encounter (Signed)
Called patient and asked him to start holding his Coumadin today (because of his recent bleeding) per Dr. Elsie Stain PA. Waiting to hear back from Dr.Bensimhon's office on the holding for EGD. Patient requesting his Protonix to be changed to 40mg  once a day. Says his Ins. will not pay for it twice a day. Please advise

## 2018-10-14 NOTE — Telephone Encounter (Addendum)
Patient with diagnosis of apical thrombus on warfarin for anticoagulation.    Procedure: EGD Date of procedure: 10/21/2018  Patient on coumadin for apical thrombus found in 2019. Was seen in the ED the end of August with rectal bleeding. Is to have an EGD preformed 10/21/2018.   MD requesting 7 day hold. Standard hold is 5 days. I will route to MD for input on holding anticoagulation.

## 2018-10-14 NOTE — Progress Notes (Signed)
No egg or soy allergy known to patient  No issues with past sedation with any surgeries  or procedures, no intubation problems  No diet pills per patient No home 02 use per patient  Coumadin stopped 10/14/2018 Wife present during Hand EMMI information given

## 2018-10-14 NOTE — Telephone Encounter (Signed)
Great thanks very much Customer service manager.

## 2018-10-14 NOTE — Telephone Encounter (Signed)
Hold for 5 days. Agree

## 2018-10-14 NOTE — Telephone Encounter (Signed)
Joseph Malone with [facility] called to request cardiac clearance for upcoming procedure, patient is scheduled for EGD on 10/21/18 and is currently on coumadin. Will the patient need to hold prior to procedure?   Please call Joseph Malone will recommendations [phone]

## 2018-10-14 NOTE — Telephone Encounter (Signed)
Patient scheduled for EGD at [facility] on 10/21/18 @10 :00am. [id number]. COVID testing scheduled for Sat. 10/16/18 at 10:30am. (since Monday is a Holiday). Patient to go to [place] Notified to go in [place] and tell tester he is having a procedure at the hospital. Patient advised he will need to quarantine from time of testing to procedure. Pre-visit scheduled 10/14/18 at 4:00pm In person.

## 2018-10-15 ENCOUNTER — Telehealth: Payer: Self-pay

## 2018-10-15 NOTE — Telephone Encounter (Signed)
Per Dr. Haroldine Laws patient may hold warfarin for 5 days prior to procedure.

## 2018-10-15 NOTE — Telephone Encounter (Signed)
Ester with [facility] aware

## 2018-10-15 NOTE — Telephone Encounter (Signed)
Received clearance from Dr. Haroldine Laws for patient to hold Coumadin for just 5 days before EGD (did not want him to hold it 7 days). Called patient and asked him to take today's dose and then hold it until after EGD is done on 10/12/18.

## 2018-10-15 NOTE — Telephone Encounter (Signed)
Okay thanks Joseph Malone 

## 2018-10-16 ENCOUNTER — Inpatient Hospital Stay (HOSPITAL_COMMUNITY): Admission: RE | Admit: 2018-10-16 | Payer: Medicare Other | Source: Ambulatory Visit

## 2018-10-19 ENCOUNTER — Other Ambulatory Visit: Payer: Self-pay

## 2018-10-19 ENCOUNTER — Encounter (HOSPITAL_COMMUNITY): Payer: Self-pay

## 2018-10-19 ENCOUNTER — Other Ambulatory Visit (HOSPITAL_COMMUNITY)
Admission: RE | Admit: 2018-10-19 | Discharge: 2018-10-19 | Disposition: A | Discharge status: Home / Self Care | Payer: Medicare Other | Source: Ambulatory Visit | Attending: Gastroenterology | Admitting: Gastroenterology

## 2018-10-19 DIAGNOSIS — Z01812 Encounter for preprocedural laboratory examination: Secondary | ICD-10-CM | POA: Diagnosis not present

## 2018-10-19 DIAGNOSIS — Z20828 Contact with and (suspected) exposure to other viral communicable diseases: Secondary | ICD-10-CM | POA: Insufficient documentation

## 2018-10-19 LAB — SARS CORONAVIRUS 2 (TAT 6-24 HRS): SARS Coronavirus 2: NEGATIVE

## 2018-10-19 NOTE — Telephone Encounter (Signed)
Please verify with patient if he has been holding coumadin, Dr. Haroldine Laws recommend hold coumadin for 5 days prior to the procedure. I will forward this note to requesting party.

## 2018-10-19 NOTE — Progress Notes (Signed)
SPOKE W/  Joseph Malone     SCREENING SYMPTOMS OF COVID 19:   COUGH--NO  RUNNY NOSE--- NO  SORE THROAT---NO  NASAL CONGESTION----NO  SNEEZING----NO  SHORTNESS OF BREATH---NO  DIFFICULTY BREATHING---NO  TEMP >100.0 -----NO  UNEXPLAINED BODY ACHES------NO  CHILLS -------- NO  HEADACHES ---------NO  LOSS OF SMELL/ TASTE --------NO    HAVE YOU OR ANY FAMILY MEMBER TRAVELLED PAST 14 DAYS OUT OF THE   COUNTY---NO STATE----NO COUNTRY----NO  HAVE YOU OR ANY FAMILY MEMBER BEEN EXPOSED TO ANYONE WITH COVID 19? NO

## 2018-10-19 NOTE — Telephone Encounter (Signed)
Left message for patient to see if he has been holding his medication since the 5th of September. Advised patient to contact the office with any questions or concerns or if he has not been holding Coumadin for 5 days.

## 2018-10-20 NOTE — Telephone Encounter (Signed)
Called patient and he verified that he has been holding his coumadin as directed. Patient had no questions and thanked me for calling.

## 2018-10-20 NOTE — Progress Notes (Signed)
Pre-op call made,confirmed patient has been quarantined since covid test, and has not felt sick or been around anyone sick. All questions answered. 

## 2018-10-21 ENCOUNTER — Other Ambulatory Visit: Payer: Self-pay

## 2018-10-21 ENCOUNTER — Ambulatory Visit (HOSPITAL_COMMUNITY): Payer: Medicare Other | Admitting: Certified Registered"

## 2018-10-21 ENCOUNTER — Encounter (HOSPITAL_COMMUNITY): Payer: Self-pay

## 2018-10-21 ENCOUNTER — Encounter (HOSPITAL_COMMUNITY)
Admission: RE | Disposition: A | Discharge status: Home / Self Care | Payer: Self-pay | Source: Home / Self Care | Attending: Gastroenterology

## 2018-10-21 ENCOUNTER — Ambulatory Visit (HOSPITAL_COMMUNITY)
Admission: RE | Admit: 2018-10-21 | Discharge: 2018-10-21 | Disposition: A | Discharge status: Home / Self Care | Payer: Medicare Other | Attending: Gastroenterology | Admitting: Gastroenterology

## 2018-10-21 DIAGNOSIS — Z79899 Other long term (current) drug therapy: Secondary | ICD-10-CM | POA: Insufficient documentation

## 2018-10-21 DIAGNOSIS — K259 Gastric ulcer, unspecified as acute or chronic, without hemorrhage or perforation: Secondary | ICD-10-CM | POA: Diagnosis not present

## 2018-10-21 DIAGNOSIS — K2951 Unspecified chronic gastritis with bleeding: Secondary | ICD-10-CM | POA: Diagnosis not present

## 2018-10-21 DIAGNOSIS — K2980 Duodenitis without bleeding: Secondary | ICD-10-CM | POA: Insufficient documentation

## 2018-10-21 DIAGNOSIS — K219 Gastro-esophageal reflux disease without esophagitis: Secondary | ICD-10-CM | POA: Diagnosis not present

## 2018-10-21 DIAGNOSIS — N400 Enlarged prostate without lower urinary tract symptoms: Secondary | ICD-10-CM | POA: Insufficient documentation

## 2018-10-21 DIAGNOSIS — Z7722 Contact with and (suspected) exposure to environmental tobacco smoke (acute) (chronic): Secondary | ICD-10-CM | POA: Diagnosis not present

## 2018-10-21 DIAGNOSIS — I739 Peripheral vascular disease, unspecified: Secondary | ICD-10-CM | POA: Insufficient documentation

## 2018-10-21 DIAGNOSIS — I5022 Chronic systolic (congestive) heart failure: Secondary | ICD-10-CM | POA: Diagnosis not present

## 2018-10-21 DIAGNOSIS — K921 Melena: Secondary | ICD-10-CM | POA: Diagnosis not present

## 2018-10-21 DIAGNOSIS — K449 Diaphragmatic hernia without obstruction or gangrene: Secondary | ICD-10-CM | POA: Diagnosis not present

## 2018-10-21 DIAGNOSIS — K3189 Other diseases of stomach and duodenum: Secondary | ICD-10-CM

## 2018-10-21 DIAGNOSIS — I110 Hypertensive heart disease with heart failure: Secondary | ICD-10-CM | POA: Insufficient documentation

## 2018-10-21 DIAGNOSIS — Z7901 Long term (current) use of anticoagulants: Secondary | ICD-10-CM | POA: Diagnosis not present

## 2018-10-21 DIAGNOSIS — K2981 Duodenitis with bleeding: Secondary | ICD-10-CM | POA: Diagnosis not present

## 2018-10-21 DIAGNOSIS — I252 Old myocardial infarction: Secondary | ICD-10-CM | POA: Insufficient documentation

## 2018-10-21 DIAGNOSIS — K2950 Unspecified chronic gastritis without bleeding: Secondary | ICD-10-CM | POA: Insufficient documentation

## 2018-10-21 DIAGNOSIS — I509 Heart failure, unspecified: Secondary | ICD-10-CM | POA: Insufficient documentation

## 2018-10-21 DIAGNOSIS — Z95810 Presence of automatic (implantable) cardiac defibrillator: Secondary | ICD-10-CM | POA: Diagnosis not present

## 2018-10-21 DIAGNOSIS — Z951 Presence of aortocoronary bypass graft: Secondary | ICD-10-CM | POA: Insufficient documentation

## 2018-10-21 DIAGNOSIS — K31819 Angiodysplasia of stomach and duodenum without bleeding: Secondary | ICD-10-CM | POA: Diagnosis not present

## 2018-10-21 DIAGNOSIS — Z7982 Long term (current) use of aspirin: Secondary | ICD-10-CM | POA: Insufficient documentation

## 2018-10-21 DIAGNOSIS — I2510 Atherosclerotic heart disease of native coronary artery without angina pectoris: Secondary | ICD-10-CM | POA: Diagnosis not present

## 2018-10-21 DIAGNOSIS — R195 Other fecal abnormalities: Secondary | ICD-10-CM | POA: Diagnosis present

## 2018-10-21 DIAGNOSIS — E785 Hyperlipidemia, unspecified: Secondary | ICD-10-CM | POA: Diagnosis not present

## 2018-10-21 HISTORY — DX: Weakness: R53.1

## 2018-10-21 HISTORY — DX: Personal history of urinary calculi: Z87.442

## 2018-10-21 HISTORY — PX: ESOPHAGOGASTRODUODENOSCOPY (EGD) WITH PROPOFOL: SHX5813

## 2018-10-21 HISTORY — DX: Other forms of dyspnea: R06.09

## 2018-10-21 HISTORY — DX: Contact with and (suspected) exposure to environmental tobacco smoke (acute) (chronic): Z77.22

## 2018-10-21 HISTORY — PX: BIOPSY: SHX5522

## 2018-10-21 HISTORY — DX: Dyspnea, unspecified: R06.00

## 2018-10-21 HISTORY — PX: HOT HEMOSTASIS: SHX5433

## 2018-10-21 SURGERY — ESOPHAGOGASTRODUODENOSCOPY (EGD) WITH PROPOFOL
Anesthesia: Monitor Anesthesia Care

## 2018-10-21 MED ORDER — PHENYLEPHRINE HCL (PRESSORS) 10 MG/ML IV SOLN
INTRAVENOUS | Status: DC | PRN
  Administered 2018-10-21: 100 ug via INTRAVENOUS
  Administered 2018-10-21: 120 ug via INTRAVENOUS

## 2018-10-21 MED ORDER — LACTATED RINGERS IV SOLN
INTRAVENOUS | Status: DC | PRN
  Administered 2018-10-21: 10:00:00 via INTRAVENOUS

## 2018-10-21 MED ORDER — PROPOFOL 500 MG/50ML IV EMUL
INTRAVENOUS | Status: DC | PRN
  Administered 2018-10-21: 20 mg via INTRAVENOUS
  Administered 2018-10-21: 30 mg via INTRAVENOUS

## 2018-10-21 MED ORDER — PROPOFOL 10 MG/ML IV BOLUS
INTRAVENOUS | Status: AC
  Filled 2018-10-21: qty 80

## 2018-10-21 MED ORDER — SODIUM CHLORIDE 0.9 % IV SOLN
INTRAVENOUS | Status: DC | PRN
  Administered 2018-10-21: 10:00:00 25 ug/min via INTRAVENOUS

## 2018-10-21 MED ORDER — LIDOCAINE HCL (CARDIAC) PF 100 MG/5ML IV SOSY
PREFILLED_SYRINGE | INTRAVENOUS | Status: DC | PRN
  Administered 2018-10-21: 50 mg via INTRATRACHEAL

## 2018-10-21 MED ORDER — SODIUM CHLORIDE 0.9 % IV SOLN: INTRAVENOUS | Status: DC

## 2018-10-21 MED ORDER — PROPOFOL 500 MG/50ML IV EMUL
INTRAVENOUS | Status: DC | PRN
  Administered 2018-10-21: 75 ug/kg/min via INTRAVENOUS

## 2018-10-21 SURGICAL SUPPLY — 15 items

## 2018-10-21 NOTE — Discharge Instructions (Signed)
YOU HAD AN ENDOSCOPIC PROCEDURE TODAY: Refer to the procedure report and other information in the discharge instructions given to you for any specific questions about what was found during the examination. If this information does not answer your questions, please call [place] office at [phone] to clarify.   YOU SHOULD EXPECT: Some feelings of bloating in the abdomen. Passage of more gas than usual. Walking can help get rid of the air that was put into your GI tract during the procedure and reduce the bloating. If you had a lower endoscopy (such as a colonoscopy or flexible sigmoidoscopy) you may notice spotting of blood in your stool or on the toilet paper. Some abdominal soreness may be present for a day or two, also.  DIET: Your first meal following the procedure should be a light meal and then it is ok to progress to your normal diet. A half-sandwich or bowl of soup is an example of a good first meal. Heavy or fried foods are harder to digest and may make you feel nauseous or bloated. Drink plenty of fluids but you should avoid alcoholic beverages for 24 hours. If you had a esophageal dilation, please see attached instructions for diet.    ACTIVITY: Your care partner should take you home directly after the procedure. You should plan to take it easy, moving slowly for the rest of the day. You can resume normal activity the day after the procedure however YOU SHOULD NOT DRIVE, use power tools, machinery or perform tasks that involve climbing or major physical exertion for 24 hours (because of the sedation medicines used during the test).   SYMPTOMS TO REPORT IMMEDIATELY: A gastroenterologist can be reached at any hour. Please call [phone]  for any of the following symptoms:   Following upper endoscopy (EGD, EUS, ERCP, esophageal dilation) Vomiting of blood or coffee ground material  New, significant abdominal pain  New, significant chest pain or pain under the shoulder blades  Painful or  persistently difficult swallowing  New shortness of breath  Black, tarry-looking or red, bloody stools  FOLLOW UP:  If any biopsies were taken you will be contacted by phone or by letter within the next 1-3 weeks. Call [phone]  if you have not heard about the biopsies in 3 weeks.  Please also call with any specific questions about appointments or follow up tests.  

## 2018-10-21 NOTE — Interval H&P Note (Signed)
History and Physical Interval Note:  10/21/2018 9:52 AM  Joseph Malone  has presented today for surgery, with the diagnosis of melena and abdominal pain.  The various methods of treatment have been discussed with the patient and family. After consideration of risks, benefits and other options for treatment, the patient has consented to  Procedure(s): ESOPHAGOGASTRODUODENOSCOPY (EGD) WITH PROPOFOL (N/A) as a surgical intervention.  The patient's history has been reviewed, patient examined, no change in status, stable for surgery.  I have reviewed the patient's chart and labs.  Questions were answered to the patient's satisfaction.     [place]

## 2018-10-21 NOTE — Transfer of Care (Signed)
Immediate Anesthesia Transfer of Care Note  Patient: Aragon L Corbitt  Procedure(s) Performed: ESOPHAGOGASTRODUODENOSCOPY (EGD) WITH PROPOFOL (N/A ) HOT HEMOSTASIS (ARGON PLASMA COAGULATION/BICAP) (N/A )  Patient Location: Endoscopy Unit  Anesthesia Type:MAC  Level of Consciousness: awake, patient cooperative and responds to stimulation  Airway & Oxygen Therapy: Patient Spontanous Breathing and Patient connected to face mask oxygen  Post-op Assessment: Report given to RN and Post -op Vital signs reviewed and stable  Post vital signs: Reviewed and stable  Last Vitals:  Vitals Value Taken Time  BP    Temp    Pulse    Resp    SpO2      Last Pain:  Vitals:   10/21/18 0903  TempSrc: Oral  PainSc: 0-No pain         Complications: No apparent anesthesia complications

## 2018-10-21 NOTE — Op Note (Addendum)
[facility] Patient Name: Joseph Malone Procedure Date: 10/21/2018 MRN: [id number] Attending MD: Carlota Raspberry. Havery Moros , MD Date of Birth: 09-Sep-1930 CSN: [id number] Age: 85 Admit Type: Outpatient Procedure:                Upper GI endoscopy Indications:              Heme positive stool, dark stool - possible melena,                            a few episodes which has since resolved, normal                            Hgb. History of coumadin use, history of Pepto                            bismol use - unclear if related to symptoms Providers:                Carlota Raspberry. Havery Moros, MD, Grace Isaac, RN,                            William Dalton, Technician Referring MD:              Medicines:                Monitored Anesthesia Care Complications:            No immediate complications. Estimated blood loss:                            Minimal. Estimated Blood Loss:     Estimated blood loss was minimal. Procedure:                Pre-Anesthesia Assessment:                           - Prior to the procedure, a History and Physical                            was performed, and patient medications and                            allergies were reviewed. The patient's tolerance of                            previous anesthesia was also reviewed. The risks                            and benefits of the procedure and the sedation                            options and risks were discussed with the patient.                            All questions were answered, and informed consent  was obtained. Prior Anticoagulants: The patient has                            taken Coumadin (warfarin), last dose was 5 days                            prior to procedure. ASA Grade Assessment: III - A                            patient with severe systemic disease. After                            reviewing the risks and benefits, the patient was   deemed in satisfactory condition to undergo the                            procedure.                           After obtaining informed consent, the endoscope was                            passed under direct vision. Throughout the                            procedure, the patient's blood pressure, pulse, and                            oxygen saturations were monitored continuously. The                            GIF-H190 ([id number]) Olympus gastroscope was                            introduced through the mouth, and advanced to the                            second part of duodenum. The upper GI endoscopy was                            accomplished without difficulty. The patient                            tolerated the procedure well. Scope In: Scope Out: Findings:      Esophagogastric landmarks were identified: the Z-line was found at 39       cm, the gastroesophageal junction was found at 39 cm and the upper       extent of the gastric folds was found at 40 cm from the incisors.      A 1 cm hiatal hernia was present.      The exam of the esophagus was otherwise normal.      A few small erosions were found in the gastric antrum with some slightly       nodular mucosa.      The exam of the stomach  was otherwise normal.      Biopsies were taken with a cold forceps in the gastric body, at the       incisura and in the gastric antrum for Helicobacter pylori testing.      A single diminutive angiodysplastic lesion without bleeding was found in       the duodenal sweep. Fulguration to ablate the lesion to prevent bleeding       by argon plasma was successful.      Localized mucosal changes were found in the duodenal sweep concerning       for possible adenomatous change vs normal variant, roughly 5-28m.       Biopsies were taken with a cold forceps for histology.      The exam of the duodenum was otherwise normal. Impression:               - Esophagogastric landmarks identified.                            - 1 cm hiatal hernia.                           - Normal esophagus                           - A few gastric erosions, no focal ulcers, slightly                            nodular mucosa in the antrum - biopsies taken to                            rule out H pylori                           - Normal stomach otherwise                           - A single non-bleeding angiodysplastic lesion in                            the duodenum. Treated with argon plasma coagulation                            (APC).                           - Mucosal changes in the duodenum - normal variant                            versus adenomatous change. Biopsied.                           Overall, unclear if these mild changes would have                            caused the patient's symptoms in the presence of  Coumadin, the AVM was very small but treated in                            case contributing. I would monitor for recurrent                            symptoms (hold Pepto Bismol). If symptoms recur,                            will need to schedule for colonoscopy Moderate Sedation:      No moderate sedation, case performed with MAC Recommendation:           - Patient has a contact number available for                            emergencies. The signs and symptoms of potential                            delayed complications were discussed with the                            patient. Return to normal activities tomorrow.                            Written discharge instructions were provided to the                            patient.                           - Resume previous diet.                           - Continue present medications. Resume Coumadin                            today                           - Increase protonix to twice daily dosing                           - Await pathology results                           - Contact us with  recurrence of symptoms, will need                            colonoscopy in that setting Procedure Code(s):        --- Professional ---                           76734, 37, Esophagogastroduodenoscopy, flexible,                            transoral; with control of bleeding, any  method                           [phone], Esophagogastroduodenoscopy, flexible,                            transoral; with biopsy, single or multiple Diagnosis Code(s):        --- Professional ---                           K44.9, Diaphragmatic hernia without obstruction or                            gangrene                           K31.89, Other diseases of stomach and duodenum                           K31.819, Angiodysplasia of stomach and duodenum                            without bleeding                           R19.5, Other fecal abnormalities                           K92.1, Melena (includes Hematochezia) CPT copyright 2019 American Medical Association. All rights reserved. The codes documented in this report are preliminary and upon coder review may  be revised to meet current compliance requirements. Remo Lipps P. Armbruster, MD 10/21/2018 10:32:03 AM This report has been signed electronically. Number of Addenda: 0

## 2018-10-21 NOTE — Anesthesia Preprocedure Evaluation (Addendum)
Anesthesia Evaluation  Patient identified by MRN, date of birth, ID band Patient awake    Reviewed: Allergy & Precautions, NPO status , Patient's Chart, lab work & pertinent test results, reviewed documented beta blocker date and time   Airway Mallampati: II  TM Distance: >3 FB Neck ROM: Full    Dental no notable dental hx.    Pulmonary neg pulmonary ROS,    Pulmonary exam normal breath sounds clear to auscultation       Cardiovascular hypertension, Pt. on home beta blockers and Pt. on medications + CAD, + Past MI, + CABG and + Peripheral Vascular Disease  Normal cardiovascular exam+ Cardiac Defibrillator  Rhythm:Regular Rate:Normal  TTE 2019 EF 30-35%, Akinesis of the apicalanteroseptal and apical myocardium, no significant valvular abnormalities  LHC 2019 Ost LAD to Prox LAD lesion is 100% stenosed. Dist Cx lesion is 20% stenosed. Mid RCA lesion is 20% stenosed. Assessment: 1. Left-dominant coronary system with chronically-occluded LAD 2. Non-obstructive CAD elsewhere 3. Patent LIMA to LAD 4. LV gram not performed due to LV thrombus 5. Normal right heart pressure with mildly reduced cardiac output   Neuro/Psych PSYCHIATRIC DISORDERS Anxiety negative neurological ROS     GI/Hepatic Neg liver ROS, GERD  ,  Endo/Other  negative endocrine ROS  Renal/GU negative Renal ROS  negative genitourinary   Musculoskeletal negative musculoskeletal ROS (+)   Abdominal   Peds  Hematology  (+) Blood dyscrasia (on coumadin), ,   Anesthesia Other Findings EGD for melena and abdominal pain  Reproductive/Obstetrics                           Anesthesia Physical Anesthesia Plan  ASA: III  Anesthesia Plan: MAC   Post-op Pain Management:    Induction: Intravenous  PONV Risk Score and Plan: 1 and Propofol infusion and Treatment may vary due to age or medical condition  Airway Management Planned:  Natural Airway  Additional Equipment:   Intra-op Plan:   Post-operative Plan:   Informed Consent: I have reviewed the patients History and Physical, chart, labs and discussed the procedure including the risks, benefits and alternatives for the proposed anesthesia with the patient or authorized representative who has indicated his/her understanding and acceptance.     Dental advisory given  Plan Discussed with: CRNA  Anesthesia Plan Comments:         Anesthesia Quick Evaluation

## 2018-10-22 NOTE — Anesthesia Postprocedure Evaluation (Signed)
Anesthesia Post Note  Patient: Vansh L Molloy  Procedure(s) Performed: ESOPHAGOGASTRODUODENOSCOPY (EGD) WITH PROPOFOL (N/A ) HOT HEMOSTASIS (ARGON PLASMA COAGULATION/BICAP) (N/A ) BIOPSY     Patient location during evaluation: Endoscopy Anesthesia Type: General Level of consciousness: awake and alert Pain management: pain level controlled Vital Signs Assessment: post-procedure vital signs reviewed and stable Respiratory status: spontaneous breathing, nonlabored ventilation, respiratory function stable and patient connected to nasal cannula oxygen Cardiovascular status: blood pressure returned to baseline and stable Postop Assessment: no apparent nausea or vomiting Anesthetic complications: no    Last Vitals:  Vitals:   10/21/18 1032 10/21/18 1040  BP: 124/68 135/82  Pulse: (!) 41 78  Resp: (!) 25 13  Temp:    SpO2: 99% 98%    Last Pain:  Vitals:   10/22/18 1118  TempSrc:   PainSc: 0-No pain                 Jaunita Mikels L Raiyan Dalesandro

## 2018-10-27 ENCOUNTER — Ambulatory Visit (INDEPENDENT_AMBULATORY_CARE_PROVIDER_SITE_OTHER): Payer: Medicare Other | Admitting: General Practice

## 2018-10-27 ENCOUNTER — Other Ambulatory Visit: Payer: Self-pay

## 2018-10-27 DIAGNOSIS — Z7901 Long term (current) use of anticoagulants: Secondary | ICD-10-CM | POA: Diagnosis not present

## 2018-10-27 LAB — POCT INR: INR: 1.6 — AB (ref 2.0–3.0)

## 2018-10-27 NOTE — Patient Instructions (Signed)
Pre visit review using our clinic review tool, if applicable. No additional management support is needed unless otherwise documented below in the visit note.  Please take 1 tablet today and 1 1/2 tablets tomorrow.  On Friday continue to take 1/2 tablet daily except 1 tablet on Sunday and Thursday.  Re-check in 3 weeks.

## 2018-10-27 NOTE — Progress Notes (Signed)
I have reviewed and agree with note, evaluation, plan.   Rojean Ige, MD  

## 2018-11-12 DIAGNOSIS — R310 Gross hematuria: Secondary | ICD-10-CM | POA: Diagnosis not present

## 2018-11-17 ENCOUNTER — Ambulatory Visit (INDEPENDENT_AMBULATORY_CARE_PROVIDER_SITE_OTHER): Payer: Medicare Other | Admitting: General Practice

## 2018-11-17 ENCOUNTER — Other Ambulatory Visit: Payer: Self-pay

## 2018-11-17 DIAGNOSIS — Z7901 Long term (current) use of anticoagulants: Secondary | ICD-10-CM | POA: Diagnosis not present

## 2018-11-17 LAB — POCT INR: INR: 3.4 — AB (ref 2.0–3.0)

## 2018-11-17 NOTE — Patient Instructions (Signed)
Pre visit review using our clinic review tool, if applicable. No additional management support is needed unless otherwise documented below in the visit note.  Hold coumadin today and then change dosage and take 1/2 tablet daily except 1 tablet on Sunday.  Re-check in 3 weeks.

## 2018-11-17 NOTE — Progress Notes (Signed)
I have reviewed and agree with note, evaluation, plan.   Stephen Hunter, MD  

## 2018-12-08 ENCOUNTER — Other Ambulatory Visit: Payer: Self-pay

## 2018-12-08 ENCOUNTER — Ambulatory Visit (INDEPENDENT_AMBULATORY_CARE_PROVIDER_SITE_OTHER): Payer: Medicare Other | Admitting: General Practice

## 2018-12-08 DIAGNOSIS — Z7901 Long term (current) use of anticoagulants: Secondary | ICD-10-CM

## 2018-12-08 LAB — POCT INR: INR: 1.5 — AB (ref 2.0–3.0)

## 2018-12-08 NOTE — Progress Notes (Signed)
I have reviewed and agree with note, evaluation, plan.   Mirel Hundal, MD  

## 2018-12-08 NOTE — Patient Instructions (Addendum)
Pre visit review using our clinic review tool, if applicable. No additional management support is needed unless otherwise documented below in the visit note.  Take 1 tablet today and tomorrow (10/28 and 10/29).  On Friday start taking 1/2 tablet daily except 1 tablet on Sunday and Thursdays.  Re-check in 2 weeks.

## 2018-12-22 ENCOUNTER — Ambulatory Visit (INDEPENDENT_AMBULATORY_CARE_PROVIDER_SITE_OTHER): Payer: Medicare Other | Admitting: General Practice

## 2018-12-22 ENCOUNTER — Other Ambulatory Visit: Payer: Self-pay

## 2018-12-22 DIAGNOSIS — Z7901 Long term (current) use of anticoagulants: Secondary | ICD-10-CM | POA: Diagnosis not present

## 2018-12-22 LAB — POCT INR: INR: 2.4 (ref 2.0–3.0)

## 2018-12-22 NOTE — Progress Notes (Signed)
I have reviewed and agree with note, evaluation, plan.   Kemara Quigley, MD  

## 2018-12-22 NOTE — Patient Instructions (Addendum)
Pre visit review using our clinic review tool, if applicable. No additional management support is needed unless otherwise documented below in the visit note.  Continue to take 1/2 tablet daily except 1 tablet on Sunday and Thursdays.  Re-check in 4 weeks. 

## 2018-12-23 ENCOUNTER — Telehealth: Payer: Self-pay | Admitting: Family Medicine

## 2018-12-23 ENCOUNTER — Other Ambulatory Visit (HOSPITAL_COMMUNITY): Payer: Self-pay

## 2018-12-23 DIAGNOSIS — I5022 Chronic systolic (congestive) heart failure: Secondary | ICD-10-CM

## 2018-12-23 MED ORDER — CARVEDILOL 25 MG PO TABS: 12.5000 mg | ORAL_TABLET | Freq: Two times a day (BID) | ORAL | 1 refills | Status: DC

## 2018-12-23 NOTE — Telephone Encounter (Signed)
Please see encounter below

## 2018-12-23 NOTE — Telephone Encounter (Signed)
Medication Refill - Medication: Freestyle [id number] - pt needs refill of test strips. He is out   Has the patient contacted their pharmacy? Yes.   (Agent: If no, request that the patient contact the pharmacy for the refill.) (Agent: If yes, when and what did the pharmacy advise?)  Preferred Pharmacy (with phone number or street name):  [facility] - [address], [place] [address]  Phone: [phone] Fax: [phone]     Agent: Please be advised that RX refills may take up to 3 business days. We ask that you follow-up with your pharmacy.

## 2018-12-23 NOTE — Telephone Encounter (Signed)
See below

## 2018-12-24 ENCOUNTER — Other Ambulatory Visit: Payer: Self-pay

## 2018-12-24 NOTE — Telephone Encounter (Signed)
Called and lm for pt tcb regarding below Rx, there are no Rxs on file for any diabetic supplies.

## 2018-12-28 NOTE — Telephone Encounter (Signed)
Left message to return call to our office.  

## 2018-12-29 ENCOUNTER — Ambulatory Visit (INDEPENDENT_AMBULATORY_CARE_PROVIDER_SITE_OTHER): Payer: Medicare Other | Admitting: *Deleted

## 2018-12-29 DIAGNOSIS — I5022 Chronic systolic (congestive) heart failure: Secondary | ICD-10-CM

## 2018-12-29 DIAGNOSIS — I255 Ischemic cardiomyopathy: Secondary | ICD-10-CM

## 2018-12-30 LAB — CUP PACEART REMOTE DEVICE CHECK
Battery Remaining Longevity: 82 mo
Battery Voltage: 3 V
Brady Statistic RV Percent Paced: 0.01 %
Date Time Interrogation Session: 20201118043624
HighPow Impedance: 75 Ohm
Implantable Lead Implant Date: 20150422
Implantable Lead Location: 753860
Implantable Lead Model: 6935
Implantable Pulse Generator Implant Date: 20150422
Lead Channel Impedance Value: 418 Ohm
Lead Channel Impedance Value: 475 Ohm
Lead Channel Pacing Threshold Amplitude: 0.5 V
Lead Channel Pacing Threshold Pulse Width: 0.4 ms
Lead Channel Sensing Intrinsic Amplitude: 13.75 mV
Lead Channel Sensing Intrinsic Amplitude: 13.75 mV
Lead Channel Setting Pacing Amplitude: 2 V
Lead Channel Setting Pacing Pulse Width: 0.4 ms
Lead Channel Setting Sensing Sensitivity: 0.3 mV

## 2019-01-19 ENCOUNTER — Other Ambulatory Visit: Payer: Self-pay

## 2019-01-19 ENCOUNTER — Ambulatory Visit (INDEPENDENT_AMBULATORY_CARE_PROVIDER_SITE_OTHER): Payer: Medicare Other | Admitting: General Practice

## 2019-01-19 DIAGNOSIS — Z7901 Long term (current) use of anticoagulants: Secondary | ICD-10-CM

## 2019-01-19 LAB — POCT INR: INR: 2.9 (ref 2.0–3.0)

## 2019-01-19 NOTE — Progress Notes (Signed)
I have reviewed and agree with note, evaluation, plan.   Edd Reppert, MD  

## 2019-01-19 NOTE — Patient Instructions (Signed)
Pre visit review using our clinic review tool, if applicable. No additional management support is needed unless otherwise documented below in the visit note.  Continue to take 1/2 tablet daily except 1 tablet on Sunday and Thursdays.  Re-check in 4 weeks. 

## 2019-01-24 ENCOUNTER — Other Ambulatory Visit: Payer: Self-pay

## 2019-01-25 ENCOUNTER — Encounter: Payer: Self-pay | Admitting: Family Medicine

## 2019-01-25 ENCOUNTER — Ambulatory Visit: Payer: Medicare Other | Admitting: Family Medicine

## 2019-01-25 ENCOUNTER — Ambulatory Visit (INDEPENDENT_AMBULATORY_CARE_PROVIDER_SITE_OTHER): Payer: Medicare Other | Admitting: Family Medicine

## 2019-01-25 VITALS — BP 110/64 | HR 88 | Temp 98.0°F | Ht 71.0 in | Wt 242.0 lb

## 2019-01-25 DIAGNOSIS — E1151 Type 2 diabetes mellitus with diabetic peripheral angiopathy without gangrene: Secondary | ICD-10-CM | POA: Diagnosis not present

## 2019-01-25 DIAGNOSIS — K921 Melena: Secondary | ICD-10-CM | POA: Diagnosis not present

## 2019-01-25 DIAGNOSIS — I255 Ischemic cardiomyopathy: Secondary | ICD-10-CM

## 2019-01-25 DIAGNOSIS — I10 Essential (primary) hypertension: Secondary | ICD-10-CM | POA: Diagnosis not present

## 2019-01-25 DIAGNOSIS — E1159 Type 2 diabetes mellitus with other circulatory complications: Secondary | ICD-10-CM

## 2019-01-25 DIAGNOSIS — I152 Hypertension secondary to endocrine disorders: Secondary | ICD-10-CM

## 2019-01-25 DIAGNOSIS — Z95810 Presence of automatic (implantable) cardiac defibrillator: Secondary | ICD-10-CM

## 2019-01-25 DIAGNOSIS — E1169 Type 2 diabetes mellitus with other specified complication: Secondary | ICD-10-CM | POA: Diagnosis not present

## 2019-01-25 DIAGNOSIS — I2510 Atherosclerotic heart disease of native coronary artery without angina pectoris: Secondary | ICD-10-CM

## 2019-01-25 DIAGNOSIS — I5022 Chronic systolic (congestive) heart failure: Secondary | ICD-10-CM

## 2019-01-25 DIAGNOSIS — E785 Hyperlipidemia, unspecified: Secondary | ICD-10-CM

## 2019-01-25 LAB — COMPREHENSIVE METABOLIC PANEL
ALT: 21 U/L (ref 0–53)
AST: 18 U/L (ref 0–37)
Albumin: 4 g/dL (ref 3.5–5.2)
Alkaline Phosphatase: 60 U/L (ref 39–117)
BUN: 19 mg/dL (ref 6–23)
CO2: 26 mEq/L (ref 19–32)
Calcium: 9.1 mg/dL (ref 8.4–10.5)
Chloride: 107 mEq/L (ref 96–112)
Creatinine, Ser: 1.2 mg/dL (ref 0.40–1.50)
GFR: 57.28 mL/min — ABNORMAL LOW (ref >60.00)
Glucose, Bld: 165 mg/dL — ABNORMAL HIGH (ref 70–99)
Potassium: 4.4 mEq/L (ref 3.5–5.1)
Sodium: 141 mEq/L (ref 135–145)
Total Bilirubin: 0.4 mg/dL (ref 0.2–1.2)
Total Protein: 6.5 g/dL (ref 6.0–8.3)

## 2019-01-25 LAB — CBC WITH DIFFERENTIAL/PLATELET
Basophils Absolute: 0.1 10*3/uL (ref 0.0–0.1)
Basophils Relative: 1.4 % (ref 0.0–3.0)
Eosinophils Absolute: 0.2 10*3/uL (ref 0.0–0.7)
Eosinophils Relative: 3.4 % (ref 0.0–5.0)
HCT: 43.7 % (ref 39.0–52.0)
Hemoglobin: 14.5 g/dL (ref 13.0–17.0)
Lymphocytes Relative: 36.1 % (ref 12.0–46.0)
Lymphs Abs: 2.6 10*3/uL (ref 0.7–4.0)
MCHC: 33.1 g/dL (ref 30.0–36.0)
MCV: 93 fl (ref 78.0–100.0)
Monocytes Absolute: 0.6 10*3/uL (ref 0.1–1.0)
Monocytes Relative: 8.7 % (ref 3.0–12.0)
Neutro Abs: 3.6 10*3/uL (ref 1.4–7.7)
Neutrophils Relative %: 50.4 % (ref 43.0–77.0)
Platelets: 170 10*3/uL (ref 150.0–400.0)
RBC: 4.7 Mil/uL (ref 4.22–5.81)
RDW: 13 % (ref 11.5–15.5)
WBC: 7.1 10*3/uL (ref 4.0–10.5)

## 2019-01-25 LAB — LIPID PANEL
Cholesterol: 154 mg/dL (ref 0–200)
HDL: 40.7 mg/dL (ref >39.00)
LDL Cholesterol: 81 mg/dL (ref 0–99)
NonHDL: 113.06
Total CHOL/HDL Ratio: 4
Triglycerides: 158 mg/dL — ABNORMAL HIGH (ref 0.0–149.0)
VLDL: 31.6 mg/dL (ref 0.0–40.0)

## 2019-01-25 LAB — IBC + FERRITIN
Ferritin: 95.7 ng/mL (ref 22.0–322.0)
Iron: 86 ug/dL (ref 42–165)
Saturation Ratios: 28.4 % (ref 20.0–50.0)
Transferrin: 216 mg/dL (ref 212.0–360.0)

## 2019-01-25 LAB — HEMOGLOBIN A1C: Hgb A1c MFr Bld: 7.4 % — ABNORMAL HIGH (ref 4.6–6.5)

## 2019-01-25 MED ORDER — BLOOD GLUCOSE MONITOR KIT: PACK | 0 refills | Status: DC

## 2019-01-25 NOTE — Progress Notes (Signed)
Phone [phone] In person visit   Subjective:   Joseph Malone is a 85 y.o. year old very pleasant male patient who presents for/with See problem oriented charting Chief Complaint  Patient presents with  . Follow-up   ROS- no increased chest pain. Slight increase SOB from last visit. More fatigue since melena. No fever or chilsl or headaches    This visit occurred during the SARS-CoV-2 public health emergency.  Safety protocols were in place, including screening questions prior to the visit, additional usage of staff PPE, and extensive cleaning of exam room while observing appropriate contact time as indicated for disinfecting solutions.   Past Medical History-  Patient Active Problem List   Diagnosis Date Noted  . Chronic systolic heart failure ([place]) 12/09/2010    Priority: High  . Cardiomyopathy, ischemic 10/02/2009    Priority: High  . Automatic implantable cardioverter-defibrillator in situ 01/16/2009    Priority: High  . DM (diabetes mellitus) type II, controlled, with peripheral vascular disorder ([place]) 09/14/2008    Priority: High  . CAD in native artery 09/25/2006    Priority: High  . Nephrolithiasis 03/24/2016    Priority: Medium  . Hyperlipidemia associated with type 2 diabetes mellitus ([place]) 04/22/2007    Priority: Medium  . Hypertension associated with diabetes ([place]) 09/25/2006    Priority: Medium  . Long term (current) use of anticoagulants 07/08/2017    Priority: Low  . Erectile dysfunction 01/19/2014    Priority: Low  . Basal cell cancer 09/29/2012    Priority: Low  . Solitary pulmonary nodule 07/12/2012    Priority: Low  . BPH without obstruction/lower urinary tract symptoms 10/22/2007    Priority: Low  . Osteoarthritis 09/25/2006    Priority: Low  . Hematuria 09/25/2006    Priority: Low  . Melena     Medications- reviewed and updated Current Outpatient Medications  Medication Sig Dispense Refill  . acetaminophen (TYLENOL) 500 MG tablet Take  500 mg by mouth at bedtime.     Marland Kitchen aspirin 81 MG tablet Take 1 tablet (81 mg total) by mouth every evening.    . carvedilol (COREG) 25 MG tablet Take 0.5 tablets (12.5 mg total) by mouth 2 (two) times daily. 90 tablet 1  . Cholecalciferol (VITAMIN D3) 2000 UNITS TABS Take 2,000 Units by mouth daily.     . folic acid (FOLVITE) 400 MCG tablet Take 400 mcg by mouth daily.      . furosemide (LASIX) 40 MG tablet Take 1 tablet (40 mg total) by mouth daily. 90 tablet 3  . Glucosamine HCl 1500 MG TABS Take 1,500 mg by mouth every evening.     Marland Kitchen losartan (COZAAR) 50 MG tablet TAKE 1 TABLET BY MOUTH DAILY. GENERIC EQUIVALENT FOR COZAAR (Patient taking differently: Take 50 mg by mouth daily. ) 90 tablet 3  . Multiple Vitamin (MULTIVITAMIN WITH MINERALS) TABS Take 1 tablet by mouth daily.     . potassium chloride SA (K-DUR) 20 MEQ tablet TAKE 1 TABLET BY MOUTH DAILY, GENERIC EQUIVALENT FOR KLOR-CON. (Patient taking differently: 20 mEq daily. ) 90 tablet 2  . simvastatin (ZOCOR) 40 MG tablet TAKE ONE-HALF TABLET BY MOUTH AT BEDTIME 45 tablet 3  . spironolactone (ALDACTONE) 25 MG tablet TAKE 1/2 TABLET(12.5 MG) BY MOUTH DAILY 45 tablet 3  . tiZANidine (ZANAFLEX) 2 MG tablet Take 1 tablet (2 mg total) by mouth daily as needed for muscle spasms. 90 tablet 0  . warfarin (COUMADIN) 5 MG tablet Take 1/2 tablet daily except  take 1 tablet on Sunday and Tuesday or Take as directed by anticoagulation clinic  90 day 90 tablet 3  . blood glucose meter kit and supplies KIT Dispense based on patient and insurance preference. Use up to four times daily as directed. 1 each 0  . pantoprazole (PROTONIX) 40 MG tablet Take 1 tablet (40 mg total) by mouth daily. (Patient not taking: Reported on 01/25/2019) 90 tablet 3   No current facility-administered medications for this visit.     Objective:  BP 110/64   Pulse 88   Temp 98 F (36.7 C) (Temporal)   Ht 5' 11"  (1.803 m)   Wt 242 lb (109.8 kg)   SpO2 95%   BMI 33.75 kg/m   Gen: NAD, resting comfortably CV: RRR no murmurs rubs or gallops Lungs: CTAB no crackles, wheeze, rhonchi Abdomen: soft/nontender/nondistended/normal bowel sounds.  Ext: no edema Skin: warm, dry    Assessment and Plan   #Melena- Dr. Havery Moros put him on omeprazole for 3 months as had some gastric erosions which may have caused issues- no bleeding since that time and no melena. We will continue to monitor. Has had some ongoing fatigue and mild SOB slightly worse from his baseline at that time.  - check CBC and iron stores  # Diabetes S: Diet controlled in the past. CBGs-  Has run over strips- highest he saw in [place] was 160 and lower end around 130.  Lab Results  Component Value Date   HGBA1C 7.0 (A) 08/23/2018   HGBA1C 6.6 (A) 11/17/2017   HGBA1C 6.6 05/12/2017  A/P: Update A1c today-if increases above 7 we discussed potentially starting Metformin 500 mg once or twice a day. -FYI does get occasional slight diarrhea so would have to watch with metformin   # Hypertension associated with diabetes ([place]) S:Compliant with carvedilol 12.5 mg twice a day, Lasix 40 mg once a day, losartan 50 mg, spironolactone 12.5 mg A/P:   Stable. Continue current medications.    #Hyperlipidemia associated with type 2 diabetes mellitus ([place]) S: Compliant with simvastatin 40 mg-only takes 1/2 tablet A/P: Last lipid panel over a year ago-update lipid panel today.  Target LDL under 70 with lifestyle changes-gets significant fatigue on higher doses of statins    # CAD/Automatic implantable cardioverter-defibrillator in situ/Cardiomyopathy, ischemic/Chronic systolic heart failure (HCC) S:Patient is compliant with carvedilol and losartan and spironolactone and lasix as above.  He also takes aspirin regularly.  He is compliant with his statin as above.   No obvious fluid overload today.  No increased chest pain or shortness of breath reported.  Continues close follow-up with cardiology-Dr. Lovena Le A/P:   CAD/cardiomyopathy/CHF- Each issue appears stable. Has follow up with Dr. Lovena Le tomorrow. Shortness of breath worsened after melena episode- we are checking blood work to make sure anemia is not contributing and to check on his iron levels.      Recommended follow up: Return in about 7 months (around 08/25/2019) for physical or sooner if needed. Future Appointments  Date Time Provider Morning Sun  01/26/2019  1:45 PM [name], MD [facility]  02/23/2019 11:00 AM [facility]  03/30/2019  8:00 AM [facility] DEVICE REMOTES [facility]  06/29/2019  8:00 AM [facility] DEVICE REMOTES [facility]  09/28/2019  8:00 AM [facility] DEVICE REMOTES [facility]  12/28/2019  8:00 AM [facility] DEVICE REMOTES [facility]    Lab/Order associations:   ICD-10-CM   1. DM (diabetes mellitus) type II, controlled, with peripheral vascular disorder (  HCC)  E11.51 CBC with Differential/Platelet    Comprehensive metabolic panel    Lipid panel    Hemoglobin A1c  2. Hypertension associated with diabetes ([place])  E11.59 CBC with Differential/Platelet   I10 Comprehensive metabolic panel    Lipid panel  3. Hyperlipidemia associated with type 2 diabetes mellitus (HCC)  E11.69 CBC with Differential/Platelet   E78.5 Comprehensive metabolic panel    Lipid panel  4. CAD in native artery  I25.10   5. Automatic implantable cardioverter-defibrillator in situ  Z95.810   6. Cardiomyopathy, ischemic  I25.5   7. Chronic systolic heart failure (HCC)  I50.22   8. Melena  K92.1 Ferritin (new as of 03/16/2018)    Meds ordered this encounter  Medications  . blood glucose meter kit and supplies KIT    Sig: Dispense based on patient and insurance preference. Use up to four times daily as directed.    Dispense:  1 each    Refill:  0    Order Specific Question:   Number of strips    Answer:   100    Order Specific Question:    Number of lancets    Answer:   100    Return precautions advised.  Garret Reddish, MD

## 2019-01-25 NOTE — Patient Instructions (Addendum)
Please stop by lab before you go If you do not have mychart- we will call you about results within 5 business days of [place] receiving them.  If you have mychart- we will send your results within 3 business days of [place] receiving them.  If abnormal or we want to clarify a result, we will call or mychart you to make sure you receive the message.  If you have questions or concerns or don't hear within 5-7 days, please send [place] a message or call us.   Recommended follow up: Return in about 7 months (around 08/25/2019) for physical or sooner if needed.

## 2019-01-26 ENCOUNTER — Other Ambulatory Visit: Payer: Self-pay

## 2019-01-26 ENCOUNTER — Encounter: Payer: Self-pay | Admitting: Internal Medicine

## 2019-01-26 ENCOUNTER — Ambulatory Visit (INDEPENDENT_AMBULATORY_CARE_PROVIDER_SITE_OTHER): Payer: Medicare Other | Admitting: Internal Medicine

## 2019-01-26 VITALS — BP 112/64 | HR 82 | Ht 71.0 in | Wt 243.0 lb

## 2019-01-26 DIAGNOSIS — I2510 Atherosclerotic heart disease of native coronary artery without angina pectoris: Secondary | ICD-10-CM | POA: Diagnosis not present

## 2019-01-26 DIAGNOSIS — Z95810 Presence of automatic (implantable) cardiac defibrillator: Secondary | ICD-10-CM

## 2019-01-26 DIAGNOSIS — I255 Ischemic cardiomyopathy: Secondary | ICD-10-CM

## 2019-01-26 LAB — CUP PACEART INCLINIC DEVICE CHECK
Battery Remaining Longevity: 80 mo
Battery Voltage: 3 V
Brady Statistic RV Percent Paced: 0.01 %
Date Time Interrogation Session: 20201216151400
HighPow Impedance: 70 Ohm
Implantable Lead Implant Date: 20150422
Implantable Lead Location: 753860
Implantable Lead Model: 6935
Implantable Pulse Generator Implant Date: 20150422
Lead Channel Impedance Value: 475 Ohm
Lead Channel Impedance Value: 532 Ohm
Lead Channel Pacing Threshold Amplitude: 0.625 V
Lead Channel Pacing Threshold Pulse Width: 0.4 ms
Lead Channel Sensing Intrinsic Amplitude: 19.875 mV
Lead Channel Setting Pacing Amplitude: 2 V
Lead Channel Setting Pacing Pulse Width: 0.4 ms
Lead Channel Setting Sensing Sensitivity: 0.3 mV

## 2019-01-26 MED ORDER — BLOOD GLUCOSE MONITOR KIT: PACK | 0 refills | Status: DC

## 2019-01-26 NOTE — Patient Instructions (Addendum)
Medication Instructions:  Your physician recommends that you continue on your current medications as directed. Please refer to the Current Medication list given to you today.  *If you need a refill on your cardiac medications before your next appointment, please call your pharmacy*  Lab Work: None ordered.  If you have labs (blood work) drawn today and your tests are completely normal, you will receive your results only by: Marland Kitchen MyChart Message (if you have MyChart) OR . A paper copy in the mail If you have any lab test that is abnormal or we need to change your treatment, we will call you to review the results.  Testing/Procedures: None ordered.   Follow-Up:Your physician wants you to follow-up in: one year You will receive a reminder letter in the mail two months in advance. If you don't receive a letter, please call our office to schedule the follow-up appointment.   Remote monitoring is used to monitor your  ICD from home. This monitoring reduces the number of office visits required to check your device to one time per year. It allows [place] to keep an eye on the functioning of your device to ensure it is working properly. You are scheduled for a device check from home on 03/30/2019 @ 8am. You may send your transmission at any time that day. If you have a wireless device, the transmission will be sent automatically. After your physician reviews your transmission, you will receive a postcard with your next transmission date.      At [facility], you and your health needs are our priority.  As part of our continuing mission to provide you with exceptional heart care, we have created designated Provider Care Teams.  These Care Teams include your primary Cardiologist (physician) and Advanced Practice Providers (APPs -  Physician Assistants and Nurse Practitioners) who all work together to provide you with the care you need, when you need it.

## 2019-01-26 NOTE — Progress Notes (Signed)
HPI Joseph Malone returns today for followup. He has a h/o CAD, s/p MI, s/p CABG, s/p ICD insertion in 2015. He has and EF of 30%. He also has diverticulosis. He has had periods of weakness but no syncope. No ICD therapies. No edema. Allergies  Allergen Reactions  . Penicillins Rash and Other (See Comments)    Because of a history of documented adverse serious drug reaction;Medi Alert bracelet  is recommended Has patient had a PCN reaction causing immediate rash, facial/tongue/throat swelling, SOB or lightheadedness with hypotension: No Has patient had a PCN reaction causing severe rash involving mucus membranes or skin necrosis: No Has patient had a PCN reaction that required hospitalization: No Has patient had a PCN reaction occurring within the last 10 years: No If all of the above answers are "NO", t     Current Outpatient Medications  Medication Sig Dispense Refill  . acetaminophen (TYLENOL) 500 MG tablet Take 500 mg by mouth at bedtime.     Marland Kitchen aspirin 81 MG tablet Take 1 tablet (81 mg total) by mouth every evening.    . blood glucose meter kit and supplies KIT Dispense based on patient and insurance preference. Use up to four times daily as directed. 1 each 0  . blood glucose meter kit and supplies KIT Dispense based on patient and insurance preference. Use up to four times daily as directed.  Dx E11.51 1 each 0  . carvedilol (COREG) 25 MG tablet Take 0.5 tablets (12.5 mg total) by mouth 2 (two) times daily. 90 tablet 1  . Cholecalciferol (VITAMIN D3) 2000 UNITS TABS Take 2,000 Units by mouth daily.     . folic acid (FOLVITE) 947 MCG tablet Take 400 mcg by mouth daily.      . furosemide (LASIX) 40 MG tablet Take 1 tablet (40 mg total) by mouth daily. 90 tablet 3  . Glucosamine HCl 1500 MG TABS Take 1,500 mg by mouth every evening.     Marland Kitchen losartan (COZAAR) 50 MG tablet TAKE 1 TABLET BY MOUTH DAILY. GENERIC EQUIVALENT FOR COZAAR (Patient taking differently: Take 50 mg by mouth  daily. ) 90 tablet 3  . Multiple Vitamin (MULTIVITAMIN WITH MINERALS) TABS Take 1 tablet by mouth daily.     . pantoprazole (PROTONIX) 40 MG tablet Take 1 tablet (40 mg total) by mouth daily. (Patient not taking: Reported on 01/25/2019) 90 tablet 3  . potassium chloride SA (K-DUR) 20 MEQ tablet TAKE 1 TABLET BY MOUTH DAILY, GENERIC EQUIVALENT FOR KLOR-CON. (Patient taking differently: 20 mEq daily. ) 90 tablet 2  . simvastatin (ZOCOR) 40 MG tablet TAKE ONE-HALF TABLET BY MOUTH AT BEDTIME 45 tablet 3  . spironolactone (ALDACTONE) 25 MG tablet TAKE 1/2 TABLET(12.5 MG) BY MOUTH DAILY 45 tablet 3  . tiZANidine (ZANAFLEX) 2 MG tablet Take 1 tablet (2 mg total) by mouth daily as needed for muscle spasms. 90 tablet 0  . warfarin (COUMADIN) 5 MG tablet Take 1/2 tablet daily except take 1 tablet on Sunday and Tuesday or Take as directed by anticoagulation clinic  90 day 90 tablet 3   No current facility-administered medications for this visit.     Past Medical History:  Diagnosis Date  . Anxiety   . BPH (benign prostatic hypertrophy)   . CAD (coronary artery disease)    s/p CABG  . Diverticulosis   . Diverticulosis of colon without hemorrhage 10/22/2007   Qualifier: Diagnosis of  By: Linna Darner MD, [name]    .  DOE (dyspnea on exertion)   . Excess weight   . Fasting hyperglycemia   . Generalized weakness   . GERD (gastroesophageal reflux disease)   . Heart attack ([place]) 2001  . Heart failure    CHF due to ischemic CM  . History of kidney stones   . Hyperlipidemia   . Hypertension   . Ischemic cardiomyopathy   . Microscopic hematuria    [facility]  . 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   Past Surgical History:  Procedure Laterality Date  . APPENDECTOMY    . BIOPSY  10/21/2018   Procedure: BIOPSY;  Surgeon: Yetta Flock, MD;  Location: [facility];  Service: Gastroenterology;;  . CARDIAC  CATHETERIZATION  2000  . CARDIAC DEFIBRILLATOR PLACEMENT  2005   Medtronic; s/p ICD gen change and RV lead revision April 2015  . CATARACT EXTRACTION  2008   bilateral with lens implant  . COLONOSCOPY  2004   Tics; [facility]  . CORONARY ARTERY BYPASS GRAFT  2000   1 vessel  . CYSTOSCOPY  06/2012   Dr [name]  . ESOPHAGOGASTRODUODENOSCOPY (EGD) WITH PROPOFOL N/A 10/21/2018   Procedure: ESOPHAGOGASTRODUODENOSCOPY (EGD) WITH PROPOFOL;  Surgeon: Yetta Flock, MD;  Location: [facility];  Service: Gastroenterology;  Laterality: N/A;  . HOT HEMOSTASIS N/A 10/21/2018   Procedure: HOT HEMOSTASIS (ARGON PLASMA COAGULATION/BICAP);  Surgeon: Yetta Flock, MD;  Location: [facility];  Service: Gastroenterology;  Laterality: N/A;  . IMPLANTABLE CARDIOVERTER DEFIBRILLATOR (ICD) GENERATOR CHANGE N/A 06/01/2013   Procedure: ICD GENERATOR CHANGE;  Surgeon: [name], MD;  Location: [facility] CATH LAB;  Service: Cardiovascular;  Laterality: N/A;  . LEAD REVISION N/A 06/01/2013   Procedure: LEAD REVISION;  Surgeon: [name], MD;  Location: [facility] CATH LAB;  Service: Cardiovascular;  Laterality: N/A;  . PILONIDAL CYST EXCISION    . RIGHT/LEFT HEART CATH AND CORONARY/GRAFT ANGIOGRAPHY N/A 06/12/2017   Procedure: RIGHT/LEFT HEART CATH AND CORONARY/GRAFT ANGIOGRAPHY;  Surgeon: [name], MD;  Location: [facility];  Service: Cardiovascular;  Laterality: N/A;  . TRANSTHORACIC ECHOCARDIOGRAM  08/29/2005     Family History  Problem Relation Age of Onset  . Hypertension Father   . Heart attack Father 41  . Diabetes Brother        X 2  . Heart attack Brother 13  . Heart attack Paternal Uncle 70  . Sudden death Paternal Grandfather 59       ? heat stroke  . Lung cancer Sister        "Asian lung cancer"  . Stroke Neg Hx   . Colon cancer Neg Hx   . Esophageal cancer Neg Hx   . Rectal cancer Neg Hx   . Stomach cancer Neg Hx      Social History   Socioeconomic History  .  Marital status: Married    Spouse name: Not on file  . Number of children: Not on file  . Years of education: Not on file  . Highest education level: Not on file  Occupational History  . Not on file  Tobacco Use  . Smoking status: Passive Smoke Exposure - Never Smoker  . Smokeless tobacco: Never Used  Substance and Sexual Activity  . Alcohol use: No  . Drug use: No  . Sexual activity: Never  Other Topics Concern  . Not on file  Social History  Narrative   Married 1957. 4 children 2 boys 2 girls. 15 grandkids.  4 greatgrandkids.       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       Hobbies: genealogy, travel      No HCPOA-advised to do this.    Social Determinants of Health   Financial Resource Strain:   . Difficulty of Paying Living Expenses: Not on file  Food Insecurity:   . Worried About Charity fundraiser in the Last Year: Not on file  . Ran Out of Food in the Last Year: Not on file  Transportation Needs:   . Lack of Transportation (Medical): Not on file  . Lack of Transportation (Non-Medical): Not on file  Physical Activity:   . Days of Exercise per Week: Not on file  . Minutes of Exercise per Session: Not on file  Stress:   . Feeling of Stress : Not on file  Social Connections:   . Frequency of Communication with Friends and Family: Not on file  . Frequency of Social Gatherings with Friends and Family: Not on file  . Attends Religious Services: Not on file  . Active Member of Clubs or Organizations: Not on file  . Attends Archivist Meetings: Not on file  . Marital Status: Not on file  Intimate Partner Violence:   . Fear of Current or Ex-Partner: Not on file  . Emotionally Abused: Not on file  . Physically Abused: Not on file  . Sexually Abused: Not on file     BP 112/64   Pulse 82   Ht _0  (1.803 m)   Wt 243 lb (110.2 kg)   SpO2 94%   BMI 33.89 kg/m   Physical Exam:  Well appearing  NAD HEENT: Unremarkable Neck:  No JVD, no thyromegally Lymphatics:  No adenopathy Back:  No CVA tenderness Lungs:  Clear HEART:  Regular rate rhythm, no murmurs, no rubs, no clicks Abd:  soft, positive bowel sounds, no organomegally, no rebound, no guarding Ext:  2 plus pulses, no edema, no cyanosis, no clubbing Skin:  No rashes no nodules Neuro:  CN II through XII intact, motor grossly intact  DEVICE  Normal device function.  See PaceArt for details.   Assess/Plan: 1. ICD - his medtronic single chamber ICD is working normally. 2. CAD - he remains sedentary but has not had anginal symptoms. He is encouraged to increase his activity. 3. Chronic systolic heart failure - his symptoms remain class 2. He will continue his current meds and maintain a low sodium diet.

## 2019-01-27 NOTE — Progress Notes (Signed)
Remote ICD transmission.   

## 2019-02-10 ENCOUNTER — Ambulatory Visit: Payer: Self-pay | Admitting: *Deleted

## 2019-02-10 ENCOUNTER — Encounter: Payer: Self-pay | Admitting: Family Medicine

## 2019-02-10 NOTE — Telephone Encounter (Signed)
This encounter was created in error - please disregard.

## 2019-02-10 NOTE — Telephone Encounter (Signed)
Message from [place] sent at 02/10/2019 11:09 AM EST  Summary: Covid 19 question- headache and body aches    Patient would like to know if he should be tested for covid-19. Patient is having body aches and headache.         Call History  Pt called with concerns of having symptoms that could be COVID symptoms. Possible exposure on 12/25, testing appt made, care instructions given, Pt advised to seek treatment at [facility] or ED for worsening symptoms.    Reason for Disposition . [1] HIGH RISK patient (e.g., age > 58 years, diabetes, heart or lung disease, weak immune system) AND [2] new or worsening symptoms  Answer Assessment - Initial Assessment Questions 1. COVID-19 DIAGNOSIS: "Who made your Coronavirus (COVID-19) diagnosis?" "Was it confirmed by a positive lab test?" If not diagnosed by a HCP, ask "Are there lots of cases (community spread) where you live?" (See public health department website, if unsure)     na 2. COVID-19 EXPOSURE: "Was there any known exposure to COVID before the symptoms began?" CDC Definition of close contact: within 6 feet (2 meters) for a total of 15 minutes or more over a 24-hour period.      Questionable grandchild at Christmas 3. ONSET: "When did the COVID-19 symptoms start?"      12/28 4. WORST SYMPTOM: "What is your worst symptom?" (e.g., cough, fever, shortness of breath, muscle aches)     headache 5. COUGH: "Do you have a cough?" If so, ask: "How bad is the cough?"       Slight cough 6. FEVER: "Do you have a fever?" If so, ask: "What is your temperature, how was it measured, and when did it start?"     Feels feverish, thinks thermometer not working correctly 7. RESPIRATORY STATUS: "Describe your breathing?" (e.g., shortness of breath, wheezing, unable to speak)      denies 8. BETTER-SAME-WORSE: "Are you getting better, staying the same or getting worse compared to yesterday?"  If getting worse, ask, "In what way?"     same 9. HIGH RISK DISEASE:  "Do you have any chronic medical problems?" (e.g., asthma, heart or lung disease, weak immune system, obesity, etc.)     CHF, HTN 10. PREGNANCY: "Is there any chance you are pregnant?" "When was your last menstrual period?"       na 11. OTHER SYMPTOMS: "Do you have any other symptoms?"  (e.g., chills, fatigue, headache, loss of smell or taste, muscle pain, sore throat; new loss of smell or taste especially support the diagnosis of COVID-19)  back pain  Protocols used: CORONAVIRUS (COVID-19) DIAGNOSED OR SUSPECTED-A-AH

## 2019-02-11 DIAGNOSIS — R350 Frequency of micturition: Secondary | ICD-10-CM | POA: Diagnosis not present

## 2019-02-11 DIAGNOSIS — R197 Diarrhea, unspecified: Secondary | ICD-10-CM | POA: Diagnosis not present

## 2019-02-11 DIAGNOSIS — M545 Low back pain: Secondary | ICD-10-CM | POA: Diagnosis not present

## 2019-02-11 DIAGNOSIS — N390 Urinary tract infection, site not specified: Secondary | ICD-10-CM | POA: Diagnosis not present

## 2019-02-14 ENCOUNTER — Ambulatory Visit: Payer: Medicare Other | Attending: Internal Medicine

## 2019-02-14 ENCOUNTER — Other Ambulatory Visit: Payer: Self-pay

## 2019-02-14 DIAGNOSIS — Z20822 Contact with and (suspected) exposure to covid-19: Secondary | ICD-10-CM | POA: Diagnosis not present

## 2019-02-15 ENCOUNTER — Telehealth: Payer: Self-pay | Admitting: *Deleted

## 2019-02-15 LAB — NOVEL CORONAVIRUS, NAA: SARS-CoV-2, NAA: DETECTED — AB

## 2019-02-15 NOTE — Telephone Encounter (Signed)
Pt had asked if he can still get COVID vaccine when available now that he has had COVID. CDC site states that if pt having no symptoms and their quarantine/isolation period is complete they may get the vaccine. Pt notified of this.

## 2019-02-17 ENCOUNTER — Encounter: Payer: Self-pay | Admitting: Family Medicine

## 2019-02-17 ENCOUNTER — Ambulatory Visit (INDEPENDENT_AMBULATORY_CARE_PROVIDER_SITE_OTHER): Payer: Medicare Other | Admitting: Family Medicine

## 2019-02-17 ENCOUNTER — Telehealth: Payer: Self-pay | Admitting: Nurse Practitioner

## 2019-02-17 VITALS — Temp 97.2°F | Ht 71.0 in | Wt 229.0 lb

## 2019-02-17 DIAGNOSIS — M545 Low back pain, unspecified: Secondary | ICD-10-CM

## 2019-02-17 DIAGNOSIS — U071 COVID-19: Secondary | ICD-10-CM | POA: Diagnosis not present

## 2019-02-17 DIAGNOSIS — R1030 Lower abdominal pain, unspecified: Secondary | ICD-10-CM

## 2019-02-17 NOTE — Progress Notes (Signed)
Phone [phone] Virtual visit via Video note   Subjective:  Chief complaint: Chief Complaint  Patient presents with  . Back Pain    with abdominal pain    This visit type was conducted due to national recommendations for restrictions regarding the COVID-19 Pandemic (e.g. social distancing).  This format is felt to be most appropriate for this patient at this time balancing risks to patient and risks to population by having him in for in person visit.  No physical exam was performed (except for noted visual exam or audio findings with Telehealth visits).    Our team/I connected with [name] at  1:20 PM EST by a video enabled telemedicine application (doxy.me or caregility through epic) and verified that I am speaking with the correct person using two identifiers.  Location patient: [place] Location provider: [facility], office Persons participating in the virtual visit:  patient  Our team/I discussed the limitations of evaluation and management by telemedicine and the availability of in person appointments. In light of current covid-19 pandemic, patient also understands that we are trying to protect them by minimizing in office contact if at all possible.  The patient expressed consent for telemedicine visit and agreed to proceed. Patient understands insurance will be billed.   ROS-complains of some low back pain bilaterally.  Has had some lower abdominal pain.  No fever chills reported.  Denies cough or congestion or fever   Past Medical History-  Patient Active Problem List   Diagnosis Date Noted  . Chronic systolic heart failure ([place]) 12/09/2010    Priority: High  . Cardiomyopathy, ischemic 10/02/2009    Priority: High  . Automatic implantable cardioverter-defibrillator in situ 01/16/2009    Priority: High  . DM (diabetes mellitus) type II, controlled, with peripheral vascular disorder ([place]) 09/14/2008    Priority: High  . CAD in native artery 09/25/2006    Priority:  High  . Nephrolithiasis 03/24/2016    Priority: Medium  . Hyperlipidemia associated with type 2 diabetes mellitus ([place]) 04/22/2007    Priority: Medium  . Hypertension associated with diabetes ([place]) 09/25/2006    Priority: Medium  . Long term (current) use of anticoagulants 07/08/2017    Priority: Low  . Erectile dysfunction 01/19/2014    Priority: Low  . Basal cell cancer 09/29/2012    Priority: Low  . Solitary pulmonary nodule 07/12/2012    Priority: Low  . BPH without obstruction/lower urinary tract symptoms 10/22/2007    Priority: Low  . Osteoarthritis 09/25/2006    Priority: Low  . Hematuria 09/25/2006    Priority: Low  . Melena     Medications- reviewed and updated Current Outpatient Medications  Medication Sig Dispense Refill  . acetaminophen (TYLENOL) 500 MG tablet Take 500 mg by mouth at bedtime.     Marland Kitchen aspirin 81 MG tablet Take 1 tablet (81 mg total) by mouth every evening.    . carvedilol (COREG) 25 MG tablet Take 0.5 tablets (12.5 mg total) by mouth 2 (two) times daily. 90 tablet 1  . Cholecalciferol (VITAMIN D3) 2000 UNITS TABS Take 2,000 Units by mouth daily.     . folic acid (FOLVITE) A999333 MCG tablet Take 400 mcg by mouth daily.      . furosemide (LASIX) 40 MG tablet Take 1 tablet (40 mg total) by mouth daily. 90 tablet 3  . Glucosamine HCl 1500 MG TABS Take 1,500 mg by mouth every evening.     Marland Kitchen losartan (COZAAR) 50 MG tablet TAKE 1 TABLET  BY MOUTH DAILY. GENERIC EQUIVALENT FOR COZAAR (Patient taking differently: Take 50 mg by mouth daily. ) 90 tablet 3  . Multiple Vitamin (MULTIVITAMIN WITH MINERALS) TABS Take 1 tablet by mouth daily.     . pantoprazole (PROTONIX) 40 MG tablet Take 1 tablet (40 mg total) by mouth daily. 90 tablet 3  . potassium chloride SA (K-DUR) 20 MEQ tablet TAKE 1 TABLET BY MOUTH DAILY, GENERIC EQUIVALENT FOR KLOR-CON. (Patient taking differently: 20 mEq daily. ) 90 tablet 2  . simvastatin (ZOCOR) 40 MG tablet TAKE ONE-HALF TABLET BY MOUTH AT  BEDTIME 45 tablet 3  . spironolactone (ALDACTONE) 25 MG tablet TAKE 1/2 TABLET(12.5 MG) BY MOUTH DAILY 45 tablet 3  . tiZANidine (ZANAFLEX) 2 MG tablet Take 1 tablet (2 mg total) by mouth daily as needed for muscle spasms. 90 tablet 0  . warfarin (COUMADIN) 5 MG tablet Take 1/2 tablet daily except take 1 tablet on Sunday and Tuesday or Take as directed by anticoagulation clinic  90 day 90 tablet 3   No current facility-administered medications for this visit.     Objective:  Temp (!) 97.2 F (36.2 C) (Oral)   Ht 5\' 11"  (1.803 m)   Wt 229 lb (103.9 kg)   BMI 31.94 kg/m  self reported vitals Gen: NAD, resting comfortably Lungs: nonlabored, normal respiratory rate  Skin: appears dry, no obvious rash     Assessment and Plan   #COVID-19/lower abdominal pain/low back pain S:Did test positive for covid on 02/07/2019 sounds like through [place] walk-in but he also has a positive test result on January 4 through [facility]- had relatives over at Christmas and one of them was positive he eventually found out.  He denies having symptoms from COVID-19 other than possible body aches as noted below.  He was told his quarantine would end on Wednesday-when I count the days I recommended extending at least through Monday/14 days since he has the ability to do so  Since late December after his Covid exposure, he started having some intermittent low back pain (right low back mainly but some on left- mild to moderate and improves with laying down but worse with walking)  and abdominal pain (in middle of lower abdomen- mild to moderate- may get better with glass of milk)  about three days ago. He has been experiencing some nocturia three or more times a night (increased from baseline). At present not having any pain in his back or stomach. Lower abdominal pain usually worse after food- had been constipated but doing better.   Patient was seen by [facility] walk in clinic (tested for covid in car and had video visit with  doctor from car)  and given script of Cipro for possible UTI with lower abdominal and back pain but he was very concerned about side effects. Patient did not start due to possible side effects.   He states today he is not having any symptoms-he would like to know what he needs to do in regards to the antibiotic A/P: Patient tested positive for COVID-19 in late December-has had some possible body aches associated with that including low back pain and lower abdominal pain.  His symptoms appear to have largely resolved at this point.  [place] walk-in clinic was going to empirically treat for potential UTI but patient never took ciprofloxacin-since symptoms have improved I recommended we hold off on antibiotics.  To be safe in regards to his quarantine offered to have him come by for a urine test on Monday  if he has recurrence of symptoms-he agrees to do so-will include urine culture and urinalysis -Does have a history of kidney stones and could consider noncontrast CT if needed if ongoing symptoms -He will need to call in for a lab visit and per this note would be cleared as would be over 14 days past diagnosis from [facility] -He is also self isolating in his home from his wife who is elderly as well.  [place] he will be in the same room but they both use masks and try to keep interactions brief  Recommended follow up:  Future Appointments  Date Time Provider [place]  02/23/2019 11:00 AM [facility]IN [facility]  03/30/2019  8:00 AM [facility] DEVICE REMOTES [facility]  06/29/2019  8:00 AM [facility] DEVICE REMOTES [facility]  08/26/2019  1:20 PM Marin Olp, MD [facility]  09/28/2019  8:00 AM [facility] DEVICE REMOTES [facility]  12/28/2019  8:00 AM [facility] DEVICE REMOTES [facility]    Lab/Order associations:   ICD-10-CM   1. Lower abdominal pain  R10.30 POC UA    Urine culture  2. Acute bilateral low back pain  without sciatica  M54.5   3. COVID-19  U07.1     Return precautions advised.  Garret Reddish, MD

## 2019-02-17 NOTE — Patient Instructions (Signed)
There are no preventive care reminders to display for this patient.  Depression screen [facility] 2/9 08/23/2018 11/17/2017 05/12/2017  Decreased Interest 0 0 1  Down, Depressed, Hopeless 0 1 1  PHQ - 2 Score 0 1 2  Altered sleeping 1 2 1   Tired, decreased energy 1 2 3   Change in appetite 0 0 0  Feeling bad or failure about yourself  0 1 2  Trouble concentrating 1 0 0  Moving slowly or fidgety/restless 0 0 0  Suicidal thoughts 0 0 0  PHQ-9 Score 3 6 8   Difficult doing work/chores Somewhat difficult Not difficult at all Somewhat difficult  Some recent data might be hidden    Recommended follow up: No follow-ups on file.

## 2019-02-17 NOTE — Telephone Encounter (Signed)
Called to Discuss with patient about Covid symptoms and the use of bamlanivimab, a monoclonal antibody infusion for those with mild to moderate Covid symptoms and at a high risk of hospitalization.     Pt is qualified for this infusion at the [facility] infusion center due to co-morbid conditions and/or a member of an at-risk group.     Patient Active Problem List   Diagnosis Date Noted  . Melena   . Long term (current) use of anticoagulants 07/08/2017  . Nephrolithiasis 03/24/2016  . Erectile dysfunction 01/19/2014  . Basal cell cancer 09/29/2012  . Solitary pulmonary nodule 07/12/2012  . Chronic systolic heart failure ([place]) 12/09/2010  . Cardiomyopathy, ischemic 10/02/2009  . Automatic implantable cardioverter-defibrillator in situ 01/16/2009  . DM (diabetes mellitus) type II, controlled, with peripheral vascular disorder ([place]) 09/14/2008  . BPH without obstruction/lower urinary tract symptoms 10/22/2007  . Hyperlipidemia associated with type 2 diabetes mellitus ([place]) 04/22/2007  . Hypertension associated with diabetes ([place]) 09/25/2006  . CAD in native artery 09/25/2006  . Osteoarthritis 09/25/2006  . Hematuria 09/25/2006    Patient states that he has not had any symptoms. Thinks he was exposed on Christmas. He does not meet criteria for infusion due to not having any symptoms. Patient advised to go to Urgent care or ED with severe symptoms.

## 2019-02-21 ENCOUNTER — Other Ambulatory Visit: Payer: Self-pay

## 2019-02-21 ENCOUNTER — Other Ambulatory Visit (INDEPENDENT_AMBULATORY_CARE_PROVIDER_SITE_OTHER): Payer: Medicare Other

## 2019-02-21 DIAGNOSIS — R1030 Lower abdominal pain, unspecified: Secondary | ICD-10-CM | POA: Diagnosis not present

## 2019-02-21 LAB — POC URINALSYSI DIPSTICK (AUTOMATED)
Bilirubin, UA: NEGATIVE
Blood, UA: NEGATIVE
Glucose, UA: NEGATIVE
Ketones, UA: NEGATIVE
Leukocytes, UA: NEGATIVE
Nitrite, UA: NEGATIVE
Protein, UA: NEGATIVE
Spec Grav, UA: >=1.03 — AB (ref 1.010–1.025)
Urobilinogen, UA: 0.2 E.U./dL
pH, UA: 5 (ref 5.0–8.0)

## 2019-02-22 LAB — URINE CULTURE
MICRO NUMBER:: 10028068
Result:: NO GROWTH
SPECIMEN QUALITY:: ADEQUATE

## 2019-02-23 ENCOUNTER — Ambulatory Visit (INDEPENDENT_AMBULATORY_CARE_PROVIDER_SITE_OTHER): Payer: Medicare Other | Admitting: General Practice

## 2019-02-23 ENCOUNTER — Other Ambulatory Visit: Payer: Self-pay

## 2019-02-23 DIAGNOSIS — Z7901 Long term (current) use of anticoagulants: Secondary | ICD-10-CM

## 2019-02-23 LAB — POCT INR: INR: 3 (ref 2.0–3.0)

## 2019-02-23 NOTE — Patient Instructions (Signed)
Pre visit review using our clinic review tool, if applicable. No additional management support is needed unless otherwise documented below in the visit note. ° °Continue to take 1/2 tablet daily except 1 tablet on Sunday and Thursdays.  Re-check in 6 weeks. ° °

## 2019-02-23 NOTE — Progress Notes (Signed)
I have reviewed and agree with note, evaluation, plan.   Wessley Emert, MD  

## 2019-03-06 ENCOUNTER — Ambulatory Visit: Payer: Medicare Other | Attending: Internal Medicine

## 2019-03-06 DIAGNOSIS — Z23 Encounter for immunization: Secondary | ICD-10-CM | POA: Insufficient documentation

## 2019-03-06 NOTE — Progress Notes (Signed)
   Covid-19 Vaccination Clinic  Name:  Joseph Malone    MRN: [id number] DOB: 01-12-1931  03/06/2019  [name] was observed post Covid-19 immunization for 30 minutes based on pre-vaccination screening without incidence. He was provided with Vaccine Information Sheet and instruction to access the V-Safe system.   [name] was instructed to call 911 with any severe reactions post vaccine: Marland Kitchen Difficulty breathing  . Swelling of your face and throat  . A fast heartbeat  . A bad rash all over your body  . Dizziness and weakness    Immunizations Administered    Name Date Dose VIS Date Route   Pfizer COVID-19 Vaccine 03/06/2019 11:17 AM 0.3 mL 01/21/2019 Intramuscular   Manufacturer: [place]   Lot: [id number]   [place]: [id number]

## 2019-03-24 ENCOUNTER — Ambulatory Visit: Payer: Medicare Other | Attending: Internal Medicine

## 2019-03-24 DIAGNOSIS — Z23 Encounter for immunization: Secondary | ICD-10-CM | POA: Insufficient documentation

## 2019-03-24 NOTE — Progress Notes (Signed)
   Covid-19 Vaccination Clinic  Name:  Joseph Malone    MRN: [id number] DOB: 28-Sep-1930  03/24/2019  [name] was observed post Covid-19 immunization for 15 minutes without incidence. He was provided with Vaccine Information Sheet and instruction to access the V-Safe system.   [name] was instructed to call 911 with any severe reactions post vaccine: Marland Kitchen Difficulty breathing  . Swelling of your face and throat  . A fast heartbeat  . A bad rash all over your body  . Dizziness and weakness    Immunizations Administered    Name Date Dose VIS Date Route   Pfizer COVID-19 Vaccine 03/24/2019 11:22 AM 0.3 mL 01/21/2019 Intramuscular   Manufacturer: [place]   Lot: [id number]   [place]: [id number]

## 2019-03-30 ENCOUNTER — Ambulatory Visit (INDEPENDENT_AMBULATORY_CARE_PROVIDER_SITE_OTHER): Payer: Medicare Other | Admitting: *Deleted

## 2019-03-30 DIAGNOSIS — I255 Ischemic cardiomyopathy: Secondary | ICD-10-CM

## 2019-03-30 LAB — CUP PACEART REMOTE DEVICE CHECK
Battery Remaining Longevity: 78 mo
Battery Voltage: 3 V
Brady Statistic RV Percent Paced: 0.01 %
Date Time Interrogation Session: 20210217001704
HighPow Impedance: 77 Ohm
Implantable Lead Implant Date: 20150422
Implantable Lead Location: 753860
Implantable Lead Model: 6935
Implantable Pulse Generator Implant Date: 20150422
Lead Channel Impedance Value: 456 Ohm
Lead Channel Impedance Value: 513 Ohm
Lead Channel Pacing Threshold Amplitude: 0.625 V
Lead Channel Pacing Threshold Pulse Width: 0.4 ms
Lead Channel Sensing Intrinsic Amplitude: 13.875 mV
Lead Channel Sensing Intrinsic Amplitude: 13.875 mV
Lead Channel Setting Pacing Amplitude: 2 V
Lead Channel Setting Pacing Pulse Width: 0.4 ms
Lead Channel Setting Sensing Sensitivity: 0.3 mV

## 2019-03-30 NOTE — Progress Notes (Signed)
ICD remote 

## 2019-04-06 ENCOUNTER — Ambulatory Visit (INDEPENDENT_AMBULATORY_CARE_PROVIDER_SITE_OTHER): Payer: Medicare Other | Admitting: General Practice

## 2019-04-06 ENCOUNTER — Other Ambulatory Visit: Payer: Self-pay

## 2019-04-06 DIAGNOSIS — Z7901 Long term (current) use of anticoagulants: Secondary | ICD-10-CM

## 2019-04-06 LAB — POCT INR: INR: 2.4 (ref 2.0–3.0)

## 2019-04-06 NOTE — Patient Instructions (Addendum)
Pre visit review using our clinic review tool, if applicable. No additional management support is needed unless otherwise documented below in the visit note. ° °Continue to take 1/2 tablet daily except 1 tablet on Sunday and Thursdays.  Re-check in 6 weeks. ° °

## 2019-04-07 NOTE — Progress Notes (Signed)
I have reviewed and agree with note, evaluation, plan.   Stephen Hunter, MD  

## 2019-05-10 ENCOUNTER — Other Ambulatory Visit: Payer: Self-pay

## 2019-05-10 ENCOUNTER — Ambulatory Visit (INDEPENDENT_AMBULATORY_CARE_PROVIDER_SITE_OTHER): Payer: Medicare Other | Admitting: Physician Assistant

## 2019-05-10 ENCOUNTER — Encounter: Payer: Self-pay | Admitting: Physician Assistant

## 2019-05-10 ENCOUNTER — Ambulatory Visit (INDEPENDENT_AMBULATORY_CARE_PROVIDER_SITE_OTHER): Payer: Medicare Other

## 2019-05-10 VITALS — BP 110/70 | HR 84 | Temp 98.0°F | Ht 71.0 in | Wt 241.4 lb

## 2019-05-10 DIAGNOSIS — M545 Low back pain, unspecified: Secondary | ICD-10-CM

## 2019-05-10 DIAGNOSIS — R200 Anesthesia of skin: Secondary | ICD-10-CM | POA: Diagnosis not present

## 2019-05-10 DIAGNOSIS — S3992XA Unspecified injury of lower back, initial encounter: Secondary | ICD-10-CM | POA: Diagnosis not present

## 2019-05-10 DIAGNOSIS — R202 Paresthesia of skin: Secondary | ICD-10-CM

## 2019-05-10 NOTE — Progress Notes (Signed)
Joseph Malone is a 86 y.o. male here for a new problem.  I acted as a Education administrator for [employer], PA-C Anselmo Pickler, LPN  History of Present Illness:   Chief Complaint  Patient presents with  . Back Pain    HPI   Back pain Pt was mowing the yard a week ago and the mower slipped and he fell off the mower and landed on left buttock. Pt c/o left sided low back pain, denies pain radiating down hip, LOC, head trauma, bruising  Pt has used Lidocaine cream and has taken Tylenol with relief. Denies new bowel or bladder incontinence since his fall, denies saddle anesthesia. Pain fluctuates between 3-8.  Numbness/Tingling of R Hand Two episodes of intermittent numbness and tingling in right hand off and on. Happened after waking and immediately resolved when he shakes his arm a bit. This has occurred twice after laying on his back all night, which he normally does not do but recently has felt comfortable to him. Denies new or unusual neck pain.   Past Medical History:  Diagnosis Date  . Anxiety   . BPH (benign prostatic hypertrophy)   . CAD (coronary artery disease)    s/p CABG  . Diverticulosis   . Diverticulosis of colon without hemorrhage 10/22/2007   Qualifier: Diagnosis of  By: Linna Darner MD, [name]    . DOE (dyspnea on exertion)   . Excess weight   . Fasting hyperglycemia   . Generalized weakness   . GERD (gastroesophageal reflux disease)   . Heart attack ([place]) 2001  . Heart failure    CHF due to ischemic CM  . History of kidney stones   . Hyperlipidemia   . Hypertension   . Ischemic cardiomyopathy   . Microscopic hematuria    [facility]  . Passive smoke exposure    former Airline pilot     Social History   Socioeconomic History  . Marital status: Married    Spouse name: Not on file  . Number of children: Not on file  . Years of education: Not on file  . Highest education level: Not on file  Occupational History  . Not on file  Tobacco Use  . Smoking status:  Passive Smoke Exposure - Never Smoker  . Smokeless tobacco: Never Used  Substance and Sexual Activity  . Alcohol use: No  . Drug use: No  . Sexual activity: Never  Other Topics Concern  . Not on file  Social History Narrative   Married 1957. 4 children 2 boys 2 girls. 15 grandkids.  4 greatgrandkids.       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 Retired from [place] and worked 30 years.       Hobbies: genealogy, travel      No HCPOA-advised to do this.    Social Determinants of Health   Financial Resource Strain:   . Difficulty of Paying Living Expenses:   Food Insecurity:   . Worried About Charity fundraiser in the Last Year:   . Arboriculturist in the Last Year:   Transportation Needs:   . Film/video editor (Medical):   Marland Kitchen Lack of Transportation (Non-Medical):   Physical Activity:   . Days of Exercise per Week:   . Minutes of Exercise per Session:   Stress:   . Feeling of Stress :   Social Connections:   . Frequency of Communication with Friends and Family:   . Frequency of Social Gatherings with Friends and  Family:   . Attends Religious Services:   . Active Member of Clubs or Organizations:   . Attends Archivist Meetings:   Marland Kitchen Marital Status:   Intimate Partner Violence:   . Fear of Current or Ex-Partner:   . Emotionally Abused:   Marland Kitchen Physically Abused:   . Sexually Abused:     Past Surgical History:  Procedure Laterality Date  . APPENDECTOMY    . BIOPSY  10/21/2018   Procedure: BIOPSY;  Surgeon: Yetta Flock, MD;  Location: [facility];  Service: Gastroenterology;;  . CARDIAC CATHETERIZATION  2000  . CARDIAC DEFIBRILLATOR PLACEMENT  2005   [employer]; s/p ICD gen change and RV lead revision April 2015  . CATARACT EXTRACTION  2008   bilateral with lens implant  . COLONOSCOPY  2004   Tics; [facility]  . CORONARY ARTERY BYPASS GRAFT  2000   1 vessel  . CYSTOSCOPY  06/2012   Dr [name]  . ESOPHAGOGASTRODUODENOSCOPY (EGD) WITH  PROPOFOL N/A 10/21/2018   Procedure: ESOPHAGOGASTRODUODENOSCOPY (EGD) WITH PROPOFOL;  Surgeon: Yetta Flock, MD;  [facility];  Service: Gastroenterology;  Laterality: N/A;  . HOT HEMOSTASIS N/A 10/21/2018   Procedure: HOT HEMOSTASIS (ARGON PLASMA COAGULATION/BICAP);  Surgeon: Yetta Flock, MD;  Location: [facility];  Service: Gastroenterology;  Laterality: N/A;  . IMPLANTABLE CARDIOVERTER DEFIBRILLATOR (ICD) GENERATOR CHANGE N/A 06/01/2013   Procedure: ICD GENERATOR CHANGE;  Surgeon: [name], MD;  Location: [facility] C[facility] LAB;  Service: Cardiovascular;  Laterality: N/A;  . LEAD REVISION N/A 06/01/2013   Procedure: LEAD REVISION;  Surgeon: [name], MD;  Location: [facility] CATH LAB;  Service: Cardiovascular;  Laterality: N/A;  . PILONIDAL CYST EXCISION    . RIGHT/LEFT HEART CATH AND CORONARY/GRAFT ANGIOGRAPHY N/A 06/12/2017   Procedure: RIGHT/LEFT HEART CATH AND CORONARY/GRAFT ANGIOGRAPHY;  Surgeon: [name], MD;  Location: [facility];  Service: Cardiovascular;  Laterality: N/A;  . TRANSTHORACIC ECHOCARDIOGRAM  08/29/2005    Family History  Problem Relation Age of Onset  . Hypertension Father   . Heart attack Father 79  . Diabetes Brother        X 2  . Nephrolithiasis Brother   . Heart attack Brother 34  . Heart attack Paternal Uncle 36  . Sudden death Paternal Grandfather 31       ? heat stroke  . Lung cancer Sister        "Asian lung cancer"  . Stroke Neg Hx   . Colon cancer Neg Hx   . Esophageal cancer Neg Hx   . Rectal cancer Neg Hx   . Stomach cancer Neg Hx     Allergies  Allergen Reactions  . Penicillins Rash and Other (See Comments)    Because of a history of documented adverse serious drug reaction;Medi Alert bracelet  is recommended Has patient had a PCN reaction causing immediate rash, facial/tongue/throat swelling, SOB or lightheadedness with hypotension: No Has patient had a PCN reaction causing severe rash involving mucus  membranes or skin necrosis: No Has patient had a PCN reaction that required hospitalization: No Has patient had a PCN reaction occurring within the last 10 years: No If all of the above answers are "NO", t    Current Medications:   Current Outpatient Medications:  .  acetaminophen (TYLENOL) 500 MG tablet, Take 500 mg by mouth at bedtime. , Disp: , Rfl:  .  aspirin 81 MG tablet, Take 1 tablet (81 mg total) by mouth every evening.,  Disp: , Rfl:  .  carvedilol (COREG) 25 MG tablet, Take 0.5 tablets (12.5 mg total) by mouth 2 (two) times daily., Disp: 90 tablet, Rfl: 1 .  Cholecalciferol (VITAMIN D3) 2000 UNITS TABS, Take 2,000 Units by mouth daily. , Disp: , Rfl:  .  folic acid (FOLVITE) A999333 MCG tablet, Take 400 mcg by mouth daily.  , Disp: , Rfl:  .  furosemide (LASIX) 40 MG tablet, Take 1 tablet (40 mg total) by mouth daily. (Patient taking differently: Take 40 mg by mouth as needed. ), Disp: 90 tablet, Rfl: 3 .  Glucosamine HCl 1500 MG TABS, Take 1,500 mg by mouth every evening. , Disp: , Rfl:  .  losartan (COZAAR) 50 MG tablet, TAKE 1 TABLET BY MOUTH DAILY. GENERIC EQUIVALENT FOR COZAAR (Patient taking differently: Take 50 mg by mouth daily. ), Disp: 90 tablet, Rfl: 3 .  Multiple Vitamin (MULTIVITAMIN WITH MINERALS) TABS, Take 1 tablet by mouth daily. , Disp: , Rfl:  .  pantoprazole (PROTONIX) 40 MG tablet, Take 1 tablet (40 mg total) by mouth daily., Disp: 90 tablet, Rfl: 3 .  potassium chloride SA (K-DUR) 20 MEQ tablet, TAKE 1 TABLET BY MOUTH DAILY, GENERIC EQUIVALENT FOR KLOR-CON. (Patient taking differently: 20 mEq daily. ), Disp: 90 tablet, Rfl: 2 .  simvastatin (ZOCOR) 40 MG tablet, TAKE ONE-HALF TABLET BY MOUTH AT BEDTIME, Disp: 45 tablet, Rfl: 3 .  spironolactone (ALDACTONE) 25 MG tablet, TAKE 1/2 TABLET(12.5 MG) BY MOUTH DAILY (Patient taking differently: TAKE 1/2 TABLET(12.5 MG) BY MOUTH PRN), Disp: 45 tablet, Rfl: 3 .  tiZANidine (ZANAFLEX) 2 MG tablet, Take 1 tablet (2 mg total) by  mouth daily as needed for muscle spasms., Disp: 90 tablet, Rfl: 0 .  warfarin (COUMADIN) 5 MG tablet, Take 1/2 tablet daily except take 1 tablet on Sunday and Tuesday or Take as directed by anticoagulation clinic  90 day, Disp: 90 tablet, Rfl: 3   Review of Systems:   ROS  Negative unless otherwise specified per HPI.   Vitals:   Vitals:   05/10/19 1404  BP: 110/70  Pulse: 84  Temp: 98 F (36.7 C)  TempSrc: Temporal  SpO2: 94%  Weight: 241 lb 6.1 oz (109.5 kg)  Height: 5\' 11"  (1.803 m)     Body mass index is 33.67 kg/m.  Physical Exam:   Physical Exam Vitals and nursing note reviewed.  Constitutional:      Appearance: He is well-developed.  HENT:     Head: Normocephalic.  Eyes:     Conjunctiva/sclera: Conjunctivae normal.     Pupils: Pupils are equal, round, and reactive to light.  Pulmonary:     Effort: Pulmonary effort is normal.  Musculoskeletal:        General: Normal range of motion.     Cervical back: Normal range of motion.     Comments: No significantly decreased ROM 2/2 pain with flexion/extension, lateral side bends, or rotation. Reproducible tenderness with deep palpation to left lumbar paraspinal muscles. No bony tenderness. No evidence of erythema, rash or ecchymosis. Negative STLR bilaterally.   Skin:    General: Skin is warm and dry.  Neurological:     General: No focal deficit present.     Mental Status: He is alert and oriented to person, place, and time.     Cranial Nerves: Cranial nerves are intact.     Sensory: Sensation is intact.     Motor: Motor function is intact.     Coordination: Coordination is intact.  Comments: Grip strength 5/5 bilaterally  Psychiatric:        Behavior: Behavior normal.        Thought Content: Thought content normal.        Judgment: Judgment normal.      Assessment and Plan:   Mattix was seen today for back pain.  Diagnoses and all orders for this visit:  Lumbar pain Xray pending -- no acute fracture  on my initial read. Will continue to treat conservatively with topical treatment and prn tinzanidine. Offered referral to Lyndee Hensen for PT however he would like to wait and see. Worsening precautions and red flags discussed. -     DG Lumbar Spine Complete; Future  Numbness and tingling in right hand No red flags on exam. Suspect symptoms are due to the way that he is laying. Worsening precautions and red flags reviewed.  Reviewed expectations re: course of current medical issues. Discussed self-management of symptoms. Outlined signs and symptoms indicating need for more acute intervention. Patient verbalized understanding and all questions were answered. See orders for this visit as documented in the electronic medical record. Patient received an After-Visit Summary.  CMA or LPN served as scribe during this visit. History, Physical, and Plan performed by medical provider. The above documentation has been reviewed and is accurate and complete.  Inda Coke, PA-C

## 2019-05-18 ENCOUNTER — Other Ambulatory Visit: Payer: Self-pay

## 2019-05-18 ENCOUNTER — Ambulatory Visit (INDEPENDENT_AMBULATORY_CARE_PROVIDER_SITE_OTHER): Payer: Medicare Other | Admitting: General Practice

## 2019-05-18 DIAGNOSIS — Z7901 Long term (current) use of anticoagulants: Secondary | ICD-10-CM | POA: Diagnosis not present

## 2019-05-18 LAB — POCT INR: INR: 2.4 (ref 2.0–3.0)

## 2019-05-18 NOTE — Patient Instructions (Addendum)
Pre visit review using our clinic review tool, if applicable. No additional management support is needed unless otherwise documented below in the visit note. ° °Continue to take 1/2 tablet daily except 1 tablet on Sunday and Thursdays.  Re-check in 6 weeks. ° °

## 2019-05-18 NOTE — Progress Notes (Signed)
I have reviewed and agree with note, evaluation, plan.   Stephen Hunter, MD  

## 2019-05-26 ENCOUNTER — Other Ambulatory Visit (HOSPITAL_COMMUNITY): Payer: Self-pay

## 2019-05-26 MED ORDER — POTASSIUM CHLORIDE CRYS ER 20 MEQ PO TBCR: EXTENDED_RELEASE_TABLET | ORAL | 2 refills | Status: DC

## 2019-06-20 ENCOUNTER — Other Ambulatory Visit: Payer: Self-pay

## 2019-06-20 ENCOUNTER — Ambulatory Visit (HOSPITAL_COMMUNITY)
Admission: RE | Admit: 2019-06-20 | Discharge: 2019-06-20 | Disposition: A | Discharge status: Home / Self Care | Payer: Medicare Other | Source: Ambulatory Visit | Attending: Internal Medicine | Admitting: Internal Medicine

## 2019-06-20 VITALS — BP 124/56 | HR 86 | Wt 242.2 lb

## 2019-06-20 DIAGNOSIS — Z961 Presence of intraocular lens: Secondary | ICD-10-CM | POA: Diagnosis not present

## 2019-06-20 DIAGNOSIS — Z951 Presence of aortocoronary bypass graft: Secondary | ICD-10-CM | POA: Insufficient documentation

## 2019-06-20 DIAGNOSIS — Z801 Family history of malignant neoplasm of trachea, bronchus and lung: Secondary | ICD-10-CM | POA: Diagnosis not present

## 2019-06-20 DIAGNOSIS — Z7982 Long term (current) use of aspirin: Secondary | ICD-10-CM | POA: Insufficient documentation

## 2019-06-20 DIAGNOSIS — I5022 Chronic systolic (congestive) heart failure: Secondary | ICD-10-CM | POA: Insufficient documentation

## 2019-06-20 DIAGNOSIS — Z880 Allergy status to penicillin: Secondary | ICD-10-CM | POA: Insufficient documentation

## 2019-06-20 DIAGNOSIS — Z7901 Long term (current) use of anticoagulants: Secondary | ICD-10-CM | POA: Diagnosis not present

## 2019-06-20 DIAGNOSIS — Z833 Family history of diabetes mellitus: Secondary | ICD-10-CM | POA: Insufficient documentation

## 2019-06-20 DIAGNOSIS — I255 Ischemic cardiomyopathy: Secondary | ICD-10-CM | POA: Diagnosis not present

## 2019-06-20 DIAGNOSIS — Z6833 Body mass index (BMI) 33.0-33.9, adult: Secondary | ICD-10-CM | POA: Insufficient documentation

## 2019-06-20 DIAGNOSIS — I2510 Atherosclerotic heart disease of native coronary artery without angina pectoris: Secondary | ICD-10-CM | POA: Insufficient documentation

## 2019-06-20 DIAGNOSIS — E669 Obesity, unspecified: Secondary | ICD-10-CM | POA: Diagnosis not present

## 2019-06-20 DIAGNOSIS — Z95810 Presence of automatic (implantable) cardiac defibrillator: Secondary | ICD-10-CM | POA: Diagnosis not present

## 2019-06-20 DIAGNOSIS — I252 Old myocardial infarction: Secondary | ICD-10-CM | POA: Insufficient documentation

## 2019-06-20 DIAGNOSIS — E785 Hyperlipidemia, unspecified: Secondary | ICD-10-CM | POA: Insufficient documentation

## 2019-06-20 DIAGNOSIS — I10 Essential (primary) hypertension: Secondary | ICD-10-CM | POA: Diagnosis not present

## 2019-06-20 DIAGNOSIS — I509 Heart failure, unspecified: Secondary | ICD-10-CM | POA: Diagnosis present

## 2019-06-20 DIAGNOSIS — I110 Hypertensive heart disease with heart failure: Secondary | ICD-10-CM | POA: Diagnosis not present

## 2019-06-20 DIAGNOSIS — Z9841 Cataract extraction status, right eye: Secondary | ICD-10-CM | POA: Insufficient documentation

## 2019-06-20 DIAGNOSIS — Z79899 Other long term (current) drug therapy: Secondary | ICD-10-CM | POA: Diagnosis not present

## 2019-06-20 DIAGNOSIS — Z9842 Cataract extraction status, left eye: Secondary | ICD-10-CM | POA: Diagnosis not present

## 2019-06-20 DIAGNOSIS — Z8249 Family history of ischemic heart disease and other diseases of the circulatory system: Secondary | ICD-10-CM | POA: Diagnosis not present

## 2019-06-20 DIAGNOSIS — Z87442 Personal history of urinary calculi: Secondary | ICD-10-CM | POA: Insufficient documentation

## 2019-06-20 DIAGNOSIS — K219 Gastro-esophageal reflux disease without esophagitis: Secondary | ICD-10-CM | POA: Insufficient documentation

## 2019-06-20 NOTE — Patient Instructions (Signed)
Please call our office in January 2022 to schedule your follow up appointment  If you have any questions or concerns before your next appointment please send [place] a message through [place] or call our office at [phone].  At the [facility], you and your health needs are our priority. As part of our continuing mission to provide you with exceptional heart care, we have created designated Provider Care Teams. These Care Teams include your primary Cardiologist (physician) and Advanced Practice Providers (APPs- Physician Assistants and Nurse Practitioners) who all work together to provide you with the care you need, when you need it.   You may see any of the following providers on your designated Care Team at your next follow up: Marland Kitchen Dr Glori Bickers . Dr Loralie Champagne . Darrick Grinder, NP . Lyda Jester, PA . Audry Riles, PharmD   Please be sure to bring in all your medications bottles to every appointment.

## 2019-06-20 NOTE — Progress Notes (Signed)
Advanced Heart Failure Clinic Note   Date:  06/20/2019   ID:  TREVELL STJEAN, DOB 1931-01-13, MRN [id number]  Location: Home  Provider location: [facility] Type of Visit: Established patient  PCP:  Marin Olp, MD  Cardiologist:  No primary care provider on file. Primary HF: Kynan Peasley  Chief Complaint: Heart Failure follow-up   History of Present Illness:  Zuriel is a pleasant 86 year old male with a history of coronary artery disease status post large anterior MI with coronary  bypass grafting in 2001 with a LIMA to the LAD.  He has history of  congestive heart failure secondary to resultant ischemic cardiomyopathy,  ejection fraction however is in the 35%.  He is status post  single-chamber ICD.   The rest of his medical history is notable for obesity, hypertension,  hyperlipidemia, and glucose intolerance.  Cath in 5/19 with stable CAD with patent LIMA to LAD.  He presents today for routine f/u. Got Covid in December. Felt fatigued for a day and then got better. Remains very weak and fatigued. Can cut the grass but gets weak and fatigued with any walking.  Chronic chest soreness. No edema, orthopnea or PND.   RHC/LHC 06/12/2017  Ao = 143/77 (105) LV 145/8 RA = 3 RV = 26/4 PA = 27/5 (12) PCW = 9 Fick cardiac output/index = 4.4/2.0 PVR = 0.7 WU FA sat = 97% PA sat = 62%, 63% Assessment: 1. Left-dominant coronary system with chronically-occluded LAD 2. Non-obstructive CAD elsewhere 3. Patent LIMA to LAD 4. LV gram not performed due to LV thrombus 5. Normal right heart pressure with mildly reduced cardiac output  ECHO 05/2017 EF 30-35% with   [name] denies symptoms worrisome for COVID 19.   Past Medical History:  Diagnosis Date  . Anxiety   . BPH (benign prostatic hypertrophy)   . CAD (coronary artery disease)    s/p CABG  . Diverticulosis   . Diverticulosis of colon without hemorrhage 10/22/2007   Qualifier:  Diagnosis of  By: Linna Darner MD, [name]    . DOE (dyspnea on exertion)   . Excess weight   . Fasting hyperglycemia   . Generalized weakness   . GERD (gastroesophageal reflux disease)   . Heart attack ([place]) 2001  . Heart failure    CHF due to ischemic CM  . History of kidney stones   . Hyperlipidemia   . Hypertension   . Ischemic cardiomyopathy   . Microscopic hematuria    [facility]  . 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 APPENDECTOMY    . BIOPSY  10/21/2018   Procedure: BIOPSY;  Surgeon: Yetta Flock, MD;  [facility]: [facility];  Service: Gastroenterology;;  . CARDIAC CATHETERIZATION  2000  . CARDIAC DEFIBRILLATOR PLACEMENT  2005   [employer]; s/p ICD gen change and RV lead revision April 2015  . CATARACT EXTRACTION  2008   bilateral with lens implant  . COLONOSCOPY  2004   Tics; [facility]  . CORONARY ARTERY BYPASS GRAFT  2000   1 vessel  . CYSTOSCOPY  06/2012   Dr [name]  . ESOPHAGOGASTRODUODENOSCOPY (EGD) WITH PROPOFOL N/A 10/21/2018   Procedure: ESOPHAGOGASTRODUODENOSCOPY (EGD) WITH PROPOFOL;  Surgeon: Yetta Flock, MD;  [facility]: [facility];  Service: Gastroenterology;  Laterality: N/A;  . HOT HEMOSTASIS N/A 10/21/2018   Procedure: HOT HEMOSTASIS (ARGON PLASMA COAGULATION/BICAP);  Surgeon: [place] Cellar  P, MD;  Location: [facility];  Service: Gastroenterology;  Laterality: N/A;  . IMPLANTABLE CARDIOVERTER DEFIBRILLATOR (ICD) GENERATOR CHANGE N/A 06/01/2013   Procedure: ICD GENERATOR CHANGE;  Surgeon: [name], MD;  Location: [facility] CATH LAB;  Service: Cardiovascular;  Laterality: N/A;  . LEAD REVISION N/A 06/01/2013   Procedure: LEAD REVISION;  Surgeon: [name], MD;  Location: [facility] CATH LAB;  Service: Cardiovascular;  Laterality: N/A;  . PILONIDAL CYST EXCISION    . RIGHT/LEFT HEART CATH AND CORONARY/GRAFT ANGIOGRAPHY N/A 06/12/2017   Procedure: RIGHT/LEFT HEART CATH AND  CORONARY/GRAFT ANGIOGRAPHY;  Surgeon: [name], MD;  Location: [place] [facility];  Service: Cardiovascular;  Laterality: N/A;  . TRANSTHORACIC ECHOCARDIOGRAM  08/29/2005     Current Outpatient Medications  Medication Sig Dispense Refill  . acetaminophen (TYLENOL) 500 MG tablet Take 500 mg by mouth at bedtime.     Marland Kitchen aspirin 81 MG tablet Take 1 tablet (81 mg total) by mouth every evening.    . carvedilol (COREG) 25 MG tablet Take 0.5 tablets (12.5 mg total) by mouth 2 (two) times daily. 90 tablet 1  . Cholecalciferol (VITAMIN D3) 2000 UNITS TABS Take 2,000 Units by mouth daily.     . folic acid (FOLVITE) A999333 MCG tablet Take 400 mcg by mouth daily.      . furosemide (LASIX) 40 MG tablet Take 40 mg by mouth daily as needed.    . Glucosamine HCl 1500 MG TABS Take 1,500 mg by mouth every evening.     Marland Kitchen losartan (COZAAR) 50 MG tablet TAKE 1 TABLET BY MOUTH DAILY. GENERIC EQUIVALENT FOR COZAAR 90 tablet 3  . Multiple Vitamin (MULTIVITAMIN WITH MINERALS) TABS Take 1 tablet by mouth daily.     . pantoprazole (PROTONIX) 40 MG tablet Take 1 tablet (40 mg total) by mouth daily. 90 tablet 3  . potassium chloride SA (KLOR-CON) 20 MEQ tablet TAKE 1 TABLET BY MOUTH DAILY, GENERIC EQUIVALENT FOR KLOR-CON. 90 tablet 2  . simvastatin (ZOCOR) 40 MG tablet TAKE ONE-HALF TABLET BY MOUTH AT BEDTIME 45 tablet 3  . spironolactone (ALDACTONE) 25 MG tablet Take 12.5 mg by mouth daily as needed.    Marland Kitchen tiZANidine (ZANAFLEX) 2 MG tablet Take 1 tablet (2 mg total) by mouth daily as needed for muscle spasms. 90 tablet 0  . warfarin (COUMADIN) 5 MG tablet Take 1/2 tablet daily except take 1 tablet on Sunday and Tuesday or Take as directed by anticoagulation clinic  90 day 90 tablet 3   No current facility-administered medications for this encounter.    Allergies:   Penicillins   Social History:  The patient  reports that he is a non-smoker but has been exposed to tobacco smoke. He has never used smokeless  tobacco. He reports that he does not drink alcohol or use drugs.   Family History:  The patient's family history includes Diabetes in his brother; Heart attack (age of onset: 63) in his brother, father, and paternal uncle; Hypertension in his father; Lung cancer in his sister; Nephrolithiasis in his brother; Sudden death (age of onset: 51) in his paternal grandfather.   ROS:  Please see the history of present illness.   All other systems are personally reviewed and negative.   Vitals:   06/20/19 1516  BP: (!) 124/56  Pulse: 86  SpO2: 95%  Weight: 109.9 kg (242 lb 4 oz)    Exam:  General:  Well appearing. No resp difficulty HEENT: normal Neck: supple. no JVD. Carotids  2+ bilat; no bruits. No lymphadenopathy or thryomegaly appreciated. Cor: PMI nondisplaced. Regular rate & rhythm. No rubs, gallops or murmurs. Lungs: clear Abdomen: soft, nontender, nondistended. No hepatosplenomegaly. No bruits or masses. Good bowel sounds. Extremities: no cyanosis, clubbing, rash, edema Neuro: alert & orientedx3, cranial nerves grossly intact. moves all 4 extremities w/o difficulty. Affect pleasant   Recent Labs: 07/08/2018: Pro B Natriuretic peptide (BNP) 81.0; TSH 0.50 01/25/2019: ALT 21; BUN 19; Creatinine, Ser 1.20; Hemoglobin 14.5; Platelets 170.0; Potassium 4.4; Sodium 141  Personally reviewed   Wt Readings from Last 3 Encounters:  06/20/19 109.9 kg (242 lb 4 oz)  05/10/19 109.5 kg (241 lb 6.1 oz)  02/17/19 103.9 kg (229 lb)      ASSESSMENT AND PLAN:  1) CAD: s/p CABG 2001with LIMA to LAD  - Stable. No s/s angina.  - Cath  5/19 with intact revascularization and normal filling pressures  2) Chronic systolic HF: ICM,  - Echo 05/2017 EF 30-35% with apical thrombus. [employer] ICD. No shocks - Cath with 5/19 with intact revascularization and normal filling pressures  - Volume status ok. Weight stable. No edema. Continue lasix 40 mg daily.  - Continue 12.5 mg spiro daily.  - Continue coreg  12.5 mg twice a day + losartan 50 mg daily.  - Will defer SGLT2i at this point - Given ongoing fatigue and balance issues[facility] and [facility] exercise program. We also discussed PT/OT evaluation but he prefers to defer that for now 4) HTN - Blood pressure well controlled. Continue current regimen. 5) HLD - managed by PCP. Continue statin. No change.  6) Apical Thrombus - Remains on coumadin. No bleeding    Glori Bickers, MD  06/20/2019 3:26 PM  [facility] [place] [address] [facility] [place] [address] [phone] (office) [phone] (fax)

## 2019-06-28 LAB — CUP PACEART REMOTE DEVICE CHECK
Battery Remaining Longevity: 74 mo
Battery Voltage: 2.99 V
Brady Statistic RV Percent Paced: 0.01 %
Date Time Interrogation Session: 20210518125403
HighPow Impedance: 71 Ohm
Implantable Lead Implant Date: 20150422
Implantable Lead Location: 753860
Implantable Lead Model: 6935
Implantable Pulse Generator Implant Date: 20150422
Lead Channel Impedance Value: 418 Ohm
Lead Channel Impedance Value: 513 Ohm
Lead Channel Pacing Threshold Amplitude: 0.5 V
Lead Channel Pacing Threshold Pulse Width: 0.4 ms
Lead Channel Sensing Intrinsic Amplitude: 11.625 mV
Lead Channel Sensing Intrinsic Amplitude: 11.625 mV
Lead Channel Setting Pacing Amplitude: 2 V
Lead Channel Setting Pacing Pulse Width: 0.4 ms
Lead Channel Setting Sensing Sensitivity: 0.3 mV

## 2019-06-29 ENCOUNTER — Other Ambulatory Visit: Payer: Self-pay

## 2019-06-29 ENCOUNTER — Ambulatory Visit (INDEPENDENT_AMBULATORY_CARE_PROVIDER_SITE_OTHER): Payer: Medicare Other | Admitting: General Practice

## 2019-06-29 ENCOUNTER — Ambulatory Visit (INDEPENDENT_AMBULATORY_CARE_PROVIDER_SITE_OTHER): Payer: Medicare Other | Admitting: *Deleted

## 2019-06-29 DIAGNOSIS — I5022 Chronic systolic (congestive) heart failure: Secondary | ICD-10-CM

## 2019-06-29 DIAGNOSIS — Z7901 Long term (current) use of anticoagulants: Secondary | ICD-10-CM

## 2019-06-29 DIAGNOSIS — I255 Ischemic cardiomyopathy: Secondary | ICD-10-CM

## 2019-06-29 LAB — POCT INR: INR: 2.4 (ref 2.0–3.0)

## 2019-06-29 NOTE — Progress Notes (Signed)
I have reviewed and agree with note, evaluation, plan.   Stephen Hunter, MD  

## 2019-06-29 NOTE — Patient Instructions (Addendum)
Pre visit review using our clinic review tool, if applicable. No additional management support is needed unless otherwise documented below in the visit note. ° °Continue to take 1/2 tablet daily except 1 tablet on Sunday and Thursdays.  Re-check in 6 weeks. ° °

## 2019-07-01 NOTE — Progress Notes (Signed)
Remote ICD transmission.   

## 2019-07-08 ENCOUNTER — Other Ambulatory Visit (HOSPITAL_COMMUNITY): Payer: Self-pay

## 2019-07-08 DIAGNOSIS — I5022 Chronic systolic (congestive) heart failure: Secondary | ICD-10-CM

## 2019-07-08 MED ORDER — CARVEDILOL 25 MG PO TABS: 12.5000 mg | ORAL_TABLET | Freq: Two times a day (BID) | ORAL | 3 refills | Status: DC

## 2019-08-08 ENCOUNTER — Other Ambulatory Visit (HOSPITAL_COMMUNITY): Payer: Self-pay

## 2019-08-08 MED ORDER — LOSARTAN POTASSIUM 50 MG PO TABS: ORAL_TABLET | ORAL | 3 refills | Status: DC

## 2019-08-09 DIAGNOSIS — Z961 Presence of intraocular lens: Secondary | ICD-10-CM | POA: Diagnosis not present

## 2019-08-09 DIAGNOSIS — H43811 Vitreous degeneration, right eye: Secondary | ICD-10-CM | POA: Diagnosis not present

## 2019-08-09 DIAGNOSIS — E119 Type 2 diabetes mellitus without complications: Secondary | ICD-10-CM | POA: Diagnosis not present

## 2019-08-09 LAB — HM DIABETES EYE EXAM

## 2019-08-10 ENCOUNTER — Other Ambulatory Visit: Payer: Self-pay

## 2019-08-10 ENCOUNTER — Ambulatory Visit (INDEPENDENT_AMBULATORY_CARE_PROVIDER_SITE_OTHER): Payer: Medicare Other | Admitting: General Practice

## 2019-08-10 DIAGNOSIS — Z7901 Long term (current) use of anticoagulants: Secondary | ICD-10-CM | POA: Diagnosis not present

## 2019-08-10 LAB — POCT INR: INR: 2.1 (ref 2.0–3.0)

## 2019-08-10 NOTE — Progress Notes (Signed)
I have reviewed and agree with note, evaluation, plan.   Cedar Ditullio, MD  

## 2019-08-10 NOTE — Patient Instructions (Signed)
Pre visit review using our clinic review tool, if applicable. No additional management support is needed unless otherwise documented below in the visit note. 

## 2019-08-22 ENCOUNTER — Telehealth: Payer: Self-pay | Admitting: Family Medicine

## 2019-08-22 NOTE — Telephone Encounter (Signed)
Left message for patient to call back and schedule Medicare Annual Wellness Visit (AWV) either virtually/audio only OR in office. Whatever the patients preference is.  Last AWV 08/23/18; please schedule 08/24/19 OR AFTER with [facility]-Nurse Health Advisor at [place].  This should be a 45 minute visit.

## 2019-08-26 ENCOUNTER — Other Ambulatory Visit: Payer: Self-pay

## 2019-08-26 ENCOUNTER — Encounter: Payer: Self-pay | Admitting: Family Medicine

## 2019-08-26 ENCOUNTER — Ambulatory Visit (INDEPENDENT_AMBULATORY_CARE_PROVIDER_SITE_OTHER): Payer: Medicare Other | Admitting: Family Medicine

## 2019-08-26 VITALS — BP 120/74 | HR 80 | Temp 98.1°F | Ht 71.0 in | Wt 239.0 lb

## 2019-08-26 DIAGNOSIS — I5022 Chronic systolic (congestive) heart failure: Secondary | ICD-10-CM

## 2019-08-26 DIAGNOSIS — Z95810 Presence of automatic (implantable) cardiac defibrillator: Secondary | ICD-10-CM

## 2019-08-26 DIAGNOSIS — E1151 Type 2 diabetes mellitus with diabetic peripheral angiopathy without gangrene: Secondary | ICD-10-CM

## 2019-08-26 DIAGNOSIS — E1169 Type 2 diabetes mellitus with other specified complication: Secondary | ICD-10-CM | POA: Diagnosis not present

## 2019-08-26 DIAGNOSIS — I2510 Atherosclerotic heart disease of native coronary artery without angina pectoris: Secondary | ICD-10-CM

## 2019-08-26 DIAGNOSIS — I255 Ischemic cardiomyopathy: Secondary | ICD-10-CM

## 2019-08-26 DIAGNOSIS — Z0000 Encounter for general adult medical examination without abnormal findings: Secondary | ICD-10-CM | POA: Diagnosis not present

## 2019-08-26 DIAGNOSIS — E1159 Type 2 diabetes mellitus with other circulatory complications: Secondary | ICD-10-CM | POA: Diagnosis not present

## 2019-08-26 DIAGNOSIS — B078 Other viral warts: Secondary | ICD-10-CM | POA: Diagnosis not present

## 2019-08-26 DIAGNOSIS — E785 Hyperlipidemia, unspecified: Secondary | ICD-10-CM

## 2019-08-26 DIAGNOSIS — I10 Essential (primary) hypertension: Secondary | ICD-10-CM

## 2019-08-26 DIAGNOSIS — D225 Melanocytic nevi of trunk: Secondary | ICD-10-CM | POA: Diagnosis not present

## 2019-08-26 DIAGNOSIS — I152 Hypertension secondary to endocrine disorders: Secondary | ICD-10-CM

## 2019-08-26 LAB — CBC WITH DIFFERENTIAL/PLATELET
Basophils Absolute: 0.1 10*3/uL (ref 0.0–0.1)
Basophils Relative: 0.9 % (ref 0.0–3.0)
Eosinophils Absolute: 0.2 10*3/uL (ref 0.0–0.7)
Eosinophils Relative: 2.4 % (ref 0.0–5.0)
HCT: 43.7 % (ref 39.0–52.0)
Hemoglobin: 14.9 g/dL (ref 13.0–17.0)
Lymphocytes Relative: 34.7 % (ref 12.0–46.0)
Lymphs Abs: 2.7 10*3/uL (ref 0.7–4.0)
MCHC: 34.1 g/dL (ref 30.0–36.0)
MCV: 92.9 fl (ref 78.0–100.0)
Monocytes Absolute: 0.6 10*3/uL (ref 0.1–1.0)
Monocytes Relative: 8 % (ref 3.0–12.0)
Neutro Abs: 4.2 10*3/uL (ref 1.4–7.7)
Neutrophils Relative %: 54 % (ref 43.0–77.0)
Platelets: 155 10*3/uL (ref 150.0–400.0)
RBC: 4.71 Mil/uL (ref 4.22–5.81)
RDW: 13 % (ref 11.5–15.5)
WBC: 7.7 10*3/uL (ref 4.0–10.5)

## 2019-08-26 LAB — HEMOGLOBIN A1C: Hgb A1c MFr Bld: 7.2 % — ABNORMAL HIGH (ref 4.6–6.5)

## 2019-08-26 LAB — COMPREHENSIVE METABOLIC PANEL
ALT: 19 U/L (ref 0–53)
AST: 19 U/L (ref 0–37)
Albumin: 4.3 g/dL (ref 3.5–5.2)
Alkaline Phosphatase: 55 U/L (ref 39–117)
BUN: 25 mg/dL — ABNORMAL HIGH (ref 6–23)
CO2: 28 mEq/L (ref 19–32)
Calcium: 9.8 mg/dL (ref 8.4–10.5)
Chloride: 102 mEq/L (ref 96–112)
Creatinine, Ser: 1.22 mg/dL (ref 0.40–1.50)
GFR: 56.12 mL/min — ABNORMAL LOW (ref >60.00)
Glucose, Bld: 143 mg/dL — ABNORMAL HIGH (ref 70–99)
Potassium: 4.6 mEq/L (ref 3.5–5.1)
Sodium: 137 mEq/L (ref 135–145)
Total Bilirubin: 0.7 mg/dL (ref 0.2–1.2)
Total Protein: 6.8 g/dL (ref 6.0–8.3)

## 2019-08-26 LAB — LIPID PANEL
Cholesterol: 149 mg/dL (ref 0–200)
HDL: 41.6 mg/dL (ref >39.00)
LDL Cholesterol: 69 mg/dL (ref 0–99)
NonHDL: 107.28
Total CHOL/HDL Ratio: 4
Triglycerides: 190 mg/dL — ABNORMAL HIGH (ref 0.0–149.0)
VLDL: 38 mg/dL (ref 0.0–40.0)

## 2019-08-26 NOTE — Patient Instructions (Addendum)
Health Maintenance Due  Topic Date Due  . HEMOGLOBIN A1C will order today  07/26/2019  . FOOT EXAM  Completed in office.  08/23/2019   Consider visit with sports medicine Dr. Charlann Boxer osteopathic medicine  Please check with your pharmacy to see if they have the shingrix vaccine. If they do- please get this immunization and update [place] by phone call or mychart with dates you receive the vaccine  Please stop by lab before you go If you have mychart- we will send your results within 3 business days of [place] receiving them.  If you do not have mychart- we will call you about results within 5 business days of [place] receiving them.   Recommended follow up: Return in about 6 months (around 02/26/2020) for follow up- or sooner if needed.

## 2019-08-26 NOTE — Progress Notes (Signed)
Phone: [phone]   Subjective:  Patient presents today for their annual physical. Chief complaint-noted.   See problem oriented charting- Review of Systems  Constitutional: Positive for malaise/fatigue.  HENT: Positive for hearing loss.   Eyes: Positive for pain.       Had area removed from lid today   Respiratory: Negative.   Cardiovascular: Positive for chest pain.       On going for 5 years no change in symptoms.   Gastrointestinal: Positive for diarrhea.       When he does not take medications.   Genitourinary: Negative for dysuria, frequency and urgency.  Musculoskeletal: Positive for back pain.       Wife had a few falls and he had to pick up.   Skin: Negative.   Neurological: Negative for dizziness, seizures and headaches.  Psychiatric/Behavioral: Negative for depression, memory loss and suicidal ideas. The patient does not have insomnia.    The following were reviewed and entered/updated in epic: Past Medical History:  Diagnosis Date  . Anxiety   . BPH (benign prostatic hypertrophy)   . CAD (coronary artery disease)    s/p CABG  . Diverticulosis   . Diverticulosis of colon without hemorrhage 10/22/2007   Qualifier: Diagnosis of  By: Linna Darner MD, [name]    . DOE (dyspnea on exertion)   . Excess weight   . Fasting hyperglycemia   . Generalized weakness   . GERD (gastroesophageal reflux disease)   . Heart attack ([place]) 2001  . Heart failure    CHF due to ischemic CM  . History of kidney stones   . Hyperlipidemia   . Hypertension   . Ischemic cardiomyopathy   . Microscopic hematuria    [facility]  . 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 Chronic systolic heart failure ([place]) 12/09/2010    Priority: High  . Cardiomyopathy, ischemic 10/02/2009    Priority: High  . Automatic implantable cardioverter-defibrillator in situ 01/16/2009    Priority: High  . DM (diabetes mellitus) type II,  controlled, with peripheral vascular disorder ([place]) 09/14/2008    Priority: High  . CAD in native artery 09/25/2006    Priority: High  . Nephrolithiasis 03/24/2016    Priority: Medium  . Hyperlipidemia associated with type 2 diabetes mellitus ([place]) 04/22/2007    Priority: Medium  . Hypertension associated with diabetes ([place]) 09/25/2006    Priority: Medium  . Long term (current) use of anticoagulants 07/08/2017    Priority: Low  . Erectile dysfunction 01/19/2014    Priority: Low  . Basal cell cancer 09/29/2012    Priority: Low  . Solitary pulmonary nodule 07/12/2012    Priority: Low  . BPH without obstruction/lower urinary tract symptoms 10/22/2007    Priority: Low  . Osteoarthritis 09/25/2006    Priority: Low  . Hematuria 09/25/2006    Priority: Low  . Melena    Past Surgical History:  Procedure Laterality Date  . APPENDECTOMY    . BIOPSY  10/21/2018   Procedure: BIOPSY;  Surgeon: Yetta Flock, MD;  Location: [facility];  Service: Gastroenterology;;  . CARDIAC CATHETERIZATION  2000  . CARDIAC DEFIBRILLATOR PLACEMENT  2005   [employer]; s/p ICD gen change and RV lead revision April 2015  . CATARACT EXTRACTION  2008   bilateral with lens implant  . COLONOSCOPY  2004   Tics; [facility]  . CORONARY ARTERY BYPASS GRAFT  2000  1 vessel  . CYSTOSCOPY  06/2012   Dr [name]  . ESOPHAGOGASTRODUODENOSCOPY (EGD) WITH PROPOFOL N/A 10/21/2018   Procedure: ESOPHAGOGASTRODUODENOSCOPY (EGD) WITH PROPOFOL;  Surgeon: Yetta Flock, MD;  Location: [facility];  Service: Gastroenterology;  Laterality: N/A;  . HOT HEMOSTASIS N/A 10/21/2018   Procedure: HOT HEMOSTASIS (ARGON PLASMA COAGULATION/BICAP);  Surgeon: Yetta Flock, MD;  Location: [facility];  Service: Gastroenterology;  Laterality: N/A;  . IMPLANTABLE CARDIOVERTER DEFIBRILLATOR (ICD) GENERATOR CHANGE N/A 06/01/2013   Procedure: ICD GENERATOR CHANGE;  Surgeon: [name], MD;  Location: [facility] CATH LAB;   Service: Cardiovascular;  Laterality: N/A;  . LEAD REVISION N/A 06/01/2013   Procedure: LEAD REVISION;  Surgeon: [name], MD;  Location: [facility] CATH LAB;  Service: Cardiovascular;  Laterality: N/A;  . PILONIDAL CYST EXCISION    . RIGHT/LEFT HEART CATH AND CORONARY/GRAFT ANGIOGRAPHY N/A 06/12/2017   Procedure: RIGHT/LEFT HEART CATH AND CORONARY/GRAFT ANGIOGRAPHY;  Surgeon: [name], MD;  Location: [facility] LAB;  Service: Cardiovascular;  Laterality: N/A;  . TRANSTHORACIC ECHOCARDIOGRAM  08/29/2005    Family History  Problem Relation Age of Onset  . Hypertension Father   . Heart attack Father 42  . Diabetes Brother        X 2  . Nephrolithiasis Brother   . Heart attack Brother 6  . Heart attack Paternal Uncle 6  . Sudden death Paternal Grandfather 33       ? heat stroke  . Lung cancer Sister        "Asian lung cancer"  . Stroke Neg Hx   . Colon cancer Neg Hx   . Esophageal cancer Neg Hx   . Rectal cancer Neg Hx   . Stomach cancer Neg Hx     Medications- reviewed and updated Current Outpatient Medications  Medication Sig Dispense Refill  . acetaminophen (TYLENOL) 500 MG tablet Take 500 mg by mouth at bedtime.     Marland Kitchen aspirin 81 MG tablet Take 1 tablet (81 mg total) by mouth every evening.    . carvedilol (COREG) 25 MG tablet Take 0.5 tablets (12.5 mg total) by mouth 2 (two) times daily. 90 tablet 3  . Cholecalciferol (VITAMIN D3) 2000 UNITS TABS Take 2,000 Units by mouth daily.     . folic acid (FOLVITE) 096 MCG tablet Take 400 mcg by mouth daily.      . furosemide (LASIX) 40 MG tablet Take 40 mg by mouth daily as needed.    . Glucosamine HCl 1500 MG TABS Take 1,500 mg by mouth every evening.     Marland Kitchen losartan (COZAAR) 50 MG tablet TAKE 1 TABLET BY MOUTH DAILY. GENERIC EQUIVALENT FOR COZAAR 90 tablet 3  . Multiple Vitamin (MULTIVITAMIN WITH MINERALS) TABS Take 1 tablet by mouth daily.     . pantoprazole (PROTONIX) 40 MG tablet Take 1 tablet (40 mg total) by mouth  daily. 90 tablet 3  . potassium chloride SA (KLOR-CON) 20 MEQ tablet TAKE 1 TABLET BY MOUTH DAILY, GENERIC EQUIVALENT FOR KLOR-CON. 90 tablet 2  . simvastatin (ZOCOR) 40 MG tablet TAKE ONE-HALF TABLET BY MOUTH AT BEDTIME 45 tablet 3  . spironolactone (ALDACTONE) 25 MG tablet Take 12.5 mg by mouth daily as needed.    Marland Kitchen tiZANidine (ZANAFLEX) 2 MG tablet Take 1 tablet (2 mg total) by mouth daily as needed for muscle spasms. 90 tablet 0  . warfarin (COUMADIN) 5 MG tablet Take 1/2 tablet daily except take 1 tablet on Sunday and Tuesday or  Take as directed by anticoagulation clinic  90 day 90 tablet 3   No current facility-administered medications for this visit.    Allergies-reviewed and updated Allergies  Allergen Reactions  . Penicillins Rash and Other (See Comments)    Because of a history of documented adverse serious drug reaction;Medi Alert bracelet  is recommended Has patient had a PCN reaction causing immediate rash, facial/tongue/throat swelling, SOB or lightheadedness with hypotension: No Has patient had a PCN reaction causing severe rash involving mucus membranes or skin necrosis: No Has patient had a PCN reaction that required hospitalization: No Has patient had a PCN reaction occurring within the last 10 years: No If all of the above answers are "NO", t    Social History   Social History Narrative   Married 1957. 4 children 2 boys 2 girls. 15 grandkids.  4 greatgrandkids.       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       Hobbies: genealogy, travel      No HCPOA-advised to do this.    Objective  Objective:  BP 120/74   Pulse 80   Temp 98.1 F (36.7 C) (Temporal)   Ht 5\' 11"  (1.803 m)   Wt 239 lb (108.4 kg)   SpO2 93%   BMI 33.33 kg/m  Gen: NAD, resting comfortably HEENT: Mucous membranes are moist. Oropharynx normal Neck: no thyromegaly CV: RRR no murmurs rubs or gallops Lungs: CTAB no crackles, wheeze,  rhonchi Abdomen: soft/nontender/nondistended/normal bowel sounds. No rebound or guarding.  Ext: trace edema Skin: warm, dry Neuro: grossly normal, moves all extremities, PERRLA  Diabetic Foot Exam - Simple   Simple Foot Form Diabetic Foot exam was performed with the following findings: Yes 08/26/2019  1:24 PM  Visual Inspection No deformities, no ulcerations, no other skin breakdown bilaterally: Yes Sensation Testing Intact to touch and monofilament testing bilaterally: Yes Pulse Check Posterior Tibialis and Dorsalis pulse intact bilaterally: Yes Comments      Assessment and Plan  86 y.o. male presenting for annual physical.  Health Maintenance counseling: 1. Anticipatory guidance: Patient counseled regarding regular dental exams q6 months, eye exams yearly,  avoiding smoking and second hand smoke , limiting alcohol to 2 beverages per day . Never drinks   2. Risk factor reduction:  Advised patient of need for regular exercise and diet rich and fruits and vegetables to reduce risk of heart attack and stroke. Exercise- does not exercise but has been doing yard work. Cardiology has encouraged him to do some walking- going to [employer] some.   Diet-tries to follow diabetic diet.  Down 3 pounds from May-congratulated on progress  Wt Readings from Last 3 Encounters:  08/26/19 239 lb (108.4 kg)  06/20/19 242 lb 4 oz (109.9 kg)  05/10/19 241 lb 6.1 oz (109.5 kg)  3. Immunizations/screenings/ancillary studies-up-to-date other than Shingrix  Immunization History  Administered Date(s) Administered  . Influenza, High Dose Seasonal PF 10/12/2018  . Influenza,inj,Quad PF,6+ Mos 11/11/2012  . Influenza-Unspecified 10/25/2013, 10/27/2014, 10/24/2015, 10/19/2017  . PFIZER SARS-COV-2 Vaccination 03/06/2019, 03/24/2019  . Pneumococcal Conjugate-13 08/21/2014  . Pneumococcal Polysaccharide-23 10/02/2009  . Td 09/17/2009  . Zoster 12/29/2012   4. Prostate cancer screening- past age based screening  recommendations Lab Results  Component Value Date   PSA 0.58 10/22/2007   PSA 0.67 09/25/2006   5. Colon cancer screening - Aged out . Did have workup 10/2018 for melena.  6. Skin cancer screening- followed by dermatology seen today.  advised regular sunscreen use. Denies worrisome, changing, or new skin lesions.  7. never smoker 8. STD screening - monogomous  Status of chronic or acute concerns   #Heart failure with reduced ejection fraction/ ischemic cardiomypopathy S: Medication:Carvedilol 12.5 mg twice daily, Lasix 40 mg as needed (takes calcium with Lasix), losartan 50 mg daily, spironolactone 12.5 mg as needed. Not having to use diuretics lately.   Edema: stable Weight gain: has actually had loss Shortness of breath: mild stable Orthopnea/PND: denies A/P:  Appears stable- continue meds and cardiology follow up with Dr. Sharlotte Alamo   #CAD with defibrillator in place/hyperlipidemia- no increase in chest pain from baseline, stable SOB- cardiology aware #Apical thrombus-on Coumadin through cardiology clinic started around 2020 S: Medication:Aspirin 81 mg, simvastatin 40 mg Lab Results  Component Value Date   CHOL 154 01/25/2019   HDL 40.70 01/25/2019   [place] 81 01/25/2019   LDLDIRECT 77.0 11/17/2017   TRIG 158.0 (H) 01/25/2019   CHOLHDL 4 01/25/2019   A/P: Coronary artery disease appears stable.  Continue aspirin. Does have some weakness as he is getting older- offered PT.    For his cholesterol he is on simvastatin-this has been the max tolerable dose-LDL slightly high in the 80s-recheck lipid panel today-can focus on healthy eating/regular exercise-he does report slight weight loss.  for apical runs of with good thrombus-remain on Coumadin  #hypertension S: medication: Carvedilol 12.5 mg twice daily, losartan 50 mg.  Lasix and spironolactone as needed Home readings #s: home readings have all been good per patient.  A/P: Stable. Continue current medications.   #  Diabetes S: Medication:Diet controlled CBGs- Home readings have been between 120-150 fasting  Lab Results  Component Value Date   HGBA1C 7.4 (H) 01/25/2019  A/P: reasonable control- tolerate a1c under 7.5 or 8- update today  #Melena history-history of gastric erosions-on omeprazole for 3 months in 2020. High risk with coumadin and aspirin    #Lower back pain- intermittent issues and has been trying copper fit belt and that seems to help some. No midline pain. Stable incontinence issues. No weakness in legs from back pain (states does have some issues getting up and down in gneral) - he is going to think it over and consider visit with Dr. Tamala Julian.   Recommended follow up: Return in about 6 months (around 02/26/2020) for follow up- or sooner if needed. Future Appointments  Date Time Provider [place]  08/29/2019  1:00 PM [facility] HEALTH COACH [facility]  09/21/2019 10:30 AM [facility] COUMADIN CLINIC [facility]  09/28/2019  8:00 AM [facility] DEVICE REMOTES [facility]  12/28/2019  8:00 AM [facility] DEVICE REMOTES [facility]   Lab/Order associations: not fasting- had yogurt   ICD-10-CM   1. Preventative health care  Z00.00 CBC with Differential/Platelet    Comprehensive metabolic panel    Lipid panel    Hemoglobin A1c  2. DM (diabetes mellitus) type II, controlled, with peripheral vascular disorder (HCC)  E11.51 CBC with Differential/Platelet    Comprehensive metabolic panel    Lipid panel    Hemoglobin A1c  3. Hyperlipidemia associated with type 2 diabetes mellitus ([place])  E11.69    E78.5   4. Hypertension associated with diabetes ([place])  E11.59    I10   5. Chronic systolic heart failure (HCC)  I50.22   6. Cardiomyopathy, ischemic  I25.5   7. CAD in native artery  I25.10   8. Automatic implantable cardioverter-defibrillator in situ  Z95.810     No orders of the defined  types were placed in this encounter.   Return precautions advised.   Garret Reddish, MD

## 2019-08-29 ENCOUNTER — Ambulatory Visit (INDEPENDENT_AMBULATORY_CARE_PROVIDER_SITE_OTHER): Payer: Medicare Other

## 2019-08-29 DIAGNOSIS — Z0000 Encounter for general adult medical examination without abnormal findings: Secondary | ICD-10-CM

## 2019-08-29 NOTE — Patient Instructions (Addendum)
Joseph Malone , Thank you for taking time to come for your Medicare Wellness Visit. I appreciate your ongoing commitment to your health goals. Please review the following plan we discussed and let me know if I can assist you in the future.   Screening recommendations/referrals: Colonoscopy: No longer required Recommended yearly ophthalmology/optometry visit for glaucoma screening and checkup Recommended yearly dental visit for hygiene and checkup  Vaccinations: Influenza vaccine: Up to date Pneumococcal vaccine: Up to date Tdap vaccine: Due Information discussed Shingles vaccine: Shingrix discussed. Please contact your pharmacy for coverage information.    Covid-19: Completed 1/24 & 03/24/19  Advanced directives: Advance directive discussed with you today. I have provided a copy for you to complete at home and have notarized. Once this is complete please bring a copy in to our office so we can scan it into your chart.   Conditions/risks identified: Stay healthy   Next appointment: Follow up in one year for your annual wellness visit.   Preventive Care 12 Years and Older, Male Preventive care refers to lifestyle choices and visits with your health care provider that can promote health and wellness. What does preventive care include?  A yearly physical exam. This is also called an annual well check.  Dental exams once or twice a year.  Routine eye exams. Ask your health care provider how often you should have your eyes checked.  Personal lifestyle choices, including:  Daily care of your teeth and gums.  Regular physical activity.  Eating a healthy diet.  Avoiding tobacco and drug use.  Limiting alcohol use.  Practicing safe sex.  Taking low doses of aspirin every day.  Taking vitamin and mineral supplements as recommended by your health care provider. What happens during an annual well check? The services and screenings done by your health care provider during your annual  well check will depend on your age, overall health, lifestyle risk factors, and family history of disease. Counseling  Your health care provider may ask you questions about your:  Alcohol use.  Tobacco use.  Drug use.  Emotional well-being.  Home and relationship well-being.  Sexual activity.  Eating habits.  History of falls.  Memory and ability to understand (cognition).  Work and work Statistician. Screening  You may have the following tests or measurements:  Height, weight, and BMI.  Blood pressure.  Lipid and cholesterol levels. These may be checked every 5 years, or more frequently if you are over 72 years old.  Skin check.  Lung cancer screening. You may have this screening every year starting at age 64 if you have a 30-pack-year history of smoking and currently smoke or have quit within the past 15 years.  Fecal occult blood test (FOBT) of the stool. You may have this test every year starting at age 1.  Flexible sigmoidoscopy or colonoscopy. You may have a sigmoidoscopy every 5 years or a colonoscopy every 10 years starting at age 17.  Prostate cancer screening. Recommendations will vary depending on your family history and other risks.  Hepatitis C blood test.  Hepatitis B blood test.  Sexually transmitted disease (STD) testing.  Diabetes screening. This is done by checking your blood sugar (glucose) after you have not eaten for a while (fasting). You may have this done every 1-3 years.  Abdominal aortic aneurysm (AAA) screening. You may need this if you are a current or former smoker.  Osteoporosis. You may be screened starting at age 56 if you are at high risk. Talk with  your health care provider about your test results, treatment options, and if necessary, the need for more tests. Vaccines  Your health care provider may recommend certain vaccines, such as:  Influenza vaccine. This is recommended every year.  Tetanus, diphtheria, and acellular  pertussis (Tdap, Td) vaccine. You may need a Td booster every 10 years.  Zoster vaccine. You may need this after age 54.  Pneumococcal 13-valent conjugate (PCV13) vaccine. One dose is recommended after age 7.  Pneumococcal polysaccharide (PPSV23) vaccine. One dose is recommended after age 34. Talk to your health care provider about which screenings and vaccines you need and how often you need them. This information is not intended to replace advice given to you by your health care provider. Make sure you discuss any questions you have with your health care provider. Document Released: 02/23/2015 Document Revised: 10/17/2015 Document Reviewed: 11/28/2014 Elsevier Interactive Patient Education  2017 [place] Prevention in the Home Falls can cause injuries. They can happen to people of all ages. There are many things you can do to make your home safe and to help prevent falls. What can I do on the outside of my home?  Regularly fix the edges of walkways and driveways and fix any cracks.  Remove anything that might make you trip as you walk through a door, such as a raised step or threshold.  Trim any bushes or trees on the path to your home.  Use bright outdoor lighting.  Clear any walking paths of anything that might make someone trip, such as rocks or tools.  Regularly check to see if handrails are loose or broken. Make sure that both sides of any steps have handrails.  Any raised decks and porches should have guardrails on the edges.  Have any leaves, snow, or ice cleared regularly.  Use sand or salt on walking paths during winter.  Clean up any spills in your garage right away. This includes oil or grease spills. What can I do in the bathroom?  Use night lights.  Install grab bars by the toilet and in the tub and shower. Do not use towel bars as grab bars.  Use non-skid mats or decals in the tub or shower.  If you need to sit down in the shower, use a plastic,  non-slip stool.  Keep the floor dry. Clean up any water that spills on the floor as soon as it happens.  Remove soap buildup in the tub or shower regularly.  Attach bath mats securely with double-sided non-slip rug tape.  Do not have throw rugs and other things on the floor that can make you trip. What can I do in the bedroom?  Use night lights.  Make sure that you have a light by your bed that is easy to reach.  Do not use any sheets or blankets that are too big for your bed. They should not hang down onto the floor.  Have a firm chair that has side arms. You can use this for support while you get dressed.  Do not have throw rugs and other things on the floor that can make you trip. What can I do in the kitchen?  Clean up any spills right away.  Avoid walking on wet floors.  Keep items that you use a lot in easy-to-reach places.  If you need to reach something above you, use a strong step stool that has a grab bar.  Keep electrical cords out of the way.  Do not  use floor polish or wax that makes floors slippery. If you must use wax, use non-skid floor wax.  Do not have throw rugs and other things on the floor that can make you trip. What can I do with my stairs?  Do not leave any items on the stairs.  Make sure that there are handrails on both sides of the stairs and use them. Fix handrails that are broken or loose. Make sure that handrails are as long as the stairways.  Check any carpeting to make sure that it is firmly attached to the stairs. Fix any carpet that is loose or worn.  Avoid having throw rugs at the top or bottom of the stairs. If you do have throw rugs, attach them to the floor with carpet tape.  Make sure that you have a light switch at the top of the stairs and the bottom of the stairs. If you do not have them, ask someone to add them for you. What else can I do to help prevent falls?  Wear shoes that:  Do not have high heels.  Have rubber  bottoms.  Are comfortable and fit you well.  Are closed at the toe. Do not wear sandals.  If you use a stepladder:  Make sure that it is fully opened. Do not climb a closed stepladder.  Make sure that both sides of the stepladder are locked into place.  Ask someone to hold it for you, if possible.  Clearly mark and make sure that you can see:  Any grab bars or handrails.  First and last steps.  Where the edge of each step is.  Use tools that help you move around (mobility aids) if they are needed. These include:  Canes.  Walkers.  Scooters.  Crutches.  Turn on the lights when you go into a dark area. Replace any light bulbs as soon as they burn out.  Set up your furniture so you have a clear path. Avoid moving your furniture around.  If any of your floors are uneven, fix them.  If there are any pets around you, be aware of where they are.  Review your medicines with your doctor. Some medicines can make you feel dizzy. This can increase your chance of falling. Ask your doctor what other things that you can do to help prevent falls. This information is not intended to replace advice given to you by your health care provider. Make sure you discuss any questions you have with your health care provider. Document Released: 11/23/2008 Document Revised: 07/05/2015 Document Reviewed: 03/03/2014 Elsevier Interactive Patient Education  2017 [employer].

## 2019-08-29 NOTE — Progress Notes (Signed)
Virtual Visit via Telephone Note  I connected with  Joseph Malone on 08/29/19 at  1:00 PM EDT by telephone and verified that I am speaking with the correct person using two identifiers.  Medicare Annual Wellness visit completed telephonically due to Covid-19 pandemic.   Persons participating in this call: This Health Coach and this patient.   Location: Patient: Home Provider: Office   I discussed the limitations, risks, security and privacy concerns of performing an evaluation and management service by telephone and the availability of in person appointments. The patient expressed understanding and agreed to proceed.  Unable to perform video visit due to video visit attempted and failed and/or patient does not have video capability.   Some vital signs may be absent or patient reported.   Willette Brace, LPN    Subjective:   Joseph Malone is a 86 y.o. male who presents for Medicare Annual/Subsequent preventive examination.  Review of Systems     Cardiac Risk Factors include: male gender;diabetes mellitus;dyslipidemia;hypertension;obesity (BMI >30kg/m2)     Objective:    There were no vitals filed for this visit. There is no height or weight on file to calculate BMI.  Advanced Directives 08/29/2019 10/21/2018 10/01/2018 06/12/2017 06/01/2013  Does Patient Have a Medical Advance Directive? Yes No No Yes Patient does not have advance directive;Patient would like information  Type of Advance Directive - - - [place];Living will Living will  Does patient want to make changes to medical advance directive? Yes (MAU/Ambulatory/Procedural Areas - Information given) - - No - Patient declined -  Copy of [place] in Chart? - - - No - copy requested -  Would patient like information on creating a medical advance directive? - No - Patient declined - - Advance directive packet given    Current Medications (verified) Outpatient Encounter Medications as  of 08/29/2019  Medication Sig  . acetaminophen (TYLENOL) 500 MG tablet Take 500 mg by mouth at bedtime.   Marland Kitchen aspirin 81 MG tablet Take 1 tablet (81 mg total) by mouth every evening.  . carvedilol (COREG) 25 MG tablet Take 0.5 tablets (12.5 mg total) by mouth 2 (two) times daily.  . Cholecalciferol (VITAMIN D3) 2000 UNITS TABS Take 2,000 Units by mouth daily.   . folic acid (FOLVITE) 734 MCG tablet Take 400 mcg by mouth daily.    . furosemide (LASIX) 40 MG tablet Take 40 mg by mouth daily as needed.  . Glucosamine HCl 1500 MG TABS Take 1,500 mg by mouth every evening.   Marland Kitchen losartan (COZAAR) 50 MG tablet TAKE 1 TABLET BY MOUTH DAILY. GENERIC EQUIVALENT FOR COZAAR  . Multiple Vitamin (MULTIVITAMIN WITH MINERALS) TABS Take 1 tablet by mouth daily.   . pantoprazole (PROTONIX) 40 MG tablet Take 1 tablet (40 mg total) by mouth daily.  . potassium chloride SA (KLOR-CON) 20 MEQ tablet TAKE 1 TABLET BY MOUTH DAILY, GENERIC EQUIVALENT FOR KLOR-CON.  . simvastatin (ZOCOR) 40 MG tablet TAKE ONE-HALF TABLET BY MOUTH AT BEDTIME  . spironolactone (ALDACTONE) 25 MG tablet Take 12.5 mg by mouth daily as needed.  Marland Kitchen tiZANidine (ZANAFLEX) 2 MG tablet Take 1 tablet (2 mg total) by mouth daily as needed for muscle spasms.  Marland Kitchen warfarin (COUMADIN) 5 MG tablet Take 1/2 tablet daily except take 1 tablet on Sunday and Tuesday or Take as directed by anticoagulation clinic  90 day   No facility-administered encounter medications on file as of 08/29/2019.    Allergies (verified) Penicillins  History: Past Medical History:  Diagnosis Date  . Anxiety   . BPH (benign prostatic hypertrophy)   . CAD (coronary artery disease)    s/p CABG  . Diverticulosis   . Diverticulosis of colon without hemorrhage 10/22/2007   Qualifier: Diagnosis of  By: Linna Darner MD, [name]    . DOE (dyspnea on exertion)   . Excess weight   . Fasting hyperglycemia   . Generalized weakness   . GERD (gastroesophageal reflux disease)   . Heart attack  ([place]) 2001  . Heart failure    CHF due to ischemic CM  . History of kidney stones   . Hyperlipidemia   . Hypertension   . Ischemic cardiomyopathy   . Microscopic hematuria    [facility]  . 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 APPENDECTOMY    . BIOPSY  10/21/2018   Procedure: BIOPSY;  Surgeon: Yetta Flock, MD;  Location: [facility];  Service: Gastroenterology;;  . CARDIAC CATHETERIZATION  2000  . CARDIAC DEFIBRILLATOR PLACEMENT  2005   [employer]; s/p ICD gen change and RV lead revision April 2015  . CATARACT EXTRACTION  2008   bilateral with lens implant  . COLONOSCOPY  2004   Tics; [facility]  . CORONARY ARTERY BYPASS GRAFT  2000   1 vessel  . CYSTOSCOPY  06/2012   Dr [name]  . ESOPHAGOGASTRODUODENOSCOPY (EGD) WITH PROPOFOL N/A 10/21/2018   Procedure: ESOPHAGOGASTRODUODENOSCOPY (EGD) WITH PROPOFOL;  Surgeon: Yetta Flock, MD;  Location: [facility];  Service: Gastroenterology;  Laterality: N/A;  . HOT HEMOSTASIS N/A 10/21/2018   Procedure: HOT HEMOSTASIS (ARGON PLASMA COAGULATION/BICAP);  Surgeon: Yetta Flock, MD;  Location: [facility];  Service: Gastroenterology;  Laterality: N/A;  . IMPLANTABLE CARDIOVERTER DEFIBRILLATOR (ICD) GENERATOR CHANGE N/A 06/01/2013   Procedure: ICD GENERATOR CHANGE;  Surgeon: [name], MD;  Location: [facility] CATH LAB;  Service: Cardiovascular;  Laterality: N/A;  . LEAD REVISION N/A 06/01/2013   Procedure: LEAD REVISION;  Surgeon: [name], MD;  Location: [facility] CATH LAB;  Service: Cardiovascular;  Laterality: N/A;  . PILONIDAL CYST EXCISION    . RIGHT/LEFT HEART CATH AND CORONARY/GRAFT ANGIOGRAPHY N/A 06/12/2017   Procedure: RIGHT/LEFT HEART CATH AND CORONARY/GRAFT ANGIOGRAPHY;  Surgeon: [name], MD;  Location: [facility];  Service: Cardiovascular;  Laterality: N/A;  . TRANSTHORACIC ECHOCARDIOGRAM  08/29/2005   Family History   Problem Relation Age of Onset  . Hypertension Father   . Heart attack Father 66  . Diabetes Brother        X 2  . Nephrolithiasis Brother   . Heart attack Brother 49  . Heart attack Paternal Uncle 63  . Sudden death Paternal Grandfather 54       ? heat stroke  . Lung cancer Sister        "Asian lung cancer"  . Stroke Neg Hx   . Colon cancer Neg Hx   . Esophageal cancer Neg Hx   . Rectal cancer Neg Hx   . Stomach cancer Neg Hx    Social History   Socioeconomic History  . Marital status: Married    Spouse name: Not on file  . Number of children: Not on file  . Years of education: Not on file  . Highest education level: Not on file  Occupational History  . Occupation: Retired  Tobacco Use  . Smoking status: Passive Smoke Exposure - Never Smoker  .  Smokeless tobacco: Never Used  Vaping Use  . Vaping Use: Never used  Substance and Sexual Activity  . Alcohol use: No  . Drug use: No  . Sexual activity: Never  Other Topics Concern  . Not on file  Social History Narrative   Married 1957. 4 children 2 boys 2 girls. 15 grandkids.  4 greatgrandkids.       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 Retired from [employer] and worked 30 years.       Hobbies: genealogy, travel      No HCPOA-advised to do this.    Social Determinants of Health   Financial Resource Strain: Low Risk   . Difficulty of Paying Living Expenses: Not hard at all  Food Insecurity: No Food Insecurity  . Worried About Charity fundraiser in the Last Year: Never true  . Ran Out of Food in the Last Year: Never true  Transportation Needs: No Transportation Needs  . Lack of Transportation (Medical): No  . Lack of Transportation (Non-Medical): No  Physical Activity: Insufficiently Active  . Days of Exercise per Week: 3 days  . Minutes of Exercise per Session: 30 min  Stress: No Stress Concern Present  . Feeling of Stress : Only a little  Social Connections: Socially Integrated  . Frequency of  Communication with Friends and Family: More than three times a week  . Frequency of Social Gatherings with Friends and Family: More than three times a week  . Attends Religious Services: More than 4 times per year  . Active Member of Clubs or Organizations: Yes  . Attends Archivist Meetings: More than 4 times per year  . Marital Status: Married    Tobacco Counseling Counseling given: Not Answered   Clinical Intake:  Pre-visit preparation completed: Yes  Pain : No/denies pain     BMI - recorded: 33.68 Nutritional Status: BMI > 30  Obese Diabetes: Yes CBG done?: No Did pt. bring in CBG monitor from home?: No  How often do you need to have someone help you when you read instructions, pamphlets, or other written materials from your doctor or pharmacy?: 1 - Never  Diabetic?Yes  Interpreter Needed?: No  Information entered by :: Charlott Rakes, LPN   Activities of Daily Living In your present state of health, do you have any difficulty performing the following activities: 08/29/2019 01/25/2019  Hearing? Tempie Donning  Comment states he is going to get a hearing exam for hearing aids increased hearing issue has app to have tested.  Vision? N N  Difficulty concentrating or making decisions? N N  Walking or climbing stairs? Tempie Donning  Comment get short winded increased trouble with walking up stairs for two years  Dressing or bathing? Y N  Comment Short of breath with showering -  Doing errands, shopping? N N  Preparing Food and eating ? N -  Using the Toilet? N -  In the past six months, have you accidently leaked urine? Y -  Comment leakage at times -  Do you have problems with loss of bowel control? Y -  Comment no blood since procedure at [facility] -  Managing your Medications? N -  Managing your Finances? N -  Housekeeping or managing your Housekeeping? N -  Some recent data might be hidden    Patient Care Team: Marin Olp, MD as PCP - General (Family  Medicine) [name], MD as PCP - Electrophysiology (Cardiology) Bensimhon, Shaune Pascal, MD as PCP -  Advanced Heart Failure (Cardiology) Festus Aloe, MD as Consulting Physician (Urology) Allyn Kenner, MD (Dermatology)  Indicate any recent Medical Services you may have received from other than [facility] providers in the past year (date may be approximate).     Assessment:   This is a routine wellness examination for [place].  Hearing/Vision screen  Hearing Screening   125Hz  250Hz  500Hz  1000Hz  2000Hz  3000Hz  4000Hz  6000Hz  8000Hz   Right ear:           Left ear:           Comments: Pt states he has difficulty hearing and will be examined for hearing aids  Vision Screening Comments: [facility]s up [facility] and wears readers  Dietary issues and exercise activities discussed: Current Exercise Habits: Structured exercise class (goes to the [facility] and walk in [place]), Type of exercise: walking, Time (Minutes): 30, Frequency (Times/Week): 3, Weekly Exercise (Minutes/Week): 90, Exercise limited by: respiratory conditions(s)  Goals    . Patient Stated     Stay healthy and stay in good shape      Depression Screen PHQ 2/9 Scores 08/29/2019 08/26/2019 08/23/2018 11/17/2017 05/12/2017 05/12/2017 01/18/2016  PHQ - 2 Score 2 3 0 1 2 2  0  PHQ- 9 Score 4 7 3 6 8 8  -    Fall Risk Fall Risk  08/29/2019 08/26/2019 01/25/2019 08/23/2018 05/12/2017  Falls in the past year? 0 0 0 0 No  Number falls in past yr: 0 0 0 0 -  Injury with Fall? 0 0 0 0 -  Risk for fall due to : Impaired mobility;Impaired balance/gait - - - -  Follow up Falls prevention discussed - - - -    Any stairs in or around the home? Yes  If so, are there any without handrails? No Home free of loose throw rugs in walkways, pet beds, electrical cords, etc? Yes  Adequate lighting in your home to reduce risk of falls? Yes   ASSISTIVE DEVICES UTILIZED TO PREVENT FALLS:  Life alert? No  Use of a cane, walker or w/c? No  Grab bars in  the bathroom? Yes  Shower chair or bench in shower? Yes  Elevated toilet seat or a handicapped toilet? Yes   TIMED UP AND GO:  Was the test performed? No .    Cognitive Function:     6CIT Screen 08/29/2019  What Year? 0 points  What month? 0 points  What time? 0 points  Count back from 20 0 points  Months in reverse 0 points  Repeat phrase 4 points  Total Score 4    Immunizations Immunization History  Administered Date(s) Administered  . Influenza, High Dose Seasonal PF 10/12/2018  . Influenza,inj,Quad PF,6+ Mos 11/11/2012  . Influenza-Unspecified 10/25/2013, 10/27/2014, 10/24/2015, 10/19/2017  . PFIZER SARS-COV-2 Vaccination 03/06/2019, 03/24/2019  . Pneumococcal Conjugate-13 08/21/2014  . Pneumococcal Polysaccharide-23 10/02/2009  . Td 09/17/2009  . Zoster 12/29/2012    TDAP status: Due, Education has been provided regarding the importance of this vaccine. Advised may receive this vaccine at local pharmacy or Health Dept. Aware to provide a copy of the vaccination record if obtained from local pharmacy or Health Dept. Verbalized acceptance and understanding. Flu Vaccine status: Up to date Pneumococcal vaccine status: Up to date Covid-19 vaccine status: Completed vaccines  Qualifies for Shingles Vaccine? Yes   Zostavax completed Yes   Shingrix Completed?: No.    Education has been provided regarding the importance of this vaccine. Patient has been advised to call  insurance company to determine out of pocket expense if they have not yet received this vaccine. Advised may also receive vaccine at local pharmacy or Health Dept. Verbalized acceptance and understanding.  Screening Tests Health Maintenance  Topic Date Due  . INFLUENZA VACCINE  09/11/2019  . TETANUS/TDAP  09/18/2019  . OPHTHALMOLOGY EXAM  10/01/2019  . HEMOGLOBIN A1C  02/26/2020  . FOOT EXAM  08/25/2020  . COVID-19 Vaccine  Completed  . PNA vac Low Risk Adult  Completed    Health Maintenance  There  are no preventive care reminders to display for this patient.  Colorectal cancer screening: No longer required.     Additional Screening:   Vision Screening: Recommended annual ophthalmology exams for early detection of glaucoma and other disorders of the eye. Is the patient up to date with their annual eye exam?  Yes  Who is the provider or what is the name of the office in which the patient attends annual eye exams? [facility]   Dental Screening: Recommended annual dental exams for proper oral hygiene  Community Resource Referral / Chronic Care Management: CRR required this visit?  No   CCM required this visit?  No      Plan:     I have personally reviewed and noted the following in the patient's chart:   . Medical and social history . Use of alcohol, tobacco or illicit drugs  . Current medications and supplements . Functional ability and status . Nutritional status . Physical activity . Advanced directives . List of other physicians . Hospitalizations, surgeries, and ER visits in previous 12 months . Vitals . Screenings to include cognitive, depression, and falls . Referrals and appointments  In addition, I have reviewed and discussed with patient certain preventive protocols, quality metrics, and best practice recommendations. A written personalized care plan for preventive services as well as general preventive health recommendations were provided to patient.     Willette Brace, LPN   4/80/1655   Nurse Notes: None

## 2019-09-15 ENCOUNTER — Other Ambulatory Visit (HOSPITAL_COMMUNITY): Payer: Self-pay | Admitting: Surgery

## 2019-09-15 MED ORDER — WARFARIN SODIUM 5 MG PO TABS: ORAL_TABLET | ORAL | 3 refills | Status: DC

## 2019-09-21 ENCOUNTER — Other Ambulatory Visit: Payer: Self-pay

## 2019-09-21 ENCOUNTER — Ambulatory Visit (INDEPENDENT_AMBULATORY_CARE_PROVIDER_SITE_OTHER): Payer: Medicare Other | Admitting: General Practice

## 2019-09-21 DIAGNOSIS — Z7901 Long term (current) use of anticoagulants: Secondary | ICD-10-CM

## 2019-09-21 LAB — POCT INR: INR: 2 (ref 2.0–3.0)

## 2019-09-21 NOTE — Patient Instructions (Addendum)
Pre visit review using our clinic review tool, if applicable. No additional management support is needed unless otherwise documented below in the visit note.  Continue to take 1/2 tablet daily except 1 tablet on Sunday and Thursdays.  Re-check in 6 weeks.

## 2019-09-21 NOTE — Progress Notes (Signed)
I have reviewed and agree with note, evaluation, plan.   Meral Geissinger, MD  

## 2019-09-28 ENCOUNTER — Ambulatory Visit (INDEPENDENT_AMBULATORY_CARE_PROVIDER_SITE_OTHER): Payer: Medicare Other | Admitting: *Deleted

## 2019-09-28 DIAGNOSIS — I255 Ischemic cardiomyopathy: Secondary | ICD-10-CM | POA: Diagnosis not present

## 2019-09-28 LAB — CUP PACEART REMOTE DEVICE CHECK
Battery Remaining Longevity: 67 mo
Battery Voltage: 2.99 V
Brady Statistic RV Percent Paced: 0.01 %
Date Time Interrogation Session: 20210818012505
HighPow Impedance: 74 Ohm
Implantable Lead Implant Date: 20150422
Implantable Lead Location: 753860
Implantable Lead Model: 6935
Implantable Pulse Generator Implant Date: 20150422
Lead Channel Impedance Value: 475 Ohm
Lead Channel Impedance Value: 532 Ohm
Lead Channel Pacing Threshold Amplitude: 0.5 V
Lead Channel Pacing Threshold Pulse Width: 0.4 ms
Lead Channel Sensing Intrinsic Amplitude: 12.625 mV
Lead Channel Sensing Intrinsic Amplitude: 12.625 mV
Lead Channel Setting Pacing Amplitude: 2 V
Lead Channel Setting Pacing Pulse Width: 0.4 ms
Lead Channel Setting Sensing Sensitivity: 0.3 mV

## 2019-09-30 NOTE — Progress Notes (Signed)
Remote ICD transmission.   

## 2019-11-09 ENCOUNTER — Ambulatory Visit: Payer: Medicare Other

## 2019-11-16 ENCOUNTER — Ambulatory Visit: Payer: Medicare Other

## 2019-11-16 DIAGNOSIS — H903 Sensorineural hearing loss, bilateral: Secondary | ICD-10-CM | POA: Diagnosis not present

## 2019-11-23 ENCOUNTER — Other Ambulatory Visit: Payer: Self-pay

## 2019-11-23 ENCOUNTER — Ambulatory Visit (INDEPENDENT_AMBULATORY_CARE_PROVIDER_SITE_OTHER): Payer: Medicare Other | Admitting: General Practice

## 2019-11-23 DIAGNOSIS — Z7901 Long term (current) use of anticoagulants: Secondary | ICD-10-CM

## 2019-11-23 LAB — POCT INR: INR: 3 (ref 2.0–3.0)

## 2019-11-23 NOTE — Patient Instructions (Addendum)
Pre visit review using our clinic review tool, if applicable. No additional management support is needed unless otherwise documented below in the visit note.  Change dosage and take 1/2 tablet daily except 1 tablet on Sundays.  Re-check in 6 weeks.

## 2019-11-23 NOTE — Progress Notes (Signed)
I have reviewed and agree with note, evaluation, plan.   Sabryn Preslar, MD  

## 2019-12-13 DIAGNOSIS — N200 Calculus of kidney: Secondary | ICD-10-CM | POA: Diagnosis not present

## 2019-12-13 DIAGNOSIS — R3129 Other microscopic hematuria: Secondary | ICD-10-CM | POA: Diagnosis not present

## 2019-12-13 DIAGNOSIS — R3915 Urgency of urination: Secondary | ICD-10-CM | POA: Diagnosis not present

## 2019-12-13 DIAGNOSIS — N401 Enlarged prostate with lower urinary tract symptoms: Secondary | ICD-10-CM | POA: Diagnosis not present

## 2019-12-28 ENCOUNTER — Ambulatory Visit (INDEPENDENT_AMBULATORY_CARE_PROVIDER_SITE_OTHER): Payer: Medicare Other | Admitting: General Practice

## 2019-12-28 ENCOUNTER — Other Ambulatory Visit: Payer: Self-pay

## 2019-12-28 ENCOUNTER — Ambulatory Visit (INDEPENDENT_AMBULATORY_CARE_PROVIDER_SITE_OTHER): Payer: Medicare Other

## 2019-12-28 DIAGNOSIS — Z7901 Long term (current) use of anticoagulants: Secondary | ICD-10-CM

## 2019-12-28 DIAGNOSIS — I255 Ischemic cardiomyopathy: Secondary | ICD-10-CM | POA: Diagnosis not present

## 2019-12-28 LAB — CUP PACEART REMOTE DEVICE CHECK
Battery Remaining Longevity: 66 mo
Battery Voltage: 2.98 V
Brady Statistic RV Percent Paced: 0.02 %
Date Time Interrogation Session: 20211117001604
HighPow Impedance: 75 Ohm
Implantable Lead Implant Date: 20150422
Implantable Lead Location: 753860
Implantable Lead Model: 6935
Implantable Pulse Generator Implant Date: 20150422
Lead Channel Impedance Value: 456 Ohm
Lead Channel Impedance Value: 513 Ohm
Lead Channel Pacing Threshold Amplitude: 0.625 V
Lead Channel Pacing Threshold Pulse Width: 0.4 ms
Lead Channel Sensing Intrinsic Amplitude: 13.875 mV
Lead Channel Sensing Intrinsic Amplitude: 13.875 mV
Lead Channel Setting Pacing Amplitude: 2 V
Lead Channel Setting Pacing Pulse Width: 0.4 ms
Lead Channel Setting Sensing Sensitivity: 0.3 mV

## 2019-12-28 LAB — POCT INR: INR: 1.9 — AB (ref 2.0–3.0)

## 2019-12-28 NOTE — Progress Notes (Signed)
I have reviewed and agree with note, evaluation, plan.   Estelene Carmack, MD  

## 2019-12-28 NOTE — Patient Instructions (Signed)
Pre visit review using our clinic review tool, if applicable. No additional management support is needed unless otherwise documented below in the visit note.  Take 1 tablet today and then change dosage and take 1/2 tablet daily except 1 tablet on Sundays and Wednesdays.  Re-check in 6 weeks.

## 2019-12-30 NOTE — Progress Notes (Signed)
Remote ICD transmission.   

## 2020-01-24 ENCOUNTER — Ambulatory Visit: Payer: Medicare Other | Admitting: Internal Medicine

## 2020-01-24 ENCOUNTER — Other Ambulatory Visit: Payer: Self-pay

## 2020-01-24 VITALS — BP 98/60 | HR 84 | Ht 71.0 in | Wt 234.0 lb

## 2020-01-24 DIAGNOSIS — I493 Ventricular premature depolarization: Secondary | ICD-10-CM

## 2020-01-24 DIAGNOSIS — Z95810 Presence of automatic (implantable) cardiac defibrillator: Secondary | ICD-10-CM | POA: Diagnosis not present

## 2020-01-24 DIAGNOSIS — I5022 Chronic systolic (congestive) heart failure: Secondary | ICD-10-CM

## 2020-01-24 MED ORDER — CARVEDILOL 6.25 MG PO TABS: 6.2500 mg | ORAL_TABLET | Freq: Two times a day (BID) | ORAL | 3 refills | Status: DC

## 2020-01-24 MED ORDER — LOSARTAN POTASSIUM 25 MG PO TABS: 25.0000 mg | ORAL_TABLET | Freq: Every day | ORAL | 3 refills | Status: DC

## 2020-01-24 NOTE — Progress Notes (Signed)
HPI Mr.Helfman returns today for followup. He is a pleasant 86 yo man with chronic systolic heart failure, s/p CABG, prior remote anterior MI, EF 35%. He is s/p ICD insertion for primary prevention. He c/o feeling weak. He has been under increased stress with his wife who has dementia. He c/o feeling weak and has had a lack of energy. His bp has been running in the 90's. No chest pain. No ICD therapies.  Allergies  Allergen Reactions   Penicillins Rash and Other (See Comments)    Because of a history of documented adverse serious drug reaction;Medi Alert bracelet  is recommended Has patient had a PCN reaction causing immediate rash, facial/tongue/throat swelling, SOB or lightheadedness with hypotension: No Has patient had a PCN reaction causing severe rash involving mucus membranes or skin necrosis: No Has patient had a PCN reaction that required hospitalization: No Has patient had a PCN reaction occurring within the last 10 years: No If all of the above answers are "NO", t     Current Outpatient Medications  Medication Sig Dispense Refill   acetaminophen (TYLENOL) 500 MG tablet Take 500 mg by mouth at bedtime.      aspirin 81 MG tablet Take 1 tablet (81 mg total) by mouth every evening.     Cholecalciferol (VITAMIN D3) 2000 UNITS TABS Take 2,000 Units by mouth daily.     folic acid (FOLVITE) 762 MCG tablet Take 400 mcg by mouth daily.     furosemide (LASIX) 40 MG tablet Take 40 mg by mouth daily as needed.     Glucosamine HCl 1500 MG TABS Take 1,500 mg by mouth every evening.      Multiple Vitamin (MULTIVITAMIN WITH MINERALS) TABS Take 1 tablet by mouth daily.      pantoprazole (PROTONIX) 40 MG tablet Take 1 tablet (40 mg total) by mouth daily. 90 tablet 3   potassium chloride SA (KLOR-CON) 20 MEQ tablet TAKE 1 TABLET BY MOUTH DAILY, GENERIC EQUIVALENT FOR KLOR-CON. 90 tablet 2   simvastatin (ZOCOR) 40 MG tablet TAKE ONE-HALF TABLET BY MOUTH AT BEDTIME 45 tablet 3    spironolactone (ALDACTONE) 25 MG tablet Take 12.5 mg by mouth daily as needed.     tiZANidine (ZANAFLEX) 2 MG tablet Take 1 tablet (2 mg total) by mouth daily as needed for muscle spasms. 90 tablet 0   warfarin (COUMADIN) 5 MG tablet Take 1/2 tablet daily except take 1 tablet on Sunday and Tuesday or Take as directed by anticoagulation clinic  90 day 90 tablet 3   carvedilol (COREG) 6.25 MG tablet Take 1 tablet (6.25 mg total) by mouth 2 (two) times daily. 180 tablet 3   losartan (COZAAR) 25 MG tablet Take 1 tablet (25 mg total) by mouth daily. 90 tablet 3   tamsulosin (FLOMAX) 0.4 MG CAPS capsule Take 0.4 mg by mouth at bedtime.     No current facility-administered medications for this visit.     Past Medical History:  Diagnosis Date   Anxiety    BPH (benign prostatic hypertrophy)    CAD (coronary artery disease)    s/p CABG   Diverticulosis    Diverticulosis of colon without hemorrhage 10/22/2007   Qualifier: Diagnosis of  By: Linna Darner MD, [name]     DOE (dyspnea on exertion)    Excess weight    Fasting hyperglycemia    Generalized weakness    GERD (gastroesophageal reflux disease)    Heart attack ([place]) 2001   Heart failure  CHF due to ischemic CM   History of kidney stones    Hyperlipidemia    Hypertension    Ischemic cardiomyopathy    Microscopic hematuria    [facility]   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   Past Surgical History:  Procedure Laterality Date   APPENDECTOMY     BIOPSY  10/21/2018   Procedure: BIOPSY;  Surgeon: Yetta Flock, MD;  Location: [facility];  Service: Gastroenterology;;   [place]  2005   Medtronic; s/p ICD gen change and RV lead revision April 2015   CATARACT EXTRACTION  2008   bilateral with lens implant   COLONOSCOPY  2004   Tics; [facility]   CORONARY  ARTERY BYPASS GRAFT  2000   1 vessel   CYSTOSCOPY  06/2012   Dr [name]   ESOPHAGOGASTRODUODENOSCOPY (EGD) WITH PROPOFOL N/A 10/21/2018   Procedure: ESOPHAGOGASTRODUODENOSCOPY (EGD) WITH PROPOFOL;  Surgeon: Yetta Flock, MD;  Location: [facility];  Service: Gastroenterology;  Laterality: N/A;   HOT HEMOSTASIS N/A 10/21/2018   Procedure: HOT HEMOSTASIS (ARGON PLASMA COAGULATION/BICAP);  Surgeon: Yetta Flock, MD;  Location: [facility];  Service: Gastroenterology;  Laterality: N/A;   IMPLANTABLE CARDIOVERTER DEFIBRILLATOR (ICD) GENERATOR CHANGE N/A 06/01/2013   Procedure: ICD GENERATOR CHANGE;  Surgeon: [name], MD;  Location: [facility] CATH LAB;  Service: Cardiovascular;  Laterality: N/A;   LEAD REVISION N/A 06/01/2013   Procedure: LEAD REVISION;  Surgeon: [name], MD;  Location: [facility] CATH LAB;  Service: Cardiovascular;  Laterality: N/A;   PILONIDAL CYST EXCISION     RIGHT/LEFT HEART CATH AND CORONARY/GRAFT ANGIOGRAPHY N/A 06/12/2017   Procedure: RIGHT/LEFT HEART CATH AND CORONARY/GRAFT ANGIOGRAPHY;  Surgeon: [name], MD;  Location: [facility];  Service: Cardiovascular;  Laterality: N/A;   TRANSTHORACIC ECHOCARDIOGRAM  08/29/2005     Family History  Problem Relation Age of Onset   Hypertension Father    Heart attack Father 72   Diabetes Brother        X 2   Nephrolithiasis Brother    Heart attack Brother 70   Heart attack Paternal Uncle 21   Sudden death Paternal Grandfather 31       ? heat stroke   Lung cancer Sister        "Asian lung cancer"   Stroke Neg Hx    Colon cancer Neg Hx    Esophageal cancer Neg Hx    Rectal cancer Neg Hx    Stomach cancer Neg Hx      Social History   Socioeconomic History   Marital status: Married    Spouse name: Not on file   Number of children: Not on file   Years of education: Not on file   Highest education level: Not on file  Occupational History   Occupation: Retired   Tobacco Use   Smoking status: Passive Smoke Exposure - Never Smoker   Smokeless tobacco: Never Used  Scientific laboratory technician Use: Never used  Substance and Sexual Activity   Alcohol use: No   Drug use: No   Sexual activity: Never  Other Topics Concern   Not on file  Social History Narrative   Married 1957. 4 children 2 boys 2 girls. 15 grandkids.  4 greatgrandkids.       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       Hobbies: genealogy, travel      No HCPOA-advised to do this.    Social Determinants of Health   Financial Resource Strain: Low Risk    Difficulty of Paying Living Expenses: Not hard at all  Food Insecurity: No Food Insecurity   Worried About Charity fundraiser in the Last Year: Never true   [place] in the Last Year: Never true  Transportation Needs: No Transportation Needs   Lack of Transportation (Medical): No   Lack of Transportation (Non-Medical): No  Physical Activity: Insufficiently Active   Days of Exercise per Week: 3 days   Minutes of Exercise per Session: 30 min  Stress: No Stress Concern Present   Feeling of Stress : Only a little  Social Connections: Engineer, building services of Communication with Friends and Family: More than three times a week   Frequency of Social Gatherings with Friends and Family: More than three times a week   Attends Religious Services: More than 4 times per year   Active Member of [employer] or Organizations: Yes   Attends Music therapist: More than 4 times per year   Marital Status: Married  Human resources officer Violence: Not At Risk   Fear of Current or Ex-Partner: No   Emotionally Abused: No   Physically Abused: No   Sexually Abused: No     BP 98/60    Pulse 84    Ht 5\' 11"  (1.803 m)    Wt 234 lb (106.1 kg)    SpO2 95%    BMI 32.64 kg/m   Physical Exam:  Well appearing NAD HEENT: Unremarkable Neck:  No JVD, no  thyromegally Lymphatics:  No adenopathy Back:  No CVA tenderness Lungs:  Clear with no wheezes HEART:  Regular rate rhythm, no murmurs, no rubs, no clicks Abd:  soft, positive bowel sounds, no organomegally, no rebound, no guarding Ext:  2 plus pulses, no edema, no cyanosis, no clubbing Skin:  No rashes no nodules Neuro:  CN II through XII intact, motor grossly intact   DEVICE  Normal device function.  See PaceArt for details.   Assess/Plan: 1. Chronic systolic heart failure - he has symptoms of a low output state, including hypotension. He is weak. His bp is low. I have recommended he reduce his dose of losartan from 50 mg to 25 mg daily and his coreg from 12.5 to 6.25 mg twice daily. 2. ICD - his medtronic single chamber ICD is working normally. We will recheck in several months. 3. CAD - he denies anginal symptoms. No change in meds except as noted above. 4. Dyslipidemia - he will continue his statin therapy.  Carleene Overlie Artyom Stencel,MD

## 2020-01-24 NOTE — Patient Instructions (Addendum)
Medication Instructions:  Your physician has recommended you make the following change in your medication:   1.  Reduce your carvedilol to 6.25 mg one tablet by mouth twice a day.  2.  Reduce your losartan to 25 mg one tablet by mouth daily.  Labwork: None ordered.  Testing/Procedures: None ordered.  Follow-Up: Your physician wants you to follow-up in: one year with Dr. Lovena Le.   You will receive a reminder letter in the mail two months in advance. If you don't receive a letter, please call our office to schedule the follow-up appointment.  Remote monitoring is used to monitor your ICD from home. This monitoring reduces the number of office visits required to check your device to one time per year. It allows [place] to keep an eye on the functioning of your device to ensure it is working properly. You are scheduled for a device check from home on 03/28/2020. You may send your transmission at any time that day. If you have a wireless device, the transmission will be sent automatically. After your physician reviews your transmission, you will receive a postcard with your next transmission date.  Any Other Special Instructions Will Be Listed Below (If Applicable).  If you need a refill on your cardiac medications before your next appointment, please call your pharmacy.

## 2020-02-08 ENCOUNTER — Ambulatory Visit (INDEPENDENT_AMBULATORY_CARE_PROVIDER_SITE_OTHER): Payer: Medicare Other | Admitting: General Practice

## 2020-02-08 ENCOUNTER — Other Ambulatory Visit: Payer: Self-pay

## 2020-02-08 DIAGNOSIS — Z7901 Long term (current) use of anticoagulants: Secondary | ICD-10-CM | POA: Diagnosis not present

## 2020-02-08 LAB — POCT INR: INR: 2 (ref 2.0–3.0)

## 2020-02-08 NOTE — Patient Instructions (Addendum)
Pre visit review using our clinic review tool, if applicable. No additional management support is needed unless otherwise documented below in the visit note.  Continue to  take 1/2 tablet daily except 1 tablet on Sundays and Thursdays.  Re-check in 6 weeks.

## 2020-02-11 NOTE — Progress Notes (Signed)
I have reviewed and agree with note, evaluation, plan.   Kayra Crowell, MD  

## 2020-02-13 ENCOUNTER — Other Ambulatory Visit (HOSPITAL_COMMUNITY): Payer: Self-pay | Admitting: *Deleted

## 2020-02-13 DIAGNOSIS — E785 Hyperlipidemia, unspecified: Secondary | ICD-10-CM

## 2020-02-13 MED ORDER — SIMVASTATIN 40 MG PO TABS: 20.0000 mg | ORAL_TABLET | Freq: Every day | ORAL | 3 refills | Status: DC

## 2020-02-27 NOTE — Progress Notes (Signed)
Phone [phone] In person visit   Subjective:   Joseph Malone is a 87 y.o. year old very pleasant male patient who presents for/with See problem oriented charting Chief Complaint  Patient presents with  . Diabetes  . Weakness    Generalized weakness episodes over the past two years   This visit occurred during the SARS-CoV-2 public health emergency.  Safety protocols were in place, including screening questions prior to the visit, additional usage of staff PPE, and extensive cleaning of exam room while observing appropriate contact time as indicated for disinfecting solutions.    Past Medical History-  Patient Active Problem List   Diagnosis Date Noted  . Chronic systolic heart failure ([place]) 12/09/2010    Priority: High  . Cardiomyopathy, ischemic 10/02/2009    Priority: High  . Automatic implantable cardioverter-defibrillator in situ 01/16/2009    Priority: High  . DM (diabetes mellitus) type II, controlled, with peripheral vascular disorder ([place]) 09/14/2008    Priority: High  . CAD in native artery 09/25/2006    Priority: High  . Nephrolithiasis 03/24/2016    Priority: Medium  . Hyperlipidemia associated with type 2 diabetes mellitus ([place]) 04/22/2007    Priority: Medium  . Hypertension associated with diabetes ([place]) 09/25/2006    Priority: Medium  . Long term (current) use of anticoagulants 07/08/2017    Priority: Low  . Erectile dysfunction 01/19/2014    Priority: Low  . Basal cell cancer 09/29/2012    Priority: Low  . Solitary pulmonary nodule 07/12/2012    Priority: Low  . BPH without obstruction/lower urinary tract symptoms 10/22/2007    Priority: Low  . Osteoarthritis 09/25/2006    Priority: Low  . Hematuria 09/25/2006    Priority: Low  . PVC's (premature ventricular contractions) 01/24/2020  . Melena     Medications- reviewed and updated Current Outpatient Medications  Medication Sig Dispense Refill  . acetaminophen (TYLENOL) 500 MG tablet Take 500  mg by mouth at bedtime.     Marland Kitchen aspirin 81 MG tablet Take 1 tablet (81 mg total) by mouth every evening.    . carvedilol (COREG) 6.25 MG tablet Take 1 tablet (6.25 mg total) by mouth 2 (two) times daily. 180 tablet 3  . Cholecalciferol (VITAMIN D3) 2000 UNITS TABS Take 2,000 Units by mouth daily.    . folic acid (FOLVITE) 678 MCG tablet Take 400 mcg by mouth daily.    . furosemide (LASIX) 40 MG tablet Take 40 mg by mouth daily as needed.    . Glucosamine HCl 1500 MG TABS Take 1,500 mg by mouth every evening.     Marland Kitchen losartan (COZAAR) 25 MG tablet Take 1 tablet (25 mg total) by mouth daily. 90 tablet 3  . Multiple Vitamin (MULTIVITAMIN WITH MINERALS) TABS Take 1 tablet by mouth daily.     . potassium chloride SA (KLOR-CON) 20 MEQ tablet TAKE 1 TABLET BY MOUTH DAILY, GENERIC EQUIVALENT FOR KLOR-CON. 90 tablet 2  . simvastatin (ZOCOR) 40 MG tablet Take 0.5 tablets (20 mg total) by mouth at bedtime. 45 tablet 3  . spironolactone (ALDACTONE) 25 MG tablet Take 12.5 mg by mouth daily as needed.    . tamsulosin (FLOMAX) 0.4 MG CAPS capsule Take 0.4 mg by mouth at bedtime.    Marland Kitchen tiZANidine (ZANAFLEX) 2 MG tablet Take 1 tablet (2 mg total) by mouth daily as needed for muscle spasms. 90 tablet 0  . warfarin (COUMADIN) 5 MG tablet Take 1/2 tablet daily except take 1 tablet on Sunday  and Tuesday or Take as directed by anticoagulation clinic  90 day 90 tablet 3   No current facility-administered medications for this visit.     Objective:  BP 122/72   Pulse 87   Temp 97.6 F (36.4 C) (Temporal)   Wt 232 lb 3.2 oz (105.3 kg)   SpO2 96%   BMI 32.39 kg/m  Gen: NAD, resting comfortably CV: RRR no murmurs rubs or gallops Lungs: CTAB no crackles, wheeze, rhonchi Abdomen: soft/nontender/nondistended/normal bowel sounds. No rebound or guarding.  Ext: trace edema Skin: warm, dry     Assessment and Plan   #Heart failure with reduced ejection fraction/ ischemic cardiomyopathy- Dr. Haroldine Laws S:  Medication:Carvedilol 6.25 mg twice daily, Lasix 40 mg as needed (takes calcium with Lasix), losartan 25 mg daily, spironolactone 12.5 mg as needed.  - was having some fatigue and coreg and losartan were cut down- that has been helpful.   Edema: no increase Weight gain: down 30 lbs over last year- cutting down on portions plus doesn't feel as hungry- separation from wife in living space has contributed Shortness of breath:  stable Orthopnea/PND: sleeping on fewer pillows A/P: overall appears stable- continue current medicine  -sensation of weakness at times better on lower BP med dose but will also check fatigue labs- cbc, cmp, tsh  #CAD with defibrillator in place/hyperlipidemia- no SOB above baseline, rare chest pain not worsening- better with belching #statin myalgia on higher doser #Apical thrombus-on Coumadin through cardiology clinic S: additional  Medication:Aspirin 81 mg, simvastatin 40 mg-only takes half tablet Lab Results  Component Value Date   CHOL 149 08/26/2019   HDL 41.60 08/26/2019   LDLCALC 69 08/26/2019   LDLDIRECT 77.0 11/17/2017   TRIG 190.0 (H) 08/26/2019   CHOLHDL 4 08/26/2019   A/P: CAD appears stable-continue current medication Hyperlipidemia-lipids at goal with LDL under 70 last check-check next visit For apical thrombus-continue Coumadin  #hypertension S: medication: Carvedilol 6.25 mg twice daily, losartan 25 mg.  Lasix and spironolactone as needed Home readings #s: no recent checks - in past 120/80 or below BP Readings from Last 3 Encounters:  02/28/20 122/72  01/24/20 98/60  08/26/19 120/74  A/P: Stable. Continue current medications.   # Diabetes S: Medication:Diet controlled CBGs- Home readings around 120 for most part Exercise and diet- started exercising until last week with snow issues- has been to [facility] and walked in [place]ihborhood Lab Results  Component Value Date   HGBA1C POC 6.5 (A) 02/28/2020   HGBA1C 7.2 (H) 08/26/2019   HGBA1C 7.4 (H)  01/25/2019  A/P: Diabetes well controlled without medication-continue diet control and recheck in 6 months  #Melena history-history of gastric erosions later requiring cauterization-on omeprazole for 3 months in 2020- no recent issues-later advanced to Protonix 40 mg daily by GI- now off that ompletely Lab Results  Component Value Date   WBC 7.7 08/26/2019   HGB 14.9 08/26/2019   HCT 43.7 08/26/2019   MCV 92.9 08/26/2019   PLT 155.0 08/26/2019   Recommended follow up: Return in about 6 months (around 08/27/2020) for physical or sooner if needed. Future Appointments  Date Time Provider [place]  03/21/2020 10:30 AM [facility]IN CLINIC [facility]  03/28/2020  8:00 AM [facility] DEVICE REMOTES [facility]  03/28/2020  2:40 PM Bensimhon, Shaune Pascal, MD [facility] None  06/27/2020  8:00 AM [facility] DEVICE REMOTES [facility]  09/03/2020  1:00 PM [facility] HEALTH COACH [facility]  09/26/2020  8:00 AM [facility] DEVICE REMOTES [facility]  12/26/2020  8:00 AM [facility] DEVICE REMOTES [facility]  03/27/2021  8:00 AM [facility] DEVICE REMOTES [facility]  06/26/2021  8:00 AM [facility] DEVICE REMOTES [facility]    Lab/Order associations:   ICD-10-CM   1. DM (diabetes mellitus) type II, controlled, with peripheral vascular disorder (HCC)  E11.51 POCT HgB A1C    CBC with Differential/Platelet    Comprehensive metabolic panel    CANCELED: Hemoglobin A1c  2. Hypertension associated with diabetes ([place])  E11.59 CBC with Differential/Platelet   I15.2 Comprehensive metabolic panel  3. Hyperlipidemia associated with type 2 diabetes mellitus (HCC)  E11.69 TSH   E78.5   4. Chronic systolic heart failure (HCC) Chronic I50.22   5. Fatigue, unspecified type  R53.83 TSH   Return precautions advised.  Garret Reddish, MD

## 2020-02-27 NOTE — Patient Instructions (Addendum)
  Health Maintenance Due  Topic Date Due  . TETANUS/TD - team please add this from [facility] [place] dec 15 09/18/2019    Please stop by lab before you go If you have mychart- we will send your results within 3 business days of [place] receiving them.  If you do not have mychart- we will call you about results within 5 business days of [place] receiving them.  *please also note that you will see labs on mychart as soon as they post. I will later go in and write notes on them- will say "notes from Dr. Yong Channel"

## 2020-02-28 ENCOUNTER — Other Ambulatory Visit: Payer: Self-pay

## 2020-02-28 ENCOUNTER — Ambulatory Visit (INDEPENDENT_AMBULATORY_CARE_PROVIDER_SITE_OTHER): Payer: Medicare Other | Admitting: Family Medicine

## 2020-02-28 ENCOUNTER — Encounter: Payer: Self-pay | Admitting: Family Medicine

## 2020-02-28 VITALS — BP 122/72 | HR 87 | Temp 97.6°F | Wt 232.2 lb

## 2020-02-28 DIAGNOSIS — E1159 Type 2 diabetes mellitus with other circulatory complications: Secondary | ICD-10-CM | POA: Diagnosis not present

## 2020-02-28 DIAGNOSIS — I2510 Atherosclerotic heart disease of native coronary artery without angina pectoris: Secondary | ICD-10-CM

## 2020-02-28 DIAGNOSIS — I5022 Chronic systolic (congestive) heart failure: Secondary | ICD-10-CM | POA: Diagnosis not present

## 2020-02-28 DIAGNOSIS — E1151 Type 2 diabetes mellitus with diabetic peripheral angiopathy without gangrene: Secondary | ICD-10-CM | POA: Diagnosis not present

## 2020-02-28 DIAGNOSIS — E785 Hyperlipidemia, unspecified: Secondary | ICD-10-CM | POA: Diagnosis not present

## 2020-02-28 DIAGNOSIS — R5383 Other fatigue: Secondary | ICD-10-CM

## 2020-02-28 DIAGNOSIS — E1169 Type 2 diabetes mellitus with other specified complication: Secondary | ICD-10-CM | POA: Diagnosis not present

## 2020-02-28 DIAGNOSIS — I152 Hypertension secondary to endocrine disorders: Secondary | ICD-10-CM

## 2020-02-28 DIAGNOSIS — I255 Ischemic cardiomyopathy: Secondary | ICD-10-CM

## 2020-02-28 LAB — CBC WITH DIFFERENTIAL/PLATELET
Basophils Absolute: 0.1 10*3/uL (ref 0.0–0.1)
Basophils Relative: 1 % (ref 0.0–3.0)
Eosinophils Absolute: 0.2 10*3/uL (ref 0.0–0.7)
Eosinophils Relative: 2.7 % (ref 0.0–5.0)
HCT: 42.7 % (ref 39.0–52.0)
Hemoglobin: 14.6 g/dL (ref 13.0–17.0)
Lymphocytes Relative: 27.4 % (ref 12.0–46.0)
Lymphs Abs: 2.3 10*3/uL (ref 0.7–4.0)
MCHC: 34.2 g/dL (ref 30.0–36.0)
MCV: 92.2 fl (ref 78.0–100.0)
Monocytes Absolute: 0.7 10*3/uL (ref 0.1–1.0)
Monocytes Relative: 8.3 % (ref 3.0–12.0)
Neutro Abs: 5.2 10*3/uL (ref 1.4–7.7)
Neutrophils Relative %: 60.6 % (ref 43.0–77.0)
Platelets: 168 10*3/uL (ref 150.0–400.0)
RBC: 4.63 Mil/uL (ref 4.22–5.81)
RDW: 13.1 % (ref 11.5–15.5)
WBC: 8.6 10*3/uL (ref 4.0–10.5)

## 2020-02-28 LAB — COMPREHENSIVE METABOLIC PANEL
ALT: 16 U/L (ref 0–53)
AST: 19 U/L (ref 0–37)
Albumin: 4 g/dL (ref 3.5–5.2)
Alkaline Phosphatase: 49 U/L (ref 39–117)
BUN: 26 mg/dL — ABNORMAL HIGH (ref 6–23)
CO2: 25 mEq/L (ref 19–32)
Calcium: 9.3 mg/dL (ref 8.4–10.5)
Chloride: 107 mEq/L (ref 96–112)
Creatinine, Ser: 1.2 mg/dL (ref 0.40–1.50)
GFR: 54.17 mL/min — ABNORMAL LOW (ref >60.00)
Glucose, Bld: 191 mg/dL — ABNORMAL HIGH (ref 70–99)
Potassium: 4.1 mEq/L (ref 3.5–5.1)
Sodium: 139 mEq/L (ref 135–145)
Total Bilirubin: 0.4 mg/dL (ref 0.2–1.2)
Total Protein: 6.8 g/dL (ref 6.0–8.3)

## 2020-02-28 LAB — POCT GLYCOSYLATED HEMOGLOBIN (HGB A1C): Hemoglobin A1C: 6.5 % — AB (ref 4.0–5.6)

## 2020-02-28 LAB — TSH: TSH: 0.47 u[IU]/mL (ref 0.35–4.50)

## 2020-03-21 ENCOUNTER — Other Ambulatory Visit: Payer: Self-pay

## 2020-03-21 ENCOUNTER — Ambulatory Visit (INDEPENDENT_AMBULATORY_CARE_PROVIDER_SITE_OTHER): Payer: Medicare Other | Admitting: General Practice

## 2020-03-21 DIAGNOSIS — Z7901 Long term (current) use of anticoagulants: Secondary | ICD-10-CM | POA: Diagnosis not present

## 2020-03-21 LAB — POCT INR: INR: 2.5 (ref 2.0–3.0)

## 2020-03-21 NOTE — Progress Notes (Signed)
I have reviewed and agree with note, evaluation, plan.   Lerry Cordrey, MD  

## 2020-03-21 NOTE — Patient Instructions (Signed)
Pre visit review using our clinic review tool, if applicable. No additional management support is needed unless otherwise documented below in the visit note.  Continue to  take 1/2 tablet daily except 1 tablet on Sundays and Thursdays.  Re-check in 6 weeks.

## 2020-03-26 DIAGNOSIS — N401 Enlarged prostate with lower urinary tract symptoms: Secondary | ICD-10-CM | POA: Diagnosis not present

## 2020-03-26 DIAGNOSIS — R3129 Other microscopic hematuria: Secondary | ICD-10-CM | POA: Diagnosis not present

## 2020-03-26 DIAGNOSIS — R3915 Urgency of urination: Secondary | ICD-10-CM | POA: Diagnosis not present

## 2020-03-28 ENCOUNTER — Other Ambulatory Visit: Payer: Self-pay

## 2020-03-28 ENCOUNTER — Encounter (HOSPITAL_COMMUNITY): Payer: Self-pay | Admitting: Internal Medicine

## 2020-03-28 ENCOUNTER — Ambulatory Visit (HOSPITAL_COMMUNITY)
Admission: RE | Admit: 2020-03-28 | Discharge: 2020-03-28 | Disposition: A | Discharge status: Home / Self Care | Payer: Medicare Other | Source: Ambulatory Visit | Attending: Internal Medicine | Admitting: Internal Medicine

## 2020-03-28 ENCOUNTER — Ambulatory Visit (INDEPENDENT_AMBULATORY_CARE_PROVIDER_SITE_OTHER): Payer: Medicare Other

## 2020-03-28 VITALS — BP 120/78 | HR 86 | Wt 234.4 lb

## 2020-03-28 DIAGNOSIS — Z79899 Other long term (current) drug therapy: Secondary | ICD-10-CM | POA: Diagnosis not present

## 2020-03-28 DIAGNOSIS — Z951 Presence of aortocoronary bypass graft: Secondary | ICD-10-CM | POA: Diagnosis not present

## 2020-03-28 DIAGNOSIS — Z6832 Body mass index (BMI) 32.0-32.9, adult: Secondary | ICD-10-CM | POA: Diagnosis not present

## 2020-03-28 DIAGNOSIS — Z8249 Family history of ischemic heart disease and other diseases of the circulatory system: Secondary | ICD-10-CM | POA: Diagnosis not present

## 2020-03-28 DIAGNOSIS — I252 Old myocardial infarction: Secondary | ICD-10-CM | POA: Diagnosis not present

## 2020-03-28 DIAGNOSIS — I2510 Atherosclerotic heart disease of native coronary artery without angina pectoris: Secondary | ICD-10-CM | POA: Insufficient documentation

## 2020-03-28 DIAGNOSIS — E7439 Other disorders of intestinal carbohydrate absorption: Secondary | ICD-10-CM | POA: Diagnosis not present

## 2020-03-28 DIAGNOSIS — Z7901 Long term (current) use of anticoagulants: Secondary | ICD-10-CM | POA: Diagnosis not present

## 2020-03-28 DIAGNOSIS — I5022 Chronic systolic (congestive) heart failure: Secondary | ICD-10-CM | POA: Diagnosis not present

## 2020-03-28 DIAGNOSIS — I110 Hypertensive heart disease with heart failure: Secondary | ICD-10-CM | POA: Diagnosis not present

## 2020-03-28 DIAGNOSIS — Z7982 Long term (current) use of aspirin: Secondary | ICD-10-CM | POA: Insufficient documentation

## 2020-03-28 DIAGNOSIS — I255 Ischemic cardiomyopathy: Secondary | ICD-10-CM

## 2020-03-28 DIAGNOSIS — E669 Obesity, unspecified: Secondary | ICD-10-CM | POA: Insufficient documentation

## 2020-03-28 DIAGNOSIS — Z880 Allergy status to penicillin: Secondary | ICD-10-CM | POA: Insufficient documentation

## 2020-03-28 DIAGNOSIS — E785 Hyperlipidemia, unspecified: Secondary | ICD-10-CM | POA: Diagnosis not present

## 2020-03-28 DIAGNOSIS — Z95810 Presence of automatic (implantable) cardiac defibrillator: Secondary | ICD-10-CM | POA: Insufficient documentation

## 2020-03-28 HISTORY — DX: Heart failure, unspecified: I50.9

## 2020-03-28 LAB — CUP PACEART REMOTE DEVICE CHECK
Battery Remaining Longevity: 65 mo
Battery Voltage: 2.98 V
Brady Statistic RV Percent Paced: 0.01 %
Date Time Interrogation Session: 20220216072405
HighPow Impedance: 82 Ohm
Implantable Lead Implant Date: 20150422
Implantable Lead Location: 753860
Implantable Lead Model: 6935
Implantable Pulse Generator Implant Date: 20150422
Lead Channel Impedance Value: 456 Ohm
Lead Channel Impedance Value: 513 Ohm
Lead Channel Pacing Threshold Amplitude: 0.5 V
Lead Channel Pacing Threshold Pulse Width: 0.4 ms
Lead Channel Sensing Intrinsic Amplitude: 12.875 mV
Lead Channel Sensing Intrinsic Amplitude: 12.875 mV
Lead Channel Setting Pacing Amplitude: 2 V
Lead Channel Setting Pacing Pulse Width: 0.4 ms
Lead Channel Setting Sensing Sensitivity: 0.3 mV

## 2020-03-28 NOTE — Addendum Note (Signed)
Encounter addended by: Malena Edman, RN on: 03/28/2020 3:23 PM  Actions taken: Clinical Note Signed

## 2020-03-28 NOTE — Patient Instructions (Addendum)
Please call our office in November 2022 to schedule your follow up appointment and echocardiogram  Your physician has requested that you have an echocardiogram. Echocardiography is a painless test that uses sound waves to create images of your heart. It provides your doctor with information about the size and shape of your heart and how well your heart's chambers and valves are working. This procedure takes approximately one hour. There are no restrictions for this procedure.  If you have any questions or concerns before your next appointment please send [place] a message through [place] or call our office at [phone].    TO LEAVE A MESSAGE FOR THE NURSE SELECT OPTION 2, PLEASE LEAVE A MESSAGE INCLUDING: . YOUR NAME . DATE OF BIRTH . CALL BACK NUMBER . REASON FOR CALL**this is important as we prioritize the call backs  YOU WILL RECEIVE A CALL BACK THE SAME DAY AS LONG AS YOU CALL BEFORE 4:00 PM

## 2020-03-28 NOTE — Progress Notes (Signed)
[facility] Note   Date:  03/28/2020   ID:  Joseph Malone, DOB [birth year], MRN [id number]  Location: Home  Provider location: [facility] Type of Visit: Established patient  PCP:  Marin Olp, MD  Cardiologist:  No primary care provider on file. Primary HF: Tajana Crotteau  Chief Complaint: Heart Failure follow-up   History of Present Illness:  Joseph Malone is a pleasant 87 year old male with a history of coronary artery disease status post large anterior MI with coronary  bypass grafting in 2001 with a LIMA to the LAD.  He has history of  congestive heart failure secondary to resultant ischemic cardiomyopathy,  ejection fraction however is in the 35%.  He is status post  single-chamber ICD.   The rest of his medical history is notable for obesity, hypertension,  hyperlipidemia, and glucose intolerance.  Cath in 5/19 with stable CAD with patent LIMA to LAD.  He presents today for routine f/u. Overall doing pretty well. Says his wife is struggling with some form of dementia and moved out. Chest still sore. No angina or SOB. No edema, orthopnea or PND. BP was low after losing 20 pounds and BP was low. Dr. Lovena Le cut his carvedilol and losartan in half. Walking 30-40 mins in the park.   RHC/LHC 06/12/2017  Ao = 143/77 (105) LV 145/8 RA = 3 RV = 26/4 PA = 27/5 (12) PCW = 9 Fick cardiac output/index = 4.4/2.0 PVR = 0.7 WU FA sat = 97% PA sat = 62%, 63% Assessment: 1. Left-dominant coronary system with chronically-occluded LAD 2. Non-obstructive CAD elsewhere 3. Patent LIMA to LAD 4. LV gram not performed due to LV thrombus 5. Normal right heart pressure with mildly reduced cardiac output  ECHO 05/2017 EF 30-35% with   [name] denies symptoms worrisome for COVID 19.   Past Medical History:  Diagnosis Date  . Anxiety   . BPH (benign prostatic hypertrophy)   . CAD (coronary artery disease)    s/p CABG  . CHF  (congestive heart failure) ([place])   . Diverticulosis   . Diverticulosis of colon without hemorrhage 10/22/2007   Qualifier: Diagnosis of  By: Linna Darner MD, [name]    . DOE (dyspnea on exertion)   . Excess weight   . Fasting hyperglycemia   . Generalized weakness   . GERD (gastroesophageal reflux disease)   . Heart attack ([place]) 2001  . Heart failure    CHF due to ischemic CM  . History of kidney stones   . Hyperlipidemia   . Hypertension   . Ischemic cardiomyopathy   . Microscopic hematuria    [facility]  . 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 APPENDECTOMY    . BIOPSY  10/21/2018   Procedure: BIOPSY;  Surgeon: Yetta Flock, MD;  Location: [facility];  Service: Gastroenterology;;  . CARDIAC CATHETERIZATION  2000  . CARDIAC DEFIBRILLATOR PLACEMENT  2005   [employer]; s/p ICD gen change and RV lead revision April 2015  . CATARACT EXTRACTION  2008   bilateral with lens implant  . COLONOSCOPY  2004   Tics; [facility]  . CORONARY ARTERY BYPASS GRAFT  2000   1 vessel  . CYSTOSCOPY  06/2012   Dr [name]  . ESOPHAGOGASTRODUODENOSCOPY (EGD) WITH PROPOFOL N/A 10/21/2018   Procedure: ESOPHAGOGASTRODUODENOSCOPY (EGD) WITH PROPOFOL;  Surgeon: Yetta Flock, MD;  Location: [facility];  Service: Gastroenterology;  Laterality: N/A;  . HOT HEMOSTASIS N/A 10/21/2018   Procedure: HOT HEMOSTASIS (ARGON PLASMA COAGULATION/BICAP);  Surgeon: Yetta Flock, MD;  Location: [facility];  Service: Gastroenterology;  Laterality: N/A;  . IMPLANTABLE CARDIOVERTER DEFIBRILLATOR (ICD) GENERATOR CHANGE N/A 06/01/2013   Procedure: ICD GENERATOR CHANGE;  Surgeon: [name], MD;  Location: [facility] CATH LAB;  Service: Cardiovascular;  Laterality: N/A;  . LEAD REVISION N/A 06/01/2013   Procedure: LEAD REVISION;  Surgeon: [name], MD;  Location: [facility] CATH LAB;  Service: Cardiovascular;  Laterality: N/A;  . PILONIDAL CYST  EXCISION    . RIGHT/LEFT HEART CATH AND CORONARY/GRAFT ANGIOGRAPHY N/A 06/12/2017   Procedure: RIGHT/LEFT HEART CATH AND CORONARY/GRAFT ANGIOGRAPHY;  Surgeon: [name], MD;  Location: [place] [facility];  Service: Cardiovascular;  Laterality: N/A;  . TRANSTHORACIC ECHOCARDIOGRAM  08/29/2005     Current Outpatient Medications  Medication Sig Dispense Refill  . acetaminophen (TYLENOL) 500 MG tablet Take 500 mg by mouth at bedtime.     Marland Kitchen aspirin 81 MG tablet Take 1 tablet (81 mg total) by mouth every evening.    . carvedilol (COREG) 6.25 MG tablet Take 1 tablet (6.25 mg total) by mouth 2 (two) times daily. 180 tablet 3  . Cholecalciferol (VITAMIN D3) 2000 UNITS TABS Take 2,000 Units by mouth daily.    . folic acid (FOLVITE) 086 MCG tablet Take 400 mcg by mouth daily.    . furosemide (LASIX) 40 MG tablet Take 40 mg by mouth daily as needed.    . Glucosamine HCl 1500 MG TABS Take 1,500 mg by mouth every evening.     Marland Kitchen losartan (COZAAR) 25 MG tablet Take 1 tablet (25 mg total) by mouth daily. 90 tablet 3  . Multiple Vitamin (MULTIVITAMIN WITH MINERALS) TABS Take 1 tablet by mouth daily.     . potassium chloride SA (KLOR-CON) 20 MEQ tablet TAKE 1 TABLET BY MOUTH DAILY, GENERIC EQUIVALENT FOR KLOR-CON. 90 tablet 2  . simvastatin (ZOCOR) 40 MG tablet Take 0.5 tablets (20 mg total) by mouth at bedtime. 45 tablet 3  . spironolactone (ALDACTONE) 25 MG tablet Take 12.5 mg by mouth daily as needed.    . tamsulosin (FLOMAX) 0.4 MG CAPS capsule Take 0.4 mg by mouth at bedtime.    Marland Kitchen tiZANidine (ZANAFLEX) 2 MG tablet Take 1 tablet (2 mg total) by mouth daily as needed for muscle spasms. 90 tablet 0  . warfarin (COUMADIN) 5 MG tablet Take 1/2 tablet daily except take 1 tablet on Sunday and Tuesday or Take as directed by anticoagulation clinic  90 day 90 tablet 3   No current facility-administered medications for this encounter.    Allergies:   Penicillins   Social History:  The patient  reports  that he is a non-smoker but has been exposed to tobacco smoke. He has never used smokeless tobacco. He reports that he does not drink alcohol and does not use drugs.   Family History:  The patient's family history includes Diabetes in his brother; Heart attack (age of onset: 33) in his brother, father, and paternal uncle; Hypertension in his father; Lung cancer in his sister; Nephrolithiasis in his brother; Sudden death (age of onset: 69) in his paternal grandfather.   ROS:  Please see the history of present illness.   All other systems are personally reviewed and negative.   Vitals:   03/28/20 1446  BP: 120/78  Pulse: 86  SpO2: 93%  Weight: 106.3 kg (234 lb  6.4 oz)    Exam:  General:  Well appearing. No resp difficulty HEENT: normal Neck: supple. no JVD. Carotids 2+ bilat; no bruits. No lymphadenopathy or thryomegaly appreciated. Cor: PMI nondisplaced. Regular rate & rhythm. No rubs, gallops or murmurs. Lungs: clear Abdomen: soft, nontender, nondistended. No hepatosplenomegaly. No bruits or masses. Good bowel sounds. Extremities: no cyanosis, clubbing, rash, edema Neuro: alert & orientedx3, cranial nerves grossly intact. moves all 4 extremities w/o difficulty. Affect pleasant    Recent Labs: 02/28/2020: ALT 16; BUN 26; Creatinine, Ser 1.20; Hemoglobin 14.6; Platelets 168.0; Potassium 4.1; Sodium 139; TSH 0.47  Personally reviewed   Wt Readings from Last 3 Encounters:  03/28/20 106.3 kg (234 lb 6.4 oz)  02/28/20 105.3 kg (232 lb 3.2 oz)  01/24/20 106.1 kg (234 lb)      ASSESSMENT AND PLAN:  1) CAD: s/p CABG 2001with LIMA to LAD  - Stable. No s/s angina - Cath  5/19 with intact revascularization and normal filling pressures  2) Chronic systolic HF: ICM,  - Echo 05/2017 EF 30-35% with apical thrombus. [employer] ICD. No shocks - Cath with 5/19 with intact revascularization and normal filling pressures  - NYHA II. Volume status ok. Continue lasix 40 mg daily.  - Continue 12.5  mg spiro daily.  - Continue coreg 6.25 mg twice a day + losartan 25 mg daily. (recently cut back due to low BP) - Will defer SGLT2i at this point - Given ongoing fatigue and balance issues[facility] and [facility] exercise program. We also discussed PT/OT evaluation but he prefers to defer that for now 4) HTN - Blood pressure well controlled. Continue current regimen. 5) HLD - managed by PCP. Continue statin. No change.  6) Apical Thrombus - Remains on coumadin. No bleeding.    Glori Bickers, MD  03/28/2020 3:06 PM  [facility] [address] [facility] [place] [address] [phone] (office) [phone] (fax)

## 2020-03-28 NOTE — Addendum Note (Signed)
Encounter addended by: Malena Edman, RN on: 03/28/2020 3:25 PM  Actions taken: Order list changed, Diagnosis association updated

## 2020-03-28 NOTE — Addendum Note (Signed)
Encounter addended by: Malena Edman, RN on: 03/28/2020 3:19 PM  Actions taken: Clinical Note Signed

## 2020-04-03 NOTE — Progress Notes (Signed)
Remote ICD transmission.   

## 2020-04-30 ENCOUNTER — Other Ambulatory Visit: Payer: Self-pay | Admitting: Physician Assistant

## 2020-04-30 ENCOUNTER — Telehealth: Payer: Self-pay

## 2020-04-30 ENCOUNTER — Encounter: Payer: Self-pay | Admitting: Physician Assistant

## 2020-04-30 ENCOUNTER — Ambulatory Visit (INDEPENDENT_AMBULATORY_CARE_PROVIDER_SITE_OTHER): Payer: Medicare Other | Admitting: Physician Assistant

## 2020-04-30 ENCOUNTER — Ambulatory Visit (INDEPENDENT_AMBULATORY_CARE_PROVIDER_SITE_OTHER): Payer: Medicare Other

## 2020-04-30 ENCOUNTER — Other Ambulatory Visit: Payer: Self-pay

## 2020-04-30 VITALS — BP 90/60 | HR 92 | Temp 98.2°F | Ht 71.0 in | Wt 233.4 lb

## 2020-04-30 DIAGNOSIS — M79651 Pain in right thigh: Secondary | ICD-10-CM

## 2020-04-30 DIAGNOSIS — R319 Hematuria, unspecified: Secondary | ICD-10-CM

## 2020-04-30 DIAGNOSIS — M25551 Pain in right hip: Secondary | ICD-10-CM

## 2020-04-30 DIAGNOSIS — S80219A Abrasion, unspecified knee, initial encounter: Secondary | ICD-10-CM

## 2020-04-30 DIAGNOSIS — R82998 Other abnormal findings in urine: Secondary | ICD-10-CM

## 2020-04-30 DIAGNOSIS — R531 Weakness: Secondary | ICD-10-CM | POA: Diagnosis not present

## 2020-04-30 LAB — COMPREHENSIVE METABOLIC PANEL
ALT: 23 U/L (ref 0–53)
AST: 23 U/L (ref 0–37)
Albumin: 3.9 g/dL (ref 3.5–5.2)
Alkaline Phosphatase: 63 U/L (ref 39–117)
BUN: 22 mg/dL (ref 6–23)
CO2: 27 mEq/L (ref 19–32)
Calcium: 9.1 mg/dL (ref 8.4–10.5)
Chloride: 101 mEq/L (ref 96–112)
Creatinine, Ser: 1.25 mg/dL (ref 0.40–1.50)
GFR: 51.52 mL/min — ABNORMAL LOW (ref >60.00)
Glucose, Bld: 175 mg/dL — ABNORMAL HIGH (ref 70–99)
Potassium: 4.3 mEq/L (ref 3.5–5.1)
Sodium: 138 mEq/L (ref 135–145)
Total Bilirubin: 0.7 mg/dL (ref 0.2–1.2)
Total Protein: 6.8 g/dL (ref 6.0–8.3)

## 2020-04-30 LAB — CBC WITH DIFFERENTIAL/PLATELET
Basophils Absolute: 0.1 10*3/uL (ref 0.0–0.1)
Basophils Relative: 0.5 % (ref 0.0–3.0)
Eosinophils Absolute: 0.1 10*3/uL (ref 0.0–0.7)
Eosinophils Relative: 0.6 % (ref 0.0–5.0)
HCT: 41.5 % (ref 39.0–52.0)
Hemoglobin: 14.5 g/dL (ref 13.0–17.0)
Lymphocytes Relative: 17.1 % (ref 12.0–46.0)
Lymphs Abs: 2.2 10*3/uL (ref 0.7–4.0)
MCHC: 34.9 g/dL (ref 30.0–36.0)
MCV: 90.7 fl (ref 78.0–100.0)
Monocytes Absolute: 1.2 10*3/uL — ABNORMAL HIGH (ref 0.1–1.0)
Monocytes Relative: 9.5 % (ref 3.0–12.0)
Neutro Abs: 9.1 10*3/uL — ABNORMAL HIGH (ref 1.4–7.7)
Neutrophils Relative %: 72.3 % (ref 43.0–77.0)
Platelets: 194 10*3/uL (ref 150.0–400.0)
RBC: 4.58 Mil/uL (ref 4.22–5.81)
RDW: 12.8 % (ref 11.5–15.5)
WBC: 12.6 10*3/uL — ABNORMAL HIGH (ref 4.0–10.5)

## 2020-04-30 LAB — POC URINALSYSI DIPSTICK (AUTOMATED)
Bilirubin, UA: NEGATIVE
Blood, UA: POSITIVE
Glucose, UA: NEGATIVE
Ketones, UA: NEGATIVE
Protein, UA: POSITIVE — AB
Spec Grav, UA: 1.02 (ref 1.010–1.025)
Urobilinogen, UA: 0.2 E.U./dL
pH, UA: 5.5 (ref 5.0–8.0)

## 2020-04-30 MED ORDER — CARVEDILOL 3.125 MG PO TABS: 3.1250 mg | ORAL_TABLET | Freq: Two times a day (BID) | ORAL | 1 refills | Status: DC

## 2020-04-30 MED ORDER — CIPROFLOXACIN HCL 250 MG PO TABS: 250.0000 mg | ORAL_TABLET | Freq: Two times a day (BID) | ORAL | 0 refills | Status: DC

## 2020-04-30 NOTE — Progress Notes (Signed)
Joseph Malone is a 87 y.o. male here for a new problem.  I acted as a Education administrator for [employer], PA-C Anselmo Pickler, LPN   History of Present Illness:   Chief Complaint  Patient presents with  . Fall    HPI   Fall Pt fell out of bed Friday night when getting up to go to the bathroom. Legs went out on him and he went down on the floor onto both knees. Did not hit head or have LOC. He crawled into the bathroom and laid down for a hour to get strength back. Then crawled back to the bed and was able to get into bed. He is currently separating from his wife and lives alone.  He has been seeing Dr. Jeffie Pollock and Dr. Lovena Le in cardiology for complex cardiac hx. In Dec 2021 -- he was told to reduce his dose of losartan from 50 mg to 25 mg daily and his coreg from 12.5 to 6.25 mg twice daily. He has been doing this. He is unable to check his BP medication at home.  Pt says since his fall, he is having pain right hip pain that radiates into right thigh. Pt also having some soreness right and left arms. Pt takes Tylenol and Tylenol PM. Pt also has open abrasions on both knees. Has been using Vaseline.  Having frequent urination, seeing a kidney doctor soon about this per his report. Was started on flmaox but this is not helping. Currently has reduced appetite. Denies: fevers, chills, n/v/d.  Past Medical History:  Diagnosis Date  . Anxiety   . BPH (benign prostatic hypertrophy)   . CAD (coronary artery disease)    s/p CABG  . CHF (congestive heart failure) ([place])   . Diverticulosis   . Diverticulosis of colon without hemorrhage 10/22/2007   Qualifier: Diagnosis of  By: Linna Darner MD, [name]    . DOE (dyspnea on exertion)   . Excess weight   . Fasting hyperglycemia   . Generalized weakness   . GERD (gastroesophageal reflux disease)   . Heart attack ([place]) 2001  . Heart failure    CHF due to ischemic CM  . History of kidney stones   . Hyperlipidemia   . Hypertension   . Ischemic  cardiomyopathy   . Microscopic hematuria    [facility]  . 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 Smoking status: Passive Smoke Exposure - Never Smoker  . Smokeless tobacco: Never Used  Vaping Use  . Vaping Use: Never used  Substance Use Topics  . Alcohol use: No  . Drug use: No    Past Surgical History:  Procedure Laterality Date  . APPENDECTOMY    . BIOPSY  10/21/2018   Procedure: BIOPSY;  Surgeon: Yetta Flock, MD;  [facility];  Service: Gastroenterology;;  . CARDIAC CATHETERIZATION  2000  . CARDIAC DEFIBRILLATOR PLACEMENT  2005   [employer]; s/p ICD gen change and RV lead revision April 2015  . CATARACT EXTRACTION  2008   bilateral with lens implant  . COLONOSCOPY  2004   Tics; [facility]  . CORONARY ARTERY BYPASS GRAFT  2000   1 vessel  . CYSTOSCOPY  06/2012   Dr [name]  . ESOPHAGOGASTRODUODENOSCOPY (EGD) WITH PROPOFOL N/A 10/21/2018   Procedure: ESOPHAGOGASTRODUODENOSCOPY (EGD) WITH PROPOFOL;  Surgeon: Yetta Flock, MD;  [facility];  Service: Gastroenterology;  Laterality: N/A;  . HOT HEMOSTASIS  N/A 10/21/2018   Procedure: HOT HEMOSTASIS (ARGON PLASMA COAGULATION/BICAP);  Surgeon: Yetta Flock, MD;  Location: [facility];  Service: Gastroenterology;  Laterality: N/A;  . IMPLANTABLE CARDIOVERTER DEFIBRILLATOR (ICD) GENERATOR CHANGE N/A 06/01/2013   Procedure: ICD GENERATOR CHANGE;  Surgeon: [name], MD;  Location: [facility] CATH LAB;  Service: Cardiovascular;  Laterality: N/A;  . LEAD REVISION N/A 06/01/2013   Procedure: LEAD REVISION;  Surgeon: [name], MD;  Location: [facility] CATH LAB;  Service: Cardiovascular;  Laterality: N/A;  . PILONIDAL CYST EXCISION    . RIGHT/LEFT HEART CATH AND CORONARY/GRAFT ANGIOGRAPHY N/A 06/12/2017   Procedure: RIGHT/LEFT HEART CATH AND CORONARY/GRAFT ANGIOGRAPHY;  Surgeon: [name], MD;  Location: [place] [facility] LAB;  Service:  Cardiovascular;  Laterality: N/A;  . TRANSTHORACIC ECHOCARDIOGRAM  08/29/2005    Family History  Problem Relation Age of Onset  . Hypertension Father   . Heart attack Father 68  . Diabetes Brother        X 2  . Nephrolithiasis Brother   . Heart attack Brother 63  . Heart attack Paternal Uncle 21  . Sudden death Paternal Grandfather 65       ? heat stroke  . Lung cancer Sister        "Asian lung cancer"  . Stroke Neg Hx   . Colon cancer Neg Hx   . Esophageal cancer Neg Hx   . Rectal cancer Neg Hx   . Stomach cancer Neg Hx     Allergies  Allergen Reactions  . Penicillins Rash and Other (See Comments)    Because of a history of documented adverse serious drug reaction;Medi Alert bracelet  is recommended Has patient had a PCN reaction causing immediate rash, facial/tongue/throat swelling, SOB or lightheadedness with hypotension: No Has patient had a PCN reaction causing severe rash involving mucus membranes or skin necrosis: No Has patient had a PCN reaction that required hospitalization: No Has patient had a PCN reaction occurring within the last 10 years: No If all of the above answers are "NO", t    Current Medications:   Current Outpatient Medications:  .  acetaminophen (TYLENOL) 500 MG tablet, Take 500 mg by mouth at bedtime. , Disp: , Rfl:  .  aspirin 81 MG tablet, Take 1 tablet (81 mg total) by mouth every evening., Disp: , Rfl:  .  carvedilol (COREG) 3.125 MG tablet, Take 1 tablet (3.125 mg total) by mouth 2 (two) times daily with a meal., Disp: 60 tablet, Rfl: 1 .  Cholecalciferol (VITAMIN D3) 2000 UNITS TABS, Take 2,000 Units by mouth daily., Disp: , Rfl:  .  ciprofloxacin (CIPRO) 250 MG tablet, Take 1 tablet (250 mg total) by mouth 2 (two) times daily., Disp: 6 tablet, Rfl: 0 .  folic acid (FOLVITE) 956 MCG tablet, Take 400 mcg by mouth daily., Disp: , Rfl:  .  furosemide (LASIX) 40 MG tablet, Take 40 mg by mouth daily as needed., Disp: , Rfl:  .  Glucosamine HCl  1500 MG TABS, Take 1,500 mg by mouth every evening. , Disp: , Rfl:  .  Multiple Vitamin (MULTIVITAMIN WITH MINERALS) TABS, Take 1 tablet by mouth daily. , Disp: , Rfl:  .  potassium chloride SA (KLOR-CON) 20 MEQ tablet, TAKE 1 TABLET BY MOUTH DAILY, GENERIC EQUIVALENT FOR KLOR-CON., Disp: 90 tablet, Rfl: 2 .  simvastatin (ZOCOR) 40 MG tablet, Take 0.5 tablets (20 mg total) by mouth at bedtime., Disp: 45 tablet, Rfl: 3 .  spironolactone (ALDACTONE) 25  MG tablet, Take 12.5 mg by mouth daily as needed., Disp: , Rfl:  .  tamsulosin (FLOMAX) 0.4 MG CAPS capsule, Take 0.4 mg by mouth at bedtime., Disp: , Rfl:  .  warfarin (COUMADIN) 5 MG tablet, Take 1/2 tablet daily except take 1 tablet on Sunday and Tuesday or Take as directed by anticoagulation clinic  90 day, Disp: 90 tablet, Rfl: 3 .  losartan (COZAAR) 25 MG tablet, Take 1 tablet (25 mg total) by mouth daily., Disp: 90 tablet, Rfl: 3 .  tiZANidine (ZANAFLEX) 2 MG tablet, Take 1 tablet (2 mg total) by mouth daily as needed for muscle spasms. (Patient not taking: Reported on 04/30/2020), Disp: 90 tablet, Rfl: 0   Review of Systems:   ROS  Negative unless otherwise specified per HPI.  Vitals:   Vitals:   04/30/20 1155 04/30/20 1228  BP: 106/70 90/60  Pulse: 92   Temp: 98.2 F (36.8 C)   TempSrc: Temporal   SpO2: 96%   Weight: 233 lb 6.1 oz (105.9 kg)   Height: 5\' 11"  (1.803 m)      Body mass index is 32.55 kg/m.  Physical Exam:   Physical Exam Vitals and nursing note reviewed.  Constitutional:      General: He is not in acute distress.    Appearance: He is well-developed. He is not ill-appearing or toxic-appearing.  Cardiovascular:     Rate and Rhythm: Normal rate and regular rhythm.     Pulses: Normal pulses.     Heart sounds: Normal heart sounds, S1 normal and S2 normal.     Comments: No LE edema Pulmonary:     Effort: Pulmonary effort is normal.     Breath sounds: Normal breath sounds.  Musculoskeletal:     Comments: TTP  R anterior mid thigh  Slight decreased ROM of R flexion of leg due to pain at hip joint  Normal ROM of knee  Skin:    General: Skin is warm and dry.     Comments: B/l knees with superficial well healing abrasions, L>R; no TTP or purulent discharge  Neurological:     Mental Status: He is alert.     GCS: GCS eye subscore is 4. GCS verbal subscore is 5. GCS motor subscore is 6.  Psychiatric:        Speech: Speech normal.        Behavior: Behavior normal. Behavior is cooperative.     Results for orders placed or performed in visit on 04/30/20  POCT Urinalysis Dipstick (Automated)  Result Value Ref Range   Color, UA orange    Clarity, UA cloudy    Glucose, UA Negative Negative   Bilirubin, UA neg    Ketones, UA neg    Spec Grav, UA 1.020 1.010 - 1.025   Blood, UA positive 3+    pH, UA 5.5 5.0 - 8.0   Protein, UA Positive (A) Negative   Urobilinogen, UA 0.2 0.2 or 1.0 E.U./dL   Nitrite, UA positve    Leukocytes, UA Moderate (2+) (A) Negative    Assessment and Plan:   Joseph Malone was seen today for fall.  Diagnoses and all orders for this visit:  Weakness; Urine leukocytes Hypotension today. Continue Losartan 25 mg and decrease Coreg to 3.125 mg BID. Follow-up with Dr. Lovena Le given inability to recheck BP at home -- recommend calling his office and scheduling appointment after leaving here. Update blood work today. UA concerning for UTI -- also start cipro 250 mg BID. Culture pending.  PCP contacted about his hematuria, plans to follow-up on this for recheck in 2 weeks. Worsening precautions advised in the interim. -     CBC with Differential/Platelet -     Comprehensive metabolic panel -     POCT Urinalysis Dipstick (Automated)  -     Urine Culture  Right thigh pain; Right hip pain Given persistent pain after fall, will obtain xrays. If normal, will refer to PT. If abnormal, will refer accordingly. Recommend use of cane or walker to prevent further falls until pain/weaknes  improved. -     DG FEMUR, MIN 2 VIEWS RIGHT; Future -     DG HIP UNILAT W OR W/O PELVIS 2-3 VIEWS RIGHT; Future  Abrasion of knee, unspecified laterality, initial encounter Appears to be healing well. No evidence of infection. Continue current wound care. Follow-up as needed.  Other orders -     carvedilol (COREG) 3.125 MG tablet; Take 1 tablet (3.125 mg total) by mouth 2 (two) times daily with a meal. -     ciprofloxacin (CIPRO) 250 MG tablet; Take 1 tablet (250 mg total) by mouth 2 (two) times daily.   CMA or LPN served as scribe during this visit. History, Physical, and Plan performed by medical provider. The above documentation has been reviewed and is accurate and complete.  Time spent with patient today was 30 minutes which consisted of chart review, discussing diagnosis, work up, treatment answering questions and documentation.   Inda Coke, PA-C

## 2020-04-30 NOTE — Patient Instructions (Signed)
It was great to see you!  Decrease Coreg to 3.125 mg twice daily. I have sent this in. Continue Losartan 25 mg daily.  Please call Dr. Tanna Furry office to schedule follow-up on your blood pressure.  I will be in touch with your xray results.  I recommend getting a cane or walker to prevent falls.  Take care,  Inda Coke PA-C

## 2020-04-30 NOTE — Telephone Encounter (Addendum)
Patient has an appt. on Wednesday   Caller states he fell on Friday night when getting out of bed. He is still weak with sore legs and arms. He wants to make an appointment for as soon as possible.

## 2020-04-30 NOTE — Addendum Note (Signed)
Addended by: Linton Ham on: 04/30/2020 02:31 PM   Modules accepted: Orders

## 2020-04-30 NOTE — Telephone Encounter (Signed)
Noted  

## 2020-05-02 ENCOUNTER — Ambulatory Visit (INDEPENDENT_AMBULATORY_CARE_PROVIDER_SITE_OTHER): Payer: Medicare Other | Admitting: General Practice

## 2020-05-02 ENCOUNTER — Other Ambulatory Visit: Payer: Self-pay

## 2020-05-02 DIAGNOSIS — Z7901 Long term (current) use of anticoagulants: Secondary | ICD-10-CM

## 2020-05-02 LAB — URINE CULTURE
MICRO NUMBER:: 11671237
SPECIMEN QUALITY:: ADEQUATE

## 2020-05-02 LAB — POCT INR: INR: 3.3 — AB (ref 2.0–3.0)

## 2020-05-02 NOTE — Patient Instructions (Addendum)
Pre visit review using our clinic review tool, if applicable. No additional management support is needed unless otherwise documented below in the visit note.  Skip coumadin today (3/23) and take 1/2 tablet tomorrow (3/24).  On Friday continue to  take 1/2 tablet daily except 1 tablet on Sundays and Thursdays.  Re-check in 4 weeks.

## 2020-05-02 NOTE — Progress Notes (Signed)
I have reviewed and agree with note, evaluation, plan.   Palmer Fahrner, MD  

## 2020-05-16 ENCOUNTER — Telehealth: Payer: Self-pay

## 2020-05-16 NOTE — Telephone Encounter (Signed)
Pt's blood pressure is 112/80, 118/50,100/59. Pulse has been 100, 108, 112. These readings were done today and last night. Pt is wanting to know if he should stop taking his blood pressure pills. He is feeling very weak. Pt is dizzy upon standing.  Pt was exhausted after walking 20 mins at the park. Again, pt is wanting to know if he should stop taking BP medication    Pt is taking Coreg 3.125 mg 2x daily, Zocor 20 mg at night, and Cozaar 12.25 mg at night.

## 2020-05-16 NOTE — Telephone Encounter (Signed)
Please schedule virtual for pt to discuss with dr. Yong Channel.

## 2020-05-17 ENCOUNTER — Other Ambulatory Visit: Payer: Self-pay

## 2020-05-17 ENCOUNTER — Ambulatory Visit (INDEPENDENT_AMBULATORY_CARE_PROVIDER_SITE_OTHER): Payer: Medicare Other

## 2020-05-17 ENCOUNTER — Telehealth (HOSPITAL_COMMUNITY): Payer: Self-pay | Admitting: Vascular Surgery

## 2020-05-17 ENCOUNTER — Ambulatory Visit (INDEPENDENT_AMBULATORY_CARE_PROVIDER_SITE_OTHER): Payer: Medicare Other | Admitting: Family Medicine

## 2020-05-17 ENCOUNTER — Encounter: Payer: Self-pay | Admitting: Family Medicine

## 2020-05-17 VITALS — BP 118/64 | HR 95 | Temp 97.3°F | Wt 236.8 lb

## 2020-05-17 DIAGNOSIS — E1151 Type 2 diabetes mellitus with diabetic peripheral angiopathy without gangrene: Secondary | ICD-10-CM

## 2020-05-17 DIAGNOSIS — N3000 Acute cystitis without hematuria: Secondary | ICD-10-CM

## 2020-05-17 DIAGNOSIS — R319 Hematuria, unspecified: Secondary | ICD-10-CM

## 2020-05-17 DIAGNOSIS — R002 Palpitations: Secondary | ICD-10-CM

## 2020-05-17 DIAGNOSIS — I2510 Atherosclerotic heart disease of native coronary artery without angina pectoris: Secondary | ICD-10-CM

## 2020-05-17 DIAGNOSIS — R0602 Shortness of breath: Secondary | ICD-10-CM | POA: Diagnosis not present

## 2020-05-17 DIAGNOSIS — I5022 Chronic systolic (congestive) heart failure: Secondary | ICD-10-CM

## 2020-05-17 DIAGNOSIS — I152 Hypertension secondary to endocrine disorders: Secondary | ICD-10-CM | POA: Diagnosis not present

## 2020-05-17 DIAGNOSIS — R634 Abnormal weight loss: Secondary | ICD-10-CM

## 2020-05-17 DIAGNOSIS — N3001 Acute cystitis with hematuria: Secondary | ICD-10-CM | POA: Diagnosis not present

## 2020-05-17 DIAGNOSIS — E1159 Type 2 diabetes mellitus with other circulatory complications: Secondary | ICD-10-CM | POA: Diagnosis not present

## 2020-05-17 LAB — POC URINALSYSI DIPSTICK (AUTOMATED)
Bilirubin, UA: NEGATIVE
Glucose, UA: NEGATIVE
Ketones, UA: NEGATIVE
Leukocytes, UA: NEGATIVE
Nitrite, UA: POSITIVE
Protein, UA: NEGATIVE
Spec Grav, UA: >=1.03 — AB (ref 1.010–1.025)
Urobilinogen, UA: 0.2 E.U./dL
pH, UA: 5.5 (ref 5.0–8.0)

## 2020-05-17 LAB — COMPREHENSIVE METABOLIC PANEL
ALT: 20 U/L (ref 0–53)
AST: 19 U/L (ref 0–37)
Albumin: 4 g/dL (ref 3.5–5.2)
Alkaline Phosphatase: 61 U/L (ref 39–117)
BUN: 24 mg/dL — ABNORMAL HIGH (ref 6–23)
CO2: 29 mEq/L (ref 19–32)
Calcium: 9.3 mg/dL (ref 8.4–10.5)
Chloride: 105 mEq/L (ref 96–112)
Creatinine, Ser: 1.26 mg/dL (ref 0.40–1.50)
GFR: 51.01 mL/min — ABNORMAL LOW (ref >60.00)
Glucose, Bld: 200 mg/dL — ABNORMAL HIGH (ref 70–99)
Potassium: 4.1 mEq/L (ref 3.5–5.1)
Sodium: 142 mEq/L (ref 135–145)
Total Bilirubin: 0.4 mg/dL (ref 0.2–1.2)
Total Protein: 6.7 g/dL (ref 6.0–8.3)

## 2020-05-17 LAB — CBC WITH DIFFERENTIAL/PLATELET
Basophils Absolute: 0.1 10*3/uL (ref 0.0–0.1)
Basophils Relative: 1.8 % (ref 0.0–3.0)
Eosinophils Absolute: 0.3 10*3/uL (ref 0.0–0.7)
Eosinophils Relative: 4 % (ref 0.0–5.0)
HCT: 42.6 % (ref 39.0–52.0)
Hemoglobin: 14.3 g/dL (ref 13.0–17.0)
Lymphocytes Relative: 34.7 % (ref 12.0–46.0)
Lymphs Abs: 2.4 10*3/uL (ref 0.7–4.0)
MCHC: 33.6 g/dL (ref 30.0–36.0)
MCV: 92.8 fl (ref 78.0–100.0)
Monocytes Absolute: 0.6 10*3/uL (ref 0.1–1.0)
Monocytes Relative: 8.6 % (ref 3.0–12.0)
Neutro Abs: 3.5 10*3/uL (ref 1.4–7.7)
Neutrophils Relative %: 50.9 % (ref 43.0–77.0)
Platelets: 201 10*3/uL (ref 150.0–400.0)
RBC: 4.59 Mil/uL (ref 4.22–5.81)
RDW: 12.9 % (ref 11.5–15.5)
WBC: 6.9 10*3/uL (ref 4.0–10.5)

## 2020-05-17 LAB — MICROALBUMIN / CREATININE URINE RATIO
Creatinine,U: 162.6 mg/dL
Microalb Creat Ratio: 0.8 mg/g (ref 0.0–30.0)
Microalb, Ur: 1.3 mg/dL (ref 0.0–1.9)

## 2020-05-17 LAB — URINALYSIS, MICROSCOPIC ONLY: RBC / HPF: NONE SEEN (ref 0–?)

## 2020-05-17 LAB — TSH: TSH: 0.76 u[IU]/mL (ref 0.35–4.50)

## 2020-05-17 NOTE — Telephone Encounter (Signed)
Discussed w/Dr Bensimhon, will have pt come in tomorrow for RN visit to check Optivol and place 7 day Zio monitor. Pt aware and agreeable, appt sch for 4/8 10 am

## 2020-05-17 NOTE — Progress Notes (Signed)
Phone [phone] In person visit   Subjective:   Joseph Malone is a 87 y.o. year old very pleasant male patient who presents for/with See problem oriented charting Chief Complaint  Patient presents with  . Hypotension    Has been running low since April 4th, 121/55 112/58 reading pulses have been running high he thinks. Between 70 and 102 pulses, feels heart has been racing. Did not take medications yesterday due to low pressures.  . Bruises    Still has some bruises from a fall about 10 days ago, was seen by PA but still has bruising he is concerned about.    This visit occurred during the SARS-CoV-2 public health emergency.  Safety protocols were in place, including screening questions prior to the visit, additional usage of staff PPE, and extensive cleaning of exam room while observing appropriate contact time as indicated for disinfecting solutions.   Past Medical History-  Patient Active Problem List   Diagnosis Date Noted  . Chronic systolic heart failure ([place]) 12/09/2010    Priority: High  . Cardiomyopathy, ischemic 10/02/2009    Priority: High  . Automatic implantable cardioverter-defibrillator in situ 01/16/2009    Priority: High  . DM (diabetes mellitus) type II, controlled, with peripheral vascular disorder ([place]) 09/14/2008    Priority: High  . CAD in native artery 09/25/2006    Priority: High  . Nephrolithiasis 03/24/2016    Priority: Medium  . Hyperlipidemia associated with type 2 diabetes mellitus ([place]) 04/22/2007    Priority: Medium  . Hypertension associated with diabetes ([place]) 09/25/2006    Priority: Medium  . Long term (current) use of anticoagulants 07/08/2017    Priority: Low  . Erectile dysfunction 01/19/2014    Priority: Low  . Basal cell cancer 09/29/2012    Priority: Low  . Solitary pulmonary nodule 07/12/2012    Priority: Low  . BPH without obstruction/lower urinary tract symptoms 10/22/2007    Priority: Low  . Osteoarthritis 09/25/2006     Priority: Low  . Hematuria 09/25/2006    Priority: Low  . PVC's (premature ventricular contractions) 01/24/2020  . Melena     Medications- reviewed and updated Current Outpatient Medications  Medication Sig Dispense Refill  . acetaminophen (TYLENOL) 500 MG tablet Take 500 mg by mouth at bedtime.     Marland Kitchen aspirin 81 MG tablet Take 1 tablet (81 mg total) by mouth every evening.    . carvedilol (COREG) 3.125 MG tablet TAKE 1 TABLET(3.125 MG) BY MOUTH TWICE DAILY WITH A MEAL 180 tablet 0  . Cholecalciferol (VITAMIN D3) 2000 UNITS TABS Take 2,000 Units by mouth daily.    . ciprofloxacin (CIPRO) 250 MG tablet Take 1 tablet (250 mg total) by mouth 2 (two) times daily. 6 tablet 0  . folic acid (FOLVITE) 119 MCG tablet Take 400 mcg by mouth daily.    . furosemide (LASIX) 40 MG tablet Take 40 mg by mouth daily as needed.    . Glucosamine HCl 1500 MG TABS Take 1,500 mg by mouth every evening.     . Multiple Vitamin (MULTIVITAMIN WITH MINERALS) TABS Take 1 tablet by mouth daily.     . potassium chloride SA (KLOR-CON) 20 MEQ tablet TAKE 1 TABLET BY MOUTH DAILY, GENERIC EQUIVALENT FOR KLOR-CON. 90 tablet 2  . simvastatin (ZOCOR) 40 MG tablet Take 0.5 tablets (20 mg total) by mouth at bedtime. 45 tablet 3  . spironolactone (ALDACTONE) 25 MG tablet Take 12.5 mg by mouth daily as needed.    Marland Kitchen  tamsulosin (FLOMAX) 0.4 MG CAPS capsule Take 0.4 mg by mouth at bedtime.    Marland Kitchen tiZANidine (ZANAFLEX) 2 MG tablet Take 1 tablet (2 mg total) by mouth daily as needed for muscle spasms. 90 tablet 0  . warfarin (COUMADIN) 5 MG tablet Take 1/2 tablet daily except take 1 tablet on Sunday and Tuesday or Take as directed by anticoagulation clinic  90 day 90 tablet 3  . losartan (COZAAR) 25 MG tablet Take 1 tablet (25 mg total) by mouth daily. 90 tablet 3   No current facility-administered medications for this visit.     Objective:  BP 118/64   Pulse 95   Temp (!) 97.3 F (36.3 C) (Temporal)   Wt 236 lb 12.8 oz (107.4 kg)    SpO2 93%   BMI 33.03 kg/m  Gen: NAD, resting comfortably CV: RRR no murmurs rubs or gallops Lungs: CTAB no crackles, wheeze, rhonchi Ext: no edema Skin: warm, dry    EKG: sinus rhythm with rate 95, normal axis, normal intervals, no hypertrophy, q waves in v1, v2, otherwise no st or t wave changes - movement in ii- iii- ekg nto repeated     Assessment and Plan   #Fall-patient fell several weeks ago (getting up to go to bathroom early in AM - felt weak and feet slipped forward and then fall onto buttocks- ended up with UTI that may have caused weakness). He crawled into bathroom and pulled up onto tub- had some bruising around defibrillator. No other lingering injuries other than skinned knees which are improving.  -Of note blood pressure was low at that time at 90/60 and carvedilol was decreased to 3.125 mg BID - was having some burning with urination slightly last time but this time having no symptoms  #Heart failure with reduced ejection fraction/ ischemic cardiomypopathy- Dr. Haroldine Laws S: Medication:Carvedilol 6.25--> 3.125 mg recently mg twice daily, Lasix 40 mg as needed (not needed lately), losartan 25 mg daily, spironolactone 12.5 mg as needed- not needed lately  Edema: none Weight gain:none Shortness of breath: mild with exertion. No worsening chest pain A/P: heart failure appears stable with weight actually down on home scales- continue current medicine   #CAD with defibrillator in place/hyperlipidemia #statin myalgia on higher doser #Apical thrombus-on Coumadin through cardiology clinic S: Medication:Aspirin 81 mg, simvastatin 40 mg  A/P: CAD without recent increase in chest pain. Some increased shortness of breath for 4-5 days. Continue current meds for now until sees cardiology. He has been pretty stable and I do not think we have to send to hospital at this point but I wonder if shortness of breath could be related to underlying CAD.   #hypertension   #Palpitations/irregular heart rate S: medication: Carvedilol 6.25 mg twice daily (but he reports was takign 1/4 of 25 mg twice a day)--> recently changed to 3.125 mg daily, losartan 25 mg (stopped losartan yesterday for first time along with carvedilol when noted BP 100/50- but took carvedilol this morning).  Lasix and spironolactone as needed- hasnt taken recently  Home readings #s: Patient reports blood pressures have been running lower since April 4-include some numbers including 121/55, 112/58 before getting lower yeserday as above.  He has also noted his pulse running higher and having some palpitations-up to 112 heart rate starting on the 4th.  He did not take any of his medications yesterday as a result  -felt increased weakness and dizziness walking through park yesterday even with holding BP meds -did take carvedilol this AM -He has  diabetes but denies any recent low blood sugars  -Patient with history of melena in the past A/P: Blood pressure running  Lower and heart rate running higher just in last few days along with some shortness of breath with activity. I am going to have him hold his losartan for now and continue carvedilol 3.125 mg twice daily - I am going to ask my team to get him set up with cardiology by next week. For their perspective on BP change and have them look at defibrillator -update chest x-ray today - not sure why blood pressure is running lower at this time -With palpitations we will get an EKG today-results show no obvious cause for his symptoms  -With low blood pressures we will update blood work including CBC and CMP. Has had some weight loss- check tsh   Recommended follow up: try to get close cardiology follow u p Future Appointments  Date Time Provider [place]  06/13/2020 10:30 AM [facility]  06/27/2020  8:00 AM [facility] DEVICE REMOTES [facility]  09/03/2020  1:00 PM [facility] HEALTH COACH [facility]   09/06/2020 10:40 AM Marin Olp, MD [facility]  09/26/2020  8:00 AM [facility] DEVICE REMOTES [facility]  12/26/2020  8:00 AM [facility] DEVICE REMOTES [facility]  03/27/2021  8:00 AM [facility] DEVICE REMOTES [facility]  06/26/2021  8:00 AM [facility] DEV[facility] REMOTES [facility]   Lab/Order associations:   ICD-10-CM   1. Palpitations  R00.2 EKG 12-Lead  2. Hypertension associated with diabetes ([place])  E11.59 CBC with Differential/Platelet   I15.2 Comprehensive metabolic panel  3. Weight loss  R63.4 TSH  4. Shortness of breath  R06.02 DG Chest 2 View  5. DM (diabetes mellitus) type II, controlled, with peripheral vascular disorder ([facility])  E11.51 Microalbumin / creatinine urine ratio  6. Acute cystitis without hematuria  N30.00 POCT Urinalysis Dipstick (Automated)    No orders of the defined types were placed in this encounter.  Return precautions advised.  Garret Reddish, MD

## 2020-05-17 NOTE — Telephone Encounter (Signed)
LVM for pt to get scheduled for a phone call

## 2020-05-17 NOTE — Addendum Note (Signed)
Addended by: Jacob Moores on: 05/17/2020 01:03 PM   Modules accepted: Orders

## 2020-05-17 NOTE — Patient Instructions (Addendum)
Blood pressure running  Lower and heart rate running higher just in last few days along with some shortness of breath with activity. I am going to have him hold his losartan for now and continue carvedilol 3.125 mg twice daily - I am going to ask my team to get him set up with cardiology by next week with CHF clinic  If new or worsening symptoms please seek care at hospital particularly worsening shortness of breath or chest pain  Asked him to take it easy until he sees cardiology  Please stop by lab and x-ray before you go If you have mychart- we will send your results within 3 business days of [place] receiving them.  If you do not have mychart- we will call you about results within 5 business days of [place] receiving them.  *please also note that you will see labs on mychart as soon as they post. I will later go in and write notes on them- will say "notes from Dr. Yong Channel"  Recommended follow up: close cardiology follow up

## 2020-05-17 NOTE — Addendum Note (Signed)
Addended by: Jacob Moores on: 05/17/2020 10:11 AM   Modules accepted: Orders

## 2020-05-17 NOTE — Telephone Encounter (Signed)
Pt saw pcp today, the office called to get pt appt for next week w/ DB for low bp and heart palpation.. please advise

## 2020-05-17 NOTE — Telephone Encounter (Signed)
No appointments next week. Heather please advise.

## 2020-05-18 ENCOUNTER — Other Ambulatory Visit (HOSPITAL_COMMUNITY): Payer: Self-pay | Admitting: Internal Medicine

## 2020-05-18 ENCOUNTER — Ambulatory Visit (HOSPITAL_COMMUNITY)
Admission: RE | Admit: 2020-05-18 | Discharge: 2020-05-18 | Disposition: A | Discharge status: Home / Self Care | Payer: Medicare Other | Source: Ambulatory Visit | Attending: Cardiology | Admitting: Cardiology

## 2020-05-18 ENCOUNTER — Encounter: Payer: Self-pay | Admitting: Family Medicine

## 2020-05-18 ENCOUNTER — Encounter (HOSPITAL_COMMUNITY): Payer: Self-pay | Admitting: Internal Medicine

## 2020-05-18 ENCOUNTER — Ambulatory Visit (HOSPITAL_COMMUNITY)
Admission: RE | Admit: 2020-05-18 | Discharge: 2020-05-18 | Disposition: A | Discharge status: Home / Self Care | Payer: Medicare Other | Source: Ambulatory Visit | Attending: Internal Medicine | Admitting: Internal Medicine

## 2020-05-18 DIAGNOSIS — R000 Tachycardia, unspecified: Secondary | ICD-10-CM

## 2020-05-18 DIAGNOSIS — I700 Atherosclerosis of aorta: Secondary | ICD-10-CM | POA: Insufficient documentation

## 2020-05-18 NOTE — Progress Notes (Signed)
Patient was seen in office for bp check, zio patch, and optivol check. Vitals normal.  Per Adline Potter optivol is normal.  Zio patch placed for 7 days.

## 2020-05-19 LAB — URINE CULTURE
MICRO NUMBER:: 11743462
SPECIMEN QUALITY:: ADEQUATE

## 2020-05-22 ENCOUNTER — Other Ambulatory Visit: Payer: Self-pay

## 2020-05-22 MED ORDER — CEPHALEXIN 500 MG PO CAPS: 500.0000 mg | ORAL_CAPSULE | Freq: Three times a day (TID) | ORAL | 0 refills | Status: DC

## 2020-05-29 DIAGNOSIS — R000 Tachycardia, unspecified: Secondary | ICD-10-CM | POA: Diagnosis not present

## 2020-06-04 ENCOUNTER — Telehealth: Payer: Self-pay

## 2020-06-04 NOTE — Telephone Encounter (Signed)
Patient is scheduled   

## 2020-06-04 NOTE — Patient Instructions (Addendum)
Health Maintenance Due  Topic Date Due  . TETANUS/TDAP - recommend at the pharmacy 09/18/2019  . OPHTHALMOLOGY EXAM Sign a release form at check out.  05/11/2020   Continue current meds for now since you are feeling better. If you could double check that you are taking full losartan 25 mg and a quarter pill of 25mg  carvedilol twice daily that would be great- can send mychart message  Recommended follow up: lets keep our July visit unless you need [place] sooner but glad blood pressure is better and you are feeling better

## 2020-06-04 NOTE — Telephone Encounter (Signed)
Unsure of who called, nothing documented. Ok to schedule OV if he wants to discuss BP.

## 2020-06-04 NOTE — Progress Notes (Signed)
Phone [phone] In person visit   Subjective:   Joseph Malone is a 87 y.o. year old very pleasant male patient who presents for/with See problem oriented charting Chief Complaint  Patient presents with  . Low BP    This visit occurred during the SARS-CoV-2 public health emergency.  Safety protocols were in place, including screening questions prior to the visit, additional usage of staff PPE, and extensive cleaning of exam room while observing appropriate contact time as indicated for disinfecting solutions.   Past Medical History-  Patient Active Problem List   Diagnosis Date Noted  . Chronic systolic heart failure ([place]) 12/09/2010    Priority: High  . Cardiomyopathy, ischemic 10/02/2009    Priority: High  . Automatic implantable cardioverter-defibrillator in situ 01/16/2009    Priority: High  . DM (diabetes mellitus) type II, controlled, with peripheral vascular disorder ([place]) 09/14/2008    Priority: High  . CAD in native artery 09/25/2006    Priority: High  . Atherosclerosis of aorta ([place]) 05/18/2020    Priority: Medium  . Nephrolithiasis 03/24/2016    Priority: Medium  . Hyperlipidemia associated with type 2 diabetes mellitus ([place]) 04/22/2007    Priority: Medium  . Hypertension associated with diabetes ([place]) 09/25/2006    Priority: Medium  . Long term (current) use of anticoagulants 07/08/2017    Priority: Low  . Erectile dysfunction 01/19/2014    Priority: Low  . Basal cell cancer 09/29/2012    Priority: Low  . Solitary pulmonary nodule 07/12/2012    Priority: Low  . BPH without obstruction/lower urinary tract symptoms 10/22/2007    Priority: Low  . Osteoarthritis 09/25/2006    Priority: Low  . Hematuria 09/25/2006    Priority: Low  . PVC's (premature ventricular contractions) 01/24/2020  . Melena     Medications- reviewed and updated Current Outpatient Medications  Medication Sig Dispense Refill  . acetaminophen (TYLENOL) 500 MG tablet Take 500 mg by  mouth at bedtime.     Marland Kitchen aspirin 81 MG tablet Take 1 tablet (81 mg total) by mouth every evening.    . carvedilol (COREG) 3.125 MG tablet TAKE 1 TABLET(3.125 MG) BY MOUTH TWICE DAILY WITH A MEAL 180 tablet 0  . Cholecalciferol (VITAMIN D3) 2000 UNITS TABS Take 2,000 Units by mouth daily.    . folic acid (FOLVITE) 093 MCG tablet Take 400 mcg by mouth daily.    . furosemide (LASIX) 40 MG tablet Take 40 mg by mouth daily as needed.    . Glucosamine HCl 1500 MG TABS Take 1,500 mg by mouth every evening.     Marland Kitchen losartan (COZAAR) 25 MG tablet Take 1 tablet (25 mg total) by mouth daily. 90 tablet 3  . Multiple Vitamin (MULTIVITAMIN WITH MINERALS) TABS Take 1 tablet by mouth daily.     . potassium chloride SA (KLOR-CON) 20 MEQ tablet TAKE 1 TABLET BY MOUTH DAILY, GENERIC EQUIVALENT FOR KLOR-CON. 90 tablet 2  . simvastatin (ZOCOR) 40 MG tablet Take 0.5 tablets (20 mg total) by mouth at bedtime. 45 tablet 3  . spironolactone (ALDACTONE) 25 MG tablet Take 12.5 mg by mouth daily as needed.    . tamsulosin (FLOMAX) 0.4 MG CAPS capsule Take 0.4 mg by mouth at bedtime.    Marland Kitchen warfarin (COUMADIN) 5 MG tablet Take 1/2 tablet daily except take 1 tablet on Sunday and Tuesday or Take as directed by anticoagulation clinic  90 day 90 tablet 3  . tiZANidine (ZANAFLEX) 2 MG tablet Take 1 tablet (2  mg total) by mouth daily as needed for muscle spasms (do not drive for 8 hours after use). 90 tablet 0   No current facility-administered medications for this visit.     Objective:  BP 134/84   Pulse 78   Temp 97.6 F (36.4 C) (Temporal)   Ht 5\' 11"  (1.803 m)   Wt 242 lb (109.8 kg)   SpO2 94%   BMI 33.75 kg/m  Gen: NAD, resting comfortably CV: RRR no murmurs rubs or gallops Lungs: CTAB no crackles, wheeze, rhonchi Abdomen: soft/nontender/nondistended/normal bowel sounds. No rebound or guarding.  Ext: no edema Skin: warm, dry    Assessment and Plan   #hypertension- low blood pressure at last visit #palpitations-  last he experienced was prior to last visit and before monitor S: medication: coreg 3.125 mg BID (but may be 6.25 mg - sounds like may be taking quarter of 25mg  tablet as had issues with fill at pharmacy) lasix 40 mg as needed (reports has not needed lately), losartan 25 mg on hold at last visit - he has restarted this, spironolactone 12.5 mg as needed- has not needed  -last visit patient dealing with lower blood pressures- we opted to hold losartan and decrease coreg to 3.125 mg BID. He states symptoms essentially resolved after visit. No palpitaitons since last visit - encouraged cardiology follow up- cardiology was contacted and Dr. Haroldine Laws recommended 7 day zio monitor and did a blood pressure check - zio patch showed sinus rhythm with 3 runs of nonsustained v tach- longest 4 beats, 11% PACs, 3 % PVCs  Home readings #s: 130/74 or so- last checked - walking 3-4 x a day BP Readings from Last 3 Encounters:  06/07/20 134/84  05/18/20 126/74  05/17/20 118/64  A/P: Patient was experiencing low blood pressures and palpitations the last visit- we reduced his carvedilol to 3.125 mg twice daily and held losartan short-term.  He states he quickly began to feel better-he actually has restarted losartan and continues to feel well with that.  Thrilled he is doing better.  Also palpitations have resolved-does have occasional PVCs which could be a cause- does not appear cardiology was concerned about the short runs of nonsustained V. tach-we will continue current medicine for now - he can restart his exercise- was on pause with zio patch per cardiology- if he has new issues should let us know- could potentially hold losartan on days blood pressure running lower or we could do a 12.5mg  dose if needed I nfuture  #tizanidine helpful for muscle spasms in the legs and sometimes the back- mainly uses at night - refilled today- no falls with this medicine. If has this may need to stop medicine  Recommended follow  up: lets keep our July visit unless you need [place] sooner but glad blood pressure is better and you are feeling better Future Appointments  Date Time Provider [place]  06/13/2020 10:30 AM [facility]  06/27/2020  8:00 AM [facility] DEVICE REMOT[facility]  09/03/2020  1:00 PM [facility] HEALTH COACH [facility]  09/06/2020 10:40 AM Marin Olp, MD [facility]  09/26/2020  8:00 AM [facility] DEVICE REMOTES [facility]  12/26/2020  8:00 AM [facility] DEV[facility] REMOT[facility]  03/27/2021  8:00 AM [facility]  06/26/2021  8:00 AM [facility]    Lab/Order associations:   ICD-10-CM   1. Hypertension associated with diabetes ([place])  E11.59    I15.2   2. Palpitations  R00.2    Meds ordered this encounter  Medications  . tiZANidine (ZANAFLEX) 2 MG tablet    Sig: Take 1 tablet (2 mg total) by mouth daily as needed for muscle spasms (do not drive for 8 hours after use).    Dispense:  90 tablet    Refill:  0   Return precautions advised.  Garret Reddish, MD

## 2020-06-04 NOTE — Telephone Encounter (Signed)
Initial Comment Caller is returning a call about setting up an appt. with Dr. Yong Channel. He is unsure exactly what this is regarding. Thinks it may have been about blood pressure.

## 2020-06-07 ENCOUNTER — Other Ambulatory Visit: Payer: Self-pay

## 2020-06-07 ENCOUNTER — Encounter: Payer: Self-pay | Admitting: Family Medicine

## 2020-06-07 ENCOUNTER — Ambulatory Visit (INDEPENDENT_AMBULATORY_CARE_PROVIDER_SITE_OTHER): Payer: Medicare Other | Admitting: Family Medicine

## 2020-06-07 VITALS — BP 134/84 | HR 78 | Temp 97.6°F | Ht 71.0 in | Wt 242.0 lb

## 2020-06-07 DIAGNOSIS — E1159 Type 2 diabetes mellitus with other circulatory complications: Secondary | ICD-10-CM | POA: Diagnosis not present

## 2020-06-07 DIAGNOSIS — I152 Hypertension secondary to endocrine disorders: Secondary | ICD-10-CM

## 2020-06-07 DIAGNOSIS — R002 Palpitations: Secondary | ICD-10-CM

## 2020-06-07 MED ORDER — TIZANIDINE HCL 2 MG PO TABS: 2.0000 mg | ORAL_TABLET | Freq: Every day | ORAL | 0 refills | Status: DC | PRN

## 2020-06-13 ENCOUNTER — Other Ambulatory Visit: Payer: Self-pay

## 2020-06-13 ENCOUNTER — Ambulatory Visit (INDEPENDENT_AMBULATORY_CARE_PROVIDER_SITE_OTHER): Payer: Medicare Other | Admitting: General Practice

## 2020-06-13 DIAGNOSIS — Z7901 Long term (current) use of anticoagulants: Secondary | ICD-10-CM | POA: Diagnosis not present

## 2020-06-13 LAB — POCT INR: INR: 2 (ref 2.0–3.0)

## 2020-06-13 NOTE — Patient Instructions (Addendum)
Pre visit review using our clinic review tool, if applicable. No additional management support is needed unless otherwise documented below in the visit note.  Continue to  take 1/2 tablet daily except 1 tablet on Sundays and Thursdays.  Re-check in 6 weeks.

## 2020-06-13 NOTE — Progress Notes (Signed)
I have reviewed and agree with note, evaluation, plan.   Jame Seelig, MD  

## 2020-06-21 DIAGNOSIS — S60511S Abrasion of right hand, sequela: Secondary | ICD-10-CM | POA: Diagnosis not present

## 2020-06-21 DIAGNOSIS — W19XXXA Unspecified fall, initial encounter: Secondary | ICD-10-CM | POA: Diagnosis not present

## 2020-06-22 ENCOUNTER — Telehealth: Payer: Self-pay

## 2020-06-22 ENCOUNTER — Other Ambulatory Visit (HOSPITAL_COMMUNITY): Payer: Self-pay

## 2020-06-22 MED ORDER — POTASSIUM CHLORIDE CRYS ER 20 MEQ PO TBCR: EXTENDED_RELEASE_TABLET | ORAL | 2 refills | Status: DC

## 2020-06-22 NOTE — Telephone Encounter (Signed)
You can use a same day for a ED follow up

## 2020-06-22 NOTE — Telephone Encounter (Signed)
Patient called in stating that he is needing a follow up visit from the ED from a fall the other day, please advise on where to scheduled patient as no openings are available until late June.

## 2020-06-25 NOTE — Telephone Encounter (Signed)
Patient is scheduled   

## 2020-06-25 NOTE — Progress Notes (Signed)
Phone [phone] In person visit   Subjective:   Joseph Malone is a 87 y.o. year old very pleasant male patient who presents for/with See problem oriented charting Chief Complaint  Patient presents with  . Fall    Patient fell, skinned his hands and knee up.    This visit occurred during the SARS-CoV-2 public health emergency.  Safety protocols were in place, including screening questions prior to the visit, additional usage of staff PPE, and extensive cleaning of exam room while observing appropriate contact time as indicated for disinfecting solutions.   Past Medical History-  Patient Active Problem List   Diagnosis Date Noted  . Chronic systolic heart failure ([place]) 12/09/2010    Priority: High  . Cardiomyopathy, ischemic 10/02/2009    Priority: High  . Automatic implantable cardioverter-defibrillator in situ 01/16/2009    Priority: High  . DM (diabetes mellitus) type II, controlled, with peripheral vascular disorder ([place]) 09/14/2008    Priority: High  . CAD in native artery 09/25/2006    Priority: High  . Atherosclerosis of aorta ([place]) 05/18/2020    Priority: Medium  . Nephrolithiasis 03/24/2016    Priority: Medium  . Hyperlipidemia associated with type 2 diabetes mellitus ([place]) 04/22/2007    Priority: Medium  . Hypertension associated with diabetes ([place]) 09/25/2006    Priority: Medium  . Long term (current) use of anticoagulants 07/08/2017    Priority: Low  . Erectile dysfunction 01/19/2014    Priority: Low  . Basal cell cancer 09/29/2012    Priority: Low  . Solitary pulmonary nodule 07/12/2012    Priority: Low  . BPH without obstruction/lower urinary tract symptoms 10/22/2007    Priority: Low  . Osteoarthritis 09/25/2006    Priority: Low  . Hematuria 09/25/2006    Priority: Low  . PVC's (premature ventricular contractions) 01/24/2020  . Melena     Medications- reviewed and updated Current Outpatient Medications  Medication Sig Dispense Refill  .  acetaminophen (TYLENOL) 500 MG tablet Take 500 mg by mouth at bedtime.     Marland Kitchen aspirin 81 MG tablet Take 1 tablet (81 mg total) by mouth every evening.    . carvedilol (COREG) 3.125 MG tablet TAKE 1 TABLET(3.125 MG) BY MOUTH TWICE DAILY WITH A MEAL 180 tablet 0  . Cholecalciferol (VITAMIN D3) 2000 UNITS TABS Take 2,000 Units by mouth daily.    . folic acid (FOLVITE) 606 MCG tablet Take 400 mcg by mouth daily.    . furosemide (LASIX) 40 MG tablet Take 40 mg by mouth daily as needed.    . Glucosamine HCl 1500 MG TABS Take 1,500 mg by mouth every evening.     Marland Kitchen losartan (COZAAR) 25 MG tablet Take 1 tablet (25 mg total) by mouth daily. 90 tablet 3  . Multiple Vitamin (MULTIVITAMIN WITH MINERALS) TABS Take 1 tablet by mouth daily.     . mupirocin ointment (BACTROBAN) 2 % Apply topically 2 (two) times daily as needed.    . potassium chloride SA (KLOR-CON) 20 MEQ tablet TAKE 1 TABLET BY MOUTH DAILY, GENERIC EQUIVALENT FOR KLOR-CON. 90 tablet 2  . simvastatin (ZOCOR) 40 MG tablet Take 0.5 tablets (20 mg total) by mouth at bedtime. 45 tablet 3  . spironolactone (ALDACTONE) 25 MG tablet Take 12.5 mg by mouth daily as needed.    . tamsulosin (FLOMAX) 0.4 MG CAPS capsule Take 0.4 mg by mouth at bedtime.    Marland Kitchen tiZANidine (ZANAFLEX) 2 MG tablet Take 1 tablet (2 mg total) by mouth daily  as needed for muscle spasms (do not drive for 8 hours after use). 90 tablet 0  . warfarin (COUMADIN) 5 MG tablet Take 1/2 tablet daily except take 1 tablet on Sunday and Tuesday or Take as directed by anticoagulation clinic  90 day 90 tablet 3   No current facility-administered medications for this visit.     Objective:  BP 110/76   Pulse (!) 44   Temp 98.2 F (36.8 C) (Temporal)   Ht 5\' 11"  (1.803 m)   Wt 241 lb 9.6 oz (109.6 kg)   SpO2 94%   BMI 33.70 kg/m  Gen: NAD, resting comfortably CV: Irregularly irregular and bradycardic Skin: 3 x 3 cm abrasion on left knee appears to be healing well.  Also with abrasion on  left palm-patient has a central area which seems to crack open when he spreads his  thumb wide     Assessment and Plan   #Fall/ED follow-up S: Patient had a fall last Thursday may 12th. He was going to go walking around the complex where he lives- saw a dog in the dog run greyhound that he wanted to see- stepped in a hole that he missed with his eyes and fell forward into some concrete which had top layer worn off and was rather jagged. He did not feel lightheaded or dizzy prior to fall. BP has been controlled.   Had Tdap September 2021- he will get [place] the report from pharmacy.   Went to urgent care office. He had scraped left knee and left palm. Had a fair amount of bleeding they had to stop. Had pants on but still broke the skin on the knee. Having some knee pain after the fall but not severe- actually better with walking- worse if lifts legs sideways in bed. Some hand pain - when opens hand fully splits the central wound which is 1.5 cm long and several mm wide- using mupirocin that he was given. Slight abrasion on right hand. Did not hit his head. Has had some rib pain after the fall on left with motion  but that has improved substantially.  A/P: Patient with recent fall- knee and hand abrasion- both appear to be healing well.  We discussed continued conservative care- okay to continue mupirocin.  Discussed signs for infection and indications to return to care.  Thankfully he already had Tdap in September 2021-he is going to get [place] the date when he stops by  pharmacy today  #HM - also recommended covid 19 booster #4 at his age   Recommended follow up: Keep July visit Future Appointments  Date Time Provider [place]  06/27/2020  8:00 AM [facility] DEVICE REMOTES [facility]  07/11/2020 10:30 AM [facility] COUMADIN CLINIC [facility]  09/03/2020  1:00 PM [facility] HEALTH COACH [facility]  09/06/2020 10:40 AM Marin Olp, MD [facility]  09/26/2020  8:00 AM [facility]  DEVICE REMOTES [facility]  12/26/2020  8:00 AM [facility] DEVICE REMOTES [facility]  03/27/2021  8:00 AM [facility] DEVICE REMOTES [facility]  06/26/2021  8:00 AM [facility] DEVICE REMOTES [facility]    Lab/Order associations:   ICD-10-CM   1. Fall, initial encounter  W19.XXXA   2. Abrasion of left knee, initial encounter  S80.212A   3. Abrasion of left hand, initial encounter  S60.512A     Return precautions advised.  Garret Reddish, MD

## 2020-06-25 NOTE — Patient Instructions (Addendum)
  Health Maintenance Due  Topic Date Due  . TETANUS/TDAP 10/2019 patient will call back with dates.  09/18/2019  . COVID-19 Vaccine (4 - Booster for [employer] series) plans to do this at pharmacy 04/19/2020   I am glad you did not have worse injuries that you do have.  I want you to watch out for expanding redness, swelling, worsening pain particularly on the left hand and if that occurs to let us know immediately-you may need oral antibiotics.  Recommended follow up: keep July visit

## 2020-06-26 ENCOUNTER — Other Ambulatory Visit: Payer: Self-pay

## 2020-06-26 ENCOUNTER — Encounter: Payer: Self-pay | Admitting: Family Medicine

## 2020-06-26 ENCOUNTER — Ambulatory Visit (INDEPENDENT_AMBULATORY_CARE_PROVIDER_SITE_OTHER): Payer: Medicare Other | Admitting: Family Medicine

## 2020-06-26 VITALS — BP 110/76 | HR 44 | Temp 98.2°F | Ht 71.0 in | Wt 241.6 lb

## 2020-06-26 DIAGNOSIS — S80212A Abrasion, left knee, initial encounter: Secondary | ICD-10-CM | POA: Diagnosis not present

## 2020-06-26 DIAGNOSIS — W19XXXA Unspecified fall, initial encounter: Secondary | ICD-10-CM

## 2020-06-26 DIAGNOSIS — S60512A Abrasion of left hand, initial encounter: Secondary | ICD-10-CM

## 2020-06-27 ENCOUNTER — Ambulatory Visit (INDEPENDENT_AMBULATORY_CARE_PROVIDER_SITE_OTHER): Payer: Medicare Other

## 2020-06-27 ENCOUNTER — Other Ambulatory Visit: Payer: Self-pay | Admitting: Physician Assistant

## 2020-06-27 DIAGNOSIS — I255 Ischemic cardiomyopathy: Secondary | ICD-10-CM | POA: Diagnosis not present

## 2020-06-28 ENCOUNTER — Telehealth: Payer: Self-pay

## 2020-06-28 LAB — CUP PACEART REMOTE DEVICE CHECK
Battery Remaining Longevity: 62 mo
Battery Voltage: 2.98 V
Brady Statistic RV Percent Paced: 0.01 %
Date Time Interrogation Session: 20220518033324
HighPow Impedance: 70 Ohm
Implantable Lead Implant Date: 20150422
Implantable Lead Location: 753860
Implantable Lead Model: 6935
Implantable Pulse Generator Implant Date: 20150422
Lead Channel Impedance Value: 418 Ohm
Lead Channel Impedance Value: 456 Ohm
Lead Channel Pacing Threshold Amplitude: 0.5 V
Lead Channel Pacing Threshold Pulse Width: 0.4 ms
Lead Channel Sensing Intrinsic Amplitude: 13.75 mV
Lead Channel Sensing Intrinsic Amplitude: 13.75 mV
Lead Channel Setting Pacing Amplitude: 2 V
Lead Channel Setting Pacing Pulse Width: 0.4 ms
Lead Channel Setting Sensing Sensitivity: 0.3 mV

## 2020-06-28 NOTE — Telephone Encounter (Signed)
Successful follow up telephone encounter to [facility] with orders per Dr. Haroldine Laws to take furosemide 40mg  tab x 1 today and tomorrow and to send follow up remote transmission on Monday. Patient able to participate in teach back. Will continue to follow.

## 2020-06-28 NOTE — Telephone Encounter (Signed)
Carelink alert received for elevated OptiVol level and low impedence although trending towards baseline. Successful telephone encounter to patient. Verbalizes increased abdominal distention and shortness of breath with exertion. Denies swelling of hands and lower extremities, chest pain/pressure, dizziness. + for recent fall 06/21/20 (see PCP encounter 06/26/20). Patient has not taken PRN Lasix or Aldactone in "a while". He does not weigh himself daily or follow the CHF action plan. He does not follow a low sodium diet and admits to eating popcorn with salt, deli meat, canned soup/vegetables, and [employer].   Today he is educated on importance of daily weights and recording. He is advised of the HF action plan and green/yellow/red zones as well as foods that are high in sodium. He is encouraged to utilize PRN diuretics as prescribed. He is educated on s/s of fluid overload and cardiac emergencies. He will call HF clinic to see which medications he should utilize for PRN fluid accumulation as he is unsure if he should take Lasix/Aldactone or both. Will forward to Dr. Lovena Le and [place] as Juluis Rainier. Patient provided device clinic contact information for additional questions or concerns.

## 2020-07-05 NOTE — Telephone Encounter (Signed)
Successful telephone encounter to patient to follow up on recent fluid retention and elevated OptiVol level. Patient states took lasix PRN as prescribed and is now weighing daily. States weight has decreased from 239 to 231. Denies symptoms of volume depletion including dizziness upon standing. Shortness of breath and abdominal distension have improved. Most recent remote transmission post lasix PRN reviewed and OptiVol now below baseline. Reinforced low NA diet and CHF action plan including green/yellow/red zones and when to seek emergency care. Patient appreciative of education and follow up.

## 2020-07-11 ENCOUNTER — Ambulatory Visit: Payer: Medicare Other

## 2020-07-19 NOTE — Progress Notes (Signed)
Remote ICD transmission.   

## 2020-07-25 ENCOUNTER — Other Ambulatory Visit: Payer: Self-pay

## 2020-07-25 ENCOUNTER — Ambulatory Visit (INDEPENDENT_AMBULATORY_CARE_PROVIDER_SITE_OTHER): Payer: Medicare Other | Admitting: General Practice

## 2020-07-25 DIAGNOSIS — Z7901 Long term (current) use of anticoagulants: Secondary | ICD-10-CM

## 2020-07-25 LAB — POCT INR: INR: 2.1 (ref 2.0–3.0)

## 2020-07-25 NOTE — Progress Notes (Signed)
I have reviewed the patient's encounter and agree with the documentation.  Algis Greenhouse. Jerline Pain, MD 07/25/2020 11:16 AM

## 2020-07-25 NOTE — Patient Instructions (Addendum)
Pre visit review using our clinic review tool, if applicable. No additional management support is needed unless otherwise documented below in the visit note.  Continue to  take 1/2 tablet daily except 1 tablet on Sundays and Thursdays.  Re-check in 6 weeks.

## 2020-08-08 ENCOUNTER — Other Ambulatory Visit (HOSPITAL_COMMUNITY): Payer: Self-pay | Admitting: *Deleted

## 2020-08-08 DIAGNOSIS — I5022 Chronic systolic (congestive) heart failure: Secondary | ICD-10-CM

## 2020-08-08 MED ORDER — LOSARTAN POTASSIUM 25 MG PO TABS: 25.0000 mg | ORAL_TABLET | Freq: Every day | ORAL | 2 refills | Status: DC

## 2020-08-10 DIAGNOSIS — H5203 Hypermetropia, bilateral: Secondary | ICD-10-CM | POA: Diagnosis not present

## 2020-08-10 LAB — HM DIABETES EYE EXAM

## 2020-09-03 ENCOUNTER — Ambulatory Visit (INDEPENDENT_AMBULATORY_CARE_PROVIDER_SITE_OTHER): Payer: Medicare Other

## 2020-09-03 DIAGNOSIS — Z0000 Encounter for general adult medical examination without abnormal findings: Secondary | ICD-10-CM

## 2020-09-03 NOTE — Progress Notes (Signed)
Virtual Visit via Telephone Note  I connected with  Joseph Malone on 09/03/20 at  1:00 PM EDT by telephone and verified that I am speaking with the correct person using two identifiers.  Medicare Annual Wellness visit completed telephonically due to Covid-19 pandemic.   Persons participating in this call: This Health Coach and this patient.   Location: Patient: Home Provider: Office   I discussed the limitations, risks, security and privacy concerns of performing an evaluation and management service by telephone and the availability of in person appointments. The patient expressed understanding and agreed to proceed.  Unable to perform video visit due to video visit attempted and failed and/or patient does not have video capability.   Some vital signs may be absent or patient reported.   Willette Brace, LPN   Subjective:   Joseph Malone is a 87 y.o. male who presents for Medicare Annual/Subsequent preventive examination.  Review of Systems     Cardiac Risk Factors include: advanced age (>74mn, >>26women);diabetes mellitus;hypertension;dyslipidemia;male gender;obesity (BMI >30kg/m2)     Objective:    There were no vitals filed for this visit. There is no height or weight on file to calculate BMI.  Advanced Directives 09/03/2020 08/29/2019 10/21/2018 10/01/2018 06/12/2017 06/01/2013  Does Patient Have a Medical Advance Directive? No Yes No No Yes Patient does not have advance directive;Patient would like information  Type of Advance Directive - - - - [place] Living will  Does patient want to make changes to medical advance directive? - Yes (MAU/Ambulatory/Procedural Areas - Information given) - - No - Patient declined -  Copy of [place] Chart? - - - - No - copy requested -  Would patient like information on creating a medical advance directive? Yes (MAU/Ambulatory/Procedural Areas - Information given) - No - Patient declined  - - Advance directive packet given    Current Medications (verified) Outpatient Encounter Medications as of 09/03/2020  Medication Sig   acetaminophen (TYLENOL) 500 MG tablet Take 500 mg by mouth at bedtime.    aspirin 81 MG tablet Take 1 tablet (81 mg total) by mouth every evening.   carvedilol (COREG) 3.125 MG tablet TAKE 1 TABLET(3.125 MG) BY MOUTH TWICE DAILY WITH A MEAL   Cholecalciferol (VITAMIN D3) 2000 UNITS TABS Take 2,000 Units by mouth daily.   folic acid (FOLVITE) 4A999333MCG tablet Take 400 mcg by mouth daily.   furosemide (LASIX) 40 MG tablet Take 40 mg by mouth daily as needed.   Glucosamine HCl 1500 MG TABS Take 1,500 mg by mouth every evening.    losartan (COZAAR) 25 MG tablet Take 1 tablet (25 mg total) by mouth daily.   Melatonin 5 MG CHEW Chew by mouth.   mupirocin ointment (BACTROBAN) 2 % Apply topically 2 (two) times daily as needed.   potassium chloride SA (KLOR-CON) 20 MEQ tablet TAKE 1 TABLET BY MOUTH DAILY, GENERIC EQUIVALENT FOR KLOR-CON.   simvastatin (ZOCOR) 40 MG tablet Take 0.5 tablets (20 mg total) by mouth at bedtime.   spironolactone (ALDACTONE) 25 MG tablet Take 12.5 mg by mouth daily as needed.   tamsulosin (FLOMAX) 0.4 MG CAPS capsule Take 0.4 mg by mouth at bedtime.   warfarin (COUMADIN) 5 MG tablet Take 1/2 tablet daily except take 1 tablet on Sunday and Tuesday or Take as directed by anticoagulation clinic  90 day (Patient taking differently: Take 1/2 tablet daily except take 1 tablet on Sunday and Thursday or Take as  directed by anticoagulation clinic  90 day)   Multiple Vitamin (MULTIVITAMIN WITH MINERALS) TABS Take 1 tablet by mouth daily.    tiZANidine (ZANAFLEX) 2 MG tablet Take 1 tablet (2 mg total) by mouth daily as needed for muscle spasms (do not drive for 8 hours after use). (Patient not taking: Reported on 09/03/2020)   [DISCONTINUED] carvedilol (COREG) 3.125 MG tablet TAKE 1 TABLET(3.125 MG) BY MOUTH TWICE DAILY WITH A MEAL   [DISCONTINUED]  cephALEXin (KEFLEX) 500 MG capsule Take 1 capsule (500 mg total) by mouth 3 (three) times daily. (Patient not taking: Reported on 06/07/2020)   [DISCONTINUED] ciprofloxacin (CIPRO) 250 MG tablet Take 1 tablet (250 mg total) by mouth 2 (two) times daily. (Patient not taking: Reported on 06/07/2020)   [DISCONTINUED] losartan (COZAAR) 25 MG tablet Take 1 tablet (25 mg total) by mouth daily.   [DISCONTINUED] potassium chloride SA (KLOR-CON) 20 MEQ tablet TAKE 1 TABLET BY MOUTH DAILY, GENERIC EQUIVALENT FOR KLOR-CON.   [DISCONTINUED] tiZANidine (ZANAFLEX) 2 MG tablet Take 1 tablet (2 mg total) by mouth daily as needed for muscle spasms.   No facility-administered encounter medications on file as of 09/03/2020.    Allergies (verified) Penicillins   History: Past Medical History:  Diagnosis Date   Anxiety    BPH (benign prostatic hypertrophy)    CAD (coronary artery disease)    s/p CABG   CHF (congestive heart failure) (HCC)    Diverticulosis    Diverticulosis of colon without hemorrhage 10/22/2007   Qualifier: Diagnosis of  By: Linna Darner MD, [name]     DOE (dyspnea on exertion)    Excess weight    Fasting hyperglycemia    Generalized weakness    GERD (gastroesophageal reflux disease)    [place]   Heart failure    CHF due to ischemic CM   History of kidney stones    Hyperlipidemia    Hypertension    Ischemic cardiomyopathy    Microscopic hematuria    [facility]   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   Past Surgical History:  Procedure Laterality Date   APPENDECTOMY     BIOPSY  10/21/2018   Procedure: BIOPSY;  Surgeon: Yetta Flock, MD;  Location: [facility];  Service: Gastroenterology;;   [place]  2005   [employer]; s/p ICD gen change and RV lead revision April 2015   CATARACT EXTRACTION  2008   bilateral with lens implant   COLONOSCOPY  2004   Tics; [facility]   CORONARY ARTERY BYPASS  GRAFT  2000   1 vessel   CYSTOSCOPY  06/2012   Dr [name]   ESOPHAGOGASTRODUODENOSCOPY (EGD) WITH PROPOFOL N/A 10/21/2018   Procedure: ESOPHAGOGASTRODUODENOSCOPY (EGD) WITH PROPOFOL;  Surgeon: Yetta Flock, MD;  Location: [facility];  Service: Gastroenterology;  Laterality: N/A;   HOT HEMOSTASIS N/A 10/21/2018   Procedure: HOT HEMOSTASIS (ARGON PLASMA COAGULATION/BICAP);  Surgeon: Yetta Flock, MD;  Location: [facility];  Service: Gastroenterology;  Laterality: N/A;   IMPLANTABLE CARDIOVERTER DEFIBRILLATOR (ICD) GENERATOR CHANGE N/A 06/01/2013   Procedure: ICD GENERATOR CHANGE;  Surgeon: [name], MD;  Location: [facility] CATH LAB;  Service: Cardiovascular;  Laterality: N/A;   LEAD REVISION N/A 06/01/2013   Procedure: LEAD REVISION;  Surgeon: [name], MD;  Location: [facility] CATH LAB;  Service: Cardiovascular;  Laterality: N/A;   PILONIDAL CYST EXCISION     RIGHT/LEFT HEART CATH AND CORONARY/GRAFT ANGIOGRAPHY N/A 06/12/2017  Procedure: RIGHT/LEFT HEART CATH AND CORONARY/GRAFT ANGIOGRAPHY;  Surgeon: [name], MD;  Location: [facility] LAB;  Service: Cardiovascular;  Laterality: N/A;   TRANSTHORACIC ECHOCARDIOGRAM  08/29/2005   Family History  Problem Relation Age of Onset   Hypertension Father    Heart attack Father 92   Diabetes Brother        X 2   Nephrolithiasis Brother    Heart attack Brother 88   Heart attack Paternal Uncle 89   Sudden death Paternal Grandfather 57       ? heat stroke   Lung cancer Sister        "Asian lung cancer"   Stroke Neg Hx    Colon cancer Neg Hx    Esophageal cancer Neg Hx    Rectal cancer Neg Hx    Stomach cancer Neg Hx    Social History   Socioeconomic History   Marital status: Married    Spouse name: Not on file   Number of children: Not on file   Years of education: Not on file   Highest education level: Not on file  Occupational History   Occupation: Retired  Tobacco Use   Smoking status: Passive Smoke  Exposure - Never Smoker   Smokeless tobacco: Never  Vaping Use   Vaping Use: Never used  Substance and Sexual Activity   Alcohol use: No   Drug use: No   Sexual activity: Never  Other Topics Concern   Not on file  Social History Narrative   Married 1957. 4 children 2 boys 2 girls. 15 grandkids.  4 greatgrandkids.       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       Hobbies: genealogy, travel      No HCPOA-advised to do this.    Social Determinants of Health   Financial Resource Strain: Low Risk    Difficulty of Paying Living Expenses: Not hard at all  Food Insecurity: No Food Insecurity   Worried About Charity fundraiser in the Last Year: Never true   [place] in the Last Year: Never true  Transportation Needs: No Transportation Needs   Lack of Transportation (Medical): No   Lack of Transportation (Non-Medical): No  Physical Activity: Sufficiently Active   Days of Exercise per Week: 3 days   Minutes of Exercise per Session: 50 min  Stress: No Stress Concern Present   Feeling of Stress : Only a little  Social Connections: Engineer, building services of Communication with Friends and Family: More than three times a week   Frequency of Social Gatherings with Friends and Family: More than three times a week   Attends Religious Services: More than 4 times per year   Active Member of [employer] or Organizations: Yes   Attends Archivist Meetings: 1 to 4 times per year   Marital Status: Married    Tobacco Counseling Counseling given: Not Answered   Clinical Intake:  Pre-visit preparation completed: Yes  Pain : No/denies pain     BMI - recorded: 33.71 Nutritional Status: BMI > 30  Obese Nutritional Risks: None Diabetes: Yes CBG done?: No Did pt. bring in CBG monitor from home?: No  How often do you need to have someone help you when you read instructions, pamphlets, or other written materials from your  doctor or pharmacy?: 1 - Never  Diabetic?Nutrition Risk Assessment:  Has the patient had any N/V/D within  the last 2 months?  No  Does the patient have any non-healing wounds?  No  Has the patient had any unintentional weight loss or weight gain?  No   Diabetes:  Is the patient diabetic?  Yes  If diabetic, was a CBG obtained today?  No  Did the patient bring in their glucometer from home?  No  How often do you monitor your CBG's? Most days .   Financial Strains and Diabetes Management:  Are you having any financial strains with the device, your supplies or your medication? No .  Does the patient want to be seen by Chronic Care Management for management of their diabetes?  No  Would the patient like to be referred to a Nutritionist or for Diabetic Management?  No   Diabetic Exams:   Diabetic Eye Exam: Overdue for diabetic eye exam. Pt has been advised about the importance in completing this exam. Patient advised to call and schedule an eye exam. Diabetic Foot Exam: Overdue, Pt has been advised about the importance in completing this exam. Pt is scheduled for diabetic foot exam on pt has appt 09/06/20.   Interpreter Needed?: No  Information entered by :: Charlott Rakes, LPN   Activities of Daily Living In your present state of health, do you have any difficulty performing the following activities: 09/03/2020  Hearing? Y  Comment hearing aids at times  Vision? N  Difficulty concentrating or making decisions? Y  Comment short term at times  Walking or climbing stairs? Y  Dressing or bathing? N  Doing errands, shopping? N  Preparing Food and eating ? N  Using the Toilet? N  In the past six months, have you accidently leaked urine? N  Do you have problems with loss of bowel control? N  Managing your Medications? N  Managing your Finances? N  Housekeeping or managing your Housekeeping? N  Some recent data might be hidden    Patient Care Team: Marin Olp, MD as PCP -  General (Family Medicine) [name], MD as PCP - Electrophysiology (Cardiology) Bensimhon, Shaune Pascal, MD as PCP - Advanced Heart Failure (Cardiology) Festus Aloe, MD as Consulting Physician (Urology) Allyn Kenner, MD (Dermatology)  Indicate any recent Medical Services you may have received from other than [facility] providers in the past year (date may be approximate).     Assessment:   This is a routine wellness examination for Joseph Malone.  Hearing/Vision screen Hearing Screening - Comments:: Pt has hearing aids  Vision Screening - Comments:: [facility]   Dietary issues and exercise activities discussed: Current Exercise Habits: Home exercise routine, Type of exercise: Other - see comments, Time (Minutes): 35, Frequency (Times/Week): 3, Weekly Exercise (Minutes/Week): 105   Goals Addressed             This Visit's Progress    Patient Stated       Stay healthy and exercise       Depression Screen PHQ 2/9 Scores 09/03/2020 06/26/2020 02/28/2020 08/29/2019 08/26/2019 08/23/2018 11/17/2017  PHQ - 2 Score 0 0 0 2 3 0 1  PHQ- 9 Score - 0 - '4 7 3 6    '$ Fall Risk Fall Risk  09/03/2020 06/26/2020 08/29/2019 08/26/2019 01/25/2019  Falls in the past year? 1 1 0 0 0  Number falls in past yr: 1 0 0 0 0  Injury with Fall? 1 1 0 0 0  Comment skinned knees - - - -  Risk for fall due to : Impaired balance/gait;Impaired mobility;Impaired vision - Impaired mobility;Impaired balance/gait - -  Follow up Falls prevention discussed - Falls prevention discussed - -    FALL RISK PREVENTION PERTAINING TO THE HOME:  Any stairs in or around the home? No  If so, are there any without handrails? No  Home free of loose throw rugs in walkways, pet beds, electrical cords, etc? Yes  Adequate lighting in your home to reduce risk of falls? Yes   ASSISTIVE DEVICES UTILIZED TO PREVENT FALLS:  Life alert? No  Use of a cane, walker or w/c? Yes  Grab bars in  the bathroom? No  Shower chair or bench in shower? No  Elevated toilet seat or a handicapped toilet? No   TIMED UP AND GO:  Was the test performed? No     Cognitive Function:     6CIT Screen 09/03/2020 08/29/2019  What Year? 0 points 0 points  What month? 0 points 0 points  What time? 0 points 0 points  Count back from 20 0 points 0 points  Months in reverse 0 points 0 points  Repeat phrase 2 points 4 points  Total Score 2 4    Immunizations Immunization History  Administered Date(s) Administered   Fluad Quad(high Dose 65+) 01/20/2020   Influenza, High Dose Seasonal PF 10/12/2018   Influenza,inj,Quad PF,6+ Mos 11/11/2012   Influenza-Unspecified 10/25/2013, 10/27/2014, 10/24/2015, 10/19/2017   PFIZER(Purple Top)SARS-COV-2 Vaccination 03/06/2019, 03/24/2019, 01/20/2020, 06/27/2020   Pneumococcal Conjugate-13 08/21/2014   Pneumococcal Polysaccharide-23 10/02/2009   Td 09/17/2009, 10/31/2019   Zoster Recombinat (Shingrix) 06/27/2020, 08/27/2020   Zoster, Live 12/29/2012    TDAP status: Up to date  Flu Vaccine status: Up to date  Pneumococcal vaccine status: Up to date  Covid-19 vaccine status: Completed vaccines  Qualifies for Shingles Vaccine? Yes   Zostavax completed Yes   Shingrix Completed?: Yes  Screening Tests Health Maintenance  Topic Date Due   OPHTHALMOLOGY EXAM  08/08/2020   FOOT EXAM  08/25/2020   HEMOGLOBIN A1C  08/27/2020   INFLUENZA VACCINE  09/10/2020   COVID-19 Vaccine (5 - Booster for Pfizer series) 10/28/2020   TETANUS/TDAP  10/30/2029   PNA vac Low Risk Adult  Completed   Zoster Vaccines- Shingrix  Completed   HPV VACCINES  Aged Out    Health Maintenance  Health Maintenance Due  Topic Date Due   OPHTHALMOLOGY EXAM  08/08/2020   FOOT EXAM  08/25/2020   HEMOGLOBIN A1C  08/27/2020    Colorectal cancer screening: No longer required.    Additional Screening:   Vision Screening: Recommended annual ophthalmology exams for early  detection of glaucoma and other disorders of the eye. Is the patient up to date with their annual eye exam?  Yes  Who is the provider or what is the name of the office in which the patient attends annual eye exams? Dr Wynetta Emery  If pt is not established with a provider, would they like to be referred to a provider to establish care? No .   Dental Screening: Recommended annual dental exams for proper oral hygiene  Community Resource Referral / Chronic Care Management: CRR required this visit?  No   CCM required this visit?  No      Plan:     I have personally reviewed and noted the following in the patient's chart:   Medical and social history Use of alcohol, tobacco or illicit drugs  Current medications and supplements including opioid prescriptions. Patient is not currently  taking opioid prescriptions. Functional ability and status Nutritional status Physical activity Advanced directives List of other physicians Hospitalizations, surgeries, and ER visits in previous 12 months Vitals Screenings to include cognitive, depression, and falls Referrals and appointments  In addition, I have reviewed and discussed with patient certain preventive protocols, quality metrics, and best practice recommendations. A written personalized care plan for preventive services as well as general preventive health recommendations were provided to patient.     Willette Brace, LPN   [id number]   Nurse Notes: Pt stated that he no longer takes tylenol PM and had started taking melatonin, he wanted to discuss with you at next visit he stated the melatonin helped him sleep better. I stated it was otc however I would make you aware of the change to confirm he could continue, please advise. He has an appt on 09/06/20

## 2020-09-03 NOTE — Progress Notes (Signed)
I have reviewed and agree with note, evaluation, plan.   I actually prefer melatonin if at lower dose at his age- we can discuss further at his upcoming visit  Garret Reddish, MD

## 2020-09-03 NOTE — Patient Instructions (Signed)
[name] , Thank you for taking time to come for your Medicare Wellness Visit. I appreciate your ongoing commitment to your health goals. Please review the following plan we discussed and let me know if I can assist you in the future.   Screening recommendations/referrals: Colonoscopy: No longer required  Recommended yearly ophthalmology/optometry visit for glaucoma screening and checkup Recommended yearly dental visit for hygiene and checkup  Vaccinations: Influenza vaccine: Due after 09/10/20 Pneumococcal vaccine: Completed  Tdap vaccine: done 10/31/19  Shingles vaccine: Done 5/18 & 08/27/20   Covid-19: Completed 1/24, 2/11, 01/20/20 & 06/27/20  Advanced directives: Advance directive discussed with you today. Even though you declined this today please call our office should you change your mind and we can give you the proper paperwork for you to fill out.  Conditions/risks identified: Stay healthy and continue exercise   Next appointment: Follow up in one year for your annual wellness visit.   Preventive Care 61 Years and Older, Male Preventive care refers to lifestyle choices and visits with your health care provider that can promote health and wellness. What does preventive care include? A yearly physical exam. This is also called an annual well check. Dental exams once or twice a year. Routine eye exams. Ask your health care provider how often you should have your eyes checked. Personal lifestyle choices, including: Daily care of your teeth and gums. Regular physical activity. Eating a healthy diet. Avoiding tobacco and drug use. Limiting alcohol use. Practicing safe sex. Taking low doses of aspirin every day. Taking vitamin and mineral supplements as recommended by your health care provider. What happens during an annual well check? The services and screenings done by your health care provider during your annual well check will depend on your age, overall health, lifestyle  risk factors, and family history of disease. Counseling  Your health care provider may ask you questions about your: Alcohol use. Tobacco use. Drug use. Emotional well-being. Home and relationship well-being. Sexual activity. Eating habits. History of falls. Memory and ability to understand (cognition). Work and work Statistician. Screening  You may have the following tests or measurements: Height, weight, and BMI. Blood pressure. Lipid and cholesterol levels. These may be checked every 5 years, or more frequently if you are over 64 years old. Skin check. Lung cancer screening. You may have this screening every year starting at age 69 if you have a 30-pack-year history of smoking and currently smoke or have quit within the past 15 years. Fecal occult blood test (FOBT) of the stool. You may have this test every year starting at age 58. Flexible sigmoidoscopy or colonoscopy. You may have a sigmoidoscopy every 5 years or a colonoscopy every 10 years starting at age 55. Prostate cancer screening. Recommendations will vary depending on your family history and other risks. Hepatitis C blood test. Hepatitis B blood test. Sexually transmitted disease (STD) testing. Diabetes screening. This is done by checking your blood sugar (glucose) after you have not eaten for a while (fasting). You may have this done every 1-3 years. Abdominal aortic aneurysm (AAA) screening. You may need this if you are a current or former smoker. Osteoporosis. You may be screened starting at age 47 if you are at high risk. Talk with your health care provider about your test results, treatment options, and if necessary, the need for more tests. Vaccines  Your health care provider may recommend certain vaccines, such as: Influenza vaccine. This is recommended every year. Tetanus, diphtheria, and acellular pertussis (Tdap, Td)  vaccine. You may need a Td booster every 10 years. Zoster vaccine. You may need this after age  50. Pneumococcal 13-valent conjugate (PCV13) vaccine. One dose is recommended after age 73. Pneumococcal polysaccharide (PPSV23) vaccine. One dose is recommended after age 58. Talk to your health care provider about which screenings and vaccines you need and how often you need them. This information is not intended to replace advice given to you by your health care provider. Make sure you discuss any questions you have with your health care provider. Document Released: 02/23/2015 Document Revised: 10/17/2015 Document Reviewed: 11/28/2014 Elsevier Interactive Patient Education  2017 [place] Prevention in the Home Falls can cause injuries. They can happen to people of all ages. There are many things you can do to make your home safe and to help prevent falls. What can I do on the outside of my home? Regularly fix the edges of walkways and driveways and fix any cracks. Remove anything that might make you trip as you walk through a door, such as a raised step or threshold. Trim any bushes or trees on the path to your home. Use bright outdoor lighting. Clear any walking paths of anything that might make someone trip, such as rocks or tools. Regularly check to see if handrails are loose or broken. Make sure that both sides of any steps have handrails. Any raised decks and porches should have guardrails on the edges. Have any leaves, snow, or ice cleared regularly. Use sand or salt on walking paths during winter. Clean up any spills in your garage right away. This includes oil or grease spills. What can I do in the bathroom? Use night lights. Install grab bars by the toilet and in the tub and shower. Do not use towel bars as grab bars. Use non-skid mats or decals in the tub or shower. If you need to sit down in the shower, use a plastic, non-slip stool. Keep the floor dry. Clean up any water that spills on the floor as soon as it happens. Remove soap buildup in the tub or shower  regularly. Attach bath mats securely with double-sided non-slip rug tape. Do not have throw rugs and other things on the floor that can make you trip. What can I do in the bedroom? Use night lights. Make sure that you have a light by your bed that is easy to reach. Do not use any sheets or blankets that are too big for your bed. They should not hang down onto the floor. Have a firm chair that has side arms. You can use this for support while you get dressed. Do not have throw rugs and other things on the floor that can make you trip. What can I do in the kitchen? Clean up any spills right away. Avoid walking on wet floors. Keep items that you use a lot in easy-to-reach places. If you need to reach something above you, use a strong step stool that has a grab bar. Keep electrical cords out of the way. Do not use floor polish or wax that makes floors slippery. If you must use wax, use non-skid floor wax. Do not have throw rugs and other things on the floor that can make you trip. What can I do with my stairs? Do not leave any items on the stairs. Make sure that there are handrails on both sides of the stairs and use them. Fix handrails that are broken or loose. Make sure that handrails are as long  as the stairways. Check any carpeting to make sure that it is firmly attached to the stairs. Fix any carpet that is loose or worn. Avoid having throw rugs at the top or bottom of the stairs. If you do have throw rugs, attach them to the floor with carpet tape. Make sure that you have a light switch at the top of the stairs and the bottom of the stairs. If you do not have them, ask someone to add them for you. What else can I do to help prevent falls? Wear shoes that: Do not have high heels. Have rubber bottoms. Are comfortable and fit you well. Are closed at the toe. Do not wear sandals. If you use a stepladder: Make sure that it is fully opened. Do not climb a closed stepladder. Make sure that  both sides of the stepladder are locked into place. Ask someone to hold it for you, if possible. Clearly mark and make sure that you can see: Any grab bars or handrails. First and last steps. Where the edge of each step is. Use tools that help you move around (mobility aids) if they are needed. These include: Canes. Walkers. Scooters. Crutches. Turn on the lights when you go into a dark area. Replace any light bulbs as soon as they burn out. Set up your furniture so you have a clear path. Avoid moving your furniture around. If any of your floors are uneven, fix them. If there are any pets around you, be aware of where they are. Review your medicines with your doctor. Some medicines can make you feel dizzy. This can increase your chance of falling. Ask your doctor what other things that you can do to help prevent falls. This information is not intended to replace advice given to you by your health care provider. Make sure you discuss any questions you have with your health care provider. Document Released: 11/23/2008 Document Revised: 07/05/2015 Document Reviewed: 03/03/2014 Elsevier Interactive Patient Education  2017 [employer].

## 2020-09-05 ENCOUNTER — Other Ambulatory Visit: Payer: Self-pay

## 2020-09-05 ENCOUNTER — Ambulatory Visit (INDEPENDENT_AMBULATORY_CARE_PROVIDER_SITE_OTHER): Payer: Medicare Other | Admitting: General Practice

## 2020-09-05 DIAGNOSIS — Z7901 Long term (current) use of anticoagulants: Secondary | ICD-10-CM

## 2020-09-05 LAB — POCT INR: INR: 2 (ref 2.0–3.0)

## 2020-09-05 NOTE — Progress Notes (Signed)
I have reviewed and agree with note, evaluation, plan.   Daxson Reffett, MD  

## 2020-09-05 NOTE — Patient Instructions (Addendum)
Pre visit review using our clinic review tool, if applicable. No additional management support is needed unless otherwise documented below in the visit note.  Take 1 tablet today (7/27) and then continue to  take 1/2 tablet daily except 1 tablet on Sundays and Thursdays.  Re-check in 6 weeks.

## 2020-09-06 ENCOUNTER — Ambulatory Visit (INDEPENDENT_AMBULATORY_CARE_PROVIDER_SITE_OTHER): Payer: Medicare Other | Admitting: Family Medicine

## 2020-09-06 ENCOUNTER — Encounter: Payer: Self-pay | Admitting: Family Medicine

## 2020-09-06 VITALS — BP 117/71 | HR 73 | Temp 98.0°F | Ht 71.0 in | Wt 246.2 lb

## 2020-09-06 DIAGNOSIS — Z23 Encounter for immunization: Secondary | ICD-10-CM | POA: Diagnosis not present

## 2020-09-06 DIAGNOSIS — I152 Hypertension secondary to endocrine disorders: Secondary | ICD-10-CM

## 2020-09-06 DIAGNOSIS — E1151 Type 2 diabetes mellitus with diabetic peripheral angiopathy without gangrene: Secondary | ICD-10-CM | POA: Diagnosis not present

## 2020-09-06 DIAGNOSIS — Z0000 Encounter for general adult medical examination without abnormal findings: Secondary | ICD-10-CM | POA: Diagnosis not present

## 2020-09-06 DIAGNOSIS — E1159 Type 2 diabetes mellitus with other circulatory complications: Secondary | ICD-10-CM | POA: Diagnosis not present

## 2020-09-06 LAB — COMPREHENSIVE METABOLIC PANEL
ALT: 20 U/L (ref 0–53)
AST: 20 U/L (ref 0–37)
Albumin: 3.9 g/dL (ref 3.5–5.2)
Alkaline Phosphatase: 54 U/L (ref 39–117)
BUN: 21 mg/dL (ref 6–23)
CO2: 24 mEq/L (ref 19–32)
Calcium: 9.1 mg/dL (ref 8.4–10.5)
Chloride: 105 mEq/L (ref 96–112)
Creatinine, Ser: 1.18 mg/dL (ref 0.40–1.50)
GFR: 55.07 mL/min — ABNORMAL LOW (ref >60.00)
Glucose, Bld: 179 mg/dL — ABNORMAL HIGH (ref 70–99)
Potassium: 4.2 mEq/L (ref 3.5–5.1)
Sodium: 138 mEq/L (ref 135–145)
Total Bilirubin: 0.4 mg/dL (ref 0.2–1.2)
Total Protein: 6.9 g/dL (ref 6.0–8.3)

## 2020-09-06 LAB — LIPID PANEL
Cholesterol: 148 mg/dL (ref 0–200)
HDL: 42.8 mg/dL (ref >39.00)
LDL Cholesterol: 80 mg/dL (ref 0–99)
NonHDL: 105.09
Total CHOL/HDL Ratio: 3
Triglycerides: 123 mg/dL (ref 0.0–149.0)
VLDL: 24.6 mg/dL (ref 0.0–40.0)

## 2020-09-06 LAB — CBC WITH DIFFERENTIAL/PLATELET
Basophils Absolute: 0.1 10*3/uL (ref 0.0–0.1)
Basophils Relative: 1.4 % (ref 0.0–3.0)
Eosinophils Absolute: 0.2 10*3/uL (ref 0.0–0.7)
Eosinophils Relative: 3.6 % (ref 0.0–5.0)
HCT: 41.5 % (ref 39.0–52.0)
Hemoglobin: 13.9 g/dL (ref 13.0–17.0)
Lymphocytes Relative: 37.9 % (ref 12.0–46.0)
Lymphs Abs: 2.5 10*3/uL (ref 0.7–4.0)
MCHC: 33.5 g/dL (ref 30.0–36.0)
MCV: 92.3 fl (ref 78.0–100.0)
Monocytes Absolute: 0.5 10*3/uL (ref 0.1–1.0)
Monocytes Relative: 7.6 % (ref 3.0–12.0)
Neutro Abs: 3.3 10*3/uL (ref 1.4–7.7)
Neutrophils Relative %: 49.5 % (ref 43.0–77.0)
Platelets: 167 10*3/uL (ref 150.0–400.0)
RBC: 4.5 Mil/uL (ref 4.22–5.81)
RDW: 13.2 % (ref 11.5–15.5)
WBC: 6.7 10*3/uL (ref 4.0–10.5)

## 2020-09-06 LAB — HEMOGLOBIN A1C: Hgb A1c MFr Bld: 7.7 % — ABNORMAL HIGH (ref 4.6–6.5)

## 2020-09-06 MED ORDER — CARVEDILOL 6.25 MG PO TABS: 6.2500 mg | ORAL_TABLET | Freq: Two times a day (BID) | ORAL | 3 refills | Status: DC

## 2020-09-06 NOTE — Patient Instructions (Addendum)
Please stop by lab before you go If you have mychart- we will send your results within 3 business days of [place] receiving them.  If you do not have mychart- we will call you about results within 5 business days of [place] receiving them.  *please also note that you will see labs on mychart as soon as they post. I will later go in and write notes on them- will say "notes from Dr. Yong Channel"  Prevnar 20 today.  Try Eucerin cream/lotion or Vaseline for your skin to help with the dryness. -If not improving Try Lamisil over-the-counter cream or tough acting tinactin for the redness/possible fungal infection on your left foot.  Continue carvedilol 6.25 mg twice daily- I sent in 6.'25mg'$  dose for you but can finish 25 mg tablets taking only a 1/4th twice a day  Take one lasix and spironolactone each for a 3-day trial to see if this will help with swelling and weight gain.  You may repeat this again in a week if needed - please call us if home weight numbers or swelling is not improving.  Please continue your exercises- try to increase the weight on the machines you use ( by 5-10 lbs) and lower your rep range perhaps to 10-12 so you do not feel overexerted- 3 sets maximum  Recommended follow up: Return in about 6 months (around 03/09/2021) for follow-up or sooner if needed.

## 2020-09-06 NOTE — Addendum Note (Signed)
Addended by: Linton Ham on: 09/06/2020 11:36 AM   Modules accepted: Orders

## 2020-09-06 NOTE — Progress Notes (Addendum)
Phone: [phone]   Subjective:  Patient presents today for their annual physical. Chief complaint-noted.   See problem oriented charting- ROS- full  review of systems was completed and negative  except for: runny nose, sneezing, shortness of breath, leg swelling, increased urination and frequency, joint pain and back pain, decreased concentration  The following were reviewed and entered/updated in epic: Past Medical History:  Diagnosis Date   Anxiety    BPH (benign prostatic hypertrophy)    CAD (coronary artery disease)    s/p CABG   CHF (congestive heart failure) ([place])    Diverticulosis    Diverticulosis of colon without hemorrhage 10/22/2007   Qualifier: Diagnosis of  By: Linna Darner MD, [name]     DOE (dyspnea on exertion)    Excess weight    Fasting hyperglycemia    Generalized weakness    GERD (gastroesophageal reflux disease)    Heart attack ([place]) 2001   Heart failure    CHF due to ischemic CM   History of kidney stones    Hyperlipidemia    Hypertension    Ischemic cardiomyopathy    Microscopic hematuria    [facility]   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   Patient Active Problem List   Diagnosis Date Noted   Chronic systolic heart failure ([place]) 12/09/2010    Priority: High   Cardiomyopathy, ischemic 10/02/2009    Priority: High   Automatic implantable cardioverter-defibrillator in situ 01/16/2009    Priority: High   DM (diabetes mellitus) type II, controlled, with peripheral vascular disorder ([place]) 09/14/2008    Priority: High   CAD in native artery 09/25/2006    Priority: High   Atherosclerosis of aorta ([place]) 05/18/2020    Priority: Medium   Nephrolithiasis 03/24/2016    Priority: Medium   Hyperlipidemia associated with type 2 diabetes mellitus ([place]) 04/22/2007    Priority: Medium   Hypertension associated with diabetes ([place]) 09/25/2006    Priority: Medium   Long term (current) use of anticoagulants 07/08/2017    Priority: Low    Erectile dysfunction 01/19/2014    Priority: Low   Basal cell cancer 09/29/2012    Priority: Low   Solitary pulmonary nodule 07/12/2012    Priority: Low   BPH without obstruction/lower urinary tract symptoms 10/22/2007    Priority: Low   Osteoarthritis 09/25/2006    Priority: Low   Hematuria 09/25/2006    Priority: Low   PVC's (premature ventricular contractions) 01/24/2020   Melena    Past Surgical History:  Procedure Laterality Date   APPENDECTOMY     BIOPSY  10/21/2018   Procedure: BIOPSY;  Surgeon: Yetta Flock, MD;  Location: [facility];  Service: Gastroenterology;;   CARDIAC CATHETERIZATION  2000   CARDIAC DEFIBRILLATOR PLACEMENT  2005   [employer]; s/p ICD gen change and RV lead revision April 2015   CATARACT EXTRACTION  2008   bilateral with lens implant   COLONOSCOPY  2004   Tics; [facility]   CORONARY ARTERY BYPASS GRAFT  2000   1 vessel   CYSTOSCOPY  06/2012   Dr [name]   ESOPHAGOGASTRODUODENOSCOPY (EGD) WITH PROPOFOL N/A 10/21/2018   Procedure: ESOPHAGOGASTRODUODENOSCOPY (EGD) WITH PROPOFOL;  Surgeon: Yetta Flock, MD;  Location: [facility];  Service: Gastroenterology;  Laterality: N/A;   HOT HEMOSTASIS N/A 10/21/2018   Procedure: HOT HEMOSTASIS (ARGON PLASMA COAGULATION/BICAP);  Surgeon: Yetta Flock, MD;  Location: [facility];  Service: Gastroenterology;  Laterality: N/A;   IMPLANTABLE CARDIOVERTER DEFIBRILLATOR (ICD) GENERATOR CHANGE  N/A 06/01/2013   Procedure: ICD GENERATOR CHANGE;  Surgeon: [name], MD;  Location: [facility] C[facility] LAB;  Service: Cardiovascular;  Laterality: N/A;   LEAD REVISION N/A 06/01/2013   Procedure: LEAD REVISION;  Surgeon: [name], MD;  Location: [facility] CATH LAB;  Service: Cardiovascular;  Laterality: N/A;   PILONIDAL CYST EXCISION     RIGHT/LEFT HEART CATH AND CORONARY/GRAFT ANGIOGRAPHY N/A 06/12/2017   Procedure: RIGHT/LEFT HEART CATH AND CORONARY/GRAFT ANGIOGRAPHY;  Surgeon: [name], MD;   Location: [facility];  Service: Cardiovascular;  Laterality: N/A;   TRANSTHORACIC ECHOCARDIOGRAM  08/29/2005    Family History  Problem Relation Age of Onset   Hypertension Father    Heart attack Father 56   Diabetes Brother        X 2   Nephrolithiasis Brother    Heart attack Brother 32   Heart attack Paternal Uncle 19   Sudden death Paternal Grandfather 71       ? heat stroke   Lung cancer Sister        "Asian lung cancer"   Stroke Neg Hx    Colon cancer Neg Hx    Esophageal cancer Neg Hx    Rectal cancer Neg Hx    Stomach cancer Neg Hx     Medications- reviewed and updated Current Outpatient Medications  Medication Sig Dispense Refill   acetaminophen (TYLENOL) 500 MG tablet Take 500 mg by mouth at bedtime.      aspirin 81 MG tablet Take 1 tablet (81 mg total) by mouth every evening.     carvedilol (COREG) 6.25 MG tablet Take 1 tablet (6.25 mg total) by mouth 2 (two) times daily with a meal. 180 tablet 3   Cholecalciferol (VITAMIN D3) 2000 UNITS TABS Take 2,000 Units by mouth daily.     folic acid (FOLVITE) A999333 MCG tablet Take 400 mcg by mouth daily.     furosemide (LASIX) 40 MG tablet Take 40 mg by mouth daily as needed.     Glucosamine HCl 1500 MG TABS Take 1,500 mg by mouth every evening.      losartan (COZAAR) 25 MG tablet Take 1 tablet (25 mg total) by mouth daily. 90 tablet 2   Melatonin 5 MG CHEW Chew by mouth.     Multiple Vitamin (MULTIVITAMIN WITH MINERALS) TABS Take 1 tablet by mouth daily.      mupirocin ointment (BACTROBAN) 2 % Apply topically 2 (two) times daily as needed.     potassium chloride SA (KLOR-CON) 20 MEQ tablet TAKE 1 TABLET BY MOUTH DAILY, GENERIC EQUIVALENT FOR KLOR-CON. 90 tablet 2   simvastatin (ZOCOR) 40 MG tablet Take 0.5 tablets (20 mg total) by mouth at bedtime. 45 tablet 3   spironolactone (ALDACTONE) 25 MG tablet Take 12.5 mg by mouth daily as needed.     tamsulosin (FLOMAX) 0.4 MG CAPS capsule Take 0.4 mg by mouth at bedtime.      warfarin (COUMADIN) 5 MG tablet Take 1/2 tablet daily except take 1 tablet on Sunday and Tuesday or Take as directed by anticoagulation clinic  90 day (Patient taking differently: Take 1/2 tablet daily except take 1 tablet on Sunday and Thursday or Take as directed by anticoagulation clinic  90 day) 90 tablet 3   tiZANidine (ZANAFLEX) 2 MG tablet Take 1 tablet (2 mg total) by mouth daily as needed for muscle spasms (do not drive for 8 hours after use). (Patient not taking: Reported on 09/06/2020) 90 tablet 0  No current facility-administered medications for this visit.    Allergies-reviewed and updated Allergies  Allergen Reactions   Penicillins Rash and Other (See Comments)    Because of a history of documented adverse serious drug reaction;Medi Alert bracelet  is recommended Has patient had a PCN reaction causing immediate rash, facial/tongue/throat swelling, SOB or lightheadedness with hypotension: No Has patient had a PCN reaction causing severe rash involving mucus membranes or skin necrosis: No Has patient had a PCN reaction that required hospitalization: No Has patient had a PCN reaction occurring within the last 10 years: No If all of the above answers are "NO", t    Social History   Social History Narrative   Married 1957. 4 children 2 boys 2 girls. 15 grandkids.  4 greatgrandkids.       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       Hobbies: genealogy, travel      No HCPOA-advised to do this.    Objective  Objective:  BP 117/71   Pulse 73   Temp 98 F (36.7 C) (Temporal)   Ht '5\' 11"'$  (1.803 m)   Wt 246 lb 3.2 oz (111.7 kg)   SpO2 95%   BMI 34.34 kg/m  Gen: NAD, resting comfortably HEENT: Mucous membranes are moist. Oropharynx normal Neck: no thyromegaly CV: RRR no murmurs rubs or gallops Lungs: CTAB no crackles, wheeze, rhonchi Abdomen: soft/nontender/nondistended/normal bowel sounds. No rebound or guarding.  Ext:  trace to 1+ edema- increased some Skin: warm, dry Neuro: grossly normal, moves all extremities, PERRLA    Assessment and Plan  87 y.o. male presenting for annual physical.  Health Maintenance counseling: 1. Anticipatory guidance: Patient counseled regarding regular dental exams -q6 months, eye exams -yearly- was due for an ophthalmology exam 08/08/2020,  avoiding smoking and second hand smoke , limiting alcohol to 2 beverages per day - never drinks.   2. Risk factor reduction:  Advised patient of need for regular exercise and diet rich and fruits and vegetables to reduce risk of heart attack and stroke. Exercise- see DM section below  . Diet-tries to follow diabetic diet. Patient is up 5lbs.   Wt Readings from Last 3 Encounters:  09/06/20 246 lb 3.2 oz (111.7 kg)  06/26/20 241 lb 9.6 oz (109.6 kg)  06/07/20 242 lb (109.8 kg)  j3. Immunizations/screenings/ancillary studies- discussed Prevnar 20- patient opts in- otherwise up-to-date.  Foot exam today. Immunization History  Administered Date(s) Administered   Fluad Quad(high Dose 65+) 01/20/2020   Influenza, High Dose Seasonal PF 10/12/2018   Influenza,inj,Quad PF,6+ Mos 11/11/2012   Influenza-Unspecified 10/25/2013, 10/27/2014, 10/24/2015, 10/19/2017   PFIZER(Purple Top)SARS-COV-2 Vaccination 03/06/2019, 03/24/2019, 01/20/2020, 06/27/2020   Pneumococcal Conjugate-13 08/21/2014   Pneumococcal Polysaccharide-23 10/02/2009   Td 09/17/2009, 10/31/2019   Zoster Recombinat (Shingrix) 06/27/2020, 08/27/2020   Zoster, Live 12/29/2012   4. Prostate cancer screening- past age based screening recommendations.  No further PSAs or rectal exams planned Lab Results  Component Value Date   PSA 0.58 10/22/2007   PSA 0.67 09/25/2006   5. Colon cancer screening - Aged out. Did have work up 10/2018 for melena.  No further colonoscopies planned unless recurrent issues 6. Skin cancer screening- followed by dermatology as neededd. advised regular sunscreen  use. Denies worrisome, changing, or new skin lesions.  7. never smoker 8. STD screening - monogamous  Status of chronic or acute concerns   #Fall-patient with fall in May and had knee and hand abrasion-both were  appearing well at that time-he was up-to-date on Tdap thankfully-no ongoing issues related fall-was mechanical fall stepping into a hole.  Thankfully no recurrent falls either  #Heart failure with reduced ejection fraction/ ischemic cardiomypopathy- Dr. Haroldine Laws S: Medication:Carvedilol 6.25 mg twice daily (quarter of 25 mg as still has a lot left), Lasix 40 mg as needed (takes calcium with Lasix), losartan 25 mg daily, spironolactone 12.5 mg as needed. Last took lasix a week ago  Edema: up some in ankles Weight gain: Weight up 5 pounds since May. U psome on ome scales Shortness of breath: stable at baseline  A/P: Appears to be slightly fluid overloaded by increased weight and increased edema-we opted to take Lasix 40 mg and spironolactone together for 3 days to see if this will help with swelling.  Can repeat again in a week if needed-let us know if weight does not go back down on home scales or swelling does not improve.     #CAD with defibrillator in place/hyperlipidemia #statin myalgia on higher doser-thankfully LDL has been at goal on simvastatin 40 mg #Apical thrombus-on Coumadin through cardiology clinic S: Medication:Aspirin 81 mg, simvastatin 40 mg-only takes half tablet  No chest pain. Stable shortness of breath reported. Lab Results  Component Value Date   CHOL 149 08/26/2019   HDL 41.60 08/26/2019   LDLCALC 69 08/26/2019   LDLDIRECT 77.0 11/17/2017   TRIG 190.0 (H) 08/26/2019   CHOLHDL 4 08/26/2019   A/P: CAD remains asymptomatic other than baseline SOB  Continue current medications.  For hyperlipidemia update lipid panel-prefer LDL under 70 but hesitant to increase dose above 20 mg of simvastatin due to prior statin myalgias as well as fatigue-cardiology had  actually reduced him originally.  For apical thrombus continue Coumadin.  Also on aspirin through cardiology  #hypertension S: medication: Carvedilol 6.25 mg (quarter of 25 mg tablet- has a lot left) losartan 25 mg. Lasix and spironolactone as needed Home readings #s: home readings have all been good per patient. 120-135, 80-90  BP Readings from Last 3 Encounters:  09/06/20 117/71  06/26/20 110/76  06/07/20 134/84  A/P: Stable. Continue current medications.   # Diabetes- a1c goal 7.5 or less S: Medication:Diet controlled CBGs- Home readings have been between 150-160 fasting  (up from 120-150 last visit) Exercise and diet- doing [employer] twice a week (treadmill, bike, some leg workout) and walks in park where he lives  Lab Results  Component Value Date   HGBA1C 6.5 (A) 02/28/2020   HGBA1C 7.2 (H) 08/26/2019   HGBA1C 7.4 (H) 01/25/2019  A/P: hopefully stable- update a1c today. Continue without meds for now  #Melena history-history of gastric erosions later requiring cauterization-on omeprazole for 3 months in 2020 - now off all rx for GERD  Recommended follow up: Return in about 6 months (around 03/09/2021) for follow-up or sooner if needed. Future Appointments  Date Time Provider Colon  09/26/2020  8:00 AM [facility] DEVICE REMOTES [facility]  10/17/2020 10:30 AM [facility] COUMADIN CLINIC [facility]  12/26/2020  8:00 AM [facility] DEVICE REMOTES [facility]  03/27/2021  8:00 AM [facility] DEVICE REMOTES [facility]  06/26/2021  8:00 AM [facility] DEVICE REMOTES [facility]  09/13/2021  1:00 PM [facility] HEALTH COACH [facility]   Lab/Order associations:NOT  fasting other than tomato juice this AM   ICD-10-CM   1. Preventative health care  Z00.00     2. Hypertension associated with diabetes ([place])  E11.59 CBC with Differential/Platelet   I15.2 Comprehensive metabolic panel  Lipid panel    3. DM (diabetes mellitus) type  II, controlled, with peripheral vascular disorder ([facility])  E11.51 CBC with Differential/Platelet    Comprehensive metabolic panel    Lipid panel    HgB A1c     Meds ordered this encounter  Medications   carvedilol (COREG) 6.25 MG tablet    Sig: Take 1 tablet (6.25 mg total) by mouth 2 (two) times daily with a meal.    Dispense:  180 tablet    Refill:  3    I,Harris Phan,acting as a scribe for Garret Reddish, MD.,have documented all relevant documentation on the behalf of Garret Reddish, MD,as directed by  Garret Reddish, MD while in the presence of Garret Reddish, MD.   I, Garret Reddish, MD, have reviewed all documentation for this visit. The documentation on 09/06/20 for the exam, diagnosis, procedures, and orders are all accurate and complete.   Return precautions advised.  Garret Reddish, MD

## 2020-09-07 ENCOUNTER — Other Ambulatory Visit: Payer: Self-pay

## 2020-09-07 MED ORDER — METFORMIN HCL 500 MG PO TABS: 500.0000 mg | ORAL_TABLET | Freq: Every day | ORAL | 3 refills | Status: DC

## 2020-09-14 ENCOUNTER — Telehealth: Payer: Self-pay

## 2020-09-14 NOTE — Telephone Encounter (Signed)
Patient has a question regarding take two medication together just wants to verify if he is suppose to be taking two together or just one and would also like refill on    . Encourage patient to contact the pharmacy for refills or they can request refills through [place]:  Please schedule appointment if longer than 1 year  NEXT APPOINTMENT DATE:  MEDICATION: spironolactone (ALDACTONE) 25 MG tablet  Is the patient out of medication?   PHARMACY:[id number] - [place], [place] - [address]

## 2020-09-14 NOTE — Telephone Encounter (Signed)
Error

## 2020-09-18 ENCOUNTER — Other Ambulatory Visit: Payer: Self-pay

## 2020-09-18 MED ORDER — SPIRONOLACTONE 25 MG PO TABS: 12.5000 mg | ORAL_TABLET | Freq: Every day | ORAL | 1 refills | Status: DC | PRN

## 2020-09-18 NOTE — Telephone Encounter (Signed)
Medication has been refilled.

## 2020-09-24 ENCOUNTER — Telehealth: Payer: Self-pay | Admitting: Family Medicine

## 2020-09-24 NOTE — Progress Notes (Signed)
  Care Management   Follow Up Note   09/24/2020 Name: Joseph Malone MRN: [id number] DOB: 08-29-1930   Referred by: Marin Olp, MD Reason for referral : No chief complaint on file.   An unsuccessful telephone outreach was attempted today. The patient was referred to the case management team for assistance with care management and care coordination.   Follow Up Plan: The care management team will reach out to the patient again over the next 7 days.   [place]

## 2020-09-26 ENCOUNTER — Ambulatory Visit (INDEPENDENT_AMBULATORY_CARE_PROVIDER_SITE_OTHER): Payer: Medicare Other

## 2020-09-26 DIAGNOSIS — I255 Ischemic cardiomyopathy: Secondary | ICD-10-CM

## 2020-09-26 LAB — CUP PACEART REMOTE DEVICE CHECK
Battery Remaining Longevity: 58 mo
Battery Voltage: 2.97 V
Brady Statistic RV Percent Paced: 0.02 %
Date Time Interrogation Session: 20220817022603
HighPow Impedance: 74 Ohm
Implantable Lead Implant Date: 20150422
Implantable Lead Location: 753860
Implantable Lead Model: 6935
Implantable Pulse Generator Implant Date: 20150422
Lead Channel Impedance Value: 456 Ohm
Lead Channel Impedance Value: 513 Ohm
Lead Channel Pacing Threshold Amplitude: 0.5 V
Lead Channel Pacing Threshold Pulse Width: 0.4 ms
Lead Channel Sensing Intrinsic Amplitude: 13.75 mV
Lead Channel Sensing Intrinsic Amplitude: 13.75 mV
Lead Channel Setting Pacing Amplitude: 2 V
Lead Channel Setting Pacing Pulse Width: 0.4 ms
Lead Channel Setting Sensing Sensitivity: 0.3 mV

## 2020-10-03 DIAGNOSIS — B353 Tinea pedis: Secondary | ICD-10-CM | POA: Diagnosis not present

## 2020-10-16 NOTE — Progress Notes (Signed)
Remote ICD transmission.   

## 2020-10-17 ENCOUNTER — Ambulatory Visit (INDEPENDENT_AMBULATORY_CARE_PROVIDER_SITE_OTHER): Payer: Medicare Other

## 2020-10-17 ENCOUNTER — Other Ambulatory Visit: Payer: Self-pay

## 2020-10-17 DIAGNOSIS — Z7901 Long term (current) use of anticoagulants: Secondary | ICD-10-CM | POA: Diagnosis not present

## 2020-10-17 LAB — POCT INR: INR: 1.9 — AB (ref 2.0–3.0)

## 2020-10-17 NOTE — Progress Notes (Signed)
I have reviewed and agree with note, evaluation, plan.   Minyon Billiter, MD  

## 2020-10-17 NOTE — Patient Instructions (Addendum)
Pre visit review using our clinic review tool, if applicable. No additional management support is needed unless otherwise documented below in the visit note.  Take 1 tablet today (7/27) and then change weekly dose to  take 1/2 tablet daily except 1 tablet on Mondays, Wednesdays and Fridays.  Re-check in 4 weeks.

## 2020-10-29 ENCOUNTER — Telehealth: Payer: Self-pay

## 2020-10-29 NOTE — Telephone Encounter (Signed)
The patient states his ICD beeped once last Thursday or Friday while watching tv. His ICD also beeped while laying in the bed this morning. He would like to know why his device beeped. I asked the patient to send a manual transmission for the nurse to review and give him a call back. He stated he was not home but will send one as soon as he get home. I told him when the nurse see the transmission she will review it. He may not get a call until tomorrow about the transmission.

## 2020-10-30 NOTE — Telephone Encounter (Signed)
Transmission received 10/29/2020. 

## 2020-10-30 NOTE — Telephone Encounter (Signed)
Transmission reviewed, no alerts have occurred to trigger the notifier.    Spoke with pt, he reports he had 2 separate instances where he heard a tone.  The first was a series of 6 or 7 beeps that sounded like it came from another room.  The second occurrence happened yesterday morning as he was waking up for the day, it was just a single short beep that sounded like the series of beeps he heard the other day.  Explaiend to patient the two sounds that his device can emit and when they will occur.  He doesn;'t believe that there are any magnets near him and it definitely was not the [facility] sound.  Advised it doesn't appear that the sound is coming from his device, he should explore other options within his home for the source of the beeping.

## 2020-11-06 ENCOUNTER — Telehealth: Payer: Self-pay | Admitting: Family Medicine

## 2020-11-06 NOTE — Chronic Care Management (AMB) (Signed)
  Care Management  Note   11/06/2020 Name: TIMBER LUCARELLI MRN: [id number] DOB: November 08, 1930  [name] is a 87 y.o. year old male who is a primary care patient of Yong Channel, Brayton Mars, MD. The care management team was consulted for assistance with chronic disease management and care coordination needs.   Mr. Georg was given information about Care Management services today including:  CCM service includes personalized support from designated clinical staff supervised by the physician, including individualized plan of care and coordination with other care providers 24/7 contact phone numbers for assistance for urgent and routine care needs. Service will only be billed when office clinical staff spend 20 minutes or more in a month to coordinate care. Only one practitioner may furnish and bill the service in a calendar month. The patient may stop CCM services at amy time (effective at the end of the month) by phone call to the office staff. The patient will be responsible for cost sharing (co-pay) or up to 20% of the service fee (after annual deductible is met)  Patient agreed to services and verbal consent obtained.  Follow up plan:   Face to Face appointment with care management team member scheduled for: 12/24/20 $RemoveBefor'@130pm'[id number]$   [place]

## 2020-11-21 ENCOUNTER — Telehealth: Payer: Self-pay

## 2020-11-21 ENCOUNTER — Other Ambulatory Visit: Payer: Self-pay

## 2020-11-21 ENCOUNTER — Ambulatory Visit (INDEPENDENT_AMBULATORY_CARE_PROVIDER_SITE_OTHER): Payer: Medicare Other

## 2020-11-21 DIAGNOSIS — Z7901 Long term (current) use of anticoagulants: Secondary | ICD-10-CM

## 2020-11-21 LAB — POCT INR: INR: 2.6 (ref 2.0–3.0)

## 2020-11-21 NOTE — Progress Notes (Signed)
I have reviewed and agree with note, evaluation, plan.   Rhylynn Perdomo, MD  

## 2020-11-21 NOTE — Patient Instructions (Addendum)
Pre visit review using our clinic review tool, if applicable. No additional management support is needed unless otherwise documented below in the visit note.  Continue 1/2 tablet daily except 1 tablet on Mondays, Wednesdays and Fridays.  Re-check in 6 weeks.

## 2020-11-21 NOTE — Telephone Encounter (Signed)
Perfect thanks-yes I agree with visit

## 2020-11-21 NOTE — Telephone Encounter (Signed)
Pt in today for coumadin clinic apt and reported he had a weak spell Friday of last week and had to lay down and did not get up until the next day. He reports these have been occurring for a couple of years about once monthly. He also reported he just started new DM medication and sometimes he does not feel like eating and thinks it could coincide with that. Advised pt he should have apt with PCP and if not wanting to eat, to at least drink juice or soda with sugar and crackers when these episodes occur. Scheduled pt with PCP for 10/14 and advised if he had any further episodes before that apt to contact office. Advised of ER precautions and pt verbalized understanding.

## 2020-11-23 ENCOUNTER — Ambulatory Visit (INDEPENDENT_AMBULATORY_CARE_PROVIDER_SITE_OTHER): Payer: Medicare Other | Admitting: Family Medicine

## 2020-11-23 ENCOUNTER — Other Ambulatory Visit: Payer: Self-pay

## 2020-11-23 ENCOUNTER — Encounter: Payer: Self-pay | Admitting: Family Medicine

## 2020-11-23 VITALS — BP 122/76 | HR 80 | Temp 97.9°F | Ht 71.0 in | Wt 244.0 lb

## 2020-11-23 DIAGNOSIS — I5022 Chronic systolic (congestive) heart failure: Secondary | ICD-10-CM | POA: Diagnosis not present

## 2020-11-23 DIAGNOSIS — E1151 Type 2 diabetes mellitus with diabetic peripheral angiopathy without gangrene: Secondary | ICD-10-CM

## 2020-11-23 DIAGNOSIS — E1159 Type 2 diabetes mellitus with other circulatory complications: Secondary | ICD-10-CM

## 2020-11-23 DIAGNOSIS — R531 Weakness: Secondary | ICD-10-CM | POA: Diagnosis not present

## 2020-11-23 DIAGNOSIS — I255 Ischemic cardiomyopathy: Secondary | ICD-10-CM

## 2020-11-23 DIAGNOSIS — I152 Hypertension secondary to endocrine disorders: Secondary | ICD-10-CM

## 2020-11-23 MED ORDER — METFORMIN HCL ER 500 MG PO TB24: 500.0000 mg | ORAL_TABLET | Freq: Every day | ORAL | 5 refills | Status: DC

## 2020-11-23 NOTE — Patient Instructions (Addendum)
Health Maintenance Due  Topic Date Due   INFLUENZA VACCINE   -Patient will call us back with his flu shot date.  09/10/2020   COVID-19 Vaccine (5 - Booster for [employer] series)   -Consider omicron/bivalent booster at the pharmacy if it has been at least 3 months from having covid and 2 months from a covid shot.  10/28/2020   Hold off Metformin for one week and then start Metformin 500 mg extended release.   Reduce Losartan to half a tablet so 12.5 mg total-I wonder if your low blood pressure has been causing some of your symptoms  Anytime you have an episode either of seeing something you do not think is actually there or feeling weak/dizzy-please note if that is the day you have taken spironolactone and furosemide as well as what your current blood pressure and blood sugar is.  Please give me an update in 2 weeks- is diarrhea better? Is weakness/dizziness better? Is blood pressure still ok?  Please monitor your blood pressure at home-goal less than 135/85   Recommended follow up: Return for as needed for new, worsening, persistent symptoms.  Otherwise keep January visit

## 2020-11-23 NOTE — Progress Notes (Signed)
This is blood  Phone [phone] In person visit   Subjective:   Joseph Malone is a 87 y.o. year old very pleasant male patient who presents for/with See problem oriented charting Chief Complaint  Patient presents with   Hypertension   Diabetes   Chronic Weakness   This visit occurred during the SARS-CoV-2 public health emergency.  Safety protocols were in place, including screening questions prior to the visit, additional usage of staff PPE, and extensive cleaning of exam room while observing appropriate contact time as indicated for disinfecting solutions.   Past Medical History-  Patient Active Problem List   Diagnosis Date Noted   Chronic systolic heart failure ([place]) 12/09/2010    Priority: 1.   Cardiomyopathy, ischemic 10/02/2009    Priority: 1.   Automatic implantable cardioverter-defibrillator in situ 01/16/2009    Priority: 1.   DM (diabetes mellitus) type II, controlled, with peripheral vascular disorder ([place]) 09/14/2008    Priority: 1.   CAD in native artery 09/25/2006    Priority: 1.   Atherosclerosis of aorta ([place]) 05/18/2020    Priority: 2.   Nephrolithiasis 03/24/2016    Priority: 2.   Hyperlipidemia associated with type 2 diabetes mellitus ([place]) 04/22/2007    Priority: 2.   Hypertension associated with diabetes ([place]) 09/25/2006    Priority: 2.   Long term (current) use of anticoagulants 07/08/2017    Priority: 3.   Erectile dysfunction 01/19/2014    Priority: 3.   Basal cell cancer 09/29/2012    Priority: 3.   Solitary pulmonary nodule 07/12/2012    Priority: 3.   BPH without obstruction/lower urinary tract symptoms 10/22/2007    Priority: 3.   Osteoarthritis 09/25/2006    Priority: 3.   Hematuria 09/25/2006    Priority: 3.   PVC's (premature ventricular contractions) 01/24/2020   Melena     Medications- reviewed and updated Current Outpatient Medications  Medication Sig Dispense Refill   acetaminophen (TYLENOL) 500 MG tablet Take 500 mg by  mouth at bedtime.      aspirin 81 MG tablet Take 1 tablet (81 mg total) by mouth every evening.     carvedilol (COREG) 6.25 MG tablet Take 1 tablet (6.25 mg total) by mouth 2 (two) times daily with a meal. 180 tablet 3   Cholecalciferol (VITAMIN D3) 2000 UNITS TABS Take 2,000 Units by mouth daily.     folic acid (FOLVITE) 295 MCG tablet Take 400 mcg by mouth daily.     furosemide (LASIX) 40 MG tablet Take 40 mg by mouth daily as needed.     Glucosamine HCl 1500 MG TABS Take 1,500 mg by mouth every evening.      losartan (COZAAR) 25 MG tablet Take 1 tablet (25 mg total) by mouth daily. 90 tablet 2   Melatonin 5 MG CHEW Chew by mouth.     metFORMIN (GLUCOPHAGE) 500 MG tablet Take 1 tablet (500 mg total) by mouth daily with breakfast. 90 tablet 3   Multiple Vitamin (MULTIVITAMIN WITH MINERALS) TABS Take 1 tablet by mouth daily.      mupirocin ointment (BACTROBAN) 2 % Apply topically 2 (two) times daily as needed.     potassium chloride SA (KLOR-CON) 20 MEQ tablet TAKE 1 TABLET BY MOUTH DAILY, GENERIC EQUIVALENT FOR KLOR-CON. 90 tablet 2   simvastatin (ZOCOR) 40 MG tablet Take 0.5 tablets (20 mg total) by mouth at bedtime. 45 tablet 3   spironolactone (ALDACTONE) 25 MG tablet Take 0.5 tablets (12.5 mg total) by mouth  daily as needed. 30 tablet 1   tamsulosin (FLOMAX) 0.4 MG CAPS capsule Take 0.4 mg by mouth at bedtime.     tiZANidine (ZANAFLEX) 2 MG tablet Take 1 tablet (2 mg total) by mouth daily as needed for muscle spasms (do not drive for 8 hours after use). 90 tablet 0   triamcinolone cream (KENALOG) 0.1 % SMARTSIG:1 Application Topical 2-3 Times Daily     warfarin (COUMADIN) 5 MG tablet Take 1/2 tablet daily except take 1 tablet on Sunday and Tuesday or Take as directed by anticoagulation clinic  90 day (Patient taking differently: Take 1/2 tablet daily except take 1 tablet on Sunday and Thursday or Take as directed by anticoagulation clinic  90 day) 90 tablet 3   No current  facility-administered medications for this visit.     Objective:  BP 122/76 Comment: retak in office  Pulse 80   Temp 97.9 F (36.6 C) (Temporal)   Ht 5\' 11"  (1.803 m)   Wt 244 lb (110.7 kg)   SpO2 98%   BMI 34.03 kg/m  Gen: NAD, resting comfortably CV: RRR other than occasional ectopic beat no murmurs rubs or gallops Lungs: CTAB no crackles, wheeze, rhonchi Ext: Trace edema down from trace to 1+ edema Skin: warm, dry     Assessment and Plan    #Heart failure with reduced ejection fraction/ ischemic cardiomypopathy- Dr. Haroldine Laws S: Medication:Carvedilol 6.25 mg twice daily (quarter of 25 mg as still has a lot left), Lasix 40 mg as needed (takes calcium with Lasix), losartan 25 mg daily, spironolactone 12.5 mg as needed (always takes Lasix and spironolactone together). Last took lasix a week ago  Edema: Edema improved from last visit Weight gain: Weight down 2 pounds from last visit Shortness of breath: stable at baseline  A/P: Heart failure appears slightly improved in regards to volume status.  I am going to try to reduce losartan due to potential orthostatic symptoms/low blood pressure-avoiding falls critically important for him -Also going to monitor to see if symptoms of hallucinations or weakness/lightheadedness are more likely on days that he takes his furosemide and spironolactone together -No chest pain or shortness of breath reported and symptoms of weakness actually improved with exercise-I doubt CAD related  #hypertension #hallucinations S: medication: Carvedilol 6.25 mg twice daily (quarter of 25 mg tablet- has a lot left) losartan 25 mg daily. Lasix and spironolactone as needed - periods where he is feeling weaker at times- has noted blood pressure as low as 90/60 on those days. Symptoms more common from sitting to standing  - years of symptoms overall - was encouraged to come in by nursing team for wellness visit  -symptoms actually improve with exercise -also  some odd thoughts- possible hallucinations- saw himself laying on floor at night, in daytime saw brother who has passed for a few seconds then he was gone- has never had anything happen like that before. He does not think these were dreams.  -has taken spironolactone 12.5 mg and lasix 40 mg- has not recorded yet if days he is having symptoms are days he takes these. He agrees to monitor.  -only difference in meds has been metformin- has had some diarrhea/urgency Home readings #s: As low as 90/60 at home at times BP Readings from Last 3 Encounters:  11/23/20 122/76  09/06/20 117/71  06/26/20 110/76  A/P: Blood pressure potentially overcontrolled-reduce losartan to 12.5 mg.  From AVS "  Hold off Metformin for one week and then start Metformin 500 mg  extended release.   Reduce Losartan to half a tablet so 12.5 mg total-I wonder if your low blood pressure has been causing some of your symptoms  Anytime you have an episode either of seeing something you do not think is actually there or feeling weak/dizzy-please note if that is the day you have taken spironolactone and furosemide as well as what your current blood pressure and blood sugar is.  Please give me an update in 2 weeks- is diarrhea better? Is weakness/dizziness better? Is blood pressure still ok?  Please monitor your blood pressure at home-goal less than 135/85   Recommended follow up: Return for as needed for new, worsening, persistent symptoms.  Otherwise keep January visit " -If persistent symptoms consider repeating/updating blood work-CBC, CMP, TSH -I wonder if symptoms could be related to low blood sugar or low blood pressure loss need for that evaluation or if they are more common on days that his symptoms are stressed such as when taking both diuretics-I wonder if we need to space these out more  # Diabetes- a1c goal 7.5 or less S: Medication:Diet controlled in the past-last visit we started metformin due to elevated A1c.   Getting some diarrhea at times with metformin Lab Results  Component Value Date   HGBA1C 7.7 (H) 09/06/2020   HGBA1C 6.5 (A) 02/28/2020   HGBA1C 7.2 (H) 08/26/2019    A/P: Hopefully A1c improved but too soon for recheck.  I think with diarrhea symptoms we can hold metformin for 1 week and then restart extended release after 1 week to see if this is beneficial for him-he is to update me in 2 weeks about all of his symptoms  Recommended follow up: No follow-ups on file. Future Appointments  Date Time Provider [place]  12/24/2020  1:30 PM [facility] CCM PHARMACIST [facility]  12/26/2020  8:00 AM [facility] DEVICE REMOTES [facility]  01/02/2021 10:30 AM [facility] COUMADIN CLINIC [facility]  03/11/2021 11:00 AM Marin Olp, MD [facility]  03/27/2021  8:00 AM [facility] DEVICE REMOTES [facility]  06/26/2021  8:00 AM [facility] DEVICE REMOTES [facility]  09/09/2021 10:40 AM Marin Olp, MD [facility]  09/13/2021  1:00 PM [facility] HEALTH COACH [facility]    Lab/Order associations:   ICD-10-CM   1. Weakness  R53.1     2. DM (diabetes mellitus) type II, controlled, with peripheral vascular disorder ([place])  E11.51     3. Hypertension associated with diabetes ([place])  E11.59    I15.2     4. Hyperlipidemia associated with type 2 diabetes mellitus ([place])  E11.69    E78.5     5. CAD in native artery  I25.10     6. Cardiomyopathy, ischemic  I25.5       I,Jada Bradford,acting as a scribe for Garret Reddish, MD.,have documented all relevant documentation on the behalf of Garret Reddish, MD,as directed by  Garret Reddish, MD while in the presence of Garret Reddish, MD.   I, Garret Reddish, MD, have reviewed all documentation for this visit. The documentation on 11/23/20 for the exam, diagnosis, procedures, and orders are all accurate and complete.   Return precautions advised.  Garret Reddish, MD

## 2020-11-26 ENCOUNTER — Telehealth: Payer: Self-pay

## 2020-11-26 NOTE — Telephone Encounter (Signed)
Pt called regarding his flu shot. He stated that he received his flu shot on 10/23/20.

## 2020-11-26 NOTE — Telephone Encounter (Signed)
Flu shot has been documented.

## 2020-12-13 NOTE — Telephone Encounter (Signed)
Patient brought in 3 pages worth of handwritten information about blood pressures and blood sugars.  Thankfully he reports weak/dizzy spells are much better.  He had a one-time episode of diarrhea after taking metformin-it is certainly possibly related to that but since has not recurred would continue medication.  Blood pressure is typically under 150 with average appearing to be less than 140/90.  Blood sugars have been high often above 200-certainly helpful metformin will help with this.   He would like to keep January follow-up I think that is reasonable unless he has new or worsening symptoms  Please tell him thank you for dropping off this information-extremely helpful

## 2020-12-19 ENCOUNTER — Telehealth: Payer: Self-pay | Admitting: Family Medicine

## 2020-12-19 NOTE — Progress Notes (Signed)
  Care Management  Note   12/19/2020 Name: Joseph Malone MRN: [id number] DOB: 11-10-1930  [name] is a 87 y.o. year old male who is a primary care patient of Yong Channel, Brayton Mars, MD. The care management team was consulted for assistance with chronic disease management and care coordination needs.   Joseph Malone was given information about Care Management services today including:  CCM service includes personalized support from designated clinical staff supervised by the physician, including individualized plan of care and coordination with other care providers 24/7 contact phone numbers for assistance for urgent and routine care needs. Service will only be billed when office clinical staff spend 20 minutes or more in a month to coordinate care. Only one practitioner may furnish and bill the service in a calendar month. The patient may stop CCM services at amy time (effective at the end of the month) by phone call to the office staff. The patient will be responsible for cost sharing (co-pay) or up to 20% of the service fee (after annual deductible is met)  Patient agreed to services and verbal consent obtained.  Follow up plan:   Face to Face appointment with care management team member scheduled for: 12/24/20 $RemoveBefor'@130pm'[email or web address]$   Noelle Penner Upstream Scheduler

## 2020-12-21 ENCOUNTER — Telehealth: Payer: Self-pay | Admitting: Pharmacist

## 2020-12-21 NOTE — Progress Notes (Addendum)
Chronic Care Management Pharmacy Assistant   Name: Joseph Malone  MRN: [id number] DOB: [birth year]  Joseph Malone is an 87 y.o. year old male who presents for his initial CCM visit with the clinical pharmacist.  Reason for Encounter: Chart Prep for Initial visit with CPP on 12/24/20 @ 1:30 PM in office     Recent office visits:  11/23/20 Garret Reddish, MD (PCP) - Family Medicine - Weakness - Reduce Losartan to half a tablet so 12.5 mg total-I wonder if your low blood pressure has been causing some of your symptoms. Return if symptoms worsen, otherwise follow up in Jan as scheduled. May consider ordering CBC, CMP, TSH if no improvement prior to follow up.   06/26/20 Garret Reddish, MD (PCP) - Family Medicine - Fall Encounter -  After care instructions given. Follow up in July as scheduled.   Recent consult visits:  None noted.   Hospital visits:  None in previous 6 months  Medications: Outpatient Encounter Medications as of 12/21/2020  Medication Sig   acetaminophen (TYLENOL) 500 MG tablet Take 500 mg by mouth at bedtime.    aspirin 81 MG tablet Take 1 tablet (81 mg total) by mouth every evening.   carvedilol (COREG) 6.25 MG tablet Take 1 tablet (6.25 mg total) by mouth 2 (two) times daily with a meal.   Cholecalciferol (VITAMIN D3) 2000 UNITS TABS Take 2,000 Units by mouth daily.   folic acid (FOLVITE) 629 MCG tablet Take 400 mcg by mouth daily.   furosemide (LASIX) 40 MG tablet Take 40 mg by mouth daily as needed.   Glucosamine HCl 1500 MG TABS Take 1,500 mg by mouth every evening.    losartan (COZAAR) 25 MG tablet Take 1 tablet (25 mg total) by mouth daily.   Melatonin 5 MG CHEW Chew by mouth.   metFORMIN (GLUCOPHAGE XR) 500 MG 24 hr tablet Take 1 tablet (500 mg total) by mouth daily with breakfast.   Multiple Vitamin (MULTIVITAMIN WITH MINERALS) TABS Take 1 tablet by mouth daily.    mupirocin ointment (BACTROBAN) 2 % Apply topically 2 (two) times daily as needed.    potassium chloride SA (KLOR-CON) 20 MEQ tablet TAKE 1 TABLET BY MOUTH DAILY, GENERIC EQUIVALENT FOR KLOR-CON.   simvastatin (ZOCOR) 40 MG tablet Take 0.5 tablets (20 mg total) by mouth at bedtime.   spironolactone (ALDACTONE) 25 MG tablet Take 0.5 tablets (12.5 mg total) by mouth daily as needed.   tamsulosin (FLOMAX) 0.4 MG CAPS capsule Take 0.4 mg by mouth at bedtime.   tiZANidine (ZANAFLEX) 2 MG tablet Take 1 tablet (2 mg total) by mouth daily as needed for muscle spasms (do not drive for 8 hours after use).   triamcinolone cream (KENALOG) 0.1 % SMARTSIG:1 Application Topical 2-3 Times Daily   warfarin (COUMADIN) 5 MG tablet Take 1/2 tablet daily except take 1 tablet on Sunday and Tuesday or Take as directed by anticoagulation clinic  90 day (Patient taking differently: Take 1/2 tablet daily except take 1 tablet on Sunday and Thursday or Take as directed by anticoagulation clinic  90 day)   No facility-administered encounter medications on file as of 12/21/2020.     Have you seen any other providers since your last visit?   Any changes in your medications or health?   Any side effects from any medications?   Do you have an symptoms or problems not managed by your medications?   Any concerns about your health right now?   Has your  provider asked that you check blood pressure, blood sugar, or follow special diet at home?   Do you get any type of exercise on a regular basis?   Can you think of a goal you would like to reach for your health?   Do you have any problems getting your medications?   Is there anything that you would like to discuss during the appointment?   Please bring medications and supplements to appointment. Patient confirmed appointment date and time.    Care Gaps  AWV: done 09/03/20 Colonoscopy: done 09/28/02 DM Eye Exam: due 08/10/21 DM Foot Exam: due 09/06/21 Microalbumin: done 05/17/20 HbgAIC: done 09/06/20 (7.7) DEXA: N/A Mammogram: N/A  Star Rating  Drugs: simvastatin (ZOCOR) 40 MG tablet - last filled 10/08/20 90 days  metFORMIN (GLUCOPHAGE XR) 500 MG 24 hr tablet - last filled 11/23/20 90 days  Losartan 25 mg tablet - last filled 10/25/20 90 days (reduced on 11/23/20)  Future Appointments  Date Time Provider [place]  12/24/2020  1:30 PM [facility] CCM PHARMACIST [facility]  12/26/2020  8:00 AM [facility] DEVICE REMOTES [facility]  01/02/2021 10:30 AM [facility] COUMADIN CLINIC [facility]  03/11/2021 11:00 AM Marin Olp, MD [facility]  03/27/2021  8:00 AM [facility] DEVICE REMOTES [facility]  06/26/2021  8:00 AM [facility] DEVICE REMOTES [facility]  09/09/2021 10:40 AM Marin Olp, MD [facility]  09/13/2021  1:00 PM [facility] HEALTH COACH [facility]   Multiple attempts were made to contact patient. Attempts were unsuccessful. / ls,CMA   Jobe Gibbon, [place] Pharmacist Assistant  [phone]

## 2020-12-21 NOTE — Progress Notes (Signed)
Chronic Care Management Pharmacy Note  12/27/2020 Name:  Joseph Malone MRN:  [id number] DOB:  1930-03-31  Summary: Initial visit with PharmD.  Discussed changes to diet to improve glucose.  Experiencing GI upset (diarrhea) with metformin.  Encouraged to continue to hopefully resolve with time.  New losartan dose seems to be going well.  Still occasional dizziness, go slow when switching positions!  Recommendations/Changes made from today's visit: Diet changes - limiting carbs (rice, breads) and servings of juices.  Recheck A1c consider titration of metformin if tolerating.   Plan: Fu 4 months   Subjective: Joseph Malone is an 87 y.o. year old male who is a primary patient of Yong Channel, Brayton Mars, MD.  The CCM team was consulted for assistance with disease management and care coordination needs.    Engaged with patient face to face for initial visit in response to provider referral for pharmacy case management and/or care coordination services.   Consent to Services:  The patient was given the following information about Chronic Care Management services today, agreed to services, and gave verbal consent: 1. CCM service includes personalized support from designated clinical staff supervised by the primary care provider, including individualized plan of care and coordination with other care providers 2. 24/7 contact phone numbers for assistance for urgent and routine care needs. 3. Service will only be billed when office clinical staff spend 20 minutes or more in a month to coordinate care. 4. Only one practitioner may furnish and bill the service in a calendar month. 5.The patient may stop CCM services at any time (effective at the end of the month) by phone call to the office staff. 6. The patient will be responsible for cost sharing (co-pay) of up to 20% of the service fee (after annual deductible is met). Patient agreed to services and consent obtained.  Patient Care Team: Marin Olp, MD as PCP - General (Family Medicine) [name], MD as PCP - Electrophysiology (Cardiology) Bensimhon, Shaune Pascal, MD as PCP - Advanced Heart Failure (Cardiology) Festus Aloe, MD as Consulting Physician (Urology) Allyn Kenner, MD (Dermatology) Edythe Clarity, [facility] as Pharmacist (Pharmacist)  Recent office visits:  11/23/20 Garret Reddish, MD (PCP) - Family Medicine - Weakness - Reduce Losartan to half a tablet so 12.5 mg total-I wonder if your low blood pressure has been causing some of your symptoms. Return if symptoms worsen, otherwise follow up in Jan as scheduled. May consider ordering CBC, CMP, TSH if no improvement prior to follow up.    06/26/20 Garret Reddish, MD (PCP) - Family Medicine - Fall Encounter -  After care instructions given. Follow up in July as scheduled.    Recent consult visits:  None noted.    Hospital visits:  None in previous 6 months   Objective:  Lab Results  Component Value Date   CREATININE 1.18 09/06/2020   BUN 21 09/06/2020   GFR 55.07 (L) 09/06/2020   GFRNONAA 40 (L) 10/01/2018   GFRAA 46 (L) 10/01/2018   NA 138 09/06/2020   K 4.2 09/06/2020   CALCIUM 9.1 09/06/2020   CO2 24 09/06/2020   GLUCOSE 179 (H) 09/06/2020    Lab Results  Component Value Date/Time   HGBA1C 7.7 (H) 09/06/2020 11:34 AM   HGBA1C 6.5 (A) 02/28/2020 01:24 PM   HGBA1C 7.2 (H) 08/26/2019 02:10 PM   GFR 55.07 (L) 09/06/2020 11:34 AM   GFR 51.01 (L) 05/17/2020 10:11 AM   MICROALBUR 1.3 05/17/2020 10:11 AM  MICROALBUR 1.1 01/11/2016 08:36 AM    Last diabetic Eye exam:  Lab Results  Component Value Date/Time   HMDIABEYEEXA No Retinopathy 08/10/2020 12:00 AM    Last diabetic Foot exam: No results found for: HMDIABFOOTEX   Lab Results  Component Value Date   CHOL 148 09/06/2020   HDL 42.80 09/06/2020   LDLCALC 80 09/06/2020   LDLDIRECT 77.0 11/17/2017   TRIG 123.0 09/06/2020   CHOLHDL 3 09/06/2020    Hepatic Function Latest Ref Rng & Units  09/06/2020 05/17/2020 04/30/2020  Total Protein 6.0 - 8.3 g/dL 6.9 6.7 6.8  Albumin 3.5 - 5.2 g/dL 3.9 4.0 3.9  AST 0 - 37 U/L 20 19 23   ALT 0 - 53 U/L 20 20 23   Alk Phosphatase 39 - 117 U/L 54 61 63  Total Bilirubin 0.2 - 1.2 mg/dL 0.4 0.4 0.7  Bilirubin, Direct 0.0 - 0.3 mg/dL - - -    Lab Results  Component Value Date/Time   TSH 0.76 05/17/2020 10:11 AM   TSH 0.47 02/28/2020 01:54 PM   FREET4 0.82 10/22/2010 09:34 AM    CBC Latest Ref Rng & Units 09/06/2020 05/17/2020 04/30/2020  WBC 4.0 - 10.5 K/uL 6.7 6.9 12.6(H)  Hemoglobin 13.0 - 17.0 g/dL 13.9 14.3 14.5  Hematocrit 39.0 - 52.0 % 41.5 42.6 41.5  Platelets 150.0 - 400.0 K/uL 167.0 201.0 194.0    No results found for: VD25OH  Clinical ASCVD: Yes  The ASCVD Risk score (Arnett DK, et al., 2019) failed to calculate for the following reasons:   The 2019 ASCVD risk score is only valid for ages 49 to 35    Depression screen PHQ 2/9 11/23/2020 09/03/2020 06/26/2020  Decreased Interest 0 0 0  Down, Depressed, Hopeless 0 0 0  PHQ - 2 Score 0 0 0  Altered sleeping - - 0  Tired, decreased energy - - 0  Change in appetite - - 0  Feeling bad or failure about yourself  - - 0  Trouble concentrating - - 0  Moving slowly or fidgety/restless - - 0  Suicidal thoughts - - 0  PHQ-9 Score - - 0  Difficult doing work/chores - - Not difficult at all  Some recent data might be hidden    Social History   Tobacco Use  Smoking Status Never   Passive exposure: Yes  Smokeless Tobacco Never   BP Readings from Last 3 Encounters:  11/23/20 122/76  09/06/20 117/71  06/26/20 110/76   Pulse Readings from Last 3 Encounters:  11/23/20 80  09/06/20 73  06/26/20 (!) 44   Wt Readings from Last 3 Encounters:  11/23/20 244 lb (110.7 kg)  09/06/20 246 lb 3.2 oz (111.7 kg)  06/26/20 241 lb 9.6 oz (109.6 kg)   BMI Readings from Last 3 Encounters:  11/23/20 34.03 kg/m  09/06/20 34.34 kg/m  06/26/20 33.70 kg/m    Assessment/Interventions:  Review of patient past medical history, allergies, medications, health status, including review of consultants reports, laboratory and other test data, was performed as part of comprehensive evaluation and provision of chronic care management services.   SDOH:  (Social Determinants of Health) assessments and interventions performed: Yes  Financial Resource Strain: Low Risk    Difficulty of Paying Living Expenses: Not hard at all    SDOH Screenings   Alcohol Screen: Not on file  Depression (PHQ2-9): Low Risk    PHQ-2 Score: 0  Financial Resource Strain: Low Risk    Difficulty of Paying Living Expenses: Not  hard at all  Food Insecurity: No Food Insecurity   Worried About Charity fundraiser in the Last Year: Never true   Ran Out of Food in the Last Year: Never true  Housing: Low Risk    Last Housing Risk Score: 0  Physical Activity: Sufficiently Active   Days of Exercise per Week: 3 days   Minutes of Exercise per Session: 50 min  Social Connections: Engineer, building services of Communication with Friends and Family: More than three times a week   Frequency of Social Gatherings with Friends and Family: More than three times a week   Attends Religious Services: More than 4 times per year   Active Member of [employer] or Organizations: Yes   Attends Archivist Meetings: 1 to 4 times per year   Marital Status: Married  Stress: No Stress Concern Present   Feeling of Stress : Only a little  Tobacco Use: Medium Risk   Smoking Tobacco Use: Never   Smokeless Tobacco Use: Never   Passive Exposure: Yes  Transportation Needs: No Transportation Needs   Lack of Transportation (Medical): No   Lack of Transportation (Non-Medical): No    CCM Care Plan  Allergies  Allergen Reactions   Penicillins Rash and Other (See Comments)    Because of a history of documented adverse serious drug reaction;Medi Alert bracelet  is recommended Has patient had a PCN reaction causing immediate  rash, facial/tongue/throat swelling, SOB or lightheadedness with hypotension: No Has patient had a PCN reaction causing severe rash involving mucus membranes or skin necrosis: No Has patient had a PCN reaction that required hospitalization: No Has patient had a PCN reaction occurring within the last 10 years: No If all of the above answers are "NO", t    Medications Reviewed Today     Reviewed by Edythe Clarity, [facility] (Pharmacist) on 12/27/20 at 1205  Med List Status: <None>   Medication Order Taking? Sig Documenting Provider Last Dose Status Informant  acetaminophen (TYLENOL) 500 MG tablet [id number] Yes Take 500 mg by mouth at bedtime.  [provider] Taking Active Self  aspirin 81 MG tablet [id number] Yes Take 1 tablet (81 mg total) by mouth every evening. Bensimhon, Shaune Pascal, MD Taking Active Self  carvedilol (COREG) 6.25 MG tablet [id number] Yes Take 1 tablet (6.25 mg total) by mouth 2 (two) times daily with a meal. Marin Olp, MD Taking Active   Cholecalciferol (VITAMIN D3) 2000 UNITS TABS [id number] Yes Take 2,000 Units by mouth daily. [provider] Taking Active Self  folic acid (FOLVITE) 759 MCG tablet [id number] Yes Take 400 mcg by mouth daily. [provider] Taking Active Self  furosemide (LASIX) 40 MG tablet [id number] Yes Take 40 mg by mouth daily as needed. [provider] Taking Active            Med Note Rose Phi   Tue Jan 24, 2020  3:56 PM)    Glucosamine HCl 1500 MG TABS [id number] Yes Take 1,500 mg by mouth every evening.  [provider] Taking Active Self  losartan (COZAAR) 25 MG tablet [id number]  Take 1 tablet (25 mg total) by mouth daily. [name], MD  Expired 11/23/20 2359   Melatonin 5 MG CHEW [id number] Yes Chew by mouth. [provider] Taking Active   metFORMIN (GLUCOPHAGE XR) 500 MG 24 hr tablet [id number] Yes Take 1 tablet (500 mg total) by mouth daily with breakfast. Marin Olp, MD  Taking  Active   Multiple Vitamin (MULTIVITAMIN WITH MINERALS) TABS [id number] Yes Take 1 tablet by mouth daily.  [provider] Taking Active Self  mupirocin ointment (BACTROBAN) 2 % [id number] Yes Apply topically 2 (two) times daily as needed. [provider] Taking Active   potassium chloride SA (KLOR-CON) 20 MEQ tablet [id number] Yes TAKE 1 TABLET BY MOUTH DAILY, GENERIC EQUIVALENT FOR KLOR-CON. Bensimhon, Shaune Pascal, MD Taking Active   simvastatin (ZOCOR) 40 MG tablet [id number] Yes Take 0.5 tablets (20 mg total) by mouth at bedtime. Bensimhon, Shaune Pascal, MD Taking Active   spironolactone (ALDACTONE) 25 MG tablet [id number] Yes Take 0.5 tablets (12.5 mg total) by mouth daily as needed. Marin Olp, MD Taking Active   tamsulosin [facility]) 0.4 MG CAPS capsule [id number] Yes Take 0.4 mg by mouth at bedtime. [provider] Taking Active   tiZANidine (ZANAFLEX) 2 MG tablet [id number] Yes Take 1 tablet (2 mg total) by mouth daily as needed for muscle spasms (do not drive for 8 hours after use). Marin Olp, MD Taking Active   triamcinolone cream (KENALOG) 0.1 % [id number] Yes SMARTSIG:1 Application Topical 2-3 Times Daily [provider] Taking Active   warfarin (COUMADIN) 5 MG tablet [id number] Yes Take 1/2 tablet daily except take 1 tablet on Sunday and Tuesday or Take as directed by anticoagulation clinic  90 day  Patient taking differently: Take 1/2 tablet daily except take 1 tablet on Sunday and Thursday or Take as directed by anticoagulation clinic  33 day   Bensimhon, Shaune Pascal, MD Taking Active             Patient Active Problem List   Diagnosis Date Noted   Atherosclerosis of aorta ([place]) 05/18/2020   PVC's (premature ventricular contractions) 01/24/2020   Melena    Long term (current) use of anticoagulants 07/08/2017   Nephrolithiasis 03/24/2016   Erectile dysfunction 01/19/2014   Basal cell cancer 09/29/2012   Solitary pulmonary nodule 07/12/2012    Chronic systolic heart failure ([place]) 12/09/2010   Cardiomyopathy, ischemic 10/02/2009   Automatic implantable cardioverter-defibrillator in situ 01/16/2009   DM (diabetes mellitus) type II, controlled, with peripheral vascular disorder ([place]) 09/14/2008   BPH without obstruction/lower urinary tract symptoms 10/22/2007   Hyperlipidemia associated with type 2 diabetes mellitus ([place]) 04/22/2007   Hypertension associated with diabetes ([place]) 09/25/2006   CAD in native artery 09/25/2006   Osteoarthritis 09/25/2006   Hematuria 09/25/2006    Immunization History  Administered Date(s) Administered   Fluad Quad(high Dose 65+) 01/20/2020   Influenza, High Dose Seasonal PF 10/12/2018, 10/24/2020   Influenza,inj,Quad PF,6+ Mos 11/11/2012   Influenza-Unspecified 10/25/2013, 10/27/2014, 10/24/2015, 10/19/2017   PFIZER(Purple Top)SARS-COV-2 Vaccination 03/06/2019, 03/24/2019, 01/20/2020, 06/27/2020   PNEUMOCOCCAL CONJUGATE-20 09/06/2020   Pneumococcal Conjugate-13 08/21/2014   Pneumococcal Polysaccharide-23 10/02/2009   Td 09/17/2009, 10/31/2019   Zoster Recombinat (Shingrix) 06/27/2020, 08/27/2020   Zoster, Live 12/29/2012    Conditions to be addressed/monitored:  CAD, HF, HTN, Type II DM, HLD, Arthritis  Care Plan : General Pharmacy (Adult)  Updates made by Edythe Clarity, RPH since 12/27/2020 12:00 AM     Problem: CAD, HF, HTN, Type II DM, HLD, Arthritis   Priority: High  Onset Date: 12/27/2020     Long-Range Goal: Patient-Specific Goal   Start Date: 12/24/2020  Expected End Date: 06/26/2021  This Visit's Progress: On track  Priority: High  Note:   Current Barriers:  Uncontrolled glucose  Pharmacist Clinical Goal(s):  Patient will achieve improvement in A1c as evidenced by  labs through collaboration with PharmD and provider.   Interventions: 1:1 collaboration with Marin Olp, MD regarding development and update of comprehensive plan of care as evidenced by provider  attestation and co-signature Inter-disciplinary care team collaboration (see longitudinal plan of care) Comprehensive medication review performed; medication list updated in electronic medical record  Hypertension (BP goal <130/80) -Controlled -Current treatment: Losartan 12.70m daily Spironolactone 231mdaily Carvedilol 6.2551mID -Medications previously tried:  valsartan  -Reports hypotensive/hypertensive symptoms - dizziness or weakness, has improved some with decreased dose of losartan but not a huge difference -Educated on BP goals and benefits of medications for prevention of heart attack, stroke and kidney damage; Exercise goal of 150 minutes per week; Importance of home blood pressure monitoring; -Counseled to monitor BP at home daily to weekly, document, and provide log at future appointments -Recommended to continue current medication Try to increase physical activity as tolerated  Hyperlipidemia/CAD: (LDL goal < 70) -Not ideally controlled -Current treatment: Simvastatin 58m59mily -Medications previously tried: none noted  -Educated on Cholesterol goals;  Benefits of statin for ASCVD risk reduction; Importance of limiting foods high in cholesterol; -Recommended to continue current medication -Most recent LDL not quite at goal, current age would continue same dose.  Work on lifestyle mods  Diabetes (A1c goal <7%) -Controlled -Current medications: Metformin XR 500mg29mly -Medications previously tried: none noted  -Current home glucose readings -Denies hypoglycemic/hyperglycemic symptoms -Current meal patterns:  High carbohydrate content of meals, juices having lots of sugar -Current exercise: minimal -Educated on A1c and blood sugar goals; Carohydrate content of various foods/effect of glucose -Counseled to check feet daily and get yearly eye exams -Recommended to continue current medication Really work hard on limiting carbs in diet and beverages.  Implement  exercise where possible.  He mentions some GI effects with metformin.  Encouraged him to stick with it a few more weeks, hopefully will resolve with time.  Recheck A1c - if elevated and tolerating ok could titrate metformin.   Heart Failure (Goal: manage symptoms and prevent exacerbations) -Controlled -Last ejection fraction: 35%   -Current treatment: Carvedilol 6.25mg 83mFurosemide 58mg d93m -Medications previously tried: none noted  -Educated on Benefits of medications for managing symptoms and prolonging life Importance of weighing daily; if you gain more than 3 pounds in one day or 5 pounds in one week, contact providers Importance of blood pressure control Limit added salts in diet -Counseled on diet and exercise extensively Recommended to continue current medication Monitor weight for excess fluid.  Also stay hydrated because he is taking two meds that will cause fluid loss.  Dehydration could also be a source of his weakness.   Patient Goals/Self-Care Activities Patient will:  - take medications as prescribed as evidenced by patient report and record review check blood pressure weekly, document, and provide at future appointments weigh daily, and contact provider if weight gain of 3 lbs in one day or 5 lbs in one week Work on lifestyle mods to improve glucose  Follow Up Plan: The care management team will reach out to the patient again over the next 120 days.        Medication Assistance: None required.  Patient affirms current coverage meets needs.  Compliance/Adherence/Medication fill history: Care Gaps: None at this time  Star-Rating Drugs: Simvastatin 58mg da35m08/29/22 90ds Metformin ER 500mg 10/8m2 90ds  Patient's preferred pharmacy is:  [place] [place]  [phone] Fax: [place], [place] - [address] [place] [address] [place] [address] [place] [address] Phone: [phone] Fax: [phone]  [facility] [place], [place] [address] Phone: [phone] Fax: [place], [address] [place] [address] Phone: [phone] Fax: [phone]  Uses pill box? Yes Pt endorses 100% compliance  We discussed: Benefits of medication synchronization, packaging and delivery as well as enhanced pharmacist oversight with [employer]. Patient decided to: Continue current medication management strategy  Care Plan and Follow Up Patient Decision:  Patient agrees to Care Plan and Follow-up.  Plan: The care management team will reach out to the patient again over the next 120 days.  Beverly Milch, PharmD Clinical Pharmacist  [facility] [phone]

## 2020-12-24 ENCOUNTER — Other Ambulatory Visit: Payer: Self-pay

## 2020-12-24 ENCOUNTER — Ambulatory Visit (INDEPENDENT_AMBULATORY_CARE_PROVIDER_SITE_OTHER): Payer: Medicare Other | Admitting: Pharmacist

## 2020-12-24 DIAGNOSIS — I2510 Atherosclerotic heart disease of native coronary artery without angina pectoris: Secondary | ICD-10-CM

## 2020-12-24 DIAGNOSIS — E1159 Type 2 diabetes mellitus with other circulatory complications: Secondary | ICD-10-CM

## 2020-12-24 DIAGNOSIS — E1151 Type 2 diabetes mellitus with diabetic peripheral angiopathy without gangrene: Secondary | ICD-10-CM

## 2020-12-24 DIAGNOSIS — E1169 Type 2 diabetes mellitus with other specified complication: Secondary | ICD-10-CM

## 2020-12-24 DIAGNOSIS — I5022 Chronic systolic (congestive) heart failure: Secondary | ICD-10-CM

## 2020-12-26 ENCOUNTER — Telehealth: Payer: Self-pay | Admitting: Internal Medicine

## 2020-12-26 NOTE — Telephone Encounter (Signed)
  1. Has your device fired? NO  2. Is you device beeping? NO  3. Are you experiencing draining or swelling at device site? NO  4. Are you calling to see if we received your device transmission? YES  5. Have you passed out? NO      Please route to [place]

## 2020-12-27 NOTE — Patient Instructions (Addendum)
Visit Information   Goals Addressed             This Visit's Progress    Set My Target A1C-Diabetes Type 2       Timeframe:  Long-Range Goal Priority:  High Start Date:  12/27/20                           Expected End Date: 06/26/21                      Follow Up Date 03/29/21    A1c goal < 7.0   Why is this important?   Your target A1C is decided together by you and your doctor.  It is based on several things like your age and other health issues.    Notes:        Patient Care Plan: General Pharmacy (Adult)     Problem Identified: CAD, HF, HTN, Type II DM, HLD, Arthritis   Priority: High  Onset Date: 12/27/2020     Long-Range Goal: Patient-Specific Goal   Start Date: 12/24/2020  Expected End Date: 06/26/2021  This Visit's Progress: On track  Priority: High  Note:   Current Barriers:  Uncontrolled glucose  Pharmacist Clinical Goal(s):  Patient will achieve improvement in A1c as evidenced by labs through collaboration with PharmD and provider.   Interventions: 1:1 collaboration with Marin Olp, MD regarding development and update of comprehensive plan of care as evidenced by provider attestation and co-signature Inter-disciplinary care team collaboration (see longitudinal plan of care) Comprehensive medication review performed; medication list updated in electronic medical record  Hypertension (BP goal <130/80) -Controlled -Current treatment: Losartan 12.5mg  daily Spironolactone 25mg  daily Carvedilol 6.25mg  BID -Medications previously tried:  valsartan  -Reports hypotensive/hypertensive symptoms - dizziness or weakness, has improved some with decreased dose of losartan but not a huge difference -Educated on BP goals and benefits of medications for prevention of heart attack, stroke and kidney damage; Exercise goal of 150 minutes per week; Importance of home blood pressure monitoring; -Counseled to monitor BP at home daily to weekly, document, and  provide log at future appointments -Recommended to continue current medication Try to increase physical activity as tolerated  Hyperlipidemia/CAD: (LDL goal < 70) -Not ideally controlled -Current treatment: Simvastatin 40mg  daily -Medications previously tried: none noted  -Educated on Cholesterol goals;  Benefits of statin for ASCVD risk reduction; Importance of limiting foods high in cholesterol; -Recommended to continue current medication -Most recent LDL not quite at goal, current age would continue same dose.  Work on lifestyle mods  Diabetes (A1c goal <7%) -Controlled -Current medications: Metformin XR 500mg  daily -Medications previously tried: none noted  -Current home glucose readings -Denies hypoglycemic/hyperglycemic symptoms -Current meal patterns:  High carbohydrate content of meals, juices having lots of sugar -Current exercise: minimal -Educated on A1c and blood sugar goals; Carohydrate content of various foods/effect of glucose -Counseled to check feet daily and get yearly eye exams -Recommended to continue current medication Really work hard on limiting carbs in diet and beverages.  Implement exercise where possible.  He mentions some GI effects with metformin.  Encouraged him to stick with it a few more weeks, hopefully will resolve with time.  Recheck A1c - if elevated and tolerating ok could titrate metformin.   Heart Failure (Goal: manage symptoms and prevent exacerbations) -Controlled -Last ejection fraction: 35%   -Current treatment: Carvedilol 6.25mg  BID Furosemide 40mg  daily -Medications previously tried: none noted  -  Educated on Benefits of medications for managing symptoms and prolonging life Importance of weighing daily; if you gain more than 3 pounds in one day or 5 pounds in one week, contact providers Importance of blood pressure control Limit added salts in diet -Counseled on diet and exercise extensively Recommended to continue current  medication Monitor weight for excess fluid.  Also stay hydrated because he is taking two meds that will cause fluid loss.  Dehydration could also be a source of his weakness.   Patient Goals/Self-Care Activities Patient will:  - take medications as prescribed as evidenced by patient report and record review check blood pressure weekly, document, and provide at future appointments weigh daily, and contact provider if weight gain of 3 lbs in one day or 5 lbs in one week Work on lifestyle mods to improve glucose  Follow Up Plan: The care management team will reach out to the patient again over the next 120 days.       Joseph Malone was given information about Chronic Care Management services today including:  CCM service includes personalized support from designated clinical staff supervised by his physician, including individualized plan of care and coordination with other care providers 24/7 contact phone numbers for assistance for urgent and routine care needs. Standard insurance, coinsurance, copays and deductibles apply for chronic care management only during months in which we provide at least 20 minutes of these services. Most insurances cover these services at 100%, however patients may be responsible for any copay, coinsurance and/or deductible if applicable. This service may help you avoid the need for more expensive face-to-face services. Only one practitioner may furnish and bill the service in a calendar month. The patient may stop CCM services at any time (effective at the end of the month) by phone call to the office staff.  Patient agreed to services and verbal consent obtained.   The patient verbalized understanding of instructions, educational materials, and care plan provided today and agreed to receive a mailed copy of patient instructions, educational materials, and care plan.  Telephone follow up appointment with pharmacy team member scheduled for: 4 months  Edythe Clarity, [place]

## 2020-12-27 NOTE — Telephone Encounter (Signed)
Frances Furbish gave the patient verbal instructions on sending the transmission. The patient verbalized understanding.

## 2020-12-27 NOTE — Telephone Encounter (Signed)
I called the patient to follow up to see if he was able to send the transmission with his home remote monitor? He states it would not go through. I had him try why I was on the phone. He received the error code 3248. I called tech support and they are sending him a new handheld in 2-4 weeks. He will try to send the transmission when he receive the new handheld.

## 2020-12-28 ENCOUNTER — Other Ambulatory Visit (HOSPITAL_COMMUNITY): Payer: Self-pay | Admitting: Cardiology

## 2020-12-28 DIAGNOSIS — E785 Hyperlipidemia, unspecified: Secondary | ICD-10-CM

## 2020-12-28 MED ORDER — SIMVASTATIN 40 MG PO TABS: 20.0000 mg | ORAL_TABLET | Freq: Every day | ORAL | 0 refills | Status: DC

## 2021-01-01 ENCOUNTER — Telehealth: Payer: Self-pay

## 2021-01-01 NOTE — Telephone Encounter (Signed)
Pt called requesting to speak to a nurse. Pt stated that he his breaking out in hives. He believes that it is from taking Metformin. I transferred pt to triage. Please Advise.

## 2021-01-01 NOTE — Telephone Encounter (Signed)
Noted ..waiting for triage note

## 2021-01-01 NOTE — Telephone Encounter (Signed)
fyi

## 2021-01-01 NOTE — Telephone Encounter (Signed)
Patient Name: Joseph Malone Gender: Male DOB: [birth year] Age: 87 Y 10 M 20 D Return Phone Number: [phone] (Primary) Address: City/ State/ Zip: [place]  [address] Client [place] [facility] [place] Client Site [place] [facility] [place] [facility] Provider Garret Reddish- MD Contact Type Call Who Is Calling Patient / Member / Family / Caregiver Call Type Triage / Clinical Relationship To Patient Self Return Phone Number [phone] (Primary) Chief Complaint Insect Bite Reason for Call Symptomatic / Request for [place] states he is breaking out with welts thinking it is bed bugs Translation No Nurse Assessment Nurse: Ottis Stain, RN, Sherrie Date/Time (Eastern Time): 01/01/2021 12:19:09 PM Confirm and document reason for call. If symptomatic, describe symptoms. ---Caller states has some reddish, raised bumps (feels like mosquito bites) on upper cheek of right butt. They are itchy and in area the size of hand. Started 2-3 days ago. Does the patient have any new or worsening symptoms? ---Yes Will a triage be completed? ---Yes Related visit to physician within the last 2 weeks? ---No Does the PT have any chronic conditions? (i.e. diabetes, asthma, this includes High risk factors for pregnancy, etc.) ---Yes List chronic conditions. ---Diabetes - Metformin, HTN, Heart surgery 2000 Is this a behavioral health or substance abuse call? ---No Guidelines Guideline Title Affirmed Question Affirmed Notes Nurse Date/Time (Eastern Time) Rash or Redness - Localized Mild localized rash Jennette Bill, [place] 01/01/2021 12:25:52 PM Disp. Time Eilene Ghazi Time) Disposition Final User 01/01/2021 12:33:27 PM Home Care Yes Ottis Stain, RN, Sherrie PLEASE NOTE: All timestamps contained within this report are represented as [place] Standard Time. CONFIDENTIALTY NOTICE: This fax transmission is intended only for the addressee. It  contains information that is legally privileged, confidential or otherwise protected from use or disclosure. If you are not the intended recipient, you are strictly prohibited from reviewing, disclosing, copying using or disseminating any of this information or taking any action in reliance on or regarding this information. If you have received this fax in error, please notify us immediately by telephone so that we can arrange for its return to [place]. Phone: [phone], Toll-Free: [phone], Fax: [phone] Page: 2 of 2 Call Id: [id number] [place] Disagree/Comply Comply Caller Understands Yes PreDisposition Home Care Care Advice Given Per Guideline HOME CARE: * You should be able to treat this at home. REASSURANCE AND EDUCATION - MILD LOCALIZED RASH: * New rashes that are in one small (localized) area are usually due to skin contact with an irritating substance. * Here is some care advice that should help. [place] THE AREA: * Wash the area once thoroughly with soap and water to remove any remaining irritants. Thereafter avoid soaps in this area. * Cleanse the area if needed with warm water. HYDROCORTISONE CREAM FOR ITCHING: * Put 1% hydrocortisone cream on the itchy area(s) 3 times a day. Use it for a couple days, until it feels better. This will help decrease the itching. * This is an over-the-counter (OTC) drug. You can buy it at the drugstore. * Some people like to keep the cream in the refrigerator. It feels even better if the cream is used when it is cold. * Cut your fingernails short. Wash hands frequently with an antibacterial soap. This will help prevent a bacterial skin infection. DON'T SCRATCH: EXPECTED COURSE: * Most of these rashes pass in 2 to 3 days. CALL BACK IF: * Rash spreads or becomes worse * Rash lasts over 1 week * You become worse

## 2021-01-01 NOTE — Telephone Encounter (Signed)
Also probably worth trying off metformin extended release since that was his concern. Can retrial once resolves and if recurs then we will need a different stragety  Can also offer visit if hed like to see me or one of colleagues (pending availability of course). Worsening symptoms we need to know

## 2021-01-02 ENCOUNTER — Other Ambulatory Visit: Payer: Self-pay

## 2021-01-02 ENCOUNTER — Ambulatory Visit (INDEPENDENT_AMBULATORY_CARE_PROVIDER_SITE_OTHER): Payer: Medicare Other

## 2021-01-02 DIAGNOSIS — Z7901 Long term (current) use of anticoagulants: Secondary | ICD-10-CM | POA: Diagnosis not present

## 2021-01-02 LAB — POCT INR: INR: 2.8 (ref 2.0–3.0)

## 2021-01-02 MED ORDER — WARFARIN SODIUM 5 MG PO TABS
ORAL_TABLET | ORAL | 1 refills | Status: DC
Start: 2021-01-02 — End: 2021-10-01

## 2021-01-02 NOTE — Patient Instructions (Addendum)
Pre visit review using our clinic review tool, if applicable. No additional management support is needed unless otherwise documented below in the visit note.  Continue 1/2 tablet daily except 1 tablet on Mondays, Wednesdays and Fridays.  Re-check in 6 weeks.

## 2021-01-02 NOTE — Progress Notes (Signed)
I have reviewed and agree with note, evaluation, plan.   Deaire Mcwhirter, MD  

## 2021-01-02 NOTE — Progress Notes (Signed)
Continue 1/2 tablet daily except 1 tablet on Mondays, Wednesdays and Fridays.  Re-check in 6 weeks.

## 2021-01-09 DIAGNOSIS — E1169 Type 2 diabetes mellitus with other specified complication: Secondary | ICD-10-CM

## 2021-01-09 DIAGNOSIS — I110 Hypertensive heart disease with heart failure: Secondary | ICD-10-CM | POA: Diagnosis not present

## 2021-01-09 DIAGNOSIS — E1151 Type 2 diabetes mellitus with diabetic peripheral angiopathy without gangrene: Secondary | ICD-10-CM

## 2021-01-09 DIAGNOSIS — E785 Hyperlipidemia, unspecified: Secondary | ICD-10-CM | POA: Diagnosis not present

## 2021-01-09 DIAGNOSIS — E1159 Type 2 diabetes mellitus with other circulatory complications: Secondary | ICD-10-CM

## 2021-01-09 DIAGNOSIS — I5022 Chronic systolic (congestive) heart failure: Secondary | ICD-10-CM | POA: Diagnosis not present

## 2021-01-09 DIAGNOSIS — I2510 Atherosclerotic heart disease of native coronary artery without angina pectoris: Secondary | ICD-10-CM

## 2021-01-24 ENCOUNTER — Telehealth: Payer: Self-pay

## 2021-01-24 NOTE — Telephone Encounter (Signed)
Patient is calling in stating he was put on medication to help control his blood sugar, but has noticed  his blood sugar is running 140-150. Wanting to know what else he can do to help control it as he has cut back on a lot of sugary foods and bread.

## 2021-01-24 NOTE — Telephone Encounter (Signed)
Please advise. Would you prefer he schedule a follow up?

## 2021-01-24 NOTE — Telephone Encounter (Signed)
Patient also states that when he picks up an item, tries to get up from cough or a seat he notices he has chest pain. Didn't know if it was muscle related or not. I did have patient triaged as he was concerned about it.

## 2021-01-24 NOTE — Telephone Encounter (Signed)
I agree he needs to be seen.  If he has any exertional chest pain he is to seek care immediately.

## 2021-01-25 ENCOUNTER — Ambulatory Visit (INDEPENDENT_AMBULATORY_CARE_PROVIDER_SITE_OTHER): Payer: Medicare Other | Admitting: Family Medicine

## 2021-01-25 ENCOUNTER — Other Ambulatory Visit: Payer: Self-pay

## 2021-01-25 ENCOUNTER — Telehealth: Payer: Self-pay

## 2021-01-25 ENCOUNTER — Encounter: Payer: Self-pay | Admitting: Family Medicine

## 2021-01-25 VITALS — BP 129/71 | HR 69 | Temp 98.0°F | Ht 71.0 in | Wt 237.2 lb

## 2021-01-25 DIAGNOSIS — R0782 Intercostal pain: Secondary | ICD-10-CM

## 2021-01-25 DIAGNOSIS — E1151 Type 2 diabetes mellitus with diabetic peripheral angiopathy without gangrene: Secondary | ICD-10-CM

## 2021-01-25 NOTE — Patient Instructions (Addendum)
Light stretches. Icey hot, etc if needed. If persistant, worse-ER  Chick pea pasta, edemame pasta, butternut squash noodles.   Hope things settle down for you. Merry Christmas

## 2021-01-25 NOTE — Telephone Encounter (Signed)
Nurse Assessment Nurse: Mariel Sleet, RN, Erasmo Downer Date/Time (Eastern Time): 01/24/2021 1:39:30 PM Confirm and document reason for call. If symptomatic, describe symptoms. ---Caller states he has been having some emotional issues for about a year and has been working out at [employer], was doing some exercises and his chest has hurt a little since then. Now hurts when he is picking up something heavy and has had by pass surgery in the past. Is having some shortness of breath gradually worse over the last four or five years  Does the patient have any new or worsening symptoms? ---Yes Will a triage be completed? ---Yes Related visit to physician within the last 2 weeks? ---No Does the PT have any chronic conditions? (i.e. diabetes, asthma, this includes High risk factors for pregnancy, etc.) ---Yes List chronic conditions. ---heart issues Is this a behavioral health or substance abuse call? ---No  Chest Injury - Bending Lifting or Twisting [1] MILD pain AND [2] present > 7 days Joseph Malone 01/24/2021 1:48:34 PM  01/24/2021 1:53:34 PM SEE PCP WITHIN 3 DAYS Yes Mariel Sleet, RN, Erasmo Downer

## 2021-01-25 NOTE — Progress Notes (Signed)
Subjective:     Patient ID: Joseph Malone, male    DOB: [birth year], 87 y.o.   MRN: [id number]  Chief Complaint  Patient presents with   Chest Pain    HPI A lot of stress past 1 wk, daughter's friend just died and her husband died as well-funeral tomorrow.  Pt stressed for her.  Chest hurting since 1999 after heart surgery. Now, getting cp when moving around-Dr. Bensimhon card research. If reaches or takes deep breath, pain center of chest.  Not while lying or walking.  No coughing or increased SOB . No cough  DM-on meds, but GI upset on metformin so on "timed released", but still causing intermitt diarrhea.  Sugars running 140-160 but has been 206(stressed).  Has made dietary changes.  Exercise 2.5x/wk at [facility].   Diarrhea about 3pm.   Health Maintenance Due  Topic Date Due   COVID-19 Vaccine (5 - Booster for [employer] series) 08/22/2020    Past Medical History:  Diagnosis Date   Anxiety    BPH (benign prostatic hypertrophy)    CAD (coronary artery disease)    s/p CABG   CHF (congestive heart failure) (HCC)    Diverticulosis    Diverticulosis of colon without hemorrhage 10/22/2007   Qualifier: Diagnosis of  By: Linna Darner MD, [name]     DOE (dyspnea on exertion)    Excess weight    Fasting hyperglycemia    Generalized weakness    GERD (gastroesophageal reflux disease)    Heart attack ([place]) 2001   Heart failure    CHF due to ischemic CM   History of kidney stones    Hyperlipidemia    Hypertension    Ischemic cardiomyopathy    Microscopic hematuria    [facility]   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    Past Surgical History:  Procedure Laterality Date   APPENDECTOMY     BIOPSY  10/21/2018   Procedure: BIOPSY;  Surgeon: Yetta Flock, MD;  Location: [facility];  Service: Gastroenterology;;   [place]  2005   [employer]; s/p ICD gen change and RV lead revision April 2015   CATARACT EXTRACTION   2008   bilateral with lens implant   COLONOSCOPY  2004   Tics; [facility]   CORONARY ARTERY BYPASS GRAFT  2000   1 vessel   CYSTOSCOPY  06/2012   Dr [name]   ESOPHAGOGASTRODUODENOSCOPY (EGD) WITH PROPOFOL N/A 10/21/2018   Procedure: ESOPHAGOGASTRODUODENOSCOPY (EGD) WITH PROPOFOL;  Surgeon: Yetta Flock, MD;  Location: [facility];  Service: Gastroenterology;  Laterality: N/A;   HOT HEMOSTASIS N/A 10/21/2018   Procedure: HOT HEMOSTASIS (ARGON PLASMA COAGULATION/BICAP);  Surgeon: Yetta Flock, MD;  Location: [facility];  Service: Gastroenterology;  Laterality: N/A;   IMPLANTABLE CARDIOVERTER DEFIBRILLATOR (ICD) GENERATOR CHANGE N/A 06/01/2013   Procedure: ICD GENERATOR CHANGE;  Surgeon: [name], MD;  Location: [facility] CATH LAB;  Service: Cardiovascular;  Laterality: N/A;   LEAD REVISION N/A 06/01/2013   Procedure: LEAD REVISION;  Surgeon: [name], MD;  Location: [facility] CATH LAB;  Service: Cardiovascular;  Laterality: N/A;   PILONIDAL CYST EXCISION     RIGHT/LEFT HEART CATH AND CORONARY/GRAFT ANGIOGRAPHY N/A 06/12/2017   Procedure: RIGHT/LEFT HEART CATH AND CORONARY/GRAFT ANGIOGRAPHY;  Surgeon: [name], MD;  Location: [facility] LAB;  Service: Cardiovascular;  Laterality: N/A;   TRANSTHORACIC ECHOCARDIOGRAM  08/29/2005    Outpatient Medications Prior to Visit  Medication Sig Dispense Refill   acetaminophen (TYLENOL) 500 MG tablet Take 500 mg by mouth at bedtime.      aspirin 81 MG tablet Take 1 tablet (81 mg total) by mouth every evening.     carvedilol (COREG) 6.25 MG tablet Take 1 tablet (6.25 mg total) by mouth 2 (two) times daily with a meal. 180 tablet 3   Cholecalciferol (VITAMIN D3) 2000 UNITS TABS Take 2,000 Units by mouth daily.     folic acid (FOLVITE) 962 MCG tablet Take 400 mcg by mouth daily.     furosemide (LASIX) 40 MG tablet Take 40 mg by mouth daily as needed.     Glucosamine HCl 1500 MG TABS Take 1,500 mg by mouth every evening.       Melatonin 5 MG CHEW Chew by mouth.     metFORMIN (GLUCOPHAGE XR) 500 MG 24 hr tablet Take 1 tablet (500 mg total) by mouth daily with breakfast. 30 tablet 5   Multiple Vitamin (MULTIVITAMIN WITH MINERALS) TABS Take 1 tablet by mouth daily.      mupirocin ointment (BACTROBAN) 2 % Apply topically 2 (two) times daily as needed.     potassium chloride SA (KLOR-CON) 20 MEQ tablet TAKE 1 TABLET BY MOUTH DAILY, GENERIC EQUIVALENT FOR KLOR-CON. 90 tablet 2   simvastatin (ZOCOR) 40 MG tablet Take 0.5 tablets (20 mg total) by mouth at bedtime. 15 tablet 0   spironolactone (ALDACTONE) 25 MG tablet Take 0.5 tablets (12.5 mg total) by mouth daily as needed. 30 tablet 1   tamsulosin (FLOMAX) 0.4 MG CAPS capsule Take 0.4 mg by mouth at bedtime.     tiZANidine (ZANAFLEX) 2 MG tablet Take 1 tablet (2 mg total) by mouth daily as needed for muscle spasms (do not drive for 8 hours after use). 90 tablet 0   triamcinolone cream (KENALOG) 0.1 % SMARTSIG:1 Application Topical 2-3 Times Daily     warfarin (COUMADIN) 5 MG tablet TAKE 1/2 TABLET DAILY BY MOUTH EXCEPT TAKE 1 TABLET ON MONDAYS, WEDNESDAYS AND FRIDAYS OR AS DIRECTED BY ANTICOAGULATION CLINIC 100 tablet 1   losartan (COZAAR) 25 MG tablet Take 1 tablet (25 mg total) by mouth daily. 90 tablet 2   No facility-administered medications prior to visit.    Allergies  Allergen Reactions   Penicillins Rash and Other (See Comments)    Because of a history of documented adverse serious drug reaction;Medi Alert bracelet  is recommended Has patient had a PCN reaction causing immediate rash, facial/tongue/throat swelling, SOB or lightheadedness with hypotension: No Has patient had a PCN reaction causing severe rash involving mucus membranes or skin necrosis: No Has patient had a PCN reaction that required hospitalization: No Has patient had a PCN reaction occurring within the last 10 years: No If all of the above answers are "NO", t   IWL:[id number]/[id number]  except as noted in HPI      Objective:     BP 129/71    Pulse 69    Temp 98 F (36.7 C)    Ht 5\' 11"  (1.803 m)    Wt 237 lb 3.2 oz (107.6 kg)    SpO2 95%    BMI 33.08 kg/m  Wt Readings from Last 3 Encounters:  01/25/21 237 lb 3.2 oz (107.6 kg)  11/23/20 244 lb (110.7 kg)  09/06/20 246 lb 3.2 oz (111.7 kg)        Gen: [facility] NAD WM-lipoma on L forehead. HEENT: NCAT, conjunctiva not injected, sclera nonicteric NECK:  supple, no  thyromegaly, no nodes, no carotid bruits CARDIAC: RRR, S1S2+, no murmur. ICD in place. DP 1+B. No pain to palp chest wall LUNGS: CTAB. No wheezes ABDOMEN:  BS+, soft, NTND, No HSM, no masses EXT:  tr edema MSK: no gross abnormalities.  NEURO: A&O x3.  CN II-XII intact.  PSYCH: normal mood. Good eye contact R shoulder-some impingement.  And tenderness w/reaching and abduction against resistance.   Assessment & Plan:   Problem List Items Addressed This Visit       Cardiovascular and Mediastinum   DM (diabetes mellitus) type II, controlled, with peripheral vascular disorder ([place])   Other Visit Diagnoses     Intercostal pain    -  Primary     1.  Chest wall pain.  Suspect more from shoulder impingement.  Pain is only when he is reaching across his chest or lifting things and moving across his chest.  As he is on Coumadin, cannot use NSAIDs.  He will do Tylenol if needed.  Monitor symptoms.  He is good with reassurance at this point.  If things worsen, increased shortness of breath, pain with ambulation, then he needs to go to the emergency room.  Patient agrees that he feels that things will settle down once the funeral is over with tomorrow. 2.  Type 2 diabetes-getting some loose stools at the end of the day being on metformin.  Given that the patient is almost 87 years old, sugars in the 160s are acceptable.  He does have an appointment with his PCP next month and will get lab work as well.  He will continue to modify his diet (we spent a lot of time  discussing this) and he will also continue on the metformin once a day to see if it is more tolerated with time.  He does not want to change medications at this time and would prefer to leave that to Dr. Yong Channel (I agree with this plan) any problems, he will let us know as well.  No orders of the defined types were placed in this encounter.   Wellington Hampshire., MD

## 2021-02-01 ENCOUNTER — Ambulatory Visit (INDEPENDENT_AMBULATORY_CARE_PROVIDER_SITE_OTHER): Payer: Medicare Other

## 2021-02-01 DIAGNOSIS — I255 Ischemic cardiomyopathy: Secondary | ICD-10-CM

## 2021-02-01 NOTE — Telephone Encounter (Signed)
Transmission received. 02/01/2021

## 2021-02-04 LAB — CUP PACEART REMOTE DEVICE CHECK
Battery Remaining Longevity: 49 mo
Battery Voltage: 2.98 V
Brady Statistic RV Percent Paced: 0.04 %
Date Time Interrogation Session: 20221223133909
HighPow Impedance: 70 Ohm
Implantable Lead Implant Date: 20150422
Implantable Lead Location: 753860
Implantable Lead Model: 6935
Implantable Pulse Generator Implant Date: 20150422
Lead Channel Impedance Value: 418 Ohm
Lead Channel Impedance Value: 475 Ohm
Lead Channel Pacing Threshold Amplitude: 0.5 V
Lead Channel Pacing Threshold Pulse Width: 0.4 ms
Lead Channel Sensing Intrinsic Amplitude: 11.375 mV
Lead Channel Sensing Intrinsic Amplitude: 11.375 mV
Lead Channel Setting Pacing Amplitude: 2 V
Lead Channel Setting Pacing Pulse Width: 0.4 ms
Lead Channel Setting Sensing Sensitivity: 0.3 mV

## 2021-02-12 NOTE — Progress Notes (Signed)
Remote ICD transmission.   

## 2021-02-13 ENCOUNTER — Ambulatory Visit (INDEPENDENT_AMBULATORY_CARE_PROVIDER_SITE_OTHER): Payer: Medicare Other

## 2021-02-13 ENCOUNTER — Telehealth: Payer: Self-pay

## 2021-02-13 ENCOUNTER — Other Ambulatory Visit: Payer: Self-pay

## 2021-02-13 DIAGNOSIS — I5022 Chronic systolic (congestive) heart failure: Secondary | ICD-10-CM

## 2021-02-13 DIAGNOSIS — Z7901 Long term (current) use of anticoagulants: Secondary | ICD-10-CM

## 2021-02-13 DIAGNOSIS — E1151 Type 2 diabetes mellitus with diabetic peripheral angiopathy without gangrene: Secondary | ICD-10-CM

## 2021-02-13 LAB — POCT INR: INR: 1.8 — AB (ref 2.0–3.0)

## 2021-02-13 NOTE — Progress Notes (Signed)
I have reviewed and agree with note, evaluation, plan.   See separate note-referral was placed  Garret Reddish, MD

## 2021-02-13 NOTE — Telephone Encounter (Signed)
Pt in for coumadin clinic today. He reported he is trying to lose some weight and was told by his son to reduce carbohydrates and eat fruits and vegetables instead of carbs. Pt is not sure what he should be eating. He has been eating a lot of bananas with peanut butter as a dessert item for him and his son said he should not be eating bananas because they are high in sugar.  Made recommendation for a referral to nutritionist/dietician. Pt said he would like a referral if PCP thought it would be beneficial. Advised a msg would be sent to PCP for advisement and that the office would f/u with him. Pt was appreciative and verbalized understanding.

## 2021-02-13 NOTE — Patient Instructions (Addendum)
Pre visit review using our clinic review tool, if applicable. No additional management support is needed unless otherwise documented below in the visit note.  Increase dose to today to take 1 1/2 tablets and then continue 1/2 tablet daily except 1 tablet on Mondays, Wednesdays and Fridays.  Re-check in 4 weeks.

## 2021-02-13 NOTE — Progress Notes (Addendum)
Pt reports he is trying to lose some weight and was told by his son to reduce carbohydrates and eat fruits and vegetables instead of carbs. Pt is not sure what he should be eating. He has been eating a lot of bananas with peanut butter as a dessert item for him and his son said he should not be eating bananas because they are high in sugar.  Made recommendation for a referral to nutritionist/dietician. Pt said he would like a referral if PCP thought it would be beneficial. Advised a msg would be sent to PCP for advisement and that the office would f/u with him. Pt was appreciative and verbalized understanding.  Increase dose to today to take 1 1/2 tablets and then continue 1/2 tablet daily except 1 tablet on Mondays, Wednesdays and Fridays.  Re-check in 4 weeks.

## 2021-02-14 ENCOUNTER — Other Ambulatory Visit (HOSPITAL_COMMUNITY): Payer: Self-pay

## 2021-02-14 DIAGNOSIS — E785 Hyperlipidemia, unspecified: Secondary | ICD-10-CM

## 2021-02-14 MED ORDER — POTASSIUM CHLORIDE CRYS ER 20 MEQ PO TBCR: EXTENDED_RELEASE_TABLET | ORAL | 3 refills | Status: DC

## 2021-02-14 MED ORDER — SIMVASTATIN 40 MG PO TABS
20.0000 mg | ORAL_TABLET | Freq: Every day | ORAL | 3 refills | Status: DC
Start: 2021-02-14 — End: 2022-03-28

## 2021-02-14 NOTE — Addendum Note (Signed)
Addended byMaple Mirza on: 02/14/2021 12:02 PM   Modules accepted: Orders

## 2021-02-15 NOTE — Telephone Encounter (Signed)
Advised pt of referral and to f/u with office if he has not been contacted by 2 wks.  Pt verbalized understanding

## 2021-02-15 NOTE — Telephone Encounter (Signed)
LVM

## 2021-02-19 ENCOUNTER — Telehealth: Payer: Self-pay | Admitting: Family Medicine

## 2021-02-19 DIAGNOSIS — R110 Nausea: Secondary | ICD-10-CM | POA: Diagnosis not present

## 2021-02-19 DIAGNOSIS — N210 Calculus in bladder: Secondary | ICD-10-CM | POA: Diagnosis not present

## 2021-02-19 DIAGNOSIS — R3129 Other microscopic hematuria: Secondary | ICD-10-CM | POA: Diagnosis not present

## 2021-02-19 DIAGNOSIS — R8271 Bacteriuria: Secondary | ICD-10-CM | POA: Diagnosis not present

## 2021-02-19 DIAGNOSIS — N200 Calculus of kidney: Secondary | ICD-10-CM | POA: Diagnosis not present

## 2021-02-19 DIAGNOSIS — R1084 Generalized abdominal pain: Secondary | ICD-10-CM | POA: Diagnosis not present

## 2021-02-19 DIAGNOSIS — R310 Gross hematuria: Secondary | ICD-10-CM | POA: Diagnosis not present

## 2021-02-19 NOTE — Telephone Encounter (Signed)
Pt states he passed a kidney stone and has some blood in the urine. He was advised by his urologist to go off the [facility] for 2 days. He would like a call back to discuss if this is something he should do.

## 2021-02-20 NOTE — Telephone Encounter (Signed)
I think 2 days off is reasonable-would only allow INR to trend down slightly-I am letting Larene [place] know for next PT/INR check.  I realize his INR was slightly low at last check but I think very short term decrease should be okay-may help stabilize bleeding

## 2021-02-20 NOTE — Telephone Encounter (Signed)
Pt reports he did not take warfarin last night and has not had bleeding today. Urology prescribed keflex for 7 days. Advised ok to hold coumadin another day (today) and if no further bleeding resume normal dosing tomorrow. Advised if bleeding continues to contact coumadin clinic. Advised he may need to come in sooner for INR check but this nurse will contact him on 1/13 to check on his hematuria. Advised of ER precautions. Pt verbalized understanding and said he would come in sooner to check INR if necessary.

## 2021-02-20 NOTE — Telephone Encounter (Signed)
OV needed with you to discuss?

## 2021-02-20 NOTE — Telephone Encounter (Signed)
Called and spoke with pt and below message given. 

## 2021-02-22 NOTE — Telephone Encounter (Signed)
Pt denies any further hematuria but he is taking Keflex which will interact with warfarin. Pt has been scheduled for INR check on 1/18. Advised pt if any further hematuria to go to the ER. Pt verbalized understanding.  Placed pt on schedule for 1/18

## 2021-02-27 ENCOUNTER — Ambulatory Visit (INDEPENDENT_AMBULATORY_CARE_PROVIDER_SITE_OTHER): Payer: Medicare Other

## 2021-02-27 ENCOUNTER — Other Ambulatory Visit: Payer: Self-pay

## 2021-02-27 DIAGNOSIS — Z7901 Long term (current) use of anticoagulants: Secondary | ICD-10-CM

## 2021-02-27 LAB — POCT INR: INR: 1.7 — AB (ref 2.0–3.0)

## 2021-02-27 NOTE — Patient Instructions (Addendum)
Pre visit review using our clinic review tool, if applicable. No additional management support is needed unless otherwise documented below in the visit note.  Increase dose to today to take 1 1/2 tablets and then continue 1 tablet daily except 1/2 tablet on Mondays, Wednesdays and Fridays.  Re-check in 3 weeks.

## 2021-02-27 NOTE — Progress Notes (Signed)
Increase dose to today to take 1 1/2 tablets and then continue 1 tablet daily except 1/2 tablet on Mondays, Wednesdays and Fridays.  Re-check in 3 weeks.

## 2021-03-11 ENCOUNTER — Ambulatory Visit (INDEPENDENT_AMBULATORY_CARE_PROVIDER_SITE_OTHER): Payer: Medicare Other | Admitting: Family Medicine

## 2021-03-11 ENCOUNTER — Encounter: Payer: Self-pay | Admitting: Family Medicine

## 2021-03-11 ENCOUNTER — Other Ambulatory Visit: Payer: Self-pay

## 2021-03-11 VITALS — BP 122/70 | HR 50 | Temp 97.8°F | Ht 71.0 in | Wt 233.4 lb

## 2021-03-11 DIAGNOSIS — Z79899 Other long term (current) drug therapy: Secondary | ICD-10-CM

## 2021-03-11 DIAGNOSIS — E785 Hyperlipidemia, unspecified: Secondary | ICD-10-CM

## 2021-03-11 DIAGNOSIS — I10 Essential (primary) hypertension: Secondary | ICD-10-CM | POA: Diagnosis not present

## 2021-03-11 DIAGNOSIS — E1169 Type 2 diabetes mellitus with other specified complication: Secondary | ICD-10-CM

## 2021-03-11 DIAGNOSIS — I2510 Atherosclerotic heart disease of native coronary artery without angina pectoris: Secondary | ICD-10-CM

## 2021-03-11 DIAGNOSIS — M791 Myalgia, unspecified site: Secondary | ICD-10-CM | POA: Diagnosis not present

## 2021-03-11 DIAGNOSIS — E1151 Type 2 diabetes mellitus with diabetic peripheral angiopathy without gangrene: Secondary | ICD-10-CM | POA: Diagnosis not present

## 2021-03-11 DIAGNOSIS — I5022 Chronic systolic (congestive) heart failure: Secondary | ICD-10-CM

## 2021-03-11 DIAGNOSIS — I513 Intracardiac thrombosis, not elsewhere classified: Secondary | ICD-10-CM

## 2021-03-11 DIAGNOSIS — T466X5A Adverse effect of antihyperlipidemic and antiarteriosclerotic drugs, initial encounter: Secondary | ICD-10-CM

## 2021-03-11 LAB — CBC WITH DIFFERENTIAL/PLATELET
Basophils Absolute: 0.1 10*3/uL (ref 0.0–0.1)
Basophils Relative: 0.9 % (ref 0.0–3.0)
Eosinophils Absolute: 0.2 10*3/uL (ref 0.0–0.7)
Eosinophils Relative: 2.4 % (ref 0.0–5.0)
HCT: 44.8 % (ref 39.0–52.0)
Hemoglobin: 14.9 g/dL (ref 13.0–17.0)
Lymphocytes Relative: 33.2 % (ref 12.0–46.0)
Lymphs Abs: 2.7 10*3/uL (ref 0.7–4.0)
MCHC: 33.4 g/dL (ref 30.0–36.0)
MCV: 91.9 fl (ref 78.0–100.0)
Monocytes Absolute: 0.7 10*3/uL (ref 0.1–1.0)
Monocytes Relative: 8.6 % (ref 3.0–12.0)
Neutro Abs: 4.5 10*3/uL (ref 1.4–7.7)
Neutrophils Relative %: 54.9 % (ref 43.0–77.0)
Platelets: 157 10*3/uL (ref 150.0–400.0)
RBC: 4.87 Mil/uL (ref 4.22–5.81)
RDW: 13.4 % (ref 11.5–15.5)
WBC: 8.2 10*3/uL (ref 4.0–10.5)

## 2021-03-11 LAB — COMPREHENSIVE METABOLIC PANEL
ALT: 20 U/L (ref 0–53)
AST: 19 U/L (ref 0–37)
Albumin: 4.2 g/dL (ref 3.5–5.2)
Alkaline Phosphatase: 56 U/L (ref 39–117)
BUN: 26 mg/dL — ABNORMAL HIGH (ref 6–23)
CO2: 23 mEq/L (ref 19–32)
Calcium: 9.2 mg/dL (ref 8.4–10.5)
Chloride: 107 mEq/L (ref 96–112)
Creatinine, Ser: 1.26 mg/dL (ref 0.40–1.50)
GFR: 50.72 mL/min — ABNORMAL LOW (ref >60.00)
Glucose, Bld: 143 mg/dL — ABNORMAL HIGH (ref 70–99)
Potassium: 4.2 mEq/L (ref 3.5–5.1)
Sodium: 140 mEq/L (ref 135–145)
Total Bilirubin: 0.6 mg/dL (ref 0.2–1.2)
Total Protein: 6.8 g/dL (ref 6.0–8.3)

## 2021-03-11 LAB — HEMOGLOBIN A1C: Hgb A1c MFr Bld: 6.7 % — ABNORMAL HIGH (ref 4.6–6.5)

## 2021-03-11 LAB — VITAMIN B12: Vitamin B-12: 282 pg/mL (ref 211–911)

## 2021-03-11 LAB — LDL CHOLESTEROL, DIRECT: Direct LDL: 79 mg/dL

## 2021-03-11 NOTE — Patient Instructions (Addendum)
Please stop by lab before you go If you have mychart- we will send your results within 3 business days of [place] receiving them.  If you do not have mychart- we will call you about results within 5 business days of [place] receiving them.  *please also note that you will see labs on mychart as soon as they post. I will later go in and write notes on them- will say "notes from Dr. Yong Channel"   I am so proud of you for watching your dietary intake and losing a few lbs! Please keep up the great work!   Recommended follow up: Return in about 6 months (around 09/08/2021) for physical or sooner if needed. As long as a1c is under

## 2021-03-11 NOTE — Progress Notes (Signed)
Phone [phone] In person visit   Subjective:   Joseph Malone is a 88 y.o. year old very pleasant male patient who presents for/with See problem oriented charting Chief Complaint  Patient presents with   Follow-up   Hypertension   Diabetes   bites on legs    Pt states he has moved into a new apartment and states he has noticed bites on his legs but states it could be coming from a medicine he is on along with itching on his neck.   This visit occurred during the SARS-CoV-2 public health emergency.  Safety protocols were in place, including screening questions prior to the visit, additional usage of staff PPE, and extensive cleaning of exam room while observing appropriate contact time as indicated for disinfecting solutions.   Past Medical History-  Patient Active Problem List   Diagnosis Date Noted   Chronic systolic heart failure ([place]) 12/09/2010    Priority: High   Cardiomyopathy, ischemic 10/02/2009    Priority: High   Automatic implantable cardioverter-defibrillator in situ 01/16/2009    Priority: High   DM (diabetes mellitus) type II, controlled, with peripheral vascular disorder ([place]) 09/14/2008    Priority: High   CAD in native artery 09/25/2006    Priority: High   Atherosclerosis of aorta ([place]) 05/18/2020    Priority: Medium    Nephrolithiasis 03/24/2016    Priority: Medium    Hyperlipidemia associated with type 2 diabetes mellitus ([place]) 04/22/2007    Priority: Medium    Hypertension associated with diabetes ([place]) 09/25/2006    Priority: Medium    Long term (current) use of anticoagulants 07/08/2017    Priority: Low   Erectile dysfunction 01/19/2014    Priority: Low   Basal cell cancer 09/29/2012    Priority: Low   Solitary pulmonary nodule 07/12/2012    Priority: Low   BPH without obstruction/lower urinary tract symptoms 10/22/2007    Priority: Low   Osteoarthritis 09/25/2006    Priority: Low   Hematuria 09/25/2006    Priority: Low   PVC's  (premature ventricular contractions) 01/24/2020   Melena     Medications- reviewed and updated Current Outpatient Medications  Medication Sig Dispense Refill   acetaminophen (TYLENOL) 500 MG tablet Take 500 mg by mouth at bedtime.      aspirin 81 MG tablet Take 1 tablet (81 mg total) by mouth every evening.     carvedilol (COREG) 6.25 MG tablet Take 1 tablet (6.25 mg total) by mouth 2 (two) times daily with a meal. 180 tablet 3   Cholecalciferol (VITAMIN D3) 2000 UNITS TABS Take 2,000 Units by mouth daily.     folic acid (FOLVITE) 621 MCG tablet Take 400 mcg by mouth daily.     furosemide (LASIX) 40 MG tablet Take 40 mg by mouth daily as needed.     Glucosamine HCl 1500 MG TABS Take 1,500 mg by mouth every evening.      Melatonin 5 MG CHEW Chew by mouth.     metFORMIN (GLUCOPHAGE XR) 500 MG 24 hr tablet Take 1 tablet (500 mg total) by mouth daily with breakfast. 30 tablet 5   Multiple Vitamin (MULTIVITAMIN WITH MINERALS) TABS Take 1 tablet by mouth daily.      mupirocin ointment (BACTROBAN) 2 % Apply topically 2 (two) times daily as needed.     potassium chloride SA (KLOR-CON M) 20 MEQ tablet TAKE 1 TABLET BY MOUTH DAILY, GENERIC EQUIVALENT FOR KLOR-CON. 90 tablet 3   simvastatin (ZOCOR) 40 MG  tablet Take 0.5 tablets (20 mg total) by mouth at bedtime. 45 tablet 3   spironolactone (ALDACTONE) 25 MG tablet Take 0.5 tablets (12.5 mg total) by mouth daily as needed. 30 tablet 1   tamsulosin (FLOMAX) 0.4 MG CAPS capsule Take 0.4 mg by mouth at bedtime.     tiZANidine (ZANAFLEX) 2 MG tablet Take 1 tablet (2 mg total) by mouth daily as needed for muscle spasms (do not drive for 8 hours after use). 90 tablet 0   triamcinolone cream (KENALOG) 0.1 % SMARTSIG:1 Application Topical 2-3 Times Daily     warfarin (COUMADIN) 5 MG tablet TAKE 1/2 TABLET DAILY BY MOUTH EXCEPT TAKE 1 TABLET ON MONDAYS, WEDNESDAYS AND FRIDAYS OR AS DIRECTED BY ANTICOAGULATION CLINIC 100 tablet 1   losartan (COZAAR) 25 MG tablet  Take 1 tablet (25 mg total) by mouth daily. 90 tablet 2   No current facility-administered medications for this visit.     Objective:  BP 122/70    Pulse (!) 50    Temp 97.8 F (36.6 C)    Ht 5\' 11"  (1.803 m)    Wt 233 lb 6.4 oz (105.9 kg)    SpO2 95%    BMI 32.55 kg/m  Gen: NAD, resting comfortably CV: RRR no murmurs rubs or gallops Lungs: CTAB no crackles, wheeze, rhonchi Ext: no edema Skin: warm, dry, a few papules on right lower leg erythematous- a few macules that do not blanch (petechiae?)     Assessment and Plan   #social update- sold his home and moved into place on [address]- was going well but something has been biting him since then- was ok for a week and then restarted. Had some openings under shower- foam the holes. Then noted some roaches- sprayed again and exterminator said carpet bugs.  -talks to wife some still -multiple red areas on right leg- he will have extreminator come back out- some are not blanching- I will check CBC  #Heart failure with reduced ejection fraction/ ischemic cardiomypopathy- Dr. Haroldine Laws #hypertension S: Medication:Carvedilol 6.25 mg twice daily (will monitor HR with [place] and cardiology- 50 on lower end of what id like), Lasix 40 mg as needed (takes potassium with Lasix)- maybe once a month, losartan 25 mg daily, spironolactone 12.5 mg as needed- once a month  Edema: no increase Weight gain:weight actually down some but went on low carb diet Shortness of breath:  no worsening- stable Orthopnea/PND: none  A/P: Heart failure appears well controlled- continue current meds.  Blood pressure also well controlled-continue current medication  #CAD with defibrillator in place/hyperlipidemia #statin myalgia on higher doser #Apical thrombus-on Coumadin through cardiology clinic S: Medication:Aspirin 81 mg daily, simvastatin 20 mg at bedtime, coumaind - no chest pain worse than usual- only if peak exercise and no worsening. Stable shortness of breath.   Lab Results  Component Value Date   CHOL 148 09/06/2020   HDL 42.80 09/06/2020   LDLCALC 80 09/06/2020   LDLDIRECT 77.0 11/17/2017   TRIG 123.0 09/06/2020   CHOLHDL 3 09/06/2020   A/P: CAD with no worsening angina- continue current meds and cardiology follow up and current meds - statin myalgia on current dose- hoping for improved cholesterol with ewight loss - aprical thrombus suspect stable or improved - continue coumadin    # Diabetes S: Medication: metformin 500mg  XR- some GI upset but mild- loose stool -sugars can get over 200 post meal. Average of readings 160 Lab Results  Component Value Date   HGBA1C 7.7 (H) 09/06/2020  HGBA1C 6.5 (A) 02/28/2020   HGBA1C 7.2 (H) 08/26/2019   A/P: hopefully stable- update a1c today. Continue current meds for now. Goal at least under 7.5 - check b12  #Melena history-history of gastric erosions later required cauterization-on omeprazole for 3 months in 2020 - was now off all rx for GERD  #hematuria-  has seen urology kidney stones- some blood 2023- cystoscopy planned- he asks me if can cancel since he passed the stone- I asked him to directly ask urology   Recommended follow up: Return in about 6 months (around 09/08/2021) for physical or sooner if needed. Future Appointments  Date Time Provider [place]  03/20/2021 10:30 AM [facility]  04/16/2021  3:00 PM [facility] CCM PHARMACIST [facility]  05/03/2021  3:35 PM [facility] DEVICE REMOTES [facility]  08/02/2021  3:35 PM [facility] DEVICE REMOTES [facility]  09/09/2021 10:40 AM Marin Olp, MD [facility]  09/13/2021  1:00 PM [facility] HEALTH COACH [facility]  11/01/2021  3:35 PM [facility] DEVICE REMOTES [facility]  01/31/2022  3:35 PM [facility] DEVICE REMOTES [facility]  05/02/2022  3:35 PM [facility] DEVICE REMOTES [facility]    Lab/Order associations:   ICD-10-CM   1.  Hyperlipidemia associated with type 2 diabetes mellitus (HCC)  E11.69 LDL cholesterol, direct   E78.5     2. Primary hypertension  I10     3. Myalgia due to statin  M79.10    T46.6X5A     4. CAD in native artery  I25.10     5. Apical mural thrombus  I51.3     6. DM (diabetes mellitus) type II, controlled, with peripheral vascular disorder (HCC)  E11.51 CBC with Differential/Platelet    Comprehensive metabolic panel    HgB R0Q    CANCELED: Lipid panel    7. Chronic systolic heart failure (HCC)  I50.22     8. High risk medication use  Z79.899 Vitamin B12     No orders of the defined types were placed in this encounter.  I,Jada Bradford,acting as a scribe for Garret Reddish, MD.,have documented all relevant documentation on the behalf of Garret Reddish, MD,as directed by  Garret Reddish, MD while in the presence of Garret Reddish, MD.  I, Garret Reddish, MD, have reviewed all documentation for this visit. The documentation on 03/11/21 for the exam, diagnosis, procedures, and orders are all accurate and complete.  Return precautions advised.  Garret Reddish, MD

## 2021-03-13 ENCOUNTER — Ambulatory Visit: Payer: Medicare Other

## 2021-03-20 ENCOUNTER — Ambulatory Visit (INDEPENDENT_AMBULATORY_CARE_PROVIDER_SITE_OTHER): Payer: Medicare Other

## 2021-03-20 ENCOUNTER — Other Ambulatory Visit: Payer: Self-pay

## 2021-03-20 DIAGNOSIS — Z7901 Long term (current) use of anticoagulants: Secondary | ICD-10-CM | POA: Diagnosis not present

## 2021-03-20 LAB — POCT INR: INR: 3 (ref 2.0–3.0)

## 2021-03-20 NOTE — Patient Instructions (Addendum)
Pre visit review using our clinic review tool, if applicable. No additional management support is needed unless otherwise documented below in the visit note.  Continue 1 tablet daily except 1/2 tablet on Mondays, Wednesdays and Fridays.  Re-check in 5 weeks per pt request.

## 2021-03-20 NOTE — Progress Notes (Signed)
I have reviewed and agree with note, evaluation, plan.   Contina Strain, MD  

## 2021-03-20 NOTE — Progress Notes (Addendum)
Continue 1 tablet daily except 1/2 tablet on Mondays, Wednesdays and Fridays.  Re-check in 5 weeks per pt request

## 2021-03-22 ENCOUNTER — Telehealth: Payer: Self-pay | Admitting: Pharmacist

## 2021-03-22 NOTE — Progress Notes (Signed)
Chronic Care Management Pharmacy Assistant   Name: Joseph Malone  MRN: [id number] DOB: 06/18/1930   Reason for Encounter: Diabetes Adherence Call    Recent office visits:  02/12/2021 [facility] (PCP) Marin Olp, MD; no medication changes indicated.  01/25/2021 Trudee Kuster, MD; light stretches, icey hot if needed, no medication changes indicated.  Recent consult visits:  None  Hospital visits:  None in previous 6 months  Medications: Outpatient Encounter Medications as of 03/22/2021  Medication Sig   acetaminophen (TYLENOL) 500 MG tablet Take 500 mg by mouth at bedtime.    aspirin 81 MG tablet Take 1 tablet (81 mg total) by mouth every evening.   carvedilol (COREG) 6.25 MG tablet Take 1 tablet (6.25 mg total) by mouth 2 (two) times daily with a meal.   Cholecalciferol (VITAMIN D3) 2000 UNITS TABS Take 2,000 Units by mouth daily.   folic acid (FOLVITE) 384 MCG tablet Take 400 mcg by mouth daily.   furosemide (LASIX) 40 MG tablet Take 40 mg by mouth daily as needed.   Glucosamine HCl 1500 MG TABS Take 1,500 mg by mouth every evening.    losartan (COZAAR) 25 MG tablet Take 1 tablet (25 mg total) by mouth daily.   Melatonin 5 MG CHEW Chew by mouth.   metFORMIN (GLUCOPHAGE XR) 500 MG 24 hr tablet Take 1 tablet (500 mg total) by mouth daily with breakfast.   Multiple Vitamin (MULTIVITAMIN WITH MINERALS) TABS Take 1 tablet by mouth daily.    mupirocin ointment (BACTROBAN) 2 % Apply topically 2 (two) times daily as needed.   potassium chloride SA (KLOR-CON M) 20 MEQ tablet TAKE 1 TABLET BY MOUTH DAILY, GENERIC EQUIVALENT FOR KLOR-CON.   simvastatin (ZOCOR) 40 MG tablet Take 0.5 tablets (20 mg total) by mouth at bedtime.   spironolactone (ALDACTONE) 25 MG tablet Take 0.5 tablets (12.5 mg total) by mouth daily as needed.   tamsulosin (FLOMAX) 0.4 MG CAPS capsule Take 0.4 mg by mouth at bedtime.   tiZANidine (ZANAFLEX) 2 MG tablet Take 1 tablet (2 mg total) by mouth daily as  needed for muscle spasms (do not drive for 8 hours after use).   triamcinolone cream (KENALOG) 0.1 % SMARTSIG:1 Application Topical 2-3 Times Daily   warfarin (COUMADIN) 5 MG tablet TAKE 1/2 TABLET DAILY BY MOUTH EXCEPT TAKE 1 TABLET ON MONDAYS, WEDNESDAYS AND FRIDAYS OR AS DIRECTED BY ANTICOAGULATION CLINIC   No facility-administered encounter medications on file as of 03/22/2021.   Recent Relevant Labs: Lab Results  Component Value Date/Time   HGBA1C 6.7 (H) 03/11/2021 11:58 AM   HGBA1C 7.7 (H) 09/06/2020 11:34 AM   MICROALBUR 1.3 05/17/2020 10:11 AM   MICROALBUR 1.1 01/11/2016 08:36 AM    Kidney Function Lab Results  Component Value Date/Time   CREATININE 1.26 03/11/2021 11:58 AM   CREATININE 1.18 09/06/2020 11:34 AM   GFR 50.72 (L) 03/11/2021 11:58 AM   GFRNONAA 40 (L) 10/01/2018 02:26 PM   GFRAA 46 (L) 10/01/2018 02:26 PM    Current antihyperglycemic regimen:  Metformin 500 mg daily  What recent interventions/DTPs have been made to improve glycemic control:  No recent interventions or DTPs.  Have there been any recent hospitalizations or ED visits since last visit with CPP? No  Patient denies hypoglycemic symptoms.  Patient denies hyperglycemic symptoms.  How often are you checking your blood sugar? once daily  What are your blood sugars ranging?  Fasting: 135, 125  During the week, how often does your  blood glucose drop below 70? Never  Are you checking your feet daily/regularly? Patient states he checks his feet regularly.  Adherence Review: Is the patient currently on a STATIN medication? Yes Is the patient currently on ACE/ARB medication? Yes Does the patient have >5 day gap between last estimated fill dates? No   Care Gaps: Medicare Annual Wellness: Completed 09/03/2020 Ophthalmology Exam: Completed 08/30/2021 Foot Exam: Next due on 09/06/2021 Hemoglobin A1C: 6.7% on 03/11/2021 Colonoscopy: Aged out  Future Appointments  Date Time Provider [place]  04/16/2021  3:00 PM [facility] C[facility] PHARMACIST [facility]  04/24/2021 10:30 AM [facility]  05/03/2021  3:35 PM [facility] DEVICE REMOTES [facility]  07/05/2021  2:30 PM [name], MD [facility]  08/02/2021  3:35 PM [facility] DEVICE REMOTES [facility]  09/09/2021 10:40 AM Marin Olp, MD [facility]  09/13/2021  1:00 PM [facility] HEALTH COACH [facility]  11/01/2021  3:35 PM [facility] DEVICE REMOTES [facility]  01/31/2022  3:35 PM [facility] DEVICE REMOTES [facility]  05/02/2022  3:35 PM [facility] DEVICE REMOTES [facility]    Star Rating Drugs: Losartan 25 mg last filled 10/25/2020 90 DS Simvastatin 40 mg last filled 02/14/2021 90 DS Metformin ER 500 mg last filled 01/15/2021 90 DS  April D Calhoun, [place] Pharmacist Assistant [phone]

## 2021-04-02 ENCOUNTER — Telehealth: Payer: Self-pay | Admitting: Family Medicine

## 2021-04-02 MED ORDER — ONETOUCH ULTRA 2 W/DEVICE KIT: PACK | 3 refills | Status: DC

## 2021-04-02 MED ORDER — ONETOUCH ULTRASOFT LANCETS MISC: 12 refills | Status: DC

## 2021-04-02 MED ORDER — GLUCOSE BLOOD VI STRP: ORAL_STRIP | 12 refills | Status: DC

## 2021-04-02 NOTE — Telephone Encounter (Signed)
Pt needing a rx for a diabetic testing kit. Pt wants a one touch. Please call pt for clarification at [phone].

## 2021-04-02 NOTE — Telephone Encounter (Signed)
Rx sent to pharmacy   

## 2021-04-03 DIAGNOSIS — R310 Gross hematuria: Secondary | ICD-10-CM | POA: Diagnosis not present

## 2021-04-08 ENCOUNTER — Telehealth: Payer: Self-pay | Admitting: Family Medicine

## 2021-04-08 MED ORDER — ONETOUCH ULTRA 2 W/DEVICE KIT: PACK | 3 refills | Status: DC

## 2021-04-08 NOTE — Telephone Encounter (Signed)
.. °  Encourage patient to contact the pharmacy for refills or they can request refills through [place]:  Please schedule appointment if longer than 1 year 03/11/2021 NEXT APPOINTMENT DATE: 7/37/2023  MEDICATION: One touch Glucose meter   Patient meter is cracked. Is the patient out of medication?   PHARMACY: [facility] at [address].   Let patient know to contact pharmacy at the end of the day to make sure medication is ready.  Please notify patient to allow 48-72 hours to process

## 2021-04-08 NOTE — Telephone Encounter (Signed)
Rx sent to pharmacy   

## 2021-04-12 ENCOUNTER — Other Ambulatory Visit: Payer: Self-pay | Admitting: Family Medicine

## 2021-04-15 NOTE — Progress Notes (Signed)
° °Chronic Care Management °Pharmacy Note ° °04/16/2021 °Name:  Joseph Malone MRN:  [id number] DOB:  10/05/1930 ° °Summary: °A1c improved!  Still having some GI with metformin.  Getting new meter from pharmacy due to malfunction with old one.  Doing well limiting carbs. ° ° °Subjective: °Joseph Malone is an 88 y.o. year old male who is a primary patient of Hunter, Stephen O, MD.  The CCM team was consulted for assistance with disease management and care coordination needs.   ° °Engaged with patient face to face for follow up visit in response to provider referral for pharmacy case management and/or care coordination services.  ° °Consent to Services:  °The patient was given the following information about Chronic Care Management services today, agreed to services, and gave verbal consent: 1. CCM service includes personalized support from designated clinical staff supervised by the primary care provider, including individualized plan of care and coordination with other care providers 2. 24/7 contact phone numbers for assistance for urgent and routine care needs. 3. Service will only be billed when office clinical staff spend 20 minutes or more in a month to coordinate care. 4. Only one practitioner may furnish and bill the service in a calendar month. 5.The patient may stop CCM services at any time (effective at the end of the month) by phone call to the office staff. 6. The patient will be responsible for cost sharing (co-pay) of up to 20% of the service fee (after annual deductible is met). Patient agreed to services and consent obtained. ° °Patient Care Team: °Hunter, Stephen O, MD as PCP - General (Family Medicine) °Taylor, Gregg W, MD as PCP - Electrophysiology (Cardiology) °Bensimhon, Daniel R, MD as PCP - Advanced Heart Failure (Cardiology) °Eskridge, Matthew, MD as Consulting Physician (Urology) °Hall, John, MD (Dermatology) °Davis, Christian L, RPH as Pharmacist (Pharmacist) ° °Recent office visits:   °02/12/2021 OV (PCP) Hunter, Stephen O, MD; no medication changes indicated. °  °01/25/2021 OV Kulik, Ann Marie, MD; light stretches, icey hot if needed, no medication changes indicated. °  °Recent consult visits:  °None °  °Hospital visits:  °None in previous 6 months °  ° ° °Objective: ° °Lab Results  °Component Value Date  ° CREATININE 1.26 03/11/2021  ° BUN 26 (H) 03/11/2021  ° GFR 50.72 (L) 03/11/2021  ° GFRNONAA 40 (L) 10/01/2018  ° GFRAA 46 (L) 10/01/2018  ° NA 140 03/11/2021  ° K 4.2 03/11/2021  ° CALCIUM 9.2 03/11/2021  ° CO2 23 03/11/2021  ° GLUCOSE 143 (H) 03/11/2021  ° ° °Lab Results  °Component Value Date/Time  ° HGBA1C 6.7 (H) 03/11/2021 11:58 AM  ° HGBA1C 7.7 (H) 09/06/2020 11:34 AM  ° GFR 50.72 (L) 03/11/2021 11:58 AM  ° GFR 55.07 (L) 09/06/2020 11:34 AM  ° MICROALBUR 1.3 05/17/2020 10:11 AM  ° MICROALBUR 1.1 01/11/2016 08:36 AM  °  °Last diabetic Eye exam:  °Lab Results  °Component Value Date/Time  ° HMDIABEYEEXA No Retinopathy 08/10/2020 12:00 AM  °  °Last diabetic Foot exam: No results found for: HMDIABFOOTEX  ° °Lab Results  °Component Value Date  ° CHOL 148 09/06/2020  ° HDL 42.80 09/06/2020  ° LDLCALC 80 09/06/2020  ° LDLDIRECT 79.0 03/11/2021  ° TRIG 123.0 09/06/2020  ° CHOLHDL 3 09/06/2020  ° ° °Hepatic Function Latest Ref Rng & Units 03/11/2021 09/06/2020 05/17/2020  °Total Protein 6.0 - 8.3 g/dL 6.8 6.9 6.7  °Albumin 3.5 - 5.2 g/dL 4.2 3.9 4.0  °AST 0 - 37   U/L 19 20 19  °ALT 0 - 53 U/L 20 20 20  °Alk Phosphatase 39 - 117 U/L 56 54 61  °Total Bilirubin 0.2 - 1.2 mg/dL 0.6 0.4 0.4  °Bilirubin, Direct 0.0 - 0.3 mg/dL - - -  ° ° °Lab Results  °Component Value Date/Time  ° TSH 0.76 05/17/2020 10:11 AM  ° TSH 0.47 02/28/2020 01:54 PM  ° FREET4 0.82 10/22/2010 09:34 AM  ° ° °CBC Latest Ref Rng & Units 03/11/2021 09/06/2020 05/17/2020  °WBC 4.0 - 10.5 K/uL 8.2 6.7 6.9  °Hemoglobin 13.0 - 17.0 g/dL 14.9 13.9 14.3  °Hematocrit 39.0 - 52.0 % 44.8 41.5 42.6  °Platelets 150.0 - 400.0 K/uL 157.0 167.0 201.0   ° ° °No results found for: VD25OH ° °Clinical ASCVD: Yes  °The ASCVD Risk score (Arnett DK, et al., 2019) failed to calculate for the following reasons: °  The 2019 ASCVD risk score is only valid for ages 40 to 79   ° °Depression screen PHQ 2/9 11/23/2020 09/03/2020 06/26/2020  °Decreased Interest 0 0 0  °Down, Depressed, Hopeless 0 0 0  °PHQ - 2 Score 0 0 0  °Altered sleeping - - 0  °Tired, decreased energy - - 0  °Change in appetite - - 0  °Feeling bad or failure about yourself  - - 0  °Trouble concentrating - - 0  °Moving slowly or fidgety/restless - - 0  °Suicidal thoughts - - 0  °PHQ-9 Score - - 0  °Difficult doing work/chores - - Not difficult at all  °Some recent data might be hidden  ° ° °Social History  ° °Tobacco Use  °Smoking Status Never  ° Passive exposure: Yes  °Smokeless Tobacco Never  ° °BP Readings from Last 3 Encounters:  °03/11/21 122/70  °01/25/21 129/71  °11/23/20 122/76  ° °Pulse Readings from Last 3 Encounters:  °03/11/21 (!) 50  °01/25/21 69  °11/23/20 80  ° °Wt Readings from Last 3 Encounters:  °03/11/21 233 lb 6.4 oz (105.9 kg)  °01/25/21 237 lb 3.2 oz (107.6 kg)  °11/23/20 244 lb (110.7 kg)  ° °BMI Readings from Last 3 Encounters:  °03/11/21 32.55 kg/m²  °01/25/21 33.08 kg/m²  °11/23/20 34.03 kg/m²  ° ° °Assessment/Interventions: Review of patient past medical history, allergies, medications, health status, including review of consultants reports, laboratory and other test data, was performed as part of comprehensive evaluation and provision of chronic care management services.  ° °SDOH:  (Social Determinants of Health) assessments and interventions performed: Yes ° °Financial Resource Strain: Low Risk   ° Difficulty of Paying Living Expenses: Not hard at all  ° ° °SDOH Screenings  ° °Alcohol Screen: Not on file  °Depression (PHQ2-9): Low Risk   ° PHQ-2 Score: 0  °Financial Resource Strain: Low Risk   ° Difficulty of Paying Living Expenses: Not hard at all  °Food Insecurity: No Food  Insecurity  ° Worried About Running Out of Food in the Last Year: Never true  ° Ran Out of Food in the Last Year: Never true  °Housing: Low Risk   ° Last Housing Risk Score: 0  °Physical Activity: Sufficiently Active  ° Days of Exercise per Week: 3 days  ° Minutes of Exercise per Session: 50 min  °Social Connections: Socially Integrated  ° Frequency of Communication with Friends and Family: More than three times a week  ° Frequency of Social Gatherings with Friends and Family: More than three times a week  ° Attends Religious Services: More than 4   times per year  ° Active Member of Clubs or Organizations: Yes  ° Attends Club or Organization Meetings: 1 to 4 times per year  ° Marital Status: Married  °Stress: No Stress Concern Present  ° Feeling of Stress : Only a little  °Tobacco Use: Medium Risk  ° Smoking Tobacco Use: Never  ° Smokeless Tobacco Use: Never  ° Passive Exposure: Yes  °Transportation Needs: No Transportation Needs  ° Lack of Transportation (Medical): No  ° Lack of Transportation (Non-Medical): No  ° ° °CCM Care Plan ° °Allergies  °Allergen Reactions  ° Penicillins Rash and Other (See Comments)  °  Because of a history of documented adverse serious drug reaction;Medi Alert bracelet  is recommended °Has patient had a PCN reaction causing immediate rash, facial/tongue/throat swelling, SOB or lightheadedness with hypotension: No °Has patient had a PCN reaction causing severe rash involving mucus membranes or skin necrosis: No °Has patient had a PCN reaction that required hospitalization: No °Has patient had a PCN reaction occurring within the last 10 years: No °If all of the above answers are "NO", t  ° ° °Medications Reviewed Today   ° ° Reviewed by Davis, Christian L, RPH (Pharmacist) on 04/16/21 at 1527  Med List Status: <None>  ° °Medication Order Taking? Sig Documenting Provider Last Dose Status Informant  °acetaminophen (TYLENOL) 500 MG tablet [id number] Yes Take 500 mg by mouth at bedtime.  [provider] Taking Active Self  °aspirin 81 MG tablet [id number] Yes Take 1 tablet (81 mg total) by mouth every evening. Bensimhon, Daniel R, MD Taking Active Self  °Blood Glucose Monitoring Suppl (ONE TOUCH ULTRA 2) w/Device KIT [id number] Yes Use to test blood sugars daily. Dx: E11.9 Hunter, Stephen O, MD Taking Active   °carvedilol (COREG) 6.25 MG tablet [id number] Yes Take 1 tablet (6.25 mg total) by mouth 2 (two) times daily with a meal. Hunter, Stephen O, MD Taking Active   °Cholecalciferol (VITAMIN D3) 2000 UNITS TABS [id number] Yes Take 2,000 Units by mouth daily. [provider] Taking Active Self  °folic acid (FOLVITE) 400 MCG tablet [id number] Yes Take 400 mcg by mouth daily. [provider] Taking Active Self  °furosemide (LASIX) 40 MG tablet [id number] Yes Take 40 mg by mouth daily as needed. [provider] Taking Active   °         °Med Note (BOYD, APRIL H   Tue Jan 24, 2020  3:56 PM)    °Glucosamine HCl 1500 MG TABS [id number] Yes Take 1,500 mg by mouth every evening.  [provider] Taking Active Self  °glucose blood test strip [id number] Yes Use to test blood sugars daily. Dx: E11.9 Hunter, Stephen O, MD Taking Active   °Lancets (ONETOUCH ULTRASOFT) lancets [id number] Yes Use to test blood sugars daily. Dx: E11.9 Hunter, Stephen O, MD Taking Active   °losartan (COZAAR) 25 MG tablet [id number]  Take 1 tablet (25 mg total) by mouth daily. Bensimhon, Daniel R, MD  Expired 11/23/20 2359   °Melatonin 5 MG CHEW [id number] Yes Chew by mouth. [provider] Taking Active   °metFORMIN (GLUCOPHAGE-XR) 500 MG 24 hr tablet [id number] Yes TAKE 1 TABLET(500 MG) BY MOUTH DAILY WITH BREAKFAST Hunter, Stephen O, MD Taking Active   °Multiple Vitamin (MULTIVITAMIN WITH MINERALS) TABS [id number] Yes Take 1 tablet by mouth daily.  [provider] Taking Active Self  °mupirocin ointment (BACTROBAN) 2 % [id number] Yes Apply topically 2 (two) times daily as needed. [provider] Taking Active   °  potassium chloride SA (KLOR-CON M) 20 MEQ tablet [id number] Yes TAKE 1 TABLET BY MOUTH DAILY, GENERIC EQUIVALENT FOR KLOR-CON. Bensimhon, Daniel R, MD Taking Active   °simvastatin (ZOCOR) 40 MG tablet [id number] Yes Take 0.5 tablets (20 mg total) by mouth at bedtime. Bensimhon, Daniel R, MD Taking Active   °spironolactone (ALDACTONE) 25 MG tablet [id number] Yes Take 0.5 tablets (12.5 mg total) by mouth daily as needed. Hunter, Stephen O, MD Taking Active   °tamsulosin (FLOMAX) 0.4 MG CAPS capsule [id number] Yes Take 0.4 mg by mouth at bedtime. [provider] Taking Active   °tiZANidine (ZANAFLEX) 2 MG tablet [id number] Yes Take 1 tablet (2 mg total) by mouth daily as needed for muscle spasms (do not drive for 8 hours after use). Hunter, Stephen O, MD Taking Active   °triamcinolone cream (KENALOG) 0.1 % [id number] Yes SMARTSIG:1 Application Topical 2-3 Times Daily [provider] Taking Active   °warfarin (COUMADIN) 5 MG tablet [id number] Yes TAKE 1/2 TABLET DAILY BY MOUTH EXCEPT TAKE 1 TABLET ON MONDAYS, WEDNESDAYS AND FRIDAYS OR AS DIRECTED BY ANTICOAGULATION CLINIC Hunter, Stephen O, MD Taking Active   ° °  °  ° °  ° ° °Patient Active Problem List  ° Diagnosis Date Noted  ° Atherosclerosis of aorta (HCC) 05/18/2020  ° PVC's (premature ventricular contractions) 01/24/2020  ° Melena   ° Long term (current) use of anticoagulants 07/08/2017  ° Nephrolithiasis 03/24/2016  ° Erectile dysfunction 01/19/2014  ° Basal cell cancer 09/29/2012  ° Solitary pulmonary nodule 07/12/2012  ° Chronic systolic heart failure (HCC) 12/09/2010  ° Cardiomyopathy, ischemic 10/02/2009  ° Automatic implantable cardioverter-defibrillator in situ 01/16/2009  ° DM (diabetes mellitus) type II, controlled, with peripheral vascular disorder (HCC) 09/14/2008  ° BPH without obstruction/lower urinary tract symptoms 10/22/2007  ° Hyperlipidemia associated with type 2 diabetes mellitus (HCC) 04/22/2007  °  Hypertension associated with diabetes (HCC) 09/25/2006  ° CAD in native artery 09/25/2006  ° Osteoarthritis 09/25/2006  ° Hematuria 09/25/2006  ° ° °Immunization History  °Administered Date(s) Administered  ° Fluad Quad(high Dose 65+) 01/20/2020  ° Influenza, High Dose Seasonal PF 10/12/2018, 10/24/2020  ° Influenza,inj,Quad PF,6+ Mos 11/11/2012  ° Influenza-Unspecified 10/25/2013, 10/27/2014, 10/24/2015, 10/19/2017  ° PFIZER(Purple Top)SARS-COV-2 Vaccination 03/06/2019, 03/24/2019, 01/20/2020, 06/27/2020  ° PNEUMOCOCCAL CONJUGATE-20 09/06/2020  ° Pneumococcal Conjugate-13 08/21/2014  ° Pneumococcal Polysaccharide-23 10/02/2009  ° Td 09/17/2009, 10/31/2019  ° Zoster Recombinat (Shingrix) 06/27/2020, 08/27/2020  ° Zoster, Live 12/29/2012  ° ° °Conditions to be addressed/monitored:  °CAD, HF, HTN, Type II DM, HLD, Arthritis ° °Care Plan : General Pharmacy (Adult)  °Updates made by Lesean Woolverton L, RPH since 04/16/2021 12:00 AM  °  ° °Problem: CAD, HF, HTN, Type II DM, HLD, Arthritis   °Priority: High  °Onset Date: 12/27/2020  °  ° °Long-Range Goal: Patient-Specific Goal   °Start Date: 12/24/2020  °Expected End Date: 06/26/2021  °Recent Progress: On track  °Priority: High  °Note:   °Current Barriers:  °Meter malfunctioning - unreliable home monitoring ° °Pharmacist Clinical Goal(s):  °Patient will achieve improvement in A1c as evidenced by labs through collaboration with PharmD and provider.  ° °Interventions: °1:1 collaboration with Hunter, Stephen O, MD regarding development and update of comprehensive plan of care as evidenced by provider attestation and co-signature °Inter-disciplinary care team collaboration (see longitudinal plan of care) °Comprehensive medication review performed; medication list updated in electronic medical record ° °Hypertension (BP goal <130/80) °-Controlled °-Current treatment: °Losartan 12.5mg daily °Spironolactone 25mg daily °Carvedilol 6.25mg BID °-Medications previously   BID -Medications previously tried:   valsartan  -Reports hypotensive/hypertensive symptoms - dizziness or weakness, has improved some with decreased dose of losartan but not a huge difference -Educated on BP goals and benefits of medications for prevention of heart attack, stroke and kidney damage; Exercise goal of 150 minutes per week; Importance of home blood pressure monitoring; -Counseled to monitor BP at home daily to weekly, document, and provide log at future appointments -Recommended to continue current medication Try to increase physical activity as tolerated  Hyperlipidemia/CAD: (LDL goal < 70) -Not ideally controlled -Current treatment: Simvastatin 20m daily Appropriate, Effective, Safe, Accessible -Medications previously tried: none noted  -Educated on Cholesterol goals;  Benefits of statin for ASCVD risk reduction; Importance of limiting foods high in cholesterol; -Recommended to continue current medication -Most recent LDL not quite at goal, current age would continue same dose.  Work on lifestyle mods  Update 04/16/21 LDL 79 not quite at goal < 70.  He has previously not tolerated highet doses.  Due to age and inability to tolerate increased doses would leave as it.  Would rather him taki something that have side effects and stop taking. No changes at this time - continue to montior.  Diabetes (A1c goal <7%) -Controlled -Current medications: Metformin XR 5043mdaily Appropriate, Effective, Safe, Accessible -Medications previously tried: none noted  -Current home glucose readings -Denies hypoglycemic/hyperglycemic symptoms -Current meal patterns:  High carbohydrate content of meals, juices having lots of sugar -Current exercise: minimal -Educated on A1c and blood sugar goals; Carohydrate content of various foods/effect of glucose -Counseled to check feet daily and get yearly eye exams -Recommended to continue current medication Really work hard on limiting carbs in diet and beverages.  Implement  exercise where possible.  He mentions some GI effects with metformin.  Encouraged him to stick with it a few more weeks, hopefully will resolve with time.  Recheck A1c - if elevated and tolerating ok could titrate metformin.   Update 04/16/21 He is in the process of getting a new meter because of a crack in the glass.  Has improved his diet and cut out breads, rice, potatoes.  Now eating way less carbs than previous.  A1c has decreased to 6.7!  This is a big improvement - congratulations! Still having some GI upset with metformin but he believes it is improving. Monitor glucose at home with new meter.  Goal < 130 fasting. Contact me if that is not the average. No changes at this time.  Heart Failure (Goal: manage symptoms and prevent exacerbations) -Controlled -Last ejection fraction: 35%   -Current treatment: Carvedilol 6.2563mID Furosemide 34m48mily -Medications previously tried: none noted  -Educated on Benefits of medications for managing symptoms and prolonging life Importance of weighing daily; if you gain more than 3 pounds in one day or 5 pounds in one week, contact providers Importance of blood pressure control Limit added salts in diet -Counseled on diet and exercise extensively Recommended to continue current medication Monitor weight for excess fluid.  Also stay hydrated because he is taking two meds that will cause fluid loss.  Dehydration could also be a source of his weakness.   Patient Goals/Self-Care Activities Patient will:  - take medications as prescribed as evidenced by patient report and record review check blood pressure weekly, document, and provide at future appointments weigh daily, and contact provider if weight gain of 3 lbs in one day or 5 lbs in one week Work on lifestyle mods to improve glucose  Follow  care management team will reach out to the patient again over the next 120 days.  ° °  ° °  ° °  ° °Medication Assistance: None required.   Patient affirms current coverage meets needs. ° °Compliance/Adherence/Medication fill history: °Care Gaps: °None at this time ° °Star-Rating Drugs: °Simvastatin 40mg daily 10/08/20 90ds °Metformin ER 500mg 11/23/20 90ds ° °Patient's preferred pharmacy is: ° °[facility] - [place], [place] - [address] °[address] °[place] [address] °Phone: [phone] Fax: [phone] ° °[id number] - [place], [place] - [address] °[address] °[place] [address] °Phone: [phone] Fax: [phone] ° °[facility] - [place], [place] - [address] °[address] °[place] [address] °Phone: [phone] Fax: [phone] ° °[id number] - [place], [place] - [address] AT [address] °[address] ° [place] [address] °Phone: [phone] Fax: [phone] ° ° °Uses pill box? Yes °Pt endorses 100% compliance ° °We discussed: Benefits of medication synchronization, packaging and delivery as well as enhanced pharmacist oversight with [employer]. °Patient decided to: Continue current medication management strategy ° °Care Plan and Follow Up Patient Decision:  Patient agrees to Care Plan and Follow-up. ° °Plan: The care management team will reach out to the patient again over the next 120 days. ° °Christian Davis, PharmD °Clinical Pharmacist  °[facility] [facility] °[phone] ° ° ° ° ° ° °

## 2021-04-16 ENCOUNTER — Ambulatory Visit (INDEPENDENT_AMBULATORY_CARE_PROVIDER_SITE_OTHER): Payer: Medicare Other | Admitting: Pharmacist

## 2021-04-16 DIAGNOSIS — E1169 Type 2 diabetes mellitus with other specified complication: Secondary | ICD-10-CM

## 2021-04-16 DIAGNOSIS — I152 Hypertension secondary to endocrine disorders: Secondary | ICD-10-CM

## 2021-04-16 DIAGNOSIS — E785 Hyperlipidemia, unspecified: Secondary | ICD-10-CM

## 2021-04-16 DIAGNOSIS — E1159 Type 2 diabetes mellitus with other circulatory complications: Secondary | ICD-10-CM

## 2021-04-16 NOTE — Patient Instructions (Addendum)
Visit Information ? ? Goals Addressed   ? ?  ?  ?  ?  ? This Visit's Progress  ?  Set My Target A1C-Diabetes Type 2   On track  ?  Timeframe:  Long-Range Goal ?Priority:  High ?Start Date:  12/27/20                           ?Expected End Date: 06/26/21                     ? ?Follow Up Date 03/29/21  ?  ?A1c goal < 7.0 ?  ?Why is this important?   ?Your target A1C is decided together by you and your doctor.  ?It is based on several things like your age and other health issues.  ?  ?Notes:  ?  ? ?  ? ?Patient Care Plan: General Pharmacy (Adult)  ?  ? ?Problem Identified: CAD, HF, HTN, Type II DM, HLD, Arthritis   ?Priority: High  ?Onset Date: 12/27/2020  ?  ? ?Long-Range Goal: Patient-Specific Goal   ?Start Date: 12/24/2020  ?Expected End Date: 06/26/2021  ?Recent Progress: On track  ?Priority: High  ?Note:   ?Current Barriers:  ?Meter malfunctioning - unreliable home monitoring ? ?Pharmacist Clinical Goal(s):  ?Patient will achieve improvement in A1c as evidenced by labs through collaboration with PharmD and provider.  ? ?Interventions: ?1:1 collaboration with Marin Olp, MD regarding development and update of comprehensive plan of care as evidenced by provider attestation and co-signature ?Inter-disciplinary care team collaboration (see longitudinal plan of care) ?Comprehensive medication review performed; medication list updated in electronic medical record ? ?Hypertension (BP goal <130/80) ?-Controlled ?-Current treatment: ?Losartan 12.'5mg'$  daily ?Spironolactone '25mg'$  daily ?Carvedilol 6.'25mg'$  BID ?-Medications previously tried:  valsartan  ?-Reports hypotensive/hypertensive symptoms - dizziness or weakness, has improved some with decreased dose of losartan but not a huge difference ?-Educated on BP goals and benefits of medications for prevention of heart attack, stroke and kidney damage; ?Exercise goal of 150 minutes per week; ?Importance of home blood pressure monitoring; ?-Counseled to monitor BP at home  daily to weekly, document, and provide log at future appointments ?-Recommended to continue current medication ?Try to increase physical activity as tolerated ? ?Hyperlipidemia/CAD: (LDL goal < 70) ?-Not ideally controlled ?-Current treatment: ?Simvastatin '40mg'$  daily Appropriate, Effective, Safe, Accessible ?-Medications previously tried: none noted  ?-Educated on Cholesterol goals;  ?Benefits of statin for ASCVD risk reduction; ?Importance of limiting foods high in cholesterol; ?-Recommended to continue current medication ?-Most recent LDL not quite at goal, current age would continue same dose.  Work on lifestyle mods ? ?Update 04/16/21 ?LDL 79 not quite at goal < 70.  He has previously not tolerated highet doses.  Due to age and inability to tolerate increased doses would leave as it.  Would rather him taki something that have side effects and stop taking. ?No changes at this time - continue to montior. ? ?Diabetes (A1c goal <7%) ?-Controlled ?-Current medications: ?Metformin XR '500mg'$  daily Appropriate, Effective, Safe, Accessible ?-Medications previously tried: none noted  ?-Current home glucose readings ?-Denies hypoglycemic/hyperglycemic symptoms ?-Current meal patterns:  ?High carbohydrate content of meals, juices having lots of sugar ?-Current exercise: minimal ?-Educated on A1c and blood sugar goals; ?Carohydrate content of various foods/effect of glucose ?-Counseled to check feet daily and get yearly eye exams ?-Recommended to continue current medication ?Really work hard on limiting carbs in diet and beverages.  Implement exercise where  possible.  He mentions some GI effects with metformin.  Encouraged him to stick with it a few more weeks, hopefully will resolve with time.  Recheck A1c - if elevated and tolerating ok could titrate metformin.  ? ?Update 04/16/21 ?He is in the process of getting a new meter because of a crack in the glass.  Has improved his diet and cut out breads, rice, potatoes.  Now  eating way less carbs than previous.  A1c has decreased to 6.7!  This is a big improvement - congratulations! ?Still having some GI upset with metformin but he believes it is improving. ?Monitor glucose at home with new meter.  Goal < 130 fasting. ?Contact me if that is not the average. ?No changes at this time. ? ?Heart Failure (Goal: manage symptoms and prevent exacerbations) ?-Controlled ?-Last ejection fraction: 35%  ? ?-Current treatment: ?Carvedilol 6.'25mg'$  BID ?Furosemide '40mg'$  daily ?-Medications previously tried: none noted  ?-Educated on Benefits of medications for managing symptoms and prolonging life ?Importance of weighing daily; if you gain more than 3 pounds in one day or 5 pounds in one week, contact providers ?Importance of blood pressure control ?Limit added salts in diet ?-Counseled on diet and exercise extensively ?Recommended to continue current medication ?Monitor weight for excess fluid.  Also stay hydrated because he is taking two meds that will cause fluid loss.  Dehydration could also be a source of his weakness. ? ? ?Patient Goals/Self-Care Activities ?Patient will:  ?- take medications as prescribed as evidenced by patient report and record review ?check blood pressure weekly, document, and provide at future appointments ?weigh daily, and contact provider if weight gain of 3 lbs in one day or 5 lbs in one week ?Work on lifestyle mods to improve glucose ? ?Follow Up Plan: The care management team will reach out to the patient again over the next 120 days.  ? ?  ? ?  ?  ? ?The patient verbalized understanding of instructions, educational materials, and care plan provided today and declined offer to receive copy of patient instructions, educational materials, and care plan.  ?Telephone follow up appointment with pharmacy team member scheduled for: 6 months ? ?Edythe Clarity, [facility]  ?Beverly Milch, PharmD ?Clinical Pharmacist  ?Joseph Malone ?([phone] ? ?

## 2021-04-18 ENCOUNTER — Telehealth: Payer: Self-pay | Admitting: Family Medicine

## 2021-04-18 ENCOUNTER — Other Ambulatory Visit: Payer: Self-pay

## 2021-04-18 MED ORDER — ONETOUCH DELICA PLUS LANCET30G MISC: 3 refills | Status: DC

## 2021-04-18 NOTE — Telephone Encounter (Signed)
Pt stated he has received the wrong lancets. He is asking to have them reorder. He gave a product number. [id number] ?

## 2021-04-24 ENCOUNTER — Ambulatory Visit (INDEPENDENT_AMBULATORY_CARE_PROVIDER_SITE_OTHER): Payer: Medicare Other

## 2021-04-24 DIAGNOSIS — Z7901 Long term (current) use of anticoagulants: Secondary | ICD-10-CM | POA: Diagnosis not present

## 2021-04-24 DIAGNOSIS — E1151 Type 2 diabetes mellitus with diabetic peripheral angiopathy without gangrene: Secondary | ICD-10-CM

## 2021-04-24 LAB — POCT INR: INR: 2.8 (ref 2.0–3.0)

## 2021-04-24 MED ORDER — ONETOUCH DELICA LANCETS 30G MISC: 3 refills | Status: DC

## 2021-04-24 NOTE — Progress Notes (Signed)
I have reviewed and agree with note, evaluation, plan.   Toivo Bordon, MD  

## 2021-04-24 NOTE — Progress Notes (Addendum)
Continue 1 tablet daily except 1/2 tablet on Mondays, Wednesdays and Fridays.  Re-check in 6 weeks. ? ?Pt also reported he did not receive the correct lancets for his glucometer. He brought in the packaging from the lancets and they were One Touch Delica Plus lancets, 21Y.  ?Sent in script for lancets.  ?

## 2021-04-24 NOTE — Telephone Encounter (Signed)
Pt was in today for INR check and brought up he has not received the correct lancets.  ?He brought in the packaging from the lancets and they were One Touch Delica Plus lancets, 33A.  ?Sent in script for lancets.  ?

## 2021-04-24 NOTE — Patient Instructions (Addendum)
Pre visit review using our clinic review tool, if applicable. No additional management support is needed unless otherwise documented below in the visit note. ? ?Continue 1 tablet daily except 1/2 tablet on Mondays, Wednesdays and Fridays.  Re-check in 6 weeks. ?

## 2021-05-03 ENCOUNTER — Ambulatory Visit (INDEPENDENT_AMBULATORY_CARE_PROVIDER_SITE_OTHER): Payer: Medicare Other

## 2021-05-03 DIAGNOSIS — I5022 Chronic systolic (congestive) heart failure: Secondary | ICD-10-CM

## 2021-05-03 DIAGNOSIS — I255 Ischemic cardiomyopathy: Secondary | ICD-10-CM | POA: Diagnosis not present

## 2021-05-06 LAB — CUP PACEART REMOTE DEVICE CHECK
Battery Remaining Longevity: 50 mo
Battery Voltage: 2.98 V
Brady Statistic RV Percent Paced: 0.07 %
Date Time Interrogation Session: 20230324043624
HighPow Impedance: 74 Ohm
Implantable Lead Implant Date: 20150422
Implantable Lead Location: 753860
Implantable Lead Model: 6935
Implantable Pulse Generator Implant Date: 20150422
Lead Channel Impedance Value: 418 Ohm
Lead Channel Impedance Value: 475 Ohm
Lead Channel Pacing Threshold Amplitude: 0.5 V
Lead Channel Pacing Threshold Pulse Width: 0.4 ms
Lead Channel Sensing Intrinsic Amplitude: 12.75 mV
Lead Channel Sensing Intrinsic Amplitude: 12.75 mV
Lead Channel Setting Pacing Amplitude: 2 V
Lead Channel Setting Pacing Pulse Width: 0.4 ms
Lead Channel Setting Sensing Sensitivity: 0.3 mV

## 2021-05-08 NOTE — Progress Notes (Signed)
Remote ICD transmission.   

## 2021-05-10 DIAGNOSIS — E1159 Type 2 diabetes mellitus with other circulatory complications: Secondary | ICD-10-CM | POA: Diagnosis not present

## 2021-05-10 DIAGNOSIS — E785 Hyperlipidemia, unspecified: Secondary | ICD-10-CM | POA: Diagnosis not present

## 2021-05-10 DIAGNOSIS — I152 Hypertension secondary to endocrine disorders: Secondary | ICD-10-CM

## 2021-05-10 DIAGNOSIS — E1169 Type 2 diabetes mellitus with other specified complication: Secondary | ICD-10-CM

## 2021-05-12 IMAGING — DX DG LUMBAR SPINE COMPLETE 4+V
4 series · 4 of 4 positions shown · non-contrast
Comparison: 05/06/2012

CLINICAL DATA: Fell on buttocks 1 week ago, left lumbar pain

EXAM:
LUMBAR SPINE - COMPLETE 4+ VIEW

[lumbar spine ap]
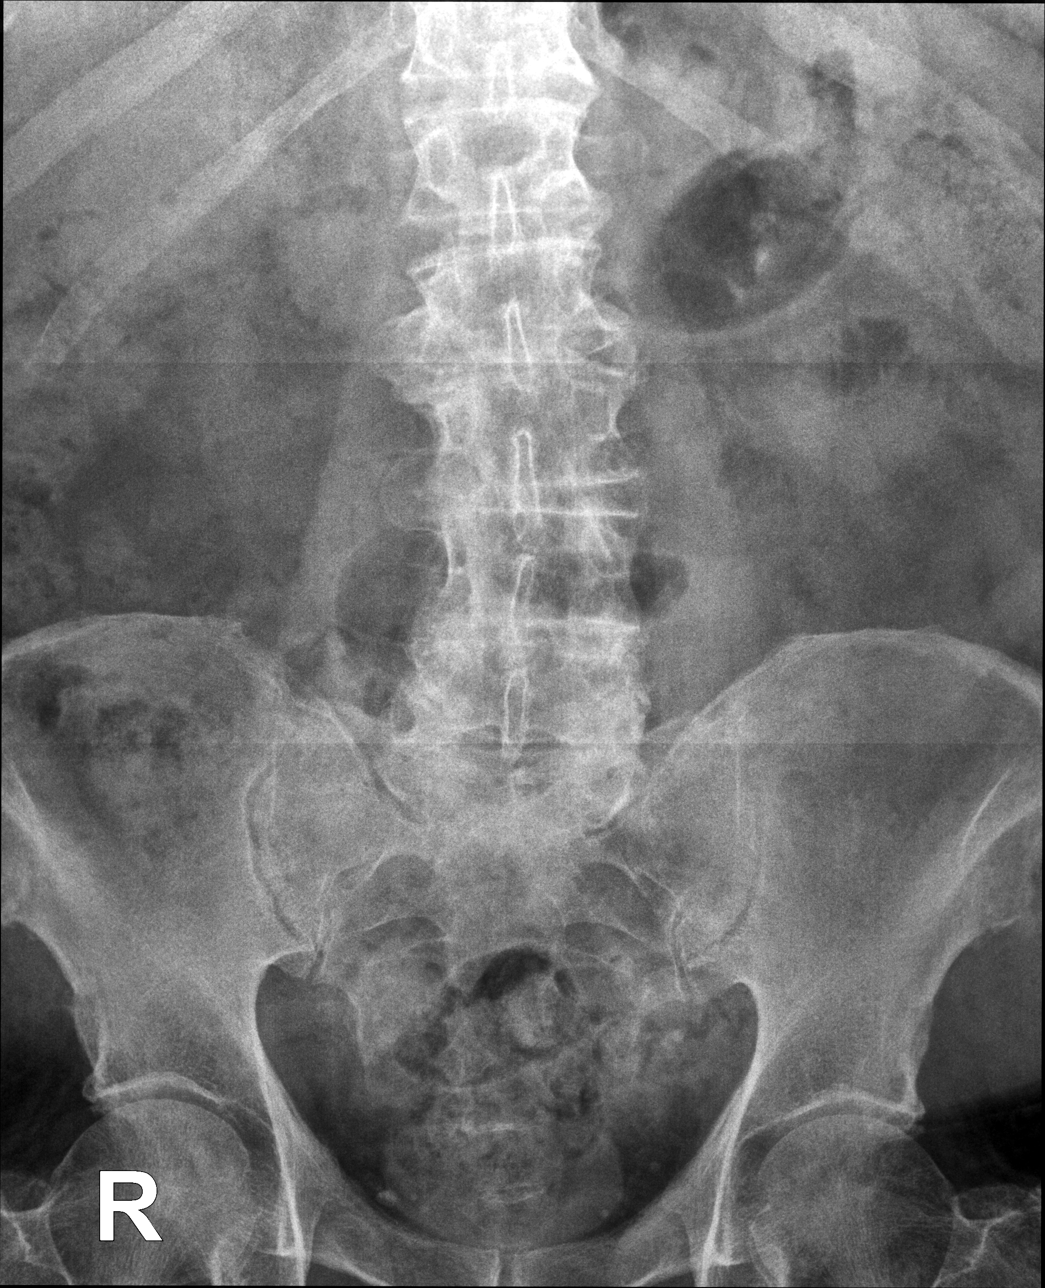

[lumbar spine oblique (1 of 2)]
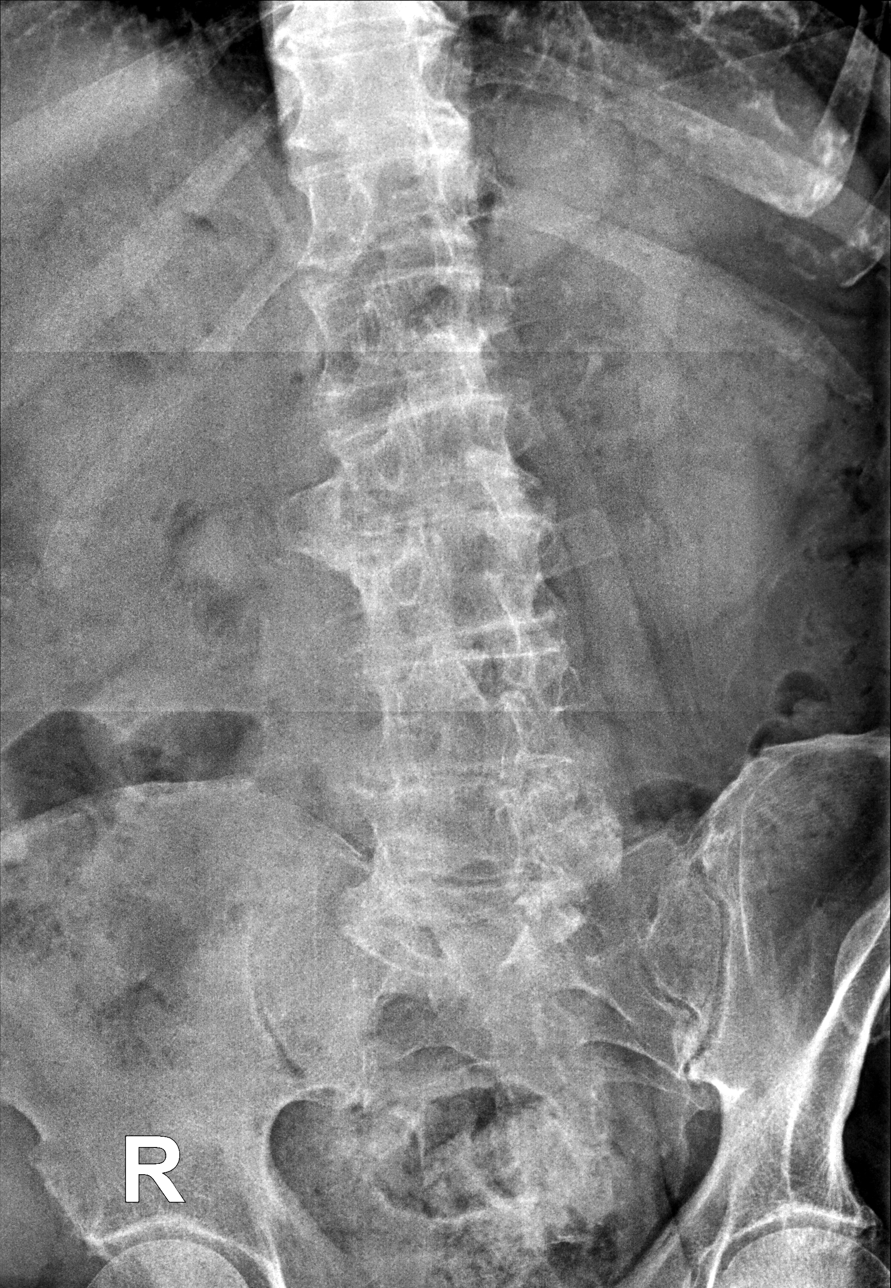

[lumbar spine oblique (2 of 2)]
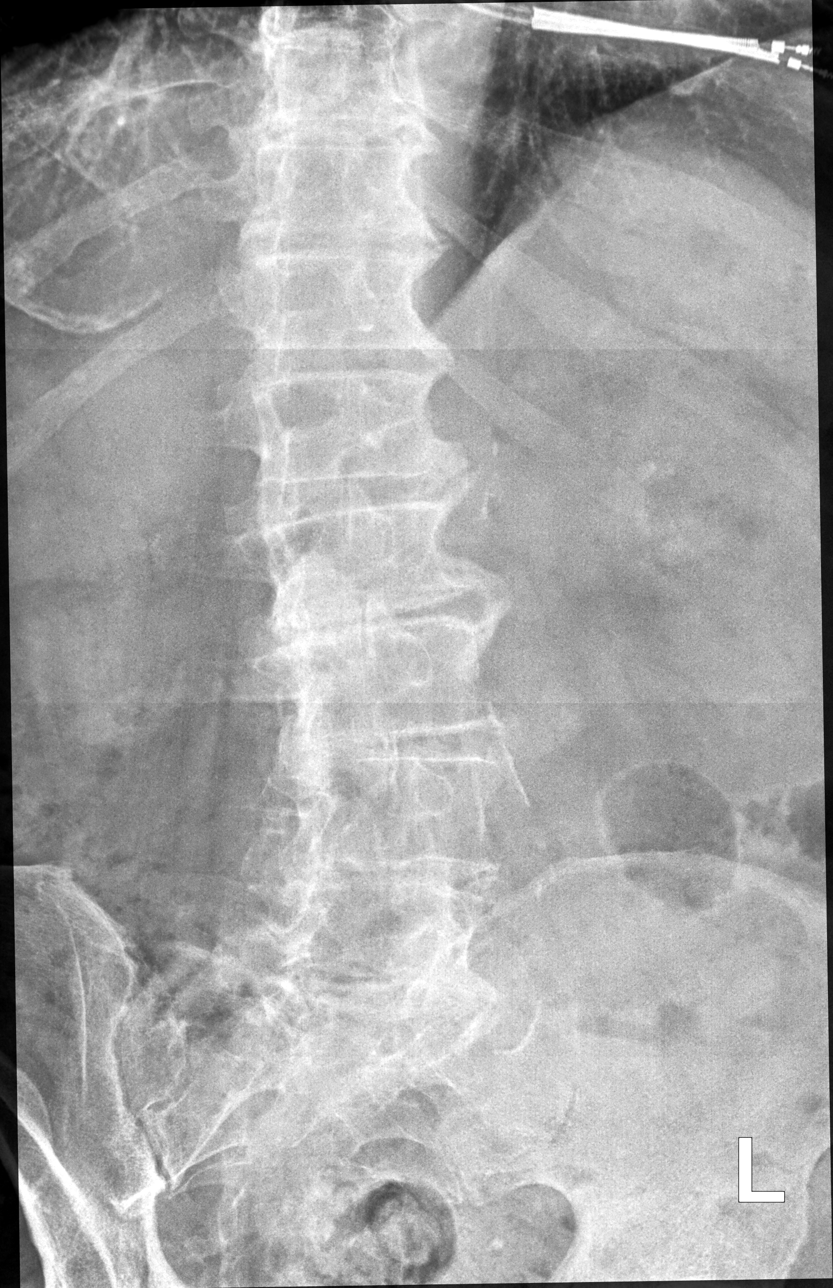

[lumbar spine lat]
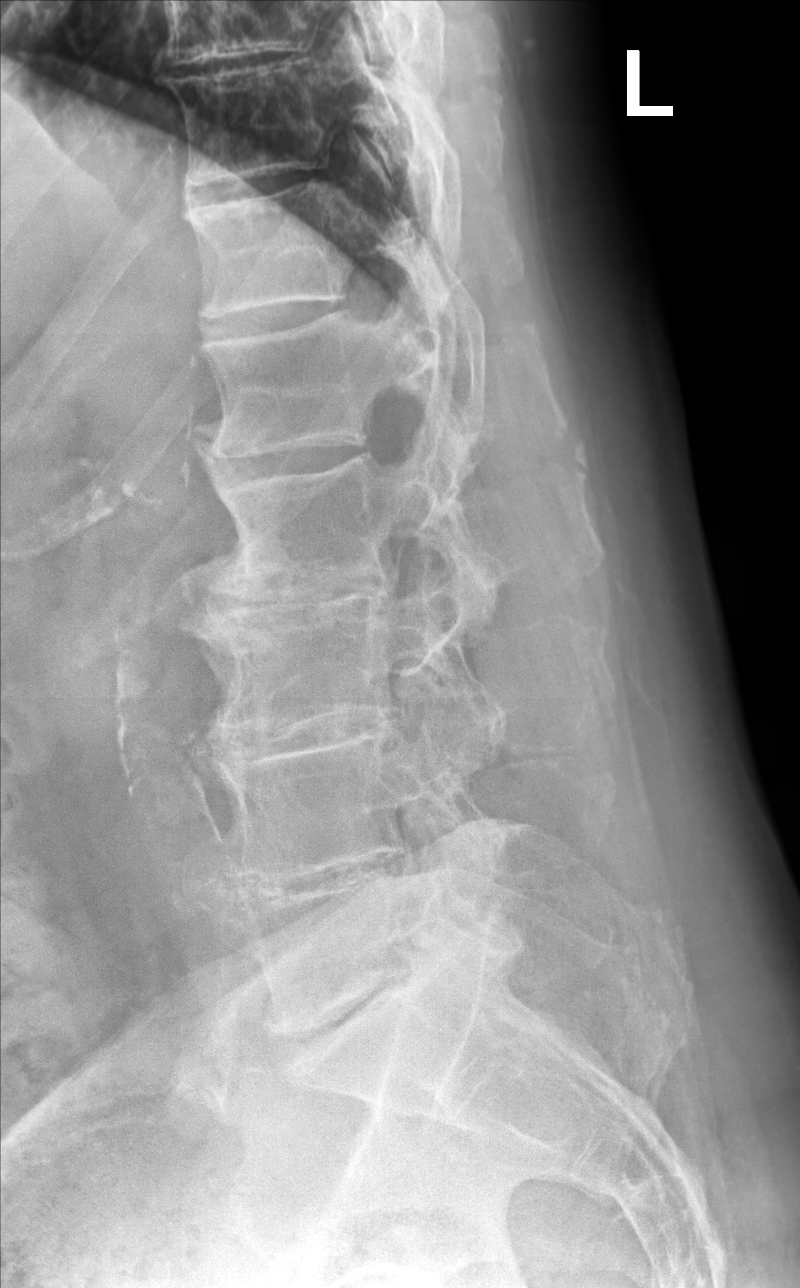

[4 of 4 positions shown; findings below may reference images not displayed]

FINDINGS: Frontal, bilateral oblique, lateral views of the lumbar spine are
obtained. Evaluation is limited by cassette artifact. There is mild
left convex curvature of the lumbar spine unchanged. Extensive
multilevel spondylosis greatest at L2-3 and L5-S1. There is diffuse
facet hypertrophy greatest from L3 through S1. No fractures.
Sacroiliac joints are normal. Vascular calcifications throughout the
aorta.
IMPRESSION: 1. Extensive lower lumbar spondylosis and facet hypertrophy.
2. No acute fracture.

## 2021-05-21 DIAGNOSIS — H5203 Hypermetropia, bilateral: Secondary | ICD-10-CM | POA: Diagnosis not present

## 2021-06-05 ENCOUNTER — Ambulatory Visit (INDEPENDENT_AMBULATORY_CARE_PROVIDER_SITE_OTHER): Payer: Medicare Other

## 2021-06-05 ENCOUNTER — Ambulatory Visit (INDEPENDENT_AMBULATORY_CARE_PROVIDER_SITE_OTHER): Payer: Medicare Other | Admitting: Family Medicine

## 2021-06-05 VITALS — BP 116/60 | HR 68 | Temp 98.7°F | Ht 71.0 in | Wt 226.2 lb

## 2021-06-05 DIAGNOSIS — Z7901 Long term (current) use of anticoagulants: Secondary | ICD-10-CM | POA: Diagnosis not present

## 2021-06-05 DIAGNOSIS — J069 Acute upper respiratory infection, unspecified: Secondary | ICD-10-CM

## 2021-06-05 LAB — POCT INR: INR: 2.7 (ref 2.0–3.0)

## 2021-06-05 NOTE — Patient Instructions (Signed)
Please follow up if symptoms do not improve or as needed.   ? ?You may use salt water gargles, lozenges, tylenol for your sore throat.  ?I recommend an allergy pill like zyrtec, claritin or allegra daily to dry up your congestion and drainage.  ? ?Let us know if things do not resolve.  ?

## 2021-06-05 NOTE — Patient Instructions (Addendum)
Pre visit review using our clinic review tool, if applicable. No additional management support is needed unless otherwise documented below in the visit note. ? ?Continue 1 tablet daily except 1/2 tablet on Mondays, Wednesdays and Fridays.  Re-check in 6 weeks. ?

## 2021-06-05 NOTE — Progress Notes (Signed)
? ? ? ?Subjective  ? ?CC:  ?Chief Complaint  ?Patient presents with  ? Sore Throat  ?  Pt stated that he has been having a sore throat for the past 3 days. He feels like its allergy related but not sure.Very mild headache as well. Flem was clear.  ? ?Same day acute visit; PCP not available. New pt to me. Chart reviewed.  ? ?HPI: Joseph Malone is a 88 y.o. male who presents to the office today to address the problems listed above in the chief complaint. ?Patient complains of typical URI symptoms including nasal congestion, mild sore throat, cough, and mild malaise.  The symptoms have been present for 4 days. He denies high fever but thinks he had a low grade temp a few days ago. No productive cough, shortness of breath or significant GI symptoms.  Over-the-counter cold medicines have been minimally or mildly helpful. Has some sneezing.  ? ?Assessment  ?1. Viral URI   ? ?  ?Plan  ?URI, viral vs allergies: discussed dx; no sign or sx of bacterial infection is present. Treat supportively with antihistamines, decongestants, and/or cough meds. See AVS for care instructions.  ? ?Follow up: prn  ? ?No orders of the defined types were placed in this encounter. ? ?No orders of the defined types were placed in this encounter. ? ?  ? ?I reviewed the patients updated PMH, FH, and SocHx.  ?  ?Patient Active Problem List  ? Diagnosis Date Noted  ? Atherosclerosis of aorta ([place]) 05/18/2020  ? PVC's (premature ventricular contractions) 01/24/2020  ? Melena   ? Long term (current) use of anticoagulants 07/08/2017  ? Nephrolithiasis 03/24/2016  ? Erectile dysfunction 01/19/2014  ? Basal cell cancer 09/29/2012  ? Solitary pulmonary nodule 07/12/2012  ? Chronic systolic heart failure ([place]) 12/09/2010  ? Cardiomyopathy, ischemic 10/02/2009  ? Automatic implantable cardioverter-defibrillator in situ 01/16/2009  ? DM (diabetes mellitus) type II, controlled, with peripheral vascular disorder ([place]) 09/14/2008  ? BPH without  obstruction/lower urinary tract symptoms 10/22/2007  ? Hyperlipidemia associated with type 2 diabetes mellitus ([place]) 04/22/2007  ? Hypertension associated with diabetes ([place]) 09/25/2006  ? CAD in native artery 09/25/2006  ? Osteoarthritis 09/25/2006  ? Hematuria 09/25/2006  ? ?Current Meds  ?Medication Sig  ? acetaminophen (TYLENOL) 500 MG tablet Take 500 mg by mouth at bedtime.   ? aspirin 81 MG tablet Take 1 tablet (81 mg total) by mouth every evening.  ? Blood Glucose Monitoring Suppl (ONE TOUCH ULTRA 2) w/Device KIT Use to test blood sugars daily. Dx: E11.9  ? carvedilol (COREG) 6.25 MG tablet Take 1 tablet (6.25 mg total) by mouth 2 (two) times daily with a meal.  ? Cholecalciferol (VITAMIN D3) 2000 UNITS TABS Take 2,000 Units by mouth daily.  ? folic acid (FOLVITE) 191 MCG tablet Take 400 mcg by mouth daily.  ? furosemide (LASIX) 40 MG tablet Take 40 mg by mouth daily as needed.  ? Glucosamine HCl 1500 MG TABS Take 1,500 mg by mouth every evening.   ? glucose blood test strip Use to test blood sugars daily. Dx: E11.9  ? Lancets (ONETOUCH DELICA PLUS [id number]) MISC Use to test blood sugars daily. Dx: E11.9  ? Melatonin 5 MG CHEW Chew by mouth.  ? metFORMIN (GLUCOPHAGE-XR) 500 MG 24 hr tablet TAKE 1 TABLET(500 MG) BY MOUTH DAILY WITH BREAKFAST  ? Multiple Vitamin (MULTIVITAMIN WITH MINERALS) TABS Take 1 tablet by mouth daily.   ? mupirocin ointment (BACTROBAN) 2 % Apply  topically 2 (two) times daily as needed.  ? OneTouch Delica Lancets 49S MISC USE TO TEST BLOOD SUGAR DAILY  ? potassium chloride SA (KLOR-CON M) 20 MEQ tablet TAKE 1 TABLET BY MOUTH DAILY, GENERIC EQUIVALENT FOR KLOR-CON.  ? simvastatin (ZOCOR) 40 MG tablet Take 0.5 tablets (20 mg total) by mouth at bedtime.  ? spironolactone (ALDACTONE) 25 MG tablet Take 0.5 tablets (12.5 mg total) by mouth daily as needed.  ? tamsulosin (FLOMAX) 0.4 MG CAPS capsule Take 0.4 mg by mouth at bedtime.  ? tiZANidine (ZANAFLEX) 2 MG tablet Take 1 tablet (2 mg total)  by mouth daily as needed for muscle spasms (do not drive for 8 hours after use).  ? triamcinolone cream (KENALOG) 0.1 % SMARTSIG:1 Application Topical 2-3 Times Daily  ? warfarin (COUMADIN) 5 MG tablet TAKE 1/2 TABLET DAILY BY MOUTH EXCEPT TAKE 1 TABLET ON MONDAYS, WEDNESDAYS AND FRIDAYS OR AS DIRECTED BY ANTICOAGULATION CLINIC  ? ? ?Review of Systems: ?Constitutional: Negative for fever malaise or anorexia ?Cardiovascular: negative for chest pain ?Respiratory: negative for SOB or pleuritic chest pain ?Gastrointestinal: negative for abdominal pain ? ?Objective  ?Vitals: BP 116/60   Pulse 68   Temp 98.7 ?F (37.1 ?C)   Ht 5' 11"  (1.803 m)   Wt 226 lb 3.2 oz (102.6 kg)   SpO2 94%   BMI 31.55 kg/m?  ?General: no acute respiratory distress  ?Psych:  Alert and oriented, normal mood and affect ?HEENT: Normocephalic, nasal congestion present, TMs w/o erythema, OP with erythema w/o exudate, no cervical LAD, supple neck  ?Cardiovascular:  RRR without murmur or gallop. no peripheral edema ?Respiratory:  Good breath sounds bilaterally, CTAB with normal respiratory effort ?Neurologic:   Mental status is normal. normal gait ?Commons side effects, risks, benefits, and alternatives for medications and treatment plan prescribed today were discussed, and the patient expressed understanding of the given instructions. Patient is instructed to call or message via MyChart if he/she has any questions or concerns regarding our treatment plan. No barriers to understanding were identified. We discussed Red Flag symptoms and signs in detail. Patient expressed understanding regarding what to do in case of urgent or emergency type symptoms.  ?Medication list was reconciled, printed and provided to the patient in AVS. Patient instructions and summary information was reviewed with the patient as documented in the AVS. ?This note was prepared with assistance of Systems analyst. Occasional wrong-word or sound-a-like  substitutions may have occurred due to the inherent limitations of voice recognition software ? ? ? ?

## 2021-06-05 NOTE — Progress Notes (Signed)
Continue 1 tablet daily except 1/2 tablet on Mondays, Wednesdays and Fridays.  Re-check in 6 weeks. ?

## 2021-06-05 NOTE — Progress Notes (Signed)
I have reviewed and agree with note, evaluation, plan.   Madeline Pho, MD  

## 2021-06-20 ENCOUNTER — Other Ambulatory Visit: Payer: Self-pay | Admitting: Family Medicine

## 2021-06-28 DIAGNOSIS — D225 Melanocytic nevi of trunk: Secondary | ICD-10-CM | POA: Diagnosis not present

## 2021-06-28 DIAGNOSIS — L570 Actinic keratosis: Secondary | ICD-10-CM | POA: Diagnosis not present

## 2021-06-28 DIAGNOSIS — X32XXXD Exposure to sunlight, subsequent encounter: Secondary | ICD-10-CM | POA: Diagnosis not present

## 2021-07-05 ENCOUNTER — Encounter: Payer: Self-pay | Admitting: Internal Medicine

## 2021-07-05 ENCOUNTER — Ambulatory Visit: Payer: Medicare Other | Admitting: Internal Medicine

## 2021-07-05 VITALS — BP 104/64 | HR 76 | Ht 71.0 in | Wt 229.0 lb

## 2021-07-05 DIAGNOSIS — Z95810 Presence of automatic (implantable) cardiac defibrillator: Secondary | ICD-10-CM

## 2021-07-05 DIAGNOSIS — I5022 Chronic systolic (congestive) heart failure: Secondary | ICD-10-CM

## 2021-07-05 DIAGNOSIS — I493 Ventricular premature depolarization: Secondary | ICD-10-CM

## 2021-07-05 NOTE — Progress Notes (Signed)
HPI Joseph Malone returns today for followup. He is a pleasant 88 yo man with chronic systolic heart failure, s/p CABG, prior remote anterior MI, EF 35%. He is s/p ICD insertion for primary prevention remotely. He has lost almost 30 lbs in the past. No chest pain. No ICD therapies.   Allergies  Allergen Reactions   Penicillins Rash and Other (See Comments)    Because of a history of documented adverse serious drug reaction;Medi Alert bracelet  is recommended Has patient had a PCN reaction causing immediate rash, facial/tongue/throat swelling, SOB or lightheadedness with hypotension: No Has patient had a PCN reaction causing severe rash involving mucus membranes or skin necrosis: No Has patient had a PCN reaction that required hospitalization: No Has patient had a PCN reaction occurring within the last 10 years: No If all of the above answers are "NO", t     Current Outpatient Medications  Medication Sig Dispense Refill   acetaminophen (TYLENOL) 500 MG tablet Take 500 mg by mouth at bedtime.      aspirin 81 MG tablet Take 1 tablet (81 mg total) by mouth every evening.     Blood Glucose Monitoring Suppl (ONE TOUCH ULTRA 2) w/Device KIT Use to test blood sugars daily. Dx: E11.9 1 kit 3   carvedilol (COREG) 6.25 MG tablet Take 1 tablet (6.25 mg total) by mouth 2 (two) times daily with a meal. 180 tablet 3   Cholecalciferol (VITAMIN D3) 2000 UNITS TABS Take 2,000 Units by mouth daily.     folic acid (FOLVITE) 161 MCG tablet Take 400 mcg by mouth daily.     furosemide (LASIX) 40 MG tablet Take 40 mg by mouth daily as needed.     Glucosamine HCl 1500 MG TABS Take 1,500 mg by mouth every evening.      glucose blood test strip Use to test blood sugars daily. Dx: E11.9 100 each 12   Lancets (ONETOUCH DELICA PLUS [id number]) MISC Use to test blood sugars daily. Dx: E11.9 100 each 3   Melatonin 5 MG CHEW Chew by mouth.     metFORMIN (GLUCOPHAGE-XR) 500 MG 24 hr tablet TAKE 1 TABLET(500 MG) BY  MOUTH DAILY WITH BREAKFAST 30 tablet 5   Multiple Vitamin (MULTIVITAMIN WITH MINERALS) TABS Take 1 tablet by mouth daily.      mupirocin ointment (BACTROBAN) 2 % Apply topically 2 (two) times daily as needed.     OneTouch Delica Lancets 81X MISC USE TO TEST BLOOD SUGAR DAILY 100 each 3   potassium chloride SA (KLOR-CON M) 20 MEQ tablet TAKE 1 TABLET BY MOUTH DAILY, GENERIC EQUIVALENT FOR KLOR-CON. 90 tablet 3   simvastatin (ZOCOR) 40 MG tablet Take 0.5 tablets (20 mg total) by mouth at bedtime. 45 tablet 3   spironolactone (ALDACTONE) 25 MG tablet Take 0.5 tablets (12.5 mg total) by mouth daily as needed. 30 tablet 1   tamsulosin (FLOMAX) 0.4 MG CAPS capsule Take 0.4 mg by mouth at bedtime.     tiZANidine (ZANAFLEX) 2 MG tablet TAKE 1 TABLET(2 MG) BY MOUTH DAILY AS NEEDED FOR MUSCLE SPASMS. DO NOT DRIVE FOR 8 HOURS AFTER USE 90 tablet 0   triamcinolone cream (KENALOG) 0.1 % SMARTSIG:1 Application Topical 2-3 Times Daily     warfarin (COUMADIN) 5 MG tablet TAKE 1/2 TABLET DAILY BY MOUTH EXCEPT TAKE 1 TABLET ON MONDAYS, WEDNESDAYS AND FRIDAYS OR AS DIRECTED BY ANTICOAGULATION CLINIC 100 tablet 1   losartan (COZAAR) 25 MG tablet Take 1 tablet (25 mg  total) by mouth daily. 90 tablet 2   No current facility-administered medications for this visit.     Past Medical History:  Diagnosis Date   Anxiety    BPH (benign prostatic hypertrophy)    CAD (coronary artery disease)    s/p CABG   CHF (congestive heart failure) ([place])    Diverticulosis    Diverticulosis of colon without hemorrhage 10/22/2007   Qualifier: Diagnosis of  By: Linna Darner MD, [name]     DOE (dyspnea on exertion)    Excess weight    Fasting hyperglycemia    Generalized weakness    GERD (gastroesophageal reflux disease)    Heart attack ([place]) 2001   Heart failure    CHF due to ischemic CM   History of kidney stones    Hyperlipidemia    Hypertension    Ischemic cardiomyopathy    Microscopic hematuria    [facility]    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   Past Surgical History:  Procedure Laterality Date   APPENDECTOMY     BIOPSY  10/21/2018   Procedure: BIOPSY;  Surgeon: Yetta Flock, MD;  Location: [facility];  Service: Gastroenterology;;   [place]  2005   Medtronic; s/p ICD gen change and RV lead revision April 2015   CATARACT EXTRACTION  2008   bilateral with lens implant   COLONOSCOPY  2004   Tics; [facility]   CORONARY ARTERY BYPASS GRAFT  2000   1 vessel   CYSTOSCOPY  06/2012   Dr [name]   ESOPHAGOGASTRODUODENOSCOPY (EGD) WITH PROPOFOL N/A 10/21/2018   Procedure: ESOPHAGOGASTRODUODENOSCOPY (EGD) WITH PROPOFOL;  Surgeon: Yetta Flock, MD;  Location: [facility];  Service: Gastroenterology;  Laterality: N/A;   HOT HEMOSTASIS N/A 10/21/2018   Procedure: HOT HEMOSTASIS (ARGON PLASMA COAGULATION/BICAP);  Surgeon: Yetta Flock, MD;  Location: [facility];  Service: Gastroenterology;  Laterality: N/A;   IMPLANTABLE CARDIOVERTER DEFIBRILLATOR (ICD) GENERATOR CHANGE N/A 06/01/2013   Procedure: ICD GENERATOR CHANGE;  Surgeon: [name], MD;  Location: [facility] CATH LAB;  Service: Cardiovascular;  Laterality: N/A;   LEAD REVISION N/A 06/01/2013   Procedure: LEAD REVISION;  Surgeon: [name], MD;  Location: [facility] CATH LAB;  Service: Cardiovascular;  Laterality: N/A;   PILONIDAL CYST EXCISION     RIGHT/LEFT HEART CATH AND CORONARY/GRAFT ANGIOGRAPHY N/A 06/12/2017   Procedure: RIGHT/LEFT HEART CATH AND CORONARY/GRAFT ANGIOGRAPHY;  Surgeon: [name], MD;  Location: [facility];  Service: Cardiovascular;  Laterality: N/A;   TRANSTHORACIC ECHOCARDIOGRAM  08/29/2005     Family History  Problem Relation Age of Onset   Hypertension Father    Heart attack Father 75   Diabetes Brother        X 2   Nephrolithiasis Brother     Heart attack Brother 40   Heart attack Paternal Uncle 59   Sudden death Paternal Grandfather 10       ? heat stroke   Lung cancer Sister        "Asian lung cancer"   Stroke Neg Hx    Colon cancer Neg Hx    Esophageal cancer Neg Hx    Rectal cancer Neg Hx    Stomach cancer Neg Hx      Social History   Socioeconomic History   Marital status: Married    Spouse  name: Not on file   Number of children: Not on file   Years of education: Not on file   Highest education level: Not on file  Occupational History   Occupation: Retired  Tobacco Use   Smoking status: Never    Passive exposure: Yes   Smokeless tobacco: Never  Vaping Use   Vaping Use: Never used  Substance and Sexual Activity   Alcohol use: No   Drug use: No   Sexual activity: Never  Other Topics Concern   Not on file  Social History Narrative   Married 1957. 4 children 2 boys 2 girls. 15 grandkids.  4 greatgrandkids.       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       Hobbies: genealogy, travel      No HCPOA-advised to do this.    Social Determinants of Health   Financial Resource Strain: Low Risk    Difficulty of Paying Living Expenses: Not hard at all  Food Insecurity: No Food Insecurity   Worried About Charity fundraiser in the Last Year: Never true   Carlos in the Last Year: Never true  Transportation Needs: No Transportation Needs   Lack of Transportation (Medical): No   Lack of Transportation (Non-Medical): No  Physical Activity: Sufficiently Active   Days of Exercise per Week: 3 days   Minutes of Exercise per Session: 50 min  Stress: No Stress Concern Present   Feeling of Stress : Only a little  Social Connections: Engineer, building services of Communication with Friends and Family: More than three times a week   Frequency of Social Gatherings with Friends and Family: More than three times a week   Attends Religious Services: More than  4 times per year   Active Member of [employer] or Organizations: Yes   Attends Archivist Meetings: 1 to 4 times per year   Marital Status: Married  Human resources officer Violence: Not At Risk   Fear of Current or Ex-Partner: No   Emotionally Abused: No   Physically Abused: No   Sexually Abused: No     BP 104/64   Pulse 76   Ht 5' 11"  (1.803 m)   Wt 229 lb (103.9 kg)   SpO2 93%   BMI 31.94 kg/m   Physical Exam:  Well appearing NAD HEENT: Unremarkable Neck:  No JVD, no thyromegally Lymphatics:  No adenopathy Back:  No CVA tenderness Lungs:  Clear with no wheezes HEART:  Regular rate rhythm, no murmurs, no rubs, no clicks Abd:  soft, positive bowel sounds, no organomegally, no rebound, no guarding Ext:  2 plus pulses, no edema, no cyanosis, no clubbing Skin:  No rashes no nodules Neuro:  CN II through XII intact, motor grossly intact  EKG - nsr with PAC's  DEVICE  Normal device function.  See PaceArt for details.   Assess/Plan:  1. Chronic systolic heart failure - his symptoms have improved.Marland Kitchen He will continue his current meds. 2. ICD - his medtronic single chamber ICD is working normally. We will recheck in several months. 3. CAD - he denies anginal symptoms. No change in meds except as noted above. 4. Dyslipidemia - he will continue his statin therapy.   Carleene Overlie Kesa Birky,MD

## 2021-07-05 NOTE — Patient Instructions (Signed)
Medication Instructions:  Your physician recommends that you continue on your current medications as directed. Please refer to the Current Medication list given to you today.  Labwork: None ordered.  Testing/Procedures: None ordered.  Follow-Up: Your physician wants you to follow-up in: one year with Cristopher Peru, MD or one of the following Advanced Practice Providers on your designated Care Team:   Tommye Standard, [place] Legrand Como "Jonni Sanger" Chalmers Cater, [place]  Remote monitoring is used to monitor your ICD from home. This monitoring reduces the number of office visits required to check your device to one time per year. It allows [place] to keep an eye on the functioning of your device to ensure it is working properly. You are scheduled for a device check from home on 08/02/2021. You may send your transmission at any time that day. If you have a wireless device, the transmission will be sent automatically. After your physician reviews your transmission, you will receive a postcard with your next transmission date.  Any Other Special Instructions Will Be Listed Below (If Applicable).  If you need a refill on your cardiac medications before your next appointment, please call your pharmacy.   Important Information About Sugar

## 2021-07-16 ENCOUNTER — Telehealth: Payer: Self-pay | Admitting: Pharmacist

## 2021-07-16 NOTE — Progress Notes (Signed)
  Chronic Care Management Pharmacy Assistant   Name: Joseph Malone  MRN: [id number] DOB: 09/20/1930   Reason for Encounter: Diabetes Adherence Call    Recent office visits:  06/05/2021 [facility] (PCP) Andy, Camille L, MD; I recommend an allergy pill like zyrtec, claritin or allegra daily to dry up your congestion and drainage.   Recent consult visits:  07/05/2021 [facility] (Cardiology) Taylor, Gregg W, MD; no medication changes indicated.  Hospital visits:  None in previous 6 months  Medications: Outpatient Encounter Medications as of 07/16/2021  Medication Sig   acetaminophen (TYLENOL) 500 MG tablet Take 500 mg by mouth at bedtime.    aspirin 81 MG tablet Take 1 tablet (81 mg total) by mouth every evening.   Blood Glucose Monitoring Suppl (ONE TOUCH ULTRA 2) w/Device KIT Use to test blood sugars daily. Dx: E11.9   carvedilol (COREG) 6.25 MG tablet Take 1 tablet (6.25 mg total) by mouth 2 (two) times daily with a meal.   Cholecalciferol (VITAMIN D3) 2000 UNITS TABS Take 2,000 Units by mouth daily.   folic acid (FOLVITE) 400 MCG tablet Take 400 mcg by mouth daily.   furosemide (LASIX) 40 MG tablet Take 40 mg by mouth daily as needed.   Glucosamine HCl 1500 MG TABS Take 1,500 mg by mouth every evening.    glucose blood test strip Use to test blood sugars daily. Dx: E11.9   Lancets (ONETOUCH DELICA PLUS LANCET30G) MISC Use to test blood sugars daily. Dx: E11.9   losartan (COZAAR) 25 MG tablet Take 1 tablet (25 mg total) by mouth daily.   Melatonin 5 MG CHEW Chew by mouth.   metFORMIN (GLUCOPHAGE-XR) 500 MG 24 hr tablet TAKE 1 TABLET(500 MG) BY MOUTH DAILY WITH BREAKFAST   Multiple Vitamin (MULTIVITAMIN WITH MINERALS) TABS Take 1 tablet by mouth daily.    mupirocin ointment (BACTROBAN) 2 % Apply topically 2 (two) times daily as needed.   OneTouch Delica Lancets 30G MISC USE TO TEST BLOOD SUGAR DAILY   potassium chloride SA (KLOR-CON M) 20 MEQ tablet TAKE 1 TABLET BY MOUTH DAILY, GENERIC EQUIVALENT  FOR KLOR-CON.   simvastatin (ZOCOR) 40 MG tablet Take 0.5 tablets (20 mg total) by mouth at bedtime.   spironolactone (ALDACTONE) 25 MG tablet Take 0.5 tablets (12.5 mg total) by mouth daily as needed.   tamsulosin (FLOMAX) 0.4 MG CAPS capsule Take 0.4 mg by mouth at bedtime.   tiZANidine (ZANAFLEX) 2 MG tablet TAKE 1 TABLET(2 MG) BY MOUTH DAILY AS NEEDED FOR MUSCLE SPASMS. DO NOT DRIVE FOR 8 HOURS AFTER USE   triamcinolone cream (KENALOG) 0.1 % SMARTSIG:1 Application Topical 2-3 Times Daily   warfarin (COUMADIN) 5 MG tablet TAKE 1/2 TABLET DAILY BY MOUTH EXCEPT TAKE 1 TABLET ON MONDAYS, WEDNESDAYS AND FRIDAYS OR AS DIRECTED BY ANTICOAGULATION CLINIC   No facility-administered encounter medications on file as of 07/16/2021.   Recent Relevant Labs: Lab Results  Component Value Date/Time   HGBA1C 6.7 (H) 03/11/2021 11:58 AM   HGBA1C 7.7 (H) 09/06/2020 11:34 AM   MICROALBUR 1.3 05/17/2020 10:11 AM   MICROALBUR 1.1 01/11/2016 08:36 AM    Kidney Function Lab Results  Component Value Date/Time   CREATININE 1.26 03/11/2021 11:58 AM   CREATININE 1.18 09/06/2020 11:34 AM   GFR 50.72 (L) 03/11/2021 11:58 AM   GFRNONAA 40 (L) 10/01/2018 02:26 PM   GFRAA 46 (L) 10/01/2018 02:26 PM    Current antihyperglycemic regimen:  Metformin 500 mg daily  What recent interventions/DTPs have been made   to improve glycemic control:  No recent interventions or DTPs.  Have there been any recent hospitalizations or ED visits since last visit with [facility]PP? No  Patient denies hypoglycemic symptoms.  Patient denies hyperglycemic symptoms.  How often are you checking your blood sugar? once daily  What are your blood sugars ranging?  Fasting: 110, 115  During the week, how often does your blood glucose drop below 70? Never  Are you checking your feet daily/regularly? Yes  Adherence Review: Is the patient currently on a STATIN medication? Yes Is the patient currently on ACE/ARB medication? Yes Does the  patient have >5 day gap between last estimated fill dates? No  -Patient states he has been working on eating a low carb diet. He has cut out most breads, rice and pasta. He has also slowed down on drinking soda.   Care Gaps: Medicare Annual Wellness: Completed 09/03/2020 Ophthalmology Exam: Completed 08/10/2020 Hemoglobin A1C: 6.7% on 03/11/2021 Colonoscopy: Aged out  Future Appointments  Date Time Provider [place]  07/17/2021 10:30 AM [facility]  08/02/2021  3:35 PM [facility] DEVICE REMOTES [facility]  09/09/2021 11:00 AM Marin Olp, MD [facility]  09/13/2021  1:00 PM [facility] HEALTH COACH [facility]  10/22/2021  2:15 PM [facility] CCM PHARMACIST [facility]  11/01/2021  3:35 PM [facility] DEVICE REMOTES [facility]  01/31/2022  3:35 PM [facility] DEVICE REMOTES [facility]  05/02/2022  3:35 PM [facility] DEVICE REMOTES [facility]   Star Rating Drugs: Losartan 25 mg last filled 05/22/2021 90 DS Simvastatin 40 mg last filled 04/01/2021 90 DS Metformin ER 500 mg last filled 06/26/2021 90 DS  April D Calhoun, [place] Pharmacist Assistant [phone]

## 2021-07-17 ENCOUNTER — Ambulatory Visit (INDEPENDENT_AMBULATORY_CARE_PROVIDER_SITE_OTHER): Payer: Medicare Other

## 2021-07-17 DIAGNOSIS — Z7901 Long term (current) use of anticoagulants: Secondary | ICD-10-CM | POA: Diagnosis not present

## 2021-07-17 LAB — POCT INR: INR: 2 (ref 2.0–3.0)

## 2021-07-17 NOTE — Patient Instructions (Addendum)
Pre visit review using our clinic review tool, if applicable. No additional management support is needed unless otherwise documented below in the visit note.  Continue 1 tablet daily except 1/2 tablet on Mondays, Wednesdays and Fridays.  Re-check in 6 weeks.

## 2021-07-17 NOTE — Progress Notes (Signed)
I have reviewed and agree with note, evaluation, plan.   Earnestine Tuohey, MD  

## 2021-07-17 NOTE — Progress Notes (Signed)
Continue 1 tablet daily except 1/2 tablet on Mondays, Wednesdays and Fridays.  Re-check in 6 weeks.

## 2021-08-01 ENCOUNTER — Telehealth: Payer: Self-pay | Admitting: Pharmacist

## 2021-08-01 NOTE — Progress Notes (Signed)
Chronic Care Management Pharmacy Assistant   Name: XIOMAR CROMPTON  MRN: [id number] DOB: 1931/01/06  Reason for Encounter: Diabetes Adherence Call    Recent office visits:  None  Recent consult visits:  None  Hospital visits:  None in previous 6 months  Medications: Outpatient Encounter Medications as of 08/01/2021  Medication Sig   acetaminophen (TYLENOL) 500 MG tablet Take 500 mg by mouth at bedtime.    aspirin 81 MG tablet Take 1 tablet (81 mg total) by mouth every evening.   Blood Glucose Monitoring Suppl (ONE TOUCH ULTRA 2) w/Device KIT Use to test blood sugars daily. Dx: E11.9   carvedilol (COREG) 6.25 MG tablet Take 1 tablet (6.25 mg total) by mouth 2 (two) times daily with a meal.   Cholecalciferol (VITAMIN D3) 2000 UNITS TABS Take 2,000 Units by mouth daily.   folic acid (FOLVITE) 458 MCG tablet Take 400 mcg by mouth daily.   furosemide (LASIX) 40 MG tablet Take 40 mg by mouth daily as needed.   Glucosamine HCl 1500 MG TABS Take 1,500 mg by mouth every evening.    glucose blood test strip Use to test blood sugars daily. Dx: E11.9   Lancets (ONETOUCH DELICA PLUS [id number]) MISC Use to test blood sugars daily. Dx: E11.9   losartan (COZAAR) 25 MG tablet Take 1 tablet (25 mg total) by mouth daily.   Melatonin 5 MG CHEW Chew by mouth.   metFORMIN (GLUCOPHAGE-XR) 500 MG 24 hr tablet TAKE 1 TABLET(500 MG) BY MOUTH DAILY WITH BREAKFAST   Multiple Vitamin (MULTIVITAMIN WITH MINERALS) TABS Take 1 tablet by mouth daily.    mupirocin ointment (BACTROBAN) 2 % Apply topically 2 (two) times daily as needed.   OneTouch Delica Lancets 86N MISC USE TO TEST BLOOD SUGAR DAILY   potassium chloride SA (KLOR-CON M) 20 MEQ tablet TAKE 1 TABLET BY MOUTH DAILY, GENERIC EQUIVALENT FOR KLOR-CON.   simvastatin (ZOCOR) 40 MG tablet Take 0.5 tablets (20 mg total) by mouth at bedtime.   spironolactone (ALDACTONE) 25 MG tablet Take 0.5 tablets (12.5 mg total) by mouth daily as needed.   tamsulosin  (FLOMAX) 0.4 MG CAPS capsule Take 0.4 mg by mouth at bedtime.   tiZANidine (ZANAFLEX) 2 MG tablet TAKE 1 TABLET(2 MG) BY MOUTH DAILY AS NEEDED FOR MUSCLE SPASMS. DO NOT DRIVE FOR 8 HOURS AFTER USE   triamcinolone cream (KENALOG) 0.1 % SMARTSIG:1 Application Topical 2-3 Times Daily   warfarin (COUMADIN) 5 MG tablet TAKE 1/2 TABLET DAILY BY MOUTH EXCEPT TAKE 1 TABLET ON MONDAYS, WEDNESDAYS AND FRIDAYS OR AS DIRECTED BY ANTICOAGULATION CLINIC   No facility-administered encounter medications on file as of 08/01/2021.   Recent Relevant Labs: Lab Results  Component Value Date/Time   HGBA1C 6.7 (H) 03/11/2021 11:58 AM   HGBA1C 7.7 (H) 09/06/2020 11:34 AM   MICROALBUR 1.3 05/17/2020 10:11 AM   MICROALBUR 1.1 01/11/2016 08:36 AM    Kidney Function Lab Results  Component Value Date/Time   CREATININE 1.26 03/11/2021 11:58 AM   CREATININE 1.18 09/06/2020 11:34 AM   GFR 50.72 (L) 03/11/2021 11:58 AM   GFRNONAA 40 (L) 10/01/2018 02:26 PM   GFRAA 46 (L) 10/01/2018 02:26 PM    Current antihyperglycemic regimen:  Metformin 500 mg daily  What recent interventions/DTPs have been made to improve glycemic control:    Have there been any recent hospitalizations or ED visits since last visit with [facility]?   Patient  hypoglycemic symptoms, including   Patient  hyperglycemic symptoms, including  How often are you checking your blood sugar?   What are your blood sugars ranging?  Fasting:  Before meals:  After meals:  Bedtime:   During the week, how often does your blood glucose drop below 70?   Are you checking your feet daily/regularly?   Adherence Review: Is the patient currently on a STATIN medication? Yes Is the patient currently on ACE/ARB medication? Yes Does the patient have >5 day gap between last estimated fill dates? No  **Unsuccessful attempt at reaching patient to complete this call.**  Care Gaps: Medicare Annual Wellness: Completed 09/03/2020 Ophthalmology Exam: Completed  08/10/2020 Hemoglobin A1C: 6.7% on 03/11/2021 Colonoscopy: Aged out  Future Appointments  Date Time Provider [place]  08/02/2021  3:35 PM [facility] DEVICE REMOTES [facility]  08/28/2021 10:30 AM [facility]  09/09/2021 11:00 AM Marin Olp, MD [facility]  09/13/2021  1:00 PM [facility] HEALTH COACH [facility]  10/22/2021  2:15 PM [facility] CCM PHARMACIST [facility]  11/01/2021  3:35 PM [facility] DEVICE REMOTES [facility]  01/31/2022  3:35 PM [facility] DEVICE REMOTES [facility]  05/02/2022  3:35 PM [facility] DEVICE REMOTES [facility]   Star Rating Drugs: Metformin 500 mg last filled 06/26/2021 90 DS Losartan 25 mg last filled 05/22/2021 90 DS Simvastatin 40 mg last filled 04/01/2021 90 DS  April D Calhoun, [place] Pharmacist Assistant [phone]

## 2021-08-02 ENCOUNTER — Other Ambulatory Visit (HOSPITAL_COMMUNITY): Payer: Self-pay | Admitting: Cardiology

## 2021-08-02 ENCOUNTER — Ambulatory Visit (INDEPENDENT_AMBULATORY_CARE_PROVIDER_SITE_OTHER): Payer: Medicare Other

## 2021-08-02 DIAGNOSIS — I255 Ischemic cardiomyopathy: Secondary | ICD-10-CM

## 2021-08-02 MED ORDER — FUROSEMIDE 40 MG PO TABS: 40.0000 mg | ORAL_TABLET | Freq: Every day | ORAL | 3 refills | Status: DC | PRN

## 2021-08-06 LAB — CUP PACEART REMOTE DEVICE CHECK
Battery Remaining Longevity: 44 mo
Battery Voltage: 2.98 V
Brady Statistic RV Percent Paced: 0.07 %
Date Time Interrogation Session: 20230623042505
HighPow Impedance: 84 Ohm
Implantable Lead Implant Date: 20150422
Implantable Lead Location: 753860
Implantable Lead Model: 6935
Implantable Pulse Generator Implant Date: 20150422
Lead Channel Impedance Value: 456 Ohm
Lead Channel Impedance Value: 513 Ohm
Lead Channel Pacing Threshold Amplitude: 0.5 V
Lead Channel Pacing Threshold Pulse Width: 0.4 ms
Lead Channel Sensing Intrinsic Amplitude: 14.25 mV
Lead Channel Sensing Intrinsic Amplitude: 14.25 mV
Lead Channel Setting Pacing Amplitude: 2 V
Lead Channel Setting Pacing Pulse Width: 0.4 ms
Lead Channel Setting Sensing Sensitivity: 0.3 mV

## 2021-08-07 NOTE — Progress Notes (Signed)
Remote ICD transmission.   

## 2021-08-27 ENCOUNTER — Other Ambulatory Visit: Payer: Self-pay

## 2021-08-27 DIAGNOSIS — I5022 Chronic systolic (congestive) heart failure: Secondary | ICD-10-CM

## 2021-08-28 ENCOUNTER — Other Ambulatory Visit (HOSPITAL_COMMUNITY): Payer: Self-pay

## 2021-08-28 ENCOUNTER — Ambulatory Visit (INDEPENDENT_AMBULATORY_CARE_PROVIDER_SITE_OTHER): Payer: Medicare Other

## 2021-08-28 DIAGNOSIS — Z7901 Long term (current) use of anticoagulants: Secondary | ICD-10-CM

## 2021-08-28 DIAGNOSIS — I5022 Chronic systolic (congestive) heart failure: Secondary | ICD-10-CM

## 2021-08-28 LAB — POCT INR: INR: 1.7 — AB (ref 2.0–3.0)

## 2021-08-28 MED ORDER — LOSARTAN POTASSIUM 25 MG PO TABS: 25.0000 mg | ORAL_TABLET | Freq: Every day | ORAL | 3 refills | Status: DC

## 2021-08-28 NOTE — Patient Instructions (Addendum)
Pre visit review using our clinic review tool, if applicable. No additional management support is needed unless otherwise documented below in the visit note.  Increase dose today to take 1 1/2 tablets and then change weekly dose to take 1 tablet daily except 1/2 tablet on Mondays and Thursdays.  Re-check in 3 weeks.

## 2021-08-28 NOTE — Progress Notes (Addendum)
Pt missed a dose about 2 weeks ago. Pt also reports he is now on a low carbohydrate diet and is eating more vegetables. Pt has lost 20 lbs since starting the diet. Due to changes in diet and pt being al the lowest end of his INR range at the last visit there was a change made to weekly dose to cover for the increase in vegetable intake.  Increase dose today to take 1 1/2 tablets and then change weekly dose to take 1 tablet daily except 1/2 tablet on Mondays and Thursdays.  Re-check in 3 weeks.

## 2021-09-06 ENCOUNTER — Ambulatory Visit (INDEPENDENT_AMBULATORY_CARE_PROVIDER_SITE_OTHER): Payer: Medicare Other

## 2021-09-06 DIAGNOSIS — Z0000 Encounter for general adult medical examination without abnormal findings: Secondary | ICD-10-CM | POA: Diagnosis not present

## 2021-09-06 NOTE — Patient Instructions (Signed)
[name] , Thank you for taking time to come for your Medicare Wellness Visit. I appreciate your ongoing commitment to your health goals. Please review the following plan we discussed and let me know if I can assist you in the future.   Screening recommendations/referrals: Colonoscopy: no longer required  Recommended yearly ophthalmology/optometry visit for glaucoma screening and checkup Recommended yearly dental visit for hygiene and checkup  Vaccinations: Influenza vaccine: Done 10/24/20 repeat every year  Pneumococcal vaccine: Up to date Tdap vaccine: done 10/31/19 repeat every 10 years  Shingles vaccine: 5/18, 08/27/20 Covid-19: completed 1/24, 2/11, 01/20/20 & 06/27/20  Advanced directives: Advance directive discussed with you today. Even though you declined this today please call our office should you change your mind and we can give you the proper paperwork for you to fill out.  Conditions/risks identified: get blood sugar down and eat healthy  Next appointment: Follow up in one year for your annual wellness visit.   Preventive Care 74 Years and Older, Male Preventive care refers to lifestyle choices and visits with your health care provider that can promote health and wellness. What does preventive care include? A yearly physical exam. This is also called an annual well check. Dental exams once or twice a year. Routine eye exams. Ask your health care provider how often you should have your eyes checked. Personal lifestyle choices, including: Daily care of your teeth and gums. Regular physical activity. Eating a healthy diet. Avoiding tobacco and drug use. Limiting alcohol use. Practicing safe sex. Taking low doses of aspirin every day. Taking vitamin and mineral supplements as recommended by your health care provider. What happens during an annual well check? The services and screenings done by your health care provider during your annual well check will depend on your age,  overall health, lifestyle risk factors, and family history of disease. Counseling  Your health care provider may ask you questions about your: Alcohol use. Tobacco use. Drug use. Emotional well-being. Home and relationship well-being. Sexual activity. Eating habits. History of falls. Memory and ability to understand (cognition). Work and work Statistician. Screening  You may have the following tests or measurements: Height, weight, and BMI. Blood pressure. Lipid and cholesterol levels. These may be checked every 5 years, or more frequently if you are over 49 years old. Skin check. Lung cancer screening. You may have this screening every year starting at age 61 if you have a 30-pack-year history of smoking and currently smoke or have quit within the past 15 years. Fecal occult blood test (FOBT) of the stool. You may have this test every year starting at age 66. Flexible sigmoidoscopy or colonoscopy. You may have a sigmoidoscopy every 5 years or a colonoscopy every 10 years starting at age 69. Prostate cancer screening. Recommendations will vary depending on your family history and other risks. Hepatitis C blood test. Hepatitis B blood test. Sexually transmitted disease (STD) testing. Diabetes screening. This is done by checking your blood sugar (glucose) after you have not eaten for a while (fasting). You may have this done every 1-3 years. Abdominal aortic aneurysm (AAA) screening. You may need this if you are a current or former smoker. Osteoporosis. You may be screened starting at age 77 if you are at high risk. Talk with your health care provider about your test results, treatment options, and if necessary, the need for more tests. Vaccines  Your health care provider may recommend certain vaccines, such as: Influenza vaccine. This is recommended every year. Tetanus, diphtheria,  and acellular pertussis (Tdap, Td) vaccine. You may need a Td booster every 10 years. Zoster vaccine.  You may need this after age 30. Pneumococcal 13-valent conjugate (PCV13) vaccine. One dose is recommended after age 38. Pneumococcal polysaccharide (PPSV23) vaccine. One dose is recommended after age 58. Talk to your health care provider about which screenings and vaccines you need and how often you need them. This information is not intended to replace advice given to you by your health care provider. Make sure you discuss any questions you have with your health care provider. Document Released: 02/23/2015 Document Revised: 10/17/2015 Document Reviewed: 11/28/2014 Elsevier Interactive Patient Education  2017 [employer] Prevention in the Home Falls can cause injuries. They can happen to people of all ages. There are many things you can do to make your home safe and to help prevent falls. What can I do on the outside of my home? Regularly fix the edges of walkways and driveways and fix any cracks. Remove anything that might make you trip as you walk through a door, such as a raised step or threshold. Trim any bushes or trees on the path to your home. Use bright outdoor lighting. Clear any walking paths of anything that might make someone trip, such as rocks or tools. Regularly check to see if handrails are loose or broken. Make sure that both sides of any steps have handrails. Any raised decks and porches should have guardrails on the edges. Have any leaves, snow, or ice cleared regularly. Use sand or salt on walking paths during winter. Clean up any spills in your garage right away. This includes oil or grease spills. What can I do in the bathroom? Use night lights. Install grab bars by the toilet and in the tub and shower. Do not use towel bars as grab bars. Use non-skid mats or decals in the tub or shower. If you need to sit down in the shower, use a plastic, non-slip stool. Keep the floor dry. Clean up any water that spills on the floor as soon as it happens. Remove soap  buildup in the tub or shower regularly. Attach bath mats securely with double-sided non-slip rug tape. Do not have throw rugs and other things on the floor that can make you trip. What can I do in the bedroom? Use night lights. Make sure that you have a light by your bed that is easy to reach. Do not use any sheets or blankets that are too big for your bed. They should not hang down onto the floor. Have a firm chair that has side arms. You can use this for support while you get dressed. Do not have throw rugs and other things on the floor that can make you trip. What can I do in the kitchen? Clean up any spills right away. Avoid walking on wet floors. Keep items that you use a lot in easy-to-reach places. If you need to reach something above you, use a strong step stool that has a grab bar. Keep electrical cords out of the way. Do not use floor polish or wax that makes floors slippery. If you must use wax, use non-skid floor wax. Do not have throw rugs and other things on the floor that can make you trip. What can I do with my stairs? Do not leave any items on the stairs. Make sure that there are handrails on both sides of the stairs and use them. Fix handrails that are broken or loose. Make sure  that handrails are as long as the stairways. Check any carpeting to make sure that it is firmly attached to the stairs. Fix any carpet that is loose or worn. Avoid having throw rugs at the top or bottom of the stairs. If you do have throw rugs, attach them to the floor with carpet tape. Make sure that you have a light switch at the top of the stairs and the bottom of the stairs. If you do not have them, ask someone to add them for you. What else can I do to help prevent falls? Wear shoes that: Do not have high heels. Have rubber bottoms. Are comfortable and fit you well. Are closed at the toe. Do not wear sandals. If you use a stepladder: Make sure that it is fully opened. Do not climb a closed  stepladder. Make sure that both sides of the stepladder are locked into place. Ask someone to hold it for you, if possible. Clearly mark and make sure that you can see: Any grab bars or handrails. First and last steps. Where the edge of each step is. Use tools that help you move around (mobility aids) if they are needed. These include: Canes. Walkers. Scooters. Crutches. Turn on the lights when you go into a dark area. Replace any light bulbs as soon as they burn out. Set up your furniture so you have a clear path. Avoid moving your furniture around. If any of your floors are uneven, fix them. If there are any pets around you, be aware of where they are. Review your medicines with your doctor. Some medicines can make you feel dizzy. This can increase your chance of falling. Ask your doctor what other things that you can do to help prevent falls. This information is not intended to replace advice given to you by your health care provider. Make sure you discuss any questions you have with your health care provider. Document Released: 11/23/2008 Document Revised: 07/05/2015 Document Reviewed: 03/03/2014 Elsevier Interactive Patient Education  2017 [employer].

## 2021-09-06 NOTE — Progress Notes (Addendum)
Virtual Visit via Telephone Note  I connected with  [name] on 09/06/21 at 10:00 AM EDT by telephone and verified that I am speaking with the correct person using two identifiers.  Medicare Annual Wellness visit completed telephonically due to Covid-19 pandemic.   Persons participating in this call: This Health Coach and this patient.   Location: Patient: home Provider: office    I discussed the limitations, risks, security and privacy concerns of performing an evaluation and management service by telephone and the availability of in person appointments. The patient expressed understanding and agreed to proceed.  Unable to perform video visit due to video visit attempted and failed and/or patient does not have video capability.   Some vital signs may be absent or patient reported.   Joseph Brace, LPN   Subjective:   Joseph Malone is a 88 y.o. male who presents for Medicare Annual/Subsequent preventive examination.  Review of Systems     Cardiac Risk Factors include: advanced age (>18men, >48 women);dyslipidemia;diabetes mellitus;hypertension;male gender;obesity (BMI >30kg/m2)     Objective:    There were no vitals filed for this visit. There is no height or weight on file to calculate BMI.     09/06/2021   10:09 AM 09/03/2020    1:16 PM 08/29/2019    1:21 PM 10/21/2018    9:01 AM 10/01/2018    2:06 PM 06/12/2017    9:26 AM 06/01/2013    2:00 PM  Advanced Directives  Does Patient Have a Medical Advance Directive? No No Yes No No Yes Patient does not have advance directive;Patient would like information  Type of Advance Directive      [place];Living will Living will  Does patient want to make changes to medical advance directive?   Yes (MAU/Ambulatory/Procedural Areas - Information given)   No - Patient declined   Copy of [place] in Chart?      No - copy requested   Would patient like information on creating a medical advance  directive? Yes (MAU/Ambulatory/Procedural Areas - Information given) Yes (MAU/Ambulatory/Procedural Areas - Information given)  No - Patient declined   Advance directive packet given    Current Medications (verified) Outpatient Encounter Medications as of 09/06/2021  Medication Sig   acetaminophen (TYLENOL) 500 MG tablet Take 500 mg by mouth at bedtime.    aspirin 81 MG tablet Take 1 tablet (81 mg total) by mouth every evening.   Blood Glucose Monitoring Suppl (ONE TOUCH ULTRA 2) w/Device KIT Use to test blood sugars daily. Dx: E11.9   carvedilol (COREG) 6.25 MG tablet Take 1 tablet (6.25 mg total) by mouth 2 (two) times daily with a meal.   Cholecalciferol (VITAMIN D3) 2000 UNITS TABS Take 2,000 Units by mouth daily.   folic acid (FOLVITE) 469 MCG tablet Take 400 mcg by mouth daily.   furosemide (LASIX) 40 MG tablet Take 1 tablet (40 mg total) by mouth daily as needed. Please call for office visit [phone]   Glucosamine HCl 1500 MG TABS Take 1,500 mg by mouth every evening.    glucose blood test strip Use to test blood sugars daily. Dx: E11.9   Lancets (ONETOUCH DELICA PLUS [id number]) MISC Use to test blood sugars daily. Dx: E11.9   losartan (COZAAR) 25 MG tablet Take 1 tablet (25 mg total) by mouth daily. Please schedule an appointment for further refills   Melatonin 5 MG CHEW Chew by mouth.   metFORMIN (GLUCOPHAGE-XR) 500 MG 24 hr tablet  TAKE 1 TABLET(500 MG) BY MOUTH DAILY WITH BREAKFAST   Multiple Vitamin (MULTIVITAMIN WITH MINERALS) TABS Take 1 tablet by mouth daily.    OneTouch Delica Lancets 74Y MISC USE TO TEST BLOOD SUGAR DAILY   potassium chloride SA (KLOR-CON M) 20 MEQ tablet TAKE 1 TABLET BY MOUTH DAILY, GENERIC EQUIVALENT FOR KLOR-CON.   simvastatin (ZOCOR) 40 MG tablet Take 0.5 tablets (20 mg total) by mouth at bedtime.   spironolactone (ALDACTONE) 25 MG tablet Take 0.5 tablets (12.5 mg total) by mouth daily as needed. (Patient taking differently: Take 12.5 mg by mouth as  needed (pt stated).)   tamsulosin (FLOMAX) 0.4 MG CAPS capsule Take 0.4 mg by mouth at bedtime.   tiZANidine (ZANAFLEX) 2 MG tablet TAKE 1 TABLET(2 MG) BY MOUTH DAILY AS NEEDED FOR MUSCLE SPASMS. DO NOT DRIVE FOR 8 HOURS AFTER USE   warfarin (COUMADIN) 5 MG tablet TAKE 1/2 TABLET DAILY BY MOUTH EXCEPT TAKE 1 TABLET ON MONDAYS, WEDNESDAYS AND FRIDAYS OR AS DIRECTED BY ANTICOAGULATION CLINIC   mupirocin ointment (BACTROBAN) 2 % Apply topically 2 (two) times daily as needed. (Patient not taking: Reported on 09/06/2021)   triamcinolone cream (KENALOG) 0.1 % SMARTSIG:1 Application Topical 2-3 Times Daily (Patient not taking: Reported on 09/06/2021)   No facility-administered encounter medications on file as of 09/06/2021.    Allergies (verified) Penicillins   History: Past Medical History:  Diagnosis Date   Anxiety    BPH (benign prostatic hypertrophy)    CAD (coronary artery disease)    s/p CABG   CHF (congestive heart failure) (HCC)    Diverticulosis    Diverticulosis of colon without hemorrhage 10/22/2007   Qualifier: Diagnosis of  By: Linna Darner MD, [name]     DOE (dyspnea on exertion)    Excess weight    Fasting hyperglycemia    Generalized weakness    GERD (gastroesophageal reflux disease)    Heart attack ([place]) 2001   Heart failure    CHF due to ischemic CM   History of kidney stones    Hyperlipidemia    Hypertension    Ischemic cardiomyopathy    Microscopic hematuria    [facility]   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   Past Surgical History:  Procedure Laterality Date   APPENDECTOMY     BIOPSY  10/21/2018   Procedure: BIOPSY;  Surgeon: Yetta Flock, MD;  Location: [facility];  Service: Gastroenterology;;   [place]  2005   [employer]; s/p ICD gen change and RV lead revision April 2015   CATARACT EXTRACTION  2008   bilateral with lens implant   COLONOSCOPY  2004   Tics; [facility]   CORONARY  ARTERY BYPASS GRAFT  2000   1 vessel   CYSTOSCOPY  06/2012   Dr [name]   ESOPHAGOGASTRODUODENOSCOPY (EGD) WITH PROPOFOL N/A 10/21/2018   Procedure: ESOPHAGOGASTRODUODENOSCOPY (EGD) WITH PROPOFOL;  Surgeon: Yetta Flock, MD;  Location: [facility];  Service: Gastroenterology;  Laterality: N/A;   HOT HEMOSTASIS N/A 10/21/2018   Procedure: HOT HEMOSTASIS (ARGON PLASMA COAGULATION/BICAP);  Surgeon: Yetta Flock, MD;  Location: [facility];  Service: Gastroenterology;  Laterality: N/A;   IMPLANTABLE CARDIOVERTER DEFIBRILLATOR (ICD) GENERATOR CHANGE N/A 06/01/2013   Procedure: ICD GENERATOR CHANGE;  Surgeon: [name], MD;  Location: [facility] CATH LAB;  Service: Cardiovascular;  Laterality: N/A;   LEAD REVISION N/A 06/01/2013   Procedure: LEAD REVISION;  Surgeon: [name], MD;  Location:  [place] CATH LAB;  Service: Cardiovascular;  Laterality: N/A;   PILONIDAL CYST EXCISION     RIGHT/LEFT HEART CATH AND CORONARY/GRAFT ANGIOGRAPHY N/A 06/12/2017   Procedure: RIGHT/LEFT HEART CATH AND CORONARY/GRAFT ANGIOGRAPHY;  Surgeon: [name], MD;  Location: [place] CV LAB;  Service: Cardiovascular;  Laterality: N/A;   TRANSTHORACIC ECHOCARDIOGRAM  08/29/2005   Family History  Problem Relation Age of Onset   Hypertension Father    Heart attack Father 98   Diabetes Brother        X 2   Nephrolithiasis Brother    Heart attack Brother 87   Heart attack Paternal Uncle 39   Sudden death Paternal Grandfather 38       ? heat stroke   Lung cancer Sister        "Asian lung cancer"   Stroke Neg Hx    Colon cancer Neg Hx    Esophageal cancer Neg Hx    Rectal cancer Neg Hx    Stomach cancer Neg Hx    Social History   Socioeconomic History   Marital status: Married    Spouse name: Not on file   Number of children: Not on file   Years of education: Not on file   Highest education level: Not on file  Occupational History   Occupation: Retired  Tobacco Use   Smoking status:  Never    Passive exposure: Yes   Smokeless tobacco: Never  Vaping Use   Vaping Use: Never used  Substance and Sexual Activity   Alcohol use: No   Drug use: No   Sexual activity: Never  Other Topics Concern   Not on file  Social History Narrative   Married 1957. 4 children 2 boys 2 girls. 15 grandkids.  4 greatgrandkids.       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       Hobbies: genealogy, travel      No HCPOA-advised to do this.    Social Determinants of Health   Financial Resource Strain: Low Risk  (09/06/2021)   Overall Financial Resource Strain (CARDIA)    Difficulty of Paying Living Expenses: Not hard at all  Food Insecurity: No Food Insecurity (09/06/2021)   Hunger Vital Sign    Worried About Running Out of Food in the Last Year: Never true    Ran Out of Food in the Last Year: Never true  Transportation Needs: No Transportation Needs (09/06/2021)   PRAPARE - Hydrologist (Medical): No    Lack of Transportation (Non-Medical): No  Physical Activity: Sufficiently Active (09/06/2021)   Exercise Vital Sign    Days of Exercise per Week: 3 days    Minutes of Exercise per Session: 50 min  Stress: No Stress Concern Present (09/06/2021)   [place]    Feeling of Stress : Not at all  Social Connections: [place] (09/06/2021)   Social Connection and Isolation Panel [NHANES]    Frequency of Communication with Friends and Family: More than three times a week    Frequency of Social Gatherings with Friends and Family: More than three times a week    Attends Religious Services: More than 4 times per year    Active Member of [employer] or Organizations: Yes    Attends Archivist Meetings: 1 to 4 times per year    Marital Status: Married    Tobacco Counseling Counseling  given: Not Answered   Clinical Intake:  Pre-visit  preparation completed: Yes  Pain : No/denies pain     BMI - recorded: 31.95 Nutritional Status: BMI > 30  Obese Nutritional Risks: None Diabetes: Yes CBG done?: Yes (pt stated 113) CBG resulted in Enter/ Edit results?: No Did pt. bring in CBG monitor from home?: No  How often do you need to have someone help you when you read instructions, pamphlets, or other written materials from your doctor or pharmacy?: 1 - Never  Diabetic?no  Interpreter Needed?: No  Information entered by :: Charlott Rakes, LPN   Activities of Daily Living    09/06/2021   10:10 AM  In your present state of health, do you have any difficulty performing the following activities:  Hearing? 1  Comment wears hearing aid  Vision? 0  Difficulty concentrating or making decisions? 0  Walking or climbing stairs? 0  Dressing or bathing? 0  Doing errands, shopping? 0  Preparing Food and eating ? N  Using the Toilet? N  In the past six months, have you accidently leaked urine? N  Do you have problems with loss of bowel control? Y  Comment at times  Managing your Medications? N  Managing your Finances? N  Housekeeping or managing your Housekeeping? N    Patient Care Team: Marin Olp, MD as PCP - General (Family Medicine) [name], MD as PCP - Electrophysiology (Cardiology) Bensimhon, Shaune Pascal, MD as PCP - Advanced Heart Failure (Cardiology) Festus Aloe, MD as Consulting Physician (Urology) Allyn Kenner, MD (Dermatology) Edythe Clarity, [facility] as Pharmacist (Pharmacist)  Indicate any recent Medical Services you may have received from other than [facility] providers in the past year (date may be approximate).     Assessment:   This is a routine wellness examination for [place].  Hearing/Vision screen Hearing Screening - Comments:: Pt has  hearing aids  Vision Screening - Comments:: Pt follows up with Dr Wynetta Emery for annual eye exams   Dietary issues and exercise activities  discussed: Current Exercise Habits: Home exercise routine, Type of exercise: walking;Other - see comments (recumbent bike), Time (Minutes): 50, Frequency (Times/Week): 3, Weekly Exercise (Minutes/Week): 150   Goals Addressed             This Visit's Progress    Patient Stated       Patient Stated       Keep blood sugar down and eating healthier        Depression Screen    09/06/2021   10:07 AM 11/23/2020    3:00 PM 09/03/2020    1:12 PM 06/26/2020    2:59 PM 02/28/2020    1:13 PM 08/29/2019    1:16 PM 08/26/2019    1:12 PM  PHQ 2/9 Scores  PHQ - 2 Score 0 0 0 0 0 2 3  PHQ- 9 Score    0  4 7    Fall Risk    09/06/2021   10:10 AM 11/23/2020    3:00 PM 09/03/2020    1:18 PM 06/26/2020    2:59 PM 08/29/2019    1:22 PM  [place] in the past year? 0 0 1 1 0  Number falls in past yr: 0 0 1 0 0  Injury with Fall? 0 0 1 1 0  Comment   skinned knees    Risk for fall due to : Impaired vision;Impaired balance/gait No Fall Risks Impaired balance/gait;Impaired mobility;Impaired vision  Impaired mobility;Impaired  balance/gait  Follow up Falls prevention discussed Falls evaluation completed Falls prevention discussed  Falls prevention discussed    FALL RISK PREVENTION PERTAINING TO THE HOME:  Any stairs in or around the home? No  If so, are there any without handrails? No  Home free of loose throw rugs in walkways, pet beds, electrical cords, etc? Yes  Adequate lighting in your home to reduce risk of falls? Yes   ASSISTIVE DEVICES UTILIZED TO PREVENT FALLS:  Life alert? No  Use of a cane, walker or w/c? No  Grab bars in the bathroom? No  Shower chair or bench in shower? No  Elevated toilet seat or a handicapped toilet? No   TIMED UP AND GO:  Was the test performed? No .   Cognitive Function:        09/06/2021   10:12 AM 09/03/2020    1:21 PM 08/29/2019    1:28 PM  6CIT Screen  What Year? 0 points 0 points 0 points  What month? 0 points 0 points 0 points  What  time? 0 points 0 points 0 points  Count back from 20 0 points 0 points 0 points  Months in reverse 0 points 0 points 0 points  Repeat phrase 2 points 2 points 4 points  Total Score 2 points 2 points 4 points    Immunizations Immunization History  Administered Date(s) Administered   Fluad Quad(high Dose 65+) 01/20/2020   Influenza, High Dose Seasonal PF 10/12/2018, 10/24/2020   Influenza,inj,Quad PF,6+ Mos 11/11/2012   Influenza-Unspecified 10/25/2013, 10/27/2014, 10/24/2015, 10/19/2017   PFIZER(Purple Top)SARS-COV-2 Vaccination 03/06/2019, 03/24/2019, 01/20/2020, 06/27/2020   PNEUMOCOCCAL CONJUGATE-20 09/06/2020   Pneumococcal Conjugate-13 08/21/2014   Pneumococcal Polysaccharide-23 10/02/2009   Td 09/17/2009, 10/31/2019   Zoster Recombinat (Shingrix) 06/27/2020, 08/27/2020   Zoster, Live 12/29/2012    TDAP status: Up to date  Flu Vaccine status: Up to date  Pneumococcal vaccine status: Up to date  Covid-19 vaccine status: Completed vaccines  Qualifies for Shingles Vaccine? Yes   Zostavax completed Yes   Shingrix Completed?: Yes  Screening Tests Health Maintenance  Topic Date Due   COVID-19 Vaccine (5 - Booster for Pfizer series) 08/22/2020   OPHTHALMOLOGY EXAM  08/10/2021   FOOT EXAM  09/06/2021   HEMOGLOBIN A1C  09/08/2021   INFLUENZA VACCINE  09/10/2021   TETANUS/TDAP  10/30/2029   Pneumonia Vaccine 68+ Years old  Completed   Zoster Vaccines- Shingrix  Completed   HPV VACCINES  Aged Out    Health Maintenance  Health Maintenance Due  Topic Date Due   COVID-19 Vaccine (5 - Booster for Pfizer series) 08/22/2020   OPHTHALMOLOGY EXAM  08/10/2021   FOOT EXAM  09/06/2021    Colorectal cancer screening: No longer required.   Additional Screening:   Vision Screening: Recommended annual ophthalmology exams for early detection of glaucoma and other disorders of the eye. Is the patient up to date with their annual eye exam?  Yes  Who is the provider or what is  the name of the office in which the patient attends annual eye exams? Dr Wynetta Emery  If pt is not established with a provider, would they like to be referred to a provider to establish care? No .   Dental Screening: Recommended annual dental exams for proper oral hygiene  Community Resource Referral / Chronic Care Management: CRR required this visit?  No   CCM required this visit?  No      Plan:     I have personally reviewed  and noted the following in the patient's chart:   Medical and social history Use of alcohol, tobacco or illicit drugs  Current medications and supplements including opioid prescriptions. Patient is not currently taking opioid prescriptions. Functional ability and status Nutritional status Physical activity Advanced directives List of other physicians Hospitalizations, surgeries, and ER visits in previous 12 months Vitals Screenings to include cognitive, depression, and falls Referrals and appointments  In addition, I have reviewed and discussed with patient certain preventive protocols, quality metrics, and best practice recommendations. A written personalized care plan for preventive services as well as general preventive health recommendations were provided to patient.     Joseph Brace, LPN   07/16/7701   Nurse Notes: pt stated he wants to discuss medication clarification for spironolactone and to discuss if he needs further boosters for covid on Mondays appt 09/09/21 @ 11.

## 2021-09-09 ENCOUNTER — Ambulatory Visit (INDEPENDENT_AMBULATORY_CARE_PROVIDER_SITE_OTHER): Payer: Medicare Other | Admitting: Family Medicine

## 2021-09-09 ENCOUNTER — Encounter: Payer: Self-pay | Admitting: Family Medicine

## 2021-09-09 VITALS — BP 120/80 | HR 91 | Temp 97.8°F | Ht 71.0 in | Wt 227.0 lb

## 2021-09-09 DIAGNOSIS — E1169 Type 2 diabetes mellitus with other specified complication: Secondary | ICD-10-CM

## 2021-09-09 DIAGNOSIS — E785 Hyperlipidemia, unspecified: Secondary | ICD-10-CM

## 2021-09-09 DIAGNOSIS — I10 Essential (primary) hypertension: Secondary | ICD-10-CM

## 2021-09-09 DIAGNOSIS — E1151 Type 2 diabetes mellitus with diabetic peripheral angiopathy without gangrene: Secondary | ICD-10-CM

## 2021-09-09 DIAGNOSIS — Z0000 Encounter for general adult medical examination without abnormal findings: Secondary | ICD-10-CM | POA: Diagnosis not present

## 2021-09-09 DIAGNOSIS — I5022 Chronic systolic (congestive) heart failure: Secondary | ICD-10-CM

## 2021-09-09 DIAGNOSIS — I2510 Atherosclerotic heart disease of native coronary artery without angina pectoris: Secondary | ICD-10-CM

## 2021-09-09 DIAGNOSIS — Z79899 Other long term (current) drug therapy: Secondary | ICD-10-CM | POA: Diagnosis not present

## 2021-09-09 LAB — LIPID PANEL
Cholesterol: 146 mg/dL (ref 0–200)
HDL: 47.2 mg/dL (ref >39.00)
LDL Cholesterol: 71 mg/dL (ref 0–99)
NonHDL: 98.54
Total CHOL/HDL Ratio: 3
Triglycerides: 136 mg/dL (ref 0.0–149.0)
VLDL: 27.2 mg/dL (ref 0.0–40.0)

## 2021-09-09 LAB — CBC WITH DIFFERENTIAL/PLATELET
Basophils Absolute: 0.1 10*3/uL (ref 0.0–0.1)
Basophils Relative: 0.8 % (ref 0.0–3.0)
Eosinophils Absolute: 0.2 10*3/uL (ref 0.0–0.7)
Eosinophils Relative: 2.1 % (ref 0.0–5.0)
HCT: 42.8 % (ref 39.0–52.0)
Hemoglobin: 14.5 g/dL (ref 13.0–17.0)
Lymphocytes Relative: 38.1 % (ref 12.0–46.0)
Lymphs Abs: 2.9 10*3/uL (ref 0.7–4.0)
MCHC: 33.9 g/dL (ref 30.0–36.0)
MCV: 93.2 fl (ref 78.0–100.0)
Monocytes Absolute: 0.6 10*3/uL (ref 0.1–1.0)
Monocytes Relative: 7.4 % (ref 3.0–12.0)
Neutro Abs: 4 10*3/uL (ref 1.4–7.7)
Neutrophils Relative %: 51.6 % (ref 43.0–77.0)
Platelets: 151 10*3/uL (ref 150.0–400.0)
RBC: 4.59 Mil/uL (ref 4.22–5.81)
RDW: 13.3 % (ref 11.5–15.5)
WBC: 7.7 10*3/uL (ref 4.0–10.5)

## 2021-09-09 LAB — COMPREHENSIVE METABOLIC PANEL
ALT: 19 U/L (ref 0–53)
AST: 21 U/L (ref 0–37)
Albumin: 4.3 g/dL (ref 3.5–5.2)
Alkaline Phosphatase: 47 U/L (ref 39–117)
BUN: 27 mg/dL — ABNORMAL HIGH (ref 6–23)
CO2: 22 mEq/L (ref 19–32)
Calcium: 9.5 mg/dL (ref 8.4–10.5)
Chloride: 105 mEq/L (ref 96–112)
Creatinine, Ser: 1.4 mg/dL (ref 0.40–1.50)
GFR: 44.54 mL/min — ABNORMAL LOW (ref >60.00)
Glucose, Bld: 127 mg/dL — ABNORMAL HIGH (ref 70–99)
Potassium: 4.2 mEq/L (ref 3.5–5.1)
Sodium: 138 mEq/L (ref 135–145)
Total Bilirubin: 0.5 mg/dL (ref 0.2–1.2)
Total Protein: 7.5 g/dL (ref 6.0–8.3)

## 2021-09-09 LAB — VITAMIN B12: Vitamin B-12: 825 pg/mL (ref 211–911)

## 2021-09-09 LAB — HEMOGLOBIN A1C: Hgb A1c MFr Bld: 6.4 % (ref 4.6–6.5)

## 2021-09-09 MED ORDER — SPIRONOLACTONE 25 MG PO TABS
12.5000 mg | ORAL_TABLET | Freq: Every day | ORAL | 3 refills | Status: DC
Start: 2021-09-09 — End: 2022-12-11

## 2021-09-09 NOTE — Patient Instructions (Addendum)
Please stop by lab before you go If you have mychart- we will send your results within 3 business days of [place] receiving them.  If you do not have mychart- we will call you about results within 5 business days of [place] receiving them.  *please also note that you will see labs on mychart as soon as they post. I will later go in and write notes on them- will say "notes from Dr. Yong Channel"    Hold potassium for now -start spironolactone daily 12.5 mg only- half tablet -once we get labs back strongly considering checking potassium and  kidney function in 2 weeks to see where things stand  Recommended follow up: Return in about 6 months (around 03/12/2022) for followup or sooner if needed.Schedule b4 you leave.

## 2021-09-09 NOTE — Progress Notes (Signed)
Phone: [phone]   Subjective:  Patient presents today for their annual physical. Chief complaint-noted.   See problem oriented charting- ROS- full  review of systems was completed and negative  except for: back pain, rash that came up onto ankles- lamisil OTC helpful, , no worsening chest pain-stable angina, shortness of breath slightly better with 2-3x a week exercises, fatigue  The following were reviewed and entered/updated in epic: Past Medical History:  Diagnosis Date   Anxiety    BPH (benign prostatic hypertrophy)    CAD (coronary artery disease)    s/p CABG   CHF (congestive heart failure) ([place])    Diverticulosis    Diverticulosis of colon without hemorrhage 10/22/2007   Qualifier: Diagnosis of  By: Linna Darner MD, [name]     DOE (dyspnea on exertion)    Excess weight    Fasting hyperglycemia    Generalized weakness    GERD (gastroesophageal reflux disease)    Heart attack ([place]) 2001   Heart failure    CHF due to ischemic CM   History of kidney stones    Hyperlipidemia    Hypertension    Ischemic cardiomyopathy    Microscopic hematuria    [facility]   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) 05/18/2020    Priority: Medium    Nephrolithiasis 03/24/2016    Priority: Medium    Hyperlipidemia associated with type 2 diabetes mellitus ([place]) 04/22/2007    Priority: Medium    Essential hypertension 09/25/2006    Priority: Medium    Long term (current) use of anticoagulants 07/08/2017     Priority: Low   Erectile dysfunction 01/19/2014    Priority: Low   Basal cell cancer 09/29/2012    Priority: Low   Solitary pulmonary nodule 07/12/2012    Priority: Low   BPH without obstruction/lower urinary tract symptoms 10/22/2007    Priority: Low   Osteoarthritis 09/25/2006    Priority: Low   Hematuria 09/25/2006    Priority: Low   PVC's (premature ventricular contractions) 01/24/2020   Melena    Past Surgical History:  Procedure Laterality Date   APPENDECTOMY     BIOPSY  10/21/2018   Procedure: BIOPSY;  Surgeon: Yetta Flock, MD;  Location: [facility];  Service: Gastroenterology;;   CARDIAC CATHETERIZATION  2000   CARDIAC DEFIBRILLATOR PLACEMENT  2005   [employer]; s/p ICD gen change and RV lead revision April 2015   CATARACT EXTRACTION  2008   bilateral with lens implant   COLONOSCOPY  2004   Tics; [facility]   CORONARY ARTERY BYPASS GRAFT  2000   1 vessel   CYSTOSCOPY  06/2012   Dr [name]   ESOPHAGOGASTRODUODENOSCOPY (EGD) WITH PROPOFOL N/A 10/21/2018   Procedure: ESOPHAGOGASTRODUODENOSCOPY (EGD) WITH PROPOFOL;  Surgeon: Yetta Flock, MD;  Location: [facility];  Service: Gastroenterology;  Laterality: N/A;   HOT HEMOSTASIS N/A 10/21/2018   Procedure: HOT HEMOSTASIS (ARGON PLASMA COAGULATION/BICAP);  Surgeon: Yetta Flock, MD;  Location: [facility];  Service: Gastroenterology;  Laterality: N/A;   IMPLANTABLE CARDIOVERTER DEFIBRILLATOR (ICD) GENERATOR CHANGE N/A 06/01/2013   Procedure: ICD GENERATOR CHANGE;  Surgeon: [name], MD;  Location: [facility] CATH LAB;  Service: Cardiovascular;  Laterality: N/A;   LEAD REVISION N/A 06/01/2013   Procedure: LEAD REVISION;  Surgeon: [name], MD;  Location: [facility] CATH LAB;  Service: Cardiovascular;  Laterality: N/A;   PILONIDAL CYST EXCISION     RIGHT/LEFT HEART CATH AND CORONARY/GRAFT ANGIOGRAPHY N/A 06/12/2017   Procedure: RIGHT/LEFT HEART CATH AND CORONARY/GRAFT ANGIOGRAPHY;  Surgeon: [name], MD;  Location: [facility];  Service: Cardiovascular;  Laterality: N/A;   TRANSTHORACIC ECHOCARDIOGRAM  08/29/2005    Family History  Problem Relation Age of Onset   Hypertension Father    Heart attack Father 22   Diabetes Brother        X 2   Nephrolithiasis Brother    Heart attack Brother 64   Heart attack Paternal Uncle 50   Sudden death Paternal Grandfather 83       ? heat stroke   Lung cancer Sister        "Asian lung cancer"   Stroke Neg Hx    Colon cancer Neg Hx    Esophageal cancer Neg Hx    Rectal cancer Neg Hx    Stomach cancer Neg Hx     Medications- reviewed and updated Current Outpatient Medications  Medication Sig Dispense Refill   acetaminophen (TYLENOL) 500 MG tablet Take 500 mg by mouth at bedtime.      aspirin 81 MG tablet Take 1 tablet (81 mg total) by mouth every evening.     Blood Glucose Monitoring Suppl (ONE TOUCH ULTRA 2) w/Device KIT Use to test blood sugars daily. Dx: E11.9 1 kit 3   carvedilol (COREG) 6.25 MG tablet Take 1 tablet (6.25 mg total) by mouth 2 (two) times daily with a meal. 180 tablet 3   Cholecalciferol (VITAMIN D3) 2000 UNITS TABS Take 2,000 Units by mouth daily.     folic acid (FOLVITE) 301 MCG tablet Take 400 mcg by mouth daily.     furosemide (LASIX) 40 MG tablet Take 1 tablet (40 mg total) by mouth daily as needed. Please call for office visit [phone] 30 tablet 3   Glucosamine HCl 1500 MG TABS Take 1,500 mg by mouth every evening.      glucose blood test strip Use to test blood sugars daily. Dx: E11.9 100 each 12   Lancets (ONETOUCH DELICA PLUS [id number]) MISC Use to test blood sugars daily. Dx: E11.9 100 each 3   losartan (COZAAR) 25 MG tablet Take 1 tablet (25 mg total) by mouth daily. Please schedule an appointment for further refills 30 tablet 3   Melatonin 5 MG CHEW Chew by mouth.     metFORMIN (GLUCOPHAGE-XR) 500 MG 24 hr tablet TAKE 1 TABLET(500 MG) BY MOUTH DAILY WITH BREAKFAST 30 tablet 5   Multiple Vitamin  (MULTIVITAMIN WITH MINERALS) TABS Take 1 tablet by mouth daily.      mupirocin ointment (BACTROBAN) 2 % Apply topically 2 (two) times daily as needed. (Patient not taking: Reported on 09/06/2021)     OneTouch Delica Lancets 70W MISC USE TO TEST BLOOD SUGAR DAILY 100 each 3   potassium chloride SA (KLOR-CON M) 20 MEQ tablet TAKE 1 TABLET BY MOUTH DAILY, GENERIC EQUIVALENT FOR KLOR-CON. 90 tablet 3   simvastatin (ZOCOR) 40 MG tablet Take 0.5 tablets (20 mg total) by mouth at bedtime. 45 tablet 3  spironolactone (ALDACTONE) 25 MG tablet Take 0.5 tablets (12.5 mg total) by mouth daily. 90 tablet 3   tamsulosin (FLOMAX) 0.4 MG CAPS capsule Take 0.4 mg by mouth at bedtime.     tiZANidine (ZANAFLEX) 2 MG tablet TAKE 1 TABLET(2 MG) BY MOUTH DAILY AS NEEDED FOR MUSCLE SPASMS. DO NOT DRIVE FOR 8 HOURS AFTER USE 90 tablet 0   triamcinolone cream (KENALOG) 0.1 % SMARTSIG:1 Application Topical 2-3 Times Daily (Patient not taking: Reported on 09/06/2021)     warfarin (COUMADIN) 5 MG tablet TAKE 1/2 TABLET DAILY BY MOUTH EXCEPT TAKE 1 TABLET ON MONDAYS, WEDNESDAYS AND FRIDAYS OR AS DIRECTED BY ANTICOAGULATION CLINIC 100 tablet 1   No current facility-administered medications for this visit.    Allergies-reviewed and updated Allergies  Allergen Reactions   Penicillins Rash and Other (See Comments)    Because of a history of documented adverse serious drug reaction;Medi Alert bracelet  is recommended Has patient had a PCN reaction causing immediate rash, facial/tongue/throat swelling, SOB or lightheadedness with hypotension: No Has patient had a PCN reaction causing severe rash involving mucus membranes or skin necrosis: No Has patient had a PCN reaction that required hospitalization: No Has patient had a PCN reaction occurring within the last 10 years: No If all of the above answers are "NO", t    Social History   Social History Narrative   Married 1957. 4 children 2 boys 2 girls. 15 grandkids.  4  greatgrandkids.       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       Hobbies: genealogy, travel      No HCPOA-advised to do this.    Objective  Objective:  BP 120/80   Pulse 91   Temp 97.8 F (36.6 C)   Ht _0  (1.803 m)   Wt 227 lb (103 kg)   SpO2 96%   BMI 31.66 kg/m  Gen: NAD, resting comfortably HEENT: Mucous membranes are moist. Oropharynx normal Neck: no thyromegaly CV: RRR no murmurs rubs or gallops Lungs: CTAB no crackles, wheeze, rhonchi Abdomen: soft/nontender/nondistended/normal bowel sounds. No rebound or guarding.  Ext: trace edema Skin: warm, dry Neuro: grossly normal, moves all extremities, PERRLA  Diabetic Foot Exam - Simple   Simple Foot Form Visual Inspection No deformities, no ulcerations, no other skin breakdown bilaterally: Yes Sensation Testing Intact to touch and monofilament testing bilaterally: Yes Pulse Check Posterior Tibialis and Dorsalis pulse intact bilaterally: Yes Comments Onychomycosis noted       Assessment and Plan  88 y.o. male presenting for annual physical.  Health Maintenance counseling: 1. Anticipatory guidance: Patient counseled regarding regular dental exams -q6 months, eye exams -yearly,  avoiding smoking and second hand smoke , limiting alcohol to 2 beverages per day , no illicit drugs .   2. Risk factor reduction:  Advised patient of need for regular exercise and diet rich and fruits and vegetables to reduce risk of heart attack and stroke.  Exercise- 2-3 days a week exercise.  Diet/weight management-low carb diet and eating healthier in general- down 6 lbs from last visit here Wt Readings from Last 3 Encounters:  09/09/21 227 lb (103 kg)  07/05/21 229 lb (103.9 kg)  06/05/21 226 lb 3.2 oz (102.6 kg)   3. Immunizations/screenings/ancillary studies- discussed waiting 6 months from last covid shot and at least until October when new covid shot supposed to be  released Immunization History  Administered Date(s) Administered   [employer]  Dose 65+) 01/20/2020   Influenza, High Dose Seasonal PF 10/12/2018, 10/24/2020   Influenza,inj,Quad PF,6+ Mos 11/11/2012   Influenza-Unspecified 10/25/2013, 10/27/2014, 10/24/2015, 10/19/2017   PFIZER(Purple Top)SARS-COV-2 Vaccination 03/06/2019, 03/24/2019, 01/20/2020, 06/27/2020   PNEUMOCOCCAL CONJUGATE-20 09/06/2020   Pfizer Covid-19 Vaccine Bivalent Booster 79yr & up 12/10/2020, 06/05/2021   Pneumococcal Conjugate-13 08/21/2014   Pneumococcal Polysaccharide-23 10/02/2009   Td 09/17/2009, 10/31/2019   Zoster Recombinat (Shingrix) 06/27/2020, 08/27/2020   Zoster, Live 12/29/2012  4. Prostate cancer screening- past age based screening recommendations  Lab Results  Component Value Date   PSA 0.58 10/22/2007   PSA 0.67 09/25/2006   5. Colon cancer screening -  past age based screening recommendations  6. Skin cancer screening- Dr. HNevada Craneas needed. advised regular sunscreen use. Denies worrisome, changing, or new skin lesions.  7. Smoking associated screening (lung cancer screening, AAA screen 65-75, UA)- never smoker 8. STD screening - not dating/sexually active  Status of chronic or acute concerns   #Heart failure with reduced ejection fraction/ ischemic cardiomypopathy- Dr. BHaroldine LawsS: Medication:Carvedilol 6.25 mg twice daily, Lasix 40 mg as needed- discussed for weight gain or edema- takes about every 2 weeks, losartan 25 mg daily, spironolactone 12.5 mg  (there was some confusion bc at some point rx started reading as needed- but patient and last note with cardiology both confirm daily dosing was preferred- we changed back to daily dosing  Edema: none Weight gain:no- going down Shortness of breath: stable to slightly improved with exercise A/P: CHF appears stable but we will restart spironolactone regularly as per Dr. BClayborne Danalast note- otherwise continue current meds  #CAD with  defibrillator in place/hyperlipidemia #statin myalgia on higher doser #Apical thrombus-on Coumadin through cardiology clinic S: Medication:Aspirin 81 mg, simvastatin 40 mg -no new chest pain- stable angina if pushes himself hard with exercise. Stable shortness o fbreath Lab Results  Component Value Date   CHOL 148 09/06/2020   HDL 42.80 09/06/2020   LDLCALC 80 09/06/2020   LDLDIRECT 79.0 03/11/2021   TRIG 123.0 09/06/2020   CHOLHDL 3 09/06/2020   A/P: CAD asymptomatic- continue current meds. Tolerating statin - LDL has been slightly high but hasnt tolerated statin stronger- continue current meds -will consider zetia if LDL still above 70- he has not tried in the past  #hypertension S: medication: Carvedilol 3.125 mg twice daily,  losartan 25 mg.  Lasix and spironolactone as needed- but see above spironolactone will change back to daily A/P: Controlled. Continue current medications.   # Diabetes S: Medication:Diet controlled in past- doing well on metformin 5013mXR recently CBGs- Home readings have been between 120-150 fasting  A/P: hopefully stable- update a1c today. Continue current meds for now  #Melena history-history of gastric erosions later requiring cauterization-on omeprazole for 3 months in 2020 - now off all rx for GERD- no recurrent issues  #history of pilonidal cyst excision in younger years- mild discomfort in area but exam normal- we will monitor.   Recommended follow up: Return in about 6 months (around 03/12/2022) for followup or sooner if needed.Schedule b4 you leave. Future Appointments  Date Time Provider [place]8/10/2021 10:30 AM [facility] COUMADIN CLINIC [facility]  10/22/2021  2:15 PM [facility] CCM PHARMACIST [facility]  11/01/2021  3:35 PM [facility] DEVICE REMOTES [facility]  11/14/2021  1:00 PM [facility] OP 1 [facility]  2:00 PM Bensimhon, DaShaune PascalMD [facility] None  01/31/2022  3:35 PM [facility] DEVICE REMOTES  [facility]  05/02/2022  3:35  PM [facility] DEVICE REMOTES [facility]  09/12/2022 11:00 AM [facility] HEALTH COACH [facility]   Lab/Order associations: fasting   ICD-10-CM   1. Preventative health care  Z00.00     2. Hyperlipidemia associated with type 2 diabetes mellitus (HCC)  E11.69 CBC with Differential/Platelet   E78.5 Comprehensive metabolic panel    Lipid panel    3. DM (diabetes mellitus) type II, controlled, with peripheral vascular disorder (HCC)  E11.51 HgB A1c    4. Essential hypertension  I10     5. Chronic systolic heart failure (HCC)  I50.22     6. CAD in native artery  I25.10     7. High risk medication use  Z79.899 Vitamin B12      Meds ordered this encounter  Medications   spironolactone (ALDACTONE) 25 MG tablet    Sig: Take 0.5 tablets (12.5 mg total) by mouth daily.    Dispense:  90 tablet    Refill:  3    Return precautions advised.  Garret Reddish, MD

## 2021-09-13 ENCOUNTER — Ambulatory Visit: Payer: Medicare Other

## 2021-09-18 ENCOUNTER — Ambulatory Visit (INDEPENDENT_AMBULATORY_CARE_PROVIDER_SITE_OTHER): Payer: Medicare Other

## 2021-09-18 DIAGNOSIS — Z7901 Long term (current) use of anticoagulants: Secondary | ICD-10-CM

## 2021-09-18 LAB — POCT INR: INR: 1.3 — AB (ref 2.0–3.0)

## 2021-09-18 NOTE — Progress Notes (Signed)
Pt reports he thinks he missed two doses, including yesterdays dose. He reports he has missed his evening meds a lot but never misses his morning meds. Advised to start taking warfarin in the morning but on the day of INR check hold until after apt. Pt verbalized understanding.  Due to pt missing doses, no long term change will be made since there was an increase in dosing at last visit when INR was 1.7. Booster doses for  today and tomorrow.  Increase dose today to take 1 1/2 tablets and increase dose tomorrow to take 1 tablet and the continue 1 tablet daily except 1/2 tablet on Mondays and Thursdays.  Re-check in 1 weeks.

## 2021-09-18 NOTE — Patient Instructions (Addendum)
Pre visit review using our clinic review tool, if applicable. No additional management support is needed unless otherwise documented below in the visit note.  Increase dose today to take 1 1/2 tablets and increase dose tomorrow to take 1 tablet and the continue 1 tablet daily except 1/2 tablet on Mondays and Thursdays.  Re-check in 1 weeks.

## 2021-09-18 NOTE — Progress Notes (Signed)
I have reviewed and agree with note, evaluation, plan.   Paytan Recine, MD  

## 2021-09-25 ENCOUNTER — Ambulatory Visit (INDEPENDENT_AMBULATORY_CARE_PROVIDER_SITE_OTHER): Payer: Medicare Other

## 2021-09-25 DIAGNOSIS — Z7901 Long term (current) use of anticoagulants: Secondary | ICD-10-CM | POA: Diagnosis not present

## 2021-09-25 LAB — POCT INR: INR: 2 (ref 2.0–3.0)

## 2021-09-25 NOTE — Progress Notes (Signed)
Continue 1 tablet daily except 1/2 tablet on Mondays and Thursdays.  Re-check in 3 weeks.

## 2021-09-25 NOTE — Patient Instructions (Addendum)
Pre visit review using our clinic review tool, if applicable. No additional management support is needed unless otherwise documented below in the visit note.  Continue 1 tablet daily except 1/2 tablet on Mondays and Thursdays.  Re-check in 3 weeks.

## 2021-09-25 NOTE — Progress Notes (Signed)
I have reviewed and agree with note, evaluation, plan.   Brennan Karam, MD  

## 2021-09-26 ENCOUNTER — Other Ambulatory Visit: Payer: Self-pay | Admitting: Family Medicine

## 2021-09-28 ENCOUNTER — Other Ambulatory Visit: Payer: Self-pay | Admitting: Family Medicine

## 2021-09-28 DIAGNOSIS — Z7901 Long term (current) use of anticoagulants: Secondary | ICD-10-CM

## 2021-09-30 ENCOUNTER — Other Ambulatory Visit: Payer: Self-pay | Admitting: Family Medicine

## 2021-10-11 ENCOUNTER — Other Ambulatory Visit (HOSPITAL_COMMUNITY): Payer: Self-pay | Admitting: *Deleted

## 2021-10-11 MED ORDER — FUROSEMIDE 40 MG PO TABS: 40.0000 mg | ORAL_TABLET | Freq: Every day | ORAL | 3 refills | Status: DC | PRN

## 2021-10-16 ENCOUNTER — Ambulatory Visit (INDEPENDENT_AMBULATORY_CARE_PROVIDER_SITE_OTHER): Payer: Medicare Other

## 2021-10-16 DIAGNOSIS — Z7901 Long term (current) use of anticoagulants: Secondary | ICD-10-CM

## 2021-10-16 LAB — POCT INR: INR: 3.1 — AB (ref 2.0–3.0)

## 2021-10-16 NOTE — Progress Notes (Signed)
Reduce dose today to take 1/2 tablet and then continue 1 tablet daily except 1/2 tablet on Mondays and Thursdays.  Re-check in 3 weeks.

## 2021-10-16 NOTE — Patient Instructions (Addendum)
Pre visit review using our clinic review tool, if applicable. No additional management support is needed unless otherwise documented below in the visit note.  Reduce dose today to take 1/2 tablet and then continue 1 tablet daily except 1/2 tablet on Mondays and Thursdays.  Re-check in 3 weeks.

## 2021-10-16 NOTE — Progress Notes (Signed)
I have reviewed and agree with note, evaluation, plan.   Jerelle Virden, MD  

## 2021-10-16 NOTE — Progress Notes (Signed)
Chronic Care Management Pharmacy Note  10/21/2021 Name:  Joseph Malone MRN:  [id number] DOB:  06/14/1930  Summary: A1c continues to improve.  He is tolerating metformin better than previously.  LDL improving also with lifestyle mods.  All meds verified and he is adherent.  Recommendations: No changes  FU 6 months   Subjective: Joseph Malone is an 88 y.o. year old male who is a primary patient of Hunter, Brayton Mars, MD.  The CCM team was consulted for assistance with disease management and care coordination needs.    Engaged with patient by telephone for follow up visit in response to provider referral for pharmacy case management and/or care coordination services.   Consent to Services:  The patient was given the following information about Chronic Care Management services today, agreed to services, and gave verbal consent: 1. CCM service includes personalized support from designated clinical staff supervised by the primary care provider, including individualized plan of care and coordination with other care providers 2. 24/7 contact phone numbers for assistance for urgent and routine care needs. 3. Service will only be billed when office clinical staff spend 20 minutes or more in a month to coordinate care. 4. Only one practitioner may furnish and bill the service in a calendar month. 5.The patient may stop CCM services at any time (effective at the end of the month) by phone call to the office staff. 6. The patient will be responsible for cost sharing (co-pay) of up to 20% of the service fee (after annual deductible is met). Patient agreed to services and consent obtained.  Patient Care Team: Marin Olp, MD as PCP - General (Family Medicine) [name], MD as PCP - Electrophysiology (Cardiology) Bensimhon, Shaune Pascal, MD as PCP - Advanced Heart Failure (Cardiology) Festus Aloe, MD as Consulting Physician (Urology) Allyn Kenner, MD (Dermatology) Edythe Clarity, [facility] as  Pharmacist (Pharmacist)  Recent office visits:  02/12/2021 [facility] (PCP) Marin Olp, MD; no medication changes indicated.   01/25/2021 Trudee Kuster, MD; light stretches, icey hot if needed, no medication changes indicated.   Recent consult visits:  None   Hospital visits:  None in previous 6 months     Objective:  Lab Results  Component Value Date   CREATININE 1.40 09/09/2021   BUN 27 (H) 09/09/2021   GFR 44.54 (L) 09/09/2021   GFRNONAA 40 (L) 10/01/2018   GFRAA 46 (L) 10/01/2018   NA 138 09/09/2021   K 4.2 09/09/2021   CALCIUM 9.5 09/09/2021   CO2 22 09/09/2021   GLUCOSE 127 (H) 09/09/2021    Lab Results  Component Value Date/Time   HGBA1C 6.4 09/09/2021 12:06 PM   HGBA1C 6.7 (H) 03/11/2021 11:58 AM   GFR 44.54 (L) 09/09/2021 12:06 PM   GFR 50.72 (L) 03/11/2021 11:58 AM   MICROALBUR 1.3 05/17/2020 10:11 AM   MICROALBUR 1.1 01/11/2016 08:36 AM    Last diabetic Eye exam:  Lab Results  Component Value Date/Time   HMDIABEYEEXA No Retinopathy 08/10/2020 12:00 AM    Last diabetic Foot exam: No results found for: "HMDIABFOOTEX"   Lab Results  Component Value Date   CHOL 146 09/09/2021   HDL 47.20 09/09/2021   LDLCALC 71 09/09/2021   LDLDIRECT 79.0 03/11/2021   TRIG 136.0 09/09/2021   CHOLHDL 3 09/09/2021       Latest Ref Rng & Units 09/09/2021   12:06 PM 03/11/2021   11:58 AM 09/06/2020   11:34 AM  Hepatic Function  Total Protein 6.0 - 8.3 g/dL 7.5  6.8  6.9   Albumin 3.5 - 5.2 g/dL 4.3  4.2  3.9   AST 0 - 37 U/L _0 ALT 0 - 53 U/L _1 Alk Phosphatase 39 - 117 U/L 47  56  54   Total Bilirubin 0.2 - 1.2 mg/dL 0.5  0.6  0.4     Lab Results  Component Value Date/Time   TSH 0.76 05/17/2020 10:11 AM   TSH 0.47 02/28/2020 01:54 PM   FREET4 0.82 10/22/2010 09:34 AM       Latest Ref Rng & Units 09/09/2021   12:06 PM 03/11/2021   11:58 AM 09/06/2020   11:34 AM  CBC  WBC 4.0 - 10.5 K/uL 7.7  8.2  6.7   Hemoglobin 13.0 - 17.0  g/dL 14.5  14.9  13.9   Hematocrit 39.0 - 52.0 % 42.8  44.8  41.5   Platelets 150.0 - 400.0 K/uL 151.0  157.0  167.0     No results found for: "VD25OH"  Clinical ASCVD: Yes  The ASCVD Risk score (Arnett DK, et al., 2019) failed to calculate for the following reasons:   The 2019 ASCVD risk score is only valid for ages 55 to 25       09/06/2021   10:07 AM 11/23/2020    3:00 PM 09/03/2020    1:12 PM  Depression screen PHQ 2/9  Decreased Interest 0 0 0  Down, Depressed, Hopeless 0 0 0  PHQ - 2 Score 0 0 0    Social History   Tobacco Use  Smoking Status Never   Passive exposure: Yes  Smokeless Tobacco Never   BP Readings from Last 3 Encounters:  09/09/21 120/80  07/05/21 104/64  06/05/21 116/60   Pulse Readings from Last 3 Encounters:  09/09/21 91  07/05/21 76  06/05/21 68   Wt Readings from Last 3 Encounters:  09/09/21 227 lb (103 kg)  07/05/21 229 lb (103.9 kg)  06/05/21 226 lb 3.2 oz (102.6 kg)   BMI Readings from Last 3 Encounters:  09/09/21 31.66 kg/m  07/05/21 31.94 kg/m  06/05/21 31.55 kg/m    Assessment/Interventions: Review of patient past medical history, allergies, medications, health status, including review of consultants reports, laboratory and other test data, was performed as part of comprehensive evaluation and provision of chronic care management services.   SDOH:  (Social Determinants of Health) assessments and interventions performed: No, done in the last year SDOH Interventions    [place] Office Visit from 08/26/2019 in [place] Visit from 05/12/2017 in [place]  SDOH Interventions    Depression Interventions/Treatment  Counseling Patient refuses Treatment      Financial Resource Strain: Low Risk  (09/06/2021)   Overall Financial Resource Strain (CARDIA)    Difficulty of Paying Living Expenses: Not hard at all    [place]: No Food Insecurity  (09/06/2021)  Housing: Low Risk  (09/06/2021)  Transportation Needs: No Transportation Needs (09/06/2021)  Depression (PHQ2-9): Low Risk  (09/06/2021)  Financial Resource Strain: Low Risk  (09/06/2021)  Physical Activity: Sufficiently Active (09/06/2021)  Social Connections: Socially Integrated (09/06/2021)  Stress: No Stress Concern Present (09/06/2021)  Tobacco Use: Medium Risk (09/09/2021)    CCM Care Plan  Allergies  Allergen Reactions   Penicillins Rash and Other (See Comments)    Because of a history of documented adverse serious  drug reaction;Medi Alert bracelet  is recommended Has patient had a PCN reaction causing immediate rash, facial/tongue/throat swelling, SOB or lightheadedness with hypotension: No Has patient had a PCN reaction causing severe rash involving mucus membranes or skin necrosis: No Has patient had a PCN reaction that required hospitalization: No Has patient had a PCN reaction occurring within the last 10 years: No If all of the above answers are "NO", t    Medications Reviewed Today     Reviewed by Edythe Clarity, [facility] (Pharmacist) on 10/21/21 at 1356  Med List Status: <None>   Medication Order Taking? Sig Documenting Provider Last Dose Status Informant  acetaminophen (TYLENOL) 500 MG tablet [id number] Yes Take 500 mg by mouth at bedtime.  [provider] Taking Active Self  aspirin 81 MG tablet [id number] Yes Take 1 tablet (81 mg total) by mouth every evening. Bensimhon, Shaune Pascal, MD Taking Active Self  Blood Glucose Monitoring Suppl (ONE TOUCH ULTRA 2) w/Device KIT [id number] Yes Use to test blood sugars daily. Dx: E11.9 Marin Olp, MD Taking Active   carvedilol (COREG) 6.25 MG tablet [id number] Yes TAKE 1 TABLET(6.25 MG) BY MOUTH TWICE DAILY WITH A MEAL Marin Olp, MD Taking Active   Cholecalciferol (VITAMIN D3) 2000 UNITS TABS [id number] Yes Take 2,000 Units by mouth daily. [provider] Taking Active Self  folic acid (FOLVITE) 659  MCG tablet [id number] Yes Take 400 mcg by mouth daily. [provider] Taking Active Self  furosemide (LASIX) 40 MG tablet [id number] Yes Take 1 tablet (40 mg total) by mouth daily as needed. Bensimhon, Shaune Pascal, MD Taking Active   Glucosamine HCl 1500 MG TABS [id number] Yes Take 1,500 mg by mouth every evening.  [provider] Taking Active Self  glucose blood test strip [id number] Yes Use to test blood sugars daily. Dx: E11.9 Marin Olp, MD Taking Active   Lancets (ONETOUCH DELICA PLUS BWLSLH73S) [place] [id number] Yes Use to test blood sugars daily. Dx: E11.9 Marin Olp, MD Taking Active   losartan (COZAAR) 25 MG tablet [id number] Yes Take 1 tablet (25 mg total) by mouth daily. Please schedule an appointment for further refills Bensimhon, Shaune Pascal, MD Taking Active   Melatonin 5 MG CHEW [id number] Yes Chew by mouth. [provider] Taking Active   metFORMIN (GLUCOPHAGE-XR) 500 MG 24 hr tablet [id number] Yes TAKE 1 TABLET(500 MG) BY MOUTH DAILY WITH BREAKFAST Marin Olp, MD Taking Active   Multiple Vitamin (MULTIVITAMIN WITH MINERALS) TABS [id number] Yes Take 1 tablet by mouth daily.  [provider] Taking Active Self  mupirocin ointment (BACTROBAN) 2 % [id number] Yes Apply topically 2 (two) times daily as needed. [provider] Taking Active   OneTouch Delica Lancets 45W MISC [id number] Yes USE TO TEST BLOOD SUGAR DAILY Marin Olp, MD Taking Active   potassium chloride SA (KLOR-CON M) 20 MEQ tablet [id number] Yes TAKE 1 TABLET BY MOUTH DAILY, GENERIC EQUIVALENT FOR KLOR-CON. Bensimhon, Shaune Pascal, MD Taking Active   simvastatin (ZOCOR) 40 MG tablet [id number] Yes Take 0.5 tablets (20 mg total) by mouth at bedtime. Bensimhon, Shaune Pascal, MD Taking Active   spironolactone (ALDACTONE) 25 MG tablet [id number] Yes Take 0.5 tablets (12.5 mg total) by mouth daily. Marin Olp, MD Taking Active   tamsulosin [facility]) 0.4 MG CAPS capsule [id number] Yes  Take 0.4 mg by mouth at bedtime. [provider] Taking Active   tiZANidine (ZANAFLEX) 2 MG tablet [id number] Yes TAKE 1 TABLET(2 MG) BY MOUTH DAILY  AS NEEDED FOR MUSCLE SPASMS. DO NOT DRIVE FOR 8 HOURS AFTER USE Marin Olp, MD Taking Active   triamcinolone cream (KENALOG) 0.1 % [id number] Yes  [provider] Taking Active   warfarin (COUMADIN) 5 MG tablet [id number] Yes TAKE ONE TABLET BY MOUTH ON MONDAY, WEDNESDAY, AND FRIDAY AND TAKE ONE-HALF TABLET ALL OTHER DAYS OR AS DIRECTED Marin Olp, MD Taking Active             Patient Active Problem List   Diagnosis Date Noted   Atherosclerosis of aorta ([place]) 05/18/2020   PVC's (premature ventricular contractions) 01/24/2020   Melena    Long term (current) use of anticoagulants 07/08/2017   Nephrolithiasis 03/24/2016   Erectile dysfunction 01/19/2014   Basal cell cancer 09/29/2012   Solitary pulmonary nodule 66/44/0347   Chronic systolic heart failure ([place]) 12/09/2010   Cardiomyopathy, ischemic 10/02/2009   Automatic implantable cardioverter-defibrillator in situ 01/16/2009   DM (diabetes mellitus) type II, controlled, with peripheral vascular disorder ([place]) 09/14/2008   BPH without obstruction/lower urinary tract symptoms 10/22/2007   Hyperlipidemia associated with type 2 diabetes mellitus ([place]) 04/22/2007   Essential hypertension 09/25/2006   CAD in native artery 09/25/2006   Osteoarthritis 09/25/2006   Hematuria 09/25/2006    Immunization History  Administered Date(s) Administered   Fluad Quad(high Dose 65+) 01/20/2020   Influenza, High Dose Seasonal PF 10/12/2018, 10/24/2020   Influenza,inj,Quad PF,6+ Mos 11/11/2012   Influenza-Unspecified 10/25/2013, 10/27/2014, 10/24/2015, 10/19/2017   PFIZER(Purple Top)SARS-COV-2 Vaccination 03/06/2019, 03/24/2019, 01/20/2020, 06/27/2020   PNEUMOCOCCAL CONJUGATE-20 09/06/2020   Pfizer Covid-19 Vaccine Bivalent Booster 49yr & up 12/10/2020, 06/05/2021    Pneumococcal Conjugate-13 08/21/2014   Pneumococcal Polysaccharide-23 10/02/2009   Td 09/17/2009, 10/31/2019   Zoster Recombinat (Shingrix) 06/27/2020, 08/27/2020   Zoster, Live 12/29/2012    Conditions to be addressed/monitored:  CAD, HF, HTN, Type II DM, HLD, Arthritis  Care Plan : General Pharmacy (Adult)  Updates made by DEdythe Clarity RPH since 10/21/2021 12:00 AM     Problem: CAD, HF, HTN, Type II DM, HLD, Arthritis   Priority: High  Onset Date: 12/27/2020     Long-Range Goal: Patient-Specific Goal   Start Date: 12/24/2020  Expected End Date: 06/26/2021  Recent Progress: On track  Priority: High  Note:   Current Barriers:  None at this time  Pharmacist Clinical Goal(s):  Patient will achieve improvement in A1c as evidenced by labs through collaboration with PharmD and provider.   Interventions: 1:1 collaboration with HMarin Olp MD regarding development and update of comprehensive plan of care as evidenced by provider attestation and co-signature Inter-disciplinary care team collaboration (see longitudinal plan of care) Comprehensive medication review performed; medication list updated in electronic medical record  Hypertension (BP goal <130/80) -Controlled, not assessed -Current treatment: Losartan 12.548mdaily Spironolactone 2570maily Carvedilol 6.69m2mD -Medications previously tried:  valsartan  -Reports hypotensive/hypertensive symptoms - dizziness or weakness, has improved some with decreased dose of losartan but not a huge difference -Educated on BP goals and benefits of medications for prevention of heart attack, stroke and kidney damage; Exercise goal of 150 minutes per week; Importance of home blood pressure monitoring; -Counseled to monitor BP at home daily to weekly, document, and provide log at future appointments -Recommended to continue current medication Try to increase physical activity as tolerated  Hyperlipidemia/CAD: (LDL goal <  70) 10/21/21 -Controlled, most recent LDL 71  -Current treatment: Simvastatin 40mg33mly Appropriate, Effective, Safe, Accessible -Medications previously tried: none noted  -Educated on Cholesterol  goals;  Benefits of statin for ASCVD risk reduction; Importance of limiting foods high in cholesterol; -LDL improving, continue to take statin at same dose, continued improved lifestyle will improve LDL. No changes at this time, continue routine lipid screenings.  Update 04/16/21 LDL 79 not quite at goal < 70.  He has previously not tolerated highet doses.  Due to age and inability to tolerate increased doses would leave as it.  Would rather him taki something that have side effects and stop taking. No changes at this time - continue to montior.  Diabetes (A1c goal <7%) 10/21/21 -Controlled, most recent A1c is 6.4% -Current medications: Metformin XR 561m daily Appropriate, Effective, Safe, Accessible -Medications previously tried: none noted  -Current home glucose readings: 110-115 is the range lately! -Denies hypoglycemic/hyperglycemic symptoms -Current meal patterns:  Same see previous -Current exercise: minimal -Educated on A1c and blood sugar goals; Carohydrate content of various foods/effect of glucose -Counseled to check feet daily and get yearly eye exams -Recommended to continue current medication -Has not had diarrhea from metformin in quite some time.  Blood sugars are fasting readings and they are controlled.  Discussed goals for FBG again. Continue current management, congratulations on the continued improvement.   Update 04/16/21 He is in the process of getting a new meter because of a crack in the glass.  Has improved his diet and cut out breads, rice, potatoes.  Now eating way less carbs than previous.  A1c has decreased to 6.7!  This is a big improvement - congratulations! Still having some GI upset with metformin but he believes it is improving. Monitor glucose at home  with new meter.  Goal < 130 fasting. Contact me if that is not the average. No changes at this time.  Heart Failure (Goal: manage symptoms and prevent exacerbations) -Controlled, not assessed -Last ejection fraction: 35%  -Current treatment: Carvedilol 6.221mBID Furosemide 4076maily -Medications previously tried: none noted  -Educated on Benefits of medications for managing symptoms and prolonging life Importance of weighing daily; if you gain more than 3 pounds in one day or 5 pounds in one week, contact providers Importance of blood pressure control Limit added salts in diet -Counseled on diet and exercise extensively Recommended to continue current medication Monitor weight for excess fluid.  Also stay hydrated because he is taking two meds that will cause fluid loss.  Dehydration could also be a source of his weakness.   Patient Goals/Self-Care Activities Patient will:  - take medications as prescribed as evidenced by patient report and record review check blood pressure weekly, document, and provide at future appointments weigh daily, and contact provider if weight gain of 3 lbs in one day or 5 lbs in one week Work on lifestyle mods to improve glucose  Follow Up Plan: The care management team will reach out to the patient again over the next 180 days.             Medication Assistance: None required.  Patient affirms current coverage meets needs.  Compliance/Adherence/Medication fill history: Care Gaps: None at this time  Star-Rating Drugs: Simvastatin 24m61mily 10/08/20 90ds Metformin ER 500mg80m14/22 90ds  Patient's preferred pharmacy is:  [facility] [id number]: [id number]:  [phone] Fax: [phone]  [facility] ([place]) [place], [place] [address] Phone: [phone] Fax: [phone]  [place], [place] [address] AT [facility] [address] [address] [place] [address] Phone: [phone] Fax: [phone]   Uses pill box? Yes Pt endorses 100% compliance  We discussed: Benefits of medication synchronization, packaging and delivery as well as enhanced pharmacist oversight with [employer]. Patient decided to: Continue current medication management strategy  Care Plan and Follow Up Patient Decision:  Patient agrees to Care Plan and Follow-up.  Plan: The care management team will reach out to the patient again over the next 120 days.  Beverly Milch, PharmD Clinical Pharmacist  [facility] [phone]

## 2021-10-21 ENCOUNTER — Ambulatory Visit: Payer: Medicare Other | Admitting: Pharmacist

## 2021-10-21 DIAGNOSIS — E1151 Type 2 diabetes mellitus with diabetic peripheral angiopathy without gangrene: Secondary | ICD-10-CM

## 2021-10-21 DIAGNOSIS — E1169 Type 2 diabetes mellitus with other specified complication: Secondary | ICD-10-CM

## 2021-10-21 NOTE — Patient Instructions (Addendum)
Visit Information   Goals Addressed             This Visit's Progress    COMPLETED: Set My Target A1C-Diabetes Type 2   On track    Timeframe:  Long-Range Goal Priority:  High Start Date:  12/27/20                           Expected End Date: 06/26/21                      Follow Up Date 03/29/21    A1c goal < 7.0   Why is this important?   Your target A1C is decided together by you and your doctor.  It is based on several things like your age and other health issues.    Notes:        Patient Care Plan: General Pharmacy (Adult)     Problem Identified: CAD, HF, HTN, Type II DM, HLD, Arthritis   Priority: High  Onset Date: 12/27/2020     Long-Range Goal: Patient-Specific Goal   Start Date: 12/24/2020  Expected End Date: 06/26/2021  Recent Progress: On track  Priority: High  Note:   Current Barriers:  None at this time  Pharmacist Clinical Goal(s):  Patient will achieve improvement in A1c as evidenced by labs through collaboration with PharmD and provider.   Interventions: 1:1 collaboration with Marin Olp, MD regarding development and update of comprehensive plan of care as evidenced by provider attestation and co-signature Inter-disciplinary care team collaboration (see longitudinal plan of care) Comprehensive medication review performed; medication list updated in electronic medical record  Hypertension (BP goal <130/80) -Controlled, not assessed -Current treatment: Losartan 12.'5mg'$  daily Spironolactone '25mg'$  daily Carvedilol 6.'25mg'$  BID -Medications previously tried:  valsartan  -Reports hypotensive/hypertensive symptoms - dizziness or weakness, has improved some with decreased dose of losartan but not a huge difference -Educated on BP goals and benefits of medications for prevention of heart attack, stroke and kidney damage; Exercise goal of 150 minutes per week; Importance of home blood pressure monitoring; -Counseled to monitor BP at home daily to  weekly, document, and provide log at future appointments -Recommended to continue current medication Try to increase physical activity as tolerated  Hyperlipidemia/CAD: (LDL goal < 70) 10/21/21 -Controlled, most recent LDL 71  -Current treatment: Simvastatin '40mg'$  daily Appropriate, Effective, Safe, Accessible -Medications previously tried: none noted  -Educated on Cholesterol goals;  Benefits of statin for ASCVD risk reduction; Importance of limiting foods high in cholesterol; -LDL improving, continue to take statin at same dose, continued improved lifestyle will improve LDL. No changes at this time, continue routine lipid screenings.  Update 04/16/21 LDL 79 not quite at goal < 70.  He has previously not tolerated highet doses.  Due to age and inability to tolerate increased doses would leave as it.  Would rather him taki something that have side effects and stop taking. No changes at this time - continue to montior.  Diabetes (A1c goal <7%) 10/21/21 -Controlled, most recent A1c is 6.4% -Current medications: Metformin XR '500mg'$  daily Appropriate, Effective, Safe, Accessible -Medications previously tried: none noted  -Current home glucose readings: 110-115 is the range lately! -Denies hypoglycemic/hyperglycemic symptoms -Current meal patterns:  Same see previous -Current exercise: minimal -Educated on A1c and blood sugar goals; Carohydrate content of various foods/effect of glucose -Counseled to check feet daily and get yearly eye exams -Recommended to continue current medication -Has not had diarrhea  from metformin in quite some time.  Blood sugars are fasting readings and they are controlled.  Discussed goals for FBG again. Continue current management, congratulations on the continued improvement.   Update 04/16/21 He is in the process of getting a new meter because of a crack in the glass.  Has improved his diet and cut out breads, rice, potatoes.  Now eating way less carbs  than previous.  A1c has decreased to 6.7!  This is a big improvement - congratulations! Still having some GI upset with metformin but he believes it is improving. Monitor glucose at home with new meter.  Goal < 130 fasting. Contact me if that is not the average. No changes at this time.  Heart Failure (Goal: manage symptoms and prevent exacerbations) -Controlled, not assessed -Last ejection fraction: 35%  -Current treatment: Carvedilol 6.'25mg'$  BID Furosemide '40mg'$  daily -Medications previously tried: none noted  -Educated on Benefits of medications for managing symptoms and prolonging life Importance of weighing daily; if you gain more than 3 pounds in one day or 5 pounds in one week, contact providers Importance of blood pressure control Limit added salts in diet -Counseled on diet and exercise extensively Recommended to continue current medication Monitor weight for excess fluid.  Also stay hydrated because he is taking two meds that will cause fluid loss.  Dehydration could also be a source of his weakness.   Patient Goals/Self-Care Activities Patient will:  - take medications as prescribed as evidenced by patient report and record review check blood pressure weekly, document, and provide at future appointments weigh daily, and contact provider if weight gain of 3 lbs in one day or 5 lbs in one week Work on lifestyle mods to improve glucose  Follow Up Plan: The care management team will reach out to the patient again over the next 180 days.           The patient verbalized understanding of instructions, educational materials, and care plan provided today and DECLINED offer to receive copy of patient instructions, educational materials, and care plan.  Telephone follow up appointment with pharmacy team member scheduled for: 6 months  Edythe Clarity, [place], PharmD Clinical Pharmacist  [facility] [phone]

## 2021-10-22 ENCOUNTER — Telehealth: Payer: Medicare Other

## 2021-11-01 ENCOUNTER — Ambulatory Visit (INDEPENDENT_AMBULATORY_CARE_PROVIDER_SITE_OTHER): Payer: Medicare Other

## 2021-11-01 ENCOUNTER — Ambulatory Visit: Payer: Medicare Other | Admitting: Family

## 2021-11-01 ENCOUNTER — Encounter: Payer: Self-pay | Admitting: Family

## 2021-11-01 VITALS — BP 96/56 | HR 90 | Temp 98.2°F | Ht 71.0 in | Wt 225.6 lb

## 2021-11-01 DIAGNOSIS — I952 Hypotension due to drugs: Secondary | ICD-10-CM

## 2021-11-01 DIAGNOSIS — R059 Cough, unspecified: Secondary | ICD-10-CM | POA: Diagnosis not present

## 2021-11-01 DIAGNOSIS — I255 Ischemic cardiomyopathy: Secondary | ICD-10-CM

## 2021-11-01 DIAGNOSIS — Z23 Encounter for immunization: Secondary | ICD-10-CM

## 2021-11-01 LAB — POC COVID19 BINAXNOW: SARS Coronavirus 2 Ag: NEGATIVE

## 2021-11-01 NOTE — Progress Notes (Signed)
Patient ID: Joseph Malone, male    DOB: 28-May-1930, 88 y.o.   MRN: [id number]  Chief Complaint  Patient presents with   Cough    sx x 2d    HPI:      Cough:  Pt c/o dry cough and congestion for about 2 days. Also reports a little nasal drainage, no congestion. Has tried robitussin and chloraseptic spray for his throat. He reports feeling a little better today. Also has HF, but his weight was down 1lb today, takes Lasix prn.      Assessment & Plan:  1. Cough, unspecified type lungs clear, rapid covid negative. pt feels better today. Advised ok to take a generic Claritin (has at home) to help with nasal drainage and postnasal drip.   - POC COVID-19  2. Hypotension due to drugs reports taking his Coreg and Losartan about 30 min prior to coming into office. He checks his BP at home and sometimes does not take his 2nd Coreg pill. Advised to hydrate well today and recheck BP prior to his 2nd Coreg dose.  Subjective:    Outpatient Medications Prior to Visit  Medication Sig Dispense Refill   acetaminophen (TYLENOL) 500 MG tablet Take 500 mg by mouth at bedtime.      aspirin 81 MG tablet Take 1 tablet (81 mg total) by mouth every evening.     Blood Glucose Monitoring Suppl (ONE TOUCH ULTRA 2) w/Device KIT Use to test blood sugars daily. Dx: E11.9 1 kit 3   carvedilol (COREG) 6.25 MG tablet TAKE 1 TABLET(6.25 MG) BY MOUTH TWICE DAILY WITH A MEAL 180 tablet 3   Cholecalciferol (VITAMIN D3) 2000 UNITS TABS Take 2,000 Units by mouth daily.     folic acid (FOLVITE) 109 MCG tablet Take 400 mcg by mouth daily.     furosemide (LASIX) 40 MG tablet Take 1 tablet (40 mg total) by mouth daily as needed. 30 tablet 3   Glucosamine HCl 1500 MG TABS Take 1,500 mg by mouth every evening.      glucose blood test strip Use to test blood sugars daily. Dx: E11.9 100 each 12   Lancets (ONETOUCH DELICA PLUS [id number]) MISC Use to test blood sugars daily. Dx: E11.9 100 each 3   losartan (COZAAR) 25 MG tablet  Take 1 tablet (25 mg total) by mouth daily. Please schedule an appointment for further refills 30 tablet 3   Melatonin 5 MG CHEW Chew by mouth.     metFORMIN (GLUCOPHAGE-XR) 500 MG 24 hr tablet TAKE 1 TABLET(500 MG) BY MOUTH DAILY WITH BREAKFAST 30 tablet 5   Multiple Vitamin (MULTIVITAMIN WITH MINERALS) TABS Take 1 tablet by mouth daily.      mupirocin ointment (BACTROBAN) 2 % Apply topically 2 (two) times daily as needed.     OneTouch Delica Lancets 02R MISC USE TO TEST BLOOD SUGAR DAILY 100 each 3   simvastatin (ZOCOR) 40 MG tablet Take 0.5 tablets (20 mg total) by mouth at bedtime. 45 tablet 3   spironolactone (ALDACTONE) 25 MG tablet Take 0.5 tablets (12.5 mg total) by mouth daily. 90 tablet 3   tamsulosin (FLOMAX) 0.4 MG CAPS capsule Take 0.4 mg by mouth at bedtime.     tiZANidine (ZANAFLEX) 2 MG tablet TAKE 1 TABLET(2 MG) BY MOUTH DAILY AS NEEDED FOR MUSCLE SPASMS. DO NOT DRIVE FOR 8 HOURS AFTER USE 90 tablet 0   triamcinolone cream (KENALOG) 0.1 %      warfarin (COUMADIN) 5 MG tablet TAKE ONE  TABLET BY MOUTH ON MONDAY, WEDNESDAY, AND FRIDAY AND TAKE ONE-HALF TABLET ALL OTHER DAYS OR AS DIRECTED 100 tablet 1   potassium chloride SA (KLOR-CON M) 20 MEQ tablet TAKE 1 TABLET BY MOUTH DAILY, GENERIC EQUIVALENT FOR KLOR-CON. (Patient not taking: Reported on 11/01/2021) 90 tablet 3   No facility-administered medications prior to visit.   Past Medical History:  Diagnosis Date   Anxiety    BPH (benign prostatic hypertrophy)    CAD (coronary artery disease)    s/p CABG   CHF (congestive heart failure) (HCC)    Diverticulosis    Diverticulosis of colon without hemorrhage 10/22/2007   Qualifier: Diagnosis of  By: Hopper MD, William     DOE (dyspnea on exertion)    Excess weight    Fasting hyperglycemia    Generalized weakness    GERD (gastroesophageal reflux disease)    Heart attack (HCC) 2001   Heart failure    CHF due to ischemic CM   History of kidney stones    Hyperlipidemia     Hypertension    Ischemic cardiomyopathy    Microscopic hematuria    [facility]   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   Past Surgical History:  Procedure Laterality Date   APPENDECTOMY     BIOPSY  10/21/2018   Procedure: BIOPSY;  Surgeon: Armbruster, Steven P, MD;  Location: [facility];  Service: Gastroenterology;;   CARDIAC CATHETERIZATION  2000   CARDIAC DEFIBRILLATOR PLACEMENT  2005   [employer]tr[employer]; s/p ICD gen change and RV lead revision April 2015   CATARACT EXTRACTION  2008   bilateral with lens implant   COLONOSCOPY  2004   Tics; [facility]   CORONARY ARTERY BYPASS GRAFT  2000   1 vessel   CYSTOSCOPY  06/2012   Dr Woodruff   ESOPHAGOGASTRODUODENOSCOPY (EGD) WITH PROPOFOL N/A 10/21/2018   Procedure: ESOPHAGOGASTRODUODENOSCOPY (EGD) WITH PROPOFOL;  Surgeon: Armbruster, Steven P, MD;  Location: [facility];  Service: Gastroenterology;  Laterality: N/A;   HOT HEMOSTASIS N/A 10/21/2018   Procedure: HOT HEMOSTASIS (ARGON PLASMA COAGULATION/BICAP);  Surgeon: Armbruster, Steven P, MD;  Location: [facility];  Service: Gastroenterology;  Laterality: N/A;   IMPLANTABLE CARDIOVERTER DEFIBRILLATOR (ICD) GENERATOR CHANGE N/A 06/01/2013   Procedure: ICD GENERATOR CHANGE;  Surgeon: Gregg W Taylor, MD;  [facility]: [facility];  Service: Cardiovascular;  Laterality: N/A;   LEAD REVISION N/A 06/01/2013   Procedure: LEAD REVISION;  Surgeon: Gregg W Taylor, MD;  Location: [facility];  Service: Cardiovascular;  Laterality: N/A;   PILONIDAL CYST EXCISION     RIGHT/LEFT HEART CATH AND CORONARY/GRAFT ANGIOGRAPHY N/A 06/12/2017   Procedure: RIGHT/LEFT HEART CATH AND CORONARY/GRAFT ANGIOGRAPHY;  Surgeon: Bensimhon, Daniel R, MD;  [facility]: [facility];  Service: Cardiovascular;  Laterality: N/A;   TRANSTHORACIC ECHOCARDIOGRAM  08/29/2005   Allergies  Allergen Reactions   Penicillins Rash and Other (See Comments)    Because of a history of documented adverse serious drug reaction;Medi  Alert bracelet  is recommended Has patient had a PCN reaction causing immediate rash, facial/tongue/throat swelling, SOB or lightheadedness with hypotension: No Has patient had a PCN reaction causing severe rash involving mucus membranes or skin necrosis: No Has patient had a PCN reaction that required hospitalization: No Has patient had a PCN reaction occurring within the last 10 years: No If all of the above answers are "NO", t      Objective:    Physical Exam Vitals and nursing note reviewed.  Constitutional:        General: He is not in acute distress.    Appearance: Normal appearance.  HENT:     Head: Normocephalic.     Right Ear: Tympanic membrane and ear canal normal.     Left Ear: Tympanic membrane and ear canal normal.     Nose:     Right Sinus: No frontal sinus tenderness.     Left Sinus: No frontal sinus tenderness.     Mouth/Throat:     Mouth: Mucous membranes are moist.     Pharynx: No pharyngeal swelling, oropharyngeal exudate, posterior oropharyngeal erythema or uvula swelling.  Cardiovascular:     Rate and Rhythm: Normal rate and regular rhythm.  Pulmonary:     Effort: Pulmonary effort is normal.     Breath sounds: Normal breath sounds.  Musculoskeletal:        General: Normal range of motion.     Cervical back: Normal range of motion.  Lymphadenopathy:     Cervical: No cervical adenopathy.     Upper Body:     Right upper body: No supraclavicular adenopathy.     Left upper body: No supraclavicular adenopathy.  Skin:    General: Skin is warm and dry.  Neurological:     Mental Status: He is alert and oriented to person, place, and time.  Psychiatric:        Mood and Affect: Mood normal.    BP (!) 96/56 (BP Location: Left Arm, Patient Position: Sitting, Cuff Size: Large)   Pulse 90   Temp 98.2 F (36.8 C) (Temporal)   Ht 5' 11" (1.803 m)   Wt 225 lb 9.6 oz (102.3 kg)   SpO2 93%   BMI 31.46 kg/m  Wt Readings from Last 3 Encounters:  11/01/21 225 lb  9.6 oz (102.3 kg)  09/09/21 227 lb (103 kg)  07/05/21 229 lb (103.9 kg)       Jeanie Sewer, NP

## 2021-11-01 NOTE — Patient Instructions (Addendum)
It was very nice to see you today!   We gave you a flu shot today.  Your lungs sound good. You can continue to take Robitussin for your cough.  Also recommend taking your Claritin for next few days or as needed for runny nose or postnasal drip causing you to clear your throat frequently.  Check your blood pressure later before taking your 2nd Coreg pill - if < 100/60 I would not take. Be sure to hydrate well every day!      PLEASE NOTE:  If you had any lab tests please let us know if you have not heard back within a few days. You may see your results on MyChart before we have a chance to review them but we will give you a call once they are reviewed by [place]. If we ordered any referrals today, please let us know if you have not heard from their office within the next week.

## 2021-11-04 ENCOUNTER — Other Ambulatory Visit (HOSPITAL_COMMUNITY): Payer: Self-pay

## 2021-11-04 DIAGNOSIS — I5022 Chronic systolic (congestive) heart failure: Secondary | ICD-10-CM

## 2021-11-04 LAB — CUP PACEART REMOTE DEVICE CHECK
Battery Remaining Longevity: 41 mo
Battery Voltage: 2.96 V
Brady Statistic RV Percent Paced: 0.09 %
Date Time Interrogation Session: 20230923074223
HighPow Impedance: 81 Ohm
Implantable Lead Implant Date: 20150422
Implantable Lead Location: 753860
Implantable Lead Model: 6935
Implantable Pulse Generator Implant Date: 20150422
Lead Channel Impedance Value: 418 Ohm
Lead Channel Impedance Value: 513 Ohm
Lead Channel Pacing Threshold Amplitude: 0.625 V
Lead Channel Pacing Threshold Pulse Width: 0.4 ms
Lead Channel Sensing Intrinsic Amplitude: 11.375 mV
Lead Channel Sensing Intrinsic Amplitude: 11.375 mV
Lead Channel Setting Pacing Amplitude: 2 V
Lead Channel Setting Pacing Pulse Width: 0.4 ms
Lead Channel Setting Sensing Sensitivity: 0.3 mV

## 2021-11-04 NOTE — Progress Notes (Signed)
Orders Placed This Encounter  Procedures   ECHOCARDIOGRAM COMPLETE    Standing Status:   Future    Standing Expiration Date:   11/05/2022    Order Specific Question:   Where should this test be performed    Answer:   [facility]    Order Specific Question:   Perflutren DEFINITY (image enhancing agent) should be administered unless hypersensitivity or allergy exist    Answer:   Administer Perflutren    Order Specific Question:   Reason for exam-Echo    Answer:   Congestive Heart Failure  I50.9    Order Specific Question:   Release to patient    Answer:   Immediate

## 2021-11-06 ENCOUNTER — Ambulatory Visit (INDEPENDENT_AMBULATORY_CARE_PROVIDER_SITE_OTHER): Payer: Medicare Other

## 2021-11-06 DIAGNOSIS — Z7901 Long term (current) use of anticoagulants: Secondary | ICD-10-CM | POA: Diagnosis not present

## 2021-11-06 LAB — POCT INR: INR: 2.5 (ref 2.0–3.0)

## 2021-11-06 NOTE — Patient Instructions (Signed)
Continue 1 tablet daily except 1/2 tablet on Mondays and Thursdays.  Re-check in 4 weeks.

## 2021-11-06 NOTE — Progress Notes (Signed)
I have reviewed and agree with note, evaluation, plan.   Maurianna Benard, MD  

## 2021-11-06 NOTE — Progress Notes (Signed)
Continue 1 tablet daily except 1/2 tablet on Mondays and Thursdays.  Re-check in 4 weeks.

## 2021-11-12 NOTE — Progress Notes (Signed)
Remote ICD transmission.   

## 2021-11-14 ENCOUNTER — Ambulatory Visit (HOSPITAL_COMMUNITY)
Admission: RE | Admit: 2021-11-14 | Discharge: 2021-11-14 | Disposition: A | Discharge status: Home / Self Care | Payer: Medicare Other | Source: Ambulatory Visit | Attending: Family Medicine | Admitting: Family Medicine

## 2021-11-14 ENCOUNTER — Ambulatory Visit (HOSPITAL_BASED_OUTPATIENT_CLINIC_OR_DEPARTMENT_OTHER)
Admission: RE | Admit: 2021-11-14 | Discharge: 2021-11-14 | Disposition: A | Discharge status: Home / Self Care | Payer: Medicare Other | Source: Ambulatory Visit | Attending: Internal Medicine | Admitting: Internal Medicine

## 2021-11-14 ENCOUNTER — Encounter (HOSPITAL_COMMUNITY): Payer: Self-pay | Admitting: Internal Medicine

## 2021-11-14 VITALS — BP 110/70 | HR 76 | Wt 229.2 lb

## 2021-11-14 DIAGNOSIS — E119 Type 2 diabetes mellitus without complications: Secondary | ICD-10-CM | POA: Insufficient documentation

## 2021-11-14 DIAGNOSIS — I252 Old myocardial infarction: Secondary | ICD-10-CM | POA: Diagnosis not present

## 2021-11-14 DIAGNOSIS — R0602 Shortness of breath: Secondary | ICD-10-CM | POA: Diagnosis not present

## 2021-11-14 DIAGNOSIS — I5022 Chronic systolic (congestive) heart failure: Secondary | ICD-10-CM | POA: Diagnosis not present

## 2021-11-14 DIAGNOSIS — Z951 Presence of aortocoronary bypass graft: Secondary | ICD-10-CM | POA: Diagnosis not present

## 2021-11-14 DIAGNOSIS — I2510 Atherosclerotic heart disease of native coronary artery without angina pectoris: Secondary | ICD-10-CM | POA: Diagnosis not present

## 2021-11-14 DIAGNOSIS — Z95810 Presence of automatic (implantable) cardiac defibrillator: Secondary | ICD-10-CM | POA: Diagnosis not present

## 2021-11-14 DIAGNOSIS — Z7901 Long term (current) use of anticoagulants: Secondary | ICD-10-CM | POA: Diagnosis not present

## 2021-11-14 DIAGNOSIS — I255 Ischemic cardiomyopathy: Secondary | ICD-10-CM | POA: Insufficient documentation

## 2021-11-14 DIAGNOSIS — Z6831 Body mass index (BMI) 31.0-31.9, adult: Secondary | ICD-10-CM | POA: Insufficient documentation

## 2021-11-14 DIAGNOSIS — Z79899 Other long term (current) drug therapy: Secondary | ICD-10-CM | POA: Insufficient documentation

## 2021-11-14 DIAGNOSIS — I110 Hypertensive heart disease with heart failure: Secondary | ICD-10-CM | POA: Diagnosis not present

## 2021-11-14 DIAGNOSIS — E669 Obesity, unspecified: Secondary | ICD-10-CM | POA: Insufficient documentation

## 2021-11-14 DIAGNOSIS — I491 Atrial premature depolarization: Secondary | ICD-10-CM | POA: Diagnosis not present

## 2021-11-14 DIAGNOSIS — I493 Ventricular premature depolarization: Secondary | ICD-10-CM | POA: Diagnosis not present

## 2021-11-14 DIAGNOSIS — E785 Hyperlipidemia, unspecified: Secondary | ICD-10-CM | POA: Diagnosis not present

## 2021-11-14 LAB — ECHOCARDIOGRAM COMPLETE
AR max vel: 2.59 cm2
AV Area VTI: 2.46 cm2
AV Area mean vel: 2.6 cm2
AV Mean grad: 2 mmHg
AV Peak grad: 4.5 mmHg
Ao pk vel: 1.06 m/s
Area-P 1/2: 3.01 cm2
Calc EF: 40.8 %
S' Lateral: 3.8 cm
Single Plane A2C EF: 31.1 %
Single Plane A4C EF: 48.9 %

## 2021-11-14 LAB — BASIC METABOLIC PANEL
Anion gap: 8 (ref 5–15)
BUN: 28 mg/dL — ABNORMAL HIGH (ref 8–23)
CO2: 24 mmol/L (ref 22–32)
Calcium: 9.4 mg/dL (ref 8.9–10.3)
Chloride: 112 mmol/L — ABNORMAL HIGH (ref 98–111)
Creatinine, Ser: 1.28 mg/dL — ABNORMAL HIGH (ref 0.61–1.24)
GFR, Estimated: 53 mL/min — ABNORMAL LOW (ref >60)
Glucose, Bld: 140 mg/dL — ABNORMAL HIGH (ref 70–99)
Potassium: 4.3 mmol/L (ref 3.5–5.1)
Sodium: 144 mmol/L (ref 135–145)

## 2021-11-14 MED ORDER — PERFLUTREN LIPID MICROSPHERE
1.0000 mL | INTRAVENOUS | Status: DC | PRN
  Administered 2021-11-14: 2 mL via INTRAVENOUS

## 2021-11-14 NOTE — Progress Notes (Signed)
  Echocardiogram 2D Echocardiogram has been performed.  Joseph Malone 11/14/2021, 1:50 PM

## 2021-11-14 NOTE — Progress Notes (Signed)
[facility] Note   Date:  11/14/2021   ID:  Joseph Malone, DOB 09-Feb-1931, MRN [id number]  Location: Home  Provider location: [facility] Type of Visit: Established patient  PCP:  Marin Olp, MD  Cardiologist:  None Primary HF: Joseph Malone  Chief Complaint: Heart Failure follow-up   History of Present Illness:  Joseph Malone is a pleasant 88 year old male with a history of coronary artery disease status post large anterior MI with coronary  bypass grafting in 2001 with a LIMA to the LAD.  He has history of  congestive heart failure secondary to resultant ischemic cardiomyopathy, ejection fraction however is in the 35%.  He is status post  single-chamber ICD.    The rest of his medical history is notable for obesity, hypertension,  hyperlipidemia, and glucose intolerance.  Cath in 5/19 with stable CAD with patent LIMA to LAD.  Today he returns for HF follow up.Overall feeling fine. SOB with inclines. Denies PND/Orthopnea. Appetite ok. No fever or chills. Weight at home  217-222 pounds. Walking on the treadmill 20 mg and 20 min stationary biked 2-3 times a day. SBP at home 120-140s with 2 low SBP 99-104. Usually low after shower or exercise. Taking all medications. Usually taking lasix 4 times a month.    RHC/LHC 06/12/2017   Ao = 143/77 (105) LV 145/8 RA = 3 RV = 26/4 PA = 27/5 (12) PCW = 9 Fick cardiac output/index = 4.4/2.0 PVR = 0.7 WU FA sat = 97% PA sat = 62%, 63%  Assessment: 1. Left-dominant coronary system with chronically-occluded LAD 2. Non-obstructive CAD elsewhere 3. Patent LIMA to LAD 4. LV gram not performed due to LV thrombus 5. Normal right heart pressure with mildly reduced cardiac output   ECHO 05/2017 EF 30-35% with   Joseph Malone denies symptoms worrisome for COVID 19.   Past Medical History:  Diagnosis Date   Anxiety    BPH (benign prostatic hypertrophy)    CAD (coronary artery disease)    s/p CABG    CHF (congestive heart failure) (HCC)    Diverticulosis    Diverticulosis of colon without hemorrhage 10/22/2007   Qualifier: Diagnosis of  By: Linna Darner MD, [name]     DOE (dyspnea on exertion)    Excess weight    Fasting hyperglycemia    Generalized weakness    GERD (gastroesophageal reflux disease)    Heart attack ([place]) 2001   Heart failure    CHF due to ischemic CM   History of kidney stones    Hyperlipidemia    Hypertension    Ischemic cardiomyopathy    Microscopic hematuria    [facility]   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   Past Surgical History:  Procedure Laterality Date   APPENDECTOMY     BIOPSY  10/21/2018   Procedure: BIOPSY;  Surgeon: Yetta Flock, MD;  [facility]: [facility];  Service: Gastroenterology;;   [place]  2005   [employer]; s/p ICD gen change and RV lead revision April 2015   CATARACT EXTRACTION  2008   bilateral with lens implant   COLONOSCOPY  2004   Tics; [facility]   CORONARY ARTERY BYPASS GRAFT  2000   1 vessel   CYSTOSCOPY  06/2012   Dr [name]   ESOPHAGOGASTRODUODENOSCOPY (EGD) WITH PROPOFOL N/A 10/21/2018   Procedure: ESOPHAGOGASTRODUODENOSCOPY (EGD) WITH PROPOFOL;  Surgeon: Yetta Flock, MD;  Location: [facility];  Service: Gastroenterology;  Laterality: N/A;   HOT HEMOSTASIS N/A 10/21/2018   Procedure: HOT HEMOSTASIS (ARGON PLASMA COAGULATION/BICAP);  Surgeon: Yetta Flock, MD;  Location: [facility];  Service: Gastroenterology;  Laterality: N/A;   IMPLANTABLE CARDIOVERTER DEFIBRILLATOR (ICD) GENERATOR CHANGE N/A 06/01/2013   Procedure: ICD GENERATOR CHANGE;  Surgeon: [name], MD;  Location: [facility] CATH LAB;  Service: Cardiovascular;  Laterality: N/A;   LEAD REVISION N/A 06/01/2013   Procedure: LEAD REVISION;  Surgeon: [name], MD;  Location: [facility] CATH LAB;  Service: Cardiovascular;  Laterality: N/A;   PILONIDAL CYST EXCISION      RIGHT/LEFT HEART CATH AND CORONARY/GRAFT ANGIOGRAPHY N/A 06/12/2017   Procedure: RIGHT/LEFT HEART CATH AND CORONARY/GRAFT ANGIOGRAPHY;  Surgeon: [name], MD;  Location: [facility] LAB;  Service: Cardiovascular;  Laterality: N/A;   TRANSTHORACIC ECHOCARDIOGRAM  08/29/2005     Current Outpatient Medications  Medication Sig Dispense Refill   acetaminophen (TYLENOL) 500 MG tablet Take 500 mg by mouth at bedtime.      aspirin 81 MG tablet Take 1 tablet (81 mg total) by mouth every evening.     Blood Glucose Monitoring Suppl (ONE TOUCH ULTRA 2) w/Device KIT Use to test blood sugars daily. Dx: E11.9 1 kit 3   carvedilol (COREG) 3.125 MG tablet Take 3.125 mg by mouth 2 (two) times daily with a meal.     Cholecalciferol (VITAMIN D3) 2000 UNITS TABS Take 2,000 Units by mouth daily.     folic acid (FOLVITE) 540 MCG tablet Take 400 mcg by mouth daily.     furosemide (LASIX) 40 MG tablet Take 1 tablet (40 mg total) by mouth daily as needed. 30 tablet 3   Glucosamine HCl 1500 MG TABS Take 1,500 mg by mouth every evening.      glucose blood test strip Use to test blood sugars daily. Dx: E11.9 100 each 12   Lancets (ONETOUCH DELICA PLUS [id number]) MISC Use to test blood sugars daily. Dx: E11.9 100 each 3   losartan (COZAAR) 25 MG tablet Take 1 tablet (25 mg total) by mouth daily. Please schedule an appointment for further refills 30 tablet 3   Melatonin 5 MG CHEW Chew by mouth.     metFORMIN (GLUCOPHAGE-XR) 500 MG 24 hr tablet TAKE 1 TABLET(500 MG) BY MOUTH DAILY WITH BREAKFAST 30 tablet 5   Multiple Vitamin (MULTIVITAMIN WITH MINERALS) TABS Take 1 tablet by mouth daily.      mupirocin ointment (BACTROBAN) 2 % Apply topically 2 (two) times daily as needed.     OneTouch Delica Lancets 29F MISC USE TO TEST BLOOD SUGAR DAILY 100 each 3   simvastatin (ZOCOR) 40 MG tablet Take 0.5 tablets (20 mg total) by mouth at bedtime. 45 tablet 3   spironolactone (ALDACTONE) 25 MG tablet Take 0.5 tablets (12.5  mg total) by mouth daily. 90 tablet 3   tamsulosin (FLOMAX) 0.4 MG CAPS capsule Take 0.4 mg by mouth at bedtime.     tiZANidine (ZANAFLEX) 2 MG tablet TAKE 1 TABLET(2 MG) BY MOUTH DAILY AS NEEDED FOR MUSCLE SPASMS. DO NOT DRIVE FOR 8 HOURS AFTER USE 90 tablet 0   triamcinolone cream (KENALOG) 0.1 %      warfarin (COUMADIN) 5 MG tablet TAKE ONE TABLET BY MOUTH ON MONDAY, WEDNESDAY, AND FRIDAY AND TAKE ONE-HALF TABLET ALL OTHER DAYS OR AS DIRECTED 100 tablet 1   No current facility-administered medications for this encounter.   Facility-Administered Medications Ordered in Other Encounters  Medication Dose Route Frequency Provider Last Rate Last Admin   perflutren lipid microspheres (DEFINITY) IV suspension  1-10 mL Intravenous PRN Sherral Dirocco, Shaune Pascal, MD   2 mL at 11/14/21 1351    Allergies:   Penicillins   Social History:  The patient  reports that he has never smoked. He has been exposed to tobacco smoke. He has never used smokeless tobacco. He reports that he does not drink alcohol and does not use drugs.   Family History:  The patient's family history includes Diabetes in his brother; Heart attack (age of onset: 51) in his brother, father, and paternal uncle; Hypertension in his father; Lung cancer in his sister; Nephrolithiasis in his brother; Sudden death (age of onset: 67) in his paternal grandfather.   ROS:  Please see the history of present illness.   All other systems are personally reviewed and negative.   Vitals:   11/14/21 1357  BP: 110/70  Pulse: 76  SpO2: 95%  Weight: 104 kg (229 lb 3.2 oz)   Wt Readings from Last 3 Encounters:  11/14/21 104 kg (229 lb 3.2 oz)  11/01/21 102.3 kg (225 lb 9.6 oz)  09/09/21 103 kg (227 lb)     Exam:  General:  Well appearing. No resp difficulty HEENT: normal Neck: supple. no JVD. Carotids 2+ bilat; no bruits. No lymphadenopathy or thryomegaly appreciated. Cor: PMI nondisplaced. Regular rate & rhythm. No rubs, gallops or  murmurs. Lungs: clear Abdomen: soft, nontender, nondistended. No hepatosplenomegaly. No bruits or masses. Good bowel sounds. Extremities: no cyanosis, clubbing, rash, edema Neuro: alert & orientedx3, cranial nerves grossly intact. moves all 4 extremities w/o difficulty. Affect pleasant   EKG: SR 78 bpm with occasional PVC   Recent Labs: 09/09/2021: ALT 19; BUN 27; Creatinine, Ser 1.40; Hemoglobin 14.5; Platelets 151.0; Potassium 4.2; Sodium 138  Personally reviewed   Wt Readings from Last 3 Encounters:  11/14/21 104 kg (229 lb 3.2 oz)  11/01/21 102.3 kg (225 lb 9.6 oz)  09/09/21 103 kg (227 lb)      ASSESSMENT AND PLAN:  1) CAD: s/p CABG 2001with LIMA to LAD  -  Cath  5/19 with intact revascularization and normal filling pressures  - No chest pain.  2) Chronic systolic HF: ICM,  - Echo 05/2017 EF 30-35% with apical thrombus. [employer] ICD. No shocks - Cath with 5/19 with intact revascularization and normal filling pressures  - Echo today EF unchanged at 30-35%. Dr Haroldine Laws discussed and reviewed.  Optivol fluid index low. Activity ~ 1 hour per day.  - NYHA II. Volume status stable. Volume status ok. Continue lasix 40 mg daily as needed.   - Continue 12.5 mg spiro daily.  - Continue coreg 3.125  mg twice a day + losartan 25 mg daily.  - Will defer SGLT2i at this point. Has had some UTIs  -4) HTN - Stable.  5) HLD - managed by PCP. Continue statin. No change.  6) Apical Thrombus - On coumadin. No bleeding issues.   Check BMET   Follow up in 1 year.      Darrick Grinder, NP  11/14/2021 2:14 PM  [facility] [address] [facility] [place] [address] [phone] (office) [phone] (fax)  Patient seen and examined with the above-signed Advanced Practice Provider and/or Housestaff. I personally reviewed laboratory data, imaging studies and relevant notes. I independently examined the patient and formulated the  important aspects of the plan. I have edited the note  to reflect any of my changes or salient points. I have personally discussed the plan with the patient and/or family.  Doing very well. NYHA II. Volume status ok. EF 30-35% on echo today (stable).   General:  Well appearing. No resp difficulty HEENT: normal Neck: supple. no JVD. Carotids 2+ bilat; no bruits. No lymphadenopathy or thryomegaly appreciated. Cor: PMI nondisplaced. Regular rate & rhythm. No rubs, gallops or murmurs. Lungs: clear Abdomen: soft, nontender, nondistended. No hepatosplenomegaly. No bruits or masses. Good bowel sounds. Extremities: no cyanosis, clubbing, rash, edema Neuro: alert & orientedx3, cranial nerves grossly intact. moves all 4 extremities w/o difficulty. Affect pleasant  Stable EF 30-35% on echo today. On good meds. No angina. Not using SGLT2i with history of UTIs.   Glori Bickers, MD  3:52 PM

## 2021-11-14 NOTE — Patient Instructions (Signed)
There has been no changes to your medications.  Labs done today, your results will be available in MyChart, we will contact you for abnormal readings.  Your physician recommends that you schedule a follow-up appointment in: 1 year ( October 2024)  ** please call the office in July 2024 to arrange your follow up appointment **  If you have any questions or concerns before your next appointment please send [place] a message through Tina or call our office at [phone].    TO LEAVE A MESSAGE FOR THE NURSE SELECT OPTION 2, PLEASE LEAVE A MESSAGE INCLUDING: YOUR NAME DATE OF BIRTH CALL BACK NUMBER REASON FOR CALL**this is important as we prioritize the call backs  YOU WILL RECEIVE A CALL BACK THE SAME DAY AS LONG AS YOU CALL BEFORE 4:00 PM  At the [facility], you and your health needs are our priority. As part of our continuing mission to provide you with exceptional heart care, we have created designated Provider Care Teams. These Care Teams include your primary Cardiologist (physician) and Advanced Practice Providers (APPs- Physician Assistants and Nurse Practitioners) who all work together to provide you with the care you need, when you need it.   You may see any of the following providers on your designated Care Team at your next follow up: Dr Glori Bickers Dr Loralie Champagne Dr. Roxana Hires, NP Lyda Jester, [place] [facility] [place], [place] Forestine Na, NP Audry Riles, PharmD   Please be sure to bring in all your medications bottles to every appointment.

## 2021-12-04 ENCOUNTER — Ambulatory Visit (INDEPENDENT_AMBULATORY_CARE_PROVIDER_SITE_OTHER): Payer: Medicare Other

## 2021-12-04 DIAGNOSIS — Z7901 Long term (current) use of anticoagulants: Secondary | ICD-10-CM

## 2021-12-04 LAB — POCT INR: INR: 2.9 (ref 2.0–3.0)

## 2021-12-04 NOTE — Progress Notes (Signed)
Continue 1 tablet daily except 1/2 tablet on Mondays and Thursdays.  Recheck in 5 weeks.

## 2021-12-04 NOTE — Patient Instructions (Signed)
Continue 1 tablet daily except 1/2 tablet on Mondays and Thursdays.  Recheck in 5 weeks.

## 2021-12-16 ENCOUNTER — Other Ambulatory Visit: Payer: Self-pay | Admitting: *Deleted

## 2021-12-16 DIAGNOSIS — I5022 Chronic systolic (congestive) heart failure: Secondary | ICD-10-CM

## 2021-12-16 MED ORDER — LOSARTAN POTASSIUM 25 MG PO TABS: 25.0000 mg | ORAL_TABLET | Freq: Every day | ORAL | 3 refills | Status: DC

## 2022-01-08 ENCOUNTER — Ambulatory Visit (INDEPENDENT_AMBULATORY_CARE_PROVIDER_SITE_OTHER): Payer: Medicare Other

## 2022-01-08 ENCOUNTER — Ambulatory Visit: Payer: Medicare Other

## 2022-01-08 DIAGNOSIS — Z7901 Long term (current) use of anticoagulants: Secondary | ICD-10-CM

## 2022-01-08 LAB — POCT INR: INR: 3.1 — AB (ref 2.0–3.0)

## 2022-01-08 NOTE — Patient Instructions (Addendum)
Eat a serving of green leafy vegetables today.  Continue 1 tablet daily except 1/2 tablet on Mondays and Thursdays.  Recheck on 02/12/22 at 11:00.

## 2022-01-08 NOTE — Progress Notes (Signed)
Instructed pt to eat a serving of green leafy vegetables today. Verbalized understanding   Continue 1 tablet daily except 1/2 tablet on Mondays and Thursdays.  Recheck in 5 weeks.

## 2022-01-13 ENCOUNTER — Telehealth: Payer: Self-pay | Admitting: Family Medicine

## 2022-01-13 ENCOUNTER — Other Ambulatory Visit: Payer: Self-pay

## 2022-01-13 ENCOUNTER — Inpatient Hospital Stay (HOSPITAL_COMMUNITY)
Admission: EM | Admit: 2022-01-13 | Discharge: 2022-01-17 | DRG: 378 | Disposition: A | Discharge status: Home / Self Care | Payer: Medicare Other | Attending: Internal Medicine | Admitting: Internal Medicine

## 2022-01-13 DIAGNOSIS — Z79899 Other long term (current) drug therapy: Secondary | ICD-10-CM

## 2022-01-13 DIAGNOSIS — I700 Atherosclerosis of aorta: Secondary | ICD-10-CM | POA: Diagnosis not present

## 2022-01-13 DIAGNOSIS — E119 Type 2 diabetes mellitus without complications: Secondary | ICD-10-CM | POA: Diagnosis present

## 2022-01-13 DIAGNOSIS — K219 Gastro-esophageal reflux disease without esophagitis: Secondary | ICD-10-CM | POA: Diagnosis present

## 2022-01-13 DIAGNOSIS — K635 Polyp of colon: Secondary | ICD-10-CM

## 2022-01-13 DIAGNOSIS — D122 Benign neoplasm of ascending colon: Secondary | ICD-10-CM | POA: Diagnosis not present

## 2022-01-13 DIAGNOSIS — I10 Essential (primary) hypertension: Secondary | ICD-10-CM | POA: Diagnosis present

## 2022-01-13 DIAGNOSIS — E872 Acidosis, unspecified: Secondary | ICD-10-CM | POA: Diagnosis present

## 2022-01-13 DIAGNOSIS — I130 Hypertensive heart and chronic kidney disease with heart failure and stage 1 through stage 4 chronic kidney disease, or unspecified chronic kidney disease: Secondary | ICD-10-CM | POA: Diagnosis present

## 2022-01-13 DIAGNOSIS — N400 Enlarged prostate without lower urinary tract symptoms: Secondary | ICD-10-CM | POA: Diagnosis not present

## 2022-01-13 DIAGNOSIS — E1169 Type 2 diabetes mellitus with other specified complication: Secondary | ICD-10-CM | POA: Diagnosis not present

## 2022-01-13 DIAGNOSIS — E1122 Type 2 diabetes mellitus with diabetic chronic kidney disease: Secondary | ICD-10-CM | POA: Diagnosis not present

## 2022-01-13 DIAGNOSIS — I5042 Chronic combined systolic (congestive) and diastolic (congestive) heart failure: Secondary | ICD-10-CM | POA: Diagnosis not present

## 2022-01-13 DIAGNOSIS — N1832 Chronic kidney disease, stage 3b: Secondary | ICD-10-CM | POA: Diagnosis not present

## 2022-01-13 DIAGNOSIS — I2510 Atherosclerotic heart disease of native coronary artery without angina pectoris: Secondary | ICD-10-CM | POA: Diagnosis present

## 2022-01-13 DIAGNOSIS — Z87442 Personal history of urinary calculi: Secondary | ICD-10-CM

## 2022-01-13 DIAGNOSIS — I513 Intracardiac thrombosis, not elsewhere classified: Secondary | ICD-10-CM | POA: Diagnosis not present

## 2022-01-13 DIAGNOSIS — Z8249 Family history of ischemic heart disease and other diseases of the circulatory system: Secondary | ICD-10-CM

## 2022-01-13 DIAGNOSIS — K921 Melena: Secondary | ICD-10-CM | POA: Diagnosis not present

## 2022-01-13 DIAGNOSIS — E785 Hyperlipidemia, unspecified: Secondary | ICD-10-CM | POA: Diagnosis present

## 2022-01-13 DIAGNOSIS — F419 Anxiety disorder, unspecified: Secondary | ICD-10-CM | POA: Diagnosis present

## 2022-01-13 DIAGNOSIS — K922 Gastrointestinal hemorrhage, unspecified: Secondary | ICD-10-CM | POA: Diagnosis present

## 2022-01-13 DIAGNOSIS — K5731 Diverticulosis of large intestine without perforation or abscess with bleeding: Principal | ICD-10-CM | POA: Diagnosis present

## 2022-01-13 DIAGNOSIS — Z7722 Contact with and (suspected) exposure to environmental tobacco smoke (acute) (chronic): Secondary | ICD-10-CM | POA: Diagnosis present

## 2022-01-13 DIAGNOSIS — I358 Other nonrheumatic aortic valve disorders: Secondary | ICD-10-CM | POA: Diagnosis present

## 2022-01-13 DIAGNOSIS — Z86010 Personal history of colonic polyps: Secondary | ICD-10-CM

## 2022-01-13 DIAGNOSIS — K648 Other hemorrhoids: Secondary | ICD-10-CM | POA: Diagnosis not present

## 2022-01-13 DIAGNOSIS — Z7901 Long term (current) use of anticoagulants: Secondary | ICD-10-CM

## 2022-01-13 DIAGNOSIS — Z951 Presence of aortocoronary bypass graft: Secondary | ICD-10-CM

## 2022-01-13 DIAGNOSIS — Z833 Family history of diabetes mellitus: Secondary | ICD-10-CM

## 2022-01-13 DIAGNOSIS — R791 Abnormal coagulation profile: Secondary | ICD-10-CM | POA: Diagnosis not present

## 2022-01-13 DIAGNOSIS — Z7984 Long term (current) use of oral hypoglycemic drugs: Secondary | ICD-10-CM

## 2022-01-13 DIAGNOSIS — I4891 Unspecified atrial fibrillation: Secondary | ICD-10-CM | POA: Diagnosis not present

## 2022-01-13 DIAGNOSIS — Z880 Allergy status to penicillin: Secondary | ICD-10-CM

## 2022-01-13 DIAGNOSIS — N179 Acute kidney failure, unspecified: Secondary | ICD-10-CM | POA: Diagnosis not present

## 2022-01-13 DIAGNOSIS — Z95810 Presence of automatic (implantable) cardiac defibrillator: Secondary | ICD-10-CM

## 2022-01-13 DIAGNOSIS — E1151 Type 2 diabetes mellitus with diabetic peripheral angiopathy without gangrene: Secondary | ICD-10-CM | POA: Diagnosis not present

## 2022-01-13 DIAGNOSIS — I252 Old myocardial infarction: Secondary | ICD-10-CM | POA: Diagnosis not present

## 2022-01-13 DIAGNOSIS — Z9049 Acquired absence of other specified parts of digestive tract: Secondary | ICD-10-CM

## 2022-01-13 LAB — COMPREHENSIVE METABOLIC PANEL
ALT: 19 U/L (ref 0–44)
AST: 20 U/L (ref 15–41)
Albumin: 4 g/dL (ref 3.5–5.0)
Alkaline Phosphatase: 51 U/L (ref 38–126)
Anion gap: 9 (ref 5–15)
BUN: 42 mg/dL — ABNORMAL HIGH (ref 8–23)
CO2: 24 mmol/L (ref 22–32)
Calcium: 9.4 mg/dL (ref 8.9–10.3)
Chloride: 109 mmol/L (ref 98–111)
Creatinine, Ser: 1.43 mg/dL — ABNORMAL HIGH (ref 0.61–1.24)
GFR, Estimated: 47 mL/min — ABNORMAL LOW (ref >60)
Glucose, Bld: 151 mg/dL — ABNORMAL HIGH (ref 70–99)
Potassium: 4.2 mmol/L (ref 3.5–5.1)
Sodium: 142 mmol/L (ref 135–145)
Total Bilirubin: 0.7 mg/dL (ref 0.3–1.2)
Total Protein: 7.2 g/dL (ref 6.5–8.1)

## 2022-01-13 LAB — CBC
HCT: 43.8 % (ref 39.0–52.0)
Hemoglobin: 14.3 g/dL (ref 13.0–17.0)
MCH: 31.5 pg (ref 26.0–34.0)
MCHC: 32.6 g/dL (ref 30.0–36.0)
MCV: 96.5 fL (ref 80.0–100.0)
Platelets: 163 10*3/uL (ref 150–400)
RBC: 4.54 MIL/uL (ref 4.22–5.81)
RDW: 12.6 % (ref 11.5–15.5)
WBC: 9.4 10*3/uL (ref 4.0–10.5)
nRBC: 0 % (ref 0.0–0.2)

## 2022-01-13 LAB — PROTIME-INR
INR: 2.4 — ABNORMAL HIGH (ref 0.8–1.2)
Prothrombin Time: 25.6 seconds — ABNORMAL HIGH (ref 11.4–15.2)

## 2022-01-13 LAB — TYPE AND SCREEN
ABO/RH(D): O POS
Antibody Screen: NEGATIVE

## 2022-01-13 LAB — CBG MONITORING, ED: Glucose-Capillary: 129 mg/dL — ABNORMAL HIGH (ref 70–99)

## 2022-01-13 LAB — POC OCCULT BLOOD, ED: Fecal Occult Bld: POSITIVE — AB

## 2022-01-13 LAB — HEMOGLOBIN AND HEMATOCRIT, BLOOD
HCT: 36.8 % — ABNORMAL LOW (ref 39.0–52.0)
Hemoglobin: 12.1 g/dL — ABNORMAL LOW (ref 13.0–17.0)

## 2022-01-13 MED ORDER — LOSARTAN POTASSIUM 25 MG PO TABS
25.0000 mg | ORAL_TABLET | Freq: Every day | ORAL | Status: DC
  Administered 2022-01-13 – 2022-01-16 (×4): 25 mg via ORAL
  Filled 2022-01-13 (×4): qty 1

## 2022-01-13 MED ORDER — SIMVASTATIN 20 MG PO TABS
20.0000 mg | ORAL_TABLET | Freq: Every day | ORAL | Status: DC
  Administered 2022-01-13 – 2022-01-16 (×4): 20 mg via ORAL
  Filled 2022-01-13 (×4): qty 1

## 2022-01-13 MED ORDER — ACETAMINOPHEN 650 MG RE SUPP: 650.0000 mg | Freq: Four times a day (QID) | RECTAL | Status: DC | PRN

## 2022-01-13 MED ORDER — ONDANSETRON HCL 4 MG PO TABS: 4.0000 mg | ORAL_TABLET | Freq: Four times a day (QID) | ORAL | Status: DC | PRN

## 2022-01-13 MED ORDER — TAMSULOSIN HCL 0.4 MG PO CAPS
0.4000 mg | ORAL_CAPSULE | Freq: Every day | ORAL | Status: DC
  Administered 2022-01-13 – 2022-01-16 (×4): 0.4 mg via ORAL
  Filled 2022-01-13 (×4): qty 1

## 2022-01-13 MED ORDER — FUROSEMIDE 40 MG PO TABS: 40.0000 mg | ORAL_TABLET | Freq: Every day | ORAL | Status: DC | PRN

## 2022-01-13 MED ORDER — MELATONIN 5 MG PO TABS
5.0000 mg | ORAL_TABLET | Freq: Every day | ORAL | Status: DC
  Administered 2022-01-13 – 2022-01-16 (×4): 5 mg via ORAL
  Filled 2022-01-13 (×4): qty 1

## 2022-01-13 MED ORDER — PANTOPRAZOLE INFUSION (NEW) - SIMPLE MED
8.0000 mg/h | INTRAVENOUS | Status: DC
  Administered 2022-01-13 – 2022-01-14 (×2): 8 mg/h via INTRAVENOUS
  Filled 2022-01-13 (×2): qty 100
  Filled 2022-01-13: qty 80

## 2022-01-13 MED ORDER — ACETAMINOPHEN 325 MG PO TABS: 650.0000 mg | ORAL_TABLET | Freq: Four times a day (QID) | ORAL | Status: DC | PRN

## 2022-01-13 MED ORDER — SPIRONOLACTONE 12.5 MG HALF TABLET
12.5000 mg | ORAL_TABLET | Freq: Every day | ORAL | Status: DC
  Administered 2022-01-14 – 2022-01-16 (×3): 12.5 mg via ORAL
  Filled 2022-01-13 (×3): qty 1

## 2022-01-13 MED ORDER — LACTATED RINGERS IV SOLN: INTRAVENOUS | Status: AC

## 2022-01-13 MED ORDER — ONDANSETRON HCL 4 MG/2ML IJ SOLN: 4.0000 mg | Freq: Four times a day (QID) | INTRAMUSCULAR | Status: DC | PRN

## 2022-01-13 MED ORDER — PANTOPRAZOLE 80MG IVPB - SIMPLE MED
80.0000 mg | Freq: Once | INTRAVENOUS | Status: AC
  Administered 2022-01-13: 80 mg via INTRAVENOUS
  Filled 2022-01-13: qty 80

## 2022-01-13 MED ORDER — CARVEDILOL 3.125 MG PO TABS
3.1250 mg | ORAL_TABLET | Freq: Two times a day (BID) | ORAL | Status: DC
  Administered 2022-01-13 – 2022-01-17 (×8): 3.125 mg via ORAL
  Filled 2022-01-13 (×8): qty 1

## 2022-01-13 MED ORDER — LACTATED RINGERS IV SOLN: INTRAVENOUS | Status: DC

## 2022-01-13 MED ORDER — TIZANIDINE HCL 4 MG PO TABS: 2.0000 mg | ORAL_TABLET | Freq: Three times a day (TID) | ORAL | Status: DC | PRN

## 2022-01-13 MED ORDER — METFORMIN HCL ER 500 MG PO TB24
500.0000 mg | ORAL_TABLET | Freq: Every day | ORAL | Status: DC
  Administered 2022-01-14 – 2022-01-17 (×2): 500 mg via ORAL
  Filled 2022-01-13 (×4): qty 1

## 2022-01-13 MED ORDER — FOLIC ACID 1 MG PO TABS
500.0000 ug | ORAL_TABLET | Freq: Every day | ORAL | Status: DC
  Administered 2022-01-13 – 2022-01-17 (×5): 0.5 mg via ORAL
  Filled 2022-01-13 (×5): qty 1

## 2022-01-13 NOTE — Telephone Encounter (Signed)
Patient currently at [facility]

## 2022-01-13 NOTE — Telephone Encounter (Signed)
Patient Name: Joseph Malone Gender: Male DOB: 07/30/1930 Age: 88 Y 11 M 2 D Return Phone Number: [phone] (Primary) Address: City/ State/ Zip: [place]  [address] Client [facility] [place] Client Site [facility] [place] Day Provider Garret Reddish- MD Contact Type Call Who Is Calling Patient / Member / Family / Caregiver Call Type Triage / Clinical Relationship To Patient Self Return Phone Number [phone] (Primary) Chief Complaint Rectal Bleeding Reason for Call Symptomatic / Request for Health Information Initial Comment Caller states rectum started bleeding, stool is dark, black, tar, has red on toilet paper when wiping. Translation No Nurse Assessment Nurse: Fredderick Phenix, RN, Lelan Pons Date/Time Eilene Ghazi Time): 01/13/2022 8:24:41 AM Confirm and document reason for call. If symptomatic, describe symptoms. ---Caller states rectum started bleeding, stool is dark, black, tar, has red on toilet paper when wiping. Taking Coumadin. Started on Saturday. States he's been eating leafy greens since Wednesday. Does the patient have any new or worsening symptoms? ---Yes Will a triage be completed? ---Yes Related visit to physician within the last 2 weeks? ---Yes Does the PT have any chronic conditions? (i.e. diabetes, asthma, this includes High risk factors for pregnancy, etc.) ---Yes List chronic conditions. ---Cardiac hx, HTN Is this a behavioral health or substance abuse call? ---No Guidelines Guideline Title Affirmed Question Affirmed Notes Nurse Date/Time Eilene Ghazi Time) Rectal Bleeding [1] MODERATE rectal bleeding (small blood clots, passing blood without stool, or toilet water turns red) AND [2] more than once a day Stormy Card 01/13/2022 8:27:23 AM  Disp. Time Eilene Ghazi Time) Disposition Final User 01/13/2022 8:30:11 AM Go to ED Now Yes Fredderick Phenix, RN, Lelan Pons Final Disposition 01/13/2022 8:30:11 AM Go to ED  Now Yes Fredderick Phenix, RN, [place] Disagree/Comply Comply Caller Understands Yes PreDisposition Did not know what to do Care Advice Given Per Guideline GO TO ED NOW: * You need to be seen in the Emergency Department. * Leave now. Drive carefully. NOTE TO TRIAGER - DRIVING: * Another adult should drive. CARE ADVICE given per Rectal Bleeding (Adult) guideline. Referrals [facility] - ED

## 2022-01-13 NOTE — ED Provider Notes (Signed)
[facility] DEPT Provider Note   CSN: [id number] Arrival date & time: 01/13/22  4098     History  Chief Complaint  Patient presents with   Rectal Bleeding    MIACHEL NARDELLI is a 88 y.o. male.  88 year old male with a history of CAD status post CABG, CHF, apical thrombus on Coumadin, and ICD who presents the emergency department with rectal bleeding.  On Saturday started having melanotic stool.  Typically 1/day.  Today noticed that he had bright red blood per rectum streaked his toilet paper.  No dizziness, chest pain, shortness of breath recently.  No bleeding elsewhere.  Mild epigastric abdominal pain.  Did have an EGD in 2020 when he had similar symptoms that showed an AVM that was intervened upon.  Denies any heavy alcohol intake or NSAID use.       Home Medications Prior to Admission medications   Medication Sig Start Date End Date Taking? Authorizing Provider  acetaminophen (TYLENOL) 500 MG tablet Take 500 mg by mouth at bedtime.    Yes [provider]  aspirin 81 MG tablet Take 1 tablet (81 mg total) by mouth every evening. 12/28/13  Yes Bensimhon, Shaune Pascal, MD  carvedilol (COREG) 3.125 MG tablet Take 3.125 mg by mouth 2 (two) times daily with a meal.   Yes [provider]  Cholecalciferol (VITAMIN D3) 2000 UNITS TABS Take 2,000 Units by mouth daily.   Yes [provider]  diphenhydramine-acetaminophen (TYLENOL PM) 25-500 MG TABS tablet Take 1 tablet by mouth at bedtime as needed (For sleep).   Yes [provider]  folic acid (FOLVITE) 119 MCG tablet Take 400 mcg by mouth daily.   Yes [provider]  furosemide (LASIX) 40 MG tablet Take 1 tablet (40 mg total) by mouth daily as needed. Patient taking differently: Take 40 mg by mouth daily as needed for fluid. 10/11/21  Yes Bensimhon, Shaune Pascal, MD  Glucosamine HCl 1500 MG TABS Take 1,500 mg by mouth every evening.    Yes [provider]  losartan  (COZAAR) 25 MG tablet Take 1 tablet (25 mg total) by mouth daily. Please schedule an appointment for further refills Patient taking differently: Take 25 mg by mouth daily. 12/16/21 03/16/22 Yes Bensimhon, Shaune Pascal, MD  Melatonin 5 MG CHEW Chew 1 tablet by mouth daily as needed.   Yes [provider]  metFORMIN (GLUCOPHAGE-XR) 500 MG 24 hr tablet TAKE 1 TABLET(500 MG) BY MOUTH DAILY WITH BREAKFAST Patient taking differently: Take 500 mg by mouth daily with breakfast. 09/26/21  Yes Marin Olp, MD  Multiple Vitamin (MULTIVITAMIN WITH MINERALS) TABS Take 1 tablet by mouth daily.    Yes [provider]  simvastatin (ZOCOR) 40 MG tablet Take 0.5 tablets (20 mg total) by mouth at bedtime. 02/14/21  Yes Bensimhon, Shaune Pascal, MD  spironolactone (ALDACTONE) 25 MG tablet Take 0.5 tablets (12.5 mg total) by mouth daily. 09/09/21  Yes Marin Olp, MD  tamsulosin (FLOMAX) 0.4 MG CAPS capsule Take 0.4 mg by mouth at bedtime. 12/13/19  Yes [provider]  tiZANidine (ZANAFLEX) 2 MG tablet TAKE 1 TABLET(2 MG) BY MOUTH DAILY AS NEEDED FOR MUSCLE SPASMS. DO NOT DRIVE FOR 8 HOURS AFTER USE Patient taking differently: Take 2 mg by mouth every 6 (six) hours as needed for muscle spasms. 06/20/21  Yes Marin Olp, MD  triamcinolone cream (KENALOG) 0.1 % Apply 1 Application topically daily as needed (Foot rash). 10/03/20  Yes [provider]  warfarin (COUMADIN) 5 MG tablet TAKE ONE TABLET BY MOUTH ON MONDAY, WEDNESDAY, AND FRIDAY AND TAKE ONE-HALF TABLET ALL OTHER DAYS OR AS DIRECTED Patient taking differently: Take 2.5-5 mg by mouth See admin instructions. Take 5 mg tablet by mouth every day except on Mon and Thursdays take 2.5 mg by mouth per patient 10/01/21  Yes Marin Olp, MD  Blood Glucose Monitoring Suppl (ONE TOUCH ULTRA 2) w/Device KIT Use to test blood sugars daily. Dx: E11.9 04/08/21   Marin Olp, MD  glucose blood test strip Use to test blood sugars daily.  Dx: E11.9 04/02/21   Marin Olp, MD  Lancets [facility] DELICA PLUS [id number]) MISC Use to test blood sugars daily. Dx: E11.9 04/18/21   Marin Olp, MD  OneTouch Delica Lancets 97C MISC USE TO TEST BLOOD SUGAR DAILY 04/24/21   Marin Olp, MD      Allergies    Penicillins    Review of Systems   Review of Systems  Physical Exam Updated Vital Signs BP 132/82   Pulse 65   Temp 98.7 F (37.1 C) (Oral)   Resp (!) 21   SpO2 97%  Physical Exam Vitals and nursing note reviewed.  Constitutional:      General: He is not in acute distress.    Appearance: He is well-developed.  HENT:     Head: Normocephalic and atraumatic.     Right Ear: External ear normal.     Left Ear: External ear normal.     Nose: Nose normal.  Eyes:     Extraocular Movements: Extraocular movements intact.     Conjunctiva/sclera: Conjunctivae normal.     Pupils: Pupils are equal, round, and reactive to light.  Cardiovascular:     Rate and Rhythm: Normal rate and regular rhythm.  Pulmonary:     Effort: Pulmonary effort is normal. No respiratory distress.  Abdominal:     General: There is no distension.     Palpations: Abdomen is soft. There is no mass.     Tenderness: There is no abdominal tenderness. There is no guarding.  Genitourinary:    Comments: Chaperoned by patient's tech. No external hemorrhoids or fissures noted on external inspection. No internal masses or hemorroids noted. Normal rectal tone. Bloody/melanotic stool noted.   Musculoskeletal:     Cervical back: Normal range of motion and neck supple.  Skin:    General: Skin is warm and dry.     Capillary Refill: Capillary refill takes 2 to 3 seconds.  Neurological:     Mental Status: He is alert. Mental status is at baseline.  Psychiatric:        Mood and Affect: Mood normal.        Behavior: Behavior normal.     ED Results / Procedures / Treatments   Labs (all labs ordered are listed, but only abnormal results are  displayed) Labs Reviewed  COMPREHENSIVE METABOLIC PANEL - Abnormal; Notable for the following components:      Result Value   Glucose, Bld 151 (*)    BUN 42 (*)    Creatinine, Ser 1.43 (*)    GFR, Estimated 47 (*)    All other components within normal limits  PROTIME-INR - Abnormal; Notable for the following components:   Prothrombin Time 25.6 (*)    INR 2.4 (*)    All other components within normal limits  HEMOGLOBIN AND HEMATOCRIT, BLOOD - Abnormal; Notable for the following components:   Hemoglobin 12.1 (*)  HCT 36.8 (*)    All other components within normal limits  POC OCCULT BLOOD, ED - Abnormal; Notable for the following components:   Fecal Occult Bld POSITIVE (*)    All other components within normal limits  CBG MONITORING, ED - Abnormal; Notable for the following components:   Glucose-Capillary 129 (*)    All other components within normal limits  CBC  CBC  COMPREHENSIVE METABOLIC PANEL  HEMOGLOBIN AND HEMATOCRIT, BLOOD  PROTIME-INR  POC OCCULT BLOOD, ED  TYPE AND SCREEN  ABO/RH    EKG None  Radiology No results found.  Procedures Procedures    Medications Ordered in ED Medications  pantoprozole (PROTONIX) 80 mg /NS 100 mL infusion (8 mg/hr Intravenous New Bag/Given 01/13/22 1843)  acetaminophen (TYLENOL) tablet 650 mg (has no administration in time range)    Or  acetaminophen (TYLENOL) suppository 650 mg (has no administration in time range)  ondansetron (ZOFRAN) tablet 4 mg (has no administration in time range)    Or  ondansetron (ZOFRAN) injection 4 mg (has no administration in time range)  lactated ringers infusion ( Intravenous New Bag/Given 01/13/22 1815)  carvedilol (COREG) tablet 3.125 mg (3.125 mg Oral Given 40/9/81 1914)  folic acid (FOLVITE) tablet 0.5 mg (0.5 mg Oral Given 01/13/22 2117)  furosemide (LASIX) tablet 40 mg (has no administration in time range)  losartan (COZAAR) tablet 25 mg (25 mg Oral Given 01/13/22 2117)  melatonin tablet 5  mg (5 mg Oral Given 01/13/22 2253)  simvastatin (ZOCOR) tablet 20 mg (20 mg Oral Given 01/13/22 2253)  spironolactone (ALDACTONE) tablet 12.5 mg (has no administration in time range)  tamsulosin (FLOMAX) capsule 0.4 mg (0.4 mg Oral Given 01/13/22 2117)  tiZANidine (ZANAFLEX) tablet 2 mg (has no administration in time range)  metFORMIN (GLUCOPHAGE-XR) 24 hr tablet 500 mg (has no administration in time range)  pantoprazole (PROTONIX) 80 mg /NS 100 mL IVPB (0 mg Intravenous Stopped 01/13/22 1841)    ED Course/ Medical Decision Making/ A&P Clinical Course as of 01/13/22 2333  Mon Jan 13, 2022  1713 Discussed with Dr. Olevia Bowens for admission [RP]  1734 Discussed with Dr Bryan Lemma from gi [RP]    Clinical Course User Index [RP] Fransico Meadow, MD                           Medical Decision Making Amount and/or Complexity of Data Reviewed Labs: ordered.  Risk Decision regarding hospitalization.   BRODAN GREWELL is a 88 y.o. male with comorbidities that complicate the patient evaluation including CAD status post CABG, CHF, apical thrombus on Coumadin, and ICD who presents the emergency department with rectal bleeding.  Initial Ddx:  Upper GI bleed, AVM, peptic ulcer disease, lower GI bleed, hemorrhoids  MDM:  With the patient's prior EGD and melena feel that he is likely having an upper GI bleed from his AVM.  Could also be from peptic ulcer disease.  Does not appear to be due to a lower GI bleed and does not have any hemorrhoids on exam.  Given his anticoagulation feel the patient will likely need to be admitted and we will check labs on him.  Plan:  Labs Coags Type and screen Protonix Hemoccult  ED Summary/Re-evaluation:  Patient was observed in the emergency department was stable.  Labs did not show significant anemia.  Hemoccult was positive. Was therapeutic on his Coumadin.  Given the fact that he is stable do not feel that we should  immediately reverse him.  Discussed this case  with GI who will see the patient in the morning.  Recommended Protonix and admission.  Discussed with Dr. Olevia Bowens for admission.  This patient presents to the ED for concern of complaints listed in HPI, this involves an extensive number of treatment options, and is a complaint that carries with it a high risk of complications and morbidity. Disposition including potential need for admission considered.   Dispo: Admit to Floor  Records reviewed Outpatient Clinic Notes The following labs were independently interpreted: CBC and show no acute abnormality I personally reviewed and interpreted cardiac monitoring: normal sinus rhythm  I personally reviewed and interpreted the pt's EKG: see above for interpretation  I have reviewed the patients home medications and made adjustments as needed Consults: Hospitalist and Gastroenterology  Final Clinical Impression(s) / ED Diagnoses Final diagnoses:  Acute GI bleeding    Rx / DC Orders ED Discharge Orders     None         Fransico Meadow, MD 01/13/22 2333

## 2022-01-13 NOTE — ED Triage Notes (Signed)
Pt reports rectal bleeding that started on Saturday. PT denies dizziness/lightheadedness. Pt reports taking warfarin. Pt has not taken dose today.

## 2022-01-13 NOTE — H&P (Signed)
History and Physical    Patient: Joseph Malone HTX:[id number] DOB: [birth year] DOA: 01/13/2022 DOS: the patient was seen and examined on 01/13/2022 PCP: Marin Olp, MD  Patient coming from: Home  Chief Complaint:  Chief Complaint  Patient presents with   Rectal Bleeding   HPI: Joseph Malone is a 88 y.o. 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  He had mild epigastric discomfort and nausea on Saturday.  He had diarrhea today.  He denied fever, chills, rhinorrhea, sore throat, wheezing or hemoptysis.  No chest pain, palpitations, diaphoresis, PND, orthopnea or pitting edema of the lower extremities. No flank pain, dysuria, frequency or hematuria.  No polyuria, polydipsia, polyphagia or blurred vision.   ED course: Initial vital signs were temperature 98.2 F, pulse 86, respiration 18, BP 120/77 mmHg O2 sat 95% on room air.  The patient received 80 mg of pantoprazole IV bolus followed by continuous pantoprazole infusion.  Lab work: Fecal occult blood was positive.  CBC is her white count 9.4, hemoglobin 14.3 g/dL platelets 163.  PT 25.6 and INR 2.4.  CMP with a glucose of 151, BUN 42 and creatinine 1.43 mg/dL.  Electrolytes and LFTs were normal.   Review of Systems: As mentioned in the history of present illness. All other systems reviewed and are negative. Past Medical History:  Diagnosis Date   Anxiety    BPH (benign prostatic hypertrophy)    CAD (coronary artery disease)    s/p CABG   CHF (congestive heart failure) (HCC)    Diverticulosis    Diverticulosis  of colon without hemorrhage 10/22/2007   Qualifier: Diagnosis of  By: Linna Darner MD, [name]     DOE (dyspnea on exertion)    Excess weight    Fasting hyperglycemia    Generalized weakness    GERD (gastroesophageal reflux disease)    Heart attack ([place]) 2001   Heart failure    CHF due to ischemic CM   History of kidney stones    Hyperlipidemia    Hypertension    Ischemic cardiomyopathy    Microscopic hematuria    [facility]   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   Past Surgical History:  Procedure Laterality Date   APPENDECTOMY     BIOPSY  10/21/2018   Procedure: BIOPSY;  Surgeon: Yetta Flock, MD;  Location: [facility];  Service: Gastroenterology;;   [place]  2005   [employer]; s/p ICD gen change and RV lead revision April 2015   CATARACT EXTRACTION  2008   bilateral with lens implant   COLONOSCOPY  2004   Tics; [facility]   CORONARY ARTERY BYPASS GRAFT  2000   1 vessel   CYSTOSCOPY  06/2012   Dr [name]   ESOPHAGOGASTRODUODENOSCOPY (EGD) WITH PROPOFOL N/A 10/21/2018   Procedure: ESOPHAGOGASTRODUODENOSCOPY (EGD) WITH PROPOFOL;  Surgeon: Yetta Flock, MD;  Location: [facility];  Service: Gastroenterology;  Laterality: N/A;   HOT HEMOSTASIS N/A 10/21/2018   Procedure: HOT HEMOSTASIS (ARGON PLASMA COAGULATION/BICAP);  Surgeon: Yetta Flock, MD;  Location: [facility];  Service: Gastroenterology;  Laterality: N/A;   IMPLANTABLE CARDIOVERTER DEFIBRILLATOR (ICD) GENERATOR  CHANGE N/A 06/01/2013   Procedure: ICD GENERATOR CHANGE;  Surgeon: [name], MD;  Location[facility] C[facility] LAB;  Service: Cardiovascular;  Laterality: N/A;   LEAD REVISION N/A 06/01/2013   Procedure: LEAD REVISION;  Surgeon: [name], MD;  Location: [facility] CATH LAB;  Service: Cardiovascular;  Laterality: N/A;   PILONIDAL CYST EXCISION     RIGHT/LEFT HEART CATH AND CORONARY/GRAFT ANGIOGRAPHY N/A 06/12/2017   Procedure:  RIGHT/LEFT HEART CATH AND CORONARY/GRAFT ANGIOGRAPHY;  Surgeon: [name], MD;  Location: [facility] CV LAB;  Service: Cardiovascular;  Laterality: N/A;   TRANSTHORACIC ECHOCARDIOGRAM  08/29/2005   Social History:  reports that he has never smoked. He has been exposed to tobacco smoke. He has never used smokeless tobacco. He reports that he does not drink alcohol and does not use drugs.  Allergies  Allergen Reactions   Penicillins Rash and Other (See Comments)    Because of a history of documented adverse serious drug reaction;Medi Alert bracelet  is recommended Has patient had a PCN reaction causing immediate rash, facial/tongue/throat swelling, SOB or lightheadedness with hypotension: No Has patient had a PCN reaction causing severe rash involving mucus membranes or skin necrosis: No Has patient had a PCN reaction that required hospitalization: No Has patient had a PCN reaction occurring within the last 10 years: No If all of the above answers are "NO", t    Family History  Problem Relation Age of Onset   Hypertension Father    Heart attack Father 84   Diabetes Brother        X 2   Nephrolithiasis Brother    Heart attack Brother 27   Heart attack Paternal Uncle 37   Sudden death Paternal Grandfather 90       ? heat stroke   Lung cancer Sister        "Asian lung cancer"   Stroke Neg Hx    Colon cancer Neg Hx    Esophageal cancer Neg Hx    Rectal cancer Neg Hx    Stomach cancer Neg Hx     Prior to Admission medications   Medication Sig Start Date End Date Taking? Authorizing Provider  acetaminophen (TYLENOL) 500 MG tablet Take 500 mg by mouth at bedtime.     [provider]  aspirin 81 MG tablet Take 1 tablet (81 mg total) by mouth every evening. 12/28/13   Bensimhon, Shaune Pascal, MD  Blood Glucose Monitoring Suppl (ONE TOUCH ULTRA 2) w/Device KIT Use to test blood sugars daily. Dx: E11.9 04/08/21   Marin Olp, MD  carvedilol (COREG) 3.125 MG tablet Take  3.125 mg by mouth 2 (two) times daily with a meal.    [provider]  Cholecalciferol (VITAMIN D3) 2000 UNITS TABS Take 2,000 Units by mouth daily.    [provider]  folic acid (FOLVITE) 725 MCG tablet Take 400 mcg by mouth daily.    [provider]  furosemide (LASIX) 40 MG tablet Take 1 tablet (40 mg total) by mouth daily as needed. 10/11/21   Bensimhon, Shaune Pascal, MD  Glucosamine HCl 1500 MG TABS Take 1,500 mg by mouth every evening.     [provider]  glucose blood test strip Use to test blood sugars daily. Dx: E11.9 04/02/21   Marin Olp, MD  Lancets [facility] DELICA PLUS [id number]) MISC Use to test blood sugars daily. Dx: E11.9 04/18/21   Marin Olp, MD  losartan (COZAAR) 25 MG tablet Take 1 tablet (  25 mg total) by mouth daily. Please schedule an appointment for further refills 12/16/21 03/16/22  Bensimhon, Shaune Pascal, MD  Melatonin 5 MG CHEW Chew by mouth.    [provider]  metFORMIN (GLUCOPHAGE-XR) 500 MG 24 hr tablet TAKE 1 TABLET(500 MG) BY MOUTH DAILY WITH BREAKFAST 09/26/21   Marin Olp, MD  Multiple Vitamin (MULTIVITAMIN WITH MINERALS) TABS Take 1 tablet by mouth daily.     [provider]  mupirocin ointment (BACTROBAN) 2 % Apply topically 2 (two) times daily as needed. 06/21/20   [provider]  OneTouch Delica Lancets 27X MISC USE TO TEST BLOOD SUGAR DAILY 04/24/21   Marin Olp, MD  simvastatin (ZOCOR) 40 MG tablet Take 0.5 tablets (20 mg total) by mouth at bedtime. 02/14/21   Bensimhon, Shaune Pascal, MD  spironolactone (ALDACTONE) 25 MG tablet Take 0.5 tablets (12.5 mg total) by mouth daily. 09/09/21   Marin Olp, MD  tamsulosin (FLOMAX) 0.4 MG CAPS capsule Take 0.4 mg by mouth at bedtime. 12/13/19   [provider]  tiZANidine (ZANAFLEX) 2 MG tablet TAKE 1 TABLET(2 MG) BY MOUTH DAILY AS NEEDED FOR MUSCLE SPASMS. DO NOT DRIVE FOR 8 HOURS AFTER USE 06/20/21   Marin Olp, MD   triamcinolone cream (KENALOG) 0.1 %  10/03/20   [provider]  warfarin (COUMADIN) 5 MG tablet TAKE ONE TABLET BY MOUTH ON MONDAY, WEDNESDAY, AND FRIDAY AND TAKE ONE-HALF TABLET ALL OTHER DAYS OR AS DIRECTED 10/01/21   Marin Olp, MD    Physical Exam: Vitals:   01/13/22 1018 01/13/22 1320 01/13/22 1659  BP: 120/77 116/81 (!) 140/94  Pulse: 86 76 84  Resp: _0 Temp: 98.2 F (36.8 C) 97.7 F (36.5 C) (!) 97.3 F (36.3 C)  TempSrc: Oral Oral Oral  SpO2: 95% 92% 94%   Physical Exam Vitals and nursing note reviewed.  Constitutional:      General: He is awake. He is not in acute distress.    Appearance: He is obese.  HENT:     Head: Normocephalic.     Nose: No rhinorrhea.     Mouth/Throat:     Mouth: Mucous membranes are moist.  Eyes:     General: No scleral icterus.    Pupils: Pupils are equal, round, and reactive to light.  Neck:     Vascular: No JVD.  Cardiovascular:     Rate and Rhythm: Normal rate and regular rhythm.     Heart sounds: S1 normal and S2 normal.  Pulmonary:     Effort: Pulmonary effort is normal.     Breath sounds: Normal breath sounds.  Abdominal:     General: Bowel sounds are normal. There is no distension.     Palpations: Abdomen is soft.     Tenderness: There is no abdominal tenderness. There is no guarding.  Musculoskeletal:     Cervical back: Neck supple.     Right lower leg: No edema.     Left lower leg: No edema.  Skin:    General: Skin is warm and dry.  Neurological:     General: No focal deficit present.     Mental Status: He is alert and oriented to person, place, and time.  Psychiatric:        Mood and Affect: Mood normal.        Behavior: Behavior normal. Behavior is cooperative.   Data Reviewed:  Results are pending, will review when available.  11/14/2021 echocardiogram  IMPRESSIONS    1. Left ventricular ejection fraction, by estimation, is 30 to 35%. The  left ventricle has moderately decreased  function. The left ventricle  demonstrates regional wall motion abnormalities (see scoring  diagram/findings for description). There is mild  concentric left ventricular hypertrophy. Left ventricular diastolic  parameters are consistent with Grade I diastolic dysfunction (impaired  relaxation). There is akinesis of the left ventricular, mid-apical  anteroseptal wall, anterior wall and apical  segment.   2. Right ventricular systolic function is normal. The right ventricular  size is normal.   3. The mitral valve is normal in structure. Mild mitral valve  regurgitation. No evidence of mitral stenosis.   4. The aortic valve is tricuspid. There is mild calcification of the  aortic valve. Aortic valve regurgitation is not visualized. Aortic valve  sclerosis/calcification is present, without any evidence of aortic  stenosis.   5. The inferior vena cava is normal in size with greater than 50%  respiratory variability, suggesting right atrial pressure of 3 mmHg.  Assessment and Plan: Principal Problem:   GI bleeding   Melena/hematochezia Admit to PCU/inpatient. Clear liquid diet. N.p.o. after midnight. Continue gentle/time-limited IV fluids. Continue pantoprazole infusion. Monitor H&H. Transfuse as needed. GI will be seeing in the morning.  Active Problems:   Apical mural thrombus Hold warfarin for now. Monitor PT and INR daily.    Essential hypertension Continue carvedilol 3.125 mg p.o. twice daily. Continue spironolactone 40 mg p.o. daily. Continue furosemide 40 mg p.o. daily. Continue losartan 25 mg p.o. daily. Monitor BP, HR, renal function electrolytes.    Chronic combined systolic and diastolic congestive heart failure (HCC)  No signs of decompensation. Continue beta-blocker, ARB and diuretics as above.    CAD in native artery Hold aspirin. Continue beta-blocker and statin.    BPH without obstruction/lower urinary tract symptoms Continue tamsulosin 0.4 mg p.o.  daily. Advised to avoid Tylenol PM and/or diphenhydramine.    DM (diabetes mellitus) type II, controlled,    with peripheral vascular disorder ([place]) Currently clear liquid diet. CBG monitoring before meals and bedtime. Continue metformin XR 500 mg with breakfast.    Hyperlipidemia associated with type 2 diabetes mellitus (HCC)   Atherosclerosis of aorta (HCC) Continue simvastatin 40 mg p.o. daily.     Advance Care Planning:   Code Status: Full Code   Consults: [facility].  Family Communication:   Severity of Illness: The appropriate patient status for this patient is INPATIENT. Inpatient status is judged to be reasonable and necessary in order to provide the required intensity of service to ensure the patient's safety. The patient's presenting symptoms, physical exam findings, and initial radiographic and laboratory data in the context of their chronic comorbidities is felt to place them at high risk for further clinical deterioration. Furthermore, it is not anticipated that the patient will be medically stable for discharge from the hospital within 2 midnights of admission.   * I certify that at the point of admission it is my clinical judgment that the patient will require inpatient hospital care spanning beyond 2 midnights from the point of admission due to high intensity of service, high risk for further deterioration and high frequency of surveillance required.*  Author: Reubin Milan, MD 01/13/2022 5:51 PM  For on call review [email or web address].   This document was prepared using Dragon voice recognition software and may contain some unintended transcription errors.

## 2022-01-13 NOTE — ED Provider Triage Note (Addendum)
Emergency Medicine Provider Triage Evaluation Note  Joseph Malone , a 88 y.o. male  was evaluated in triage.  Pt complains of rectal bleeding.  Started Saturday.  Had a bowel movement around 2 PM and noticed stool was black.  When he wiped tissue paper was bright red.  Movements are nonpainful.  Symptoms have been persistent since.  Denies dizziness or lightheadedness.  Denies chest pain.  Denies shortness of breath above his baseline.  No abdominal pain.  Patient is on warfarin. Review of Systems  Positive: See above Negative: See above  Physical Exam  BP 120/77 (BP Location: Right Arm)   Pulse 86   Temp 98.2 F (36.8 C) (Oral)   Resp 18   SpO2 95%  Gen:   Awake, no distress   Resp:  Normal effort  MSK:   Moves extremities without difficulty  Other:    Medical Decision Making  Medically screening exam initiated at 10:39 AM.  Appropriate orders placed.  Joseph Malone was informed that the remainder of the evaluation will be completed by another provider, this initial triage assessment does not replace that evaluation, and the importance of remaining in the ED until their evaluation is complete.  Work up started   Harriet Pho, PA-C 01/13/22 1041    Harriet Pho, PA-C 01/13/22 1043

## 2022-01-13 NOTE — Telephone Encounter (Signed)
Patient states beginning 01/11/22 his rectum started bleeding. Stool is very dark-blackish/tarry. Very red blood on toilet paper while wiping.  Transferred to Triage.

## 2022-01-13 NOTE — ED Notes (Signed)
Hospitalist bedside 

## 2022-01-14 DIAGNOSIS — K921 Melena: Secondary | ICD-10-CM | POA: Diagnosis not present

## 2022-01-14 DIAGNOSIS — K922 Gastrointestinal hemorrhage, unspecified: Principal | ICD-10-CM | POA: Diagnosis present

## 2022-01-14 LAB — COMPREHENSIVE METABOLIC PANEL
ALT: 19 U/L (ref 0–44)
AST: 17 U/L (ref 15–41)
Albumin: 3.7 g/dL (ref 3.5–5.0)
Alkaline Phosphatase: 46 U/L (ref 38–126)
Anion gap: 8 (ref 5–15)
BUN: 30 mg/dL — ABNORMAL HIGH (ref 8–23)
CO2: 21 mmol/L — ABNORMAL LOW (ref 22–32)
Calcium: 8.9 mg/dL (ref 8.9–10.3)
Chloride: 108 mmol/L (ref 98–111)
Creatinine, Ser: 1.23 mg/dL (ref 0.61–1.24)
GFR, Estimated: 56 mL/min — ABNORMAL LOW (ref >60)
Glucose, Bld: 113 mg/dL — ABNORMAL HIGH (ref 70–99)
Potassium: 3.9 mmol/L (ref 3.5–5.1)
Sodium: 137 mmol/L (ref 135–145)
Total Bilirubin: 0.8 mg/dL (ref 0.3–1.2)
Total Protein: 6.8 g/dL (ref 6.5–8.1)

## 2022-01-14 LAB — CBC
HCT: 40.3 % (ref 39.0–52.0)
Hemoglobin: 13.4 g/dL (ref 13.0–17.0)
MCH: 31.6 pg (ref 26.0–34.0)
MCHC: 33.3 g/dL (ref 30.0–36.0)
MCV: 95 fL (ref 80.0–100.0)
Platelets: 147 10*3/uL — ABNORMAL LOW (ref 150–400)
RBC: 4.24 MIL/uL (ref 4.22–5.81)
RDW: 12.6 % (ref 11.5–15.5)
WBC: 10 10*3/uL (ref 4.0–10.5)
nRBC: 0 % (ref 0.0–0.2)

## 2022-01-14 LAB — GLUCOSE, CAPILLARY: Glucose-Capillary: 118 mg/dL — ABNORMAL HIGH (ref 70–99)

## 2022-01-14 LAB — PROTIME-INR
INR: 2.4 — ABNORMAL HIGH (ref 0.8–1.2)
Prothrombin Time: 25.6 seconds — ABNORMAL HIGH (ref 11.4–15.2)

## 2022-01-14 LAB — CBG MONITORING, ED
Glucose-Capillary: 113 mg/dL — ABNORMAL HIGH (ref 70–99)
Glucose-Capillary: 143 mg/dL — ABNORMAL HIGH (ref 70–99)

## 2022-01-14 MED ORDER — PANTOPRAZOLE SODIUM 40 MG IV SOLR
40.0000 mg | Freq: Two times a day (BID) | INTRAVENOUS | Status: DC
  Administered 2022-01-14 – 2022-01-17 (×7): 40 mg via INTRAVENOUS
  Filled 2022-01-14 (×7): qty 10

## 2022-01-14 NOTE — Care Management CC44 (Signed)
Condition Code 44 Documentation Completed  Patient Details  Name: Joseph Malone MRN: [id number] Date of Birth: February 16, 1930   Condition Code 44 given:  Yes Patient signature on Condition Code 44 notice:  Yes Documentation of 2 MD's agreement:  Yes Code 44 added to claim:  Yes    Kimber Relic, LCSW 01/14/2022, 1:56 PM

## 2022-01-14 NOTE — Consult Note (Addendum)
Referring Provider: EDP Primary Care Physician:  Marin Olp, MD Primary Gastroenterologist:  Dr. Havery Moros  Reason for Consultation:  GI bleed  HPI: Joseph Malone is a 88 y.o. 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 He tells me that this started on Saturday night when he had an episode of melena on Saturday evening, says that it was very black in color.  Then he said that he saw red blood, but this was just on the toilet paper when he was wiping.  The dark stools and red blood continued so he called his PCP Monday (yesterday morning) and was told to go the ED.  Hemoglobin has been in normal range.  Has been started on PPI drip after given a bolus.  BUN was slightly elevated, but so his creatinine.  He was Hemoccult positive.  EGD 10/2018 as follows:  - Esophagogastric landmarks identified. - 1 cm hiatal hernia. - Normal esophagus - A few gastric erosions, no focal ulcers, slightly nodular mucosa in the antrum - biopsies taken to rule out H pylori - Normal stomach otherwise - A single non-bleeding angiodysplastic lesion in the duodenum. Treated with argon plasma coagulation (APC). - Mucosal changes in the duodenum - normal variant versus adenomatous change. Biopsied.  1. Duodenum, Biopsy - PEPTIC DUODENITIS. 2. Stomach, biopsy, intrabody - GASTRIC ANTRAL MUCOSA WITH MILD REACTIVE GASTROPATHY. - GASTRIC OXYNTIC MUCOSA WITH MILD CHRONIC GASTRITIS. - WARTHIN-STARRY STAIN IS NEGATIVE FOR HELICOBACTER PYLORI.  Last colonoscopy 2004, noted to have diverticulosis.   Past Medical History:  Diagnosis Date   Anxiety    BPH (benign prostatic  hypertrophy)    CAD (coronary artery disease)    s/p CABG   CHF (congestive heart failure) (HCC)    Diverticulosis    Diverticulosis of colon without hemorrhage 10/22/2007   Qualifier: Diagnosis of  By: Linna Darner MD, [name]     DOE (dyspnea on exertion)    Excess weight    Fasting hyperglycemia    Generalized weakness    GERD (gastroesophageal reflux disease)    Heart attack ([place]) 2001   Heart failure    CHF due to ischemic CM   History of kidney stones    Hyperlipidemia    Hypertension    Ischemic cardiomyopathy    Microscopic hematuria    [facility]   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    Past Surgical History:  Procedure Laterality Date   APPENDECTOMY     BIOPSY  10/21/2018   Procedure: BIOPSY;  Surgeon: Yetta Flock, MD;  Location: [facility];  Service: Gastroenterology;;   [place]  2005   [employer]; s/p ICD gen change and RV lead revision April 2015   CATARACT EXTRACTION  2008   bilateral with lens implant   COLONOSCOPY  2004   Tics; [facility]   CORONARY ARTERY BYPASS GRAFT  2000   1 vessel   CYSTOSCOPY  06/2012   Dr [name]   ESOPHAGOGASTRODUODENOSCOPY (EGD) WITH PROPOFOL N/A 10/21/2018   Procedure: ESOPHAGOGASTRODUODENOSCOPY (EGD) WITH PROPOFOL;  Surgeon: Yetta Flock, MD;  Location: [facility];  Service: Gastroenterology;  Laterality: N/A;   HOT HEMOSTASIS N/A 10/21/2018   Procedure: HOT HEMOSTASIS (ARGON PLASMA COAGULATION/BICAP);  Surgeon:  Armbruster, Carlota Raspberry, MD;  Location: [facility];  Service: Gastroenterology;  Laterality: N/A;   IMPLANTABLE CARDIOVERTER DEFIBRILLATOR (ICD) GENERATOR CHANGE N/A 06/01/2013   Procedure: ICD GENERATOR CHANGE;  Surgeon: [name], MD;  Location: [facility] CATH LAB;  Service: Cardiovascular;  Laterality: N/A;   LEAD REVISION N/A 06/01/2013   Procedure: LEAD REVISION;  Surgeon: [name], MD;  Location: [facility] CATH LAB;  Service:  Cardiovascular;  Laterality: N/A;   PILONIDAL CYST EXCISION     RIGHT/LEFT HEART CATH AND CORONARY/GRAFT ANGIOGRAPHY N/A 06/12/2017   Procedure: RIGHT/LEFT HEART CATH AND CORONARY/GRAFT ANGIOGRAPHY;  Surgeon: [name], MD;  Location: [facility] CV LAB;  Service: Cardiovascular;  Laterality: N/A;   TRANSTHORACIC ECHOCARDIOGRAM  08/29/2005    Prior to Admission medications   Medication Sig Start Date End Date Taking? Authorizing Provider  acetaminophen (TYLENOL) 500 MG tablet Take 500 mg by mouth at bedtime.    Yes [provider]  aspirin 81 MG tablet Take 1 tablet (81 mg total) by mouth every evening. 12/28/13  Yes Bensimhon, Shaune Pascal, MD  carvedilol (COREG) 3.125 MG tablet Take 3.125 mg by mouth 2 (two) times daily with a meal.   Yes [provider]  Cholecalciferol (VITAMIN D3) 2000 UNITS TABS Take 2,000 Units by mouth daily.   Yes [provider]  diphenhydramine-acetaminophen (TYLENOL PM) 25-500 MG TABS tablet Take 1 tablet by mouth at bedtime as needed (For sleep).   Yes [provider]  folic acid (FOLVITE) 638 MCG tablet Take 400 mcg by mouth daily.   Yes [provider]  furosemide (LASIX) 40 MG tablet Take 1 tablet (40 mg total) by mouth daily as needed. Patient taking differently: Take 40 mg by mouth daily as needed for fluid. 10/11/21  Yes Bensimhon, Shaune Pascal, MD  Glucosamine HCl 1500 MG TABS Take 1,500 mg by mouth every evening.    Yes [provider]  losartan (COZAAR) 25 MG tablet Take 1 tablet (25 mg total) by mouth daily. Please schedule an appointment for further refills Patient taking differently: Take 25 mg by mouth daily. 12/16/21 03/16/22 Yes Bensimhon, Shaune Pascal, MD  Melatonin 5 MG CHEW Chew 1 tablet by mouth daily as needed.   Yes [provider]  metFORMIN (GLUCOPHAGE-XR) 500 MG 24 hr tablet TAKE 1 TABLET(500 MG) BY MOUTH DAILY WITH BREAKFAST Patient taking differently: Take 500 mg by mouth daily with  breakfast. 09/26/21  Yes Marin Olp, MD  Multiple Vitamin (MULTIVITAMIN WITH MINERALS) TABS Take 1 tablet by mouth daily.    Yes [provider]  simvastatin (ZOCOR) 40 MG tablet Take 0.5 tablets (20 mg total) by mouth at bedtime. 02/14/21  Yes Bensimhon, Shaune Pascal, MD  spironolactone (ALDACTONE) 25 MG tablet Take 0.5 tablets (12.5 mg total) by mouth daily. 09/09/21  Yes Marin Olp, MD  tamsulosin (FLOMAX) 0.4 MG CAPS capsule Take 0.4 mg by mouth at bedtime. 12/13/19  Yes [provider]  tiZANidine (ZANAFLEX) 2 MG tablet TAKE 1 TABLET(2 MG) BY MOUTH DAILY AS NEEDED FOR MUSCLE SPASMS. DO NOT DRIVE FOR 8 HOURS AFTER USE Patient taking differently: Take 2 mg by mouth every 6 (six) hours as needed for muscle spasms. 06/20/21  Yes Marin Olp, MD  triamcinolone cream (KENALOG) 0.1 % Apply 1 Application topically daily as needed (Foot rash). 10/03/20  Yes [provider]  warfarin (COUMADIN) 5 MG tablet TAKE ONE TABLET BY MOUTH ON MONDAY, WEDNESDAY, AND FRIDAY AND TAKE ONE-HALF TABLET ALL  OTHER DAYS OR AS DIRECTED Patient taking differently: Take 2.5-5 mg by mouth See admin instructions. Take 5 mg tablet by mouth every day except on Mon and Thursdays take 2.5 mg by mouth per patient 10/01/21  Yes Marin Olp, MD  Blood Glucose Monitoring Suppl (ONE TOUCH ULTRA 2) w/Device KIT Use to test blood sugars daily. Dx: E11.9 04/08/21   Marin Olp, MD  glucose blood test strip Use to test blood sugars daily. Dx: E11.9 04/02/21   Marin Olp, MD  Lancets [facility] DELICA PLUS [id number]) MISC Use to test blood sugars daily. Dx: E11.9 04/18/21   Marin Olp, MD  OneTouch Delica Lancets 81L MISC USE TO TEST BLOOD SUGAR DAILY 04/24/21   Marin Olp, MD    Current Facility-Administered Medications  Medication Dose Route Frequency Provider Last Rate Last Admin   acetaminophen (TYLENOL) tablet 650 mg  650 mg Oral Q6H PRN Reubin Milan, MD       Or    acetaminophen (TYLENOL) suppository 650 mg  650 mg Rectal Q6H PRN Reubin Milan, MD       carvedilol (COREG) tablet 3.125 mg  3.125 mg Oral BID WC Reubin Milan, MD   3.125 mg at 57/26/20 3559   folic acid (FOLVITE) tablet 0.5 mg  500 mcg Oral Daily Reubin Milan, MD   0.5 mg at 01/14/22 7416   furosemide (LASIX) tablet 40 mg  40 mg Oral Daily PRN Reubin Milan, MD       losartan (COZAAR) tablet 25 mg  25 mg Oral Daily Reubin Milan, MD   25 mg at 01/14/22 3845   melatonin tablet 5 mg  5 mg Oral QHS Reubin Milan, MD   5 mg at 01/13/22 2253   metFORMIN (GLUCOPHAGE-XR) 24 hr tablet 500 mg  500 mg Oral Q breakfast Reubin Milan, MD   500 mg at 01/14/22 0908   ondansetron (ZOFRAN) tablet 4 mg  4 mg Oral Q6H PRN Reubin Milan, MD       Or   ondansetron [facility]) injection 4 mg  4 mg Intravenous Q6H PRN Reubin Milan, MD       pantoprozole (PROTONIX) 80 mg /NS 100 mL infusion  8 mg/hr Intravenous Continuous Reubin Milan, MD 10 mL/hr at 01/14/22 0901 8 mg/hr at 01/14/22 0901   simvastatin (ZOCOR) tablet 20 mg  20 mg Oral QHS Reubin Milan, MD   20 mg at 01/13/22 2253   spironolactone (ALDACTONE) tablet 12.5 mg  12.5 mg Oral Daily Reubin Milan, MD   12.5 mg at 01/14/22 3646   tamsulosin (FLOMAX) capsule 0.4 mg  0.4 mg Oral QHS Reubin Milan, MD   0.4 mg at 01/13/22 2117   tiZANidine (ZANAFLEX) tablet 2 mg  2 mg Oral Q8H PRN Reubin Milan, MD       Current Outpatient Medications  Medication Sig Dispense Refill   acetaminophen (TYLENOL) 500 MG tablet Take 500 mg by mouth at bedtime.      aspirin 81 MG tablet Take 1 tablet (81 mg total) by mouth every evening.     carvedilol (COREG) 3.125 MG tablet Take 3.125 mg by mouth 2 (two) times daily with a meal.     Cholecalciferol (VITAMIN D3) 2000 UNITS TABS Take 2,000 Units by mouth daily.     diphenhydramine-acetaminophen (TYLENOL PM) 25-500 MG TABS tablet Take 1 tablet by  mouth at bedtime as needed (For sleep).  folic acid (FOLVITE) 517 MCG tablet Take 400 mcg by mouth daily.     furosemide (LASIX) 40 MG tablet Take 1 tablet (40 mg total) by mouth daily as needed. (Patient taking differently: Take 40 mg by mouth daily as needed for fluid.) 30 tablet 3   Glucosamine HCl 1500 MG TABS Take 1,500 mg by mouth every evening.      losartan (COZAAR) 25 MG tablet Take 1 tablet (25 mg total) by mouth daily. Please schedule an appointment for further refills (Patient taking differently: Take 25 mg by mouth daily.) 30 tablet 3   Melatonin 5 MG CHEW Chew 1 tablet by mouth daily as needed.     metFORMIN (GLUCOPHAGE-XR) 500 MG 24 hr tablet TAKE 1 TABLET(500 MG) BY MOUTH DAILY WITH BREAKFAST (Patient taking differently: Take 500 mg by mouth daily with breakfast.) 30 tablet 5   Multiple Vitamin (MULTIVITAMIN WITH MINERALS) TABS Take 1 tablet by mouth daily.      simvastatin (ZOCOR) 40 MG tablet Take 0.5 tablets (20 mg total) by mouth at bedtime. 45 tablet 3   spironolactone (ALDACTONE) 25 MG tablet Take 0.5 tablets (12.5 mg total) by mouth daily. 90 tablet 3   tamsulosin (FLOMAX) 0.4 MG CAPS capsule Take 0.4 mg by mouth at bedtime.     tiZANidine (ZANAFLEX) 2 MG tablet TAKE 1 TABLET(2 MG) BY MOUTH DAILY AS NEEDED FOR MUSCLE SPASMS. DO NOT DRIVE FOR 8 HOURS AFTER USE (Patient taking differently: Take 2 mg by mouth every 6 (six) hours as needed for muscle spasms.) 90 tablet 0   triamcinolone cream (KENALOG) 0.1 % Apply 1 Application topically daily as needed (Foot rash).     warfarin (COUMADIN) 5 MG tablet TAKE ONE TABLET BY MOUTH ON MONDAY, WEDNESDAY, AND FRIDAY AND TAKE ONE-HALF TABLET ALL OTHER DAYS OR AS DIRECTED (Patient taking differently: Take 2.5-5 mg by mouth See admin instructions. Take 5 mg tablet by mouth every day except on Mon and Thursdays take 2.5 mg by mouth per patient) 100 tablet 1   Blood Glucose Monitoring Suppl (ONE TOUCH ULTRA 2) w/Device KIT Use to test blood  sugars daily. Dx: E11.9 1 kit 3   glucose blood test strip Use to test blood sugars daily. Dx: E11.9 100 each 12   Lancets (ONETOUCH DELICA PLUS [id number]) MISC Use to test blood sugars daily. Dx: E11.9 100 each 3   OneTouch Delica Lancets 62I MISC USE TO TEST BLOOD SUGAR DAILY 100 each 3    Allergies as of 01/13/2022 - Review Complete 01/13/2022  Allergen Reaction Noted   Penicillins Rash and Other (See Comments)     Family History  Problem Relation Age of Onset   Hypertension Father    Heart attack Father 40   Diabetes Brother        X 2   Nephrolithiasis Brother    Heart attack Brother 60   Heart attack Paternal Uncle 33   Sudden death Paternal Grandfather 62       ? heat stroke   Lung cancer Sister        "Asian lung cancer"   Stroke Neg Hx    Colon cancer Neg Hx    Esophageal cancer Neg Hx    Rectal cancer Neg Hx    Stomach cancer Neg Hx     Social History   Socioeconomic History   Marital status: Married    Spouse name: Not on file   Number of children: Not on file   Years of education:  Not on file   Highest education level: Not on file  Occupational History   Occupation: Retired  Tobacco Use   Smoking status: Never    Passive exposure: Yes   Smokeless tobacco: Never  Vaping Use   Vaping Use: Never used  Substance and Sexual Activity   Alcohol use: No   Drug use: No   Sexual activity: Never  Other Topics Concern   Not on file  Social History Narrative   Married 1957. 4 children 2 boys 2 girls. 15 grandkids.  4 greatgrandkids.       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       Hobbies: genealogy, travel      No HCPOA-advised to do this.    Social Determinants of Health   Financial Resource Strain: Low Risk  (09/06/2021)   Overall Financial Resource Strain (CARDIA)    Difficulty of Paying Living Expenses: Not hard at all  Food Insecurity: No Food Insecurity (09/06/2021)   Hunger Vital Sign    Worried  About Running Out of Food in the Last Year: Never true    Ran Out of Food in the Last Year: Never true  Transportation Needs: No Transportation Needs (09/06/2021)   PRAPARE - Hydrologist (Medical): No    Lack of Transportation (Non-Medical): No  Physical Activity: Sufficiently Active (09/06/2021)   Exercise Vital Sign    Days of Exercise per Week: 3 days    Minutes of Exercise per Session: 50 min  Stress: No Stress Concern Present (09/06/2021)   [place]    Feeling of Stress : Not at all  Social Connections: [place] (09/06/2021)   Social Connection and Isolation Panel [NHANES]    Frequency of Communication with Friends and Family: More than three times a week    Frequency of Social Gatherings with Friends and Family: More than three times a week    Attends Religious Services: More than 4 times per year    Active Member of [employer] or Organizations: Yes    Attends Archivist Meetings: 1 to 4 times per year    Marital Status: Married  Human resources officer Violence: Not At Risk (09/06/2021)   Humiliation, Afraid, Rape, and Kick questionnaire    Fear of Current or Ex-Partner: No    Emotionally Abused: No    Physically Abused: No    Sexually Abused: No    Review of Systems: ROS is O/W negative except as mentioned in HPI.  Physical Exam: Vital signs in last 24 hours: Temp:  [97.3 F (36.3 C)-98.7 F (37.1 C)] 98 F (36.7 C) (12/05 1008) Pulse Rate:  [61-86] 61 (12/05 0904) Resp:  [16-26] 16 (12/05 0904) BP: (106-151)/(62-124) 145/73 (12/05 0904) SpO2:  [92 %-98 %] 98 % (12/05 0904)   General:  Alert, Well-developed, well-nourished, pleasant and cooperative in NAD Head:  Normocephalic and atraumatic. Eyes:  Sclera clear, no icterus.  Conjunctiva pink. Ears:  Normal auditory acuity. Mouth:  No deformity or lesions.   Lungs:  Clear throughout to auscultation.  No wheezes,  crackles, or rhonchi.  Heart:  Regular rate and rhythm; no murmurs, clicks, rubs, or gallops. Abdomen:  Soft, non-distended.  BS present.  Non-tender. Rectal:  No external hemorrhoids noted.  Dried dark stool in the perianal area, frank red blood on exam glove.  Msk:  Symmetrical without gross deformities.  Pulses:  Normal pulses noted.  Extremities:  Without clubbing or edema. Neurologic:  Alert and oriented x 4;  grossly normal neurologically. Skin:  Intact without significant lesions or rashes. Psych:  Alert and cooperative. Normal mood and affect.  Intake/Output from previous day: 12/04 0701 - 12/05 0700 In: 100 [IV Piggyback:100] Out: -   Lab Results: Recent Labs    01/13/22 1039 01/13/22 1718 01/14/22 0432  WBC 9.4  --  10.0  HGB 14.3 12.1* 13.4  HCT 43.8 36.8* 40.3  PLT 163  --  147*   BMET Recent Labs    01/13/22 1039 01/14/22 0432  NA 142 137  K 4.2 3.9  CL 109 108  CO2 24 21*  GLUCOSE 151* 113*  BUN 42* 30*  CREATININE 1.43* 1.23  CALCIUM 9.4 8.9   LFT Recent Labs    01/14/22 0432  PROT 6.8  ALBUMIN 3.7  AST 17  ALT 19  ALKPHOS 46  BILITOT 0.8   PT/INR Recent Labs    01/13/22 1144 01/14/22 0432  LABPROT 25.6* 25.6*  INR 2.4* 2.4*   IMPRESSION:  *88 year old male presenting with GI bleeding.  Describes stools initially as being very black, with red color on the toilet paper upon wiping.  Heme positive in the ED.  On my exam today there is actually frank red blood on the exam glove, no stool per se.  No external hemorrhoids.  Hemoglobin still within normal range at 13.4 g this morning.  Differential includes diverticular bleed versus AVMs, versus malignancy, versus upper GI source although I am not sure about that as he is hemodynamically stable with normal hemoglobin.  I wonder if the dark stools will just old blood pooling within the colon. *Coumadin coagulopathy with INR 2.4  PLAN: -On PPI drip.  Can change to IV BID for now. -The tentative  plan will be for colonoscopy on Thursday once his INR has drifted.  He is agreeable to that if needed.  Can do EGD if we think necessary as well at the same time.  Will discuss with Dr. Carlean Purl. -Full liquids today then start clear liquids tomorrow AM. -Monitor Hgb and INR.  Laban Emperor. Zehr  01/14/2022, 10:14 AM    [facility] Attending   I have taken an interval history, reviewed the chart and examined the patient. I agree with the Advanced Practitioner's note, impression and recommendations.    He is having a GI bleed - think lower but upper not entirely excluded I suppose. Will let INR drift down and hopefully colonoscopy w/ possible EGD 12/7  The risks and benefits as well as alternatives of endoscopic procedure(s) have been discussed and reviewed. All questions answered. The patient agrees to proceed.

## 2022-01-14 NOTE — Progress Notes (Signed)
PROGRESS NOTE  DAREN YEAGLE  DOB: 01-18-1931  PCP: Marin Olp, MD PIR:[id number]  DOA: 01/13/2022  LOS: 1 day  Hospital Day: 2  Brief narrative: Joseph Malone is a 88 y.o. male with PMH significant for HTN, HLD, CAD/CABG, ischemic cardiomyopathy, chronic combined systolic and diastolic CHF, LV thrombus on Coumadin, GERD, BPH, anxiety, diverticulosis 12/4, patient presented to the ED with complaint of rectal bleeding.  2 days ago, he had an episode of melena followed by 3 episodes of hematochezia an associated epigastric discomfort.  Epigastric discomfort continued.  He started having diarrhea and hence came to the ED on 12/4.    In the ER, he was afebrile, hemodynamically stable Labs showed unremarkable CBC, BMP with BUN/creatinine elevated to 42/1.43, glucose elevated 151, INR therapeutic at 2.4 FOBT positive Patient was started on IV Protonix drip Admitted to [facility] consulted  Subjective: Patient was seen and examined this morning.  Pleasant elderly Caucasian male.  Lying on bed.  Not in distress. Noted a small drop of blood when he wiped this morning.  Denies history of hemorrhoids. repeat labs this morning with hemoglobin stable, creatinine improved, INR remains at 2.4  Assessment and plan: Acute GI bleeding Presented with melena, hematochezia and epigastric discomfort while on Coumadin FOBT positive  GI consult appreciated.  Clear liquid diet started.  Switch to IV PPI twice daily. Tentative plan to do colonoscopy +/- EGD on Thursday once INR is down to subtherapeutic level. Hemoglobin stable so far Aspirin and Coumadin on hold Recent Labs    03/11/21 1158 09/09/21 1206 01/13/22 1039 01/13/22 1718 01/14/22 0432  HGB 14.9 14.5 14.3 12.1* 13.4  MCV 91.9 93.2 96.5  --  95.0  VITAMINB12 282 825  --   --   --    History of apical mural thrombus Coumadin on hold.  INR this morning remains therapeutic at 2.4 Recent Labs  Lab 01/08/22 0000 01/13/22 1144  01/14/22 0432  INR 3.1* 2.4* 2.4*   Chronic combined systolic and diastolic congestive heart failure Essential hypertension CHF remains compensated at this time  Most recent echo from July 2023 with EF 30 to 35%, mild concentric LVH, grade 1 diastolic dysfunction, akinesis of the left ventricular mid apical anteroseptal wall, anterior wall and apical segment. PTA on carvedilol 3.125 mg p.o. twice daily, Aldactone 12.5 mg daily, losartan 25 mg daily, Lasix 40 mg daily PRN. Currently continued on all.    CAD/CABG PAD, HLD Aspirin on hold. Continue beta-blocker and statin.  Type 2 diabetes mellitus A1c controlled at 6.4 in July 2023 PTA on metformin which is currently on hold Currently on sliding scale insulin Accu-Cheks Recent Labs  Lab 01/13/22 1926 01/14/22 0755 01/14/22 1254  GLUCAP 129* 113* 143*   BPH  Continue tamsulosin 0.4 mg p.o. daily.  Goals of care   Code Status: Full Code    Mobility: Encourage ambulation.  Uses a walker at home  Infusions:     Scheduled Meds:  carvedilol  3.125 mg Oral BID WC   folic acid  630 mcg Oral Daily   losartan  25 mg Oral Daily   melatonin  5 mg Oral QHS   metFORMIN  500 mg Oral Q breakfast   pantoprazole (PROTONIX) IV  40 mg Intravenous Q12H   simvastatin  20 mg Oral QHS   spironolactone  12.5 mg Oral Daily   tamsulosin  0.4 mg Oral QHS    PRN meds: acetaminophen **OR** acetaminophen, furosemide, ondansetron **OR** ondansetron (ZOFRAN) IV,  tiZANidine   Skin assessment:     Nutritional status:  There is no height or weight on file to calculate BMI.          Diet:  Diet Order             Diet clear liquid Room service appropriate? Yes; Fluid consistency: Thin  Diet effective now           Diet full liquid Room service appropriate? Yes; Fluid consistency: Thin  Diet effective now                   DVT prophylaxis:  SCDs Start: 01/13/22 1716   Antimicrobials: None Fluid: None Consultants:  GI Family Communication: None at bedside  Status is: Observation  Continue in-hospital care because: Tentative plan of colonoscopy on Thursday Level of care: Telemetry   Dispo: The patient is from: Home              Anticipated d/c is to: Home hopefully              Patient currently is not medically stable to d/c.   Difficult to place patient No       Antimicrobials: Anti-infectives (From admission, onward)    None       Objective: Vitals:   01/14/22 1315 01/14/22 1414  BP: (!) 125/53   Pulse: 62   Resp: 17   Temp:  (!) 97.5 F (36.4 C)  SpO2: 94%     Intake/Output Summary (Last 24 hours) at 01/14/2022 1536 Last data filed at 01/13/2022 1841 Gross per 24 hour  Intake 100 ml  Output --  Net 100 ml   There were no vitals filed for this visit. Weight change:  There is no height or weight on file to calculate BMI.   Physical Exam: General exam: Pleasant, elderly Caucasian male.  Not in physical distress Skin: No rashes, lesions or ulcers. HEENT: Atraumatic, normocephalic, no obvious bleeding Lungs: Clear to auscultation bilaterally CVS: Regular rate and rhythm, no murmur GI/Abd soft, nontender, nondistended, bowel sound present CNS: Alert, awake, oriented x 3 Psychiatry: Mood appropriate Extremities: No pedal edema, no calf tenderness  Data Review: I have personally reviewed the laboratory data and studies available.  F/u labs ordered Unresulted Labs (From admission, onward)     Start     Ordered   01/14/22 0500  Protime-INR  Daily,   R      01/13/22 2236   01/13/22 2130  Hemoglobin and hematocrit, blood  Once-Timed,   TIMED        01/13/22 1718            Signed, Terrilee Croak, MD [facility] 01/14/2022

## 2022-01-14 NOTE — Care Management Obs Status (Signed)
[place] NOTIFICATION   Patient Details  Name: Joseph Malone MRN: [id number] Date of Birth: April 14, 1930   Medicare Observation Status Notification Given:  Yes    Kimber Relic, LCSW 01/14/2022, 1:56 PM

## 2022-01-15 ENCOUNTER — Encounter (HOSPITAL_COMMUNITY): Payer: Self-pay | Admitting: Internal Medicine

## 2022-01-15 DIAGNOSIS — K921 Melena: Secondary | ICD-10-CM | POA: Diagnosis not present

## 2022-01-15 LAB — GLUCOSE, CAPILLARY
Glucose-Capillary: 122 mg/dL — ABNORMAL HIGH (ref 70–99)
Glucose-Capillary: 149 mg/dL — ABNORMAL HIGH (ref 70–99)
Glucose-Capillary: 104 mg/dL — ABNORMAL HIGH (ref 70–99)
Glucose-Capillary: 102 mg/dL — ABNORMAL HIGH (ref 70–99)

## 2022-01-15 LAB — PROTIME-INR
INR: 2.2 — ABNORMAL HIGH (ref 0.8–1.2)
Prothrombin Time: 23.9 seconds — ABNORMAL HIGH (ref 11.4–15.2)

## 2022-01-15 MED ORDER — PEG-KCL-NACL-NASULF-NA ASC-C 100 G PO SOLR: 0.5000 | Freq: Once | ORAL | Status: DC

## 2022-01-15 MED ORDER — PEG-KCL-NACL-NASULF-NA ASC-C 100 G PO SOLR
0.5000 | Freq: Once | ORAL | Status: AC
  Administered 2022-01-15: 100 g via ORAL
  Filled 2022-01-15: qty 1

## 2022-01-15 MED ORDER — METOCLOPRAMIDE HCL 5 MG/ML IJ SOLN
10.0000 mg | Freq: Once | INTRAMUSCULAR | Status: AC
  Administered 2022-01-15: 10 mg via INTRAVENOUS
  Filled 2022-01-15: qty 2

## 2022-01-15 MED ORDER — BISACODYL 5 MG PO TBEC
20.0000 mg | DELAYED_RELEASE_TABLET | Freq: Once | ORAL | Status: AC
  Administered 2022-01-15: 20 mg via ORAL
  Filled 2022-01-15: qty 4

## 2022-01-15 MED ORDER — PEG-KCL-NACL-NASULF-NA ASC-C 100 G PO SOLR
0.5000 | Freq: Once | ORAL | Status: AC
  Administered 2022-01-15: 100 g via ORAL

## 2022-01-15 NOTE — Progress Notes (Signed)
   Patient Name: Joseph Malone Date of Encounter: 01/15/2022, 10:20 AM    Subjective  Reports a stool yesterday bloody but less so.  Maroon not melena no pain no problems.   Objective  BP (!) 171/96 (BP Location: Right Arm)   Pulse 66   Temp 97.6 F (36.4 C) (Oral)   Resp 18   SpO2 95%  Elderly white man no acute distress vigorous younger than stated age Lungs clear Heart S1-S2 no rubs murmurs or gallops Abdomen is obese soft and nontender bowel sounds are present but not increased     Latest Ref Rng & Units 01/14/2022    4:32 AM 01/13/2022    5:18 PM 01/13/2022   10:39 AM  CBC  WBC 4.0 - 10.5 K/uL 10.0   9.4   Hemoglobin 13.0 - 17.0 g/dL 13.4  12.1  14.3   Hematocrit 39.0 - 52.0 % 40.3  36.8  43.8   Platelets 150 - 400 K/uL 147   163    Lab Results  Component Value Date   INR 2.2 (H) 01/15/2022   INR 2.4 (H) 01/14/2022   INR 2.4 (H) 01/13/2022   Recent Labs  Lab 01/13/22 1039 01/14/22 0432  NA 142 137  K 4.2 3.9  CL 109 108  CO2 24 21*  GLUCOSE 151* 113*  BUN 42* 30*  CREATININE 1.43* 1.23  CALCIUM 9.4 8.9       Assessment and Plan  Hematochezia/blood in stool with bright red and maroon stools hemoglobin remains normal I forgot I am sorry that it is no urgency  Anticoagulation for atrial fibrillation INR 2.2 today   Plan is for colonoscopy tomorrow orders are written clears today n.p.o. at midnight prep this afternoon and tonight  Gatha Mayer, MD, [facility] See Shea Evans on call - gastroenterology for best contact person 01/15/2022 10:20 AM

## 2022-01-15 NOTE — Progress Notes (Signed)
PT Cancellation Note  Patient Details Name: Joseph Malone MRN: [id number] DOB: 15-May-1930   Cancelled Treatment:    Reason Eval/Treat Not Completed: PT screened, no needs identified, will sign off RN and MS report pt is independent.  RN states no PT needs known at this time.  PT to sign off.   Myrtis Hopping [place] 01/15/2022, 1:53 PM Jannette Spanner PT, DPT Physical Therapist [facility] Preferred contact method: Secure Chat Weekend Pager Only: [phone] Office: [phone]

## 2022-01-15 NOTE — Plan of Care (Signed)
  Problem: Education: Goal: Knowledge of General Education information will improve Description: Including pain rating scale, medication(s)/side effects and non-pharmacologic comfort measures Outcome: Progressing   Problem: Clinical Measurements: Goal: Ability to maintain clinical measurements within normal limits will improve Outcome: Progressing Goal: Will remain free from infection Outcome: Progressing Goal: Diagnostic test results will improve Outcome: Progressing Goal: Respiratory complications will improve Outcome: Progressing Goal: Cardiovascular complication will be avoided Outcome: Progressing   Problem: Activity: Goal: Risk for activity intolerance will decrease Outcome: Progressing   Problem: Nutrition: Goal: Adequate nutrition will be maintained Outcome: Progressing   Problem: Coping: Goal: Level of anxiety will decrease Outcome: Progressing   Problem: Elimination: Goal: Will not experience complications related to bowel motility Outcome: Progressing   Problem: Pain Managment: Goal: General experience of comfort will improve Outcome: Progressing   Problem: Safety: Goal: Ability to remain free from injury will improve Outcome: Progressing   Problem: Skin Integrity: Goal: Risk for impaired skin integrity will decrease Outcome: Progressing   Problem: Education: Goal: Ability to identify signs and symptoms of gastrointestinal bleeding will improve Outcome: Progressing   Problem: Bowel/Gastric: Goal: Will show no signs and symptoms of gastrointestinal bleeding Outcome: Progressing   Problem: Fluid Volume: Goal: Will show no signs and symptoms of excessive bleeding Outcome: Progressing   Problem: Clinical Measurements: Goal: Complications related to the disease process, condition or treatment will be avoided or minimized Outcome: Progressing   Problem: Elimination: Goal: Will not experience complications related to urinary retention Outcome:  Adequate for Discharge

## 2022-01-15 NOTE — Progress Notes (Signed)
PROGRESS NOTE  Joseph Malone  DOB: Jun 27, 1930  PCP: Marin Olp, MD WUJ:[id number]  DOA: 01/13/2022  LOS: 1 day  Hospital Day: 3  Brief narrative: Joseph Malone is a 88 y.o. male with PMH significant for HTN, HLD, CAD/CABG, ischemic cardiomyopathy, chronic combined systolic and diastolic CHF, LV thrombus on Coumadin, GERD, BPH, anxiety, diverticulosis 12/4, patient presented to the ED with complaint of rectal bleeding.  2 days ago, he had an episode of melena followed by 3 episodes of hematochezia an associated epigastric discomfort.  Epigastric discomfort continued.  He started having diarrhea and hence came to the ED on 12/4.    In the ER, he was afebrile, hemodynamically stable Labs showed unremarkable CBC, BMP with BUN/creatinine elevated to 42/1.43, glucose elevated 151, INR therapeutic at 2.4 FOBT positive Patient was started on IV Protonix drip Admitted to [facility] GI consulted  Subjective: Patient was seen and examined this morning.   Sitting up in recliner.  Not in distress.  No new symptoms.  INR 2.2 today.    Assessment and plan: Acute GI bleeding Presented with melena, hematochezia and epigastric discomfort while on Coumadin FOBT positive  GI consult appreciated.  Clear liquid diet started.  Switch to IV PPI twice daily. Tentative plan to do colonoscopy +/- EGD tomorrow once INR is down to subtherapeutic level. Hemoglobin stable so far Aspirin and Coumadin on hold Recent Labs    03/11/21 1158 09/09/21 1206 01/13/22 1039 01/13/22 1718 01/14/22 0432  HGB 14.9 14.5 14.3 12.1* 13.4  MCV 91.9 93.2 96.5  --  95.0  VITAMINB12 282 825  --   --   --    History of apical mural thrombus Coumadin on hold.  INR this morning remains therapeutic at 2.4 Recent Labs  Lab 01/13/22 1144 01/14/22 0432 01/15/22 0408  INR 2.4* 2.4* 2.2*   Chronic combined systolic and diastolic congestive heart failure Essential hypertension CHF remains compensated at this time  Most  recent echo from July 2023 with EF 30 to 35%, mild concentric LVH, grade 1 diastolic dysfunction, akinesis of the left ventricular mid apical anteroseptal wall, anterior wall and apical segment. PTA on carvedilol 3.125 mg p.o. twice daily, Aldactone 12.5 mg daily, losartan 25 mg daily, Lasix 40 mg daily PRN. Currently continued on all.    CAD/CABG PAD, HLD Aspirin on hold. Continue beta-blocker and statin.  Type 2 diabetes mellitus A1c controlled at 6.4 in July 2023 PTA on metformin which is currently on hold Currently on sliding scale insulin Accu-Cheks Recent Labs  Lab 01/14/22 0755 01/14/22 1254 01/14/22 2149 01/15/22 0743 01/15/22 1130  GLUCAP 113* 143* 118* 122* 149*   BPH  Continue tamsulosin 0.4 mg p.o. daily.  Goals of care   Code Status: Full Code    Mobility: Encourage ambulation.  Uses a walker at home  Infusions:     Scheduled Meds:  carvedilol  3.125 mg Oral BID WC   folic acid  956 mcg Oral Daily   losartan  25 mg Oral Daily   melatonin  5 mg Oral QHS   metFORMIN  500 mg Oral Q breakfast   metoCLOPramide (REGLAN) injection  10 mg Intravenous Once   pantoprazole (PROTONIX) IV  40 mg Intravenous Q12H   peg 3350 powder  0.5 kit Oral Once   simvastatin  20 mg Oral QHS   spironolactone  12.5 mg Oral Daily   tamsulosin  0.4 mg Oral QHS    PRN meds: acetaminophen **OR** acetaminophen, furosemide, ondansetron **OR**  ondansetron (ZOFRAN) IV, tiZANidine   Skin assessment:     Nutritional status:  Body mass index is 30.5 kg/m.          Diet:  Diet Order             Diet NPO time specified Except for: Sips with Meds  Diet effective midnight           Diet NPO time specified Except for: Sips with Meds  Diet effective midnight           Diet clear liquid Room service appropriate? Yes; Fluid consistency: Thin  Diet effective now                   DVT prophylaxis:  SCDs Start: 01/13/22 1716   Antimicrobials: None Fluid:  None Consultants: GI Family Communication: None at bedside  Status is: Observation  Continue in-hospital care because: Tentative plan of colonoscopy tomorrow Level of care: Telemetry   Dispo: The patient is from: Home              Anticipated d/c is to: Home hopefully              Patient currently is not medically stable to d/c.   Difficult to place patient No       Antimicrobials: Anti-infectives (From admission, onward)    None       Objective: Vitals:   01/15/22 0533 01/15/22 1213  BP: (!) 171/96 117/65  Pulse: 66 74  Resp: 18 16  Temp: 97.6 F (36.4 C) 98.4 F (36.9 C)  SpO2: 95% 99%    Intake/Output Summary (Last 24 hours) at 01/15/2022 1600 Last data filed at 01/15/2022 1300 Gross per 24 hour  Intake 1678.14 ml  Output 307 ml  Net 1371.14 ml   Filed Weights   01/15/22 1048  Weight: 99.2 kg   Weight change:  Body mass index is 30.5 kg/m.   Physical Exam: General exam: Pleasant, elderly Caucasian male.  Not in physical distress. Skin: No rashes, lesions or ulcers. HEENT: Atraumatic, normocephalic, no obvious bleeding Lungs: Clear to auscultation bilaterally CVS: Regular rate and rhythm, no murmur GI/Abd soft, nontender, nondistended, bowel sound present CNS: Alert, awake, oriented x 3 Psychiatry: Mood appropriate Extremities: No pedal edema, no calf tenderness  Data Review: I have personally reviewed the laboratory data and studies available.  F/u labs ordered Unresulted Labs (From admission, onward)     Start     Ordered   01/16/22 4010  Basic metabolic panel  Tomorrow morning,   R        01/15/22 0758   01/16/22 0500  CBC with Differential/Platelet  Tomorrow morning,   R        01/15/22 0758   01/14/22 0500  Protime-INR  Daily,   R      01/13/22 2236            Signed, Terrilee Croak, MD [facility] 01/15/2022

## 2022-01-15 NOTE — Progress Notes (Signed)
  Transition of Care (TOC) Screening Note   Patient Details  Name: Joseph Malone Date of Birth: Mar 13, 1930   Transition of Care [facility]) CM/SW Contact:    Illene Regulus, LCSW Phone Number: 01/15/2022, 2:06 PM    Transition of Care Department [facility]) has reviewed patient and no TOC needs have been identified at this time. We will continue to monitor patient advancement through interdisciplinary progression rounds. If new patient transition needs arise, please place a TOC consult.

## 2022-01-15 NOTE — H&P (View-Only) (Signed)
   Patient Name: Joseph Malone Date of Encounter: 01/15/2022, 10:20 AM    Subjective  Reports a stool yesterday bloody but less so.  Maroon not melena no pain no problems.   Objective  BP (!) 171/96 (BP Location: Right Arm)   Pulse 66   Temp 97.6 F (36.4 C) (Oral)   Resp 18   SpO2 95%  Elderly white man no acute distress vigorous younger than stated age Lungs clear Heart S1-S2 no rubs murmurs or gallops Abdomen is obese soft and nontender bowel sounds are present but not increased     Latest Ref Rng & Units 01/14/2022    4:32 AM 01/13/2022    5:18 PM 01/13/2022   10:39 AM  CBC  WBC 4.0 - 10.5 K/uL 10.0   9.4   Hemoglobin 13.0 - 17.0 g/dL 13.4  12.1  14.3   Hematocrit 39.0 - 52.0 % 40.3  36.8  43.8   Platelets 150 - 400 K/uL 147   163    Lab Results  Component Value Date   INR 2.2 (H) 01/15/2022   INR 2.4 (H) 01/14/2022   INR 2.4 (H) 01/13/2022   Recent Labs  Lab 01/13/22 1039 01/14/22 0432  NA 142 137  K 4.2 3.9  CL 109 108  CO2 24 21*  GLUCOSE 151* 113*  BUN 42* 30*  CREATININE 1.43* 1.23  CALCIUM 9.4 8.9       Assessment and Plan  Hematochezia/blood in stool with bright red and maroon stools hemoglobin remains normal I forgot I am sorry that it is no urgency  Anticoagulation for atrial fibrillation INR 2.2 today   Plan is for colonoscopy tomorrow orders are written clears today n.p.o. at midnight prep this afternoon and tonight  Gatha Mayer, MD, [facility] See Shea Evans on call - gastroenterology for best contact person 01/15/2022 10:20 AM

## 2022-01-15 NOTE — Progress Notes (Signed)
Mobility Specialist - Progress Note   01/15/22 1322  Mobility  Activity Ambulated independently in hallway  Level of Assistance Independent  Assistive Device None  Distance Ambulated (ft) 1000 ft  Range of Motion/Exercises Active  Activity Response Tolerated well  Mobility Referral Yes  $Mobility charge 1 Mobility   Notified by RN that pt wanted to ambulate. Pt was found on recliner chair and eager to get up. Pt was independent throughout session and stated that he gets SOB walking up hill. Had no complaints and at EOS returned to recliner chair with necessities in reach and RN notified.  Ferd Hibbs Mobility Specialist

## 2022-01-15 NOTE — Anesthesia Preprocedure Evaluation (Signed)
Anesthesia Evaluation  Patient identified by MRN, date of birth, ID band Patient awake    Reviewed: Allergy & Precautions, NPO status , Patient's Chart, lab work & pertinent test results  History of Anesthesia Complications Negative for: history of anesthetic complications  Airway Mallampati: III  TM Distance: >3 FB Neck ROM: Full    Dental no notable dental hx.    Pulmonary neg shortness of breath, neg sleep apnea, neg COPD, neg recent URI Pulmonary nodule   Pulmonary exam normal breath sounds clear to auscultation       Cardiovascular hypertension, Pt. on home beta blockers + CAD, + Past MI (2001), + CABG, + Peripheral Vascular Disease and +CHF (EF 30-35%)  + dysrhythmias (PVCs) + Cardiac Defibrillator  Rhythm:Regular Rate:Normal  Apical mural thrombus, HLD  TTE 11/14/2021: IMPRESSIONS     1. Left ventricular ejection fraction, by estimation, is 30 to 35%. The  left ventricle has moderately decreased function. The left ventricle  demonstrates regional wall motion abnormalities (see scoring  diagram/findings for description). There is mild  concentric left ventricular hypertrophy. Left ventricular diastolic  parameters are consistent with Grade I diastolic dysfunction (impaired  relaxation). There is akinesis of the left ventricular, mid-apical  anteroseptal wall, anterior wall and apical  segment.   2. Right ventricular systolic function is normal. The right ventricular  size is normal.   3. The mitral valve is normal in structure. Mild mitral valve  regurgitation. No evidence of mitral stenosis.   4. The aortic valve is tricuspid. There is mild calcification of the  aortic valve. Aortic valve regurgitation is not visualized. Aortic valve  sclerosis/calcification is present, without any evidence of aortic  stenosis.   5. The inferior vena cava is normal in size with greater than 50%  respiratory variability,  suggesting right atrial pressure of 3 mmHg.  \ Cath in 5/19 with stable CAD with patent LIMA to LAD   Neuro/Psych  PSYCHIATRIC DISORDERS Anxiety     negative neurological ROS     GI/Hepatic Neg liver ROS,GERD  ,,diverticulosis   Endo/Other  diabetes    Renal/GU Renal disease (nephrolithiasis)     Musculoskeletal  (+) Arthritis , Osteoarthritis,    Abdominal  (+) + obese  Peds  Hematology negative hematology ROS (+)   Anesthesia Other Findings 88 y.o. male with PMH significant for HTN, HLD, CAD/CABG, ischemic cardiomyopathy, chronic combined systolic and diastolic CHF, LV thrombus on Coumadin, GERD, BPH, anxiety, diverticulosis   12/4, patient presented to the ED with complaint of rectal bleeding.    INR 2.2 on 01/15/22. Last coumadin:  Reproductive/Obstetrics                             Anesthesia Physical Anesthesia Plan  ASA: 4  Anesthesia Plan: MAC   Post-op Pain Management:    Induction: Intravenous  PONV Risk Score and Plan: 1 and Propofol infusion  Airway Management Planned: Natural Airway and Nasal Cannula  Additional Equipment:   Intra-op Plan:   Post-operative Plan:   Informed Consent: I have reviewed the patients History and Physical, chart, labs and discussed the procedure including the risks, benefits and alternatives for the proposed anesthesia with the patient or authorized representative who has indicated his/her understanding and acceptance.     Dental advisory given  Plan Discussed with: CRNA and Anesthesiologist  Anesthesia Plan Comments: (Discussed with patient risks of MAC including, but not limited to, minor pain or discomfort, hearing people  in the room, and possible need for backup general anesthesia. Risks for general anesthesia also discussed including, but not limited to, sore throat, hoarse voice, chipped/damaged teeth, injury to vocal cords, nausea and vomiting, allergic reactions, lung infection, heart  attack, stroke, and death. Discussed increased risks of adverse cardiac events based on co-morbidities. All questions answered. )        Anesthesia Quick Evaluation

## 2022-01-16 ENCOUNTER — Encounter (HOSPITAL_COMMUNITY): Payer: Self-pay | Admitting: Internal Medicine

## 2022-01-16 ENCOUNTER — Observation Stay (HOSPITAL_COMMUNITY): Payer: Medicare Other | Admitting: Anesthesiology

## 2022-01-16 ENCOUNTER — Encounter (HOSPITAL_COMMUNITY)
Admission: EM | Disposition: A | Discharge status: Home / Self Care | Payer: Self-pay | Source: Home / Self Care | Attending: Internal Medicine

## 2022-01-16 ENCOUNTER — Observation Stay (HOSPITAL_BASED_OUTPATIENT_CLINIC_OR_DEPARTMENT_OTHER): Payer: Medicare Other | Admitting: Anesthesiology

## 2022-01-16 DIAGNOSIS — Z7722 Contact with and (suspected) exposure to environmental tobacco smoke (acute) (chronic): Secondary | ICD-10-CM | POA: Diagnosis present

## 2022-01-16 DIAGNOSIS — N1832 Chronic kidney disease, stage 3b: Secondary | ICD-10-CM | POA: Diagnosis present

## 2022-01-16 DIAGNOSIS — D122 Benign neoplasm of ascending colon: Secondary | ICD-10-CM

## 2022-01-16 DIAGNOSIS — F419 Anxiety disorder, unspecified: Secondary | ICD-10-CM | POA: Diagnosis not present

## 2022-01-16 DIAGNOSIS — E1151 Type 2 diabetes mellitus with diabetic peripheral angiopathy without gangrene: Secondary | ICD-10-CM | POA: Diagnosis present

## 2022-01-16 DIAGNOSIS — K648 Other hemorrhoids: Secondary | ICD-10-CM

## 2022-01-16 DIAGNOSIS — K5731 Diverticulosis of large intestine without perforation or abscess with bleeding: Secondary | ICD-10-CM

## 2022-01-16 DIAGNOSIS — I513 Intracardiac thrombosis, not elsewhere classified: Secondary | ICD-10-CM | POA: Diagnosis present

## 2022-01-16 DIAGNOSIS — E1169 Type 2 diabetes mellitus with other specified complication: Secondary | ICD-10-CM | POA: Diagnosis present

## 2022-01-16 DIAGNOSIS — I130 Hypertensive heart and chronic kidney disease with heart failure and stage 1 through stage 4 chronic kidney disease, or unspecified chronic kidney disease: Secondary | ICD-10-CM | POA: Diagnosis present

## 2022-01-16 DIAGNOSIS — I4891 Unspecified atrial fibrillation: Secondary | ICD-10-CM | POA: Diagnosis present

## 2022-01-16 DIAGNOSIS — E1122 Type 2 diabetes mellitus with diabetic chronic kidney disease: Secondary | ICD-10-CM | POA: Diagnosis present

## 2022-01-16 DIAGNOSIS — I358 Other nonrheumatic aortic valve disorders: Secondary | ICD-10-CM | POA: Diagnosis present

## 2022-01-16 DIAGNOSIS — K922 Gastrointestinal hemorrhage, unspecified: Secondary | ICD-10-CM

## 2022-01-16 DIAGNOSIS — E872 Acidosis, unspecified: Secondary | ICD-10-CM | POA: Diagnosis present

## 2022-01-16 DIAGNOSIS — R791 Abnormal coagulation profile: Secondary | ICD-10-CM | POA: Diagnosis present

## 2022-01-16 DIAGNOSIS — I5042 Chronic combined systolic (congestive) and diastolic (congestive) heart failure: Secondary | ICD-10-CM | POA: Diagnosis present

## 2022-01-16 DIAGNOSIS — E785 Hyperlipidemia, unspecified: Secondary | ICD-10-CM | POA: Diagnosis present

## 2022-01-16 DIAGNOSIS — I2510 Atherosclerotic heart disease of native coronary artery without angina pectoris: Secondary | ICD-10-CM | POA: Diagnosis present

## 2022-01-16 DIAGNOSIS — K635 Polyp of colon: Secondary | ICD-10-CM | POA: Diagnosis present

## 2022-01-16 DIAGNOSIS — I252 Old myocardial infarction: Secondary | ICD-10-CM | POA: Diagnosis not present

## 2022-01-16 DIAGNOSIS — K219 Gastro-esophageal reflux disease without esophagitis: Secondary | ICD-10-CM | POA: Diagnosis present

## 2022-01-16 DIAGNOSIS — I700 Atherosclerosis of aorta: Secondary | ICD-10-CM | POA: Diagnosis present

## 2022-01-16 DIAGNOSIS — N179 Acute kidney failure, unspecified: Secondary | ICD-10-CM | POA: Diagnosis present

## 2022-01-16 DIAGNOSIS — N400 Enlarged prostate without lower urinary tract symptoms: Secondary | ICD-10-CM | POA: Diagnosis present

## 2022-01-16 HISTORY — PX: COLONOSCOPY WITH PROPOFOL: SHX5780

## 2022-01-16 HISTORY — PX: POLYPECTOMY: SHX5525

## 2022-01-16 LAB — CBC WITH DIFFERENTIAL/PLATELET
Abs Immature Granulocytes: 0.03 10*3/uL (ref 0.00–0.07)
Basophils Absolute: 0.1 10*3/uL (ref 0.0–0.1)
Basophils Relative: 1 %
Eosinophils Absolute: 0.2 10*3/uL (ref 0.0–0.5)
Eosinophils Relative: 2 %
HCT: 40.2 % (ref 39.0–52.0)
Hemoglobin: 13.1 g/dL (ref 13.0–17.0)
Immature Granulocytes: 0 %
Lymphocytes Relative: 37 %
Lymphs Abs: 2.7 10*3/uL (ref 0.7–4.0)
MCH: 31.1 pg (ref 26.0–34.0)
MCHC: 32.6 g/dL (ref 30.0–36.0)
MCV: 95.5 fL (ref 80.0–100.0)
Monocytes Absolute: 0.6 10*3/uL (ref 0.1–1.0)
Monocytes Relative: 8 %
Neutro Abs: 3.7 10*3/uL (ref 1.7–7.7)
Neutrophils Relative %: 52 %
Platelets: 158 10*3/uL (ref 150–400)
RBC: 4.21 MIL/uL — ABNORMAL LOW (ref 4.22–5.81)
RDW: 12.7 % (ref 11.5–15.5)
WBC: 7.3 10*3/uL (ref 4.0–10.5)
nRBC: 0 % (ref 0.0–0.2)

## 2022-01-16 LAB — GLUCOSE, CAPILLARY
Glucose-Capillary: 108 mg/dL — ABNORMAL HIGH (ref 70–99)
Glucose-Capillary: 134 mg/dL — ABNORMAL HIGH (ref 70–99)
Glucose-Capillary: 146 mg/dL — ABNORMAL HIGH (ref 70–99)
Glucose-Capillary: 173 mg/dL — ABNORMAL HIGH (ref 70–99)
Glucose-Capillary: 126 mg/dL — ABNORMAL HIGH (ref 70–99)

## 2022-01-16 LAB — BASIC METABOLIC PANEL
Anion gap: 11 (ref 5–15)
BUN: 23 mg/dL (ref 8–23)
CO2: 21 mmol/L — ABNORMAL LOW (ref 22–32)
Calcium: 8.9 mg/dL (ref 8.9–10.3)
Chloride: 111 mmol/L (ref 98–111)
Creatinine, Ser: 1.59 mg/dL — ABNORMAL HIGH (ref 0.61–1.24)
GFR, Estimated: 41 mL/min — ABNORMAL LOW (ref >60)
Glucose, Bld: 122 mg/dL — ABNORMAL HIGH (ref 70–99)
Potassium: 4.4 mmol/L (ref 3.5–5.1)
Sodium: 143 mmol/L (ref 135–145)

## 2022-01-16 LAB — PROTIME-INR
INR: 2.1 — ABNORMAL HIGH (ref 0.8–1.2)
Prothrombin Time: 22.9 seconds — ABNORMAL HIGH (ref 11.4–15.2)

## 2022-01-16 SURGERY — COLONOSCOPY WITH PROPOFOL
Anesthesia: Monitor Anesthesia Care

## 2022-01-16 MED ORDER — PHENYLEPHRINE HCL-NACL 20-0.9 MG/250ML-% IV SOLN
INTRAVENOUS | Status: DC | PRN
  Administered 2022-01-16: 25 ug/min via INTRAVENOUS

## 2022-01-16 MED ORDER — PROPOFOL 500 MG/50ML IV EMUL
INTRAVENOUS | Status: DC | PRN
  Administered 2022-01-16: 120 ug/kg/min via INTRAVENOUS
  Administered 2022-01-16: 10 mg via INTRAVENOUS

## 2022-01-16 MED ORDER — LACTATED RINGERS IV SOLN: INTRAVENOUS | Status: DC | PRN

## 2022-01-16 MED ORDER — SODIUM CHLORIDE 0.9 % IV SOLN: INTRAVENOUS | Status: DC

## 2022-01-16 MED ORDER — EPHEDRINE SULFATE-NACL 50-0.9 MG/10ML-% IV SOSY
PREFILLED_SYRINGE | INTRAVENOUS | Status: DC | PRN
  Administered 2022-01-16 (×2): 5 mg via INTRAVENOUS

## 2022-01-16 SURGICAL SUPPLY — 22 items

## 2022-01-16 NOTE — Interval H&P Note (Signed)
History and Physical Interval Note:  01/16/2022 12:13 PM  Joseph Malone  has presented today for surgery, with the diagnosis of hematochezia.  The various methods of treatment have been discussed with the patient and family. After consideration of risks, benefits and other options for treatment, the patient has consented to  Procedure(s): COLONOSCOPY WITH PROPOFOL (N/A) as a surgical intervention.  The patient's history has been reviewed, patient examined, no change in status, stable for surgery.  I have reviewed the patient's chart and labs.  Questions were answered to the patient's satisfaction.     Dominick Zertuche

## 2022-01-16 NOTE — Anesthesia Postprocedure Evaluation (Signed)
Anesthesia Post Note  Patient: Joseph Malone  Procedure(s) Performed: COLONOSCOPY WITH PROPOFOL POLYPECTOMY     Patient location during evaluation: PACU Anesthesia Type: MAC Level of consciousness: awake Pain management: pain level controlled Vital Signs Assessment: post-procedure vital signs reviewed and stable Respiratory status: spontaneous breathing, nonlabored ventilation and respiratory function stable Cardiovascular status: stable and blood pressure returned to baseline Postop Assessment: no apparent nausea or vomiting Anesthetic complications: no   No notable events documented.  Last Vitals:  Vitals:   01/16/22 1425 01/16/22 1430  BP: 96/63 100/63  Pulse: 79 83  Resp: 15 (!) 22  Temp: (!) 36.1 C   SpO2: 98% 98%    Last Pain:  Vitals:   01/16/22 1425  TempSrc: Temporal  PainSc: 0-No pain                 [name]

## 2022-01-16 NOTE — Progress Notes (Signed)
Mobility Specialist - Progress Note   01/16/22 1029  Mobility  Activity Ambulated independently in hallway  Level of Assistance Independent  Assistive Device None  Distance Ambulated (ft) 700 ft  Range of Motion/Exercises Active  Activity Response Tolerated well  Mobility Referral Yes  $Mobility charge 1 Mobility   Pt was found on recliner chair and agreeable to ambulate. Had no complaints during session and at EOS returned to recliner chair with necessities in reach.  Ferd Hibbs Mobility Specialist

## 2022-01-16 NOTE — Op Note (Signed)
[facility] Patient Name: Joseph Malone Procedure Date: 01/16/2022 MRN: [id number] Attending MD: Mauri Pole , MD, [id number] Date of Birth: [birth year] CSN: [id number] Age: 88 Admit Type: Inpatient Procedure:                Colonoscopy Indications:              Evaluation of unexplained GI bleeding presenting                            with Hematochezia Providers:                Mauri Pole, MD, Jeanella Cara, RN,                            William Dalton, Technician Referring MD:              Medicines:                Monitored Anesthesia Care Complications:            No immediate complications. Estimated Blood Loss:     Estimated blood loss: none. Procedure:                Pre-Anesthesia Assessment:                           - Prior to the procedure, a History and Physical                            was performed, and patient medications and                            allergies were reviewed. The patient's tolerance of                            previous anesthesia was also reviewed. The risks                            and benefits of the procedure and the sedation                            options and risks were discussed with the patient.                            All questions were answered, and informed consent                            was obtained. Prior Anticoagulants: The patient                            last took Coumadin (warfarin) 3 days prior to the                            procedure. ASA Grade Assessment: III - A patient  with severe systemic disease. After reviewing the                            risks and benefits, the patient was deemed in                            satisfactory condition to undergo the procedure.                           After obtaining informed consent, the colonoscope                            was passed under direct vision. Throughout the                             procedure, the patient's blood pressure, pulse, and                            oxygen saturations were monitored continuously. The                            PCF-HQ190L ([id number] Olympus colonoscope was                            introduced through the anus and advanced to the the                            cecum, identified by appendiceal orifice and                            ileocecal valve. The colonoscopy was performed                            without difficulty. The patient tolerated the                            procedure well. The quality of the bowel                            preparation was poor. The ileocecal valve,                            appendiceal orifice, and rectum were photographed. Scope In: 2:04:09 PM Scope Out: 2:12:55 PM Scope Withdrawal Time: 0 hours 3 minutes 28 seconds  Total Procedure Duration: 0 hours 8 minutes 46 seconds  Findings:      The perianal and digital rectal examinations were normal.      A 5 mm polyp was found in the ascending colon. The polyp was sessile.       The polyp was removed with a cold snare. Resection and retrieval were       complete.      Multiple large-mouthed diverticula were found in the sigmoid colon and       descending colon.      Non-bleeding internal hemorrhoids were found during retroflexion. The  hemorrhoids were medium-sized.      The exam was otherwise without abnormality with brown stool, no evidence       of recent GI bleed or active bleed. Impression:               - One 5 mm polyp in the ascending colon, removed                            with a cold snare. Resected and retrieved.                           - Diverticulosis in the sigmoid colon and in the                            descending colon.                           - Non-bleeding internal hemorrhoids.                           - The examination was otherwise normal with brown                            stool, no evidence of recent GI bleed or  active                            bleed. Moderate Sedation:      N/A Recommendation:           - Patient has a contact number available for                            emergencies. The signs and symptoms of potential                            delayed complications were discussed with the                            patient. Return to normal activities tomorrow.                            Written discharge instructions were provided to the                            patient.                           - Resume previous diet.                           - Continue present medications.                           - Resume Coumadin (warfarin) at prior dose                            tomorrow. Refer to Coumadin Clinic for further  adjustment of therapy.                           - Await pathology results.                           - No repeat colonoscopy due to age.                           - Return to GI clinic at the next available                            appointment for hemorrhoidal band ligation if has                            recurrent episodes of hematochezia.                           - Use Benefiber two teaspoons PO TID.                           - Use hydrocortisone suppository 25 mg 1 per rectum                            once a day for 7 days.                           - Ok to discharge home from GI standpoint, GI will                            sign off Procedure Code(s):        --- Professional ---                           [phone], Colonoscopy, flexible; with removal of                            tumor(s), polyp(s), or other lesion(s) by snare                            technique Diagnosis Code(s):        --- Professional ---                           D12.2, Benign neoplasm of ascending colon                           K64.8, Other hemorrhoids                           K92.1, Melena (includes Hematochezia)                           K57.30,  Diverticulosis of large intestine without  perforation or abscess without bleeding CPT copyright 2022 [employer]. All rights reserved. The codes documented in this report are preliminary and upon coder review may  be revised to meet current compliance requirements. Mauri Pole, MD 01/16/2022 2:49:22 PM This report has been signed electronically. Number of Addenda: 0

## 2022-01-16 NOTE — Transfer of Care (Signed)
Immediate Anesthesia Transfer of Care Note  Patient: Joseph Malone  Procedure(s) Performed: COLONOSCOPY WITH PROPOFOL POLYPECTOMY  Patient Location: PACU and Endoscopy Unit  Anesthesia Type:MAC  Level of Consciousness: awake, alert , oriented, and patient cooperative  Airway & Oxygen Therapy: Patient Spontanous Breathing and Patient connected to face mask oxygen  Post-op Assessment: Report given to RN, Post -op Vital signs reviewed and stable, and Patient moving all extremities  Post vital signs: Reviewed and stable  Last Vitals:  Vitals Value Taken Time  BP 96/63 01/16/22 1425  Temp    Pulse 85 01/16/22 1426  Resp 20 01/16/22 1426  SpO2 100 % 01/16/22 1426  Vitals shown include unvalidated device data.  Last Pain:  Vitals:   01/16/22 1217  TempSrc: Temporal  PainSc: 0-No pain         Complications: No notable events documented.

## 2022-01-16 NOTE — Progress Notes (Signed)
PROGRESS NOTE  REMBERT Malone  DOB: 1930-06-28  PCP: Marin Olp, MD UYQ:[id number]  DOA: 01/13/2022  LOS: 1 day  Hospital Day: 4  Brief narrative: Joseph Malone is a 88 y.o. male with PMH significant for HTN, HLD, CAD/CABG, ischemic cardiomyopathy, chronic combined systolic and diastolic CHF, LV thrombus on Coumadin, GERD, BPH, anxiety, diverticulosis 12/4, patient presented to the ED with complaint of rectal bleeding.  2 days ago, he had an episode of melena followed by 3 episodes of hematochezia an associated epigastric discomfort.  Epigastric discomfort continued.  He started having diarrhea and hence came to the ED on 12/4.    In the ER, he was afebrile, hemodynamically stable Labs showed unremarkable CBC, BMP with BUN/creatinine elevated to 42/1.43, glucose elevated 151, INR therapeutic at 2.4 FOBT positive Patient was started on IV Protonix drip Admitted to [facility] GI consulted  Subjective: Patient was seen and examined this morning.   Sitting up in chair.  Not in distress. He was waiting for colonoscopy today. Labs from this morning showed creatinine up  Assessment and plan: Acute GI bleeding Presented with melena, hematochezia and epigastric discomfort while on Coumadin FOBT positive  GI consult appreciated.  Clear liquid diet started.  Switch to IV PPI twice daily. Hemoglobin stable. 2/7, underwent colonoscopy today.  1 ascending colon polyp was removed.  He was noted to have diverticulosis in the sigmoid colon and descending colon.  He has nonbleeding internal hemorrhoids.  Otherwise no evidence of recent GI bleeding or active bleed.  Will start the diet.  Resume Coumadin tomorrow Follow-up with GI for pathology report  Colonic diverticulosis Nonbleeding internal hemorrhoids Patient to follow-up GI clinic at the next available appointment for hemorrhoidal band ligation if has recurrent episodes of hematochezia. At home, patient to use Benefiber two teaspoons PO  TID. Use hydrocortisone suppository 25 mg 1 per rectum once a day for 7 days.  History of apical mural thrombus Coumadin on hold.  INR this morning remains therapeutic at 2.4 Recent Labs  Lab 01/13/22 1144 01/14/22 0432 01/15/22 0408 01/16/22 0405  INR 2.4* 2.4* 2.2* 2.1*   AKI on CKD 3B Creatinine was 1.3 yesterday.  Elevated to 1.59 today.  Probably because of diarrhea loss during colonic preparation. Start low-flow IV hydration today.  Repeat creatinine tomorrow Recent Labs    03/11/21 1158 09/09/21 1206 11/14/21 1449 01/13/22 1039 01/14/22 0432 01/16/22 0405  BUN 26* 27* 28* 42* 30* 23  CREATININE 1.26 1.40 1.28* 1.43* 1.23 1.59*   Chronic combined systolic and diastolic congestive heart failure Essential hypertension CHF remains compensated at this time  Most recent echo from July 2023 with EF 30 to 35%, mild concentric LVH, grade 1 diastolic dysfunction, akinesis of the left ventricular mid apical anteroseptal wall, anterior wall and apical segment. PTA on carvedilol 3.125 mg p.o. twice daily, Aldactone 12.5 mg daily, losartan 25 mg daily, Lasix 40 mg daily PRN. Continue Coreg.  Hold losartan and Aldactone because of AKI. Patient states he takes Lasix only once in 1 to 2 weeks. Repeat creatinine tomorrow    CAD/CABG PAD, HLD Aspirin on hold. Continue beta-blocker and statin.  Type 2 diabetes mellitus A1c controlled at 6.4 in July 2023 PTA on metformin which is currently on hold Currently on sliding scale insulin Accu-Cheks Recent Labs  Lab 01/15/22 1616 01/15/22 2004 01/16/22 0731 01/16/22 1132 01/16/22 1227  GLUCAP 104* 102* 108* 134* 146*   BPH  Continue tamsulosin 0.4 mg p.o. daily.  Goals of care  Code Status: Full Code    Mobility: Encourage ambulation.  Uses a walker at home  Infusions:   sodium chloride       Scheduled Meds:  carvedilol  3.125 mg Oral BID WC   folic acid  876 mcg Oral Daily   melatonin  5 mg Oral QHS   metFORMIN  500  mg Oral Q breakfast   pantoprazole (PROTONIX) IV  40 mg Intravenous Q12H   simvastatin  20 mg Oral QHS   tamsulosin  0.4 mg Oral QHS    PRN meds: acetaminophen **OR** acetaminophen, furosemide, ondansetron **OR** ondansetron (ZOFRAN) IV, tiZANidine   Skin assessment:     Nutritional status:  Body mass index is 30.5 kg/m.          Diet:  Diet Order     None       DVT prophylaxis:  SCDs Start: 01/13/22 1716   Antimicrobials: None Fluid: None Consultants: GI Family Communication: None at bedside  Status is: Observation  Continue in-hospital care because: Underwent colonoscopy today.  Unable to discharge because of AKI Level of care: Telemetry   Dispo: The patient is from: Home              Anticipated d/c is to: Home hopefully tomorrow              Patient currently is not medically stable to d/c.   Difficult to place patient No       Antimicrobials: Anti-infectives (From admission, onward)    None       Objective: Vitals:   01/16/22 1440 01/16/22 1504  BP: 124/70 (!) 139/92  Pulse: 79 78  Resp: 17 16  Temp:  (!) 97.5 F (36.4 C)  SpO2: 95% 98%    Intake/Output Summary (Last 24 hours) at 01/16/2022 1509 Last data filed at 01/16/2022 1426 Gross per 24 hour  Intake 1238.36 ml  Output --  Net 1238.36 ml   Filed Weights   01/15/22 1048  Weight: 99.2 kg   Weight change:  Body mass index is 30.5 kg/m.   Physical Exam: General exam: Pleasant, elderly Caucasian male.  Not in physical distress. Skin: No rashes, lesions or ulcers. HEENT: Atraumatic, normocephalic, no obvious bleeding Lungs: Clear to auscultation bilaterally CVS: Regular rate and rhythm, no murmur GI/Abd soft, nontender, nondistended, bowel sound present CNS: Alert, awake, oriented x 3 Psychiatry: Mood appropriate Extremities: No pedal edema, no calf tenderness  Data Review: I have personally reviewed the laboratory data and studies available.  F/u labs  ordered Unresulted Labs (From admission, onward)     Start     Ordered   01/17/22 8115  Basic metabolic panel  Tomorrow morning,   R       Question:  Specimen collection method  Answer:  Lab=Lab collect   01/16/22 1508   01/14/22 0500  Protime-INR  Daily,   R      01/13/22 2236            Signed, Terrilee Croak, MD [facility] 01/16/2022

## 2022-01-17 DIAGNOSIS — K922 Gastrointestinal hemorrhage, unspecified: Secondary | ICD-10-CM

## 2022-01-17 LAB — BASIC METABOLIC PANEL
Anion gap: 8 (ref 5–15)
BUN: 19 mg/dL (ref 8–23)
CO2: 21 mmol/L — ABNORMAL LOW (ref 22–32)
Calcium: 8.6 mg/dL — ABNORMAL LOW (ref 8.9–10.3)
Chloride: 108 mmol/L (ref 98–111)
Creatinine, Ser: 1.33 mg/dL — ABNORMAL HIGH (ref 0.61–1.24)
GFR, Estimated: 51 mL/min — ABNORMAL LOW (ref >60)
Glucose, Bld: 107 mg/dL — ABNORMAL HIGH (ref 70–99)
Potassium: 3.6 mmol/L (ref 3.5–5.1)
Sodium: 137 mmol/L (ref 135–145)

## 2022-01-17 LAB — PROTIME-INR
INR: 1.9 — ABNORMAL HIGH (ref 0.8–1.2)
Prothrombin Time: 21.9 seconds — ABNORMAL HIGH (ref 11.4–15.2)

## 2022-01-17 LAB — GLUCOSE, CAPILLARY
Glucose-Capillary: 102 mg/dL — ABNORMAL HIGH (ref 70–99)
Glucose-Capillary: 155 mg/dL — ABNORMAL HIGH (ref 70–99)
Glucose-Capillary: 96 mg/dL (ref 70–99)

## 2022-01-17 MED ORDER — HYDROCORTISONE ACETATE 25 MG RE SUPP: 25.0000 mg | Freq: Every day | RECTAL | 0 refills | Status: AC

## 2022-01-17 MED ORDER — PANTOPRAZOLE SODIUM 40 MG PO TBEC: 40.0000 mg | DELAYED_RELEASE_TABLET | Freq: Every day | ORAL | Status: DC

## 2022-01-17 MED ORDER — WARFARIN - PHARMACIST DOSING INPATIENT: Freq: Every day | Status: DC

## 2022-01-17 MED ORDER — WARFARIN SODIUM 5 MG PO TABS
5.0000 mg | ORAL_TABLET | Freq: Once | ORAL | Status: AC
  Administered 2022-01-17: 5 mg via ORAL
  Filled 2022-01-17: qty 1

## 2022-01-17 MED ORDER — PANTOPRAZOLE SODIUM 40 MG PO TBEC: 40.0000 mg | DELAYED_RELEASE_TABLET | Freq: Every day | ORAL | 0 refills | Status: DC

## 2022-01-17 NOTE — Discharge Summary (Signed)
Discharge Summary  Joseph Malone XTK:[id number] DOB: 04-20-1930  PCP: Joseph Olp, MD  Admit date: 01/13/2022 Discharge date: 01/17/2022  Time spent: 35 minutes.  Recommendations for Outpatient Follow-up:  Follow-up with GI in 1 to 2 weeks. Follow-up with your cardiologist in 1 to 2 weeks. Follow-up with your primary care provider in 1 to 2 weeks.  Discharge Diagnoses:  Active Hospital Problems   Diagnosis Date Noted   GI bleeding 01/13/2022   Polyp of ascending colon 01/16/2022   Acute GI bleeding 01/14/2022   Apical mural thrombus 01/13/2022   Chronic combined systolic and diastolic congestive heart failure ([place]) 01/13/2022   Atherosclerosis of aorta ([place]) 05/18/2020   Hematochezia    DM (diabetes mellitus) type II, controlled, with peripheral vascular disorder ([place]) 09/14/2008   BPH without obstruction/lower urinary tract symptoms 10/22/2007   Hyperlipidemia associated with type 2 diabetes mellitus ([place]) 04/22/2007   CAD in native artery 09/25/2006   Essential hypertension 09/25/2006    Resolved Hospital Problems  No resolved problems to display.    Discharge Condition: The patient is adamant about going home today.  Diet recommendation: Resume previous diet.  Vitals:   01/17/22 0500 01/17/22 1253  BP: 121/62 129/78  Pulse: 67 90  Resp:  18  Temp: 97.6 F (36.4 C) 97.6 F (36.4 C)  SpO2: 98% 98%    History of present illness:   Joseph Malone is a 88 y.o. male with PMH significant for HTN, HLD, CAD/CABG, ischemic cardiomyopathy, chronic combined systolic and diastolic CHF, LV thrombus on Coumadin, GERD, BPH, anxiety, diverticulosis 12/4, patient presented to the ED with complaint of rectal bleeding.  2 days ago, he had an episode of melena followed by 3 episodes of hematochezia an associated epigastric discomfort.  Epigastric discomfort continued.  He started having diarrhea and hence came to the ED on 12/4.     In the ER, he was afebrile, hemodynamically  stable Labs showed unremarkable CBC, BMP with BUN/creatinine elevated to 42/1.43, glucose elevated 151, INR therapeutic at 2.4 FOBT positive Patient was started on IV Protonix drip Admitted to [facility] GI consulted status post colonoscopy.   01/17/2022: Reports small amount of blood on his toilet paper, this morning.  Minimal abdominal pain, mainly at the epigastric region.   The patient is adamant about going home today.  Now denying that he had some bleeding this morning.  Now denies abdominal pain.  He wants to go home today.  Hospital Course:  Principal Problem:   GI bleeding Active Problems:   Hyperlipidemia associated with type 2 diabetes mellitus (HCC)   Essential hypertension   CAD in native artery   BPH without obstruction/lower urinary tract symptoms   DM (diabetes mellitus) type II, controlled, with peripheral vascular disorder (HCC)   Hematochezia   Atherosclerosis of aorta (HCC)   Apical mural thrombus   Chronic combined systolic and diastolic congestive heart failure (HCC)   Acute GI bleeding   Polyp of ascending colon  Acute GI bleeding Presented with melena, hematochezia and epigastric discomfort while on Coumadin FOBT positive  Post colonoscopy.  Resume regular diet as recommended by GI.  Resume Coumadin as recommended by GI. 1 ascending colon polyp was removed.  He was noted to have diverticulosis in the sigmoid colon and descending colon.  He has nonbleeding internal hemorrhoids.  Otherwise no evidence of recent GI bleeding or active bleed.  Follow-up with GI for pathology report Coumadin restarted on 01/17/2022, follow INR.   Colonic diverticulosis Nonbleeding internal  hemorrhoids Patient to follow-up GI clinic at the next available appointment for hemorrhoidal band ligation if has recurrent episodes of hematochezia. At home, patient to use Benefiber two teaspoons PO TID. Use hydrocortisone suppository 25 mg 1 per rectum once a day for 7 days.   History of  apical mural thrombus INR 1.9.  Coumadin restarted on 01/17/2022.  AKI on CKD 3B Creatinine improving 1.33 with GFR of 51.   Mild non-anion gap metabolic acidosis Serum bicarb 21, anion gap 8 Received gentle IV fluid hydration, completed on 01/17/2022.   Chronic combined systolic and diastolic congestive heart failure Essential hypertension CHF remains compensated at this time  Most recent echo from July 2023 with EF 30 to 35%, mild concentric LVH, grade 1 diastolic dysfunction, akinesis of the left ventricular mid apical anteroseptal wall, anterior wall and apical segment. PTA on carvedilol 3.125 mg p.o. twice daily, Aldactone 12.5 mg daily, losartan 25 mg daily, Lasix 40 mg daily PRN. Continue Coreg.  Hold losartan and Aldactone because of AKI. Patient states he takes Lasix only once in 1 to 2 weeks. Closely monitor volume status. Strict I's and O's and daily weight.    CAD/CABG PAD, HLD Resume home regimen and follow-up with cardiology.   Type 2 diabetes mellitus A1c controlled at 6.4 in July 2023 Resume home regimen and follow-up with your PCP.   BPH  Continue tamsulosin 0.4 mg p.o. daily. Monitor urine output   Goals of care   Code Status: Full Code        Procedures: Colonoscopy.  Consultations: GI.  Discharge Exam: BP 129/78 (BP Location: Right Arm)   Pulse 90   Temp 97.6 F (36.4 C) (Oral)   Resp 18   Ht _0  (1.803 m)   Wt 99.2 kg   SpO2 98%   BMI 30.50 kg/m  General: 88 y.o. year-old male well developed well nourished in no acute distress.  Alert and oriented x3. Cardiovascular: Regular rate and rhythm with no rubs or gallops.  No thyromegaly or JVD noted.   Respiratory: Clear to auscultation with no wheezes or rales. Good inspiratory effort. Musculoskeletal: No lower extremity edema. 2/4 pulses in all 4 extremities. Skin: No ulcerative lesions noted or rashes, Psychiatry: Mood is appropriate for condition and setting  Discharge  Instructions You were cared for by a hospitalist during your hospital stay. If you have any questions about your discharge medications or the care you received while you were in the hospital after you are discharged, you can call the unit and asked to speak with the hospitalist on call if the hospitalist that took care of you is not available. Once you are discharged, your primary care physician will handle any further medical issues. Please note that NO REFILLS for any discharge medications will be authorized once you are discharged, as it is imperative that you return to your primary care physician (or establish a relationship with a primary care physician if you do not have one) for your aftercare needs so that they can reassess your need for medications and monitor your lab values.   Allergies as of 01/17/2022       Reactions   Penicillins Rash, Other (See Comments)   Because of a history of documented adverse serious drug reaction;Medi Alert bracelet  is recommended Has patient had a PCN reaction causing immediate rash, facial/tongue/throat swelling, SOB or lightheadedness with hypotension: No Has patient had a PCN reaction causing severe rash involving mucus membranes or skin necrosis: No Has patient  had a PCN reaction that required hospitalization: No Has patient had a PCN reaction occurring within the last 10 years: No If all of the above answers are "NO", t        Medication List     STOP taking these medications    losartan 25 MG tablet Commonly known as: COZAAR       TAKE these medications    acetaminophen 500 MG tablet Commonly known as: TYLENOL Take 500 mg by mouth at bedtime.   aspirin 81 MG tablet Take 1 tablet (81 mg total) by mouth every evening.   carvedilol 3.125 MG tablet Commonly known as: COREG Take 3.125 mg by mouth 2 (two) times daily with a meal.   diphenhydramine-acetaminophen 25-500 MG Tabs tablet Commonly known as: TYLENOL PM Take 1 tablet by  mouth at bedtime as needed (For sleep).   folic acid 322 MCG tablet Commonly known as: FOLVITE Take 400 mcg by mouth daily.   furosemide 40 MG tablet Commonly known as: LASIX Take 1 tablet (40 mg total) by mouth daily as needed. What changed: reasons to take this   Glucosamine HCl 1500 MG Tabs Take 1,500 mg by mouth every evening.   glucose blood test strip Use to test blood sugars daily. Dx: E11.9   hydrocortisone 25 MG suppository Commonly known as: ANUSOL-HC Place 1 suppository (25 mg total) rectally daily for 7 days.   Melatonin 5 MG Chew Chew 1 tablet by mouth daily as needed.   metFORMIN 500 MG 24 hr tablet Commonly known as: GLUCOPHAGE-XR TAKE 1 TABLET(500 MG) BY MOUTH DAILY WITH BREAKFAST What changed: See the new instructions.   multivitamin with minerals Tabs tablet Take 1 tablet by mouth daily.   ONE TOUCH ULTRA 2 w/Device Kit Use to test blood sugars daily. Dx: G25.4   OneTouch Delica Plus [id number] Misc Use to test blood sugars daily. Dx: G31.5   OneTouch Delica Lancets 17O Misc USE TO TEST BLOOD SUGAR DAILY   pantoprazole 40 MG tablet Commonly known as: PROTONIX Take 1 tablet (40 mg total) by mouth daily. Start taking on: January 18, 2022   simvastatin 40 MG tablet Commonly known as: ZOCOR Take 0.5 tablets (20 mg total) by mouth at bedtime.   spironolactone 25 MG tablet Commonly known as: ALDACTONE Take 0.5 tablets (12.5 mg total) by mouth daily.   tamsulosin 0.4 MG Caps capsule Commonly known as: FLOMAX Take 0.4 mg by mouth at bedtime.   tiZANidine 2 MG tablet Commonly known as: ZANAFLEX TAKE 1 TABLET(2 MG) BY MOUTH DAILY AS NEEDED FOR MUSCLE SPASMS. DO NOT DRIVE FOR 8 HOURS AFTER USE What changed: See the new instructions.   triamcinolone cream 0.1 % Commonly known as: KENALOG Apply 1 Application topically daily as needed (Foot rash).   Vitamin D3 50 MCG (2000 UT) Tabs Take 2,000 Units by mouth daily.   warfarin 5 MG  tablet Commonly known as: COUMADIN Take as directed. If you are unsure how to take this medication, talk to your nurse or doctor. Original instructions: TAKE ONE TABLET BY MOUTH ON MONDAY, WEDNESDAY, AND FRIDAY AND TAKE ONE-HALF TABLET ALL OTHER DAYS OR AS DIRECTED What changed:  how much to take how to take this when to take this additional instructions        Allergies  Allergen Reactions   Penicillins Rash and Other (See Comments)    Because of a history of documented adverse serious drug reaction;Medi Alert bracelet  is recommended Has patient had a PCN  reaction causing immediate rash, facial/tongue/throat swelling, SOB or lightheadedness with hypotension: No Has patient had a PCN reaction causing severe rash involving mucus membranes or skin necrosis: No Has patient had a PCN reaction that required hospitalization: No Has patient had a PCN reaction occurring within the last 10 years: No If all of the above answers are "NO", t      The results of significant diagnostics from this hospitalization (including imaging, microbiology, ancillary and laboratory) are listed below for reference.    Significant Diagnostic Studies: No results found.  Microbiology: No results found for this or any previous visit (from the past 240 hour(s)).   Labs: Basic Metabolic Panel: Recent Labs  Lab 01/13/22 1039 01/14/22 0432 01/16/22 0405 01/17/22 0400  NA 142 137 143 137  K 4.2 3.9 4.4 3.6  CL 109 108 111 108  CO2 24 21* 21* 21*  GLUCOSE 151* 113* 122* 107*  BUN 42* 30* 23 19  CREATININE 1.43* 1.23 1.59* 1.33*  CALCIUM 9.4 8.9 8.9 8.6*   Liver Function Tests: Recent Labs  Lab 01/13/22 1039 01/14/22 0432  AST 20 17  ALT 19 19  ALKPHOS 51 46  BILITOT 0.7 0.8  PROT 7.2 6.8  ALBUMIN 4.0 3.7   No results for input(s): "LIPASE", "AMYLASE" in the last 168 hours. No results for input(s): "AMMONIA" in the last 168 hours. CBC: Recent Labs  Lab 01/13/22 1039 01/13/22 1718  01/14/22 0432 01/16/22 0405  WBC 9.4  --  10.0 7.3  NEUTROABS  --   --   --  3.7  HGB 14.3 12.1* 13.4 13.1  HCT 43.8 36.8* 40.3 40.2  MCV 96.5  --  95.0 95.5  PLT 163  --  147* 158   Cardiac Enzymes: No results for input(s): "CKTOTAL", "CKMB", "CKMBINDEX", "TROPONINI" in the last 168 hours. BNP: BNP (last 3 results) No results for input(s): "BNP" in the last 8760 hours.  ProBNP (last 3 results) No results for input(s): "PROBNP" in the last 8760 hours.  CBG: Recent Labs  Lab 01/16/22 1740 01/16/22 2008 01/17/22 0816 01/17/22 1131 01/17/22 1621  GLUCAP 173* 126* 102* 155* 96       Signed:  Kayleen Memos, MD [facility] 01/17/2022, 5:56 PM

## 2022-01-17 NOTE — Progress Notes (Signed)
PROGRESS NOTE  Joseph Malone  DOB: 1930-11-05  PCP: Marin Olp, MD [id number]:[id number]  DOA: 01/13/2022  LOS: 2 days  Hospital Day: 5  Brief narrative: Joseph Malone is a 88 y.o. male with PMH significant for HTN, HLD, CAD/CABG, ischemic cardiomyopathy, chronic combined systolic and diastolic CHF, LV thrombus on Coumadin, GERD, BPH, anxiety, diverticulosis 12/4, patient presented to the ED with complaint of rectal bleeding.  2 days ago, he had an episode of melena followed by 3 episodes of hematochezia an associated epigastric discomfort.  Epigastric discomfort continued.  He started having diarrhea and hence came to the ED on 12/4.    In the ER, he was afebrile, hemodynamically stable Labs showed unremarkable CBC, BMP with BUN/creatinine elevated to 42/1.43, glucose elevated 151, INR therapeutic at 2.4 FOBT positive Patient was started on IV Protonix drip Admitted to [facility] GI consulted status post colonoscopy.  01/17/2022: Reports small amount of blood on his toilet paper, this morning.  Minimal abdominal pain, mainly at the epigastric region.    Assessment and plan: Acute GI bleeding Presented with melena, hematochezia and epigastric discomfort while on Coumadin FOBT positive  Post colonoscopy.  Resume regular diet as recommended by GI.  Resume Coumadin as recommended by GI. 1 ascending colon polyp was removed.  He was noted to have diverticulosis in the sigmoid colon and descending colon.  He has nonbleeding internal hemorrhoids.  Otherwise no evidence of recent GI bleeding or active bleed.  Follow-up with GI for pathology report Coumadin restarted on 01/17/2022, follow INR.  Colonic diverticulosis Nonbleeding internal hemorrhoids Patient to follow-up GI clinic at the next available appointment for hemorrhoidal band ligation if has recurrent episodes of hematochezia. At home, patient to use Benefiber two teaspoons PO TID. Use hydrocortisone suppository 25 mg 1 per rectum once  a day for 7 days.  History of apical mural thrombus Coumadin on hold.  INR this morning remains therapeutic at 2.4 Recent Labs  Lab 01/13/22 1144 01/14/22 0432 01/15/22 0408 01/16/22 0405 01/17/22 0400  INR 2.4* 2.4* 2.2* 2.1* 1.9*   AKI on CKD 3B Creatinine improving 1.33 with GFR of 51.  Mild non-anion gap metabolic acidosis Serum bicarb 21, anion gap 8 Received gentle IV fluid hydration, completed on 01/17/2022. Repeat BMP in the morning  Chronic combined systolic and diastolic congestive heart failure Essential hypertension CHF remains compensated at this time  Most recent echo from July 2023 with EF 30 to 35%, mild concentric LVH, grade 1 diastolic dysfunction, akinesis of the left ventricular mid apical anteroseptal wall, anterior wall and apical segment. PTA on carvedilol 3.125 mg p.o. twice daily, Aldactone 12.5 mg daily, losartan 25 mg daily, Lasix 40 mg daily PRN. Continue Coreg.  Hold losartan and Aldactone because of AKI. Patient states he takes Lasix only once in 1 to 2 weeks. Closely monitor volume status. Strict I's and O's and daily weight.    CAD/CABG PAD, HLD Aspirin on hold. Continue beta-blocker and statin.  Type 2 diabetes mellitus A1c controlled at 6.4 in July 2023 PTA on metformin which is currently on hold Currently on sliding scale insulin Accu-Cheks  BPH  Continue tamsulosin 0.4 mg p.o. daily. Monitor urine output  Goals of care   Code Status: Full Code    Mobility: Encourage ambulation.  Uses a walker at home  Infusions:   sodium chloride 50 mL/hr at 01/16/22 1746     Scheduled Meds:  carvedilol  3.125 mg Oral BID WC   folic acid  163  mcg Oral Daily   melatonin  5 mg Oral QHS   metFORMIN  500 mg Oral Q breakfast   pantoprazole (PROTONIX) IV  40 mg Intravenous Q12H   simvastatin  20 mg Oral QHS   tamsulosin  0.4 mg Oral QHS   Warfarin - Pharmacist Dosing Inpatient   Does not apply q1600    PRN meds: acetaminophen **OR**  acetaminophen, furosemide, ondansetron **OR** ondansetron (ZOFRAN) IV, tiZANidine   Skin assessment:     Nutritional status:  Body mass index is 30.5 kg/m.          Diet:  Diet Order             DIET SOFT Room service appropriate? Yes; Fluid consistency: Thin  Diet effective now                   DVT prophylaxis:  SCDs Start: 01/13/22 1716   Antimicrobials: None Fluid: None Consultants: GI Family Communication: None at bedside  Status is: Observation  Continue in-hospital care because: Underwent colonoscopy today.  Unable to discharge because of AKI Level of care: Telemetry   Dispo: The patient is from: Home              Anticipated d/c is to: Home hopefully on 01/18/2022.              Patient currently is not medically stable to d/c.   Difficult to place patient No       Antimicrobials: Anti-infectives (From admission, onward)    None       Objective: Vitals:   01/17/22 0500 01/17/22 1253  BP: 121/62 129/78  Pulse: 67 90  Resp:  18  Temp: 97.6 F (36.4 C) 97.6 F (36.4 C)  SpO2: 98% 98%    Intake/Output Summary (Last 24 hours) at 01/17/2022 1644 Last data filed at 01/17/2022 0445 Gross per 24 hour  Intake 543.57 ml  Output --  Net 543.57 ml   Filed Weights   01/15/22 1048  Weight: 99.2 kg   Weight change:  Body mass index is 30.5 kg/m.   Physical Exam: General exam: Pleasant elderly well-developed well-nourished in no acute distress.  He is alert and interactive.   Skin: No rashes, lesions or ulcers. HEENT: Atraumatic, normocephalic, no obvious bleeding Lungs: Clear to auscultation with no wheezes or rales.   CVS: Regular rate and rhythm no rubs or gallops. GI/Abd epigastric tenderness with palpation. CNS: Alert, awake, oriented x 3 Psychiatry: Mood is appropriate for condition and setting. Extremities: No lower extremity edema bilaterally.  Data Review: I have personally reviewed the laboratory data and studies  available.  F/u labs ordered Unresulted Labs (From admission, onward)     Start     Ordered   01/14/22 0500  Protime-INR  Daily,   R      01/13/22 2236            Signed, Kayleen Memos, MD [facility] 01/17/2022

## 2022-01-17 NOTE — Progress Notes (Signed)
ANTICOAGULATION CONSULT NOTE - Consult  Pharmacy Consult for warfarin Indication:  apical mural thrombus  Allergies  Allergen Reactions   Penicillins Rash and Other (See Comments)    Because of a history of documented adverse serious drug reaction;Medi Alert bracelet  is recommended Has patient had a PCN reaction causing immediate rash, facial/tongue/throat swelling, SOB or lightheadedness with hypotension: No Has patient had a PCN reaction causing severe rash involving mucus membranes or skin necrosis: No Has patient had a PCN reaction that required hospitalization: No Has patient had a PCN reaction occurring within the last 10 years: No If all of the above answers are "NO", t    Patient Measurements: Height: '5\' 11"'$  (180.3 cm) Weight: 99.2 kg (218 lb 11.2 oz) IBW/kg (Calculated) : 75.3 Heparin Dosing Weight:   Vital Signs: Temp: 97.6 F (36.4 C) (12/08 1253) Temp Source: Oral (12/08 1253) BP: 129/78 (12/08 1253) Pulse Rate: 90 (12/08 1253)  Labs: Recent Labs    01/15/22 0408 01/16/22 0405 01/17/22 0400  HGB  --  13.1  --   HCT  --  40.2  --   PLT  --  158  --   LABPROT 23.9* 22.9* 21.9*  INR 2.2* 2.1* 1.9*  CREATININE  --  1.59* 1.33*    Estimated Creatinine Clearance: 45.2 mL/min (A) (by C-G formula based on SCr of 1.33 mg/dL (H)).   Medical History: Past Medical History:  Diagnosis Date   Anxiety    BPH (benign prostatic hypertrophy)    CAD (coronary artery disease)    s/p CABG   CHF (congestive heart failure) (HCC)    Diverticulosis    Diverticulosis of colon without hemorrhage 10/22/2007   Qualifier: Diagnosis of  By: Linna Darner MD, [name]     DOE (dyspnea on exertion)    Excess weight    Fasting hyperglycemia    Generalized weakness    GERD (gastroesophageal reflux disease)    Heart attack ([place]) 2001   Heart failure    CHF due to ischemic CM   History of kidney stones    Hyperlipidemia    Hypertension    Ischemic cardiomyopathy    Microscopic  hematuria    [facility]   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 Warfarin was initially held as pt presented to ED with melena, hematochezia and epigastric discomfort. Pt underwent colonoscopy and one  ascending colon polyp was removed. He was noted to have diverticulosis in the sigmoid colon and descending colon. He has nonbleeding internal hemorrhoids. Otherwise no evidence of recent GI bleeding or active bleed. Warfarin ok to resume on 12/8.   Pt home regimen is warfarin 5 mg daily except 2.5 on Monday and Thursday.   Today, 01/17/22  INR 1.9, subtherapeutic as med has been held upon admission Hgb 13.1 on 12/7  No new DDI with warfarin    Goal of Therapy:  INR 2-3 Monitor platelets by anticoagulation protocol: Yes   Plan:  Warfarin 5 mg PO x 1  Daily INR Monitor for signs and symptoms of bleeding   Royetta Asal, PharmD, BCPS 01/17/2022 1:42 PM

## 2022-01-17 NOTE — Progress Notes (Signed)
Mobility Specialist - Progress Note   01/17/22 1620  Mobility  Activity Ambulated independently in hallway  Level of Assistance Independent  Assistive Device None  Distance Ambulated (ft) 700 ft  Range of Motion/Exercises Active  Activity Response Tolerated well  Mobility Referral Yes  $Mobility charge 1 Mobility   Pt was found on recliner chair and agreeable to ambulate. Had no complaints and stated feeling good. At EOS returned to sit EOB to eat his dinner with necessities in reach.  Ferd Hibbs Mobility Specialist

## 2022-01-20 ENCOUNTER — Telehealth: Payer: Self-pay

## 2022-01-20 ENCOUNTER — Encounter (HOSPITAL_COMMUNITY): Payer: Self-pay | Admitting: Gastroenterology

## 2022-01-20 LAB — SURGICAL PATHOLOGY

## 2022-01-20 NOTE — Patient Outreach (Signed)
  Care Coordination TOC Note Transition Care Management Follow-up Telephone Call Date of discharge and from where: 01/17/22-[facility]  Dx: "GI Bleeding" How have you been since you were released from the hospital? Patient states he is doing well. Denies any bleeding or GI sxs. Any questions or concerns? Yes-Patient wanting to know how often he should take suppository. Reviewed d/c medications and instructions with patient. He voices LBM was Sat-no issues reported. Patient also wanting to know about when he can start eating other foods except soft foods-per MD notes -okay to "resume previous diet." Discussed with patient and encouraged to advance diet slowly.  Items Reviewed: Did the pt receive and understand the discharge instructions provided? Yes  Medications obtained and verified? Yes  Other? Yes  Any new allergies since your discharge? No  Dietary orders reviewed? Yes Do you have support at home? Yes   Home Care and Equipment/Supplies: Were home health services ordered? not applicable If so, what is the name of the agency? N/A  Has the agency set up a time to come to the patient's home? not applicable Were any new equipment or medical supplies ordered?  No What is the name of the medical supply agency? N/A Were you able to get the supplies/equipment? not applicable Do you have any questions related to the use of the equipment or supplies? No  Functional Questionnaire: (I = Independent and D = Dependent) ADLs: I  Bathing/Dressing- I  Meal Prep- I  Eating- I  Maintaining continence- I  Transferring/Ambulation- I  Managing Meds- I  Follow up appointments reviewed:  PCP Hospital f/u appt confirmed?  Patient discharged Friday evening-will call offices today to make an appt  . [facility] f/u appt confirmed? .[phone] Are transportation arrangements needed? No  If their condition worsens, is the pt aware to call PCP or go to the Emergency Dept.? Yes Was  the patient provided with contact information for the PCP's office or ED? Yes Was to pt encouraged to call back with questions or concerns? Yes  SDOH assessments and interventions completed:   Yes SDOH Interventions Today    Flowsheet Row Most Recent Value  SDOH Interventions   Food Insecurity Interventions Intervention Not Indicated  Transportation Interventions Intervention Not Indicated       Care Coordination Interventions:  Education provided    Encounter Outcome:  Pt. Visit Completed    Enzo Montgomery, RN,BSN,CCM [place] Management Telephonic Care Management Coordinator Direct Phone: [phone] Toll Free: [phone] Fax: [phone]

## 2022-01-20 NOTE — Patient Outreach (Signed)
  Care Coordination [facility] Note Transition Care Management Unsuccessful Follow-up Telephone Call  Date of discharge and from where:  01/17/22-[facility]  Attempts:  1st Attempt  Reason for unsuccessful TCM follow-up call:  Left voice message     Hetty Blend [place] Management Telephonic Care Management Coordinator Direct Phone: [phone] Toll Free: [phone] Fax: [phone]'

## 2022-01-29 NOTE — Progress Notes (Signed)
Advanced Heart Failure Clinic Note   Date:  02/11/2022   ID:  Joseph Malone, DOB 07/30/1930, MRN [id number]  Location: Home  Provider location: [place] [facility] Type of Visit: Established patient  PCP:  Marin Olp, MD  HF Cardiologist: Bensimhon  Chief Complaint: Heart Failure follow-up   HPI:  Joseph Malone is a pleasant 88 y.o. male with a history of coronary artery disease status post large anterior MI with coronary  bypass grafting in 2001 with a LIMA to the LAD.  He has history of  congestive heart failure secondary to resultant ischemic cardiomyopathy, ejection fraction however is in the 35%.  He is status post  single-chamber ICD.    The rest of his medical history is notable for obesity, hypertension,  hyperlipidemia, and glucose intolerance.  Cath in 5/19 with stable CAD with patent LIMA to LAD.  Echo (10/23): EF stable 30-35%  Admitted 12/23 with GIB. INR 2.4 on admit, FOBT + and warfarin held. GI consulted, underwent colonoscopy showing 1 polyp removed. Had diverticulosis in the sigmoid colon and descending colon. There was a nonbleeding internal hemorrhoid. No clear evidence of recent GI bleed. Warfarin resumed. Hospitalization c/b by AKI, losartan and spiro held, only spiro restarted at discharge.   Today he returns for post hospital HF follow up. Overall feeling fine. Had some CP after eating a few weeks ago. Felt pressure with inspiration. Has chest soreness when using upper arm machines. He feels chest discomfort is muscle strain. Working out at the [employer] 2-3x/week for 40 minutes at a time. He has SOB walking up inclines, this is his baseline. Occasional dizziness, no falls.  Denies palpitations, abnormal bleeding, edema, or PND/Orthopnea. Appetite ok. No fever or chills. Weight at home 217 pounds. Taking all medications. Took lasix last week, usually averages every 2 weeks.   Cardiac Studies:  - Echo (10/23): EF 30-35%   - R/LHC 06/12/2017    Ao = 143/77 (105) LV 145/8 RA = 3 RV = 26/4 PA = 27/5 (12) PCW = 9 Fick cardiac output/index = 4.4/2.0 PVR = 0.7 WU FA sat = 97% PA sat = 62%, 63%   1. Left-dominant coronary system with chronically-occluded LAD 2. Non-obstructive CAD elsewhere 3. Patent LIMA to LAD 4. LV gram not performed due to LV thrombus 5. Normal right heart pressure with mildly reduced cardiac output   - Echo 05/2017 EF 30-35% with    Past Medical History:  Diagnosis Date   Anxiety    BPH (benign prostatic hypertrophy)    CAD (coronary artery disease)    s/p CABG   CHF (congestive heart failure) ([place])    Diverticulosis    Diverticulosis of colon without hemorrhage 10/22/2007   Qualifier: Diagnosis of  By: Linna Darner MD, [name]     DOE (dyspnea on exertion)    Excess weight    Fasting hyperglycemia    Generalized weakness    GERD (gastroesophageal reflux disease)    Heart attack ([place]) 2001   Heart failure    CHF due to ischemic CM   History of kidney stones    Hyperlipidemia    Hypertension    Ischemic cardiomyopathy    Microscopic hematuria    [facility]   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   Past Surgical History:  Procedure Laterality Date   APPENDECTOMY     BIOPSY  10/21/2018   Procedure: BIOPSY;  Surgeon: Yetta Flock, MD;  Location: [facility];  Service: Gastroenterology;;   CARDIAC CATHETERIZATION  2000   CARDIAC DEFIBRILLATOR PLACEMENT  2005   [employer]; s/p ICD gen change and RV lead revision April 2015   CATARACT EXTRACTION  2008   bilateral with lens implant   COLONOSCOPY  2004   Tics; [facility]   COLONOSCOPY WITH PROPOFOL N/A 01/16/2022   Procedure: COLONOSCOPY WITH PROPOFOL;  Surgeon: Mauri Pole, MD;  Location: [facility];  Service: Gastroenterology;  Laterality: N/A;   CORONARY ARTERY BYPASS GRAFT  2000   1 vessel   CYSTOSCOPY  06/2012   Dr [name]   ESOPHAGOGASTRODUODENOSCOPY (EGD) WITH PROPOFOL N/A 10/21/2018   Procedure:  ESOPHAGOGASTRODUODENOSCOPY (EGD) WITH PROPOFOL;  Surgeon: Yetta Flock, MD;  Location: [facility];  Service: Gastroenterology;  Laterality: N/A;   HOT HEMOSTASIS N/A 10/21/2018   Procedure: HOT HEMOSTASIS (ARGON PLASMA COAGULATION/BICAP);  Surgeon: Yetta Flock, MD;  Location: [facility];  Service: Gastroenterology;  Laterality: N/A;   IMPLANTABLE CARDIOVERTER DEFIBRILLATOR (ICD) GENERATOR CHANGE N/A 06/01/2013   Procedure: ICD GENERATOR CHANGE;  Surgeon: [name], MD;  Location: [facility] CATH LAB;  Service: Cardiovascular;  Laterality: N/A;   LEAD REVISION N/A 06/01/2013   Procedure: LEAD REVISION;  Surgeon: [name], MD;  Location: [facility] CATH LAB;  Service: Cardiovascular;  Laterality: N/A;   PILONIDAL CYST EXCISION     POLYPECTOMY  01/16/2022   Procedure: POLYPECTOMY;  Surgeon: Mauri Pole, MD;  Location: [facility];  Service: Gastroenterology;;   RIGHT/LEFT HEART CATH AND CORONARY/GRAFT ANGIOGRAPHY N/A 06/12/2017   Procedure: RIGHT/LEFT HEART CATH AND CORONARY/GRAFT ANGIOGRAPHY;  Surgeon: [name], MD;  Location: [facility] LAB;  Service: Cardiovascular;  Laterality: N/A;   TRANSTHORACIC ECHOCARDIOGRAM  08/29/2005   Current Outpatient Medications  Medication Sig Dispense Refill   acetaminophen (TYLENOL) 500 MG tablet Take 500 mg by mouth at bedtime.      aspirin 81 MG tablet Take 1 tablet (81 mg total) by mouth every evening.     Blood Glucose Monitoring Suppl (ONE TOUCH ULTRA 2) w/Device KIT Use to test blood sugars daily. Dx: E11.9 1 kit 3   carvedilol (COREG) 3.125 MG tablet Take 3.125 mg by mouth 2 (two) times daily with a meal.     Cholecalciferol (VITAMIN D3) 2000 UNITS TABS Take 2,000 Units by mouth daily.     diphenhydramine-acetaminophen (TYLENOL PM) 25-500 MG TABS tablet Take 1 tablet by mouth at bedtime as needed (For sleep).     folic acid (FOLVITE) 812 MCG tablet Take 400 mcg by mouth daily.     furosemide (LASIX) 40 MG tablet Take 1  tablet (40 mg total) by mouth daily as needed. (Patient taking differently: Take 40 mg by mouth daily as needed for fluid.) 30 tablet 3   Glucosamine HCl 1500 MG TABS Take 1,500 mg by mouth every evening.      glucose blood test strip Use to test blood sugars daily. Dx: E11.9 100 each 12   Lancets (ONETOUCH DELICA PLUS [id number]) MISC Use to test blood sugars daily. Dx: E11.9 100 each 3   losartan (COZAAR) 50 MG tablet TAKE 1 TABLET BY MOUTH DAILY. GENERIC EQUIVALENT FOR COZAAR 90 tablet 3   Melatonin 5 MG CHEW Chew 1 tablet by mouth daily as needed.     metFORMIN (GLUCOPHAGE-XR) 500 MG 24 hr tablet TAKE 1 TABLET(500 MG) BY MOUTH DAILY WITH BREAKFAST (Patient taking differently: Take 500 mg by mouth daily with breakfast.) 30 tablet 5   Multiple Vitamin (MULTIVITAMIN WITH  MINERALS) TABS Take 1 tablet by mouth daily.      OneTouch Delica Lancets 98Y MISC USE TO TEST BLOOD SUGAR DAILY 100 each 3   pantoprazole (PROTONIX) 40 MG tablet Take 1 tablet (40 mg total) by mouth daily. 30 tablet 0   simvastatin (ZOCOR) 40 MG tablet Take 0.5 tablets (20 mg total) by mouth at bedtime. 45 tablet 3   spironolactone (ALDACTONE) 25 MG tablet Take 0.5 tablets (12.5 mg total) by mouth daily. 90 tablet 3   tamsulosin (FLOMAX) 0.4 MG CAPS capsule Take 0.4 mg by mouth at bedtime.     tiZANidine (ZANAFLEX) 2 MG tablet TAKE 1 TABLET(2 MG) BY MOUTH DAILY AS NEEDED FOR MUSCLE SPASMS. DO NOT DRIVE FOR 8 HOURS AFTER USE (Patient taking differently: Take 2 mg by mouth every 6 (six) hours as needed for muscle spasms.) 90 tablet 0   triamcinolone cream (KENALOG) 0.1 % Apply 1 Application topically daily as needed (Foot rash).     warfarin (COUMADIN) 5 MG tablet TAKE ONE TABLET BY MOUTH ON MONDAY, WEDNESDAY, AND FRIDAY AND TAKE ONE-HALF TABLET ALL OTHER DAYS OR AS DIRECTED (Patient taking differently: Take 2.5-5 mg by mouth See admin instructions. Take 5 mg tablet by mouth every day except on Mon and Thursdays take 2.5 mg by mouth  per patient) 100 tablet 1   No current facility-administered medications for this encounter.   Allergies:   Penicillins   Social History:  The patient  reports that he has never smoked. He has been exposed to tobacco smoke. He has never used smokeless tobacco. He reports that he does not drink alcohol and does not use drugs.   Family History:  The patient's family history includes Diabetes in his brother; Heart attack (age of onset: 39) in his brother, father, and paternal uncle; Hypertension in his father; Lung cancer in his sister; Nephrolithiasis in his brother; Sudden death (age of onset: 39) in his paternal grandfather.   ROS:  Please see the history of present illness.   All other systems are personally reviewed and negative.   Recent Labs: 01/30/2022: ALT 13; BUN 33; Creatinine, Ser 1.37; Hemoglobin 13.8; Platelets 154.0; Potassium 4.6; Sodium 141  Personally reviewed   Wt Readings from Last 3 Encounters:  02/11/22 100.7 kg (222 lb)  01/30/22 100 kg (220 lb 6.4 oz)  01/15/22 99.2 kg (218 lb 11.2 oz)   BP (!) 144/76   Pulse 79   Wt 100.7 kg (222 lb)   SpO2 96%   BMI 30.96 kg/m   Physical Exam General:  NAD. No resp difficulty, walked into clinic HEENT: Normal Neck: Supple. No JVD. Carotids 2+ bilat; no bruits. No lymphadenopathy or thryomegaly appreciated. Cor: PMI nondisplaced. Regular rate & rhythm. No rubs, gallops or murmurs. Lungs: Clear Abdomen: Soft, nontender, nondistended. No hepatosplenomegaly. No bruits or masses. Good bowel sounds. Extremities: No cyanosis, clubbing, rash, edema Neuro: Alert & oriented x 3, cranial nerves grossly intact. Moves all 4 extremities w/o difficulty. Affect pleasant.  ECG (personally reviewed): NSR with PVC, 75 bpm  ICD interrogation (personally reviewed): OptiVol down, thoracic impedence just below reference line, 1 hr/day activity, no VT or AF  ASSESSMENT AND PLAN:  1) CAD:  - s/p CABG 2001 with LIMA to LAD  - Cath 5/19 with  intact revascularization and normal filling pressures.  - had CP a couple weeks ago after eating and with upper arm work out; ? MSK vs GERD. But no clear exertional angina since. - ECG ok today. -  Continue statin, ASA and beta blocker. - Will give Rx for PRN SL nitro. Has BP room for long-acting nitrate if pain continues. Would hold off on repeat LHC in absence of clear ACS and known chronically occluded LAD, with mild disease in RCA and Cx. - Discussed ED precautions.   2) Chronic systolic HF: ICM,  - Echo 05/2017 EF 30-35% with apical thrombus. [employer] ICD. No shocks - Cath with 5/19 with intact revascularization and normal filling pressures  - Echo 10/23 EF unchanged at 30-35%.  - NYHA II. Volume status stable.  - Continue lasix 40 mg daily as needed.   - Continue coreg 3.125 mg bid. - Continue spiro 12.5 mg daily. - Continue losartan 50 mg daily. - No SGLT2i with hx of UTIs  - Recent labs at PCP follow up reviewed and look OK; K 4.6, SCr 1.37, hgb 13.8  4) HTN - Stable.   5) HLD - Managed by PCP.  - Continue statin. No change.   6) Apical Thrombus - On coumadin.  - No bleeding issues. Last INR 1.9.  - Has repeat INR scheduled tomorrow.  7) h/o GIB - colonoscopy 12/23 with 1 polyp removed, diverticulosis and non-bleeding internal hemorrhoids; no evidence of active GI bleed.  - Resolved. - Follow up with GI.  Keep follow up with Dr. Haroldine Laws 11/2022, as scheduled.   [place], [place]  02/11/2022 9:43 AM  Advanced [facility] Failure [place] [address] [phone] (office) [phone] (fax)

## 2022-01-30 ENCOUNTER — Ambulatory Visit (INDEPENDENT_AMBULATORY_CARE_PROVIDER_SITE_OTHER): Payer: Medicare Other | Admitting: Family Medicine

## 2022-01-30 ENCOUNTER — Encounter: Payer: Self-pay | Admitting: Family Medicine

## 2022-01-30 VITALS — BP 110/60 | HR 64 | Temp 97.8°F | Ht 71.0 in | Wt 220.4 lb

## 2022-01-30 DIAGNOSIS — E1151 Type 2 diabetes mellitus with diabetic peripheral angiopathy without gangrene: Secondary | ICD-10-CM | POA: Diagnosis not present

## 2022-01-30 DIAGNOSIS — K921 Melena: Secondary | ICD-10-CM

## 2022-01-30 DIAGNOSIS — I513 Intracardiac thrombosis, not elsewhere classified: Secondary | ICD-10-CM | POA: Diagnosis not present

## 2022-01-30 DIAGNOSIS — I5022 Chronic systolic (congestive) heart failure: Secondary | ICD-10-CM

## 2022-01-30 DIAGNOSIS — I2510 Atherosclerotic heart disease of native coronary artery without angina pectoris: Secondary | ICD-10-CM

## 2022-01-30 MED ORDER — LOSARTAN POTASSIUM 50 MG PO TABS: ORAL_TABLET | ORAL | 3 refills | Status: DC

## 2022-01-30 NOTE — Patient Instructions (Addendum)
Please stop by lab before you go If you have mychart- we will send your results within 3 business days of [place] receiving them.  If you do not have mychart- we will call you about results within 5 business days of [place] receiving them.  *please also note that you will see labs on mychart as soon as they post. I will later go in and write notes on them- will say "notes from Dr. Yong Channel"   Let us know immediately if any blood in stool. Stay on coumadin as long as no blood and make sure to keep that check, GI check with Dr. Teena Irani, as well as cardiology follow up with heart failure clinic   Recommended follow up: Return in about 4 months (around 06/01/2022) for followup or sooner if needed.Schedule b4 you leave.

## 2022-01-30 NOTE — Progress Notes (Signed)
Phone [phone]   Subjective:  Joseph Malone is a 88 y.o. year old very pleasant male patient who presents for transitional care management and hospital follow up for GI bleed. Patient was hospitalized from 01/13/2022 to 01/17/2022. A TCM phone call was completed on 01/20/2022. Medical complexity moderate  Patient presented on 01/13/2022 to the hospital with complaint of rectal bleeding.  2 days prior had episode of melena followed by 3 episodes of hematochezia and associated epigastric discomfort.  His epigastric discomfort continued and then he began to have some diarrhea and chose to be evaluated  Initially in the emergency department labs showed elevated BUN to creatinine ratio, INR therapeutic at 2.4, fecal occult blood test positive.  Patient was started on IV Protonix and admitted to the hospital while GI was consulted.  Patient had colonoscopy on 01/16/2022 with 1 polyp removed.  Had diverticulosis in the sigmoid colon and descending colon.  There was a nonbleeding internal hemorrhoid.  No clear evidence of recent GI bleed.  Coumadin was restarted on 01/17/2022 with plan for outpatient follow-up with our Coumadin clinic.  There was consideration of hemorrhoidal band ligation if recurrent episodes of hematochezia-she was advised to use hydrocortisone suppositories  # History of apical mural thrombus-underlying reason for Coumadin and was restarted at discharge  # Acute kidney injury-improved during hospitalization-discharge GFR at 51-did receive IV hydration  # CHF/hypertension-patient appears euvolemic during hospitalization-had echocardiogram within 6 months so was not repeated.  Patient was on carvedilol 3.125 mg twice daily, Aldactone 12.5 mg daily, losartan 25 mg, Lasix 40 mg daily as needed before hospitalization-losartan and Aldactone were held because of AKI.  Noted patient will take Lasix every 1 to 2 weeks so this was held as well-carvedilol was continued.  Losartan was held on  dischargebut spironolactone was restarted  # CAD-continued on statin-simvastatin 40 mg-and aspirin 81 mg as well as Coumadin as above  # Diabetes-well-controlled in July at 6.4 on metformin500 mg extended release CBGs- on 13th when back home sugar was 180 and took again and 200 but next day trending down to 155 and 133 next day, 158 and 159 on 18th and 20th. Metformin was held in hospital and may have contributed.  Lab Results  Component Value Date   HGBA1C 6.2 01/30/2022   HGBA1C 6.4 09/09/2021   HGBA1C 6.7 (H) 03/11/2021   Denies subsequent bleeding. No more constipation since getting out of hospital- thinks may have had issue with hemorrhoid- suppository has been used and he is feeling better with no  bulge (did not feel before though) . Prior abdominal pain improved- also had some epigastric pain with eating but also doing better. Twinge of chest pain with lifting but doing better . Stable shortness of breath with going uphill.   See problem oriented charting as well  Past Medical History-  Patient Active Problem List   Diagnosis Date Noted   GI bleeding 01/13/2022    Priority: High   Apical mural thrombus 01/13/2022    Priority: High   Chronic systolic heart failure ([place]) 12/09/2010    Priority: High   Cardiomyopathy, ischemic 10/02/2009    Priority: High   Automatic implantable cardioverter-defibrillator in situ 01/16/2009    Priority: High   DM (diabetes mellitus) type II, controlled, with peripheral vascular disorder ([place]) 09/14/2008    Priority: High   CAD in native artery 09/25/2006    Priority: High   Atherosclerosis of aorta ([place]) 05/18/2020    Priority: Medium    Nephrolithiasis 03/24/2016  Priority: Medium    Hyperlipidemia associated with type 2 diabetes mellitus ([place]) 04/22/2007    Priority: Medium    Essential hypertension 09/25/2006    Priority: Medium    Long term (current) use of anticoagulants 07/08/2017    Priority: Low   Erectile dysfunction  01/19/2014    Priority: Low   Basal cell cancer 09/29/2012    Priority: Low   Solitary pulmonary nodule 07/12/2012    Priority: Low   BPH without obstruction/lower urinary tract symptoms 10/22/2007    Priority: Low   Osteoarthritis 09/25/2006    Priority: Low   Hematuria 09/25/2006    Priority: Low   Polyp of ascending colon 01/16/2022   Acute GI bleeding 01/14/2022   Chronic combined systolic and diastolic congestive heart failure ([place]) 01/13/2022   PVC's (premature ventricular contractions) 01/24/2020   Hematochezia     Medications- reviewed and updated  A medical reconciliation was performed comparing current medicines to hospital discharge medications. Current Outpatient Medications  Medication Sig Dispense Refill   acetaminophen (TYLENOL) 500 MG tablet Take 500 mg by mouth at bedtime.      aspirin 81 MG tablet Take 1 tablet (81 mg total) by mouth every evening.     Blood Glucose Monitoring Suppl (ONE TOUCH ULTRA 2) w/Device KIT Use to test blood sugars daily. Dx: E11.9 1 kit 3   carvedilol (COREG) 3.125 MG tablet Take 3.125 mg by mouth 2 (two) times daily with a meal.     Cholecalciferol (VITAMIN D3) 2000 UNITS TABS Take 2,000 Units by mouth daily.     diphenhydramine-acetaminophen (TYLENOL PM) 25-500 MG TABS tablet Take 1 tablet by mouth at bedtime as needed (For sleep).     folic acid (FOLVITE) 628 MCG tablet Take 400 mcg by mouth daily.     furosemide (LASIX) 40 MG tablet Take 1 tablet (40 mg total) by mouth daily as needed. (Patient taking differently: Take 40 mg by mouth daily as needed for fluid.) 30 tablet 3   Glucosamine HCl 1500 MG TABS Take 1,500 mg by mouth every evening.      glucose blood test strip Use to test blood sugars daily. Dx: E11.9 100 each 12   Lancets (ONETOUCH DELICA PLUS [id number]) MISC Use to test blood sugars daily. Dx: E11.9 100 each 3   Melatonin 5 MG CHEW Chew 1 tablet by mouth daily as needed.     metFORMIN (GLUCOPHAGE-XR) 500 MG 24 hr tablet  TAKE 1 TABLET(500 MG) BY MOUTH DAILY WITH BREAKFAST (Patient taking differently: Take 500 mg by mouth daily with breakfast.) 30 tablet 5   Multiple Vitamin (MULTIVITAMIN WITH MINERALS) TABS Take 1 tablet by mouth daily.      OneTouch Delica Lancets 73X MISC USE TO TEST BLOOD SUGAR DAILY 100 each 3   pantoprazole (PROTONIX) 40 MG tablet Take 1 tablet (40 mg total) by mouth daily. 30 tablet 0   simvastatin (ZOCOR) 40 MG tablet Take 0.5 tablets (20 mg total) by mouth at bedtime. 45 tablet 3   spironolactone (ALDACTONE) 25 MG tablet Take 0.5 tablets (12.5 mg total) by mouth daily. 90 tablet 3   tamsulosin (FLOMAX) 0.4 MG CAPS capsule Take 0.4 mg by mouth at bedtime.     tiZANidine (ZANAFLEX) 2 MG tablet TAKE 1 TABLET(2 MG) BY MOUTH DAILY AS NEEDED FOR MUSCLE SPASMS. DO NOT DRIVE FOR 8 HOURS AFTER USE (Patient taking differently: Take 2 mg by mouth every 6 (six) hours as needed for muscle spasms.) 90 tablet 0  triamcinolone cream (KENALOG) 0.1 % Apply 1 Application topically daily as needed (Foot rash).     warfarin (COUMADIN) 5 MG tablet TAKE ONE TABLET BY MOUTH ON MONDAY, WEDNESDAY, AND FRIDAY AND TAKE ONE-HALF TABLET ALL OTHER DAYS OR AS DIRECTED (Patient taking differently: Take 2.5-5 mg by mouth See admin instructions. Take 5 mg tablet by mouth every day except on Mon and Thursdays take 2.5 mg by mouth per patient) 100 tablet 1   No current facility-administered medications for this visit.   Objective  Objective:  BP 110/60   Pulse 64   Temp 97.8 F (36.6 C)   Ht _0  (1.803 m)   Wt 220 lb 6.4 oz (100 kg)   SpO2 94%   BMI 30.74 kg/m  Gen: NAD, resting comfortably CV: RRR no murmurs rubs or gallops Lungs: CTAB no crackles, wheeze, rhonchi Abdomen: soft/nontender/nondistended/normal bowel sounds.  Ext: Trace edema Skin: warm, dry  Diabetic Foot Exam - Simple   Simple Foot Form Diabetic Foot exam was performed with the following findings: Yes 01/30/2022  4:12 PM  Visual  Inspection No deformities, no ulcerations, no other skin breakdown bilaterally: Yes Sensation Testing Intact to touch and monofilament testing bilaterally: Yes Pulse Check Posterior Tibialis and Dorsalis pulse intact bilaterally: Yes Comments      Assessment and Plan:   # TCM/hospital follow-up  GI bleeding It appears GI bleed may have been related to an internal hemorrhoid which could have been triggered by recent constipation (colonoscopy while hospitalized with 1 polyp removed, diverticulosis noted but no active bleeding and nonbleeding internal hemorrhoid)-patient has been doing well at home with suppositories and denies recurrent bleeding.  Certainly his baseline Coumadin contributes but we opted to continue at this time.  Has close follow-up with GI-scheduled 02/17/2022 and I reinforced the importance of keeping this visit.  Also reinforced importance of keeping Coumadin (underlying apical mural thrombus) clinic on 02/12/2022 to ensure INR therapeutic but not supratherapeutic which could contribute to risk -He agrees to let us know immediately if recurrent GI bleed and there is some consideration of banding but seems to be doing better with suppository  Apical mural thrombus Primary reason for Coumadin-continue current medication and close follow-up with our Coumadin clinic  Chronic systolic heart failure [facility]) Patient follows closely with cardiology-he did have AKI in the hospital and losartan and Aldactone were held.  His Lasix was only as needed before hospitalization and this was monitored without Lasix in the hospital.  He has baseline level of shortness of breath.  His baseline carvedilol was continued in the hospital.  Spironolactone was restarted outpatient but losartan was initially held-we opted to restart today since had recovery and kidney function  CAD in native artery Stable shortness of breath likely CHF related and very occasional twinge of chest pain with lifting/motion  but no worsening substantially from baseline (he wants to hold off on cardiology visit for now unless worsens)-denies chest pain or shortness of breath.  Continue simvastatin and Coumadin-remain on aspirin as well per cardiology  DM (diabetes mellitus) type II, controlled, with peripheral vascular disorder ([place]) Sugars were higher after hospitalization after metformin was held-once restarted sugars have trended back down-A1c looked excellent today.  Continue current medication  Recommended follow up: Return in about 4 months (around 06/01/2022) for followup or sooner if needed.Schedule b4 you leave. Future Appointments  Date Time Provider [place]  02/11/2022  9:30 AM [facility] PA/NP [facility] None  02/12/2022 11:00 AM [facility] COUMADIN CLINIC [facility]  02/17/2022 10:30 AM Armbruster, Carlota Raspberry, MD [facility]  05/02/2022  3:35 PM [facility] DEVICE REMOTES [facility]  06/02/2022  3:00 PM Marin Olp, MD [facility]  09/12/2022 11:00 AM [facility] HEALTH COACH [facility]  10/24/2022 11:15 AM [facility] PHARMACIST [facility]    Lab/Order associations:   ICD-10-CM   1. Gastrointestinal hemorrhage with melena  K92.1     2. DM (diabetes mellitus) type II, controlled, with peripheral vascular disorder (HCC)  E11.51 CBC with Differential/Platelet    Comprehensive metabolic panel    Hemoglobin A1c    3. Apical mural thrombus  I51.3     4. Chronic systolic heart failure (HCC)  I50.22     5. CAD in native artery  I25.10       Meds ordered this encounter  Medications   losartan (COZAAR) 50 MG tablet    Sig: TAKE 1 TABLET BY MOUTH DAILY. GENERIC EQUIVALENT FOR COZAAR    Dispense:  90 tablet    Refill:  3    Return precautions advised.  Garret Reddish, MD

## 2022-01-31 ENCOUNTER — Ambulatory Visit: Payer: Medicare Other | Attending: Internal Medicine

## 2022-01-31 DIAGNOSIS — I255 Ischemic cardiomyopathy: Secondary | ICD-10-CM

## 2022-01-31 LAB — COMPREHENSIVE METABOLIC PANEL
ALT: 13 U/L (ref 0–53)
AST: 17 U/L (ref 0–37)
Albumin: 4.1 g/dL (ref 3.5–5.2)
Alkaline Phosphatase: 61 U/L (ref 39–117)
BUN: 33 mg/dL — ABNORMAL HIGH (ref 6–23)
CO2: 27 mEq/L (ref 19–32)
Calcium: 9.2 mg/dL (ref 8.4–10.5)
Chloride: 106 mEq/L (ref 96–112)
Creatinine, Ser: 1.37 mg/dL (ref 0.40–1.50)
GFR: 45.59 mL/min — ABNORMAL LOW (ref >60.00)
Glucose, Bld: 117 mg/dL — ABNORMAL HIGH (ref 70–99)
Potassium: 4.6 mEq/L (ref 3.5–5.1)
Sodium: 141 mEq/L (ref 135–145)
Total Bilirubin: 0.4 mg/dL (ref 0.2–1.2)
Total Protein: 6.8 g/dL (ref 6.0–8.3)

## 2022-01-31 LAB — CBC WITH DIFFERENTIAL/PLATELET
Basophils Absolute: 0.1 10*3/uL (ref 0.0–0.1)
Basophils Relative: 0.7 % (ref 0.0–3.0)
Eosinophils Absolute: 0.2 10*3/uL (ref 0.0–0.7)
Eosinophils Relative: 2 % (ref 0.0–5.0)
HCT: 41.4 % (ref 39.0–52.0)
Hemoglobin: 13.8 g/dL (ref 13.0–17.0)
Lymphocytes Relative: 37 % (ref 12.0–46.0)
Lymphs Abs: 2.8 10*3/uL (ref 0.7–4.0)
MCHC: 33.4 g/dL (ref 30.0–36.0)
MCV: 93.1 fl (ref 78.0–100.0)
Monocytes Absolute: 0.5 10*3/uL (ref 0.1–1.0)
Monocytes Relative: 7 % (ref 3.0–12.0)
Neutro Abs: 4.1 10*3/uL (ref 1.4–7.7)
Neutrophils Relative %: 53.3 % (ref 43.0–77.0)
Platelets: 154 10*3/uL (ref 150.0–400.0)
RBC: 4.45 Mil/uL (ref 4.22–5.81)
RDW: 13 % (ref 11.5–15.5)
WBC: 7.6 10*3/uL (ref 4.0–10.5)

## 2022-01-31 LAB — HEMOGLOBIN A1C: Hgb A1c MFr Bld: 6.2 % (ref 4.6–6.5)

## 2022-01-31 NOTE — Assessment & Plan Note (Addendum)
Sugars were higher after hospitalization after metformin was held-once restarted sugars have trended back down-A1c looked excellent today.  Continue current medication

## 2022-01-31 NOTE — Assessment & Plan Note (Signed)
Primary reason for Coumadin-continue current medication and close follow-up with our Coumadin clinic

## 2022-01-31 NOTE — Assessment & Plan Note (Addendum)
It appears GI bleed may have been related to an internal hemorrhoid which could have been triggered by recent constipation (colonoscopy while hospitalized with 1 polyp removed, diverticulosis noted but no active bleeding and nonbleeding internal hemorrhoid)-patient has been doing well at home with suppositories and denies recurrent bleeding.  Certainly his baseline Coumadin contributes but we opted to continue at this time.  Has close follow-up with GI-scheduled 02/17/2022 and I reinforced the importance of keeping this visit.  Also reinforced importance of keeping Coumadin (underlying apical mural thrombus) clinic on 02/12/2022 to ensure INR therapeutic but not supratherapeutic which could contribute to risk -He agrees to let us know immediately if recurrent GI bleed and there is some consideration of banding but seems to be doing better with suppository

## 2022-01-31 NOTE — Assessment & Plan Note (Addendum)
Patient follows closely with cardiology-he did have AKI in the hospital and losartan and Aldactone were held.  His Lasix was only as needed before hospitalization and this was monitored without Lasix in the hospital.  He has baseline level of shortness of breath.  His baseline carvedilol was continued in the hospital.  Spironolactone was restarted outpatient but losartan was initially held-we opted to restart today since had recovery and kidney function

## 2022-01-31 NOTE — Assessment & Plan Note (Addendum)
Stable shortness of breath likely CHF related and very occasional twinge of chest pain with lifting/motion but no worsening substantially from baseline (he wants to hold off on cardiology visit for now unless worsens)-denies chest pain or shortness of breath.  Continue simvastatin and Coumadin-remain on aspirin as well per cardiology

## 2022-02-05 LAB — CUP PACEART REMOTE DEVICE CHECK
Battery Remaining Longevity: 37 mo
Battery Voltage: 2.93 V
Brady Statistic RV Percent Paced: 0.15 %
Date Time Interrogation Session: 20231227132756
HighPow Impedance: 72 Ohm
Implantable Lead Connection Status: 753985
Implantable Lead Implant Date: 20150422
Implantable Lead Location: 753860
Implantable Lead Model: 6935
Implantable Pulse Generator Implant Date: 20150422
Lead Channel Impedance Value: 399 Ohm
Lead Channel Impedance Value: 475 Ohm
Lead Channel Pacing Threshold Amplitude: 0.5 V
Lead Channel Pacing Threshold Pulse Width: 0.4 ms
Lead Channel Sensing Intrinsic Amplitude: 12.125 mV
Lead Channel Sensing Intrinsic Amplitude: 12.125 mV
Lead Channel Setting Pacing Amplitude: 2 V
Lead Channel Setting Pacing Pulse Width: 0.4 ms
Lead Channel Setting Sensing Sensitivity: 0.3 mV

## 2022-02-11 ENCOUNTER — Ambulatory Visit (HOSPITAL_COMMUNITY)
Admission: RE | Admit: 2022-02-11 | Discharge: 2022-02-11 | Disposition: A | Discharge status: Home / Self Care | Payer: BLUE CROSS/BLUE SHIELD | Source: Ambulatory Visit | Attending: Family Medicine | Admitting: Family Medicine

## 2022-02-11 ENCOUNTER — Encounter (HOSPITAL_COMMUNITY): Payer: Self-pay

## 2022-02-11 VITALS — BP 144/76 | HR 79 | Wt 222.0 lb

## 2022-02-11 DIAGNOSIS — K5790 Diverticulosis of intestine, part unspecified, without perforation or abscess without bleeding: Secondary | ICD-10-CM | POA: Diagnosis not present

## 2022-02-11 DIAGNOSIS — I110 Hypertensive heart disease with heart failure: Secondary | ICD-10-CM | POA: Insufficient documentation

## 2022-02-11 DIAGNOSIS — Z7901 Long term (current) use of anticoagulants: Secondary | ICD-10-CM | POA: Insufficient documentation

## 2022-02-11 DIAGNOSIS — E7439 Other disorders of intestinal carbohydrate absorption: Secondary | ICD-10-CM | POA: Diagnosis not present

## 2022-02-11 DIAGNOSIS — Z79899 Other long term (current) drug therapy: Secondary | ICD-10-CM | POA: Diagnosis not present

## 2022-02-11 DIAGNOSIS — I2510 Atherosclerotic heart disease of native coronary artery without angina pectoris: Secondary | ICD-10-CM | POA: Diagnosis not present

## 2022-02-11 DIAGNOSIS — I255 Ischemic cardiomyopathy: Secondary | ICD-10-CM | POA: Insufficient documentation

## 2022-02-11 DIAGNOSIS — Z951 Presence of aortocoronary bypass graft: Secondary | ICD-10-CM | POA: Diagnosis not present

## 2022-02-11 DIAGNOSIS — Z955 Presence of coronary angioplasty implant and graft: Secondary | ICD-10-CM | POA: Diagnosis not present

## 2022-02-11 DIAGNOSIS — I5022 Chronic systolic (congestive) heart failure: Secondary | ICD-10-CM | POA: Diagnosis not present

## 2022-02-11 DIAGNOSIS — I513 Intracardiac thrombosis, not elsewhere classified: Secondary | ICD-10-CM

## 2022-02-11 DIAGNOSIS — I10 Essential (primary) hypertension: Secondary | ICD-10-CM | POA: Diagnosis not present

## 2022-02-11 DIAGNOSIS — K648 Other hemorrhoids: Secondary | ICD-10-CM | POA: Insufficient documentation

## 2022-02-11 DIAGNOSIS — Z8719 Personal history of other diseases of the digestive system: Secondary | ICD-10-CM

## 2022-02-11 DIAGNOSIS — E785 Hyperlipidemia, unspecified: Secondary | ICD-10-CM | POA: Diagnosis not present

## 2022-02-11 DIAGNOSIS — I252 Old myocardial infarction: Secondary | ICD-10-CM | POA: Insufficient documentation

## 2022-02-11 DIAGNOSIS — Z8249 Family history of ischemic heart disease and other diseases of the circulatory system: Secondary | ICD-10-CM | POA: Insufficient documentation

## 2022-02-11 DIAGNOSIS — Z95810 Presence of automatic (implantable) cardiac defibrillator: Secondary | ICD-10-CM | POA: Insufficient documentation

## 2022-02-11 DIAGNOSIS — R0602 Shortness of breath: Secondary | ICD-10-CM | POA: Diagnosis not present

## 2022-02-11 MED ORDER — NITROGLYCERIN 0.4 MG SL SUBL: 0.4000 mg | SUBLINGUAL_TABLET | SUBLINGUAL | 3 refills | Status: AC | PRN

## 2022-02-11 NOTE — Patient Instructions (Addendum)
Thank you for coming in today  No labs today   EKG done today  You have been sent in a prescription for Nitroglycerin as needed 1 tablet sublingual every 5 minutes for 3 doses then after that and there is no relief call 911  Your physician recommends that you schedule a follow-up appointment in:  Keep follow up with Dr. Haroldine Laws   Do the following things EVERYDAY: Weigh yourself in the morning before breakfast. Write it down and keep it in a log. Take your medicines as prescribed Eat low salt foods--Limit salt (sodium) to 2000 mg per day.  Stay as active as you can everyday Limit all fluids for the day to less than 2 liters  At the [facility], you and your health needs are our priority. As part of our continuing mission to provide you with exceptional heart care, we have created designated Provider Care Teams. These Care Teams include your primary Cardiologist (physician) and Advanced Practice Providers (APPs- Physician Assistants and Nurse Practitioners) who all work together to provide you with the care you need, when you need it.   You may see any of the following providers on your designated Care Team at your next follow up: Dr Glori Bickers Dr Loralie Champagne Dr. Roxana Hires, NP Lyda Jester, [place] [facility] [place], [place] Forestine Na, NP Audry Riles, PharmD   Please be sure to bring in all your medications bottles to every appointment.    If you have any questions or concerns before your next appointment please send [place] a message through [place] or call our office at [phone].    TO LEAVE A MESSAGE FOR THE NURSE SELECT OPTION 2, PLEASE LEAVE A MESSAGE INCLUDING: YOUR NAME DATE OF BIRTH CALL BACK NUMBER REASON FOR CALL**this is important as we prioritize the call backs  YOU WILL RECEIVE A CALL BACK THE SAME DAY AS LONG AS YOU CALL BEFORE 4:00 PM  .

## 2022-02-12 ENCOUNTER — Ambulatory Visit (INDEPENDENT_AMBULATORY_CARE_PROVIDER_SITE_OTHER): Payer: BLUE CROSS/BLUE SHIELD

## 2022-02-12 DIAGNOSIS — Z7901 Long term (current) use of anticoagulants: Secondary | ICD-10-CM

## 2022-02-12 LAB — POCT INR: INR: 2.4 (ref 2.0–3.0)

## 2022-02-12 NOTE — Patient Instructions (Addendum)
Pre visit review using our clinic review tool, if applicable. No additional management support is needed unless otherwise documented below in the visit note.  Continue 1 tablet daily except 1/2 tablet on Mondays and Thursdays.  Recheck in 6 weeks. 

## 2022-02-12 NOTE — Progress Notes (Signed)
Continue 1 tablet daily except 1/2 tablet on Mondays and Thursdays.  Recheck in 6 weeks. 

## 2022-02-12 NOTE — Progress Notes (Signed)
I have reviewed and agree with note, evaluation, plan.   Alayasia Breeding, MD  

## 2022-02-17 ENCOUNTER — Ambulatory Visit: Payer: BLUE CROSS/BLUE SHIELD | Admitting: Gastroenterology

## 2022-02-17 ENCOUNTER — Telehealth: Payer: Self-pay | Admitting: Pharmacist

## 2022-02-17 ENCOUNTER — Encounter: Payer: Self-pay | Admitting: Gastroenterology

## 2022-02-17 VITALS — BP 118/58 | HR 73 | Ht 71.0 in | Wt 224.0 lb

## 2022-02-17 DIAGNOSIS — K649 Unspecified hemorrhoids: Secondary | ICD-10-CM | POA: Diagnosis not present

## 2022-02-17 DIAGNOSIS — K625 Hemorrhage of anus and rectum: Secondary | ICD-10-CM

## 2022-02-17 DIAGNOSIS — Z7901 Long term (current) use of anticoagulants: Secondary | ICD-10-CM

## 2022-02-17 MED ORDER — CALMOL-4 76-10 % RE SUPP: RECTAL | 0 refills | Status: DC

## 2022-02-17 NOTE — Progress Notes (Unsigned)
Care Management & Coordination Services Pharmacy Team  Reason for Encounter: Diabetes  Contacted patient on 02/18/2022 to discuss diabetes disease state.   Recent office visits:  01/30/2022 OV (PCP) Marin Olp, MD; no medication changes indicated.  11/01/2021 OV (Fam Med) Jeanie Sewer, NP; no medication changes indicated.  Recent consult visits:  None  Hospital visits:  01/13/2022 ED to Hospital Admission due to Acute GI bleed Admit date: 01/13/2022 Discharge date: 01/17/2022 -Hold losartan and Aldactone because of AKI   Medications: Outpatient Encounter Medications as of 02/17/2022  Medication Sig   acetaminophen (TYLENOL) 500 MG tablet Take 500 mg by mouth at bedtime.    aspirin 81 MG tablet Take 1 tablet (81 mg total) by mouth every evening.   Blood Glucose Monitoring Suppl (ONE TOUCH ULTRA 2) w/Device KIT Use to test blood sugars daily. Dx: E11.9   carvedilol (COREG) 3.125 MG tablet Take 3.125 mg by mouth 2 (two) times daily with a meal.   Cholecalciferol (VITAMIN D3) 2000 UNITS TABS Take 2,000 Units by mouth daily.   diphenhydramine-acetaminophen (TYLENOL PM) 25-500 MG TABS tablet Take 1 tablet by mouth at bedtime as needed (For sleep).   folic acid (FOLVITE) 185 MCG tablet Take 400 mcg by mouth daily.   furosemide (LASIX) 40 MG tablet Take 1 tablet (40 mg total) by mouth daily as needed. (Patient taking differently: Take 40 mg by mouth daily as needed for fluid.)   Glucosamine HCl 1500 MG TABS Take 1,500 mg by mouth every evening.    glucose blood test strip Use to test blood sugars daily. Dx: E11.9   Lancets (ONETOUCH DELICA PLUS [id number]) MISC Use to test blood sugars daily. Dx: E11.9   losartan (COZAAR) 50 MG tablet TAKE 1 TABLET BY MOUTH DAILY. GENERIC EQUIVALENT FOR COZAAR   Melatonin 5 MG CHEW Chew 1 tablet by mouth daily as needed.   metFORMIN (GLUCOPHAGE-XR) 500 MG 24 hr tablet TAKE 1 TABLET(500 MG) BY MOUTH DAILY WITH BREAKFAST (Patient taking differently:  Take 500 mg by mouth daily with breakfast.)   Multiple Vitamin (MULTIVITAMIN WITH MINERALS) TABS Take 1 tablet by mouth daily.    nitroGLYCERIN (NITROSTAT) 0.4 MG SL tablet Place 1 tablet (0.4 mg total) under the tongue every 5 (five) minutes as needed for chest pain. 1 tablet every 5 minutes for 3 dose only   OneTouch Delica Lancets 37C MISC USE TO TEST BLOOD SUGAR DAILY   pantoprazole (PROTONIX) 40 MG tablet Take 1 tablet (40 mg total) by mouth daily.   simvastatin (ZOCOR) 40 MG tablet Take 0.5 tablets (20 mg total) by mouth at bedtime.   spironolactone (ALDACTONE) 25 MG tablet Take 0.5 tablets (12.5 mg total) by mouth daily.   tamsulosin (FLOMAX) 0.4 MG CAPS capsule Take 0.4 mg by mouth at bedtime.   tiZANidine (ZANAFLEX) 2 MG tablet TAKE 1 TABLET(2 MG) BY MOUTH DAILY AS NEEDED FOR MUSCLE SPASMS. DO NOT DRIVE FOR 8 HOURS AFTER USE (Patient taking differently: Take 2 mg by mouth every 6 (six) hours as needed for muscle spasms.)   triamcinolone cream (KENALOG) 0.1 % Apply 1 Application topically daily as needed (Foot rash).   warfarin (COUMADIN) 5 MG tablet TAKE ONE TABLET BY MOUTH ON MONDAY, WEDNESDAY, AND FRIDAY AND TAKE ONE-HALF TABLET ALL OTHER DAYS OR AS DIRECTED (Patient taking differently: Take 2.5-5 mg by mouth See admin instructions. Take 5 mg tablet by mouth every day except on Mon and Thursdays take 2.5 mg by mouth per patient)   No facility-administered  encounter medications on file as of 02/17/2022.    Recent Relevant Labs: Lab Results  Component Value Date/Time   HGBA1C 6.2 01/30/2022 04:22 PM   HGBA1C 6.4 09/09/2021 12:06 PM   MICROALBUR 1.3 05/17/2020 10:11 AM   MICROALBUR 1.1 01/11/2016 08:36 AM    Kidney Function Lab Results  Component Value Date/Time   CREATININE 1.37 01/30/2022 04:22 PM   CREATININE 1.33 (H) 01/17/2022 04:00 AM   GFR 45.59 (L) 01/30/2022 04:22 PM   GFRNONAA 51 (L) 01/17/2022 04:00 AM   GFRAA 46 (L) 10/01/2018 02:26 PM    Current antihyperglycemic  regimen:  Metformin 500 mg daily   Patient verbally confirms he is taking the above medications as directed. No  What diet changes have been made to improve diabetes control? Patient states he has been eating "special frozen meals" that are low carb.  What recent interventions/DTPs have been made to improve glycemic control:  No recent interventions or DTPs.  Have there been any recent hospitalizations or ED visits since last visit with PharmD? Yes  Patient denies hypoglycemic symptoms.  Patient denies hyperglycemic symptoms.  How often are you checking your blood sugar? once daily  What are your blood sugars ranging?  Fasting: 150, 145, 135  During the week, how often does your blood glucose drop below 70? Never  Are you checking your feet daily/regularly? Yes  Adherence Review: Is the patient currently on a STATIN medication? Yes Is the patient currently on ACE/ARB medication? Yes Does the patient have >5 day gap between last estimated fill dates? No  Star Rating Drugs:  Losartan 50 mg last filled 01/31/2022 90 DS Metformin ER 500 mg last filled 01/06/2022 90 DS Simvastatin 40 mg last filled 12/02/2021 90 DS  Care Gaps: Annual wellness visit in last year? Yes Last eye exam / retinopathy screening: 07/25/2021 Last diabetic foot exam: 01/31/2023  Future Appointments  Date Time Provider [place]  02/17/2022 10:30 AM Armbruster, Carlota Raspberry, MD [facility]  03/26/2022 10:30 AM [facility] COUMADIN CLINIC [facility]  05/02/2022  3:35 PM [facility] DEVICE REMOTES [facility]  06/02/2022  3:00 PM Marin Olp, MD [facility]  09/12/2022 11:00 AM [facility] HEALTH COACH [facility]  10/24/2022 11:15 AM [facility] CCM PHARMACIST [facility]   April D Calhoun, [place] Pharmacist Assistant [phone]

## 2022-02-17 NOTE — Patient Instructions (Signed)
_______________________________________________________  If you are age 87 or older, your body mass index should be between 23-30. Your Body mass index is 31.24 kg/m. If this is out of the aforementioned range listed, please consider follow up with your Primary Care Provider.  If you are age 68 or younger, your body mass index should be between 19-25. Your Body mass index is 31.24 kg/m. If this is out of the aformentioned range listed, please consider follow up with your Primary Care Provider.   ________________________________________________________  The [place] [facility] providers would like to encourage you to use [facility] to communicate with providers for non-urgent requests or questions.  Due to long hold times on the telephone, sending your provider a message by [facility] may be a faster and more efficient way to get a response.  Please allow 48 business hours for a response.  Please remember that this is for non-urgent requests.  _______________________________________________________  Continue fiber supplement daily  We have given you samples of the following medication to take: Calmol 4 suppositories - use as directed as needed  Thank you for entrusting me with your care and for choosing [employer], Dr. Montauk Cellar

## 2022-02-17 NOTE — Progress Notes (Signed)
HPI :  89 year old male here for a posthospitalization follow-up.  Recall he has a history of BPH, CAD, ischemic cardiomyopathy, chronic combined systolic and diastolic heart failure on coumadin, diverticulosis, who is admitted to the hospital in early December with rectal bleeding.  He states initially he thought this was mostly dark blood that was bright red when he wiped himself on the toilet paper.  In the ED when our team evaluated him there was frank hematochezia on rectal exam.  It was thought that he was likely having a lower GI bleed.  His hemoglobin remained relatively normal during initial intake.  He had a colonoscopy done with Dr. Silverio Decamp on December 7, poor prep noted without blood however diverticulosis, 1 small adenoma, internal hemorrhoids.  It was suspected perhaps he had a hemorrhoidal bleed versus diverticular source. EGD was not pursued.  He was started on a fiber supplement, Benefiber to use every day as well as given 1 week of Anusol suppositories for hemorrhoids.  He states he has been doing really well since the discharge.  Denies any constipation.  Previously states he had some straining with constipation prior to this happening.  He has not had any further bleeding that he can see.  Follow-up hemoglobin on December 22 was 13.8 with a normal MCV.  He has some questions about why part of his hospitalization was not covered by insurance which I looked over his paperwork, he needs to call his insurance company and the hospital to help sort out what is the problem (it appears charges put in by the hospitalist were not covered).   Prior workup: EGD 10/2018 as follows:   - Esophagogastric landmarks identified. - 1 cm hiatal hernia. - Normal esophagus - A few gastric erosions, no focal ulcers, slightly nodular mucosa in the antrum - biopsies taken to rule out H pylori - Normal stomach otherwise - A single non-bleeding angiodysplastic lesion in the duodenum. Treated with argon  plasma coagulation (APC). - Mucosal changes in the duodenum - normal variant versus adenomatous change. Biopsied.   1. Duodenum, Biopsy - PEPTIC DUODENITIS. 2. Stomach, biopsy, intrabody - GASTRIC ANTRAL MUCOSA WITH MILD REACTIVE GASTROPATHY. - GASTRIC OXYNTIC MUCOSA WITH MILD CHRONIC GASTRITIS. - WARTHIN-STARRY STAIN IS NEGATIVE FOR HELICOBACTER PYLORI.   Last full colonoscopy with a good prep 2004, noted to have diverticulosis.     Colonoscopy 01/16/22: Dr. Silverio Decamp - poor prep The perianal and digital rectal examinations were normal. A 5 mm polyp was found in the ascending colon. The polyp was sessile. The polyp was removed with a cold snare. Resection and retrieval were complete. Multiple large-mouthed diverticula were found in the sigmoid colon and descending colon. Non-bleeding internal hemorrhoids were found during retroflexion. The hemorrhoids were medium-sized. The exam was otherwise without abnormality with brown stool, no evidence of recent GI bleed or active bleed.  FINAL MICROSCOPIC DIAGNOSIS:   A. ASCENDING COLON POLYP, BIOPSY:  - Tubular adenoma  - Negative for high-grade dysplasia or malignancy     Past Medical History:  Diagnosis Date   Anxiety    BPH (benign prostatic hypertrophy)    CAD (coronary artery disease)    s/p CABG   CHF (congestive heart failure) (HCC)    Diabetes 1.5, managed as type 2 ([place])    Diverticulosis    Diverticulosis of colon without hemorrhage 10/22/2007   Qualifier: Diagnosis of  By: Linna Darner MD, [name]     DOE (dyspnea on exertion)    Excess weight    Fasting  hyperglycemia    Generalized weakness    GERD (gastroesophageal reflux disease)    Heart attack ([place]) 2001   Heart failure    CHF due to ischemic CM   History of kidney stones    Hyperlipidemia    Hypertension    Ischemic cardiomyopathy    Microscopic hematuria    [facility]   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     Past Surgical History:   Procedure Laterality Date   APPENDECTOMY     BIOPSY  10/21/2018   Procedure: BIOPSY;  Surgeon: Yetta Flock, MD;  Location: [facility];  Service: Gastroenterology;;   [place]  2005   [employer]; s/p ICD gen change and RV lead revision April 2015   CATARACT EXTRACTION  2008   bilateral with lens implant   COLONOSCOPY  2004   Tics; [facility]   COLONOSCOPY WITH PROPOFOL N/A 01/16/2022   Procedure: COLONOSCOPY WITH PROPOFOL;  Surgeon: Mauri Pole, MD;  Location: [facility];  Service: Gastroenterology;  Laterality: N/A;   CORONARY ARTERY BYPASS GRAFT  2000   1 vessel   CYSTOSCOPY  06/2012   Dr [name]   ESOPHAGOGASTRODUODENOSCOPY (EGD) WITH PROPOFOL N/A 10/21/2018   Procedure: ESOPHAGOGASTRODUODENOSCOPY (EGD) WITH PROPOFOL;  Surgeon: Yetta Flock, MD;  Location: [facility];  Service: Gastroenterology;  Laterality: N/A;   HOT HEMOSTASIS N/A 10/21/2018   Procedure: HOT HEMOSTASIS (ARGON PLASMA COAGULATION/BICAP);  Surgeon: Yetta Flock, MD;  Location: [facility];  Service: Gastroenterology;  Laterality: N/A;   IMPLANTABLE CARDIOVERTER DEFIBRILLATOR (ICD) GENERATOR CHANGE N/A 06/01/2013   Procedure: ICD GENERATOR CHANGE;  Surgeon: [name], MD;  Location: [facility] CATH LAB;  Service: Cardiovascular;  Laterality: N/A;   LEAD REVISION N/A 06/01/2013   Procedure: LEAD REVISION;  Surgeon: [name], MD;  Location: [facility] CATH LAB;  Service: Cardiovascular;  Laterality: N/A;   PILONIDAL CYST EXCISION     POLYPECTOMY  01/16/2022   Procedure: POLYPECTOMY;  Surgeon: Mauri Pole, MD;  Location: [facility];  Service: Gastroenterology;;   RIGHT/LEFT HEART CATH AND CORONARY/GRAFT ANGIOGRAPHY N/A 06/12/2017   Procedure: RIGHT/LEFT HEART CATH AND CORONARY/GRAFT ANGIOGRAPHY;  Surgeon: [name], MD;  Location: [facility];  Service: Cardiovascular;  Laterality: N/A;   TRANSTHORACIC  ECHOCARDIOGRAM  08/29/2005   Family History  Problem Relation Age of Onset   Hypertension Father    Heart attack Father 68   Diabetes Brother        X 2   Nephrolithiasis Brother    Heart attack Brother 52   Heart attack Paternal Uncle 15   Sudden death Paternal Grandfather 34       ? heat stroke   Lung cancer Sister        "Asian lung cancer"   Stroke Neg Hx    Colon cancer Neg Hx    Esophageal cancer Neg Hx    Rectal cancer Neg Hx    Stomach cancer Neg Hx    Social History   Tobacco Use   Smoking status: Never    Passive exposure: Yes   Smokeless tobacco: Never  Vaping Use   Vaping Use: Never used  Substance Use Topics   Alcohol use: No   Drug use: No   Current Outpatient Medications  Medication Sig Dispense Refill   acetaminophen (TYLENOL) 500 MG tablet Take 500 mg by mouth at bedtime.      aspirin 81 MG tablet  Take 1 tablet (81 mg total) by mouth every evening.     Blood Glucose Monitoring Suppl (ONE TOUCH ULTRA 2) w/Device KIT Use to test blood sugars daily. Dx: E11.9 1 kit 3   carvedilol (COREG) 3.125 MG tablet Take 3.125 mg by mouth 2 (two) times daily with a meal.     Cholecalciferol (VITAMIN D3) 2000 UNITS TABS Take 2,000 Units by mouth daily.     diphenhydramine-acetaminophen (TYLENOL PM) 25-500 MG TABS tablet Take 1 tablet by mouth at bedtime as needed (For sleep).     folic acid (FOLVITE) 300 MCG tablet Take 400 mcg by mouth daily.     furosemide (LASIX) 40 MG tablet Take 1 tablet (40 mg total) by mouth daily as needed. (Patient taking differently: Take 40 mg by mouth daily as needed for fluid.) 30 tablet 3   Glucosamine HCl 1500 MG TABS Take 1,500 mg by mouth every evening.      glucose blood test strip Use to test blood sugars daily. Dx: E11.9 100 each 12   Lancets (ONETOUCH DELICA PLUS [id number]) MISC Use to test blood sugars daily. Dx: E11.9 100 each 3   losartan (COZAAR) 50 MG tablet TAKE 1 TABLET BY MOUTH DAILY. GENERIC EQUIVALENT FOR COZAAR 90 tablet 3    Melatonin 5 MG CHEW Chew 1 tablet by mouth daily as needed.     metFORMIN (GLUCOPHAGE-XR) 500 MG 24 hr tablet TAKE 1 TABLET(500 MG) BY MOUTH DAILY WITH BREAKFAST (Patient taking differently: Take 500 mg by mouth daily with breakfast.) 30 tablet 5   Multiple Vitamin (MULTIVITAMIN WITH MINERALS) TABS Take 1 tablet by mouth daily.      nitroGLYCERIN (NITROSTAT) 0.4 MG SL tablet Place 1 tablet (0.4 mg total) under the tongue every 5 (five) minutes as needed for chest pain. 1 tablet every 5 minutes for 3 dose only 90 tablet 3   OneTouch Delica Lancets 26J MISC USE TO TEST BLOOD SUGAR DAILY 100 each 3   pantoprazole (PROTONIX) 40 MG tablet Take 1 tablet (40 mg total) by mouth daily. 30 tablet 0   simvastatin (ZOCOR) 40 MG tablet Take 0.5 tablets (20 mg total) by mouth at bedtime. 45 tablet 3   spironolactone (ALDACTONE) 25 MG tablet Take 0.5 tablets (12.5 mg total) by mouth daily. 90 tablet 3   tamsulosin (FLOMAX) 0.4 MG CAPS capsule Take 0.4 mg by mouth at bedtime.     tiZANidine (ZANAFLEX) 2 MG tablet TAKE 1 TABLET(2 MG) BY MOUTH DAILY AS NEEDED FOR MUSCLE SPASMS. DO NOT DRIVE FOR 8 HOURS AFTER USE (Patient taking differently: Take 2 mg by mouth every 6 (six) hours as needed for muscle spasms.) 90 tablet 0   triamcinolone cream (KENALOG) 0.1 % Apply 1 Application topically daily as needed (Foot rash).     vitamin B-12 (CYANOCOBALAMIN) 100 MCG tablet Take 100 mcg by mouth daily.     warfarin (COUMADIN) 5 MG tablet TAKE ONE TABLET BY MOUTH ON MONDAY, WEDNESDAY, AND FRIDAY AND TAKE ONE-HALF TABLET ALL OTHER DAYS OR AS DIRECTED (Patient taking differently: Take 2.5-5 mg by mouth See admin instructions. Take 5 mg tablet by mouth every day except on Mon and Thursdays take 2.5 mg by mouth per patient) 100 tablet 1   No current facility-administered medications for this visit.   Allergies  Allergen Reactions   Penicillins Rash and Other (See Comments)    Because of a history of documented adverse serious  drug reaction;Medi Alert bracelet  is recommended Has patient had a  PCN reaction causing immediate rash, facial/tongue/throat swelling, SOB or lightheadedness with hypotension: No Has patient had a PCN reaction causing severe rash involving mucus membranes or skin necrosis: No Has patient had a PCN reaction that required hospitalization: No Has patient had a PCN reaction occurring within the last 10 years: No If all of the above answers are "NO", t     Review of Systems: All systems reviewed and negative except where noted in HPI.      Latest Ref Rng & Units 01/30/2022    4:22 PM 01/16/2022    4:05 AM 01/14/2022    4:32 AM  CBC  WBC 4.0 - 10.5 K/uL 7.6  7.3  10.0   Hemoglobin 13.0 - 17.0 g/dL 13.8  13.1  13.4   Hematocrit 39.0 - 52.0 % 41.4  40.2  40.3   Platelets 150.0 - 400.0 K/uL 154.0  158  147     Physical Exam: BP (!) 118/58   Pulse 73   Ht '5\' 11"'$  (1.803 m)   Wt 224 lb (101.6 kg)   SpO2 97%   BMI 31.24 kg/m  Constitutional: Pleasant,well-developed, male in no acute distress. Neurological: Alert and oriented to person place and time. Psychiatric: Normal mood and affect. Behavior is normal.   ASSESSMENT: 89 y.o. male here for assessment of the following  1. Rectal bleeding   2. Hemorrhoids, unspecified hemorrhoid type   3. Anticoagulated    Admitted with likely lower GI bleed secondary to hemorrhoids or diverticulosis.  He reported seeing some dark stool and then red stool, on rectal exam he had gross hematochezia but never really dropped his hemoglobin.  I would think it very unlikely had an upper source and I think hemorrhoidal is more than likely the cause, in the setting of therapeutic INR.  Now on fiber supplementation and not having any straining, he has not had any further bleeding.  Completed 1 week course of Anusol.  Follow-up CBC looks good, we will continue to monitor for now.  He should take the fiber supplement every day moving forward to keep stools soft.   I provided him some free samples of Calmol 4 suppositories to use as needed.  If he has recurrent rectal bleeding he can contact me.  Given he is doing better with supportive care I would hold off on hemorrhoidal banding at this time.  He is on Coumadin and higher than average risk for hemorrhoid banding related bleeding, this would be considered only should he have recurrent significant bleeding that bothers him.  Given his age, no further surveillance colonoscopy recommended, he seems to be doing okay otherwise.  He can follow-up with me as needed, all questions answered he agrees with the plan.  PLAN: - continue fiber supplement daily - Calmol4 suppositories to use PRN and can get OTC - samples - call if recurrent symptoms - holding off on hemorrhoid banding at this time, only if persistent / recurrence, doing better with fiber - f/u PRN  Jolly Mango, MD [facility]

## 2022-02-20 NOTE — Progress Notes (Signed)
Remote ICD transmission.   

## 2022-03-21 ENCOUNTER — Encounter: Payer: Self-pay | Admitting: Internal Medicine

## 2022-03-26 ENCOUNTER — Ambulatory Visit (INDEPENDENT_AMBULATORY_CARE_PROVIDER_SITE_OTHER): Payer: Medicare Other

## 2022-03-26 DIAGNOSIS — Z7901 Long term (current) use of anticoagulants: Secondary | ICD-10-CM

## 2022-03-26 LAB — POCT INR: INR: 2.5 (ref 2.0–3.0)

## 2022-03-26 NOTE — Patient Instructions (Addendum)
Pre visit review using our clinic review tool, if applicable. No additional management support is needed unless otherwise documented below in the visit note.  Continue 1 tablet daily except 1/2 tablet on Mondays and Thursdays.  Recheck in 6 weeks.

## 2022-03-26 NOTE — Progress Notes (Signed)
Continue 1 tablet daily except 1/2 tablet on Mondays and Thursdays.  Recheck in 6 weeks.

## 2022-03-28 ENCOUNTER — Other Ambulatory Visit (HOSPITAL_COMMUNITY): Payer: Self-pay

## 2022-03-28 ENCOUNTER — Other Ambulatory Visit (HOSPITAL_COMMUNITY): Payer: Self-pay | Admitting: *Deleted

## 2022-03-28 DIAGNOSIS — E785 Hyperlipidemia, unspecified: Secondary | ICD-10-CM

## 2022-03-28 MED ORDER — SIMVASTATIN 40 MG PO TABS: 20.0000 mg | ORAL_TABLET | Freq: Every day | ORAL | 3 refills | Status: DC

## 2022-04-10 ENCOUNTER — Other Ambulatory Visit: Payer: Self-pay | Admitting: Family Medicine

## 2022-04-17 DIAGNOSIS — N200 Calculus of kidney: Secondary | ICD-10-CM | POA: Diagnosis not present

## 2022-04-17 DIAGNOSIS — N281 Cyst of kidney, acquired: Secondary | ICD-10-CM | POA: Diagnosis not present

## 2022-04-17 DIAGNOSIS — R3915 Urgency of urination: Secondary | ICD-10-CM | POA: Diagnosis not present

## 2022-04-17 DIAGNOSIS — N401 Enlarged prostate with lower urinary tract symptoms: Secondary | ICD-10-CM | POA: Diagnosis not present

## 2022-05-02 ENCOUNTER — Ambulatory Visit (INDEPENDENT_AMBULATORY_CARE_PROVIDER_SITE_OTHER): Payer: Medicare Other

## 2022-05-02 DIAGNOSIS — I255 Ischemic cardiomyopathy: Secondary | ICD-10-CM | POA: Diagnosis not present

## 2022-05-03 IMAGING — DX DG HIP (WITH OR WITHOUT PELVIS) 2-3V*R*
3 series · 3 of 3 positions shown · non-contrast
Comparison: None.

CLINICAL DATA: Fall, right femur pain

EXAM:
RIGHT FEMUR 2 VIEWS; DG HIP (WITH OR WITHOUT PELVIS) 2-3V RIGHT

[pelvis ap]
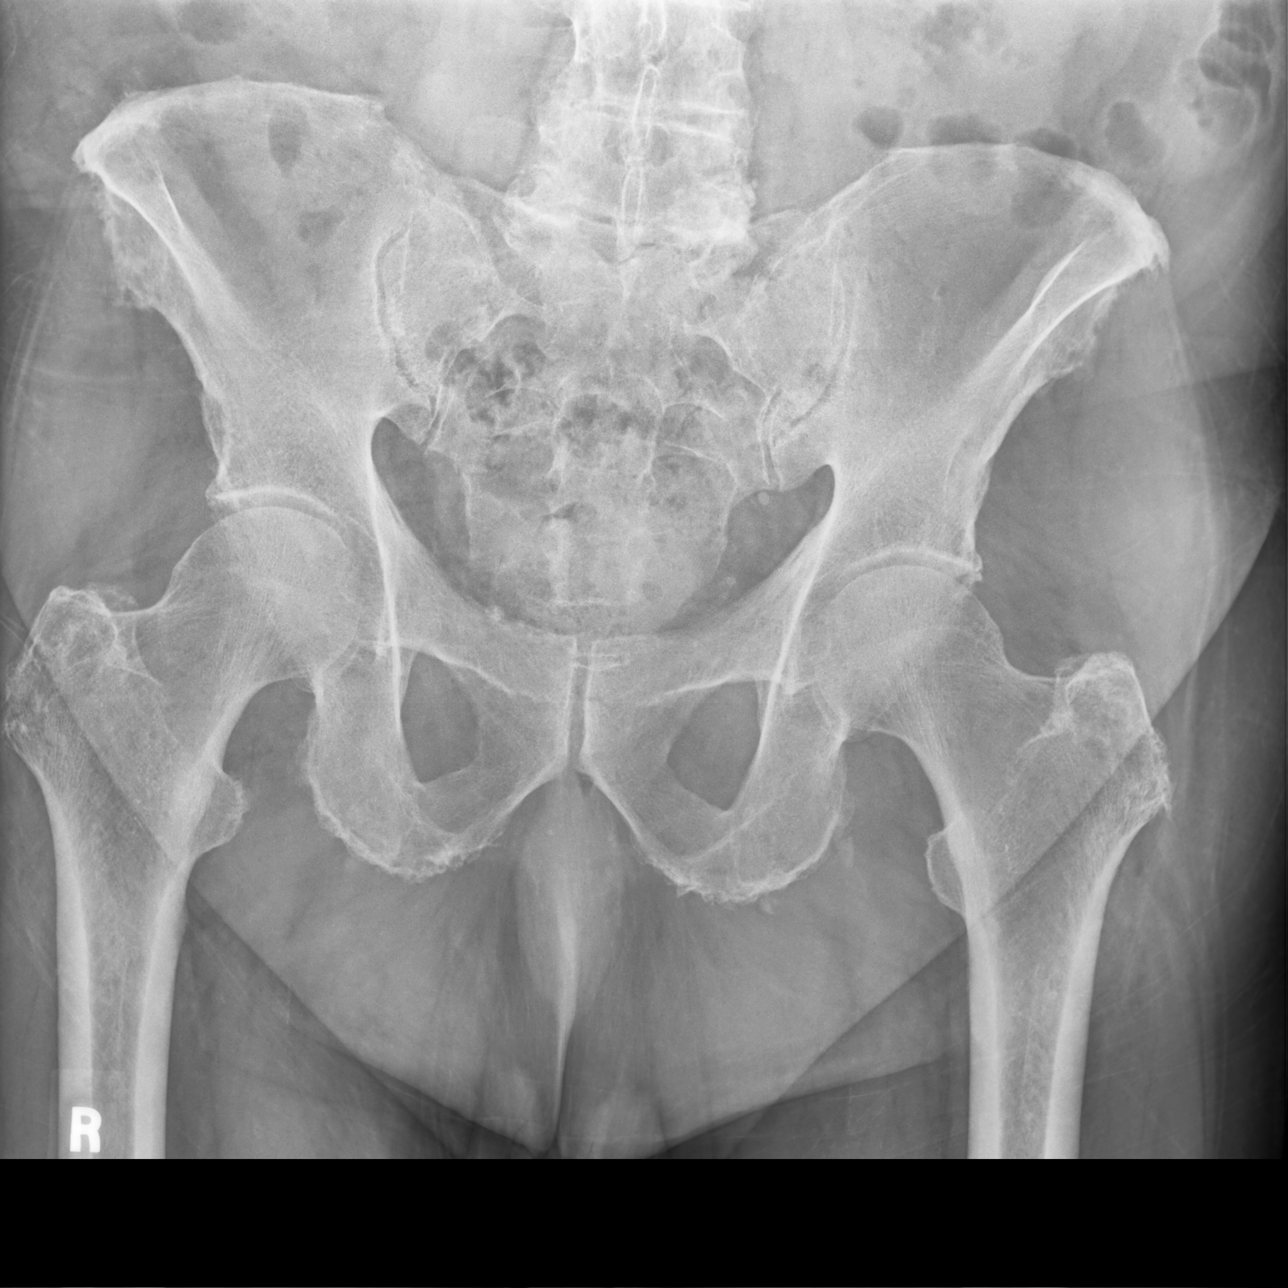

[hip joint ap]
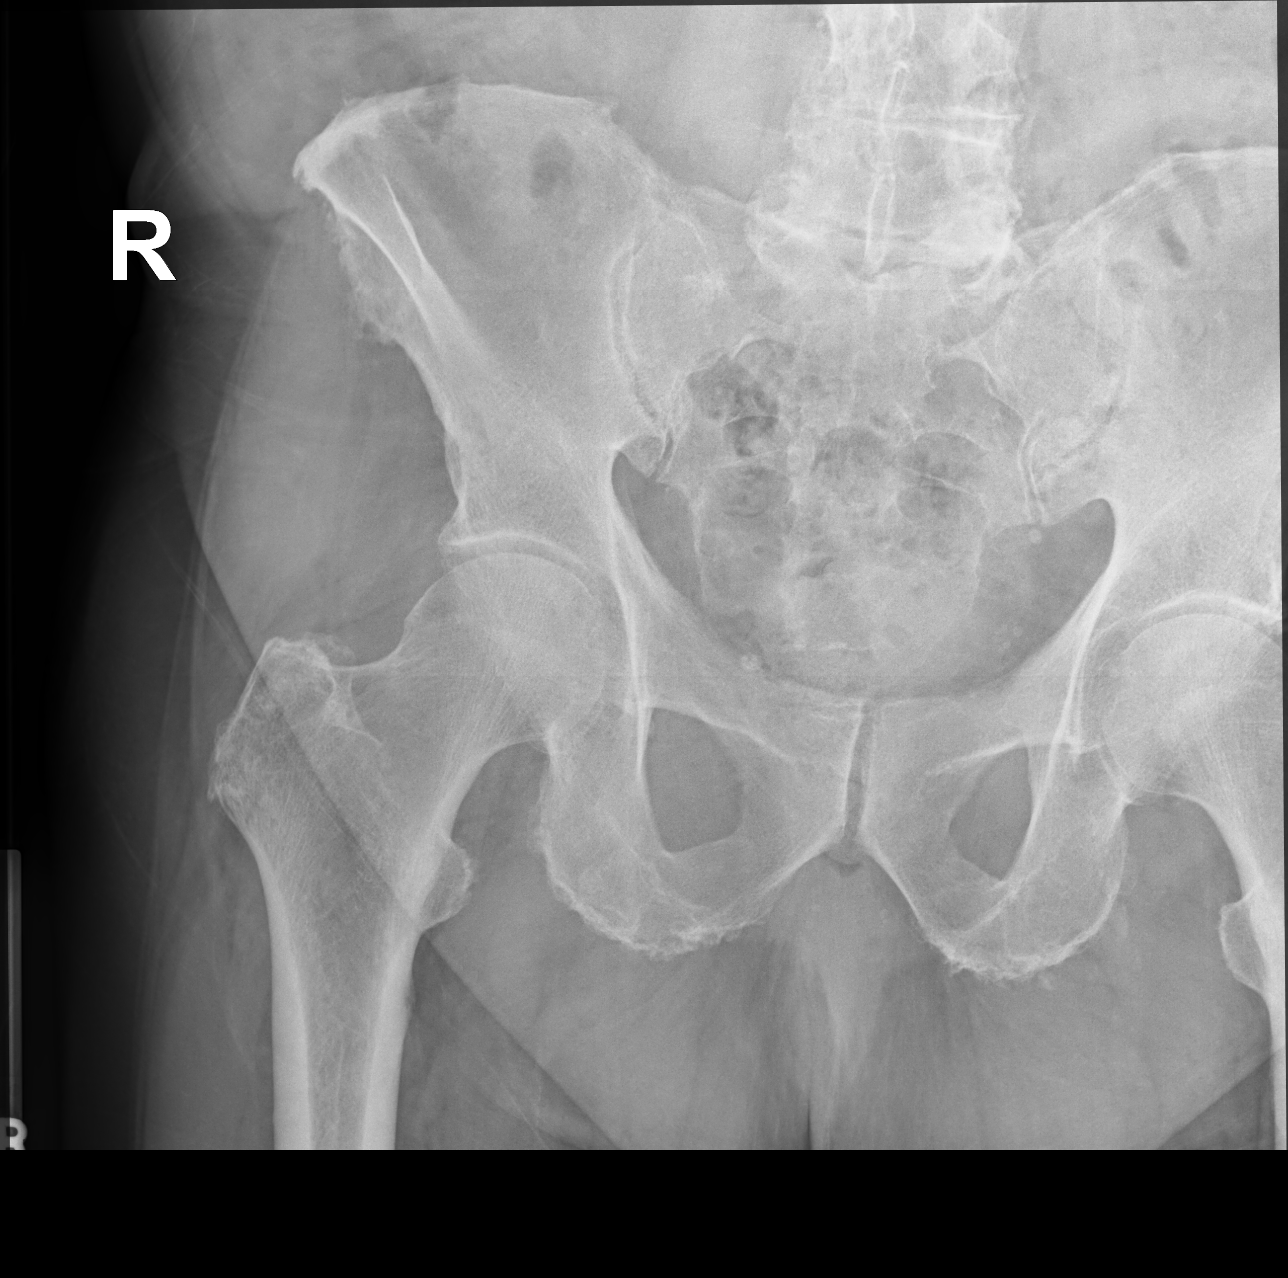

[hip joint (frog view)]
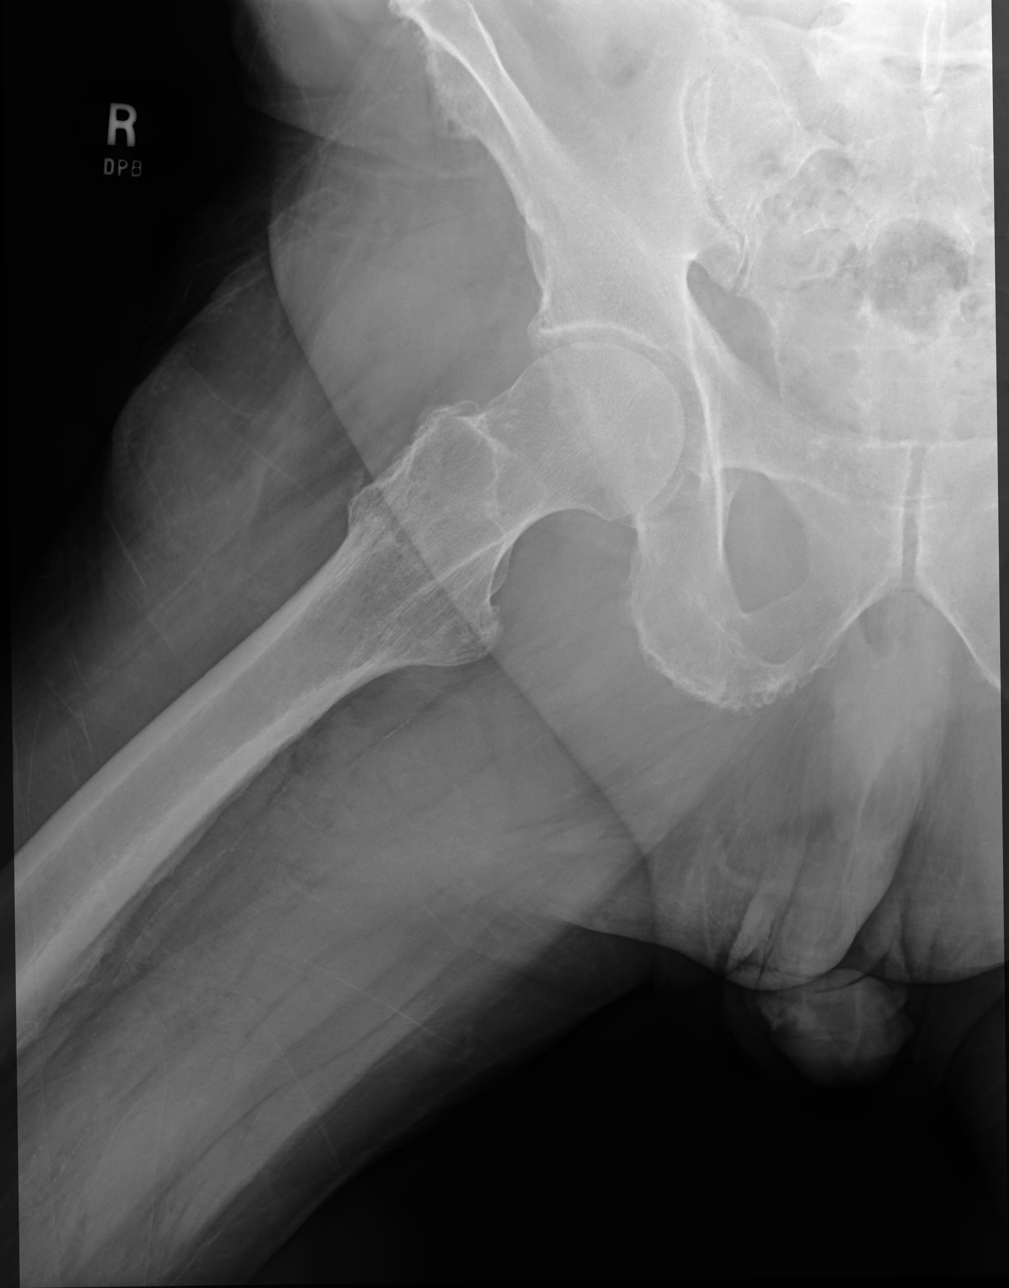

[3 of 3 positions shown; findings below may reference images not displayed]

FINDINGS: There is no evidence of displaced fracture or other focal bone
lesions. Soft tissues are unremarkable.
IMPRESSION: No displaced fracture or dislocation of the right hip or femur.

## 2022-05-05 LAB — CUP PACEART REMOTE DEVICE CHECK
Battery Remaining Longevity: 37 mo
Battery Voltage: 2.95 V
Brady Statistic RV Percent Paced: 0.07 %
Date Time Interrogation Session: 20240324093326
HighPow Impedance: 69 Ohm
Implantable Lead Connection Status: 753985
Implantable Lead Implant Date: 20150422
Implantable Lead Location: 753860
Implantable Lead Model: 6935
Implantable Pulse Generator Implant Date: 20150422
Lead Channel Impedance Value: 418 Ohm
Lead Channel Impedance Value: 475 Ohm
Lead Channel Pacing Threshold Amplitude: 0.625 V
Lead Channel Pacing Threshold Pulse Width: 0.4 ms
Lead Channel Sensing Intrinsic Amplitude: 13.375 mV
Lead Channel Sensing Intrinsic Amplitude: 13.375 mV
Lead Channel Setting Pacing Amplitude: 2 V
Lead Channel Setting Pacing Pulse Width: 0.4 ms
Lead Channel Setting Sensing Sensitivity: 0.3 mV

## 2022-05-07 ENCOUNTER — Ambulatory Visit: Payer: Medicare Other

## 2022-05-14 ENCOUNTER — Ambulatory Visit (INDEPENDENT_AMBULATORY_CARE_PROVIDER_SITE_OTHER): Payer: Medicare Other

## 2022-05-14 DIAGNOSIS — Z7901 Long term (current) use of anticoagulants: Secondary | ICD-10-CM

## 2022-05-14 LAB — POCT INR: INR: 2.9 (ref 2.0–3.0)

## 2022-05-14 NOTE — Patient Instructions (Addendum)
Pre visit review using our clinic review tool, if applicable. No additional management support is needed unless otherwise documented below in the visit note.  Continue 1 tablet daily except 1/2 tablet on Mondays and Thursdays.  Recheck in 6 weeks. 

## 2022-05-14 NOTE — Progress Notes (Signed)
I have reviewed and agree with note, evaluation, plan.   Arsen Mangione, MD  

## 2022-05-14 NOTE — Progress Notes (Signed)
Continue 1 tablet daily except 1/2 tablet on Mondays and Thursdays.  Recheck in 6 weeks. 

## 2022-05-15 ENCOUNTER — Other Ambulatory Visit: Payer: Self-pay | Admitting: Family Medicine

## 2022-05-15 DIAGNOSIS — Z7901 Long term (current) use of anticoagulants: Secondary | ICD-10-CM

## 2022-05-29 ENCOUNTER — Telehealth: Payer: Self-pay | Admitting: Family Medicine

## 2022-05-29 NOTE — Telephone Encounter (Signed)
Caller states faxed rx request on 05/22/22 for 90 days supply of metformin 500 mg to be sent to [facility] on [address].   Can you confirm you received this written request?

## 2022-05-30 MED ORDER — METFORMIN HCL ER 500 MG PO TB24: ORAL_TABLET | ORAL | 3 refills | Status: DC

## 2022-05-30 NOTE — Telephone Encounter (Signed)
Not sure if we got that request but I just sent in 90 day supply

## 2022-06-02 ENCOUNTER — Encounter: Payer: Self-pay | Admitting: Family Medicine

## 2022-06-02 ENCOUNTER — Ambulatory Visit (INDEPENDENT_AMBULATORY_CARE_PROVIDER_SITE_OTHER): Payer: Medicare Other | Admitting: Family Medicine

## 2022-06-02 VITALS — BP 117/76 | HR 77 | Temp 98.7°F | Resp 16 | Ht 71.0 in | Wt 223.2 lb

## 2022-06-02 DIAGNOSIS — I2510 Atherosclerotic heart disease of native coronary artery without angina pectoris: Secondary | ICD-10-CM | POA: Diagnosis not present

## 2022-06-02 DIAGNOSIS — I5022 Chronic systolic (congestive) heart failure: Secondary | ICD-10-CM | POA: Diagnosis not present

## 2022-06-02 DIAGNOSIS — Z7984 Long term (current) use of oral hypoglycemic drugs: Secondary | ICD-10-CM

## 2022-06-02 DIAGNOSIS — E1151 Type 2 diabetes mellitus with diabetic peripheral angiopathy without gangrene: Secondary | ICD-10-CM

## 2022-06-02 DIAGNOSIS — G720 Drug-induced myopathy: Secondary | ICD-10-CM

## 2022-06-02 DIAGNOSIS — I513 Intracardiac thrombosis, not elsewhere classified: Secondary | ICD-10-CM

## 2022-06-02 LAB — POCT GLYCOSYLATED HEMOGLOBIN (HGB A1C): Hemoglobin A1C: 5.9 % — AB (ref 4.0–5.6)

## 2022-06-02 NOTE — Progress Notes (Signed)
Phone [phone] In person visit   Subjective:   Joseph Malone is a 89 y.o. year old very pleasant male patient who presents for/with See problem oriented charting Chief Complaint  Patient presents with   Diabetes   Insomnia    Trouble falling and staying asleep   Past Medical History-  Patient Active Problem List   Diagnosis Date Noted   GI bleeding 01/13/2022    Priority: High   Apical mural thrombus 01/13/2022    Priority: High   Chronic systolic heart failure 12/09/2010    Priority: High   Cardiomyopathy, ischemic 10/02/2009    Priority: High   Automatic implantable cardioverter-defibrillator in situ 01/16/2009    Priority: High   DM (diabetes mellitus) type II, controlled, with peripheral vascular disorder 09/14/2008    Priority: High   CAD in native artery 09/25/2006    Priority: High   Atherosclerosis of aorta 05/18/2020    Priority: Medium    Nephrolithiasis 03/24/2016    Priority: Medium    Hyperlipidemia associated with type 2 diabetes mellitus 04/22/2007    Priority: Medium    Essential hypertension 09/25/2006    Priority: Medium    Long term (current) use of anticoagulants 07/08/2017    Priority: Low   Erectile dysfunction 01/19/2014    Priority: Low   Basal cell cancer 09/29/2012    Priority: Low   Solitary pulmonary nodule 07/12/2012    Priority: Low   BPH without obstruction/lower urinary tract symptoms 10/22/2007    Priority: Low   Osteoarthritis 09/25/2006    Priority: Low   Hematuria 09/25/2006    Priority: Low   Polyp of ascending colon 01/16/2022   Acute GI bleeding 01/14/2022   Chronic combined systolic and diastolic congestive heart failure 01/13/2022   PVC's (premature ventricular contractions) 01/24/2020   Hematochezia     Medications- reviewed and updated Current Outpatient Medications  Medication Sig Dispense Refill   acetaminophen (TYLENOL) 500 MG tablet Take 500 mg by mouth at bedtime.      aspirin 81 MG tablet Take 1  tablet (81 mg total) by mouth every evening.     Blood Glucose Monitoring Suppl (ONE TOUCH ULTRA 2) w/Device KIT Use to test blood sugars daily. Dx: E11.9 1 kit 3   carvedilol (COREG) 3.125 MG tablet Take 3.125 mg by mouth 2 (two) times daily with a meal.     Cholecalciferol (VITAMIN D3) 2000 UNITS TABS Take 2,000 Units by mouth daily.     folic acid (FOLVITE) 400 MCG tablet Take 400 mcg by mouth daily.     furosemide (LASIX) 40 MG tablet Take 1 tablet (40 mg total) by mouth daily as needed. (Patient taking differently: Take 40 mg by mouth daily as needed for fluid.) 30 tablet 3   Glucosamine HCl 1500 MG TABS Take 1,500 mg by mouth every evening.      glucose blood test strip Use to test blood sugars daily. Dx: E11.9 100 each 12   Lancets (ONETOUCH DELICA PLUS LANCET30G) MISC Use to test blood sugars daily. Dx: E11.9 100 each 3   losartan (COZAAR) 50 MG tablet TAKE 1 TABLET BY MOUTH DAILY. GENERIC EQUIVALENT FOR COZAAR 90 tablet 3   Melatonin 5 MG CHEW Chew 1 tablet by mouth daily as needed.     metFORMIN (GLUCOPHAGE-XR) 500 MG 24 hr tablet TAKE 1 TABLET(500 MG) BY MOUTH DAILY WITH BREAKFAST 90 tablet 3   Multiple Vitamin (MULTIVITAMIN WITH MINERALS) TABS Take 1 tablet by mouth daily.  OneTouch Delica Lancets 30G MISC USE TO TEST BLOOD SUGAR DAILY 100 each 3   Rectal Protectant-Emollient (CALMOL-4) 76-10 % SUPP Use as directed once to twice daily 6 suppository 0   simvastatin (ZOCOR) 40 MG tablet Take 0.5 tablets (20 mg total) by mouth at bedtime. 45 tablet 3   spironolactone (ALDACTONE) 25 MG tablet Take 0.5 tablets (12.5 mg total) by mouth daily. 90 tablet 3   tamsulosin (FLOMAX) 0.4 MG CAPS capsule Take 0.4 mg by mouth at bedtime.     tiZANidine (ZANAFLEX) 2 MG tablet TAKE 1 TABLET(2 MG) BY MOUTH DAILY AS NEEDED FOR MUSCLE SPASMS. DO NOT DRIVE FOR 8 HOURS AFTER USE (Patient taking differently: Take 2 mg by mouth every 6 (six) hours as needed for muscle spasms.) 90 tablet 0   triamcinolone  cream (KENALOG) 0.1 % Apply 1 Application topically daily as needed (Foot rash).     vitamin B-12 (CYANOCOBALAMIN) 100 MCG tablet Take 100 mcg by mouth daily.     warfarin (COUMADIN) 5 MG tablet TAKE 1 TABLET BY MOUTH ON MONDAY, WEDNESDAY, AND FRIDAY, AND TAKE ONE-HALF TABLET ALL OTHER DAYS OR AS DIRECTED 100 tablet 1   nitroGLYCERIN (NITROSTAT) 0.4 MG SL tablet Place 1 tablet (0.4 mg total) under the tongue every 5 (five) minutes as needed for chest pain. 1 tablet every 5 minutes for 3 dose only 90 tablet 3   pantoprazole (PROTONIX) 40 MG tablet Take 1 tablet (40 mg total) by mouth daily. 30 tablet 0   No current facility-administered medications for this visit.     Objective:  BP 117/76   Pulse 77   Temp 98.7 F (37.1 C) (Temporal)   Resp 16   Ht  (1.803 m)   Wt 223 lb 3.2 oz (101.2 kg)   SpO2 95%   BMI 31.13 kg/m  Gen: NAD, resting comfortably CV: RRR no murmurs rubs or gallops Lungs: CTAB no crackles, wheeze, rhonchi Ext: trace edema Skin: warm, dry    Assessment and Plan   #social update- still trying to care for his wife (she had accused him of cheating, poisining her, etch-  but he is/has not). This is very challenging and she switches stories- yet still wants his help.   # Insomnia S: Patient with difficulty falling and staying asleep.  Reports this started about 2 weeks ago (mind racing).  Already takes  tizanidine sometimes for his back at bedtime and will sleep well - tylenol pm advised against- reports he is not taking this - out of melatonin but has helped in past -Falls asleep in daytime- advised against.  A/P: can retry melatonin low dose 1-3 mg and also advised no daytime naps. Only do melatonin up to 2 weeks to 'reset sleep clock" then come back off   - could consider trazodone but affects coumadin, could consider doxepin as well  #Heart failure with reduced ejection fraction/ ischemic cardiomypopathy- Dr. Gala Romney S: Medication:Carvedilol 3.125 mg  twice daily, Lasix 40 mg as needed- twice this month), losartan 50 mg daily, spironolactone 12.5 mg daily  Edema: none in legs but some in abdomen after eating Weight gain: stable Shortness of breath: not as bad as it used to be- slightly better A/P: heart failure stable- continue current medicines    #CAD with defibrillator in place/hyperlipidemia #statin myalgia on higher doser #Apical thrombus-on Coumadin through cardiology clinic S: Medication:Aspirin 81 mg as well as coumadin,  simvastatin 20 mg (half of 40) Lab Results  Component Value Date  CHOL 146 09/09/2021   HDL 47.20 09/09/2021   LDLCALC 71 09/09/2021   LDLDIRECT 79.0 03/11/2021   TRIG 136.0 09/09/2021   CHOLHDL 3 09/09/2021   A/P: CAD asymptomatic continue current medications Apical thrombus - continue coumadin Lipids- slighlty above goal but statin myalgia history- continue to monitor   #hypertension S: medication: Carvedilol 3.125 mg twice daily,  losartan 50 mg, spironolactone 12.5 mg.  Lasix 40 mg as needed Home readings #s: as low as 90 at home in past and feels slightly weak- will let us know if persistent issues A/P: stable- continue current medicines   # Diabetes S: Medication:metformin 500 mg XR CBGs- Home readings average in am around 125 Exercise and diet- twice a week or 3x a week exercise, trying to eat healthy Lab Results  Component Value Date   HGBA1C 5.9 (A) 06/02/2022   HGBA1C 6.2 01/30/2022   HGBA1C 6.4 09/09/2021  A/P: controlled/table- updated a1c with poc. Continue current meds for now   #no evidence of recent GI  bleeds- will check cbc  Recommended follow up: Return in about 4 months (around 10/02/2022) for physical or sooner if needed.Schedule b4 you leave. Future Appointments  Date Time Provider Department Center  06/25/2022 10:30 AM [facility]IN [facility]  08/01/2022  3:35 PM [facility] DEVICE REMOTES [facility]  09/10/2022  2:30 PM Marinus Maw, MD  [facility]  09/12/2022 11:00 AM [facility] ANNUAL WELLNESS VISIT 1 [facility]  10/24/2022 11:15 AM Earlene Plater, Christian L, RPH [facility] None  10/31/2022  3:35 PM [facility] DEVICE REMOTES [facility]  01/30/2023  3:35 PM [facility] DEVICE REMOTES [facility]  05/01/2023  3:35 PM [facility] DEVICE REMOTES [facility]  07/31/2023  3:35 PM [facility] DEVICE REMOTES [facility]    Lab/Order associations:   ICD-10-CM   1. DM (diabetes mellitus) type II, controlled, with peripheral vascular disorder  E11.51 POCT HgB A1C    CBC with Differential/Platelet    Comprehensive metabolic panel    Direct LDL    CANCELED: Hemoglobin A1c    2. Drug-induced myopathy  G72.0     3. Chronic systolic heart failure Chronic I50.22     4. CAD in native artery  I25.10     5. Apical mural thrombus  I51.3       No orders of the defined types were placed in this encounter.   Return precautions advised.  Tana Conch, MD

## 2022-06-02 NOTE — Patient Instructions (Addendum)
Schedule a lab visit at the check out desk within 2 weeks. Return for future fasting labs meaning nothing but water after midnight please. Ok to take your medications with water.   can retry melatonin low dose 1-3 mg and also advised no daytime naps. Only do melatonin up to 2 weeks to 'reset sleep clock" then come back off   Recommended follow up: Return in about 4 months (around 10/02/2022) for physical or sooner if needed.Schedule b4 you leave.

## 2022-06-03 NOTE — Progress Notes (Signed)
Remote ICD transmission.   

## 2022-06-06 ENCOUNTER — Other Ambulatory Visit (INDEPENDENT_AMBULATORY_CARE_PROVIDER_SITE_OTHER): Payer: Medicare Other

## 2022-06-06 ENCOUNTER — Telehealth: Payer: Self-pay | Admitting: Family Medicine

## 2022-06-06 DIAGNOSIS — E1151 Type 2 diabetes mellitus with diabetic peripheral angiopathy without gangrene: Secondary | ICD-10-CM | POA: Diagnosis not present

## 2022-06-06 LAB — CBC WITH DIFFERENTIAL/PLATELET
Basophils Absolute: 0.1 10*3/uL (ref 0.0–0.1)
Basophils Relative: 0.7 % (ref 0.0–3.0)
Eosinophils Absolute: 0.3 10*3/uL (ref 0.0–0.7)
Eosinophils Relative: 3.1 % (ref 0.0–5.0)
HCT: 43.7 % (ref 39.0–52.0)
Hemoglobin: 14.7 g/dL (ref 13.0–17.0)
Lymphocytes Relative: 37.6 % (ref 12.0–46.0)
Lymphs Abs: 3.2 10*3/uL (ref 0.7–4.0)
MCHC: 33.7 g/dL (ref 30.0–36.0)
MCV: 93 fl (ref 78.0–100.0)
Monocytes Absolute: 0.6 10*3/uL (ref 0.1–1.0)
Monocytes Relative: 7.3 % (ref 3.0–12.0)
Neutro Abs: 4.4 10*3/uL (ref 1.4–7.7)
Neutrophils Relative %: 51.3 % (ref 43.0–77.0)
Platelets: 173 10*3/uL (ref 150.0–400.0)
RBC: 4.7 Mil/uL (ref 4.22–5.81)
RDW: 13.4 % (ref 11.5–15.5)
WBC: 8.6 10*3/uL (ref 4.0–10.5)

## 2022-06-06 LAB — COMPREHENSIVE METABOLIC PANEL
ALT: 15 U/L (ref 0–53)
AST: 17 U/L (ref 0–37)
Albumin: 4.1 g/dL (ref 3.5–5.2)
Alkaline Phosphatase: 54 U/L (ref 39–117)
BUN: 28 mg/dL — ABNORMAL HIGH (ref 6–23)
CO2: 28 mEq/L (ref 19–32)
Calcium: 9.6 mg/dL (ref 8.4–10.5)
Chloride: 106 mEq/L (ref 96–112)
Creatinine, Ser: 1.31 mg/dL (ref 0.40–1.50)
GFR: 47.99 mL/min — ABNORMAL LOW (ref >60.00)
Glucose, Bld: 107 mg/dL — ABNORMAL HIGH (ref 70–99)
Potassium: 4.3 mEq/L (ref 3.5–5.1)
Sodium: 140 mEq/L (ref 135–145)
Total Bilirubin: 0.5 mg/dL (ref 0.2–1.2)
Total Protein: 6.9 g/dL (ref 6.0–8.3)

## 2022-06-06 LAB — LDL CHOLESTEROL, DIRECT: Direct LDL: 75 mg/dL

## 2022-06-06 NOTE — Telephone Encounter (Signed)
Patient dropped off document Handicap Placard, to be filled out by provider. Patient requested to send it via Call Patient to pick up within 7-days. Document is located in providers tray at front office.Please advise at Mobile [phone] (mobile)

## 2022-06-09 NOTE — Telephone Encounter (Signed)
Form completed and placed up front for pick up, called and made pt aware.

## 2022-06-13 ENCOUNTER — Telehealth: Payer: Self-pay | Admitting: Family Medicine

## 2022-06-13 NOTE — Telephone Encounter (Signed)
Pt returned call and labs reviewed.  

## 2022-06-13 NOTE — Telephone Encounter (Signed)
Pt would like a call back with lab results 

## 2022-06-23 DIAGNOSIS — H5203 Hypermetropia, bilateral: Secondary | ICD-10-CM | POA: Diagnosis not present

## 2022-06-23 LAB — HM DIABETES EYE EXAM

## 2022-06-24 ENCOUNTER — Other Ambulatory Visit: Payer: Self-pay

## 2022-06-24 MED ORDER — GLUCOSE BLOOD VI STRP: ORAL_STRIP | 12 refills | Status: DC

## 2022-06-24 MED ORDER — ONETOUCH DELICA PLUS LANCET30G MISC: 3 refills | Status: DC

## 2022-06-25 ENCOUNTER — Ambulatory Visit (INDEPENDENT_AMBULATORY_CARE_PROVIDER_SITE_OTHER): Payer: Medicare Other

## 2022-06-25 DIAGNOSIS — Z7901 Long term (current) use of anticoagulants: Secondary | ICD-10-CM

## 2022-06-25 LAB — POCT INR: INR: 2.2 (ref 2.0–3.0)

## 2022-06-25 NOTE — Progress Notes (Signed)
Continue 1 tablet daily except 1/2 tablet on Mondays and Thursdays.  Recheck in 6 weeks. 

## 2022-06-25 NOTE — Patient Instructions (Addendum)
Pre visit review using our clinic review tool, if applicable. No additional management support is needed unless otherwise documented below in the visit note.  Continue 1 tablet daily except 1/2 tablet on Mondays and Thursdays.  Recheck in 6 weeks. 

## 2022-06-25 NOTE — Progress Notes (Signed)
I have reviewed and agree with note, evaluation, plan.   Brenee Gajda, MD  

## 2022-07-05 ENCOUNTER — Encounter (HOSPITAL_COMMUNITY): Payer: Self-pay | Admitting: Emergency Medicine

## 2022-07-05 ENCOUNTER — Other Ambulatory Visit: Payer: Self-pay

## 2022-07-05 ENCOUNTER — Emergency Department (HOSPITAL_COMMUNITY): Payer: Medicare Other

## 2022-07-05 ENCOUNTER — Emergency Department (HOSPITAL_COMMUNITY)
Admission: EM | Admit: 2022-07-05 | Discharge: 2022-07-05 | Disposition: A | Discharge status: Home / Self Care | Payer: Medicare Other | Attending: Emergency Medicine | Admitting: Emergency Medicine

## 2022-07-05 DIAGNOSIS — R079 Chest pain, unspecified: Secondary | ICD-10-CM | POA: Diagnosis not present

## 2022-07-05 DIAGNOSIS — Z7982 Long term (current) use of aspirin: Secondary | ICD-10-CM | POA: Insufficient documentation

## 2022-07-05 DIAGNOSIS — I110 Hypertensive heart disease with heart failure: Secondary | ICD-10-CM | POA: Diagnosis not present

## 2022-07-05 DIAGNOSIS — I2510 Atherosclerotic heart disease of native coronary artery without angina pectoris: Secondary | ICD-10-CM | POA: Insufficient documentation

## 2022-07-05 DIAGNOSIS — R072 Precordial pain: Secondary | ICD-10-CM | POA: Diagnosis not present

## 2022-07-05 DIAGNOSIS — R0789 Other chest pain: Secondary | ICD-10-CM | POA: Diagnosis not present

## 2022-07-05 DIAGNOSIS — Z7984 Long term (current) use of oral hypoglycemic drugs: Secondary | ICD-10-CM | POA: Diagnosis not present

## 2022-07-05 DIAGNOSIS — R791 Abnormal coagulation profile: Secondary | ICD-10-CM | POA: Insufficient documentation

## 2022-07-05 DIAGNOSIS — E119 Type 2 diabetes mellitus without complications: Secondary | ICD-10-CM | POA: Diagnosis not present

## 2022-07-05 DIAGNOSIS — I509 Heart failure, unspecified: Secondary | ICD-10-CM | POA: Diagnosis not present

## 2022-07-05 DIAGNOSIS — Z7901 Long term (current) use of anticoagulants: Secondary | ICD-10-CM | POA: Insufficient documentation

## 2022-07-05 LAB — BASIC METABOLIC PANEL
Anion gap: 11 (ref 5–15)
BUN: 23 mg/dL (ref 8–23)
CO2: 23 mmol/L (ref 22–32)
Calcium: 10.3 mg/dL (ref 8.9–10.3)
Chloride: 102 mmol/L (ref 98–111)
Creatinine, Ser: 1.28 mg/dL — ABNORMAL HIGH (ref 0.61–1.24)
GFR, Estimated: 53 mL/min — ABNORMAL LOW (ref >60)
Glucose, Bld: 182 mg/dL — ABNORMAL HIGH (ref 70–99)
Potassium: 3.8 mmol/L (ref 3.5–5.1)
Sodium: 136 mmol/L (ref 135–145)

## 2022-07-05 LAB — CBC WITH DIFFERENTIAL/PLATELET
Abs Immature Granulocytes: 0.03 10*3/uL (ref 0.00–0.07)
Basophils Absolute: 0.1 10*3/uL (ref 0.0–0.1)
Basophils Relative: 1 %
Eosinophils Absolute: 0.1 10*3/uL (ref 0.0–0.5)
Eosinophils Relative: 1 %
HCT: 42.5 % (ref 39.0–52.0)
Hemoglobin: 14.6 g/dL (ref 13.0–17.0)
Immature Granulocytes: 0 %
Lymphocytes Relative: 20 %
Lymphs Abs: 2.1 10*3/uL (ref 0.7–4.0)
MCH: 31.8 pg (ref 26.0–34.0)
MCHC: 34.4 g/dL (ref 30.0–36.0)
MCV: 92.6 fL (ref 80.0–100.0)
Monocytes Absolute: 0.5 10*3/uL (ref 0.1–1.0)
Monocytes Relative: 5 %
Neutro Abs: 7.6 10*3/uL (ref 1.7–7.7)
Neutrophils Relative %: 73 %
Platelets: 157 10*3/uL (ref 150–400)
RBC: 4.59 MIL/uL (ref 4.22–5.81)
RDW: 12.4 % (ref 11.5–15.5)
WBC: 10.4 10*3/uL (ref 4.0–10.5)
nRBC: 0 % (ref 0.0–0.2)

## 2022-07-05 LAB — HEPATIC FUNCTION PANEL
ALT: 20 U/L (ref 0–44)
AST: 22 U/L (ref 15–41)
Albumin: 3.6 g/dL (ref 3.5–5.0)
Alkaline Phosphatase: 52 U/L (ref 38–126)
Bilirubin, Direct: <0.1 mg/dL (ref 0.0–0.2)
Total Bilirubin: 0.5 mg/dL (ref 0.3–1.2)
Total Protein: 6.9 g/dL (ref 6.5–8.1)

## 2022-07-05 LAB — TROPONIN I (HIGH SENSITIVITY)
Troponin I (High Sensitivity): 17 ng/L (ref <18)
Troponin I (High Sensitivity): 18 ng/L — ABNORMAL HIGH (ref <18)

## 2022-07-05 LAB — PROTIME-INR
INR: 2.3 — ABNORMAL HIGH (ref 0.8–1.2)
Prothrombin Time: 25.6 seconds — ABNORMAL HIGH (ref 11.4–15.2)

## 2022-07-05 LAB — MAGNESIUM: Magnesium: 1.8 mg/dL (ref 1.7–2.4)

## 2022-07-05 LAB — LIPASE, BLOOD: Lipase: 33 U/L (ref 11–51)

## 2022-07-05 LAB — CBG MONITORING, ED: Glucose-Capillary: 138 mg/dL — ABNORMAL HIGH (ref 70–99)

## 2022-07-05 MED ORDER — NITROGLYCERIN 2 % TD OINT
1.0000 [in_us] | TOPICAL_OINTMENT | Freq: Once | TRANSDERMAL | Status: AC
  Administered 2022-07-05: 1 [in_us] via TOPICAL
  Filled 2022-07-05: qty 1

## 2022-07-05 MED ORDER — ASPIRIN 81 MG PO CHEW
324.0000 mg | CHEWABLE_TABLET | Freq: Once | ORAL | Status: AC
  Administered 2022-07-05: 324 mg via ORAL
  Filled 2022-07-05: qty 4

## 2022-07-05 NOTE — ED Triage Notes (Signed)
Pt reports chest pain that started at 0330 this morning. Pt reports taking 1 nitro with relief. Pt stating at this time his pain is 2/10.

## 2022-07-05 NOTE — Consult Note (Signed)
Cardiology Consultation   Patient ID: Joseph Malone MRN: [id number]; DOB: December 02, 1930  Admit date: 07/05/2022 Date of Consult: 07/05/2022  PCP:  Shelva Majestic, MD   [facility] Providers Cardiologist:  None  Electrophysiologist:  Lewayne Bunting, MD  Advanced Heart Failure:  Arvilla Meres, MD  {    Patient Profile:   Joseph Malone is a 89 y.o. male with a hx of coronary artery disease status post coronary bypass and graft, ischemic cardiomyopathy, chronic combined systolic/diastolic congestive heart failure, prior ICD, hypertension, hyperlipidemia who is being seen 07/05/2022 for the evaluation of chest pain at the request of Gloris Manchester, MD.  History of Present Illness:   Patient is followed in the heart failure clinic.  He is status post myocardial infarction in 2001; had coronary artery bypass and graft with a LIMA to the LAD at that time.  Last cardiac catheterization May 2019 showed occluded LAD with patent LIMA to the distal LAD; remaining coronary disease nonobstructive.  Also with history of ischemic cardiomyopathy.  Last echocardiogram October 2023 showed ejection fraction 30 to 35%, mild left ventricular hypertrophy, grade 1 diastolic dysfunction, akinesis of the mid anteroseptal, anterior and apical walls; mild mitral regurgitation..  Patient with history of left ventricular thrombus.  Patient has had difficulties with intermittent chest pain in the past by his report.  He attributes this to indigestion.  He exercises routinely on the treadmill and riding the stationary bike.  He denies dyspnea on exertion, orthopnea, PND, pedal edema, exertional chest pain.  He awoke at 3 AM with chest discomfort in the substernal area radiating to his abdomen.  The pain was not pleuritic or positional though he did state he had had strawberries earlier and felt this may have been the cause.  He took Tums with some improvement but it did not completely resolve and he therefore  presented to the emergency room.  Total duration of pain was 4 hours.  No associated nausea, diaphoresis or dyspnea.  He states the symptoms are similar to his indigestion in the past.  Cardiology now asked to evaluate.   Past Medical History:  Diagnosis Date   Anxiety    BPH (benign prostatic hypertrophy)    CAD (coronary artery disease)    s/p CABG   CHF (congestive heart failure) (HCC)    Diabetes 1.5, managed as type 2 (HCC)    Diverticulosis    Diverticulosis of colon without hemorrhage 10/22/2007   Qualifier: Diagnosis of  By: Alwyn Ren MD, Chrissie Noa     DOE (dyspnea on exertion)    Excess weight    Fasting hyperglycemia    Generalized weakness    GERD (gastroesophageal reflux disease)    Heart attack (HCC) 2001   Heart failure    CHF due to ischemic CM   History of kidney stones    Hyperlipidemia    Hypertension    Ischemic cardiomyopathy    Microscopic hematuria    [facility]   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    Past Surgical History:  Procedure Laterality Date   APPENDECTOMY     BIOPSY  10/21/2018   Procedure: BIOPSY;  Surgeon: Benancio Deeds, MD;  Location: [facility];  Service: Gastroenterology;;   CARDIAC CATHETERIZATION  2000   CARDIAC DEFIBRILLATOR PLACEMENT  2005   [employer]; s/p ICD gen change and RV lead revision April 2015   CATARACT EXTRACTION  2008   bilateral with lens implant   COLONOSCOPY  2004  Tics; [facility]   COLONOSCOPY WITH PROPOFOL N/A 01/16/2022   Procedure: COLONOSCOPY WITH PROPOFOL;  Surgeon: Napoleon Form, MD;  Location: [facility];  Service: Gastroenterology;  Laterality: N/A;   CORONARY ARTERY BYPASS GRAFT  2000   1 vessel   CYSTOSCOPY  06/2012   Dr Margarita Grizzle   ESOPHAGOGASTRODUODENOSCOPY (EGD) WITH PROPOFOL N/A 10/21/2018   Procedure: ESOPHAGOGASTRODUODENOSCOPY (EGD) WITH PROPOFOL;  Surgeon: Benancio Deeds, MD;  Location: [facility];  Service: Gastroenterology;  Laterality: N/A;   HOT HEMOSTASIS  N/A 10/21/2018   Procedure: HOT HEMOSTASIS (ARGON PLASMA COAGULATION/BICAP);  Surgeon: Benancio Deeds, MD;  Location: [facility];  Service: Gastroenterology;  Laterality: N/A;   IMPLANTABLE CARDIOVERTER DEFIBRILLATOR (ICD) GENERATOR CHANGE N/A 06/01/2013   Procedure: ICD GENERATOR CHANGE;  Surgeon: Marinus Maw, MD;  Location: [facility] CATH LAB;  Service: Cardiovascular;  Laterality: N/A;   LEAD REVISION N/A 06/01/2013   Procedure: LEAD REVISION;  Surgeon: Marinus Maw, MD;  Location[facility] CATH LAB;  Service: Cardiovascular;  Laterality: N/A;   PILONIDAL CYST EXCISION     POLYPECTOMY  01/16/2022   Procedure: POLYPECTOMY;  Surgeon: Napoleon Form, MD;  Location: [facility];  Service: Gastroenterology;;   RIGHT/LEFT HEART CATH AND CORONARY/GRAFT ANGIOGRAPHY N/A 06/12/2017   Procedure: RIGHT/LEFT HEART CATH AND CORONARY/GRAFT ANGIOGRAPHY;  Surgeon: Dolores Patty, MD;  Location: [facility] INVASIVE CV LAB;  Service: Cardiovascular;  Laterality: N/A;   TRANSTHORACIC ECHOCARDIOGRAM  08/29/2005     Inpatient Medications: Scheduled Meds:  Continuous Infusions:  PRN Meds:   Allergies:    Allergies  Allergen Reactions   Penicillins Rash and Other (See Comments)    Because of a history of documented adverse serious drug reaction;Medi Alert bracelet  is recommended Has patient had a PCN reaction causing immediate rash, facial/tongue/throat swelling, SOB or lightheadedness with hypotension: No Has patient had a PCN reaction causing severe rash involving mucus membranes or skin necrosis: No Has patient had a PCN reaction that required hospitalization: No Has patient had a PCN reaction occurring within the last 10 years: No If all of the above answers are "NO", t    Social History:   Social History   Socioeconomic History   Marital status: Married    Spouse name: Not on file   Number of children: 4   Years of education: Not on file   Highest education level: Not on file  Occupational  History   Occupation: Retired  Tobacco Use   Smoking status: Never    Passive exposure: Yes   Smokeless tobacco: Never  Vaping Use   Vaping Use: Never used  Substance and Sexual Activity   Alcohol use: No   Drug use: No   Sexual activity: Never  Other Topics Concern   Not on file  Social History Narrative   Married 1957. 4 children 2 boys 2 girls. 15 grandkids.  4 greatgrandkids.       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       Hobbies: genealogy, travel      No HCPOA-advised to do this.    Social Determinants of Health   Financial Resource Strain: Low Risk  (09/06/2021)   Overall Financial Resource Strain (CARDIA)    Difficulty of Paying Living Expenses: Not hard at all  Food Insecurity: No Food Insecurity (01/20/2022)   Hunger Vital Sign    Worried About Running Out of Food in the Last Year: Never true    Ran Out  of Food in the Last Year: Never true  Transportation Needs: No Transportation Needs (01/20/2022)   PRAPARE - Administrator, Civil Service (Medical): No    Lack of Transportation (Non-Medical): No  Physical Activity: Sufficiently Active (09/06/2021)   Exercise Vital Sign    Days of Exercise per Week: 3 days    Minutes of Exercise per Session: 50 min  Stress: No Stress Concern Present (09/06/2021)   [employer] of Occupational Health - Occupational Stress Questionnaire    Feeling of Stress : Not at all  Social Connections: Socially Integrated (09/06/2021)   Social Connection and Isolation Panel [NHANES]    Frequency of Communication with Friends and Family: More than three times a week    Frequency of Social Gatherings with Friends and Family: More than three times a week    Attends Religious Services: More than 4 times per year    Active Member of [employer] or Organizations: Yes    Attends Banker Meetings: 1 to 4 times per year    Marital Status: Married  Catering manager Violence: Not  At Risk (01/14/2022)   Humiliation, Afraid, Rape, and Kick questionnaire    Fear of Current or Ex-Partner: No    Emotionally Abused: No    Physically Abused: No    Sexually Abused: No    Family History:    Family History  Problem Relation Age of Onset   Hypertension Father    Heart attack Father 80   Diabetes Brother        X 2   Nephrolithiasis Brother    Heart attack Brother 5   Heart attack Paternal Uncle 65   Sudden death Paternal Grandfather 95       ? heat stroke   Lung cancer Sister        "Asian lung cancer"   Stroke Neg Hx    Colon cancer Neg Hx    Esophageal cancer Neg Hx    Rectal cancer Neg Hx    Stomach cancer Neg Hx      ROS:  Please see the history of present illness.  No fevers, productive cough or hemoptysis. All other ROS reviewed and negative.     Physical Exam/Data:   Vitals:   07/05/22 0751 07/05/22 0915 07/05/22 1115 07/05/22 1200  BP: (!) 146/95 134/89 132/88 (!) 149/90  Pulse: 74 77 62 76  Resp: 17 (!) 22 16 20   Temp: 98.1 F (36.7 C)     TempSrc: Oral     SpO2: 97% 96% 95% 97%   No intake or output data in the 24 hours ending 07/05/22 1228    06/02/2022    2:55 PM 02/17/2022   10:28 AM 02/11/2022    9:35 AM  Last 3 Weights  Weight (lbs) 223 lb 3.2 oz 224 lb 222 lb  Weight (kg) 101.243 kg 101.606 kg 100.699 kg     There is no height or weight on file to calculate BMI.  General:  Well nourished, well developed, in no acute distress HEENT: normal Neck: no JVD Vascular: No carotid bruits; Distal pulses 2+ bilaterally Cardiac:  normal S1, S2; RRR; no murmur  Lungs:  clear to auscultation bilaterally, no wheezing, rhonchi or rales  Abd: soft, nontender, no hepatomegaly  Ext: no edema Musculoskeletal:  No deformities, BUE and BLE strength normal and equal Skin: warm and dry  Neuro:  CNs 2-12 intact, no focal abnormalities noted Psych:  Normal affect   EKG:  The EKG  was personally reviewed and demonstrates: Normal sinus rhythm with  occasional PVC, septal infarct, no significant ST changes.   Laboratory Data:  High Sensitivity Troponin:   Recent Labs  Lab 07/05/22 0759 07/05/22 1050  TROPONINIHS 17 18*     Chemistry Recent Labs  Lab 07/05/22 0759  NA 136  K 3.8  CL 102  CO2 23  GLUCOSE 182*  BUN 23  CREATININE 1.28*  CALCIUM 10.3  MG 1.8  GFRNONAA 53*  ANIONGAP 11    Recent Labs  Lab 07/05/22 0759  PROT 6.9  ALBUMIN 3.6  AST 22  ALT 20  ALKPHOS 52  BILITOT 0.5    Hematology Recent Labs  Lab 07/05/22 0759  WBC 10.4  RBC 4.59  HGB 14.6  HCT 42.5  MCV 92.6  MCH 31.8  MCHC 34.4  RDW 12.4  PLT 157     Radiology/Studies:  DG Chest Portable 1 View  Result Date: 07/05/2022 CLINICAL DATA:  Chest pain. EXAM: PORTABLE CHEST 1 VIEW COMPARISON:  Chest x-ray dated May 17, 2020. FINDINGS: Unchanged left chest wall pacemaker. Normal heart size status post CABG. Normal pulmonary vascularity. No focal consolidation, pleural effusion, or pneumothorax. No acute osseous abnormality. IMPRESSION: No active disease. Electronically Signed   By: Obie Dredge M.D.   On: 07/05/2022 08:53     Assessment and Plan:   Chest pain-symptoms are extremely atypical.  He states they are similar to his pain with previous indigestion and did radiate to his abdomen.  There are some improvement with Tums.  His electrocardiogram shows no diagnostic ST changes.  Troponins are 17 and 18 not consistent with acute coronary syndrome.  Also note he cannot exercise routinely with having no chest pain.  I do not think further ischemia evaluation is indicated.  Patient can be discharged from a cardiac standpoint and we will arrange follow-up as an outpatient. Ischemic cardiomyopathy-continue ARB, beta-blocker and spironolactone. Chronic systolic congestive heart failure-he is euvolemic on examination.  Continue diuretics at present dose.  Not on SGLT2 inhibitor due to history of UTIs. Coronary artery disease-continue  statin. History of apical thrombus-continue Coumadin. Hypertension-patient's blood pressure is elevated.  Would recommend continuing present medications and we will follow-up as an outpatient. Hyperlipidemia-continue statin.  Patient can be discharged from a cardiac standpoint as outlined.  Would continue preadmission medications.  We will arrange outpatient follow-up.  For questions or updates, please contact [facility] Please consult [email or web address] for contact info under    Signed, Olga Millers, MD  07/05/2022 12:28 PM

## 2022-07-05 NOTE — Discharge Instructions (Signed)
You should hear from the cardiology office to set up a follow-up appointment.  If you do not hear from their office, call the number below.  Return to the emergency department for any new or worsening symptoms of concern.

## 2022-07-05 NOTE — ED Provider Notes (Signed)
[place] EMERGENCY DEPARTMENT AT [facility] Provider Note   CSN: [id number] Arrival date & time: 07/05/22  4098     History  Chief Complaint  Patient presents with   Chest Pain    Joseph Malone is a 89 y.o. male.   Chest Pain Patient presents for chest pain.  Medical history includes CAD, CHF, HLD, HTN, BPH, arthritis, DM, GI bleeding.  He is prescribed warfarin.  His cardiologist is Dr. Ladona Ridgel.  He was in his normal state of health last night.  At 3:30 AM, he awoke from sleep with substernal chest pain.  At that time, it was 6/10 in severity.  He took Tums without relief.  Discomfort persisted throughout the morning.  He taken SL NTG at home and this did relieve his symptoms.  He denies any associated shortness of breath, nausea, diaphoresis.  He has had excessive belching this morning.  He came to the ED by private vehicle.  He is on warfarin.  He takes a baby aspirin at nighttime.     Home Medications Prior to Admission medications   Medication Sig Start Date End Date Taking? Authorizing Provider  acetaminophen (TYLENOL) 500 MG tablet Take 500 mg by mouth at bedtime.     [provider]  aspirin 81 MG tablet Take 1 tablet (81 mg total) by mouth every evening. 12/28/13   Bensimhon, Bevelyn Buckles, MD  Blood Glucose Monitoring Suppl (ONE TOUCH ULTRA 2) w/Device KIT Use to test blood sugars daily. Dx: E11.9 04/08/21   Shelva Majestic, MD  carvedilol (COREG) 3.125 MG tablet Take 3.125 mg by mouth 2 (two) times daily with a meal.    [provider]  Cholecalciferol (VITAMIN D3) 2000 UNITS TABS Take 2,000 Units by mouth daily.    [provider]  folic acid (FOLVITE) 400 MCG tablet Take 400 mcg by mouth daily.    [provider]  furosemide (LASIX) 40 MG tablet Take 1 tablet (40 mg total) by mouth daily as needed. Patient taking differently: Take 40 mg by mouth daily as needed for fluid. 10/11/21   Bensimhon, Bevelyn Buckles, MD  Glucosamine HCl 1500  MG TABS Take 1,500 mg by mouth every evening.     [provider]  glucose blood test strip Use to test blood sugars daily. Dx: E11.9 06/24/22   Shelva Majestic, MD  Lancets [facility] DELICA PLUS LANCET30G) MISC Use to test blood sugars daily. Dx: E11.9 06/24/22   Shelva Majestic, MD  losartan (COZAAR) 50 MG tablet TAKE 1 TABLET BY MOUTH DAILY. GENERIC EQUIVALENT FOR COZAAR 01/30/22   Shelva Majestic, MD  Melatonin 5 MG CHEW Chew 1 tablet by mouth daily as needed.    [provider]  metFORMIN (GLUCOPHAGE-XR) 500 MG 24 hr tablet TAKE 1 TABLET(500 MG) BY MOUTH DAILY WITH BREAKFAST 05/30/22   Shelva Majestic, MD  Multiple Vitamin (MULTIVITAMIN WITH MINERALS) TABS Take 1 tablet by mouth daily.     [provider]  nitroGLYCERIN (NITROSTAT) 0.4 MG SL tablet Place 1 tablet (0.4 mg total) under the tongue every 5 (five) minutes as needed for chest pain. 1 tablet every 5 minutes for 3 dose only 02/11/22 05/12/22  Milford, Anderson Malta, FNP  OneTouch Delica Lancets 30G MISC USE TO TEST BLOOD SUGAR DAILY 04/24/21   Shelva Majestic, MD  pantoprazole (PROTONIX) 40 MG tablet Take 1 tablet (40 mg total) by mouth daily. 01/18/22 02/17/22  Darlin Drop, DO  Rectal Protectant-Emollient (  CALMOL-4) 76-10 % SUPP Use as directed once to twice daily 02/17/22   Armbruster, Willaim Rayas, MD  simvastatin (ZOCOR) 40 MG tablet Take 0.5 tablets (20 mg total) by mouth at bedtime. 03/28/22   Bensimhon, Bevelyn Buckles, MD  spironolactone (ALDACTONE) 25 MG tablet Take 0.5 tablets (12.5 mg total) by mouth daily. 09/09/21   Shelva Majestic, MD  tamsulosin (FLOMAX) 0.4 MG CAPS capsule Take 0.4 mg by mouth at bedtime. 12/13/19   [provider]  tiZANidine (ZANAFLEX) 2 MG tablet TAKE 1 TABLET(2 MG) BY MOUTH DAILY AS NEEDED FOR MUSCLE SPASMS. DO NOT DRIVE FOR 8 HOURS AFTER USE Patient taking differently: Take 2 mg by mouth every 6 (six) hours as needed for muscle spasms. 06/20/21   Shelva Majestic, MD   triamcinolone cream (KENALOG) 0.1 % Apply 1 Application topically daily as needed (Foot rash). 10/03/20   [provider]  vitamin B-12 (CYANOCOBALAMIN) 100 MCG tablet Take 100 mcg by mouth daily.    [provider]  warfarin (COUMADIN) 5 MG tablet TAKE 1 TABLET BY MOUTH ON MONDAY, WEDNESDAY, AND FRIDAY, AND TAKE ONE-HALF TABLET ALL OTHER DAYS OR AS DIRECTED 05/15/22   Shelva Majestic, MD      Allergies    Penicillins    Review of Systems   Review of Systems  Cardiovascular:  Positive for chest pain.  All other systems reviewed and are negative.   Physical Exam Updated Vital Signs BP (!) 149/90   Pulse 76   Temp 98.1 F (36.7 C) (Oral)   Resp 20   SpO2 97%  Physical Exam Vitals and nursing note reviewed.  Constitutional:      General: He is not in acute distress.    Appearance: He is well-developed. He is not ill-appearing, toxic-appearing or diaphoretic.  HENT:     Head: Normocephalic and atraumatic.  Eyes:     Conjunctiva/sclera: Conjunctivae normal.  Cardiovascular:     Rate and Rhythm: Normal rate and regular rhythm.     Heart sounds: No murmur heard. Pulmonary:     Effort: Pulmonary effort is normal. No tachypnea or respiratory distress.     Breath sounds: Normal breath sounds. No decreased breath sounds, wheezing, rhonchi or rales.  Chest:     Chest wall: No tenderness.  Abdominal:     Palpations: Abdomen is soft.     Tenderness: There is no abdominal tenderness.  Musculoskeletal:        General: No swelling. Normal range of motion.     Cervical back: Normal range of motion and neck supple.     Right lower leg: No edema.     Left lower leg: No edema.  Skin:    General: Skin is warm and dry.     Coloration: Skin is not cyanotic or pale.  Neurological:     General: No focal deficit present.     Mental Status: He is alert and oriented to person, place, and time.  Psychiatric:        Mood and Affect: Mood normal.        Behavior: Behavior  normal.     ED Results / Procedures / Treatments   Labs (all labs ordered are listed, but only abnormal results are displayed) Labs Reviewed  BASIC METABOLIC PANEL - Abnormal; Notable for the following components:      Result Value   Glucose, Bld 182 (*)    Creatinine, Ser 1.28 (*)    GFR, Estimated 53 (*)  All other components within normal limits  PROTIME-INR - Abnormal; Notable for the following components:   Prothrombin Time 25.6 (*)    INR 2.3 (*)    All other components within normal limits  CBG MONITORING, ED - Abnormal; Notable for the following components:   Glucose-Capillary 138 (*)    All other components within normal limits  TROPONIN I (HIGH SENSITIVITY) - Abnormal; Notable for the following components:   Troponin I (High Sensitivity) 18 (*)    All other components within normal limits  MAGNESIUM  LIPASE, BLOOD  HEPATIC FUNCTION PANEL  CBC WITH DIFFERENTIAL/PLATELET  TROPONIN I (HIGH SENSITIVITY)    EKG EKG Interpretation  Date/Time:  Saturday Jul 05 2022 07:51:04 EDT Ventricular Rate:  71 PR Interval:  168 QRS Duration: 111 QT Interval:  353 QTC Calculation: 384 R Axis:   37 Text Interpretation: Sinus rhythm Ventricular premature complex Anteroseptal infarct, age indeterminate Confirmed by Gloris Manchester [id number]) on 07/05/2022 8:13:31 AM  Radiology DG Chest Portable 1 View  Result Date: 07/05/2022 CLINICAL DATA:  Chest pain. EXAM: PORTABLE CHEST 1 VIEW COMPARISON:  Chest x-ray dated May 17, 2020. FINDINGS: Unchanged left chest wall pacemaker. Normal heart size status post CABG. Normal pulmonary vascularity. No focal consolidation, pleural effusion, or pneumothorax. No acute osseous abnormality. IMPRESSION: No active disease. Electronically Signed   By: Obie Dredge M.D.   On: 07/05/2022 08:53    Procedures Procedures    Medications Ordered in ED Medications  aspirin chewable tablet 324 mg (324 mg Oral Given 07/05/22 0818)  nitroGLYCERIN (NITROGLYN) 2  % ointment 1 inch (1 inch Topical Given 07/05/22 0857)    ED Course/ Medical Decision Making/ A&P                             Medical Decision Making Amount and/or Complexity of Data Reviewed Labs: ordered. Radiology: ordered.  Risk OTC drugs. Prescription drug management.   This patient presents to the ED for concern of chest pain, this involves an extensive number of treatment options, and is a complaint that carries with it a high risk of complications and morbidity.  The differential diagnosis includes ACS, GERD, pneumonia, pericarditis, anxiety   Co morbidities that complicate the patient evaluation  CAD, CHF, HLD, HTN, BPH, arthritis, DM, GI bleeding   Additional history obtained:  Additional history obtained from N/A External records from outside source obtained and reviewed including EMR   Lab Tests:  I Ordered, and personally interpreted labs.  The pertinent results include: Baseline creatinine, normal electrolytes, normal hemoglobin, no leukocytosis, hydration normal troponins, slightly subtherapeutic INR   Imaging Studies ordered:  I ordered imaging studies including chest x-ray I independently visualized and interpreted imaging which showed no acute findings I agree with the radiologist interpretation   Cardiac Monitoring: / EKG:  The patient was maintained on a cardiac monitor.  I personally viewed and interpreted the cardiac monitored which showed an underlying rhythm of: Sinus rhythm   Consultations Obtained:  I requested consultation with the cardiologist, Dr. Izora Ribas,  and discussed lab and imaging findings as well as pertinent plan - they recommend: Cardiology to evaluate in the ED   Problem List / ED Course / Critical interventions / Medication management  Patient presents for acute onset of chest pain.  This started 4 hours prior to arrival.  It did wake him up from sleep.  It was improved with SL NTG.  On arrival in the ED, patient  is  well-appearing.  Current pain is 2/10 in severity.  EKG does not show any clear ST segment elevations.  324 of ASA was ordered.  NTG ointment was ordered.  Laboratory workup was initiated.  Initial lab work is reassuring.  Initial troponin is 17.  On reassessment, patient reports resolution of his chest discomfort.  Given his known history of CAD and chest pain that woke him up from sleep and was resolved with NTG, cardiology was consulted.  Cardiology to evaluate in the ED. patient was seen by Dr. Jens Som in the ED.  Second troponin showed no concerning elevation.  Cardiology recommends discharge home with outpatient follow-up.  Patient had sustained resolution of symptoms and does feel comfortable with this plan.  He was discharged in stable condition. I ordered medication including ASA and NTG for chest pain Reevaluation of the patient after these medicines showed that the patient resolved I have reviewed the patients home medicines and have made adjustments as needed   Social Determinants of Health:  Has access to outpatient care, lives independently         Final Clinical Impression(s) / ED Diagnoses Final diagnoses:  Chest pain, unspecified type    Rx / DC Orders ED Discharge Orders          Ordered    Ambulatory referral to Cardiology       Comments: If you have not heard from the Cardiology office within the next 72 hours please call [phone].   07/05/22 1446              Gloris Manchester, MD 07/05/22 1447

## 2022-07-08 ENCOUNTER — Telehealth: Payer: Self-pay | Admitting: Family Medicine

## 2022-07-08 NOTE — Telephone Encounter (Signed)
Pt was in the ED for chest pains and was told to make appt with Dr Durene Cal. He does not have any appts anytime soon. Please advise.

## 2022-07-08 NOTE — Telephone Encounter (Signed)
Someone rescheduled their appt on Thursday and I was able to put pt in that slot. Thank you.

## 2022-07-08 NOTE — Telephone Encounter (Signed)
Would you like to work pt in or ok to see another provider?

## 2022-07-10 ENCOUNTER — Ambulatory Visit: Payer: Medicare Other | Admitting: Family Medicine

## 2022-07-10 NOTE — Progress Notes (Signed)
[facility] Note   Date:  07/11/2022   ID:  Joseph Malone, DOB [birth year], MRN [id number]  Location: Home  Provider location: [facility] Type of Visit: Established patient  PCP:  Shelva Majestic, MD  HF Cardiologist: Bensimhon  Chief Complaint: Heart Failure follow-up   HPI: Joseph Malone is a pleasant 89 y.o. male with a history of coronary artery disease status post large anterior MI with coronary  bypass grafting in 2001 with a LIMA to the LAD.  He has history of  congestive heart failure secondary to resultant ischemic cardiomyopathy, ejection fraction however is in the 35%.  He is status post  single-chamber ICD.    The rest of his medical history is notable for obesity, hypertension,  hyperlipidemia, and glucose intolerance.  Cath in 5/19 with stable CAD with patent LIMA to LAD.  Echo (10/23): EF stable 30-35%  Admitted 12/23 with GIB. INR 2.4 on admit, FOBT + and warfarin held. GI consulted, underwent colonoscopy showing 1 polyp removed. Had diverticulosis in the sigmoid colon and descending colon. There was a nonbleeding internal hemorrhoid. No clear evidence of recent GI bleed. Warfarin resumed. Hospitalization c/b by AKI, losartan and spiro held, only spiro restarted at discharge.   Presented to the ED 5/24 with chest pain. Woke up from sleep with 6/10 chest pain. Tums didn't help, SLNTG helped relieve his symptoms. Cardiology consulted. HsTrop 17>18. EKG no St changes. Discharged same day with resolved symptoms.   Today he returns for AHF follow up with his son. Overall feeling not too good. Main complaint is back pain. Denies palpitations, edema, or PND/Orthopnea. No CP since ED visit. SOB walking up a hill, baseline for him. Appetite good. No fever or chills. Weight at home 217-220 pounds. Had a fall "slipped out of the bed" and another standing from a chair recently. Taking all medications. Does not smoke or drink. Has been working  out at [employer] 2x/week. Last went about 2 weeks ago with recent dizziness. Has been feeling dizzy and nauseous for the past week. SBP 80s-100s at home. Takes PRN lasix maybe twice a month.   Cardiac Studies: - Echo (10/23): EF 30-35% - R/LHC 06/12/2017   Ao = 143/77 (105) LV 145/8 RA = 3 RV = 26/4 PA = 27/5 (12) PCW = 9 Fick cardiac output/index = 4.4/2.0 PVR = 0.7 WU FA sat = 97% PA sat = 62%, 63%  1. Left-dominant coronary system with chronically-occluded LAD 2. Non-obstructive CAD elsewhere 3. Patent LIMA to LAD 4. LV gram not performed due to LV thrombus 5. Normal right heart pressure with mildly reduced cardiac output  - Echo 05/2017 EF 30-35% with   Past Medical History:  Diagnosis Date   Anxiety    BPH (benign prostatic hypertrophy)    CAD (coronary artery disease)    s/p CABG   CHF (congestive heart failure) (HCC)    Diabetes 1.5, managed as type 2 (HCC)    Diverticulosis    Diverticulosis of colon without hemorrhage 10/22/2007   Qualifier: Diagnosis of  By: Alwyn Ren MD, Chrissie Noa     DOE (dyspnea on exertion)    Excess weight    Fasting hyperglycemia    Generalized weakness    GERD (gastroesophageal reflux disease)    Heart attack (HCC) 2001   Heart failure    CHF due to ischemic CM   History of kidney stones    Hyperlipidemia    Hypertension    Ischemic  cardiomyopathy    Microscopic hematuria    [facility]   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   Past Surgical History:  Procedure Laterality Date   APPENDECTOMY     BIOPSY  10/21/2018   Procedure: BIOPSY;  Surgeon: Benancio Deeds, MD;  Location: [facility];  Service: Gastroenterology;;   CARDIAC CATHETERIZATION  2000   CARDIAC DEFIBRILLATOR PLACEMENT  2005   [employer]; s/p ICD gen change and RV lead revision April 2015   CATARACT EXTRACTION  2008   bilateral with lens implant   COLONOSCOPY  2004   Tics; [facility]   COLONOSCOPY WITH PROPOFOL N/A 01/16/2022   Procedure: COLONOSCOPY  WITH PROPOFOL;  Surgeon: Napoleon Form, MD;  Location: [facility];  Service: Gastroenterology;  Laterality: N/A;   CORONARY ARTERY BYPASS GRAFT  2000   1 vessel   CYSTOSCOPY  06/2012   Dr Margarita Grizzle   ESOPHAGOGASTRODUODENOSCOPY (EGD) WITH PROPOFOL N/A 10/21/2018   Procedure: ESOPHAGOGASTRODUODENOSCOPY (EGD) WITH PROPOFOL;  Surgeon: Benancio Deeds, MD;  Location: [facility];  Service: Gastroenterology;  Laterality: N/A;   HOT HEMOSTASIS N/A 10/21/2018   Procedure: HOT HEMOSTASIS (ARGON PLASMA COAGULATION/BICAP);  Surgeon: Benancio Deeds, MD;  Location: [facility];  Service: Gastroenterology;  Laterality: N/A;   IMPLANTABLE CARDIOVERTER DEFIBRILLATOR (ICD) GENERATOR CHANGE N/A 06/01/2013   Procedure: ICD GENERATOR CHANGE;  Surgeon: Marinus Maw, MD;  Location: [facility] CATH LAB;  Service: Cardiovascular;  Laterality: N/A;   LEAD REVISION N/A 06/01/2013   Procedure: LEAD REVISION;  Surgeon: Marinus Maw, MD;  Location: [facility] LAB;  Service: Cardiovascular;  Laterality: N/A;   PILONIDAL CYST EXCISION     POLYPECTOMY  01/16/2022   Procedure: POLYPECTOMY;  Surgeon: Napoleon Form, MD;  Location: [facility];  Service: Gastroenterology;;   RIGHT/LEFT HEART CATH AND CORONARY/GRAFT ANGIOGRAPHY N/A 06/12/2017   Procedure: RIGHT/LEFT HEART CATH AND CORONARY/GRAFT ANGIOGRAPHY;  Surgeon: Dolores Patty, MD;  Location: [facility]AS[facility] CV LAB;  Service: Cardiovascular;  Laterality: N/A;   TRANSTHORACIC ECHOCARDIOGRAM  08/29/2005   Current Outpatient Medications  Medication Sig Dispense Refill   acetaminophen (TYLENOL) 500 MG tablet Take 500 mg by mouth at bedtime.      aspirin 81 MG tablet Take 1 tablet (81 mg total) by mouth every evening.     Blood Glucose Monitoring Suppl (ONE TOUCH ULTRA 2) w/Device KIT Use to test blood sugars daily. Dx: E11.9 1 kit 3   Cholecalciferol (VITAMIN D3) 2000 UNITS TABS Take 2,000 Units by mouth daily.     folic acid (FOLVITE) 400 MCG tablet Take 400 mcg  by mouth daily.     furosemide (LASIX) 40 MG tablet Take 1 tablet (40 mg total) by mouth daily as needed. 30 tablet 3   Glucosamine HCl 1500 MG TABS Take 1,500 mg by mouth every evening.      glucose blood test strip Use to test blood sugars daily. Dx: E11.9 100 each 12   Lancets (ONETOUCH DELICA PLUS LANCET30G) MISC Use to test blood sugars daily. Dx: E11.9 100 each 3   losartan (COZAAR) 25 MG tablet Take 1 tablet (25 mg total) by mouth daily. 90 tablet 3   Melatonin 5 MG CHEW Chew 1 tablet by mouth daily as needed.     metFORMIN (GLUCOPHAGE-XR) 500 MG 24 hr tablet TAKE 1 TABLET(500 MG) BY MOUTH DAILY WITH BREAKFAST 90 tablet 3   Multiple Vitamin (MULTIVITAMIN WITH MINERALS) TABS Take 1 tablet by mouth daily.  nitroGLYCERIN (NITROSTAT) 0.4 MG SL tablet Place 1 tablet (0.4 mg total) under the tongue every 5 (five) minutes as needed for chest pain. 1 tablet every 5 minutes for 3 dose only 90 tablet 3   OneTouch Delica Lancets 30G MISC USE TO TEST BLOOD SUGAR DAILY 100 each 3   pantoprazole (PROTONIX) 40 MG tablet Take 1 tablet (40 mg total) by mouth daily. 30 tablet 0   Rectal Protectant-Emollient (CALMOL-4) 76-10 % SUPP Use as directed once to twice daily (Patient taking differently: Use as directed once to twice daily as needed) 6 suppository 0   simvastatin (ZOCOR) 40 MG tablet Take 0.5 tablets (20 mg total) by mouth at bedtime. 45 tablet 3   spironolactone (ALDACTONE) 25 MG tablet Take 0.5 tablets (12.5 mg total) by mouth daily. 90 tablet 3   tamsulosin (FLOMAX) 0.4 MG CAPS capsule Take 0.4 mg by mouth at bedtime.     tiZANidine (ZANAFLEX) 2 MG tablet TAKE 1 TABLET(2 MG) BY MOUTH DAILY AS NEEDED FOR MUSCLE SPASMS. DO NOT DRIVE FOR 8 HOURS AFTER USE (Patient taking differently: Take 2 mg by mouth every 6 (six) hours as needed for muscle spasms.) 90 tablet 0   vitamin B-12 (CYANOCOBALAMIN) 100 MCG tablet Take 100 mcg by mouth daily.     warfarin (COUMADIN) 5 MG tablet TAKE 1 TABLET BY MOUTH ON  MONDAY, WEDNESDAY, AND FRIDAY, AND TAKE ONE-HALF TABLET ALL OTHER DAYS OR AS DIRECTED 100 tablet 1   No current facility-administered medications for this encounter.   Allergies:   Penicillins   Social History:  The patient  reports that he has never smoked. He has been exposed to tobacco smoke. He has never used smokeless tobacco. He reports that he does not drink alcohol and does not use drugs.   Family History:  The patient's family history includes Diabetes in his brother; Heart attack (age of onset: 54) in his brother, father, and paternal uncle; Hypertension in his father; Lung cancer in his sister; Nephrolithiasis in his brother; Sudden death (age of onset: 17) in his paternal grandfather.   ROS:  Please see the history of present illness.   All other systems are personally reviewed and negative.   Recent Labs: 07/05/2022: ALT 20; BUN 23; Creatinine, Ser 1.28; Hemoglobin 14.6; Magnesium 1.8; Platelets 157; Potassium 3.8; Sodium 136  Personally reviewed   Wt Readings from Last 3 Encounters:  07/11/22 99.2 kg (218 lb 9.6 oz)  06/02/22 101.2 kg (223 lb 3.2 oz)  02/17/22 101.6 kg (224 lb)   BP 114/80   Pulse 85   Wt 99.2 kg (218 lb 9.6 oz)   SpO2 96%   BMI 30.49 kg/m   Physical Exam General:  elderly appearing.  No respiratory difficulty. Walked into clinic with cane HEENT: normal Neck: supple. JVD flat. Carotids 2+ bilat; no bruits. No lymphadenopathy or thyromegaly appreciated. Cor: PMI nondisplaced. Regular rate & rhythm. No rubs, gallops or murmurs. Lungs: clear Abdomen: soft, nontender, nondistended. No hepatosplenomegaly. No bruits or masses. Good bowel sounds. Extremities: no cyanosis, clubbing, rash, edema  Neuro: alert & oriented x 3, cranial nerves grossly intact. moves all 4 extremities w/o difficulty. Affect pleasant.   ECG (personally reviewed): NSR with sinus arrythmia, occasional PVCs 83 bpm  ICD interrogation (personally reviewed): OptiVol down, thoracic  impedance up, 1 hr/day activity, no VT or AF  ASSESSMENT AND PLAN: 1) CAD:  - s/p CABG 2001 with LIMA to LAD  - Cath 5/19 with intact revascularization and normal filling pressures.  -  presented to ED recently with CP resolved by nitro, workup negative? MSK vs GERD.  - ECG ok today. - Continue statin and ASA - Has Rx for PRN SL nitro. Has BP room for long-acting nitrate if pain continues. Would hold off on repeat LHC in absence of clear ACS and known chronically occluded LAD, with mild disease in RCA and Cx.  2) Chronic systolic HF: ICM - Echo 05/2017 EF 30-35% with apical thrombus. [employer] ICD. No shocks - Cath with 5/19 with intact revascularization and normal filling pressures  - Echo 10/23 EF unchanged at 30-35%.  - NYHA III, limited by fatigue and weakness. Volume status stable on exam and by device.  - Continue lasix 40 mg daily as needed.   - Stop coreg with increased fatigue - Continue spiro 12.5 mg daily. Will not increase with recent dizziness/lightheaded/fall - Decrease losartan 50>25 mg daily. (Recent BPs at home in 80s) - No SGLT2i with hx of UTIs  - BMET, BNP today - Update echo  4) HTN - Stable today without meds. Has been hypotensive last couple days (brought log) - GDMT changes as above   5) HLD - Managed by PCP.  - Continue statin. No change.   6) Apical Thrombus - On coumadin.  - No bleeding issues. Last INR 2.3 - follows in [place]  7) h/o GIB - colonoscopy 12/23 with 1 polyp removed, diverticulosis and non-bleeding internal hemorrhoids; no evidence of active GI bleed.  - Resolved.  Follow up with Dr. Gala Romney in 3 months. Update echo in meantime.    Alen Bleacher, NP  07/11/2022 10:48 AM  [facility] [address] [facility] and [facility] [place] [address] [phone] (office) [phone] (fax)

## 2022-07-11 ENCOUNTER — Other Ambulatory Visit: Payer: Self-pay

## 2022-07-11 ENCOUNTER — Encounter (HOSPITAL_COMMUNITY): Payer: Self-pay

## 2022-07-11 ENCOUNTER — Encounter (HOSPITAL_BASED_OUTPATIENT_CLINIC_OR_DEPARTMENT_OTHER): Payer: Self-pay | Admitting: Emergency Medicine

## 2022-07-11 ENCOUNTER — Emergency Department (HOSPITAL_BASED_OUTPATIENT_CLINIC_OR_DEPARTMENT_OTHER)
Admission: EM | Admit: 2022-07-11 | Discharge: 2022-07-11 | Disposition: A | Discharge status: Home / Self Care | Payer: Medicare Other | Attending: Emergency Medicine | Admitting: Emergency Medicine

## 2022-07-11 ENCOUNTER — Emergency Department (HOSPITAL_BASED_OUTPATIENT_CLINIC_OR_DEPARTMENT_OTHER): Payer: Medicare Other

## 2022-07-11 ENCOUNTER — Ambulatory Visit (HOSPITAL_BASED_OUTPATIENT_CLINIC_OR_DEPARTMENT_OTHER)
Admission: RE | Admit: 2022-07-11 | Discharge: 2022-07-11 | Disposition: A | Discharge status: Home / Self Care | Payer: Medicare Other | Source: Ambulatory Visit | Attending: Internal Medicine | Admitting: Internal Medicine

## 2022-07-11 VITALS — BP 114/80 | HR 85 | Wt 218.6 lb

## 2022-07-11 DIAGNOSIS — K648 Other hemorrhoids: Secondary | ICD-10-CM | POA: Diagnosis not present

## 2022-07-11 DIAGNOSIS — E785 Hyperlipidemia, unspecified: Secondary | ICD-10-CM | POA: Insufficient documentation

## 2022-07-11 DIAGNOSIS — I252 Old myocardial infarction: Secondary | ICD-10-CM | POA: Insufficient documentation

## 2022-07-11 DIAGNOSIS — E669 Obesity, unspecified: Secondary | ICD-10-CM | POA: Insufficient documentation

## 2022-07-11 DIAGNOSIS — K5730 Diverticulosis of large intestine without perforation or abscess without bleeding: Secondary | ICD-10-CM | POA: Diagnosis not present

## 2022-07-11 DIAGNOSIS — Z9049 Acquired absence of other specified parts of digestive tract: Secondary | ICD-10-CM | POA: Insufficient documentation

## 2022-07-11 DIAGNOSIS — R319 Hematuria, unspecified: Secondary | ICD-10-CM | POA: Diagnosis not present

## 2022-07-11 DIAGNOSIS — I959 Hypotension, unspecified: Secondary | ICD-10-CM | POA: Insufficient documentation

## 2022-07-11 DIAGNOSIS — I2510 Atherosclerotic heart disease of native coronary artery without angina pectoris: Secondary | ICD-10-CM

## 2022-07-11 DIAGNOSIS — I5022 Chronic systolic (congestive) heart failure: Secondary | ICD-10-CM | POA: Insufficient documentation

## 2022-07-11 DIAGNOSIS — I509 Heart failure, unspecified: Secondary | ICD-10-CM | POA: Insufficient documentation

## 2022-07-11 DIAGNOSIS — M545 Low back pain, unspecified: Secondary | ICD-10-CM | POA: Insufficient documentation

## 2022-07-11 DIAGNOSIS — Z951 Presence of aortocoronary bypass graft: Secondary | ICD-10-CM | POA: Insufficient documentation

## 2022-07-11 DIAGNOSIS — I5042 Chronic combined systolic (congestive) and diastolic (congestive) heart failure: Secondary | ICD-10-CM

## 2022-07-11 DIAGNOSIS — Z6830 Body mass index (BMI) 30.0-30.9, adult: Secondary | ICD-10-CM | POA: Insufficient documentation

## 2022-07-11 DIAGNOSIS — I255 Ischemic cardiomyopathy: Secondary | ICD-10-CM | POA: Diagnosis not present

## 2022-07-11 DIAGNOSIS — Z7901 Long term (current) use of anticoagulants: Secondary | ICD-10-CM | POA: Insufficient documentation

## 2022-07-11 DIAGNOSIS — K828 Other specified diseases of gallbladder: Secondary | ICD-10-CM | POA: Diagnosis not present

## 2022-07-11 DIAGNOSIS — R531 Weakness: Secondary | ICD-10-CM | POA: Diagnosis not present

## 2022-07-11 DIAGNOSIS — I10 Essential (primary) hypertension: Secondary | ICD-10-CM | POA: Diagnosis not present

## 2022-07-11 DIAGNOSIS — Z79899 Other long term (current) drug therapy: Secondary | ICD-10-CM | POA: Insufficient documentation

## 2022-07-11 DIAGNOSIS — R5383 Other fatigue: Secondary | ICD-10-CM | POA: Diagnosis not present

## 2022-07-11 DIAGNOSIS — N3001 Acute cystitis with hematuria: Secondary | ICD-10-CM | POA: Diagnosis not present

## 2022-07-11 DIAGNOSIS — M25552 Pain in left hip: Secondary | ICD-10-CM | POA: Diagnosis not present

## 2022-07-11 DIAGNOSIS — Z7982 Long term (current) use of aspirin: Secondary | ICD-10-CM | POA: Insufficient documentation

## 2022-07-11 DIAGNOSIS — N400 Enlarged prostate without lower urinary tract symptoms: Secondary | ICD-10-CM | POA: Diagnosis not present

## 2022-07-11 DIAGNOSIS — K811 Chronic cholecystitis: Secondary | ICD-10-CM

## 2022-07-11 DIAGNOSIS — D72829 Elevated white blood cell count, unspecified: Secondary | ICD-10-CM | POA: Diagnosis not present

## 2022-07-11 DIAGNOSIS — R9431 Abnormal electrocardiogram [ECG] [EKG]: Secondary | ICD-10-CM | POA: Diagnosis not present

## 2022-07-11 DIAGNOSIS — R109 Unspecified abdominal pain: Secondary | ICD-10-CM | POA: Diagnosis not present

## 2022-07-11 DIAGNOSIS — Z8719 Personal history of other diseases of the digestive system: Secondary | ICD-10-CM

## 2022-07-11 DIAGNOSIS — I110 Hypertensive heart disease with heart failure: Secondary | ICD-10-CM | POA: Insufficient documentation

## 2022-07-11 DIAGNOSIS — Z794 Long term (current) use of insulin: Secondary | ICD-10-CM | POA: Diagnosis not present

## 2022-07-11 DIAGNOSIS — I513 Intracardiac thrombosis, not elsewhere classified: Secondary | ICD-10-CM

## 2022-07-11 DIAGNOSIS — Z95810 Presence of automatic (implantable) cardiac defibrillator: Secondary | ICD-10-CM | POA: Insufficient documentation

## 2022-07-11 LAB — CBC WITH DIFFERENTIAL/PLATELET
Abs Immature Granulocytes: 0.12 10*3/uL — ABNORMAL HIGH (ref 0.00–0.07)
Basophils Absolute: 0.1 10*3/uL (ref 0.0–0.1)
Basophils Relative: 1 %
Eosinophils Absolute: 0.2 10*3/uL (ref 0.0–0.5)
Eosinophils Relative: 1 %
HCT: 40.9 % (ref 39.0–52.0)
Hemoglobin: 14 g/dL (ref 13.0–17.0)
Immature Granulocytes: 1 %
Lymphocytes Relative: 16 %
Lymphs Abs: 1.9 10*3/uL (ref 0.7–4.0)
MCH: 31 pg (ref 26.0–34.0)
MCHC: 34.2 g/dL (ref 30.0–36.0)
MCV: 90.7 fL (ref 80.0–100.0)
Monocytes Absolute: 0.6 10*3/uL (ref 0.1–1.0)
Monocytes Relative: 5 %
Neutro Abs: 9.4 10*3/uL — ABNORMAL HIGH (ref 1.7–7.7)
Neutrophils Relative %: 76 %
Platelets: 131 10*3/uL — ABNORMAL LOW (ref 150–400)
RBC: 4.51 MIL/uL (ref 4.22–5.81)
RDW: 12.8 % (ref 11.5–15.5)
WBC: 12.3 10*3/uL — ABNORMAL HIGH (ref 4.0–10.5)
nRBC: 0 % (ref 0.0–0.2)

## 2022-07-11 LAB — BRAIN NATRIURETIC PEPTIDE: B Natriuretic Peptide: 201.7 pg/mL — ABNORMAL HIGH (ref 0.0–100.0)

## 2022-07-11 LAB — BASIC METABOLIC PANEL
Anion gap: 11 (ref 5–15)
BUN: 57 mg/dL — ABNORMAL HIGH (ref 8–23)
CO2: 22 mmol/L (ref 22–32)
Calcium: 9 mg/dL (ref 8.9–10.3)
Chloride: 101 mmol/L (ref 98–111)
Creatinine, Ser: 1.75 mg/dL — ABNORMAL HIGH (ref 0.61–1.24)
GFR, Estimated: 37 mL/min — ABNORMAL LOW (ref >60)
Glucose, Bld: 141 mg/dL — ABNORMAL HIGH (ref 70–99)
Potassium: 3.9 mmol/L (ref 3.5–5.1)
Sodium: 134 mmol/L — ABNORMAL LOW (ref 135–145)

## 2022-07-11 LAB — TROPONIN I (HIGH SENSITIVITY)
Troponin I (High Sensitivity): 22 ng/L — ABNORMAL HIGH (ref <18)
Troponin I (High Sensitivity): 22 ng/L — ABNORMAL HIGH (ref <18)

## 2022-07-11 LAB — HEPATIC FUNCTION PANEL
ALT: 39 U/L (ref 0–44)
AST: 31 U/L (ref 15–41)
Albumin: 4.1 g/dL (ref 3.5–5.0)
Alkaline Phosphatase: 63 U/L (ref 38–126)
Bilirubin, Direct: 0.2 mg/dL (ref 0.0–0.2)
Indirect Bilirubin: 0.4 mg/dL (ref 0.3–0.9)
Total Bilirubin: 0.6 mg/dL (ref 0.3–1.2)
Total Protein: 7.4 g/dL (ref 6.5–8.1)

## 2022-07-11 LAB — URINALYSIS, ROUTINE W REFLEX MICROSCOPIC
Bilirubin Urine: NEGATIVE
Glucose, UA: NEGATIVE mg/dL
Ketones, ur: NEGATIVE mg/dL
Nitrite: POSITIVE — AB
Specific Gravity, Urine: 1.021 (ref 1.005–1.030)
WBC, UA: >50 WBC/hpf (ref 0–5)
pH: 5 (ref 5.0–8.0)

## 2022-07-11 LAB — LIPASE, BLOOD: Lipase: 40 U/L (ref 11–51)

## 2022-07-11 LAB — CBG MONITORING, ED: Glucose-Capillary: 128 mg/dL — ABNORMAL HIGH (ref 70–99)

## 2022-07-11 MED ORDER — CEPHALEXIN 500 MG PO CAPS: 500.0000 mg | ORAL_CAPSULE | Freq: Two times a day (BID) | ORAL | 0 refills | Status: AC

## 2022-07-11 MED ORDER — HYDROCODONE-ACETAMINOPHEN 5-325 MG PO TABS: 1.0000 | ORAL_TABLET | Freq: Four times a day (QID) | ORAL | 0 refills | Status: DC | PRN

## 2022-07-11 MED ORDER — HYDROCODONE-ACETAMINOPHEN 5-325 MG PO TABS
1.0000 | ORAL_TABLET | Freq: Once | ORAL | Status: AC
  Administered 2022-07-11: 1 via ORAL
  Filled 2022-07-11: qty 1

## 2022-07-11 MED ORDER — LOSARTAN POTASSIUM 25 MG PO TABS: 25.0000 mg | ORAL_TABLET | Freq: Every day | ORAL | 3 refills | Status: DC

## 2022-07-11 NOTE — ED Notes (Signed)
RN reviewed discharge instructions with pt. Pt verbalized understanding and had no further questions. VSS upon discharge.  

## 2022-07-11 NOTE — Discharge Instructions (Addendum)
It was a pleasure taking care of you today!  Your ultrasound showed concern for chronic cholecystitis (inflammation of gallbladder), this can be followed by the on-call surgeon (information attached). Your urine showed concern for a urine infection at this time. You will be sent a prescription for Keflex, take as directed. Follow up with your care team regarding todays ED visit. Attached is information for the on-call surgery group, call and set up a follow up appointment regarding todays ED visit. Ensure to maintain fluid intake. You have also been sent a short course of norco to take for break through pain, do not take tylenol at the same time as you take this medication. Do not drive or operate heavy machinery as this medication can make you sleepy/drowsy. Return to the ED if you are experiencing increasing/worsening symptoms.

## 2022-07-11 NOTE — ED Notes (Signed)
US at bedside

## 2022-07-11 NOTE — ED Provider Notes (Signed)
[place] EMERGENCY DEPARTMENT AT [facility] Provider Note   CSN: [id number] Arrival date & time: 07/11/22  1043     History  Chief Complaint  Patient presents with   Back Pain    Joseph Malone is a 89 y.o. male with a PMHx of CHF, CAD, who presents to the ED with concerns for constant left lower back pain onset 5 days. He notes that his pain became worse last night. He notes that he fell 5 days ago prior to the onset of his symptoms. He was trying to get out of bed when he became weak and fell. Reports the following day that he was trying to get out of his recliner when he became dizzy and slid to the floor. Denies hitting his head or LOC during the two falls. Has associated nausea, hematuria (x 1 drop of blood), left hip pain. He has been walking since the fall and typically walks with a cane. He is active and works out at [employer] multiple times a week. No meds tried. Denies chest pain, shortness of breath, vomiting, numbness, tingling.   The history is provided by the patient. No language interpreter was used.       Home Medications Prior to Admission medications   Medication Sig Start Date End Date Taking? Authorizing Provider  cephALEXin (KEFLEX) 500 MG capsule Take 1 capsule (500 mg total) by mouth 2 (two) times daily for 7 days. 07/11/22 07/18/22 Yes Tarris Delbene A, PA-C  HYDROcodone-acetaminophen (NORCO/VICODIN) 5-325 MG tablet Take 1 tablet by mouth every 6 (six) hours as needed. 07/11/22  Yes Soul Deveney A, PA-C  acetaminophen (TYLENOL) 500 MG tablet Take 500 mg by mouth at bedtime.     [provider]  aspirin 81 MG tablet Take 1 tablet (81 mg total) by mouth every evening. 12/28/13   Bensimhon, Bevelyn Buckles, MD  Blood Glucose Monitoring Suppl (ONE TOUCH ULTRA 2) w/Device KIT Use to test blood sugars daily. Dx: E11.9 04/08/21   Shelva Majestic, MD  Cholecalciferol (VITAMIN D3) 2000 UNITS TABS Take 2,000 Units by mouth daily.    [provider]   folic acid (FOLVITE) 400 MCG tablet Take 400 mcg by mouth daily.    [provider]  furosemide (LASIX) 40 MG tablet Take 1 tablet (40 mg total) by mouth daily as needed. 10/11/21   Bensimhon, Bevelyn Buckles, MD  Glucosamine HCl 1500 MG TABS Take 1,500 mg by mouth every evening.     [provider]  glucose blood test strip Use to test blood sugars daily. Dx: E11.9 06/24/22   Shelva Majestic, MD  Lancets [facility] DELICA PLUS LANCET30G) MISC Use to test blood sugars daily. Dx: E11.9 06/24/22   Shelva Majestic, MD  losartan (COZAAR) 25 MG tablet Take 1 tablet (25 mg total) by mouth daily. 07/11/22 10/09/22  Alen Bleacher, NP  Melatonin 5 MG CHEW Chew 1 tablet by mouth daily as needed.    [provider]  metFORMIN (GLUCOPHAGE-XR) 500 MG 24 hr tablet TAKE 1 TABLET(500 MG) BY MOUTH DAILY WITH BREAKFAST 05/30/22   Shelva Majestic, MD  Multiple Vitamin (MULTIVITAMIN WITH MINERALS) TABS Take 1 tablet by mouth daily.     [provider]  nitroGLYCERIN (NITROSTAT) 0.4 MG SL tablet Place 1 tablet (0.4 mg total) under the tongue every 5 (five) minutes as needed for chest pain. 1 tablet every 5 minutes for 3 dose only 02/11/22 07/11/23  [place], Anderson Malta, FNP  OneTouch  Delica Lancets 30G MISC USE TO TEST BLOOD SUGAR DAILY 04/24/21   Shelva Majestic, MD  pantoprazole (PROTONIX) 40 MG tablet Take 1 tablet (40 mg total) by mouth daily. 01/18/22 07/11/23  Darlin Drop, DO  Rectal Protectant-Emollient (CALMOL-4) 76-10 % SUPP Use as directed once to twice daily Patient taking differently: Use as directed once to twice daily as needed 02/17/22   Armbruster, Willaim Rayas, MD  simvastatin (ZOCOR) 40 MG tablet Take 0.5 tablets (20 mg total) by mouth at bedtime. 03/28/22   Bensimhon, Bevelyn Buckles, MD  spironolactone (ALDACTONE) 25 MG tablet Take 0.5 tablets (12.5 mg total) by mouth daily. 09/09/21   Shelva Majestic, MD  tamsulosin (FLOMAX) 0.4 MG CAPS capsule Take 0.4 mg by mouth at bedtime. 12/13/19    [provider]  tiZANidine (ZANAFLEX) 2 MG tablet TAKE 1 TABLET(2 MG) BY MOUTH DAILY AS NEEDED FOR MUSCLE SPASMS. DO NOT DRIVE FOR 8 HOURS AFTER USE Patient taking differently: Take 2 mg by mouth every 6 (six) hours as needed for muscle spasms. 06/20/21   Shelva Majestic, MD  vitamin B-12 (CYANOCOBALAMIN) 100 MCG tablet Take 100 mcg by mouth daily.    [provider]  warfarin (COUMADIN) 5 MG tablet TAKE 1 TABLET BY MOUTH ON MONDAY, WEDNESDAY, AND FRIDAY, AND TAKE ONE-HALF TABLET ALL OTHER DAYS OR AS DIRECTED 05/15/22   Shelva Majestic, MD      Allergies    Penicillins    Review of Systems   Review of Systems  Musculoskeletal:  Positive for back pain.  All other systems reviewed and are negative.   Physical Exam Updated Vital Signs BP 122/72 (BP Location: Left Arm)   Pulse 85   Temp 98.8 F (37.1 C) (Oral)   Resp 15   Ht 5\' 11"  (1.803 m)   Wt 99.2 kg   SpO2 96%   BMI 30.50 kg/m  Physical Exam Vitals and nursing note reviewed.  Constitutional:      General: He is not in acute distress.    Appearance: Normal appearance.  Eyes:     General: No scleral icterus.    Extraocular Movements: Extraocular movements intact.  Cardiovascular:     Rate and Rhythm: Normal rate.  Pulmonary:     Effort: Pulmonary effort is normal. No respiratory distress.  Abdominal:     Palpations: Abdomen is soft. There is no mass.     Tenderness: There is abdominal tenderness in the right upper quadrant.     Comments: Mild right upper quadrant tenderness to palpation.  Musculoskeletal:        General: Normal range of motion.     Cervical back: Neck supple.     Comments: No spinal TTP. No TTP noted to musculature of back. Mild TTP noted to left lateral hip with FROM of left hip.   Skin:    General: Skin is warm and dry.     Findings: No rash.  Neurological:     Mental Status: He is alert.     Sensory: Sensation is intact.     Motor: Motor function is intact.  Psychiatric:         Behavior: Behavior normal.     ED Results / Procedures / Treatments   Labs (all labs ordered are listed, but only abnormal results are displayed) Labs Reviewed  CBC WITH DIFFERENTIAL/PLATELET - Abnormal; Notable for the following components:      Result Value   WBC 12.3 (*)    Platelets 131 (*)  Neutro Abs 9.4 (*)    Abs Immature Granulocytes 0.12 (*)    All other components within normal limits  URINALYSIS, ROUTINE W REFLEX MICROSCOPIC - Abnormal; Notable for the following components:   Hgb urine dipstick LARGE (*)    Protein, ur TRACE (*)    Nitrite POSITIVE (*)    Leukocytes,Ua MODERATE (*)    Bacteria, UA MANY (*)    Non Squamous Epithelial 0-5 (*)    All other components within normal limits  CBG MONITORING, ED - Abnormal; Notable for the following components:   Glucose-Capillary 128 (*)    All other components within normal limits  TROPONIN I (HIGH SENSITIVITY) - Abnormal; Notable for the following components:   Troponin I (High Sensitivity) 22 (*)    All other components within normal limits  TROPONIN I (HIGH SENSITIVITY) - Abnormal; Notable for the following components:   Troponin I (High Sensitivity) 22 (*)    All other components within normal limits  URINE CULTURE  HEPATIC FUNCTION PANEL  LIPASE, BLOOD    EKG EKG Interpretation  Date/Time:  Friday Jul 11 2022 12:29:50 EDT Ventricular Rate:  87 PR Interval:  167 QRS Duration: 113 QT Interval:  354 QTC Calculation: 424 R Axis:   59 Text Interpretation: Sinus arrhythmia Low voltage, precordial leads Probable anteroseptal infarct, old No significant change since last tracing Confirmed by Gwyneth Sprout ([id number]) on 07/11/2022 1:00:25 PM  Radiology US Abdomen Limited RUQ (LIVER/GB)  Result Date: 07/11/2022 CLINICAL DATA:  Left back and right hip pain. EXAM: ULTRASOUND ABDOMEN LIMITED RIGHT UPPER QUADRANT COMPARISON:  CT abdomen pelvis 07/11/2022 FINDINGS: Gallbladder: Gallstones: None Sludge: Present  Gallbladder Wall: Mildly thickened measuring up to 6 mm Pericholecystic fluid: None Sonographic Murphy's Sign: Negative per technologist Common bile duct: Diameter: 8 mm Liver: Parenchymal echogenicity: Within normal limits Contours: Normal Lesions: None Portal vein: Patent.  Hepatopetal flow Other: None. IMPRESSION: Gallbladder sludge with mild wall thickening can be seen with chronic cholecystitis. This can be better evaluated with HIDA scan. Electronically Signed   By: Acquanetta Belling M.D.   On: 07/11/2022 16:12   CT ABDOMEN PELVIS WO CONTRAST  Result Date: 07/11/2022 CLINICAL DATA:  Abdominal pain, acute, nonlocalized. EXAM: CT ABDOMEN AND PELVIS WITHOUT CONTRAST TECHNIQUE: Multidetector CT imaging of the abdomen and pelvis was performed following the standard protocol without IV contrast. RADIATION DOSE REDUCTION: This exam was performed according to the departmental dose-optimization program which includes automated exposure control, adjustment of the mA and/or kV according to patient size and/or use of iterative reconstruction technique. COMPARISON:  02/19/2021 and 10/13/2018 FINDINGS: Lower chest: 3 mm nodule in the left lower lobe on image 6/4 is stable since 2020 and compatible with a benign finding. No acute findings at the lung bases. ICD leads in the right ventricle. Hepatobiliary: Normal appearance of the liver. Concern for asymmetric wall thickening of the gallbladder best seen on image 34/2. Gallbladder is mildly distended. No significant pericholecystic stranding or fluid. No significant intrahepatic or extrahepatic biliary dilatation. Pancreas: Unremarkable. No pancreatic ductal dilatation or surrounding inflammatory changes. Spleen: Normal in size without focal abnormality. Adrenals/Urinary Tract: Adrenal glands are stable and within normal limits. No hydronephrosis. Normal appearance of the urinary bladder. Subtle calcifications in the medullary region of both kidneys. Findings could represent  tiny calculi versus mild medullary calcinosis. Findings are similar to the previous examination. No evidence for ureter stones. Stomach/Bowel: Extensive diverticulosis involving the sigmoid colon without evidence of acute inflammation. Normal appearance of the stomach. Again noted are  mildly dilated loops of proximal jejunum. Largest small bowel loop measures up to 3.6 cm on image 45/2. Proximal jejunum was slightly dilated on the previous examination as well. No significant inflammatory changes around the proximal jejunum. Vascular/Lymphatic: Aortic atherosclerosis. No enlarged abdominal or pelvic lymph nodes. Reproductive: Prostate enlargement measuring 5.2 cm in the transverse dimension. Other: Again noted is a small right inguinal hernia with stable low-density structure measuring up to 1.3 cm that has fluid density. This material or structure in the right inguinal canal is indeterminate but stable. No free fluid in the abdomen or pelvis. Negative for free air. Small umbilical hernia containing fat. Musculoskeletal: Prior median sternotomy. Multilevel disc space narrowing in the lumbar spine. No acute bone abnormality. IMPRESSION: 1. Gallbladder is mildly distended and concern for asymmetric wall thickening. Gallbladder inflammation cannot be excluded and this could be further characterized with right upper quadrant ultrasound. 2. Mildly dilated loops of proximal jejunum. There was a similar finding on the exam from 2023. This finding is nonspecific and recommend clinical correlation in this area. 3. Tiny densities or calculi in both kidneys. Findings could be associated with tiny renal calculi versus mild medullary calcinosis. Findings are similar to the previous examination. No hydronephrosis. 4.  Aortic Atherosclerosis (ICD10-I70.0). 5. Prostate enlargement. Electronically Signed   By: Richarda Overlie M.D.   On: 07/11/2022 13:25    Procedures Procedures    Medications Ordered in ED Medications   HYDROcodone-acetaminophen (NORCO/VICODIN) 5-325 MG per tablet 1 tablet (1 tablet Oral Given 07/11/22 1217)    ED Course/ Medical Decision Making/ A&P Clinical Course as of 07/11/22 1854  Fri Jul 11, 2022  1817 Confirmed with patient that he is able to take keflex without issues.  Discussed discharge treatment plan.  Pt appears safe for discharge.  [SB]    Clinical Course User Index [SB] Elisabella Hacker A, PA-C                              Medical Decision Making Amount and/or Complexity of Data Reviewed Labs: ordered. Radiology: ordered.  Risk Prescription drug management.   Pt presents with concerns for left lower back pain onset 5 days.  Also notes abdominal pain and diarrhea and has been taking antidiarrheals.  Patient afebrile, not tachycardic or hypoxic.  On exam patient with mild tenderness to palpation noted to right upper quadrant region.  No spinal tenderness to palpation.  Mild tenderness palpation noted to left lateral hip with full active range of motion.  Patient able to ambulate without assistance or difficulty.  No acute cardiovascular, respiratory exam findings.  Differential diagnosis includes cholecystitis, pancreatitis, fracture, dislocation, herniation, pyelonephritis, nephrolithiasis.  Co morbidities that complicate the patient evaluation: CHF, CAD  Labs:  I ordered, and personally interpreted labs.  The pertinent results include:   Initial troponin at 22, delta troponin at 22, stable from previous value couple days ago, likely elevated in the setting of patient's multiple comorbidities (patient without concerns for chest pain at this time and was evaluated by his cardiologist today prior to the ED visit) Hepatic function panel negative Urinalysis notable for large amount of hemoglobin, nitrite positive, moderate leuks, many bacteria (urine culture sent with results pending at time of discharge) CBG at 128 CBC with leukocytosis at 12.3 otherwise  unremarkable  Imaging: I ordered imaging studies including CT abdomen pelvis, right upper quadrant ultrasound I independently visualized and interpreted imaging which showed: CT abdomen pelvis with  1.  Gallbladder is mildly distended and concern for asymmetric wall  thickening. Gallbladder inflammation cannot be excluded and this  could be further characterized with right upper quadrant ultrasound.  2. Mildly dilated loops of proximal jejunum. There was a similar  finding on the exam from 2023. This finding is nonspecific and  recommend clinical correlation in this area.  3. Tiny densities or calculi in both kidneys. Findings could be  associated with tiny renal calculi versus mild medullary calcinosis.  Findings are similar to the previous examination. No hydronephrosis.  4.  Aortic Atherosclerosis (ICD10-I70.0).  5. Prostate enlargement.   Right upper quadrant ultrasound with  Gallbladder sludge with mild wall thickening can be seen with  chronic cholecystitis.    I agree with the radiologist interpretation  Medications:  I ordered medication including Norco for pain management Reevaluation of the patient after these medicines and interventions, I reevaluated the patient and found that they have improved I have reviewed the patients home medicines and have made adjustments as needed   Disposition: Presenting suspicious for acute cystitis, acute left-sided back pain, chronic cholecystitis.  Doubt concerns at this time for pyelonephritis, fracture, dislocation, herniation, pancreatitis. After consideration of the diagnostic results and the patients response to treatment, I feel that the patient would benefit from Discharge home.  Attending evaluated patient and recommends discharge treatment plan with treatment for acute cystitis and follow-up with [facility] due to chronic cholecystitis found incidentally on ultrasound findings today.  Patient sent with a prescription for  Keflex.  PDMP reviewed, patient sent with a short course of Norco.  Supportive care measures and strict return precautions discussed with patient at bedside. Pt acknowledges and verbalizes understanding. Pt appears safe for discharge. Follow up as indicated in discharge paperwork.    This chart was dictated using voice recognition software, Dragon. Despite the best efforts of this provider to proofread and correct errors, errors may still occur which can change documentation meaning.   Final Clinical Impression(s) / ED Diagnoses Final diagnoses:  Acute cystitis with hematuria  Acute left-sided low back pain without sciatica  Chronic cholecystitis    Rx / DC Orders ED Discharge Orders          Ordered    cephALEXin (KEFLEX) 500 MG capsule  2 times daily        07/11/22 1818    HYDROcodone-acetaminophen (NORCO/VICODIN) 5-325 MG tablet  Every 6 hours PRN        07/11/22 1818              Tavaris Eudy A, PA-C 07/11/22 1854    Gwyneth Sprout, MD 07/12/22 2049

## 2022-07-11 NOTE — Patient Instructions (Addendum)
Thank you for coming in today  If you had labs drawn today, any labs that are abnormal the clinic will call you No news is good news  Medications: STOP Coreg DECREASE Losartan 25 mg daily  Follow up appointments:  Your physician recommends that you schedule a follow-up appointment in:  3 months With Dr. Gala Romney    Do the following things EVERYDAY: Weigh yourself in the morning before breakfast. Write it down and keep it in a log. Take your medicines as prescribed Eat low salt foods--Limit salt (sodium) to 2000 mg per day.  Stay as active as you can everyday Limit all fluids for the day to less than 2 liters   At the [facility], you and your health needs are our priority. As part of our continuing mission to provide you with exceptional heart care, we have created designated Provider Care Teams. These Care Teams include your primary Cardiologist (physician) and Advanced Practice Providers (APPs- Physician Assistants and Nurse Practitioners) who all work together to provide you with the care you need, when you need it.   You may see any of the following providers on your designated Care Team at your next follow up: Dr Arvilla Meres Dr Marca Ancona Dr. Marcos Eke, NP Robbie Lis, [place] [facility] [place], [place] Brynda Peon, NP Karle Plumber, PharmD   Please be sure to bring in all your medications bottles to every appointment.    Thank you for choosing [facility]  If you have any questions or concerns before your next appointment please send [place] a message through [place] or call our office at [phone].    TO LEAVE A MESSAGE FOR THE NURSE SELECT OPTION 2, PLEASE LEAVE A MESSAGE INCLUDING: YOUR NAME DATE OF BIRTH CALL BACK NUMBER REASON FOR CALL**this is important as we prioritize the call backs  YOU WILL RECEIVE A CALL BACK THE SAME DAY AS LONG AS YOU CALL BEFORE 4:00 PM

## 2022-07-11 NOTE — ED Notes (Signed)
Pt and family updated that there are 2 pts ahead of him for [place]

## 2022-07-11 NOTE — ED Notes (Signed)
ED Provider at bedside. 

## 2022-07-11 NOTE — ED Triage Notes (Signed)
Lower back pain since last  week and fell after being seen in ER he states states got weak , pain hurts and he cannot sleep or move and he is not moving as before he states , just came from [place] dr   and was cleared to come here

## 2022-07-12 LAB — URINE CULTURE

## 2022-07-13 LAB — URINE CULTURE: Culture: 80000 — AB

## 2022-07-14 ENCOUNTER — Ambulatory Visit: Payer: Self-pay

## 2022-07-14 ENCOUNTER — Telehealth (HOSPITAL_BASED_OUTPATIENT_CLINIC_OR_DEPARTMENT_OTHER): Payer: Self-pay | Admitting: *Deleted

## 2022-07-14 ENCOUNTER — Telehealth: Payer: Self-pay

## 2022-07-14 NOTE — Telephone Encounter (Signed)
Post ED Visit - Positive Culture Follow-up  Culture report reviewed by antimicrobial stewardship pharmacist: [facility]  []  Daylene Posey, Pharm.D., BCPS AQ-ID []  Garvin Fila, Pharm.D., BCPS []  Georgina Pillion, Pharm.D., BCPS []  Parkin, [address].D., BCPS, AAHIVP []  Estella Husk, Pharm.D., BCPS, AAHIVP []  Lysle Pearl, PharmD, BCPS []  Phillips Climes, PharmD, BCPS []  Agapito Games, PharmD, BCPS []  Verlan Friends, PharmD []  Mervyn Gay, PharmD, BCPS []  Vinnie Level, PharmD  [facility] []  Len Childs, PharmD []  Greer Pickerel, PharmD []  Adalberto Cole, PharmD []  Perlie Gold, Rph []  Lonell Face) Jean Rosenthal, PharmD []  Earl Many, PharmD []  Junita Push, PharmD []  Dorna Leitz, PharmD []  Terrilee Files, PharmD []  Lynann Beaver, PharmD []  Keturah Barre, PharmD []  Loralee Pacas, PharmD []  Bernadene Person, PharmD   Positive urine culture Treated with Cephalexin, organism sensitive to the same and no further patient follow-up is required at this time.  [facility] 07/14/2022, 7:35 AM

## 2022-07-14 NOTE — Transitions of Care (Post Inpatient/ED Visit) (Signed)
07/14/2022  Name: Joseph Malone MRN: [id number] DOB: 06/20/1930  Today's TOC FU Call Status: Today's TOC FU Call Status:: Successful TOC FU Call Competed TOC FU Call Complete Date: 07/14/22   Transition Care Management Follow-up Telephone Call Date of Discharge: 07/11/22 Discharge Facility: [facility] ([facility]) Type of Discharge: Emergency Department Reason for ED Visit: Other: ("acute cystitis") How have you been since you were released from the hospital?:  (pt states he is drinking plenty of water to help with UTI. He fell getting out of shower yest-called 911 to help him get up. Deneis any pain,brusies or injuries.) Any questions or concerns?: No  Items Reviewed: Did you receive and understand the discharge instructions provided?: No Medications obtained,verified, and reconciled?: Yes (Medications Reviewed) Any new allergies since your discharge?: No Dietary orders reviewed?: Yes Type of Diet Ordered:: low salt/heart healthy/carb modified Do you have support at home?: Yes People in Home: alone, child(ren), adult Name of Support/Comfort Primary Source: daughter and son live nearby and check on pt daily  Medications Reviewed Today: Medications Reviewed Today     Reviewed by Charlyn Minerva, RN (Registered Nurse) on 07/14/22 at [phone]  Med List Status: <None>   Medication Order Taking? Sig Documenting Provider Last Dose Status Informant  acetaminophen (TYLENOL) 500 MG tablet [id number] Yes Take 500 mg by mouth at bedtime.  [provider] Taking Active Multiple Informants  aspirin 81 MG tablet [id number] Yes Take 1 tablet (81 mg total) by mouth every evening. Bensimhon, Bevelyn Buckles, MD Taking Active Multiple Informants  Blood Glucose Monitoring Suppl (ONE TOUCH ULTRA 2) w/Device KIT [id number] Yes Use to test blood sugars daily. Dx: E11.9 Shelva Majestic, MD Taking Active Multiple Informants  cephALEXin (KEFLEX) 500 MG capsule [id number] Yes Take 1 capsule (500  mg total) by mouth 2 (two) times daily for 7 days. Blue, Soijett A, PA-C Taking Active   Cholecalciferol (VITAMIN D3) 2000 UNITS TABS [id number] Yes Take 2,000 Units by mouth daily. [provider] Taking Active Multiple Informants  folic acid (FOLVITE) 400 MCG tablet [id number] Yes Take 400 mcg by mouth daily. [provider] Taking Active Multiple Informants  furosemide (LASIX) 40 MG tablet [id number] Yes Take 1 tablet (40 mg total) by mouth daily as needed. Bensimhon, Bevelyn Buckles, MD Taking Active Multiple Informants  Glucosamine HCl 1500 MG TABS [id number] Yes Take 1,500 mg by mouth every evening.  [provider] Taking Active Multiple Informants  glucose blood test strip [id number] Yes Use to test blood sugars daily. Dx: E11.9 Shelva Majestic, MD Taking Active   HYDROcodone-acetaminophen (NORCO/VICODIN) 5-325 MG tablet [id number] Yes Take 1 tablet by mouth every 6 (six) hours as needed. Blue, Soijett A, PA-C Taking Active   Lancets [facility] DELICA PLUS [place]) MISC [id number] Yes Use to test blood sugars daily. Dx: E11.9 Shelva Majestic, MD Taking Active   losartan (COZAAR) 25 MG tablet [id number] Yes Take 1 tablet (25 mg total) by mouth daily. Alen Bleacher, NP Taking Active   Melatonin 5 MG CHEW [id number] Yes Chew 1 tablet by mouth daily as needed. [provider] Taking Active Multiple Informants  metFORMIN (GLUCOPHAGE-XR) 500 MG 24 hr tablet [id number] Yes TAKE 1 TABLET(500 MG) BY MOUTH DAILY WITH BREAKFAST Shelva Majestic, MD Taking Active   Multiple Vitamin (MULTIVITAMIN WITH MINERALS) TABS [id number] Yes Take 1 tablet by mouth daily.  [provider] Taking Active Multiple Informants  nitroGLYCERIN (NITROSTAT) 0.4 MG SL tablet [id number] Yes Place 1 tablet (0.4 mg total) under  the tongue every 5 (five) minutes as needed for chest pain. 1 tablet every 5 minutes for 3 dose only Amory, Anderson Malta, FNP Taking Active   OneTouch Delica Lancets 30G MISC  [id number] Yes USE TO TEST BLOOD SUGAR DAILY Shelva Majestic, MD Taking Active Multiple Informants  pantoprazole (PROTONIX) 40 MG tablet [id number] Yes Take 1 tablet (40 mg total) by mouth daily. Darlin Drop, DO Taking Active   Rectal Protectant-Emollient (CALMOL-4) 76-10 % SUPP [id number] Yes Use as directed once to twice daily  Patient taking differently: Use as directed once to twice daily as needed   Armbruster, Willaim Rayas, MD Taking Active   simvastatin (ZOCOR) 40 MG tablet [id number] Yes Take 0.5 tablets (20 mg total) by mouth at bedtime. Bensimhon, Bevelyn Buckles, MD Taking Active   spironolactone (ALDACTONE) 25 MG tablet [id number] Yes Take 0.5 tablets (12.5 mg total) by mouth daily. Shelva Majestic, MD Taking Active Multiple Informants  tamsulosin [facility]) 0.4 MG CAPS capsule [id number] Yes Take 0.4 mg by mouth at bedtime. [provider] Taking Active Multiple Informants  tiZANidine (ZANAFLEX) 2 MG tablet [id number] Yes TAKE 1 TABLET(2 MG) BY MOUTH DAILY AS NEEDED FOR MUSCLE SPASMS. DO NOT DRIVE FOR 8 HOURS AFTER USE  Patient taking differently: Take 2 mg by mouth every 6 (six) hours as needed for muscle spasms.   Shelva Majestic, MD Taking Active Multiple Informants  vitamin B-12 (CYANOCOBALAMIN) 100 MCG tablet [id number] Yes Take 100 mcg by mouth daily. [provider] Taking Active   warfarin (COUMADIN) 5 MG tablet [id number] Yes TAKE 1 TABLET BY MOUTH ON MONDAY, WEDNESDAY, AND FRIDAY, AND TAKE ONE-HALF TABLET ALL OTHER DAYS OR AS DIRECTED Shelva Majestic, MD Taking Active             Home Care and Equipment/Supplies: Were Home Health Services Ordered?: NA Any new equipment or medical supplies ordered?: NA  Functional Questionnaire: Do you need assistance with bathing/showering or dressing?: No Do you need assistance with meal preparation?: No Do you need assistance with eating?: No Do you have difficulty maintaining continence: Yes Do you need assistance with  getting out of bed/getting out of a chair/moving?: No Do you have difficulty managing or taking your medications?: No  Follow up appointments reviewed: PCP Follow-up appointment confirmed?: Yes Date of PCP follow-up appointment?: 08/12/22 (pt states daughter made appt-hed id not want to see about changing appt to something sooner) Follow-up Provider: Dr. Durene Cal Specialist [facility] Follow-up appointment confirmed?: No Reason Specialist Follow-Up Not Confirmed: Patient has Specialist Provider Number and will Call for Appointment (pt will have daughter call [facility] office per d/c instructions to make f/u appt) Do you need transportation to your follow-up appointment?: No (pt states he drives or either his children take him to appts) Do you understand care options if your condition(s) worsen?: Yes-patient verbalized understanding  SDOH Interventions Today    Flowsheet Row Most Recent Value  SDOH Interventions   Food Insecurity Interventions Intervention Not Indicated  Transportation Interventions Intervention Not Indicated      TOC Interventions Today    Flowsheet Row Most Recent Value  TOC Interventions   TOC Interventions Discussed/Reviewed TOC Interventions Discussed      Interventions Today    Flowsheet Row Most Recent Value  General Interventions   General Interventions Discussed/Reviewed General Interventions Discussed, Doctor Visits, Durable Medical Equipment (DME)  Doctor Visits Discussed/Reviewed Doctor Visits Discussed, PCP, Specialist  Durable Medical Equipment (DME) Glucomoter  PCP/Specialist Visits Compliance with follow-up visit  Education Interventions   Education Provided Provided Education  Provided Verbal Education On When to see the doctor, Nutrition, Medication  Nutrition Interventions   Nutrition Discussed/Reviewed Nutrition Discussed, Adding fruits and vegetables, Decreasing salt, Decreasing sugar intake  Pharmacy Interventions   Pharmacy Dicussed/Reviewed  Pharmacy Topics Discussed, Medications and their functions  Safety Interventions   Safety Discussed/Reviewed Safety Discussed, Fall Risk, Home Safety  [pt has life alert system but has not hooked it up yet-will get family to assist him ASAP]  Home Safety Assistive Devices  [pt confirms he has waker in the home and uses, encouraged pt to discuss if HHPT needed with provider]       Antionette Fairy, RN,BSN,CCM [facility] Care Management Care Management Community Coordinator Direct Phone: [phone] Toll Free: [phone] Fax: [phone]

## 2022-07-14 NOTE — Chronic Care Management (AMB) (Signed)
   07/14/2022  Joseph Malone December 17, 1930 [id number]   Reason for Encounter: Patient is not currently enrolled in the CCM program. CCM status changed to previously enrolled.   [facility]/Chronic Care Management [phone]

## 2022-07-21 ENCOUNTER — Telehealth: Payer: Self-pay | Admitting: Family Medicine

## 2022-07-21 MED ORDER — TIZANIDINE HCL 2 MG PO TABS: 2.0000 mg | ORAL_TABLET | Freq: Four times a day (QID) | ORAL | 0 refills | Status: DC | PRN

## 2022-07-21 NOTE — Telephone Encounter (Signed)
Rx sent to pharmacy   

## 2022-07-21 NOTE — Telephone Encounter (Signed)
Prescription Request  07/21/2022  LOV: 06/02/2022  What is the name of the medication or equipment?  tiZANidine (ZANAFLEX) 2 MG tablet   Have you contacted your pharmacy to request a refill? No   Which pharmacy would you like this sent to? [id number] [place], [place] - [address] AT [facility] [address] [facility] [address] [facility] [address] [place] [address] Phone: [phone] Fax: [phone]   Patient notified that their request is being sent to the clinical staff for review and that they should receive a response within 2 business days.   Please advise at Mobile [phone] (mobile)

## 2022-07-22 ENCOUNTER — Telehealth (HOSPITAL_COMMUNITY): Payer: Self-pay

## 2022-07-22 NOTE — Telephone Encounter (Signed)
Patient left Triage message as he got message from office. Called patient back and informed him it was in relation to recent blood work. He will get repeat labs done on 07/24/22,  as he has follow up with General Cardiology that day

## 2022-07-24 ENCOUNTER — Encounter: Payer: Self-pay | Admitting: Nurse Practitioner

## 2022-07-24 ENCOUNTER — Ambulatory Visit: Payer: Medicare Other | Admitting: Nurse Practitioner

## 2022-07-24 VITALS — BP 102/64 | HR 81 | Ht 71.0 in | Wt 215.4 lb

## 2022-07-24 DIAGNOSIS — I25118 Atherosclerotic heart disease of native coronary artery with other forms of angina pectoris: Secondary | ICD-10-CM | POA: Diagnosis not present

## 2022-07-24 DIAGNOSIS — I5042 Chronic combined systolic (congestive) and diastolic (congestive) heart failure: Secondary | ICD-10-CM | POA: Diagnosis not present

## 2022-07-24 DIAGNOSIS — E118 Type 2 diabetes mellitus with unspecified complications: Secondary | ICD-10-CM

## 2022-07-24 DIAGNOSIS — Z7984 Long term (current) use of oral hypoglycemic drugs: Secondary | ICD-10-CM

## 2022-07-24 DIAGNOSIS — I10 Essential (primary) hypertension: Secondary | ICD-10-CM

## 2022-07-24 DIAGNOSIS — I255 Ischemic cardiomyopathy: Secondary | ICD-10-CM | POA: Diagnosis not present

## 2022-07-24 DIAGNOSIS — I513 Intracardiac thrombosis, not elsewhere classified: Secondary | ICD-10-CM

## 2022-07-24 DIAGNOSIS — E785 Hyperlipidemia, unspecified: Secondary | ICD-10-CM

## 2022-07-24 MED ORDER — ISOSORBIDE MONONITRATE ER 30 MG PO TB24: 15.0000 mg | ORAL_TABLET | Freq: Every day | ORAL | 3 refills | Status: DC

## 2022-07-24 NOTE — Progress Notes (Signed)
Office Visit    Patient Name: Joseph Malone Date of Encounter: 07/24/2022  Primary Care Provider:  Shelva Majestic, MD Primary Cardiologist:  None  Chief Complaint    89 year old male with a history of CAD s/p CABG x1 (LIMA-LAD), ICM, chronic combined systolic and diastolic heart failure, ICD, LV thrombus, hypertension, hyperlipidemia, and type 2 diabetes who presents for hospital follow-up related to CAD and chest pain.  Past Medical History    Past Medical History:  Diagnosis Date   Anxiety    BPH (benign prostatic hypertrophy)    CAD (coronary artery disease)    s/p CABG   CHF (congestive heart failure) (HCC)    Diabetes 1.5, managed as type 2 (HCC)    Diverticulosis    Diverticulosis of colon without hemorrhage 10/22/2007   Qualifier: Diagnosis of  By: Alwyn Ren MD, Chrissie Noa     DOE (dyspnea on exertion)    Excess weight    Fasting hyperglycemia    Generalized weakness    GERD (gastroesophageal reflux disease)    Heart attack (HCC) 2001   Heart failure    CHF due to ischemic CM   History of kidney stones    Hyperlipidemia    Hypertension    Ischemic cardiomyopathy    Microscopic hematuria    [facility]   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   Past Surgical History:  Procedure Laterality Date   APPENDECTOMY     BIOPSY  10/21/2018   Procedure: BIOPSY;  Surgeon: Benancio Deeds, MD;  Location: [facility];  Service: Gastroenterology;;   CARDIAC CATHETERIZATION  2000   CARDIAC DEFIBRILLATOR PLACEMENT  2005   [employer]; s/p ICD gen change and RV lead revision April 2015   CATARACT EXTRACTION  2008   bilateral with lens implant   COLONOSCOPY  2004   Tics; [facility]   COLONOSCOPY WITH PROPOFOL N/A 01/16/2022   Procedure: COLONOSCOPY WITH PROPOFOL;  Surgeon: Napoleon Form, MD;  Location: [facility];  Service: Gastroenterology;  Laterality: N/A;   CORONARY ARTERY BYPASS GRAFT  2000   1 vessel   CYSTOSCOPY  06/2012   Dr Margarita Grizzle    ESOPHAGOGASTRODUODENOSCOPY (EGD) WITH PROPOFOL N/A 10/21/2018   Procedure: ESOPHAGOGASTRODUODENOSCOPY (EGD) WITH PROPOFOL;  Surgeon: Benancio Deeds, MD;  Location: [facility];  Service: Gastroenterology;  Laterality: N/A;   HOT HEMOSTASIS N/A 10/21/2018   Procedure: HOT HEMOSTASIS (ARGON PLASMA COAGULATION/BICAP);  Surgeon: Benancio Deeds, MD;  Location: [facility];  Service: Gastroenterology;  Laterality: N/A;   IMPLANTABLE CARDIOVERTER DEFIBRILLATOR (ICD) GENERATOR CHANGE N/A 06/01/2013   Procedure: ICD GENERATOR CHANGE;  Surgeon: Marinus Maw, MD;  Location: [facility] CATH LAB;  Service: Cardiovascular;  Laterality: N/A;   LEAD REVISION N/A 06/01/2013   Procedure: LEAD REVISION;  Surgeon: Marinus Maw, MD;  Location: [facility] CATH LAB;  Service: Cardiovascular;  Laterality: N/A;   PILONIDAL CYST EXCISION     POLYPECTOMY  01/16/2022   Procedure: POLYPECTOMY;  Surgeon: Napoleon Form, MD;  Location: [facility];  Service: Gastroenterology;;   RIGHT/LEFT HEART CATH AND CORONARY/GRAFT ANGIOGRAPHY N/A 06/12/2017   Procedure: RIGHT/LEFT HEART CATH AND CORONARY/GRAFT ANGIOGRAPHY;  Surgeon: Dolores Patty, MD;  Location: [facility] INVASIVE CV LAB;  Service: Cardiovascular;  Laterality: N/A;   TRANSTHORACIC ECHOCARDIOGRAM  08/29/2005    Allergies  Allergies  Allergen Reactions   Penicillins Rash and Other (See Comments)    Because of a history of documented adverse serious drug reaction;Medi Alert bracelet  is recommended  Has patient had a PCN reaction causing immediate rash, facial/tongue/throat swelling, SOB or lightheadedness with hypotension: No Has patient had a PCN reaction causing severe rash involving mucus membranes or skin necrosis: No Has patient had a PCN reaction that required hospitalization: No Has patient had a PCN reaction occurring within the last 10 years: No If all of the above answers are "NO", t     Labs/Other Studies Reviewed    The following studies were reviewed  today:  Cardiac Studies & Procedures   CARDIAC CATHETERIZATION  CARDIAC CATHETERIZATION 06/12/2017  Narrative  Ost LAD to Prox LAD lesion is 100% stenosed.  Dist Cx lesion is 20% stenosed.  Mid RCA lesion is 20% stenosed.  Findings:  Ao = 143/77 (105) LV 145/8 RA = 3 RV = 26/4 PA = 27/5 (12) PCW = 9 Fick cardiac output/index = 4.4/2.0 PVR = 0.7 WU FA sat = 97% PA sat = 62%, 63%  Assessment: 1. Left-dominant coronary system with chronically-occluded LAD 2. Non-obstructive CAD elsewhere 3. Patent LIMA to LAD 4. LV gram not performed due to LV thrombus 5. Normal right heart pressure with mildly reduced cardiac output  Plan/Discussion:  Medical therapy.  Arvilla Meres, MD 11:32 AM  Findings Coronary Findings Diagnostic  Dominance: Left  Left Anterior Descending Ost LAD to Prox LAD lesion is 100% stenosed.  Left Circumflex Dist Cx lesion is 20% stenosed.  Right Coronary Artery Mid RCA lesion is 20% stenosed.  LIMA Graft To Mid LAD  Intervention  No interventions have been documented.     ECHOCARDIOGRAM  ECHOCARDIOGRAM COMPLETE 11/14/2021  Narrative ECHOCARDIOGRAM REPORT    Patient Name:   Joseph Malone Date of Exam: 11/14/2021 Medical Rec #:  [id number]      Height:       71.0 in Accession #:    [id number]     Weight:       225.6 lb Date of Birth:  Jun 23, 1930       BSA:          2.219 m Patient Age:    89 years       BP:           138/75 mmHg Patient Gender: M              HR:           70 bpm. Exam Location:  Inpatient  Procedure: 2D Echo, Cardiac Doppler and Color Doppler  Indications:    CHF  History:        Patient has prior history of Echocardiogram examinations, most recent 06/03/2017. CHF, CAD and Previous Myocardial Infarction, Pacemaker and Prior CABG, Arrythmias:PVC; Risk Factors:Hypertension, Dyslipidemia and Diabetes. Ischemic cardiomyopathy.  Sonographer:    Ross Ludwig RDCS (AE) Referring Phys: [id number] DANIEL R  BENSIMHON   Sonographer Comments: Technically difficult study due to poor echo windows. IMPRESSIONS   1. Left ventricular ejection fraction, by estimation, is 30 to 35%. The left ventricle has moderately decreased function. The left ventricle demonstrates regional wall motion abnormalities (see scoring diagram/findings for description). There is mild concentric left ventricular hypertrophy. Left ventricular diastolic parameters are consistent with Grade I diastolic dysfunction (impaired relaxation). There is akinesis of the left ventricular, mid-apical anteroseptal wall, anterior wall and apical segment. 2. Right ventricular systolic function is normal. The right ventricular size is normal. 3. The mitral valve is normal in structure. Mild mitral valve regurgitation. No evidence of mitral stenosis. 4. The aortic valve is tricuspid. There is mild calcification of  the aortic valve. Aortic valve regurgitation is not visualized. Aortic valve sclerosis/calcification is present, without any evidence of aortic stenosis. 5. The inferior vena cava is normal in size with greater than 50% respiratory variability, suggesting right atrial pressure of 3 mmHg.  FINDINGS Left Ventricle: Left ventricular ejection fraction, by estimation, is 30 to 35%. The left ventricle has moderately decreased function. The left ventricle demonstrates regional wall motion abnormalities. The left ventricular internal cavity size was normal in size. There is mild concentric left ventricular hypertrophy. Left ventricular diastolic parameters are consistent with Grade I diastolic dysfunction (impaired relaxation).  Right Ventricle: The right ventricular size is normal. No increase in right ventricular wall thickness. Right ventricular systolic function is normal.  Left Atrium: Left atrial size was normal in size.  Right Atrium: Right atrial size was normal in size.  Pericardium: There is no evidence of pericardial  effusion.  Mitral Valve: The mitral valve is normal in structure. Mild mitral valve regurgitation. No evidence of mitral valve stenosis.  Tricuspid Valve: The tricuspid valve is normal in structure. Tricuspid valve regurgitation is mild . No evidence of tricuspid stenosis.  Aortic Valve: The aortic valve is tricuspid. There is mild calcification of the aortic valve. Aortic valve regurgitation is not visualized. Aortic valve sclerosis/calcification is present, without any evidence of aortic stenosis. Aortic valve mean gradient measures 2.0 mmHg. Aortic valve peak gradient measures 4.5 mmHg. Aortic valve area, by VTI measures 2.46 cm.  Pulmonic Valve: The pulmonic valve was normal in structure. Pulmonic valve regurgitation is trivial. No evidence of pulmonic stenosis.  Aorta: The aortic root is normal in size and structure.  Venous: The inferior vena cava is normal in size with greater than 50% respiratory variability, suggesting right atrial pressure of 3 mmHg.  IAS/Shunts: No atrial level shunt detected by color flow Doppler.  Additional Comments: A device lead is visualized.   LEFT VENTRICLE PLAX 2D LVIDd:         4.90 cm      Diastology LVIDs:         3.80 cm      LV e' medial:    5.55 cm/s LV PW:         1.20 cm      LV E/e' medial:  10.7 LV IVS:        1.40 cm      LV e' lateral:   6.74 cm/s LVOT diam:     2.00 cm      LV E/e' lateral: 8.8 LV SV:         59 LV SV Index:   27 LVOT Area:     3.14 cm  LV Volumes (MOD) LV vol d, MOD A2C: 105.0 ml LV vol d, MOD A4C: 148.0 ml LV vol s, MOD A2C: 72.3 ml LV vol s, MOD A4C: 75.6 ml LV SV MOD A2C:     32.7 ml LV SV MOD A4C:     148.0 ml LV SV MOD BP:      53.1 ml  RIGHT VENTRICLE RV Basal diam:  3.70 cm RV S prime:     8.81 cm/s TAPSE (M-mode): 2.0 cm  LEFT ATRIUM           Index        RIGHT ATRIUM           Index LA diam:      2.70 cm 1.22 cm/m   RA Area:     19.00 cm LA Vol (A2C): 52.6  ml 23.70 ml/m  RA Volume:    54.90 ml  24.74 ml/m LA Vol (A4C): 50.9 ml 22.94 ml/m AORTIC VALVE AV Area (Vmax):    2.59 cm AV Area (Vmean):   2.60 cm AV Area (VTI):     2.46 cm AV Vmax:           106.00 cm/s AV Vmean:          72.100 cm/s AV VTI:            0.240 m AV Peak Grad:      4.5 mmHg AV Mean Grad:      2.0 mmHg LVOT Vmax:         87.50 cm/s LVOT Vmean:        59.700 cm/s LVOT VTI:          0.188 m LVOT/AV VTI ratio: 0.78  AORTA Ao Root diam: 3.20 cm Ao Asc diam:  3.50 cm  MITRAL VALVE MV Area (PHT): 3.01 cm    SHUNTS MV Decel Time: 252 msec    Systemic VTI:  0.19 m MV E velocity: 59.20 cm/s  Systemic Diam: 2.00 cm MV A velocity: 74.40 cm/s MV E/A ratio:  0.80  Arvilla Meres MD Electronically signed by Arvilla Meres MD Signature Date/Time: 11/14/2021/2:22:22 PM    Final    MONITORS  LONG TERM MONITOR (3-14 DAYS) 06/02/2020  Narrative Patch Wear Time:  7 days and 2 hours (2022-04-08T10:21:23-0400 to 2022-04-15T13:05:21-0400)  1. Sinus rhythm - avg HR of 74 bpm. 2. Three runs of nonsustained ventricular tachycardia occurred, the longest lasting 4 beats with an avg rate of 100 bpm. 3. Isolated PACs were frequent (11.3%, 85520) 4. Isolated PVCs were occasional (3.1%, 16109)  Arvilla Meres, MD 4:09 PM          Recent Labs: 07/05/2022: Magnesium 1.8 07/11/2022: ALT 39; B Natriuretic Peptide 201.7; BUN 57; Creatinine, Ser 1.75; Hemoglobin 14.0; Platelets 131; Potassium 3.9; Sodium 134  Recent Lipid Panel    Component Value Date/Time   CHOL 146 09/09/2021 1206   TRIG 136.0 09/09/2021 1206   HDL 47.20 09/09/2021 1206   CHOLHDL 3 09/09/2021 1206   VLDL 27.2 09/09/2021 1206   LDLCALC 71 09/09/2021 1206   LDLDIRECT 75.0 06/06/2022 1055    History of Present Illness    89 year old male with with the above past medical history including CAD s/p CABG x1 (LIMA-LAD), ICM, chronic combined systolic and diastolic heart failure, ICD, LV thrombus, hypertension, hyperlipidemia,  and type 2 diabetes.   He is followed in the heart failure clinic.  He was hospitalized in 2001 in the setting of acute myocardial infarction and underwent CABG x 1 (LIMA-LAD).  Most recent cardiac catheterization in May 2019 showed occluded LAD with patent LIMA-LAD, otherwise nonobstructive CAD.  He has a history of ICM.  Echo in 11/2021 showed EF 30 to 35%, mild LVH, G1 DD, akinesis of the mid anteroseptal, anterior, and apical walls, mild mitral valve regurgitation.  He also has a history of LV thrombus on chronic warfarin.  He has a history of intermittent chest discomfort which has been attributed to muscle strain and or indigestion.  He was hospitalized in December 2023 in the setting of GI bleed.  He was last seen in the heart failure clinic on 02/11/2022 and was stable overall from a cardiac standpoint.  He presented to ED on 07/05/2022 with complaints of chest pain. Troponin was minimally elevated with flat trend.  EKG was unremarkable. Cardiology was consulted.  Outpatient  follow-up was recommended.  He was seen in the heart failure clinic on 07/11/2022 and noted increased dizziness, low BP.  Carvedilol was discontinued, losartan was decreased to 25 mg daily.  He returned to the ED on 07/11/2022 in the setting of generalized weakness, fall.  He was treated for possible acute cystitis.  He presents today for follow-up.  Since his last visit and since his recent ED visits he has been stable overall from a cardiac standpoint.  He does note occasional lightheadedness upon standing.  He also notes progressive dyspnea on exertion.  He denies chest pain, edema, PND, orthopnea, weight gain.  Denies palpitations, dizziness, presyncope, syncope.  BP has been borderline low but overall improved with adjustment of BP meds.  Home Medications    Current Outpatient Medications  Medication Sig Dispense Refill   acetaminophen (TYLENOL) 500 MG tablet Take 500 mg by mouth at bedtime.      aspirin 81 MG tablet Take 1  tablet (81 mg total) by mouth every evening.     Blood Glucose Monitoring Suppl (ONE TOUCH ULTRA 2) w/Device KIT Use to test blood sugars daily. Dx: E11.9 1 kit 3   Cholecalciferol (VITAMIN D3) 2000 UNITS TABS Take 2,000 Units by mouth daily.     folic acid (FOLVITE) 400 MCG tablet Take 400 mcg by mouth daily.     furosemide (LASIX) 40 MG tablet Take 1 tablet (40 mg total) by mouth daily as needed. 30 tablet 3   Glucosamine HCl 1500 MG TABS Take 1,500 mg by mouth every evening.      glucose blood test strip Use to test blood sugars daily. Dx: E11.9 100 each 12   HYDROcodone-acetaminophen (NORCO/VICODIN) 5-325 MG tablet Take 1 tablet by mouth every 6 (six) hours as needed. 10 tablet 0   isosorbide mononitrate (IMDUR) 30 MG 24 hr tablet Take 0.5 tablets (15 mg total) by mouth daily. 45 tablet 3   Lancets (ONETOUCH DELICA PLUS LANCET30G) MISC Use to test blood sugars daily. Dx: E11.9 100 each 3   losartan (COZAAR) 25 MG tablet Take 1 tablet (25 mg total) by mouth daily. 90 tablet 3   Melatonin 5 MG CHEW Chew 1 tablet by mouth daily as needed.     metFORMIN (GLUCOPHAGE-XR) 500 MG 24 hr tablet TAKE 1 TABLET(500 MG) BY MOUTH DAILY WITH BREAKFAST 90 tablet 3   Multiple Vitamin (MULTIVITAMIN WITH MINERALS) TABS Take 1 tablet by mouth daily.      nitroGLYCERIN (NITROSTAT) 0.4 MG SL tablet Place 1 tablet (0.4 mg total) under the tongue every 5 (five) minutes as needed for chest pain. 1 tablet every 5 minutes for 3 dose only 90 tablet 3   OneTouch Delica Lancets 30G MISC USE TO TEST BLOOD SUGAR DAILY 100 each 3   pantoprazole (PROTONIX) 40 MG tablet Take 1 tablet (40 mg total) by mouth daily. 30 tablet 0   Rectal Protectant-Emollient (CALMOL-4) 76-10 % SUPP Use as directed once to twice daily (Patient taking differently: Use as directed once to twice daily as needed) 6 suppository 0   simvastatin (ZOCOR) 40 MG tablet Take 0.5 tablets (20 mg total) by mouth at bedtime. 45 tablet 3   spironolactone (ALDACTONE)  25 MG tablet Take 0.5 tablets (12.5 mg total) by mouth daily. 90 tablet 3   tamsulosin (FLOMAX) 0.4 MG CAPS capsule Take 0.4 mg by mouth at bedtime.     tiZANidine (ZANAFLEX) 2 MG tablet Take 1 tablet (2 mg total) by mouth every 6 (six) hours  as needed for muscle spasms. 90 tablet 0   vitamin B-12 (CYANOCOBALAMIN) 100 MCG tablet Take 100 mcg by mouth daily.     warfarin (COUMADIN) 5 MG tablet TAKE 1 TABLET BY MOUTH ON MONDAY, WEDNESDAY, AND FRIDAY, AND TAKE ONE-HALF TABLET ALL OTHER DAYS OR AS DIRECTED 100 tablet 1   No current facility-administered medications for this visit.     Review of Systems    He denies palpitations, pnd, orthopnea, n, v, dizziness, syncope, edema, weight gain, or early satiety. All other systems reviewed and are otherwise negative except as noted above.   Physical Exam    VS:  BP 102/64   Pulse 81   Ht 5\' 11"  (1.803 m)   Wt 215 lb 6.4 oz (97.7 kg)   SpO2 96%   BMI 30.04 kg/m   GEN: Well nourished, well developed, in no acute distress. HEENT: normal. Neck: Supple, no JVD, carotid bruits, or masses. Cardiac: RRR, no murmurs, rubs, or gallops. No clubbing, cyanosis, edema.  Radials/DP/PT 2+ and equal bilaterally.  Respiratory:  Respirations regular and unlabored, clear to auscultation bilaterally. GI: Soft, nontender, nondistended, BS + x 4. MS: no deformity or atrophy. Skin: warm and dry, no rash. Neuro:  Strength and sensation are intact. Psych: Normal affect.  Accessory Clinical Findings    ECG personally reviewed by me today -sinus rhythm, 82 bpm, sinus arrhythmia- no acute changes.   Lab Results  Component Value Date   WBC 12.3 (H) 07/11/2022   HGB 14.0 07/11/2022   HCT 40.9 07/11/2022   MCV 90.7 07/11/2022   PLT 131 (L) 07/11/2022   Lab Results  Component Value Date   CREATININE 1.75 (H) 07/11/2022   BUN 57 (H) 07/11/2022   NA 134 (L) 07/11/2022   K 3.9 07/11/2022   CL 101 07/11/2022   CO2 22 07/11/2022   Lab Results  Component Value  Date   ALT 39 07/11/2022   AST 31 07/11/2022   ALKPHOS 63 07/11/2022   BILITOT 0.6 07/11/2022   Lab Results  Component Value Date   CHOL 146 09/09/2021   HDL 47.20 09/09/2021   LDLCALC 71 09/09/2021   LDLDIRECT 75.0 06/06/2022   TRIG 136.0 09/09/2021   CHOLHDL 3 09/09/2021    Lab Results  Component Value Date   HGBA1C 5.9 (A) 06/02/2022    Assessment & Plan    1. CAD/chest pain: S/p CABG x 1 in 2001.  He notes a 2 to 3-week history of increased dyspnea on exertion.  He has chronic intermittent chest pain, this is overall unchanged.  No obvious signs of fluid volume overload.  Some concern for dyspnea as anginal equivalent.  We discussed possible ischemic evaluation, however, patient would prefer medical therapy at this time.  I think this is very reasonable.  Will start Imdur 15 mg daily.  Discussed ED precautions.  Continue to monitor symptoms.  Continue aspirin, losartan, spironolactone, and simvastatin.   2. ICM/chronic combined systolic and diastolic heart failure: S/p ICD.  Most recent device check in 04/2022 showed normal device function.  He notes progressive dyspnea on exertion.  Generally euvolemic and well compensated on exam.  Will check CBC, BNP, BMET today. Repeat echo scheduled for 07/25/2022.  Carvedilol recently discontinued in the setting of dizziness, hypotension.  No SGLT2 given history of UTIs. Continue losartan, spironolactone, Lasix.    3. LV thrombus: On Coumadin.  Repeat echo pending as above.  4. Hypertension: BP well controlled, has been low recently.  Continue to monitor  BP with addition of Imdur as above given recent hypotension with associated lightheadedness.  Continue current antihypertensive regimen.   5. Hyperlipidemia: LDL was 71 in 08/2021.  Continue simvastatin.  6. Type 2 diabetes: A1c was 5.9 in 05/2022.  Monitored and managed per PCP.  7. Disposition: Follow-up in 1 month with general cardiology APP.  Follow-up as scheduled with Dr. Ladona Ridgel in  08/2022, follow-up as scheduled with Dr. Gala Romney in 10/2022.      Joylene Grapes, NP 07/24/2022, 4:51 PM

## 2022-07-24 NOTE — Patient Instructions (Signed)
Medication Instructions:  Start Imdur 15 mg daily.  *If you need a refill on your cardiac medications before your next appointment, please call your pharmacy*   Lab Work: BNP, BMET, CBC today  Testing/Procedures: NONE ordered at this time of appointment     Follow-Up: At [facility], you and your health needs are our priority.  As part of our continuing mission to provide you with exceptional heart care, we have created designated Provider Care Teams.  These Care Teams include your primary Cardiologist (physician) and Advanced Practice Providers (APPs -  Physician Assistants and Nurse Practitioners) who all work together to provide you with the care you need, when you need it.  We recommend signing up for the patient portal called "MyChart".  Sign up information is provided on this After Visit Summary.  MyChart is used to connect with patients for Virtual Visits (Telemedicine).  Patients are able to view lab/test results, encounter notes, upcoming appointments, etc.  Non-urgent messages can be sent to your provider as well.   To learn more about what you can do with MyChart, go to [email or web address].    Your next appointment:   1 month(s)  Provider:   Bernadene Person, NP        Other Instructions

## 2022-07-25 ENCOUNTER — Ambulatory Visit (HOSPITAL_COMMUNITY)
Admission: RE | Admit: 2022-07-25 | Discharge: 2022-07-25 | Disposition: A | Discharge status: Home / Self Care | Payer: Medicare Other | Source: Ambulatory Visit | Attending: Nurse Practitioner | Admitting: Nurse Practitioner

## 2022-07-25 DIAGNOSIS — I252 Old myocardial infarction: Secondary | ICD-10-CM | POA: Insufficient documentation

## 2022-07-25 DIAGNOSIS — I255 Ischemic cardiomyopathy: Secondary | ICD-10-CM | POA: Insufficient documentation

## 2022-07-25 DIAGNOSIS — E119 Type 2 diabetes mellitus without complications: Secondary | ICD-10-CM | POA: Diagnosis not present

## 2022-07-25 DIAGNOSIS — I2510 Atherosclerotic heart disease of native coronary artery without angina pectoris: Secondary | ICD-10-CM | POA: Diagnosis not present

## 2022-07-25 DIAGNOSIS — E785 Hyperlipidemia, unspecified: Secondary | ICD-10-CM | POA: Insufficient documentation

## 2022-07-25 DIAGNOSIS — I5042 Chronic combined systolic (congestive) and diastolic (congestive) heart failure: Secondary | ICD-10-CM | POA: Insufficient documentation

## 2022-07-25 DIAGNOSIS — I5022 Chronic systolic (congestive) heart failure: Secondary | ICD-10-CM | POA: Diagnosis not present

## 2022-07-25 DIAGNOSIS — I110 Hypertensive heart disease with heart failure: Secondary | ICD-10-CM | POA: Diagnosis not present

## 2022-07-25 LAB — CBC
Hematocrit: 38.3 % (ref 37.5–51.0)
Hemoglobin: 13 g/dL (ref 13.0–17.7)
MCH: 30.5 pg (ref 26.6–33.0)
MCHC: 33.9 g/dL (ref 31.5–35.7)
MCV: 90 fL (ref 79–97)
Platelets: 330 10*3/uL (ref 150–450)
RBC: 4.26 x10E6/uL (ref 4.14–5.80)
RDW: 12.4 % (ref 11.6–15.4)
WBC: 7.2 10*3/uL (ref 3.4–10.8)

## 2022-07-25 LAB — BRAIN NATRIURETIC PEPTIDE: BNP: 79.8 pg/mL (ref 0.0–100.0)

## 2022-07-25 LAB — BASIC METABOLIC PANEL
BUN/Creatinine Ratio: 20 (ref 10–24)
BUN: 26 mg/dL (ref 10–36)
CO2: 21 mmol/L (ref 20–29)
Calcium: 9.7 mg/dL (ref 8.6–10.2)
Chloride: 104 mmol/L (ref 96–106)
Creatinine, Ser: 1.27 mg/dL (ref 0.76–1.27)
Glucose: 130 mg/dL — ABNORMAL HIGH (ref 70–99)
Potassium: 4.9 mmol/L (ref 3.5–5.2)
Sodium: 139 mmol/L (ref 134–144)
eGFR: 54 mL/min/{1.73_m2} — ABNORMAL LOW (ref >59)

## 2022-07-25 MED ORDER — PERFLUTREN LIPID MICROSPHERE
1.0000 mL | INTRAVENOUS | Status: AC | PRN
  Administered 2022-07-25: 4 mL via INTRAVENOUS

## 2022-07-27 LAB — ECHOCARDIOGRAM COMPLETE
AR max vel: 2.28 cm2
AV Area VTI: 2.22 cm2
AV Area mean vel: 2.04 cm2
AV Mean grad: 3.5 mmHg
AV Peak grad: 6.4 mmHg
Ao pk vel: 1.27 m/s
Area-P 1/2: 5.66 cm2
Calc EF: 60.8 %
Single Plane A2C EF: 56 %
Single Plane A4C EF: 63.9 %

## 2022-07-30 ENCOUNTER — Telehealth: Payer: Self-pay

## 2022-07-30 NOTE — Telephone Encounter (Signed)
Pt returned call. Pt was notified of lab results. Pt will continue current medication and f/u as planned.

## 2022-07-30 NOTE — Telephone Encounter (Signed)
Lmom to discuss lab results. Waiting on a return call.  

## 2022-07-31 ENCOUNTER — Telehealth: Payer: Self-pay | Admitting: Internal Medicine

## 2022-07-31 NOTE — Telephone Encounter (Signed)
Left voicemail message for the pt to call the clinic.

## 2022-07-31 NOTE — Telephone Encounter (Signed)
Pt c/o medication issue:  1. Name of Medication: isosorbide mononitrate (IMDUR) 30 MG 24 hr tablet   2. How are you currently taking this medication (dosage and times per day)?    3. Are you having a reaction (difficulty breathing--STAT)? no  4. What is your medication issue? Patient states this medication is causing him some issues. His bp (137/86 HR 75; 91/54 HR 106; 87/50 HR 102) is dropping low and causing him headaches. Please advise

## 2022-08-01 ENCOUNTER — Ambulatory Visit: Payer: Medicare Other

## 2022-08-01 DIAGNOSIS — I5022 Chronic systolic (congestive) heart failure: Secondary | ICD-10-CM

## 2022-08-01 LAB — CUP PACEART REMOTE DEVICE CHECK
Battery Remaining Longevity: 34 mo
Battery Voltage: 2.96 V
Brady Statistic RV Percent Paced: 0.02 %
Date Time Interrogation Session: 20240621032205
HighPow Impedance: 66 Ohm
Implantable Lead Connection Status: 753985
Implantable Lead Implant Date: 20150422
Implantable Lead Location: 753860
Implantable Lead Model: 6935
Implantable Pulse Generator Implant Date: 20150422
Lead Channel Impedance Value: 399 Ohm
Lead Channel Impedance Value: 456 Ohm
Lead Channel Pacing Threshold Amplitude: 0.625 V
Lead Channel Pacing Threshold Pulse Width: 0.4 ms
Lead Channel Sensing Intrinsic Amplitude: 12.875 mV
Lead Channel Sensing Intrinsic Amplitude: 12.875 mV
Lead Channel Setting Pacing Amplitude: 2 V
Lead Channel Setting Pacing Pulse Width: 0.4 ms
Lead Channel Setting Sensing Sensitivity: 0.3 mV

## 2022-08-01 NOTE — Telephone Encounter (Signed)
BP 137/78 upon waking prior to medications/food/drink, but after taking medications, he's becoming hypotensive 112/64. Following day,115/84, hour later 93/57. Third morning 137/86, then 91/54 (later same day) is 87/50. Condones headaches and weakness. Advised that he stop the Isosorbide altogether (he did verify that he's taking 15mg /half tablet daily). He does mention recent constipation and taking an OTC laxative which I informed he could be losing fluid volume which would worsen hypotension symptoms. He will hold Imdur for now, continue to monitor BP and we will call him with provider thoughts when available.  Per OV note by Bernadene Person, NP on 07/24/22: 1. CAD/chest pain: S/p CABG x 1 in 2001.  He notes a 2 to 3-week history of increased dyspnea on exertion.  He has chronic intermittent chest pain, this is overall unchanged.  No obvious signs of fluid volume overload.  Some concern for dyspnea as anginal equivalent.  We discussed possible ischemic evaluation, however, patient would prefer medical therapy at this time.  I think this is very reasonable.  Will start Imdur 15 mg daily.

## 2022-08-01 NOTE — Telephone Encounter (Signed)
Joylene Grapes, NP  You15 minutes ago (9:17 AM)   Agree with discontinuing Imdur. Thanks -EM   Returned call to patient and left message per DPR.

## 2022-08-06 ENCOUNTER — Ambulatory Visit (INDEPENDENT_AMBULATORY_CARE_PROVIDER_SITE_OTHER): Payer: Medicare Other

## 2022-08-06 DIAGNOSIS — Z7901 Long term (current) use of anticoagulants: Secondary | ICD-10-CM | POA: Diagnosis not present

## 2022-08-06 LAB — POCT INR: INR: 2.6 (ref 2.0–3.0)

## 2022-08-06 NOTE — Patient Instructions (Addendum)
Pre visit review using our clinic review tool, if applicable. No additional management support is needed unless otherwise documented below in the visit note.  Continue 1 tablet daily except 1/2 tablet on Mondays and Thursdays.  Recheck in 6 weeks. 

## 2022-08-06 NOTE — Progress Notes (Addendum)
Pt in ER for acute cystitis with hematuria on 5/31 and was prescribed keflex. Pt has finished abx. Pt reports he was taken off of Imdur and losartan dose was cut in half. Pt has ER f/u with PCP on 7/2.  Continue 1 tablet daily except 1/2 tablet on Mondays and Thursdays.  Recheck in 6 weeks.

## 2022-08-07 DIAGNOSIS — R1013 Epigastric pain: Secondary | ICD-10-CM | POA: Diagnosis not present

## 2022-08-12 ENCOUNTER — Ambulatory Visit (INDEPENDENT_AMBULATORY_CARE_PROVIDER_SITE_OTHER): Payer: Medicare Other | Admitting: Family Medicine

## 2022-08-12 ENCOUNTER — Encounter: Payer: Self-pay | Admitting: Family Medicine

## 2022-08-12 VITALS — BP 132/70 | HR 78 | Temp 98.3°F | Ht 71.0 in | Wt 219.2 lb

## 2022-08-12 DIAGNOSIS — I5022 Chronic systolic (congestive) heart failure: Secondary | ICD-10-CM | POA: Diagnosis not present

## 2022-08-12 DIAGNOSIS — I2510 Atherosclerotic heart disease of native coronary artery without angina pectoris: Secondary | ICD-10-CM

## 2022-08-12 DIAGNOSIS — E1151 Type 2 diabetes mellitus with diabetic peripheral angiopathy without gangrene: Secondary | ICD-10-CM | POA: Diagnosis not present

## 2022-08-12 DIAGNOSIS — E785 Hyperlipidemia, unspecified: Secondary | ICD-10-CM

## 2022-08-12 DIAGNOSIS — Z7984 Long term (current) use of oral hypoglycemic drugs: Secondary | ICD-10-CM

## 2022-08-12 DIAGNOSIS — I10 Essential (primary) hypertension: Secondary | ICD-10-CM

## 2022-08-12 DIAGNOSIS — E1169 Type 2 diabetes mellitus with other specified complication: Secondary | ICD-10-CM

## 2022-08-12 NOTE — Patient Instructions (Addendum)
Sign release of information at the check out desk for Diabetic Eye Exam.  Consider colace/docusate daily- these are gentle stool softeners OTC (available over the counter without a prescription). As back up could try capful of miralax mixed in water or even half capful daily but takes about 48 hours to work.   Let us know if you get any COVID vaccines this fall.  You are eligible to schedule your annual wellness visit with our nurse specialist Inetta Fermo.  Please consider scheduling this before you leave today  Glad you are feeling better- if new or worsening symptoms in regards to shortness of breath or feeling faint or chest pain please seek care as soon as possible   Recommended follow up: Return for next already scheduled visit or sooner if needed.

## 2022-08-12 NOTE — Progress Notes (Signed)
Phone [phone] In person visit   Subjective:   Joseph Malone is a 89 y.o. year old very pleasant male patient who presents for/with See problem oriented charting Chief Complaint  Patient presents with   ER f/u    Pt is here to f/u from ER visit     Past Medical History-  Patient Active Problem List   Diagnosis Date Noted   GI bleeding 01/13/2022    Priority: High   Apical mural thrombus 01/13/2022    Priority: High   Chronic systolic heart failure (HCC) 12/09/2010    Priority: High   Cardiomyopathy, ischemic 10/02/2009    Priority: High   Automatic implantable cardioverter-defibrillator in situ 01/16/2009    Priority: High   DM (diabetes mellitus) type II, controlled, with peripheral vascular disorder (HCC) 09/14/2008    Priority: High   CAD in native artery 09/25/2006    Priority: High   Atherosclerosis of aorta (HCC) 05/18/2020    Priority: Medium    Nephrolithiasis 03/24/2016    Priority: Medium    Hyperlipidemia associated with type 2 diabetes mellitus (HCC) 04/22/2007    Priority: Medium    Essential hypertension 09/25/2006    Priority: Medium    Long term (current) use of anticoagulants 07/08/2017    Priority: Low   Erectile dysfunction 01/19/2014    Priority: Low   Basal cell cancer 09/29/2012    Priority: Low   Solitary pulmonary nodule 07/12/2012    Priority: Low   BPH without obstruction/lower urinary tract symptoms 10/22/2007    Priority: Low   Osteoarthritis 09/25/2006    Priority: Low   Hematuria 09/25/2006    Priority: Low   Polyp of ascending colon 01/16/2022   Acute GI bleeding 01/14/2022   Chronic combined systolic and diastolic congestive heart failure (HCC) 01/13/2022   PVC's (premature ventricular contractions) 01/24/2020   Hematochezia     Medications- reviewed and updated Current Outpatient Medications  Medication Sig Dispense Refill   aspirin 81 MG tablet Take 1 tablet (81 mg total) by mouth every evening.      Cholecalciferol (VITAMIN D3) 2000 UNITS TABS Take 2,000 Units by mouth daily.     folic acid (FOLVITE) 400 MCG tablet Take 400 mcg by mouth daily.     furosemide (LASIX) 40 MG tablet Take 1 tablet (40 mg total) by mouth daily as needed. 30 tablet 3   Glucosamine HCl 1500 MG TABS Take 1,500 mg by mouth every evening.      losartan (COZAAR) 25 MG tablet Take 1 tablet (25 mg total) by mouth daily. 90 tablet 3   Melatonin 5 MG CHEW Chew 1 tablet by mouth daily as needed.     metFORMIN (GLUCOPHAGE-XR) 500 MG 24 hr tablet TAKE 1 TABLET(500 MG) BY MOUTH DAILY WITH BREAKFAST 90 tablet 3   Multiple Vitamin (MULTIVITAMIN WITH MINERALS) TABS Take 1 tablet by mouth daily.      nitroGLYCERIN (NITROSTAT) 0.4 MG SL tablet Place 1 tablet (0.4 mg total) under the tongue every 5 (five) minutes as needed for chest pain. 1 tablet every 5 minutes for 3 dose only (Patient not taking: Reported on 08/12/2022) 90 tablet 3   Rectal Protectant-Emollient (CALMOL-4) 76-10 % SUPP Use as directed once to twice daily (Patient taking differently: Use as directed once to twice daily as needed) 6 suppository 0   simvastatin (ZOCOR) 40 MG tablet Take 0.5 tablets (20 mg total) by mouth at bedtime. 45 tablet 3   spironolactone (ALDACTONE) 25 MG tablet Take  0.5 tablets (12.5 mg total) by mouth daily. 90 tablet 3   tamsulosin (FLOMAX) 0.4 MG CAPS capsule Take 0.4 mg by mouth at bedtime.     vitamin B-12 (CYANOCOBALAMIN) 100 MCG tablet Take 100 mcg by mouth daily.     warfarin (COUMADIN) 5 MG tablet TAKE 1 TABLET BY MOUTH ON MONDAY, WEDNESDAY, AND FRIDAY, AND TAKE ONE-HALF TABLET ALL OTHER DAYS OR AS DIRECTED (Patient taking differently: TAKE 1 TABLET BY MOUTH DAILY EXCEPT TAKE 1/2 TABLET ON MONDAY AND THURSDAY OR AS DIRECTED BY ANTICOAGULATION CLINIC) 100 tablet 1   No current facility-administered medications for this visit.     Objective:  BP 132/70   Pulse 78   Temp 98.3 F (36.8 C)   Ht 5\' 11"  (1.803 m)   Wt 219 lb 3.2 oz (99.4  kg)   SpO2 97%   BMI 30.57 kg/m  Gen: NAD, resting comfortably CV: RRR no murmurs rubs or gallops Lungs: CTAB no crackles, wheeze, rhonchi Abdomen: soft/nontender/nondistended/normal bowel sounds. No rebound or guarding.  Ext: trace edema Skin: warm, dry     Assessment and Plan   # ED visit follow-up S: Patient was seen on 07/11/2022 in the emergency department after having a near syncopal episode/minor fall (reports felt weak and slid down to floor in shower) associated with low blood pressure several days prior to visit that was improving since holding his blood pressure medicine.  He was having some left low back pain-labs with mild leukocytosis and urinalysis with some abnormalities so this was sent for culture which showed [name]- he reports did have frequency and was improved.  He also CT was abdomen pelvis due to concern for cholecystitis with history of nausea/vomiting/abdominal pain and diarrhea earlier in the week-gallbladder was mildly distended with some asymmetric wall thickening-ultrasound recommended-ultrasound ultimately showed gallbladder sludge with mild wall thickening with concern for chronic cholecystitis.  EKG showed sinus arrhythmia with low voltage with some ST changes but no change from most recent EKG.  Delta troponin is without change At 22.   Plan was for outpatient follow-up with [facility].  He was given a prescription for Keflex due to possible urinary tract infection  He saw cardiology on 07/24/2022 and reported 2 to 3 weeks of increased dyspnea on exertion with stable chronic intermittent chest pain-they started Imdur 15 mg but had to stop due to headache(s) and low blood pressure  He had consultation with [facility] on 08/07/2022-they reported epigastric pain as an isolated episode following a large meal-they thought that the enlarged gallbladder could be related to CHF instead of chronic cholecystitis-they opted to hold off on  laparoscopic cholecystectomy at present unless recurrent gallbladder issues.  He saw Dr. Phylliss Blakes. A/P: Emergency Department follow up - thankfully no recurrent pain issues and looks like does not have to have cholecystectomy  For weakness/pain working with cardiology to reduce/adjust medications- has upcoming visit with DrLadona Ridgel end of this month so we opted to hold off on adjustments today. If chest pain or shortness of breath worsens though should follow up sooner since didn't tolerate imdur  -just had bloodwork CBC and BMP 07/24/22 and think we can hold off for now  Urinary tract infection appeared to improve with antibiotic(s)- no repeat today   #Heart failure with reduced ejection fraction/ ischemic cardiomypopathy- Dr. Gala Romney #Hypertension  S: Medication:Lasix 40 mg as needed (takes potassium  with Lasix), losartan 25 mg daily, spironolactone 12.5 mg at night -prior Carvedilol 3.125 mg twice  daily,   Edema: trace Weight gain:none Shortness of breath: stable  A/P: heart failure euvolemic/stable- continue current medications. Did not tolerate coreg due to lower blood pressure and HR  it appears Hypertension- reasonable control- continue current medications   #CAD with defibrillator in place/hyperlipidemia #statin myalgia on higher doser #Apical thrombus-on Coumadin through cardiology clinic S: Medication:Aspirin 81 mg, simvastatin 40 mg (half of 40 mg)  -didn't tolerate imdur trial. Did have pain with 20 min walk recently but when he stopped resolved Lab Results  Component Value Date   CHOL 146 09/09/2021   HDL 47.20 09/09/2021   LDLCALC 71 09/09/2021   LDLDIRECT 75.0 06/06/2022   TRIG 136.0 09/09/2021   CHOLHDL 3 09/09/2021   A/P: lipids close to ideal goal on last check- continue current medications at his age Coronary artery disease with what appears to be stable angina and  some shortness of breath - but didn't tolerate imdur  # Diabetes S:  Medication:metformin 500 mg xr CBGs- Home readings have been between 120-150 fasting  Lab Results  Component Value Date   HGBA1C 5.9 (A) 06/02/2022   HGBA1C 6.2 01/30/2022   HGBA1C 6.4 09/09/2021  A/P: has been well controlled- continue current medications   #BPH- on tamsulosin 0.4 mg - considered holding with some orthostatic symptoms but ultimately opted out  Recommended follow up: Return for next already scheduled visit or sooner if needed. Future Appointments  Date Time Provider Department Center  08/21/2022  3:30 PM [facility] ANNUAL WELLNESS VISIT 1 [facility]  08/25/2022  2:20 PM Joylene Grapes, NP [facility] None  09/10/2022  2:30 PM Marinus Maw, MD [facility]  09/17/2022 10:30 AM [facility] COUMADIN CLINIC [facility]  10/16/2022  3:00 PM Shelva Majestic, MD [facility]  10/22/2022  2:40 PM Bensimhon, Bevelyn Buckles, MD [facility] None  10/31/2022  3:35 PM [facility] DEVICE REMOTES [facility]  01/30/2023  3:35 PM [facility] DEVICE REMOTES [facility]  05/01/2023  3:35 PM [facility] DEVICE REMOTES [facility]  07/31/2023  3:35 PM [facility] DEVICE REMOTES [facility]    Lab/Order associations:   ICD-10-CM   1. CAD in native artery  I25.10     2. Chronic systolic heart failure (HCC)  W09.81     3. DM (diabetes mellitus) type II, controlled, with peripheral vascular disorder (HCC)  E11.51     4. Essential hypertension  I10     5. Hyperlipidemia associated with type 2 diabetes mellitus (HCC)  E11.69    E78.5       No orders of the defined types were placed in this encounter.   Return precautions advised.  Tana Conch, MD

## 2022-08-19 NOTE — Progress Notes (Signed)
Remote ICD transmission.   

## 2022-08-21 ENCOUNTER — Ambulatory Visit (INDEPENDENT_AMBULATORY_CARE_PROVIDER_SITE_OTHER): Payer: Medicare Other

## 2022-08-21 VITALS — Wt 214.0 lb

## 2022-08-21 DIAGNOSIS — Z0000 Encounter for general adult medical examination without abnormal findings: Secondary | ICD-10-CM | POA: Diagnosis not present

## 2022-08-21 NOTE — Patient Instructions (Signed)
Joseph Malone , Thank you for taking time to come for your Medicare Wellness Visit. I appreciate your ongoing commitment to your health goals. Please review the following plan we discussed and let me know if I can assist you in the future.   These are the goals we discussed:  Goals       Patient Stated      Stay healthy and stay in good shape      Patient Stated      Stay healthy and exercise       Patient Stated      Patient Stated      Keep blood sugar down and eating healthier       Patient Stated (pt-stated)      Continue to stay healthy         This is a list of the screening recommended for you and due dates:  Health Maintenance  Topic Date Due   COVID-19 Vaccine (7 - 2023-24 season) 08/28/2022*   Flu Shot  09/11/2022   Hemoglobin A1C  12/02/2022   Complete foot exam   01/31/2023   Eye exam for diabetics  06/23/2023   Medicare Annual Wellness Visit  08/21/2023   DTaP/Tdap/Td vaccine (3 - Tdap) 10/30/2029   Pneumonia Vaccine  Completed   Zoster (Shingles) Vaccine  Completed   HPV Vaccine  Aged Out  *Topic was postponed. The date shown is not the original due date.    Advanced directives: Advance directive discussed with you today. Even though you declined this today please call our office should you change your mind and we can give you the proper paperwork for you to fill out.  Conditions/risks identified: stay healthy   Next appointment: Follow up in one year for your annual wellness visit.   Preventive Care 87 Years and Older, Male  Preventive care refers to lifestyle choices and visits with your health care provider that can promote health and wellness. What does preventive care include? A yearly physical exam. This is also called an annual well check. Dental exams once or twice a year. Routine eye exams. Ask your health care provider how often you should have your eyes checked. Personal lifestyle choices, including: Daily care of your teeth and gums. Regular  physical activity. Eating a healthy diet. Avoiding tobacco and drug use. Limiting alcohol use. Practicing safe sex. Taking low doses of aspirin every day. Taking vitamin and mineral supplements as recommended by your health care provider. What happens during an annual well check? The services and screenings done by your health care provider during your annual well check will depend on your age, overall health, lifestyle risk factors, and family history of disease. Counseling  Your health care provider may ask you questions about your: Alcohol use. Tobacco use. Drug use. Emotional well-being. Home and relationship well-being. Sexual activity. Eating habits. History of falls. Memory and ability to understand (cognition). Work and work Astronomer. Screening  You may have the following tests or measurements: Height, weight, and BMI. Blood pressure. Lipid and cholesterol levels. These may be checked every 5 years, or more frequently if you are over 27 years old. Skin check. Lung cancer screening. You may have this screening every year starting at age 87 if you have a 30-pack-year history of smoking and currently smoke or have quit within the past 15 years. Fecal occult blood test (FOBT) of the stool. You may have this test every year starting at age 87. Flexible sigmoidoscopy or colonoscopy. You may have  a sigmoidoscopy every 5 years or a colonoscopy every 10 years starting at age 87. Prostate cancer screening. Recommendations will vary depending on your family history and other risks. Hepatitis C blood test. Hepatitis B blood test. Sexually transmitted disease (STD) testing. Diabetes screening. This is done by checking your blood sugar (glucose) after you have not eaten for a while (fasting). You may have this done every 1-3 years. Abdominal aortic aneurysm (AAA) screening. You may need this if you are a current or former smoker. Osteoporosis. You may be screened starting at age 87  if you are at high risk. Talk with your health care provider about your test results, treatment options, and if necessary, the need for more tests. Vaccines  Your health care provider may recommend certain vaccines, such as: Influenza vaccine. This is recommended every year. Tetanus, diphtheria, and acellular pertussis (Tdap, Td) vaccine. You may need a Td booster every 10 years. Zoster vaccine. You may need this after age 87. Pneumococcal 13-valent conjugate (PCV13) vaccine. One dose is recommended after age 87. Pneumococcal polysaccharide (PPSV23) vaccine. One dose is recommended after age 87. Talk to your health care provider about which screenings and vaccines you need and how often you need them. This information is not intended to replace advice given to you by your health care provider. Make sure you discuss any questions you have with your health care provider. Document Released: 02/23/2015 Document Revised: 10/17/2015 Document Reviewed: 11/28/2014 Elsevier Interactive Patient Education  2017 [employer].  Fall Prevention in the Home Falls can cause injuries. They can happen to people of all ages. There are many things you can do to make your home safe and to help prevent falls. What can I do on the outside of my home? Regularly fix the edges of walkways and driveways and fix any cracks. Remove anything that might make you trip as you walk through a door, such as a raised step or threshold. Trim any bushes or trees on the path to your home. Use bright outdoor lighting. Clear any walking paths of anything that might make someone trip, such as rocks or tools. Regularly check to see if handrails are loose or broken. Make sure that both sides of any steps have handrails. Any raised decks and porches should have guardrails on the edges. Have any leaves, snow, or ice cleared regularly. Use sand or salt on walking paths during winter. Clean up any spills in your garage right away. This  includes oil or grease spills. What can I do in the bathroom? Use night lights. Install grab bars by the toilet and in the tub and shower. Do not use towel bars as grab bars. Use non-skid mats or decals in the tub or shower. If you need to sit down in the shower, use a plastic, non-slip stool. Keep the floor dry. Clean up any water that spills on the floor as soon as it happens. Remove soap buildup in the tub or shower regularly. Attach bath mats securely with double-sided non-slip rug tape. Do not have throw rugs and other things on the floor that can make you trip. What can I do in the bedroom? Use night lights. Make sure that you have a light by your bed that is easy to reach. Do not use any sheets or blankets that are too big for your bed. They should not hang down onto the floor. Have a firm chair that has side arms. You can use this for support while you get dressed. Do  not have throw rugs and other things on the floor that can make you trip. What can I do in the kitchen? Clean up any spills right away. Avoid walking on wet floors. Keep items that you use a lot in easy-to-reach places. If you need to reach something above you, use a strong step stool that has a grab bar. Keep electrical cords out of the way. Do not use floor polish or wax that makes floors slippery. If you must use wax, use non-skid floor wax. Do not have throw rugs and other things on the floor that can make you trip. What can I do with my stairs? Do not leave any items on the stairs. Make sure that there are handrails on both sides of the stairs and use them. Fix handrails that are broken or loose. Make sure that handrails are as long as the stairways. Check any carpeting to make sure that it is firmly attached to the stairs. Fix any carpet that is loose or worn. Avoid having throw rugs at the top or bottom of the stairs. If you do have throw rugs, attach them to the floor with carpet tape. Make sure that you  have a light switch at the top of the stairs and the bottom of the stairs. If you do not have them, ask someone to add them for you. What else can I do to help prevent falls? Wear shoes that: Do not have high heels. Have rubber bottoms. Are comfortable and fit you well. Are closed at the toe. Do not wear sandals. If you use a stepladder: Make sure that it is fully opened. Do not climb a closed stepladder. Make sure that both sides of the stepladder are locked into place. Ask someone to hold it for you, if possible. Clearly mark and make sure that you can see: Any grab bars or handrails. First and last steps. Where the edge of each step is. Use tools that help you move around (mobility aids) if they are needed. These include: Canes. Walkers. Scooters. Crutches. Turn on the lights when you go into a dark area. Replace any light bulbs as soon as they burn out. Set up your furniture so you have a clear path. Avoid moving your furniture around. If any of your floors are uneven, fix them. If there are any pets around you, be aware of where they are. Review your medicines with your doctor. Some medicines can make you feel dizzy. This can increase your chance of falling. Ask your doctor what other things that you can do to help prevent falls. This information is not intended to replace advice given to you by your health care provider. Make sure you discuss any questions you have with your health care provider. Document Released: 11/23/2008 Document Revised: 07/05/2015 Document Reviewed: 03/03/2014 Elsevier Interactive Patient Education  2017 [employer].

## 2022-08-21 NOTE — Progress Notes (Signed)
Subjective:   Joseph Malone is a 89 y.o. male who presents for Medicare Annual/Subsequent preventive examination.  Visit Complete: Virtual  I connected with  Joseph Malone on 08/21/22 by a audio enabled telemedicine application and verified that I am speaking with the correct person using two identifiers.  Patient Location: Home  Provider Location: Office/Clinic  I discussed the limitations of evaluation and management by telemedicine. The patient expressed understanding and agreed to proceed.  Review of Systems     Cardiac Risk Factors include: advanced age (>52men, >75 women);hypertension;diabetes mellitus;dyslipidemia;male gender     Objective:    Today's Vitals   08/21/22 1533  Weight: 214 lb (97.1 kg)   Body mass index is 29.85 kg/m.     08/21/2022    3:43 PM 07/05/2022    7:48 AM 01/16/2022   12:18 PM 01/14/2022    5:00 PM 01/13/2022   10:26 AM 09/06/2021   10:09 AM 09/03/2020    1:16 PM  Advanced Directives  Does Patient Have a Medical Advance Directive? No No No  No No No  Would patient like information on creating a medical advance directive? No - Patient declined  No - Patient declined No - Patient declined  Yes (MAU/Ambulatory/Procedural Areas - Information given) Yes (MAU/Ambulatory/Procedural Areas - Information given)    Current Medications (verified) Outpatient Encounter Medications as of 08/21/2022  Medication Sig   aspirin 81 MG tablet Take 1 tablet (81 mg total) by mouth every evening.   Cholecalciferol (VITAMIN D3) 2000 UNITS TABS Take 2,000 Units by mouth daily.   folic acid (FOLVITE) 400 MCG tablet Take 400 mcg by mouth daily.   Glucosamine HCl 1500 MG TABS Take 1,500 mg by mouth every evening.    Melatonin 5 MG CHEW Chew 1 tablet by mouth daily as needed.   metFORMIN (GLUCOPHAGE-XR) 500 MG 24 hr tablet TAKE 1 TABLET(500 MG) BY MOUTH DAILY WITH BREAKFAST   Multiple Vitamin (MULTIVITAMIN WITH MINERALS) TABS Take 1 tablet by mouth daily.     simvastatin (ZOCOR) 40 MG tablet Take 0.5 tablets (20 mg total) by mouth at bedtime.   spironolactone (ALDACTONE) 25 MG tablet Take 0.5 tablets (12.5 mg total) by mouth daily.   tamsulosin (FLOMAX) 0.4 MG CAPS capsule Take 0.4 mg by mouth at bedtime.   vitamin B-12 (CYANOCOBALAMIN) 100 MCG tablet Take 100 mcg by mouth daily.   warfarin (COUMADIN) 5 MG tablet TAKE 1 TABLET BY MOUTH ON MONDAY, WEDNESDAY, AND FRIDAY, AND TAKE ONE-HALF TABLET ALL OTHER DAYS OR AS DIRECTED (Patient taking differently: TAKE 1 TABLET BY MOUTH DAILY EXCEPT TAKE 1/2 TABLET ON MONDAY AND THURSDAY OR AS DIRECTED BY ANTICOAGULATION CLINIC)   furosemide (LASIX) 40 MG tablet Take 1 tablet (40 mg total) by mouth daily as needed. (Patient not taking: Reported on 08/21/2022)   losartan (COZAAR) 25 MG tablet Take 1 tablet (25 mg total) by mouth daily. (Patient not taking: Reported on 08/21/2022)   nitroGLYCERIN (NITROSTAT) 0.4 MG SL tablet Place 1 tablet (0.4 mg total) under the tongue every 5 (five) minutes as needed for chest pain. 1 tablet every 5 minutes for 3 dose only (Patient not taking: Reported on 08/21/2022)   Rectal Protectant-Emollient (CALMOL-4) 76-10 % SUPP Use as directed once to twice daily (Patient not taking: Reported on 08/21/2022)   No facility-administered encounter medications on file as of 08/21/2022.    Allergies (verified) Penicillins   History: Past Medical History:  Diagnosis Date   Anxiety    BPH (benign  prostatic hypertrophy)    CAD (coronary artery disease)    s/p CABG   CHF (congestive heart failure) (HCC)    Diabetes 1.5, managed as type 2 (HCC)    Diverticulosis    Diverticulosis of colon without hemorrhage 10/22/2007   Qualifier: Diagnosis of  By: Alwyn Ren MD, Chrissie Noa     DOE (dyspnea on exertion)    Excess weight    Fasting hyperglycemia    Generalized weakness    GERD (gastroesophageal reflux disease)    Heart attack (HCC) 2001   Heart failure    CHF due to ischemic CM   History of  kidney stones    Hyperlipidemia    Hypertension    Ischemic cardiomyopathy    Microscopic hematuria    [facility]   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   Past Surgical History:  Procedure Laterality Date   APPENDECTOMY     BIOPSY  10/21/2018   Procedure: BIOPSY;  Surgeon: Benancio Deeds, MD;  Location: [facility];  Service: Gastroenterology;;   CARDIAC CATHETERIZATION  2000   CARDIAC DEFIBRILLATOR PLACEMENT  2005   [employer]; s/p ICD gen change and RV lead revision April 2015   CATARACT EXTRACTION  2008   bilateral with lens implant   COLONOSCOPY  2004   Tics; [facility]   COLONOSCOPY WITH PROPOFOL N/A 01/16/2022   Procedure: COLONOSCOPY WITH PROPOFOL;  Surgeon: Napoleon Form, MD;  Location: [facility];  Service: Gastroenterology;  Laterality: N/A;   CORONARY ARTERY BYPASS GRAFT  2000   1 vessel   CYSTOSCOPY  06/2012   Dr Margarita Grizzle   ESOPHAGOGASTRODUODENOSCOPY (EGD) WITH PROPOFOL N/A 10/21/2018   Procedure: ESOPHAGOGASTRODUODENOSCOPY (EGD) WITH PROPOFOL;  Surgeon: Benancio Deeds, MD;  Location: [facility];  Service: Gastroenterology;  Laterality: N/A;   HOT HEMOSTASIS N/A 10/21/2018   Procedure: HOT HEMOSTASIS (ARGON PLASMA COAGULATION/BICAP);  Surgeon: Benancio Deeds, MD;  Location: [facility];  Service: Gastroenterology;  Laterality: N/A;   IMPLANTABLE CARDIOVERTER DEFIBRILLATOR (ICD) GENERATOR CHANGE N/A 06/01/2013   Procedure: ICD GENERATOR CHANGE;  Surgeon: Marinus Maw, MD;  Location: [facility] CATH LAB;  Service: Cardiovascular;  Laterality: N/A;   LEAD REVISION N/A 06/01/2013   Procedure: LEAD REVISION;  Surgeon: Marinus Maw, MD;  Location: [facility] CATH LAB;  Service: Cardiovascular;  Laterality: N/A;   PILONIDAL CYST EXCISION     POLYPECTOMY  01/16/2022   Procedure: POLYPECTOMY;  Surgeon: Napoleon Form, MD;  Location: [facility];  Service: Gastroenterology;;   RIGHT/LEFT HEART CATH AND CORONARY/GRAFT ANGIOGRAPHY N/A 06/12/2017    Procedure: RIGHT/LEFT HEART CATH AND CORONARY/GRAFT ANGIOGRAPHY;  Surgeon: Dolores Patty, MD;  Location: [facility] INVASIVE CV LAB;  Service: Cardiovascular;  Laterality: N/A;   TRANSTHORACIC ECHOCARDIOGRAM  08/29/2005   Family History  Problem Relation Age of Onset   Hypertension Father    Heart attack Father 5   Diabetes Brother        X 2   Nephrolithiasis Brother    Heart attack Brother 52   Heart attack Paternal Uncle 96   Sudden death Paternal Grandfather 53       ? heat stroke   Lung cancer Sister        "Asian lung cancer"   Stroke Neg Hx    Colon cancer Neg Hx    Esophageal cancer Neg Hx    Rectal cancer Neg Hx    Stomach cancer Neg Hx    Social History   Socioeconomic History   Marital  status: Married    Spouse name: Not on file   Number of children: 4   Years of education: Not on file   Highest education level: Not on file  Occupational History   Occupation: Retired  Tobacco Use   Smoking status: Never    Passive exposure: Yes   Smokeless tobacco: Never  Vaping Use   Vaping status: Never Used  Substance and Sexual Activity   Alcohol use: No   Drug use: No   Sexual activity: Never  Other Topics Concern   Not on file  Social History Narrative   Married 1957. 4 children 2 boys 2 girls. 15 grandkids.  4 greatgrandkids.       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       Hobbies: genealogy, travel      No HCPOA-advised to do this.    Social Determinants of Health   Financial Resource Strain: Low Risk  (08/21/2022)   Overall Financial Resource Strain (CARDIA)    Difficulty of Paying Living Expenses: Not hard at all  Food Insecurity: No Food Insecurity (08/21/2022)   Hunger Vital Sign    Worried About Running Out of Food in the Last Year: Never true    Ran Out of Food in the Last Year: Never true  Transportation Needs: No Transportation Needs (08/21/2022)   PRAPARE - Administrator, Civil Service  (Medical): No    Lack of Transportation (Non-Medical): No  Physical Activity: Insufficiently Active (08/21/2022)   Exercise Vital Sign    Days of Exercise per Week: 2 days    Minutes of Exercise per Session: 50 min  Stress: No Stress Concern Present (08/21/2022)   [employer] of Occupational Health - Occupational Stress Questionnaire    Feeling of Stress : Not at all  Social Connections: Socially Integrated (08/21/2022)   Social Connection and Isolation Panel [NHANES]    Frequency of Communication with Friends and Family: More than three times a week    Frequency of Social Gatherings with Friends and Family: More than three times a week    Attends Religious Services: More than 4 times per year    Active Member of [employer] or Organizations: Yes    Attends Banker Meetings: 1 to 4 times per year    Marital Status: Married    Tobacco Counseling Counseling given: Not Answered   Clinical Intake:  Pre-visit preparation completed: Yes  Pain : No/denies pain     BMI - recorded: 29.85 Nutritional Status: BMI 25 -29 Overweight Nutritional Risks: None Diabetes: Yes CBG done?: Yes (per pt 120) CBG resulted in Enter/ Edit results?: No Did pt. bring in CBG monitor from home?: No  How often do you need to have someone help you when you read instructions, pamphlets, or other written materials from your doctor or pharmacy?: 1 - Never  Interpreter Needed?: No  Information entered by :: Lanier Ensign, LPN   Activities of Daily Living    08/21/2022    3:36 PM 01/14/2022    5:00 PM  In your present state of health, do you have any difficulty performing the following activities:  Hearing? 1 0  Comment wears hearing aids   Vision? 0 0  Difficulty concentrating or making decisions? 0 0  Walking or climbing stairs? 1 0  Comment SOB   Dressing or bathing? 0 0  Doing errands, shopping? 0 0  Preparing Food and eating ? N  Using the Toilet? N   In the past six months,  have you accidently leaked urine? N   Do you have problems with loss of bowel control? N   Managing your Medications? N   Managing your Finances? N   Housekeeping or managing your Housekeeping? N     Patient Care Team: Shelva Majestic, MD as PCP - General (Family Medicine) Marinus Maw, MD as PCP - Electrophysiology (Cardiology) Bensimhon, Bevelyn Buckles, MD as PCP - Advanced Heart Failure (Cardiology) Jerilee Field, MD as Consulting Physician (Urology) Nita Sells, MD (Dermatology) Erroll Luna, [facility] (Inactive) as Pharmacist (Pharmacist)  Indicate any recent Medical Services you may have received from other than [facility] providers in the past year (date may be approximate).     Assessment:   This is a routine wellness examination for [place].  Hearing/Vision screen Hearing Screening - Comments:: Pt has hearing aids  Vision Screening - Comments:: Pt follows up with Dr Laural Benes for annual eye exams   Dietary issues and exercise activities discussed:     Goals Addressed               This Visit's Progress     Patient Stated (pt-stated)        Continue to stay healthy        Depression Screen    08/21/2022    3:41 PM 09/06/2021   10:07 AM 11/23/2020    3:00 PM 09/03/2020    1:12 PM 06/26/2020    2:59 PM 02/28/2020    1:13 PM 08/29/2019    1:16 PM  PHQ 2/9 Scores  PHQ - 2 Score 0 0 0 0 0 0 2  PHQ- 9 Score     0  4    Fall Risk    08/21/2022    3:45 PM 09/06/2021   10:10 AM 11/23/2020    3:00 PM 09/03/2020    1:18 PM 06/26/2020    2:59 PM  Fall Risk   Falls in the past year? 1 0 0 1 1  Number falls in past yr: 1 0 0 1 0  Injury with Fall? 0 0 0 1 1  Comment slid off bed   skinned knees   Risk for fall due to : Impaired vision;Impaired balance/gait Impaired vision;Impaired balance/gait No Fall Risks Impaired balance/gait;Impaired mobility;Impaired vision   Follow up Falls prevention discussed Falls prevention discussed Falls evaluation completed Falls prevention  discussed     MEDICARE RISK AT HOME:  Medicare Risk at Home - 08/21/22 1546     Any stairs in or around the home? No    If so, are there any without handrails? No    Home free of loose throw rugs in walkways, pet beds, electrical cords, etc? Yes    Adequate lighting in your home to reduce risk of falls? Yes    Life alert? No    Use of a cane, walker or w/c? Yes    Grab bars in the bathroom? Yes    Shower chair or bench in shower? No    Elevated toilet seat or a handicapped toilet? No             TIMED UP AND GO:  Was the test performed?  No    Cognitive Function:        08/21/2022    3:47 PM 09/06/2021   10:12 AM 09/03/2020    1:21 PM 08/29/2019    1:28 PM  6CIT Screen  What Year? 0  points 0 points 0 points 0 points  What month? 0 points 0 points 0 points 0 points  What time? 0 points 0 points 0 points 0 points  Count back from 20 0 points 0 points 0 points 0 points  Months in reverse 0 points 0 points 0 points 0 points  Repeat phrase 0 points 2 points 2 points 4 points  Total Score 0 points 2 points 2 points 4 points    Immunizations Immunization History  Administered Date(s) Administered   Fluad Quad(high Dose 65+) 01/20/2020, 11/01/2021   Influenza, High Dose Seasonal PF 10/12/2018, 10/24/2020   Influenza,inj,Quad PF,6+ Mos 11/11/2012   Influenza-Unspecified 10/25/2013, 10/27/2014, 10/24/2015, 10/19/2017   PFIZER(Purple Top)SARS-COV-2 Vaccination 03/06/2019, 03/24/2019, 01/20/2020, 06/27/2020   PNEUMOCOCCAL CONJUGATE-20 09/06/2020   Pfizer Covid-19 Vaccine Bivalent Booster 10yrs & up 12/10/2020, 06/05/2021   Pneumococcal Conjugate-13 08/21/2014   Pneumococcal Polysaccharide-23 10/02/2009   Td 09/17/2009, 10/31/2019   Zoster Recombinant(Shingrix) 06/27/2020, 08/27/2020   Zoster, Live 12/29/2012    TDAP status: Up to date  Flu Vaccine status: Up to date  Pneumococcal vaccine status: Up to date  Covid-19 vaccine status: Completed vaccines  Qualifies  for Shingles Vaccine? Yes   Zostavax completed Yes   Shingrix Completed?: Yes  Screening Tests Health Maintenance  Topic Date Due   COVID-19 Vaccine (7 - 2023-24 season) 08/28/2022 (Originally 10/11/2021)   INFLUENZA VACCINE  09/11/2022   HEMOGLOBIN A1C  12/02/2022   FOOT EXAM  01/31/2023   OPHTHALMOLOGY EXAM  06/23/2023   Medicare Annual Wellness (AWV)  08/21/2023   DTaP/Tdap/Td (3 - Tdap) 10/30/2029   Pneumonia Vaccine 21+ Years old  Completed   Zoster Vaccines- Shingrix  Completed   HPV VACCINES  Aged Out    Health Maintenance  There are no preventive care reminders to display for this patient.   Colorectal cancer screening: No longer required.    Additional Screening:   Vision Screening: Recommended annual ophthalmology exams for early detection of glaucoma and other disorders of the eye. Is the patient up to date with their annual eye exam?  Yes  Who is the provider or what is the name of the office in which the patient attends annual eye exams? Dr Laural Benes  If pt is not established with a provider, would they like to be referred to a provider to establish care? No .   Dental Screening: Recommended annual dental exams for proper oral hygiene    Community Resource Referral / Chronic Care Management: CRR required this visit?  No   CCM required this visit?  No     Plan:     I have personally reviewed and noted the following in the patient's chart:   Medical and social history Use of alcohol, tobacco or illicit drugs  Current medications and supplements including opioid prescriptions. Patient is not currently taking opioid prescriptions. Functional ability and status Nutritional status Physical activity Advanced directives List of other physicians Hospitalizations, surgeries, and ER visits in previous 12 months Vitals Screenings to include cognitive, depression, and falls Referrals and appointments  In addition, I have reviewed and discussed with patient  certain preventive protocols, quality metrics, and best practice recommendations. A written personalized care plan for preventive services as well as general preventive health recommendations were provided to patient.     Marzella Schlein, LPN   [id number]/38/7564   After Visit Summary: (Mail) Due to this being a telephonic visit, the after visit summary with patients personalized plan was offered to patient via mail  Nurse Notes: none

## 2022-08-25 ENCOUNTER — Encounter: Payer: Self-pay | Admitting: Nurse Practitioner

## 2022-08-25 ENCOUNTER — Ambulatory Visit: Payer: Medicare Other | Attending: Nurse Practitioner | Admitting: Nurse Practitioner

## 2022-08-25 VITALS — BP 118/74 | HR 96 | Ht 71.0 in | Wt 219.8 lb

## 2022-08-25 DIAGNOSIS — I513 Intracardiac thrombosis, not elsewhere classified: Secondary | ICD-10-CM

## 2022-08-25 DIAGNOSIS — I10 Essential (primary) hypertension: Secondary | ICD-10-CM | POA: Diagnosis not present

## 2022-08-25 DIAGNOSIS — I25118 Atherosclerotic heart disease of native coronary artery with other forms of angina pectoris: Secondary | ICD-10-CM

## 2022-08-25 DIAGNOSIS — Z7984 Long term (current) use of oral hypoglycemic drugs: Secondary | ICD-10-CM

## 2022-08-25 DIAGNOSIS — I5042 Chronic combined systolic (congestive) and diastolic (congestive) heart failure: Secondary | ICD-10-CM

## 2022-08-25 DIAGNOSIS — E118 Type 2 diabetes mellitus with unspecified complications: Secondary | ICD-10-CM

## 2022-08-25 DIAGNOSIS — E785 Hyperlipidemia, unspecified: Secondary | ICD-10-CM

## 2022-08-25 NOTE — Progress Notes (Signed)
Office Visit    Patient Name: Joseph Malone Date of Encounter: 08/25/2022  Primary Care Provider:  Shelva Majestic, MD Primary Cardiologist:  None  Chief Complaint    89 year old male with a history of CAD s/p CABG x1 (LIMA-LAD), ICM, chronic combined systolic and diastolic heart failure, ICD, LV thrombus, hypertension, hyperlipidemia, and type 2 diabetes who presents for hospital follow-up related to CAD and chest pain.   Past Medical History    Past Medical History:  Diagnosis Date   Anxiety    BPH (benign prostatic hypertrophy)    CAD (coronary artery disease)    s/p CABG   CHF (congestive heart failure) (HCC)    Diabetes 1.5, managed as type 2 (HCC)    Diverticulosis    Diverticulosis of colon without hemorrhage 10/22/2007   Qualifier: Diagnosis of  By: Alwyn Ren MD, Chrissie Noa     DOE (dyspnea on exertion)    Excess weight    Fasting hyperglycemia    Generalized weakness    GERD (gastroesophageal reflux disease)    Heart attack (HCC) 2001   Heart failure    CHF due to ischemic CM   History of kidney stones    Hyperlipidemia    Hypertension    Ischemic cardiomyopathy    Microscopic hematuria    [facility]   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   Past Surgical History:  Procedure Laterality Date   APPENDECTOMY     BIOPSY  10/21/2018   Procedure: BIOPSY;  Surgeon: Benancio Deeds, MD;  Location: [facility];  Service: Gastroenterology;;   CARDIAC CATHETERIZATION  2000   CARDIAC DEFIBRILLATOR PLACEMENT  2005   [employer]; s/p ICD gen change and RV lead revision April 2015   CATARACT EXTRACTION  2008   bilateral with lens implant   COLONOSCOPY  2004   Tics; [facility]   COLONOSCOPY WITH PROPOFOL N/A 01/16/2022   Procedure: COLONOSCOPY WITH PROPOFOL;  Surgeon: Napoleon Form, MD;  Location: [facility];  Service: Gastroenterology;  Laterality: N/A;   CORONARY ARTERY BYPASS GRAFT  2000   1 vessel   CYSTOSCOPY  06/2012   Dr Margarita Grizzle    ESOPHAGOGASTRODUODENOSCOPY (EGD) WITH PROPOFOL N/A 10/21/2018   Procedure: ESOPHAGOGASTRODUODENOSCOPY (EGD) WITH PROPOFOL;  Surgeon: Benancio Deeds, MD;  Location: [facility];  Service: Gastroenterology;  Laterality: N/A;   HOT HEMOSTASIS N/A 10/21/2018   Procedure: HOT HEMOSTASIS (ARGON PLASMA COAGULATION/BICAP);  Surgeon: Benancio Deeds, MD;  Location: [facility];  Service: Gastroenterology;  Laterality: N/A;   IMPLANTABLE CARDIOVERTER DEFIBRILLATOR (ICD) GENERATOR CHANGE N/A 06/01/2013   Procedure: ICD GENERATOR CHANGE;  Surgeon: Marinus Maw, MD;  Location: [facility] CATH LAB;  Service: Cardiovascular;  Laterality: N/A;   LEAD REVISION N/A 06/01/2013   Procedure: LEAD REVISION;  Surgeon: Marinus Maw, MD;  Location: [facility] CATH LAB;  Service: Cardiovascular;  Laterality: N/A;   PILONIDAL CYST EXCISION     POLYPECTOMY  01/16/2022   Procedure: POLYPECTOMY;  Surgeon: Napoleon Form, MD;  Location: [facility];  Service: Gastroenterology;;   RIGHT/LEFT HEART CATH AND CORONARY/GRAFT ANGIOGRAPHY N/A 06/12/2017   Procedure: RIGHT/LEFT HEART CATH AND CORONARY/GRAFT ANGIOGRAPHY;  Surgeon: Dolores Patty, MD;  Location: [facility] INVASIVE CV LAB;  Service: Cardiovascular;  Laterality: N/A;   TRANSTHORACIC ECHOCARDIOGRAM  08/29/2005    Allergies  Allergies  Allergen Reactions   Penicillins Rash and Other (See Comments)    Because of a history of documented adverse serious drug reaction;Medi Alert bracelet  is  recommended Has patient had a PCN reaction causing immediate rash, facial/tongue/throat swelling, SOB or lightheadedness with hypotension: No Has patient had a PCN reaction causing severe rash involving mucus membranes or skin necrosis: No Has patient had a PCN reaction that required hospitalization: No Has patient had a PCN reaction occurring within the last 10 years: No If all of the above answers are "NO", t     Labs/Other Studies Reviewed    The following studies were reviewed  today:  Cardiac Studies & Procedures   CARDIAC CATHETERIZATION  CARDIAC CATHETERIZATION 06/12/2017  Narrative  Ost LAD to Prox LAD lesion is 100% stenosed.  Dist Cx lesion is 20% stenosed.  Mid RCA lesion is 20% stenosed.  Findings:  Ao = 143/77 (105) LV 145/8 RA = 3 RV = 26/4 PA = 27/5 (12) PCW = 9 Fick cardiac output/index = 4.4/2.0 PVR = 0.7 WU FA sat = 97% PA sat = 62%, 63%  Assessment: 1. Left-dominant coronary system with chronically-occluded LAD 2. Non-obstructive CAD elsewhere 3. Patent LIMA to LAD 4. LV gram not performed due to LV thrombus 5. Normal right heart pressure with mildly reduced cardiac output  Plan/Discussion:  Medical therapy.  Arvilla Meres, MD 11:32 AM  Findings Coronary Findings Diagnostic  Dominance: Left  Left Anterior Descending Ost LAD to Prox LAD lesion is 100% stenosed.  Left Circumflex Dist Cx lesion is 20% stenosed.  Right Coronary Artery Mid RCA lesion is 20% stenosed.  LIMA Graft To Mid LAD  Intervention  No interventions have been documented.     ECHOCARDIOGRAM  ECHOCARDIOGRAM COMPLETE 07/25/2022  Narrative ECHOCARDIOGRAM REPORT    Patient Name:   Joseph Malone Date of Exam: 07/25/2022 Medical Rec #:  [id number]      Height:       71.0 in Accession #:    [id number]     Weight:       215.4 lb Date of Birth:  02-06-1931       BSA:          2.176 m Patient Age:    89 years       BP:           102/64 mmHg Patient Gender: M              HR:           84 bpm. Exam Location:  Outpatient  Procedure: 2D Echo, Color Doppler, Cardiac Doppler and Intracardiac Opacification Agent  Indications:    I50.9 Congestive heart Failure  History:        Patient has prior history of Echocardiogram examinations. CHF and Ischemic CM, Previous Myocardial Infarction and CAD, Prior CABG; Risk Factors:Diabetes, Hypertension and Dyslipidemia.  Sonographer:    L. Thornton-Maynard Referring Phys: [id number] [facility]   Sonographer Comments: Technically difficult study due to poor echo windows. IMPRESSIONS   1. Left ventricular ejection fraction, by estimation, is 30 to 35%. The left ventricle has moderately decreased function. The left ventricle demonstrates regional wall motion abnormalities (see scoring diagram/findings for description). There is mild left ventricular hypertrophy. Left ventricular diastolic parameters are indeterminate. 2. Right ventricular systolic function is mildly reduced. The right ventricular size is normal. There is normal pulmonary artery systolic pressure. The estimated right ventricular systolic pressure is 24.7 mmHg. 3. The mitral valve is grossly normal. Trivial mitral valve regurgitation. No evidence of mitral stenosis. 4. The aortic valve is grossly normal. There is mild thickening of the aortic valve. Aortic valve regurgitation  is not visualized. No aortic stenosis is present. 5. The inferior vena cava is normal in size with greater than 50% respiratory variability, suggesting right atrial pressure of 3 mmHg.  FINDINGS Left Ventricle: Left ventricular ejection fraction, by estimation, is 30 to 35%. The left ventricle has moderately decreased function. The left ventricle demonstrates regional wall motion abnormalities. Definity contrast agent was given IV to delineate the left ventricular endocardial borders. The left ventricular internal cavity size was normal in size. There is mild left ventricular hypertrophy. Left ventricular diastolic parameters are indeterminate.   LV Wall Scoring: The mid and distal anterior septum is akinetic. The mid inferoseptal segment, apical anterior segment, and apical inferior segment are hypokinetic.  Right Ventricle: The right ventricular size is normal. Right vetricular wall thickness was not well visualized. Right ventricular systolic function is mildly reduced. There is normal pulmonary artery systolic pressure. The tricuspid  regurgitant velocity is 2.33 m/s, and with an assumed right atrial pressure of 3 mmHg, the estimated right ventricular systolic pressure is 24.7 mmHg.  Left Atrium: Left atrial size was normal in size.  Right Atrium: Right atrial size was normal in size.  Pericardium: Trivial pericardial effusion is present.  Mitral Valve: The mitral valve is grossly normal. Trivial mitral valve regurgitation. No evidence of mitral valve stenosis.  Tricuspid Valve: The tricuspid valve is grossly normal. Tricuspid valve regurgitation is mild . No evidence of tricuspid stenosis.  Aortic Valve: The aortic valve is grossly normal. There is mild thickening of the aortic valve. Aortic valve regurgitation is not visualized. No aortic stenosis is present. Aortic valve mean gradient measures 3.5 mmHg. Aortic valve peak gradient measures 6.4 mmHg. Aortic valve area, by VTI measures 2.22 cm.  Pulmonic Valve: The pulmonic valve was grossly normal. Pulmonic valve regurgitation is trivial. No evidence of pulmonic stenosis.  Aorta: The aortic root is normal in size and structure.  Venous: The inferior vena cava is normal in size with greater than 50% respiratory variability, suggesting right atrial pressure of 3 mmHg.  IAS/Shunts: The atrial septum is grossly normal.  Additional Comments: A device lead is visualized in the right atrium and right ventricle.   LEFT VENTRICLE PLAX 2D                  Diastology LV PW:         1.33 cm   LV e' medial:    4.57 cm/s LV IVS:        1.33 cm   LV E/e' medial:  8.1 LVOT diam:     2.10 cm   LV e' lateral:   9.36 cm/s LV SV:         47        LV E/e' lateral: 3.9 LV SV Index:   22 LVOT Area:     3.46 cm   RIGHT VENTRICLE             IVC RV S prime:     10.60 cm/s  IVC diam: 1.20 cm TAPSE (M-mode): 2.1 cm  LEFT ATRIUM             Index        RIGHT ATRIUM           Index LA Vol (A2C):   41.8 ml 19.21 ml/m  RA Area:     15.30 cm LA Vol (A4C):   38.7 ml 17.78 ml/m   RA Volume:   34.60 ml  15.90 ml/m LA Biplane Vol: 42.9  ml 19.71 ml/m AORTIC VALVE                    PULMONIC VALVE AV Area (Vmax):    2.28 cm     PV Vmax:          0.89 m/s AV Area (Vmean):   2.04 cm     PV Peak grad:     3.2 mmHg AV Area (VTI):     2.22 cm     PR End Diast Vel: 5.66 msec AV Vmax:           126.50 cm/s AV Vmean:          91.500 cm/s AV VTI:            0.212 m AV Peak Grad:      6.4 mmHg AV Mean Grad:      3.5 mmHg LVOT Vmax:         83.20 cm/s LVOT Vmean:        53.900 cm/s LVOT VTI:          0.136 m LVOT/AV VTI ratio: 0.64  AORTA Ao Root diam: 3.40 cm Ao Asc diam:  3.60 cm  MITRAL VALVE               TRICUSPID VALVE MV Area (PHT): 5.66 cm    TR Peak grad:   21.7 mmHg MV Decel Time: 134 msec    TR Vmax:        233.00 cm/s MV E velocity: 36.90 cm/s MV A velocity: 76.60 cm/s  SHUNTS MV E/A ratio:  0.48        Systemic VTI:  0.14 m Systemic Diam: 2.10 cm  Weston Brass MD Electronically signed by Weston Brass MD Signature Date/Time: 07/27/2022/4:41:16 PM    Final    MONITORS  LONG TERM MONITOR (3-14 DAYS) 05/29/2020  Narrative Patch Wear Time:  7 days and 2 hours (2022-04-08T10:21:23-0400 to 2022-04-15T13:05:21-0400)  1. Sinus rhythm - avg HR of 74 bpm. 2. Three runs of nonsustained ventricular tachycardia occurred, the longest lasting 4 beats with an avg rate of 100 bpm. 3. Isolated PACs were frequent (11.3%, 85520) 4. Isolated PVCs were occasional (3.1%, 16109)  Arvilla Meres, MD 4:09 PM          Recent Labs: 07/05/2022: Magnesium 1.8 07/11/2022: ALT 39 07/24/2022: BNP 79.8; BUN 26; Creatinine, Ser 1.27; Hemoglobin 13.0; Platelets 330; Potassium 4.9; Sodium 139  Recent Lipid Panel    Component Value Date/Time   CHOL 146 09/09/2021 1206   TRIG 136.0 09/09/2021 1206   HDL 47.20 09/09/2021 1206   CHOLHDL 3 09/09/2021 1206   VLDL 27.2 09/09/2021 1206   LDLCALC 71 09/09/2021 1206   LDLDIRECT 75.0 06/06/2022 1055    History of  Present Illness    89 year old male with with the above past medical history including CAD s/p CABG x1 (LIMA-LAD), ICM, chronic combined systolic and diastolic heart failure, ICD, LV thrombus, hypertension, hyperlipidemia, and type 2 diabetes.    He is followed in the heart failure clinic.  He was hospitalized in 2001 in the setting of acute myocardial infarction and underwent CABG x 1 (LIMA-LAD).  Most recent cardiac catheterization in May 2019 showed occluded LAD with patent LIMA-LAD, otherwise nonobstructive CAD.  He has a history of ICM.  Echo in 11/2021 showed EF 30 to 35%, mild LVH, G1 DD, akinesis of the mid anteroseptal, anterior, and apical walls, mild mitral valve regurgitation.  He also has a history of LV  thrombus on chronic warfarin.  He has a history of intermittent chest discomfort which has been attributed to muscle strain and or indigestion.  He was hospitalized in December 2023 in the setting of GI bleed.  He was last seen in the heart failure clinic on 02/11/2022 and was stable overall from a cardiac standpoint.  He presented to ED on 07/05/2022 with complaints of chest pain. Troponin was minimally elevated with flat trend.  EKG was unremarkable. Cardiology was consulted.  Outpatient follow-up was recommended.  He was seen in the heart failure clinic on 07/11/2022 and noted increased dizziness, low BP.  Carvedilol was discontinued, losartan was decreased to 25 mg daily.  He returned to the ED on 07/11/2022 in the setting of generalized weakness, fall.  He was treated for possible acute cystitis.  He was last seen by general cardiology on 07/24/2022 and noted increased dyspnea on exertion, chronic intermittent chest pain.  He was started on Imdur 15 mg daily, however, this was later discontinued due to headaches, hypotension.  Most recent echocardiogram in 07/2022 showed EF 30 to 35%, mild LVH, mildly reduced RV systolic function, trivial pericardial effusion, no significant valvular abnormalities.    He presents today for follow-up.  Since his last visit he has stable from a cardiac standpoint. He has stable chronic dyspnea, denies any recent chest pain.  He has stable nonpitting bilateral lower extremity edema, improved with Lasix.  He denies any PND, orthopnea, weight gain.  He has noticed some dark urine, flank pain.  He thinks he may be developing a urinary tract infection or kidney stone (he has a history of both).  He plans to follow-up with his PCP regarding this.  Home Medications    Current Outpatient Medications  Medication Sig Dispense Refill   aspirin 81 MG tablet Take 1 tablet (81 mg total) by mouth every evening.     Cholecalciferol (VITAMIN D3) 2000 UNITS TABS Take 2,000 Units by mouth daily.     folic acid (FOLVITE) 400 MCG tablet Take 400 mcg by mouth daily.     furosemide (LASIX) 40 MG tablet Take 1 tablet (40 mg total) by mouth daily as needed. 30 tablet 3   Glucosamine HCl 1500 MG TABS Take 1,500 mg by mouth every evening.      losartan (COZAAR) 25 MG tablet Take 1 tablet (25 mg total) by mouth daily. 90 tablet 3   Melatonin 5 MG CHEW Chew 1 tablet by mouth daily as needed.     metFORMIN (GLUCOPHAGE-XR) 500 MG 24 hr tablet TAKE 1 TABLET(500 MG) BY MOUTH DAILY WITH BREAKFAST 90 tablet 3   Multiple Vitamin (MULTIVITAMIN WITH MINERALS) TABS Take 1 tablet by mouth daily.      Rectal Protectant-Emollient (CALMOL-4) 76-10 % SUPP Use as directed once to twice daily 6 suppository 0   simvastatin (ZOCOR) 40 MG tablet Take 0.5 tablets (20 mg total) by mouth at bedtime. 45 tablet 3   spironolactone (ALDACTONE) 25 MG tablet Take 0.5 tablets (12.5 mg total) by mouth daily. 90 tablet 3   tamsulosin (FLOMAX) 0.4 MG CAPS capsule Take 0.4 mg by mouth at bedtime.     vitamin B-12 (CYANOCOBALAMIN) 100 MCG tablet Take 100 mcg by mouth daily.     warfarin (COUMADIN) 5 MG tablet TAKE 1 TABLET BY MOUTH ON MONDAY, WEDNESDAY, AND FRIDAY, AND TAKE ONE-HALF TABLET ALL OTHER DAYS OR AS DIRECTED  (Patient taking differently: TAKE 1 TABLET BY MOUTH DAILY EXCEPT TAKE 1/2 TABLET ON MONDAY AND THURSDAY  OR AS DIRECTED BY ANTICOAGULATION CLINIC) 100 tablet 1   nitroGLYCERIN (NITROSTAT) 0.4 MG SL tablet Place 1 tablet (0.4 mg total) under the tongue every 5 (five) minutes as needed for chest pain. 1 tablet every 5 minutes for 3 dose only (Patient not taking: Reported on 08/21/2022) 90 tablet 3   No current facility-administered medications for this visit.     Review of Systems    He denies chest pain, palpitations, pnd, orthopnea, n, v, dizziness, syncope, weight gain, or early satiety. All other systems reviewed and are otherwise negative except as noted above.   Physical Exam    VS:  BP 118/74   Pulse 96   Ht 5\' 11"  (1.803 m)   Wt 219 lb 12.8 oz (99.7 kg)   SpO2 96%   BMI 30.66 kg/m  GEN: Well nourished, well developed, in no acute distress. HEENT: normal. Neck: Supple, no JVD, carotid bruits, or masses. Cardiac: RRR, no murmurs, rubs, or gallops. No clubbing, cyanosis, edema.  Radials/DP/PT 2+ and equal bilaterally.  Respiratory:  Respirations regular and unlabored, clear to auscultation bilaterally. GI: Soft, nontender, nondistended, BS + x 4. MS: no deformity or atrophy. Skin: warm and dry, no rash. Neuro:  Strength and sensation are intact. Psych: Normal affect.  Accessory Clinical Findings    ECG personally reviewed by me today -    - no EKG in office today. Lab Results  Component Value Date   WBC 7.2 07/24/2022   HGB 13.0 07/24/2022   HCT 38.3 07/24/2022   MCV 90 07/24/2022   PLT 330 07/24/2022   Lab Results  Component Value Date   CREATININE 1.27 07/24/2022   BUN 26 07/24/2022   NA 139 07/24/2022   K 4.9 07/24/2022   CL 104 07/24/2022   CO2 21 07/24/2022   Lab Results  Component Value Date   ALT 39 07/11/2022   AST 31 07/11/2022   ALKPHOS 63 07/11/2022   BILITOT 0.6 07/11/2022   Lab Results  Component Value Date   CHOL 146 09/09/2021   HDL 47.20  09/09/2021   LDLCALC 71 09/09/2021   LDLDIRECT 75.0 06/06/2022   TRIG 136.0 09/09/2021   CHOLHDL 3 09/09/2021    Lab Results  Component Value Date   HGBA1C 5.9 (A) 06/02/2022    Assessment & Plan    1. CAD/chest pain: S/p CABG x 1 in 2001.  He has stable chronic dyspnea on exertion, overall unchanged.  He denies any recent chest pain.  His symptoms are stable overall.  He did not tolerate Imdur due to headache and hypotension.  Patient declines any additional testing at this point in time.  Reviewed ED precautions.  Continue to monitor symptoms.  Continue aspirin, losartan, spironolactone, and simvastatin.    2. ICM/chronic combined systolic and diastolic heart failure: S/p ICD.  Most recent device check in 07/2022 showed normal device function.  Most recent echocardiogram in 07/2022 showed EF 30 to 35%, mild LVH, mildly reduced RV systolic function, trivial pericardial effusion, no significant valvular abnormalities. He has stable chronic dyspnea.  He reports stable nonpitting bilateral lower extremity edema, controlled with prn Lasix. Generally euvolemic and well compensated on exam.  Carvedilol was previously discontinued in the setting of dizziness, hypotension.  No SGLT2 given history of UTIs. Continue losartan, spironolactone, Lasix.     3. LV thrombus: On Coumadin.  Most recent echo as above.   4. Hypertension: He has had somewhat labile BP.  Recent hypotension.  His PCP advised him to  hold his losartan for SBP less than 110.  Continue current antihypertensive regimen.    5. Hyperlipidemia: LDL was 71 in 08/2021.  Continue simvastatin.   6. Type 2 diabetes: A1c was 5.9 in 05/2022.  Monitored and managed per PCP.   7. Disposition: Follow-up as scheduled with Dr. Ladona Ridgel in 08/2022, follow-up as scheduled with Dr. Gala Romney in 10/2022.       Joylene Grapes, NP 08/25/2022, 4:22 PM

## 2022-08-25 NOTE — Patient Instructions (Signed)
Medication Instructions:  Your physician recommends that you continue on your current medications as directed. Please refer to the Current Medication list given to you today.  *If you need a refill on your cardiac medications before your next appointment, please call your pharmacy*   Lab Work: NONE ordered at this time of appointment   Testing/Procedures: NONE ordered at this time of appointment     Follow-Up: At [facility], you and your health needs are our priority.  As part of our continuing mission to provide you with exceptional heart care, we have created designated Provider Care Teams.  These Care Teams include your primary Cardiologist (physician) and Advanced Practice Providers (APPs -  Physician Assistants and Nurse Practitioners) who all work together to provide you with the care you need, when you need it.  We recommend signing up for the patient portal called "MyChart".  Sign up information is provided on this After Visit Summary.  MyChart is used to connect with patients for Virtual Visits (Telemedicine).  Patients are able to view lab/test results, encounter notes, upcoming appointments, etc.  Non-urgent messages can be sent to your provider as well.   To learn more about what you can do with MyChart, go to [email or web address].    Your next appointment:    As needed   Provider:   Any APP

## 2022-09-05 DIAGNOSIS — I110 Hypertensive heart disease with heart failure: Secondary | ICD-10-CM | POA: Diagnosis not present

## 2022-09-05 DIAGNOSIS — I2510 Atherosclerotic heart disease of native coronary artery without angina pectoris: Secondary | ICD-10-CM | POA: Diagnosis not present

## 2022-09-10 ENCOUNTER — Encounter: Payer: Self-pay | Admitting: Internal Medicine

## 2022-09-10 ENCOUNTER — Ambulatory Visit: Payer: Medicare Other | Attending: Internal Medicine | Admitting: Internal Medicine

## 2022-09-10 VITALS — BP 106/60 | HR 98 | Ht 71.0 in | Wt 221.6 lb

## 2022-09-10 DIAGNOSIS — I5022 Chronic systolic (congestive) heart failure: Secondary | ICD-10-CM

## 2022-09-10 NOTE — Patient Instructions (Signed)
Medication Instructions:  Your physician recommends that you continue on your current medications as directed. Please refer to the Current Medication list given to you today.  *If you need a refill on your cardiac medications before your next appointment, please call your pharmacy*  Lab Work: None ordered today.  Testing/Procedures: None ordered today.  Follow-Up: At [facility], you and your health needs are our priority.  As part of our continuing mission to provide you with exceptional heart care, we have created designated Provider Care Teams.  These Care Teams include your primary Cardiologist (physician) and Advanced Practice Providers (APPs -  Physician Assistants and Nurse Practitioners) who all work together to provide you with the care you need, when you need it.  Your next appointment:   1 year(s)  The format for your next appointment:   In Person  Provider:   You may see Lewayne Bunting, MD or one of the following Advanced Practice Providers on your designated Care Team:   Francis Dowse, [place] [address] [place], [place] Sherie Don, NP Canary Brim, NP{

## 2022-09-10 NOTE — Progress Notes (Signed)
HPI Joseph Malone returns today for followup. He is a pleasant 89 yo man with chronic systolic heart failure, s/p CABG, prior remote anterior MI, EF 35%. He is s/p ICD insertion for primary prevention remotely. He has lost almost 30 lbs in the past. Non- cardiac chest pain thought associate with biliary  disease. No ICD therapies. Allergies  Allergen Reactions   Penicillins Rash and Other (See Comments)    Because of a history of documented adverse serious drug reaction;Medi Alert bracelet  is recommended Has patient had a PCN reaction causing immediate rash, facial/tongue/throat swelling, SOB or lightheadedness with hypotension: No Has patient had a PCN reaction causing severe rash involving mucus membranes or skin necrosis: No Has patient had a PCN reaction that required hospitalization: No Has patient had a PCN reaction occurring within the last 10 years: No If all of the above answers are "NO", t     Current Outpatient Medications  Medication Sig Dispense Refill   aspirin 81 MG tablet Take 1 tablet (81 mg total) by mouth every evening.     Cholecalciferol (VITAMIN D3) 2000 UNITS TABS Take 2,000 Units by mouth daily.     folic acid (FOLVITE) 400 MCG tablet Take 400 mcg by mouth daily.     furosemide (LASIX) 40 MG tablet Take 1 tablet (40 mg total) by mouth daily as needed. 30 tablet 3   Glucosamine HCl 1500 MG TABS Take 1,500 mg by mouth every evening.      losartan (COZAAR) 25 MG tablet Take 1 tablet (25 mg total) by mouth daily. 90 tablet 3   Melatonin 5 MG CHEW Chew 1 tablet by mouth daily as needed.     metFORMIN (GLUCOPHAGE-XR) 500 MG 24 hr tablet TAKE 1 TABLET(500 MG) BY MOUTH DAILY WITH BREAKFAST 90 tablet 3   Multiple Vitamin (MULTIVITAMIN WITH MINERALS) TABS Take 1 tablet by mouth daily.      nitroGLYCERIN (NITROSTAT) 0.4 MG SL tablet Place 1 tablet (0.4 mg total) under the tongue every 5 (five) minutes as needed for chest pain. 1 tablet every 5 minutes for 3 dose only 90  tablet 3   Rectal Protectant-Emollient (CALMOL-4) 76-10 % SUPP Use as directed once to twice daily 6 suppository 0   simvastatin (ZOCOR) 40 MG tablet Take 0.5 tablets (20 mg total) by mouth at bedtime. 45 tablet 3   spironolactone (ALDACTONE) 25 MG tablet Take 0.5 tablets (12.5 mg total) by mouth daily. 90 tablet 3   tamsulosin (FLOMAX) 0.4 MG CAPS capsule Take 0.4 mg by mouth at bedtime.     vitamin B-12 (CYANOCOBALAMIN) 100 MCG tablet Take 100 mcg by mouth daily.     warfarin (COUMADIN) 5 MG tablet TAKE 1 TABLET BY MOUTH ON MONDAY, WEDNESDAY, AND FRIDAY, AND TAKE ONE-HALF TABLET ALL OTHER DAYS OR AS DIRECTED (Patient taking differently: TAKE 1 TABLET BY MOUTH DAILY EXCEPT TAKE 1/2 TABLET ON MONDAY AND THURSDAY OR AS DIRECTED BY ANTICOAGULATION CLINIC) 100 tablet 1   No current facility-administered medications for this visit.     Past Medical History:  Diagnosis Date   Anxiety    BPH (benign prostatic hypertrophy)    CAD (coronary artery disease)    s/p CABG   CHF (congestive heart failure) (HCC)    Diabetes 1.5, managed as type 2 (HCC)    Diverticulosis    Diverticulosis of colon without hemorrhage 10/22/2007   Qualifier: Diagnosis of  By: Alwyn Ren MD, Chrissie Noa     DOE (dyspnea on exertion)  Excess weight    Fasting hyperglycemia    Generalized weakness    GERD (gastroesophageal reflux disease)    Heart attack (HCC) 2001   Heart failure    CHF due to ischemic CM   History of kidney stones    Hyperlipidemia    Hypertension    Ischemic cardiomyopathy    Microscopic hematuria    [facility]   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   Past Surgical History:  Procedure Laterality Date   APPENDECTOMY     BIOPSY  10/21/2018   Procedure: BIOPSY;  Surgeon: Benancio Deeds, MD;  Location: [facility];  Service: Gastroenterology;;   CARDIAC CATHETERIZATION  2000   CARDIAC DEFIBRILLATOR  PLACEMENT  2005   Medtronic; s/p ICD gen change and RV lead revision April 2015   CATARACT EXTRACTION  2008   bilateral with lens implant   COLONOSCOPY  2004   Tics; [facility]   COLONOSCOPY WITH PROPOFOL N/A 01/16/2022   Procedure: COLONOSCOPY WITH PROPOFOL;  Surgeon: Napoleon Form, MD;  Location: [facility];  Service: Gastroenterology;  Laterality: N/A;   CORONARY ARTERY BYPASS GRAFT  2000   1 vessel   CYSTOSCOPY  06/2012   Dr Margarita Grizzle   ESOPHAGOGASTRODUODENOSCOPY (EGD) WITH PROPOFOL N/A 10/21/2018   Procedure: ESOPHAGOGASTRODUODENOSCOPY (EGD) WITH PROPOFOL;  Surgeon: Benancio Deeds, MD;  Location: [facility];  Service: Gastroenterology;  Laterality: N/A;   HOT HEMOSTASIS N/A 10/21/2018   Procedure: HOT HEMOSTASIS (ARGON PLASMA COAGULATION/BICAP);  Surgeon: Benancio Deeds, MD;  Location: [facility];  Service: Gastroenterology;  Laterality: N/A;   IMPLANTABLE CARDIOVERTER DEFIBRILLATOR (ICD) GENERATOR CHANGE N/A 06/01/2013   Procedure: ICD GENERATOR CHANGE;  Surgeon: Marinus Maw, MD;  Location: [facility] CATH LAB;  Service: Cardiovascular;  Laterality: N/A;   LEAD REVISION N/A 06/01/2013   Procedure: LEAD REVISION;  Surgeon: Marinus Maw, MD;  Location[facility] LAB;  Service: Cardiovascular;  Laterality: N/A;   PILONIDAL CYST EXCISION     POLYPECTOMY  01/16/2022   Procedure: POLYPECTOMY;  Surgeon: Napoleon Form, MD;  Location: [facility];  Service: Gastroenterology;;   RIGHT/LEFT HEART CATH AND CORONARY/GRAFT ANGIOGRAPHY N/A 06/12/2017   Procedure: RIGHT/LEFT HEART CATH AND CORONARY/GRAFT ANGIOGRAPHY;  Surgeon: Dolores Patty, MD;  Location: [facility] CV [facility];  Service: Cardiovascular;  Laterality: N/A;   TRANSTHORACIC ECHOCARDIOGRAM  08/29/2005     Family History  Problem Relation Age of Onset   Hypertension Father    Heart attack Father 34   Diabetes Brother        X 2   Nephrolithiasis Brother    Heart attack Brother 62   Heart attack Paternal Uncle  48   Sudden death Paternal Grandfather 8       ? heat stroke   Lung cancer Sister        "Asian lung cancer"   Stroke Neg Hx    Colon cancer Neg Hx    Esophageal cancer Neg Hx    Rectal cancer Neg Hx    Stomach cancer Neg Hx      Social History   Socioeconomic History   Marital status: Married    Spouse name: Not on file   Number of children: 4   Years of education: Not on file   Highest education level: Not on file  Occupational History   Occupation: Retired  Tobacco Use  Smoking status: Never    Passive exposure: Yes   Smokeless tobacco: Never  Vaping Use   Vaping status: Never Used  Substance and Sexual Activity   Alcohol use: No   Drug use: No   Sexual activity: Never  Other Topics Concern   Not on file  Social History Narrative   Married 1957. 4 children 2 boys 2 girls. 15 grandkids.  4 greatgrandkids.       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       Hobbies: genealogy, travel      No HCPOA-advised to do this.    Social Determinants of Health   Financial Resource Strain: Low Risk  (08/21/2022)   Overall Financial Resource Strain (CARDIA)    Difficulty of Paying Living Expenses: Not hard at all  Food Insecurity: No Food Insecurity (08/21/2022)   Hunger Vital Sign    Worried About Running Out of Food in the Last Year: Never true    Ran Out of Food in the Last Year: Never true  Transportation Needs: No Transportation Needs (08/21/2022)   PRAPARE - Administrator, Civil Service (Medical): No    Lack of Transportation (Non-Medical): No  Physical Activity: Insufficiently Active (08/21/2022)   Exercise Vital Sign    Days of Exercise per Week: 2 days    Minutes of Exercise per Session: 50 min  Stress: No Stress Concern Present (08/21/2022)   [employer] of Occupational Health - Occupational Stress Questionnaire    Feeling of Stress : Not at all  Social Connections: Socially Integrated  (08/21/2022)   Social Connection and Isolation Panel [NHANES]    Frequency of Communication with Friends and Family: More than three times a week    Frequency of Social Gatherings with Friends and Family: More than three times a week    Attends Religious Services: More than 4 times per year    Active Member of [employer] or Organizations: Yes    Attends Banker Meetings: 1 to 4 times per year    Marital Status: Married  Catering manager Violence: Not At Risk (08/21/2022)   Humiliation, Afraid, Rape, and Kick questionnaire    Fear of Current or Ex-Partner: No    Emotionally Abused: No    Physically Abused: No    Sexually Abused: No     BP 106/60   Pulse 98   Ht 5\' 11"  (1.803 m)   Wt 221 lb 9.6 oz (100.5 kg)   SpO2 98%   BMI 30.91 kg/m   Physical Exam:  Well appearing NAD HEENT: Unremarkable Neck:  No JVD, no thyromegally Lymphatics:  No adenopathy Back:  No CVA tenderness Lungs:  Clear with no wheezes HEART:  Regular rate rhythm, no murmurs, no rubs, no clicks Abd:  soft, positive bowel sounds, no organomegally, no rebound, no guarding Ext:  2 plus pulses, no edema, no cyanosis, no clubbing Skin:  No rashes no nodules Neuro:  CN II through XII intact, motor grossly intact  DEVICE  Normal device function.  See PaceArt for details.   Assess/Plan:  1. Chronic systolic heart failure - his symptoms have improved.Marland Kitchen He will continue his current meds. 2. ICD - his medtronic single chamber ICD is working normally. We will recheck in several months. 3. CAD - he denies anginal symptoms. No change in meds except as noted above. 4. Dyslipidemia - he will continue his statin therapy.   Sharlot Gowda Harsh Trulock,MD

## 2022-09-17 ENCOUNTER — Ambulatory Visit (INDEPENDENT_AMBULATORY_CARE_PROVIDER_SITE_OTHER): Payer: Medicare Other

## 2022-09-17 DIAGNOSIS — Z7901 Long term (current) use of anticoagulants: Secondary | ICD-10-CM | POA: Diagnosis not present

## 2022-09-17 LAB — POCT INR: INR: 1.8 — AB (ref 2.0–3.0)

## 2022-09-17 NOTE — Patient Instructions (Addendum)
Pre visit review using our clinic review tool, if applicable. No additional management support is needed unless otherwise documented below in the visit note.  Increase dose today to take 1 1/2 tablets and then continue 1 tablet daily except 1/2 tablet on Mondays and Thursdays.  Recheck in 5 weeks.

## 2022-09-17 NOTE — Progress Notes (Signed)
Pt reports missing 1/2 tablet. Increase dose today to take 1 1/2 tablets and then continue 1 tablet daily except 1/2 tablet on Mondays and Thursdays.  Recheck in 5 weeks per pt request.

## 2022-09-17 NOTE — Progress Notes (Signed)
I have reviewed and agree with note, evaluation, plan.    , MD  

## 2022-09-19 ENCOUNTER — Telehealth: Payer: Self-pay | Admitting: Family Medicine

## 2022-09-19 NOTE — Telephone Encounter (Signed)
Spoke to pt told him per Dr. Durene Cal, As they do not want to do surgery unless absolutely necessary would advise going back to avoiding fatty/greasy foods. They were also worried about possible heart issues but looks like he has seen cardiology since tha ttime- if new shortness of breath or chest pain should seek care. Pt verbalized understanding and said he called the surgeon. They told him to take 2 Tylenol, stool softner and other laxative he has for his bowels. Pt said not sure if he should take the medicine he takes at night. Asked pt is he is constipated? Pt said yes. Told him then I would take the medication as they told you too. Pt verbalized understanding and said he is not having chest pain. Told him okay.

## 2022-09-19 NOTE — Telephone Encounter (Signed)
Patient states he spoke with PCP about a stomach issue he had that was believed to be gallbladder related. States pcp informed him to stay away from greasy foods but a couple days ago he ate fried chicken and sloppy joe. Patient states he has slight abdominal pain but nothing major. He requests recommendations from PCP.    I informed pt in the meantime to reach out to [facility] for recommendations as well since they did a full work up on him for this.

## 2022-09-19 NOTE — Telephone Encounter (Signed)
As they do not want to do surgery unless absolutely necessary would advise going back to avoiding fatty/greasy foods. They were also worried about possible heart issues but looks like he has seen cardiology since tha ttime- if new shortness of breath or chest pain should seek care

## 2022-09-19 NOTE — Telephone Encounter (Signed)
Please see message and advise 

## 2022-10-16 ENCOUNTER — Ambulatory Visit (INDEPENDENT_AMBULATORY_CARE_PROVIDER_SITE_OTHER): Payer: Medicare Other | Admitting: Family Medicine

## 2022-10-16 ENCOUNTER — Other Ambulatory Visit: Payer: Self-pay | Admitting: Family Medicine

## 2022-10-16 ENCOUNTER — Encounter: Payer: Self-pay | Admitting: Family Medicine

## 2022-10-16 VITALS — BP 118/76 | HR 90 | Temp 98.0°F | Ht 71.0 in | Wt 220.8 lb

## 2022-10-16 DIAGNOSIS — E1169 Type 2 diabetes mellitus with other specified complication: Secondary | ICD-10-CM

## 2022-10-16 DIAGNOSIS — E785 Hyperlipidemia, unspecified: Secondary | ICD-10-CM

## 2022-10-16 DIAGNOSIS — I5022 Chronic systolic (congestive) heart failure: Secondary | ICD-10-CM

## 2022-10-16 DIAGNOSIS — Z7984 Long term (current) use of oral hypoglycemic drugs: Secondary | ICD-10-CM

## 2022-10-16 DIAGNOSIS — I10 Essential (primary) hypertension: Secondary | ICD-10-CM | POA: Diagnosis not present

## 2022-10-16 DIAGNOSIS — Z0000 Encounter for general adult medical examination without abnormal findings: Secondary | ICD-10-CM

## 2022-10-16 DIAGNOSIS — E1151 Type 2 diabetes mellitus with diabetic peripheral angiopathy without gangrene: Secondary | ICD-10-CM

## 2022-10-16 DIAGNOSIS — Z79899 Other long term (current) drug therapy: Secondary | ICD-10-CM | POA: Diagnosis not present

## 2022-10-16 NOTE — Progress Notes (Signed)
Phone: [phone]   Subjective:  Patient presents today for their annual physical. Chief complaint-noted.   See problem oriented charting- ROS- full  review of systems was completed and negative  except for: ongoing weakness and notes blood pressure runs on low side  The following were reviewed and entered/updated in epic: Past Medical History:  Diagnosis Date   Anxiety    BPH (benign prostatic hypertrophy)    CAD (coronary artery disease)    s/p CABG   CHF (congestive heart failure) (HCC)    Diabetes 1.5, managed as type 2 (HCC)    Diverticulosis    Diverticulosis of colon without hemorrhage 10/22/2007   Qualifier: Diagnosis of  By: Alwyn Ren MD, Chrissie Noa     DOE (dyspnea on exertion)    Excess weight    Fasting hyperglycemia    Generalized weakness    GERD (gastroesophageal reflux disease)    Heart attack (HCC) 2001   Heart failure    CHF due to ischemic CM   History of kidney stones    Hyperlipidemia    Hypertension    Ischemic cardiomyopathy    Microscopic hematuria    [facility]   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   Patient Active Problem List   Diagnosis Date Noted   GI bleeding 01/13/2022    Priority: High   Apical mural thrombus 01/13/2022    Priority: High   Chronic systolic heart failure (HCC) 12/09/2010    Priority: High   Cardiomyopathy, ischemic 10/02/2009    Priority: High   Automatic implantable cardioverter-defibrillator in situ 01/16/2009    Priority: High   DM (diabetes mellitus) type II, controlled, with peripheral vascular disorder (HCC) 09/14/2008    Priority: High   CAD in native artery 09/25/2006    Priority: High   Atherosclerosis of aorta (HCC) 05/18/2020    Priority: Medium    Nephrolithiasis 03/24/2016    Priority: Medium    Hyperlipidemia associated with type 2 diabetes mellitus (HCC) 04/22/2007    Priority: Medium    Essential hypertension 09/25/2006    Priority: Medium    Long term (current) use of  anticoagulants 07/08/2017    Priority: Low   Erectile dysfunction 01/19/2014    Priority: Low   Basal cell cancer 09/29/2012    Priority: Low   Solitary pulmonary nodule 07/12/2012    Priority: Low   BPH without obstruction/lower urinary tract symptoms 10/22/2007    Priority: Low   Osteoarthritis 09/25/2006    Priority: Low   Hematuria 09/25/2006    Priority: Low   Polyp of ascending colon 01/16/2022   Acute GI bleeding 01/14/2022   Chronic combined systolic and diastolic congestive heart failure (HCC) 01/13/2022   PVC's (premature ventricular contractions) 01/24/2020   Hematochezia    Past Surgical History:  Procedure Laterality Date   APPENDECTOMY     BIOPSY  10/21/2018   Procedure: BIOPSY;  Surgeon: Benancio Deeds, MD;  Location: [facility];  Service: Gastroenterology;;   CARDIAC CATHETERIZATION  2000   CARDIAC DEFIBRILLATOR PLACEMENT  2005   [employer]; s/p ICD gen change and RV lead revision April 2015   CATARACT EXTRACTION  2008   bilateral with lens implant   COLONOSCOPY  2004   Tics; [facility]   COLONOSCOPY WITH PROPOFOL N/A 01/16/2022   Procedure: COLONOSCOPY WITH PROPOFOL;  Surgeon: Napoleon Form, MD;  Location: [facility];  Service: Gastroenterology;  Laterality: N/A;   CORONARY ARTERY BYPASS GRAFT  2000   1 vessel  CYSTOSCOPY  06/2012   Dr Margarita Grizzle   ESOPHAGOGASTRODUODENOSCOPY (EGD) WITH PROPOFOL N/A 10/21/2018   Procedure: ESOPHAGOGASTRODUODENOSCOPY (EGD) WITH PROPOFOL;  Surgeon: Benancio Deeds, MD;  Location: [facility];  Service: Gastroenterology;  Laterality: N/A;   HOT HEMOSTASIS N/A 10/21/2018   Procedure: HOT HEMOSTASIS (ARGON PLASMA COAGULATION/BICAP);  Surgeon: Benancio Deeds, MD;  Location: [facility];  Service: Gastroenterology;  Laterality: N/A;   IMPLANTABLE CARDIOVERTER DEFIBRILLATOR (ICD) GENERATOR CHANGE N/A 06/01/2013   Procedure: ICD GENERATOR CHANGE;  Surgeon: Marinus Maw, MD;  Location: [facility] CATH LAB;  Service:  Cardiovascular;  Laterality: N/A;   LEAD REVISION N/A 06/01/2013   Procedure: LEAD REVISION;  Surgeon: Marinus Maw, MD;  Location: [facility] C[facility] LAB;  Service: Cardiovascular;  Laterality: N/A;   PILONIDAL CYST EXCISION     POLYPECTOMY  01/16/2022   Procedure: POLYPECTOMY;  Surgeon: Napoleon Form, MD;  Location: [facility];  Service: Gastroenterology;;   RIGHT/LEFT HEART CATH AND CORONARY/GRAFT ANGIOGRAPHY N/A 06/12/2017   Procedure: RIGHT/LEFT HEART CATH AND CORONARY/GRAFT ANGIOGRAPHY;  Surgeon: Dolores Patty, MD;  Location: [facility];  Service: Cardiovascular;  Laterality: N/A;   TRANSTHORACIC ECHOCARDIOGRAM  08/29/2005    Family History  Problem Relation Age of Onset   Hypertension Father    Heart attack Father 54   Diabetes Brother        X 2   Nephrolithiasis Brother    Heart attack Brother 18   Heart attack Paternal Uncle 56   Sudden death Paternal Grandfather 43       ? heat stroke   Lung cancer Sister        "Asian lung cancer"   Stroke Neg Hx    Colon cancer Neg Hx    Esophageal cancer Neg Hx    Rectal cancer Neg Hx    Stomach cancer Neg Hx     Medications- reviewed and updated Current Outpatient Medications  Medication Sig Dispense Refill   aspirin 81 MG tablet Take 1 tablet (81 mg total) by mouth every evening.     Cholecalciferol (VITAMIN D3) 2000 UNITS TABS Take 2,000 Units by mouth daily.     folic acid (FOLVITE) 400 MCG tablet Take 400 mcg by mouth daily.     furosemide (LASIX) 40 MG tablet Take 1 tablet (40 mg total) by mouth daily as needed. 30 tablet 3   Glucosamine HCl 1500 MG TABS Take 1,500 mg by mouth every evening.      Melatonin 5 MG CHEW Chew 1 tablet by mouth daily as needed.     metFORMIN (GLUCOPHAGE-XR) 500 MG 24 hr tablet TAKE 1 TABLET(500 MG) BY MOUTH DAILY WITH BREAKFAST 90 tablet 3   Multiple Vitamin (MULTIVITAMIN WITH MINERALS) TABS Take 1 tablet by mouth daily.      Rectal Protectant-Emollient (CALMOL-4) 76-10 % SUPP Use as  directed once to twice daily 6 suppository 0   simvastatin (ZOCOR) 40 MG tablet Take 0.5 tablets (20 mg total) by mouth at bedtime. 45 tablet 3   spironolactone (ALDACTONE) 25 MG tablet Take 0.5 tablets (12.5 mg total) by mouth daily. 90 tablet 3   tamsulosin (FLOMAX) 0.4 MG CAPS capsule Take 0.4 mg by mouth at bedtime.     vitamin B-12 (CYANOCOBALAMIN) 100 MCG tablet Take 100 mcg by mouth daily.     warfarin (COUMADIN) 5 MG tablet TAKE 1 TABLET BY MOUTH ON MONDAY, WEDNESDAY, AND FRIDAY, AND TAKE ONE-HALF TABLET ALL OTHER DAYS OR AS DIRECTED (Patient taking differently: TAKE 1  TABLET BY MOUTH DAILY EXCEPT TAKE 1/2 TABLET ON MONDAY AND THURSDAY OR AS DIRECTED BY ANTICOAGULATION CLINIC) 100 tablet 1   losartan (COZAAR) 25 MG tablet Take 1 tablet (25 mg total) by mouth daily. 90 tablet 3   nitroGLYCERIN (NITROSTAT) 0.4 MG SL tablet Place 1 tablet (0.4 mg total) under the tongue every 5 (five) minutes as needed for chest pain. 1 tablet every 5 minutes for 3 dose only (Patient not taking: Reported on 10/16/2022) 90 tablet 3   No current facility-administered medications for this visit.    Allergies-reviewed and updated Allergies  Allergen Reactions   Penicillins Rash and Other (See Comments)    Because of a history of documented adverse serious drug reaction;Medi Alert bracelet  is recommended Has patient had a PCN reaction causing immediate rash, facial/tongue/throat swelling, SOB or lightheadedness with hypotension: No Has patient had a PCN reaction causing severe rash involving mucus membranes or skin necrosis: No Has patient had a PCN reaction that required hospitalization: No Has patient had a PCN reaction occurring within the last 10 years: No If all of the above answers are "NO", t    Social History   Social History Narrative   Married 1957. 4 children 2 boys 2 girls. 15 grandkids.  4 greatgrandkids.       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       Hobbies: genealogy, travel      No HCPOA-advised to do this.    Objective  Objective:  BP 118/76   Pulse 90   Temp 98 F (36.7 C)   Ht 5\' 11"  (1.803 m)   Wt 220 lb 12.8 oz (100.2 kg)   SpO2 96%   BMI 30.80 kg/m  Gen: NAD, resting comfortably HEENT: Mucous membranes are moist. Oropharynx normal Neck: no thyromegaly CV: RRR no murmurs rubs or gallops Lungs: CTAB no crackles, wheeze, rhonchi Abdomen: soft/nontender/nondistended/normal bowel sounds. No rebound or guarding.  Ext: trace edema at least Skin: warm, dry Neuro: grossly normal, moves all extremities, PERRLA    Assessment and Plan  89 y.o. male presenting for annual physical.  Health Maintenance counseling: 1. Anticipatory guidance: Patient counseled regarding regular dental exams -q6 months, eye exams -yearly,  avoiding smoking and second hand smoke , limiting alcohol to 2 beverages per day - no drinking, no illicit drugs .   2. Risk factor reduction:  Advised patient of need for regular exercise and diet rich and fruits and vegetables to reduce risk of heart attack and stroke.  Exercise- 2 days this week already and tries to go more if not feeling weak.  Diet/weight management-weight down 7 pounds in the last year from physical and was down 6 pounds at that time.  Wt Readings from Last 3 Encounters:  10/16/22 220 lb 12.8 oz (100.2 kg)  09/10/22 221 lb 9.6 oz (100.5 kg)  08/25/22 219 lb 12.8 oz (99.7 kg)  3. Immunizations/screenings/ancillary studies-has received COVID and flu shot and otherwise up-to-date Immunization History  Administered Date(s) Administered   Fluad Quad(high Dose 65+) 01/20/2020, 11/01/2021   Influenza, High Dose Seasonal PF 10/12/2018, 10/24/2020, 10/15/2022   Influenza,inj,Quad PF,6+ Mos 11/11/2012   Influenza-Unspecified 10/25/2013, 10/27/2014, 10/24/2015, 10/19/2017   PFIZER Comirnaty(Gray Top)Covid-19 Tri-Sucrose Vaccine 10/15/2022   PFIZER(Purple Top)SARS-COV-2 Vaccination  03/06/2019, 03/24/2019, 01/20/2020, 06/27/2020   PNEUMOCOCCAL CONJUGATE-20 09/06/2020   Pfizer Covid-19 Vaccine Bivalent Booster 40yrs & up 12/10/2020, 06/05/2021   Pneumococcal Conjugate-13 08/21/2014   Pneumococcal Polysaccharide-23 10/02/2009  Td 09/17/2009, 10/31/2019   Zoster Recombinant(Shingrix) 06/27/2020, 08/27/2020   Zoster, Live 12/29/2012   4. Prostate cancer screening-  past age based screening recommendations  5. Colon cancer screening -  past age based screening recommendations   6. Skin cancer screening- Dr. Margo Aye as needed.  advised regular sunscreen use. Denies worrisome, changing, or new skin lesions.  7. Smoking associated screening (lung cancer screening, AAA screen 65-75, UA)- never smoker  8. STD screening - not sexually active- ongoing issues with wife who moved out and not active   Status of chronic or acute concerns   #Heart failure with reduced ejection fraction/ ischemic cardiomypopathy- Dr. Gala Romney #hypertension S: Medication:Lasix 40 mg as needed (takes potassium  with Lasix), losartan 25 mg daily, spironolactone 12.5 mg  at night - occasionally including on Tuesday of this week as well as today has had to hold the losartan and spironolactone if blood pressure too low when he gets up. Started feeling run down on Sunday- weak but not ill otherwise -prior Carvedilol 3.125 mg twice daily -EF 07/25/22 of 30-35%  - stable  Edema: trace edema possibly some worse than last visit Weight gain:none. Weight August 2 was 210 and was 210 as well on September 2nd- pretty stable- perhaps up slightly from July where more in low 210s range Shortness of breath: mild today Orthopnea/PND: only 1 pillow  A/P:  CHF - appears euvolemic despite having to hold spironolactone and losartan at times with lower blood pressure- has close follow up next week with Dr. Gala Romney and wed like to get his opinion on hypertension management Hypertension - overcontrolled at times- even without  medicine today only 100/70 and feels weak- update bloodwork to look for other causes today.  - I agree with him on if he feels poorly and blood pressure <110/70 that he can hold spironolactone and losartan until upcoming visit  #CAD with defibrillator in place/hyperlipidemia #statin myalgia on higher doser #Apical thrombus-on Coumadin through cardiology clinic S: Medication:Aspirin 81 mg, simvastatin 40 mg (half of 40 mg)  -imdur 15 mg caused low blood pressure into 80's with weakness and headache(s)  Lab Results  Component Value Date   CHOL 146 09/09/2021   HDL 47.20 09/09/2021   LDLCALC 71 09/09/2021   LDLDIRECT 75.0 06/06/2022   TRIG 136.0 09/09/2021   CHOLHDL 3 09/09/2021   A/P: coronary artery disease - glad has close follow up with cardiology with weakness sensation and blood pressure running lower- we will update labs cholesterol- hopefully stable- update lipid panel today. Continue current meds for now   # Diabetes S: Medication:metformin 500 mg xr CBGs- Home readings have been between 101-164 fasting  Lab Results  Component Value Date   HGBA1C 5.9 (A) 06/02/2022   HGBA1C 6.2 01/30/2022   HGBA1C 6.4 09/09/2021  A/P: diabetes has been well controlled - hopefully stable- update a1c today. Continue current meds for now   #BPH- on tamsulosin 0.4 mg   -some incontinence- wears pads -no history of urinary retention- some weakness and lightheadedness with standing with lower blood pressure lately- we will have him hold tamsulosin for now and see if that helps his symptoms - considered jardiance for diabetes and heart failure but with stopping Flomax and urinary tract infection risk I ended up holding off   #Melena history-history of gastric erosions later requiring cauterization-on omeprazole for 3 months in 2020 - now off all rx for GERD -with weakness asked if he has any dark stools lately.  Recommended follow up: Return  in about 4 months (around 02/15/2023) for followup or  sooner if needed.Schedule b4 you leave. Future Appointments  Date Time Provider Department Center  10/22/2022 10:30 AM [facility]  10/22/2022  2:40 PM Bensimhon, Bevelyn Buckles, MD [facility] None  10/31/2022  3:35 PM [facility] DEVICE REMOTES [facility]  01/30/2023  3:35 PM [facility] DEVICE REMOTES [facility]  05/01/2023  3:35 PM [facility] DEVICE REMOTES [facility]  07/31/2023  3:35 PM [facility] DEVICE REMOTES [facility]  08/27/2023  3:00 PM [facility] ANNUAL WELLNESS VISIT 1 [facility]   Lab/Order associations: fasting   ICD-10-CM   1. Preventative health care  Z00.00     2. DM (diabetes mellitus) type II, controlled, with peripheral vascular disorder (HCC)  E11.51 Comprehensive metabolic panel    CBC with Differential/Platelet    Lipid panel    Hemoglobin A1c    3. Essential hypertension  I10     4. Hyperlipidemia associated with type 2 diabetes mellitus (HCC)  E11.69 Comprehensive metabolic panel   Z56.3 CBC with Differential/Platelet    Lipid panel    TSH    5. High risk medication use  Z79.899 Vitamin B12    6. Chronic systolic heart failure (HCC)  O75.64       No orders of the defined types were placed in this encounter.   Return precautions advised.  Tana Conch, MD

## 2022-10-16 NOTE — Patient Instructions (Addendum)
-   I agree with him on if he feels poorly and blood pressure <110/70 that he can hold spironolactone and losartan until upcoming visit with cardiology  -no history of urinary retention- some weakness and lightheadedness with standing with lower blood pressure lately- we will have him hold tamsulosin/Flomax  for now and see if that helps his symptoms  If symptoms worsen seek care  Please stop by lab before you go If you have mychart- we will send your results within 3 business days of [place] receiving them.  If you do not have mychart- we will call you about results within 5 business days of [place] receiving them.  *please also note that you will see labs on mychart as soon as they post. I will later go in and write notes on them- will say "notes from Dr. Durene Cal"   Recommended follow up: Return in about 4 months (around 02/15/2023) for followup or sooner if needed.Schedule b4 you leave.

## 2022-10-17 LAB — COMPREHENSIVE METABOLIC PANEL
ALT: 17 U/L (ref 0–53)
AST: 20 U/L (ref 0–37)
Albumin: 4 g/dL (ref 3.5–5.2)
Alkaline Phosphatase: 47 U/L (ref 39–117)
BUN: 28 mg/dL — ABNORMAL HIGH (ref 6–23)
CO2: 27 meq/L (ref 19–32)
Calcium: 9.5 mg/dL (ref 8.4–10.5)
Chloride: 102 meq/L (ref 96–112)
Creatinine, Ser: 1.53 mg/dL — ABNORMAL HIGH (ref 0.40–1.50)
GFR: 39.73 mL/min — ABNORMAL LOW (ref >60.00)
Glucose, Bld: 113 mg/dL — ABNORMAL HIGH (ref 70–99)
Potassium: 4.4 meq/L (ref 3.5–5.1)
Sodium: 138 meq/L (ref 135–145)
Total Bilirubin: 0.8 mg/dL (ref 0.2–1.2)
Total Protein: 7.3 g/dL (ref 6.0–8.3)

## 2022-10-17 LAB — CBC WITH DIFFERENTIAL/PLATELET
Basophils Absolute: 0 10*3/uL (ref 0.0–0.1)
Basophils Relative: 0.5 % (ref 0.0–3.0)
Eosinophils Absolute: 0 10*3/uL (ref 0.0–0.7)
Eosinophils Relative: 0.2 % (ref 0.0–5.0)
HCT: 45.5 % (ref 39.0–52.0)
Hemoglobin: 14.8 g/dL (ref 13.0–17.0)
Lymphocytes Relative: 13 % (ref 12.0–46.0)
Lymphs Abs: 1 10*3/uL (ref 0.7–4.0)
MCHC: 32.6 g/dL (ref 30.0–36.0)
MCV: 94.7 fl (ref 78.0–100.0)
Monocytes Absolute: 0.5 10*3/uL (ref 0.1–1.0)
Monocytes Relative: 6.7 % (ref 3.0–12.0)
Neutro Abs: 5.8 10*3/uL (ref 1.4–7.7)
Neutrophils Relative %: 79.6 % — ABNORMAL HIGH (ref 43.0–77.0)
Platelets: 145 10*3/uL — ABNORMAL LOW (ref 150.0–400.0)
RBC: 4.8 Mil/uL (ref 4.22–5.81)
RDW: 13.6 % (ref 11.5–15.5)
WBC: 7.3 10*3/uL (ref 4.0–10.5)

## 2022-10-17 LAB — LIPID PANEL
Cholesterol: 139 mg/dL (ref 0–200)
HDL: 56.4 mg/dL (ref >39.00)
LDL Cholesterol: 65 mg/dL (ref 0–99)
NonHDL: 83.07
Total CHOL/HDL Ratio: 2
Triglycerides: 88 mg/dL (ref 0.0–149.0)
VLDL: 17.6 mg/dL (ref 0.0–40.0)

## 2022-10-17 LAB — HEMOGLOBIN A1C: Hgb A1c MFr Bld: 6.1 % (ref 4.6–6.5)

## 2022-10-17 LAB — TSH: TSH: 0.25 u[IU]/mL — ABNORMAL LOW (ref 0.35–5.50)

## 2022-10-17 LAB — VITAMIN B12: Vitamin B-12: 389 pg/mL (ref 211–911)

## 2022-10-20 ENCOUNTER — Other Ambulatory Visit: Payer: Self-pay

## 2022-10-20 DIAGNOSIS — R7989 Other specified abnormal findings of blood chemistry: Secondary | ICD-10-CM

## 2022-10-22 ENCOUNTER — Encounter (HOSPITAL_COMMUNITY): Payer: Self-pay | Admitting: Internal Medicine

## 2022-10-22 ENCOUNTER — Ambulatory Visit (INDEPENDENT_AMBULATORY_CARE_PROVIDER_SITE_OTHER): Payer: Medicare Other

## 2022-10-22 ENCOUNTER — Other Ambulatory Visit (INDEPENDENT_AMBULATORY_CARE_PROVIDER_SITE_OTHER): Payer: Medicare Other

## 2022-10-22 ENCOUNTER — Ambulatory Visit (HOSPITAL_COMMUNITY)
Admission: RE | Admit: 2022-10-22 | Discharge: 2022-10-22 | Disposition: A | Discharge status: Home / Self Care | Payer: Medicare Other | Source: Ambulatory Visit | Attending: Internal Medicine | Admitting: Internal Medicine

## 2022-10-22 VITALS — BP 104/70 | HR 85 | Wt 224.2 lb

## 2022-10-22 DIAGNOSIS — I255 Ischemic cardiomyopathy: Secondary | ICD-10-CM | POA: Diagnosis not present

## 2022-10-22 DIAGNOSIS — I5022 Chronic systolic (congestive) heart failure: Secondary | ICD-10-CM | POA: Diagnosis not present

## 2022-10-22 DIAGNOSIS — I110 Hypertensive heart disease with heart failure: Secondary | ICD-10-CM | POA: Insufficient documentation

## 2022-10-22 DIAGNOSIS — Z7901 Long term (current) use of anticoagulants: Secondary | ICD-10-CM | POA: Diagnosis not present

## 2022-10-22 DIAGNOSIS — I5032 Chronic diastolic (congestive) heart failure: Secondary | ICD-10-CM

## 2022-10-22 DIAGNOSIS — I2510 Atherosclerotic heart disease of native coronary artery without angina pectoris: Secondary | ICD-10-CM

## 2022-10-22 DIAGNOSIS — R7989 Other specified abnormal findings of blood chemistry: Secondary | ICD-10-CM

## 2022-10-22 DIAGNOSIS — I513 Intracardiac thrombosis, not elsewhere classified: Secondary | ICD-10-CM

## 2022-10-22 DIAGNOSIS — Z951 Presence of aortocoronary bypass graft: Secondary | ICD-10-CM | POA: Diagnosis not present

## 2022-10-22 LAB — POCT INR: INR: 1.8 — AB (ref 2.0–3.0)

## 2022-10-22 LAB — T3, FREE: T3, Free: 2.9 pg/mL (ref 2.3–4.2)

## 2022-10-22 LAB — T4, FREE: Free T4: 0.94 ng/dL (ref 0.60–1.60)

## 2022-10-22 LAB — TSH: TSH: 0.51 u[IU]/mL (ref 0.35–5.50)

## 2022-10-22 NOTE — Patient Instructions (Addendum)
Pre visit review using our clinic review tool, if applicable. No additional management support is needed unless otherwise documented below in the visit note.  Increase dose today to take 1 1/2 tablets and change weekly dose to take 1 tablet daily except 1/2 tablet on Mondays. Recheck in 3 weeks.

## 2022-10-22 NOTE — Progress Notes (Signed)
Increase dose today to take 1 1/2 tablets and change weekly dose to take 1 tablet daily except 1/2 tablet on Mondays. Recheck in 3 weeks.

## 2022-10-22 NOTE — Progress Notes (Signed)
I have reviewed and agree with note, evaluation, plan.   Stephen Hunter, MD  

## 2022-10-22 NOTE — Progress Notes (Signed)
Advanced [facility] Failure Clinic Note   Date:  10/22/2022   ID:  Joseph Malone, DOB 12/21/1930, MRN [id number]  Location: Home  Provider location: [facility] Type of Visit: Established patient  PCP:  Shelva Majestic, MD  HF Cardiologist: Ariany Kesselman  Chief Complaint: Heart Failure follow-up   HPI: Joseph Malone is a pleasant 89 y.o. male with a history of coronary artery disease status post large anterior MI with coronary  bypass grafting in 2001 with a LIMA to the LAD.  He has history of  congestive heart failure secondary to resultant ischemic cardiomyopathy, ejection fraction however is in the 35%.  He is status post  single-chamber ICD.    The rest of his medical history is notable for obesity, hypertension,  hyperlipidemia, and glucose intolerance.  Cath in 5/19 with stable CAD with patent LIMA to LAD.  Echo (10/23): EF stable 30-35%  Admitted 12/23 with GIB. INR 2.4 on admit, FOBT + and warfarin held. GI consulted, underwent colonoscopy showing 1 polyp removed. Had diverticulosis in the sigmoid colon and descending colon. There was a nonbleeding internal hemorrhoid. No clear evidence of recent GI bleed. Warfarin resumed. Hospitalization c/b by AKI, losartan and spiro held, only spiro restarted at discharge.   Presented to the ED 5/24 with chest pain. Woke up from sleep with 6/10 chest pain. Tums didn't help, SLNTG helped relieve his symptoms. Cardiology consulted. HsTrop 17>18. EKG no St changes. Discharged same day with resolved symptoms.   Echo 6/24 EF 30-35%  Today he returns for AHF follow up. Feels ok. [place]. Earlier this month BP dropped down to 80-90 range. So now checks BP in am. Says he is only taking losartan and spiro 2x/week if SBP > 110. Continues with mild intermittent CP (no change). No edema.   Cardiac Studies: - Echo (10/23): EF 30-35% - R/LHC 06/12/2017   Ao = 143/77 (105) LV 145/8 RA = 3 RV = 26/4 PA = 27/5  (12) PCW = 9 Fick cardiac output/index = 4.4/2.0 PVR = 0.7 WU FA sat = 97% PA sat = 62%, 63%  1. Left-dominant coronary system with chronically-occluded LAD 2. Non-obstructive CAD elsewhere 3. Patent LIMA to LAD 4. LV gram not performed due to LV thrombus 5. Normal right heart pressure with mildly reduced cardiac output  - Echo 05/2017 EF 30-35%   Past Medical History:  Diagnosis Date   Anxiety    BPH (benign prostatic hypertrophy)    CAD (coronary artery disease)    s/p CABG   CHF (congestive heart failure) (HCC)    Diabetes 1.5, managed as type 2 (HCC)    Diverticulosis    Diverticulosis of colon without hemorrhage 10/22/2007   Qualifier: Diagnosis of  By: Alwyn Ren MD, Chrissie Noa     DOE (dyspnea on exertion)    Excess weight    Fasting hyperglycemia    Generalized weakness    GERD (gastroesophageal reflux disease)    Heart attack (HCC) 2001   Heart failure    CHF due to ischemic CM   History of kidney stones    Hyperlipidemia    Hypertension    Ischemic cardiomyopathy    Microscopic hematuria    [facility]   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   Past Surgical History:  Procedure Laterality Date   APPENDECTOMY     BIOPSY  10/21/2018   Procedure: BIOPSY;  Surgeon: Benancio Deeds, MD;  Location: [facility];  Service: Gastroenterology;;   CARDIAC CATHETERIZATION  2000   CARDIAC DEFIBRILLATOR PLACEMENT  2005   [employer]; s/p ICD gen change and RV lead revision April 2015   CATARACT EXTRACTION  2008   bilateral with lens implant   COLONOSCOPY  2004   Tics; [facility]   COLONOSCOPY WITH PROPOFOL N/A 01/16/2022   Procedure: COLONOSCOPY WITH PROPOFOL;  Surgeon: Napoleon Form, MD;  Location: [facility];  Service: Gastroenterology;  Laterality: N/A;   CORONARY ARTERY BYPASS GRAFT  2000   1 vessel   CYSTOSCOPY  06/2012   Dr Margarita Grizzle   ESOPHAGOGASTRODUODENOSCOPY (EGD) WITH PROPOFOL N/A 10/21/2018   Procedure: ESOPHAGOGASTRODUODENOSCOPY (EGD)  WITH PROPOFOL;  Surgeon: Benancio Deeds, MD;  Location: [facility];  Service: Gastroenterology;  Laterality: N/A;   HOT HEMOSTASIS N/A 10/21/2018   Procedure: HOT HEMOSTASIS (ARGON PLASMA COAGULATION/BICAP);  Surgeon: Benancio Deeds, MD;  Location: [facility];  Service: Gastroenterology;  Laterality: N/A;   IMPLANTABLE CARDIOVERTER DEFIBRILLATOR (ICD) GENERATOR CHANGE N/A 06/01/2013   Procedure: ICD GENERATOR CHANGE;  Surgeon: Marinus Maw, MD;  Location: [facility] CATH LAB;  Service: Cardiovascular;  Laterality: N/A;   LEAD REVISION N/A 06/01/2013   Procedure: LEAD REVISION;  Surgeon: Marinus Maw, MD;  Location: [facility] CATH LAB;  Service: Cardiovascular;  Laterality: N/A;   PILONIDAL CYST EXCISION     POLYPECTOMY  01/16/2022   Procedure: POLYPECTOMY;  Surgeon: Napoleon Form, MD;  Location: [facility];  Service: Gastroenterology;;   RIGHT/LEFT HEART CATH AND CORONARY/GRAFT ANGIOGRAPHY N/A 06/12/2017   Procedure: RIGHT/LEFT HEART CATH AND CORONARY/GRAFT ANGIOGRAPHY;  Surgeon: Dolores Patty, MD;  Location: [facility] INVASIVE CV LAB;  Service: Cardiovascular;  Laterality: N/A;   TRANSTHORACIC ECHOCARDIOGRAM  08/29/2005   Current Outpatient Medications  Medication Sig Dispense Refill   aspirin 81 MG tablet Take 1 tablet (81 mg total) by mouth every evening.     Cholecalciferol (VITAMIN D3) 2000 UNITS TABS Take 2,000 Units by mouth daily.     folic acid (FOLVITE) 400 MCG tablet Take 400 mcg by mouth daily.     furosemide (LASIX) 40 MG tablet Take 1 tablet (40 mg total) by mouth daily as needed. 30 tablet 3   Glucosamine HCl 1500 MG TABS Take 1,500 mg by mouth every evening.      losartan (COZAAR) 25 MG tablet Take 1 tablet (25 mg total) by mouth daily. 90 tablet 3   Melatonin 5 MG CHEW Chew 1 tablet by mouth daily as needed.     metFORMIN (GLUCOPHAGE-XR) 500 MG 24 hr tablet TAKE 1 TABLET(500 MG) BY MOUTH DAILY WITH BREAKFAST 90 tablet 3   Multiple Vitamin (MULTIVITAMIN WITH MINERALS)  TABS Take 1 tablet by mouth daily.      nitroGLYCERIN (NITROSTAT) 0.4 MG SL tablet Place 1 tablet (0.4 mg total) under the tongue every 5 (five) minutes as needed for chest pain. 1 tablet every 5 minutes for 3 dose only 90 tablet 3   Rectal Protectant-Emollient (CALMOL-4) 76-10 % SUPP Use as directed once to twice daily 6 suppository 0   simvastatin (ZOCOR) 40 MG tablet Take 0.5 tablets (20 mg total) by mouth at bedtime. 45 tablet 3   spironolactone (ALDACTONE) 25 MG tablet Take 0.5 tablets (12.5 mg total) by mouth daily. 90 tablet 3   tamsulosin (FLOMAX) 0.4 MG CAPS capsule Take 0.4 mg by mouth at bedtime.     vitamin B-12 (CYANOCOBALAMIN) 100 MCG tablet Take 100 mcg by mouth daily.     warfarin (COUMADIN) 5  MG tablet TAKE 1 TABLET BY MOUTH ON MONDAY, WEDNESDAY, AND FRIDAY, AND TAKE ONE-HALF TABLET ALL OTHER DAYS OR AS DIRECTED (Patient taking differently: TAKE 1 TABLET BY MOUTH DAILY EXCEPT TAKE 1/2 TABLET ON MONDAY AND THURSDAY OR AS DIRECTED BY ANTICOAGULATION CLINIC) 100 tablet 1   No current facility-administered medications for this encounter.   Allergies:   Penicillins   Social History:  The patient  reports that he has never smoked. He has been exposed to tobacco smoke. He has never used smokeless tobacco. He reports that he does not drink alcohol and does not use drugs.   Family History:  The patient's family history includes Diabetes in his brother; Heart attack (age of onset: 68) in his brother, father, and paternal uncle; Hypertension in his father; Lung cancer in his sister; Nephrolithiasis in his brother; Sudden death (age of onset: 54) in his paternal grandfather.   ROS:  Please see the history of present illness.   All other systems are personally reviewed and negative.   Recent Labs: 07/05/2022: Magnesium 1.8 07/24/2022: BNP 79.8 10/16/2022: ALT 17; BUN 28; Creatinine, Ser 1.53; Hemoglobin 14.8; Platelets 145.0; Potassium 4.4; Sodium 138 10/22/2022: TSH 0.51  Personally reviewed    Wt Readings from Last 3 Encounters:  10/22/22 101.7 kg (224 lb 3.2 oz)  10/16/22 100.2 kg (220 lb 12.8 oz)  09/10/22 100.5 kg (221 lb 9.6 oz)   BP 104/70   Pulse 85   Wt 101.7 kg (224 lb 3.2 oz)   SpO2 96%   BMI 31.27 kg/m   Physical Exam General:  elderly appearing.  No respiratory difficulty. Walked into clinic with cane HEENT: normal Neck: supple. no JVD. Carotids 2+ bilat; no bruits. No lymphadenopathy or thryomegaly appreciated. Cor: PMI nondisplaced. Regular rate & rhythm. No rubs, gallops or murmurs. Lungs: clear Abdomen: soft, nontender, nondistended. No hepatosplenomegaly. No bruits or masses. Good bowel sounds. Extremities: no cyanosis, clubbing, rash,tr edema on R Neuro: alert & orientedx3, cranial nerves grossly intact. moves all 4 extremities w/o difficulty. Affect pleasant  ICD interrogation (personally reviewed): OptiVol down, thoracic impedance up, 1 hr/day activity, no VT or AF  ASSESSMENT AND PLAN:  1) CAD:  - s/p CABG 2001 with LIMA to LAD  - Cath 5/19 with intact revascularization and normal filling pressures.  - Continue with mild chronic intermittent CP. No change. MSK vs GERD.  - Continue statin and ASA  2) Chronic systolic HF: ICM - Echo 05/2017 EF 30-35% with apical thrombus. [employer] ICD. No shocks - Cath with 5/19 with intact revascularization and normal filling pressures  - Echo 10/23 EF unchanged at 30-35%.  - Echo 6/24 EF 30-35% - NYHA III Volume status stable on exam and by device interrogation - Continue lasix 40 mg daily as needed.   - Off coreg with increased fatigue - Stop losartan due to low BP  - Continue spiro 12.5 mg at dinner. Can stop if BP drops - No SGLT2i with hx of UTIs   3) HTN - BP low lately. Changes as above  5) HL - Managed by PCP.  - Continue statin. No change.   6) Apical Thrombus - On coumadin.  - No bleeding issues.  - Follows with coumadin clinic  7) h/o GIB - colonoscopy 12/23 with 1 polyp removed,  diverticulosis and non-bleeding internal hemorrhoids; no evidence of active GI bleed.  - Resolved.    Arvilla Meres, MD  10/22/2022 2:51 PM  [facility] Clinic [facility] [address]  [facility] [place] [address] [phone] (office) [phone] (fax)

## 2022-10-22 NOTE — Patient Instructions (Signed)
Good to see you today!  STOP Losartan  Your physician recommends that you schedule a follow-up appointment in: 6 months(March 2025) Call office in  January to schedule an appointment  If you have any questions or concerns before your next appointment please send [place] a message through [place] or call our office at [phone].    TO LEAVE A MESSAGE FOR THE NURSE SELECT OPTION 2, PLEASE LEAVE A MESSAGE INCLUDING: YOUR NAME DATE OF BIRTH CALL BACK NUMBER REASON FOR CALL**this is important as we prioritize the call backs  YOU WILL RECEIVE A CALL BACK THE SAME DAY AS LONG AS YOU CALL BEFORE 4:00 PM  At the [facility], you and your health needs are our priority. As part of our continuing mission to provide you with exceptional heart care, we have created designated Provider Care Teams. These Care Teams include your primary Cardiologist (physician) and Advanced Practice Providers (APPs- Physician Assistants and Nurse Practitioners) who all work together to provide you with the care you need, when you need it.   You may see any of the following providers on your designated Care Team at your next follow up: Dr Arvilla Meres Dr Marca Ancona Dr. Marcos Eke, NP Robbie Lis, [place] [facility] [place], [place] Brynda Peon, NP Karle Plumber, PharmD   Please be sure to bring in all your medications bottles to every appointment.    Thank you for choosing [facility]

## 2022-10-24 ENCOUNTER — Encounter: Payer: Medicare Other | Admitting: Pharmacist

## 2022-10-27 ENCOUNTER — Other Ambulatory Visit: Payer: Medicare Other

## 2022-10-30 ENCOUNTER — Encounter: Payer: Self-pay | Admitting: Internal Medicine

## 2022-10-31 ENCOUNTER — Ambulatory Visit (INDEPENDENT_AMBULATORY_CARE_PROVIDER_SITE_OTHER): Payer: Medicare Other

## 2022-10-31 DIAGNOSIS — I5032 Chronic diastolic (congestive) heart failure: Secondary | ICD-10-CM | POA: Diagnosis not present

## 2022-11-03 LAB — CUP PACEART REMOTE DEVICE CHECK
Battery Remaining Longevity: 33 mo
Battery Voltage: 2.91 V
Brady Statistic RV Percent Paced: 0.07 %
Date Time Interrogation Session: 20240923125129
HighPow Impedance: 68 Ohm
Implantable Lead Connection Status: 753985
Implantable Lead Implant Date: 20150422
Implantable Lead Location: 753860
Implantable Lead Model: 6935
Implantable Pulse Generator Implant Date: 20150422
Lead Channel Impedance Value: 418 Ohm
Lead Channel Impedance Value: 475 Ohm
Lead Channel Pacing Threshold Amplitude: 0.5 V
Lead Channel Pacing Threshold Pulse Width: 0.4 ms
Lead Channel Sensing Intrinsic Amplitude: 13.5 mV
Lead Channel Sensing Intrinsic Amplitude: 13.5 mV
Lead Channel Setting Pacing Amplitude: 2 V
Lead Channel Setting Pacing Pulse Width: 0.4 ms
Lead Channel Setting Sensing Sensitivity: 0.3 mV

## 2022-11-10 NOTE — Progress Notes (Signed)
Remote ICD transmission.   

## 2022-11-12 ENCOUNTER — Ambulatory Visit (INDEPENDENT_AMBULATORY_CARE_PROVIDER_SITE_OTHER): Payer: Medicare Other

## 2022-11-12 DIAGNOSIS — Z7901 Long term (current) use of anticoagulants: Secondary | ICD-10-CM

## 2022-11-12 LAB — POCT INR: INR: 2.8 (ref 2.0–3.0)

## 2022-11-12 NOTE — Patient Instructions (Addendum)
Pre visit review using our clinic review tool, if applicable. No additional management support is needed unless otherwise documented below in the visit note.  Continue 1 tablet daily except 1/2 tablet on Mondays. Recheck in 6 weeks.

## 2022-11-12 NOTE — Progress Notes (Signed)
Continue 1 tablet daily except 1/2 tablet on Mondays. Recheck in 6 weeks.

## 2022-11-12 NOTE — Progress Notes (Signed)
I have reviewed the patient's encounter and agree with the documentation.  Katina Degree. Jimmey Ralph, MD 11/12/2022 10:41 AM

## 2022-11-17 ENCOUNTER — Encounter: Payer: Self-pay | Admitting: Family Medicine

## 2022-11-17 ENCOUNTER — Ambulatory Visit (INDEPENDENT_AMBULATORY_CARE_PROVIDER_SITE_OTHER): Payer: Medicare Other | Admitting: Family Medicine

## 2022-11-17 VITALS — BP 132/78 | HR 80 | Temp 97.6°F | Ht 71.0 in | Wt 222.8 lb

## 2022-11-17 DIAGNOSIS — J029 Acute pharyngitis, unspecified: Secondary | ICD-10-CM | POA: Diagnosis not present

## 2022-11-17 NOTE — Progress Notes (Signed)
Subjective  CC:  Chief Complaint  Patient presents with   Sore Throat    Pt states that he has a sore throat since the beach Thursday night, feels lump in throat, difficulty swallowing. Pt has taken OTC oral spray, helped with throat. Possible food allergy to seafood. Feels better today.    HPI: Joseph Malone is a 89 y.o. male who presents to the office today to address the problems listed above in the chief complaint. C/o sore throat, mild URI sxs without fever.  Started 3 days ago.  No SOB or GI sxs. No known exposure ot strep or mono. OTC analgesics have been used with minimal or mild relief. Eating and drinking OK.  Today feeling much better. I reviewed the patients updated PMH, FH, and SocHx.    Patient Active Problem List   Diagnosis Date Noted   Polyp of ascending colon 01/16/2022   Acute GI bleeding 01/14/2022   GI bleeding 01/13/2022   Apical mural thrombus 01/13/2022   Chronic combined systolic and diastolic congestive heart failure (HCC) 01/13/2022   Atherosclerosis of aorta (HCC) 05/18/2020   PVC's (premature ventricular contractions) 01/24/2020   Hematochezia    Long term (current) use of anticoagulants 07/08/2017   Nephrolithiasis 03/24/2016   Erectile dysfunction 01/19/2014   Malignant basal cell neoplasm of skin 09/29/2012   Solitary pulmonary nodule 07/12/2012   Chronic systolic heart failure (HCC) 12/09/2010   Cardiomyopathy, ischemic 10/02/2009   Automatic implantable cardioverter-defibrillator in situ 01/16/2009   DM (diabetes mellitus) type II, controlled, with peripheral vascular disorder (HCC) 09/14/2008   BPH without obstruction/lower urinary tract symptoms 10/22/2007   Hyperlipidemia associated with type 2 diabetes mellitus (HCC) 04/22/2007   Essential hypertension 09/25/2006   CAD in native artery 09/25/2006   Osteoarthritis 09/25/2006   Hematuria 09/25/2006   Current Meds  Medication Sig   aspirin 81 MG tablet Take 1 tablet (81 mg total) by  mouth every evening.   Cholecalciferol (VITAMIN D3) 2000 UNITS TABS Take 2,000 Units by mouth daily.   folic acid (FOLVITE) 400 MCG tablet Take 400 mcg by mouth daily.   furosemide (LASIX) 40 MG tablet Take 1 tablet (40 mg total) by mouth daily as needed.   Glucosamine HCl 1500 MG TABS Take 1,500 mg by mouth every evening.    Melatonin 5 MG CHEW Chew 1 tablet by mouth daily as needed.   metFORMIN (GLUCOPHAGE-XR) 500 MG 24 hr tablet TAKE 1 TABLET(500 MG) BY MOUTH DAILY WITH BREAKFAST   Multiple Vitamin (MULTIVITAMIN WITH MINERALS) TABS Take 1 tablet by mouth daily.    nitroGLYCERIN (NITROSTAT) 0.4 MG SL tablet Place 1 tablet (0.4 mg total) under the tongue every 5 (five) minutes as needed for chest pain. 1 tablet every 5 minutes for 3 dose only   Rectal Protectant-Emollient (CALMOL-4) 76-10 % SUPP Use as directed once to twice daily   simvastatin (ZOCOR) 40 MG tablet Take 0.5 tablets (20 mg total) by mouth at bedtime.   spironolactone (ALDACTONE) 25 MG tablet Take 0.5 tablets (12.5 mg total) by mouth daily.   tamsulosin (FLOMAX) 0.4 MG CAPS capsule Take 0.4 mg by mouth at bedtime.   vitamin B-12 (CYANOCOBALAMIN) 100 MCG tablet Take 100 mcg by mouth daily.   warfarin (COUMADIN) 5 MG tablet TAKE 1 TABLET BY MOUTH ON MONDAY, WEDNESDAY, AND FRIDAY, AND TAKE ONE-HALF TABLET ALL OTHER DAYS OR AS DIRECTED (Patient taking differently: TAKE 1 TABLET BY MOUTH DAILY EXCEPT TAKE 1/2 TABLET ON MONDAY AND THURSDAY OR AS  DIRECTED BY ANTICOAGULATION CLINIC)    Allergies: Patient is allergic to penicillins.  Review of Systems: Constitutional: Negative for fever malaise or anorexia Cardiovascular: negative for chest pain Respiratory: negative for SOB or persistent cough Gastrointestinal: negative for abdominal pain  Objective  Vitals: BP 132/78   Pulse 80   Temp 97.6 F (36.4 C)   Ht 5\' 11"  (1.803 m)   Wt 222 lb 12.8 oz (101.1 kg)   SpO2 94%   BMI 31.07 kg/m  General: no acute distress ,  A&Ox3 HEENT: PEERL, conjunctiva normal, bilateral EAC and TMs are normal. Nares normal. Oropharynx moist with mildly erythematous posterior pharynx  exudate, without cervical LAD, 2+ tonsils, midline uvula, neck is supple Cardiovascular:  RRR without murmur or gallop.  Respiratory:  Good breath sounds bilaterally, CTAB with normal respiratory effort Skin:  Warm, no rashes    Assessment  1. Viral pharyngitis      Plan  Fortunately improving.  No red flag symptoms present.  Supportive care with advil, tylenol, gargles etc discussed. . RTO if sxs persist or worsen.   Follow up: as needed  Commons side effects, risks, benefits, and alternatives for medications and treatment plan prescribed today were discussed, and the patient expressed understanding of the given instructions. Patient is instructed to call or message via MyChart if he/she has any questions or concerns regarding our treatment plan. No barriers to understanding were identified. We discussed Red Flag symptoms and signs in detail. Patient expressed understanding regarding what to do in case of urgent or emergency type symptoms.  Medication list was reconciled, printed and provided to the patient in AVS. Patient instructions and summary information was reviewed with the patient as documented in the AVS. This note was prepared with assistance of Dragon voice recognition software. Occasional wrong-word or sound-a-like substitutions may have occurred due to the inherent limitations of voice recognition software  No orders of the defined types were placed in this encounter.  No orders of the defined types were placed in this encounter.

## 2022-12-05 ENCOUNTER — Inpatient Hospital Stay (HOSPITAL_COMMUNITY)
Admission: EM | Admit: 2022-12-05 | Discharge: 2022-12-10 | DRG: 177 | Disposition: A | Discharge status: Home Health | Payer: Medicare Other | Attending: Family Medicine | Admitting: Family Medicine

## 2022-12-05 ENCOUNTER — Encounter (HOSPITAL_COMMUNITY): Payer: Self-pay

## 2022-12-05 ENCOUNTER — Other Ambulatory Visit: Payer: Self-pay

## 2022-12-05 ENCOUNTER — Emergency Department (HOSPITAL_COMMUNITY): Payer: Medicare Other

## 2022-12-05 DIAGNOSIS — I255 Ischemic cardiomyopathy: Secondary | ICD-10-CM | POA: Diagnosis not present

## 2022-12-05 DIAGNOSIS — R918 Other nonspecific abnormal finding of lung field: Secondary | ICD-10-CM | POA: Diagnosis not present

## 2022-12-05 DIAGNOSIS — E7849 Other hyperlipidemia: Secondary | ICD-10-CM

## 2022-12-05 DIAGNOSIS — I130 Hypertensive heart and chronic kidney disease with heart failure and stage 1 through stage 4 chronic kidney disease, or unspecified chronic kidney disease: Secondary | ICD-10-CM | POA: Diagnosis present

## 2022-12-05 DIAGNOSIS — I959 Hypotension, unspecified: Secondary | ICD-10-CM | POA: Diagnosis not present

## 2022-12-05 DIAGNOSIS — Z95810 Presence of automatic (implantable) cardiac defibrillator: Secondary | ICD-10-CM | POA: Diagnosis not present

## 2022-12-05 DIAGNOSIS — Z79899 Other long term (current) drug therapy: Secondary | ICD-10-CM

## 2022-12-05 DIAGNOSIS — Z951 Presence of aortocoronary bypass graft: Secondary | ICD-10-CM | POA: Diagnosis not present

## 2022-12-05 DIAGNOSIS — R296 Repeated falls: Secondary | ICD-10-CM | POA: Diagnosis present

## 2022-12-05 DIAGNOSIS — N3000 Acute cystitis without hematuria: Secondary | ICD-10-CM | POA: Diagnosis not present

## 2022-12-05 DIAGNOSIS — Z7722 Contact with and (suspected) exposure to environmental tobacco smoke (acute) (chronic): Secondary | ICD-10-CM | POA: Diagnosis not present

## 2022-12-05 DIAGNOSIS — R531 Weakness: Secondary | ICD-10-CM | POA: Diagnosis not present

## 2022-12-05 DIAGNOSIS — I513 Intracardiac thrombosis, not elsewhere classified: Secondary | ICD-10-CM | POA: Diagnosis not present

## 2022-12-05 DIAGNOSIS — B9620 Unspecified Escherichia coli [E. coli] as the cause of diseases classified elsewhere: Secondary | ICD-10-CM | POA: Diagnosis not present

## 2022-12-05 DIAGNOSIS — E785 Hyperlipidemia, unspecified: Secondary | ICD-10-CM | POA: Diagnosis not present

## 2022-12-05 DIAGNOSIS — Z8679 Personal history of other diseases of the circulatory system: Secondary | ICD-10-CM

## 2022-12-05 DIAGNOSIS — R0602 Shortness of breath: Secondary | ICD-10-CM | POA: Diagnosis not present

## 2022-12-05 DIAGNOSIS — Z7901 Long term (current) use of anticoagulants: Secondary | ICD-10-CM

## 2022-12-05 DIAGNOSIS — I5042 Chronic combined systolic (congestive) and diastolic (congestive) heart failure: Secondary | ICD-10-CM | POA: Diagnosis present

## 2022-12-05 DIAGNOSIS — Z880 Allergy status to penicillin: Secondary | ICD-10-CM

## 2022-12-05 DIAGNOSIS — Z9842 Cataract extraction status, left eye: Secondary | ICD-10-CM

## 2022-12-05 DIAGNOSIS — W06XXXA Fall from bed, initial encounter: Secondary | ICD-10-CM | POA: Diagnosis present

## 2022-12-05 DIAGNOSIS — I5040 Unspecified combined systolic (congestive) and diastolic (congestive) heart failure: Secondary | ICD-10-CM | POA: Diagnosis present

## 2022-12-05 DIAGNOSIS — Z7982 Long term (current) use of aspirin: Secondary | ICD-10-CM

## 2022-12-05 DIAGNOSIS — Y92003 Bedroom of unspecified non-institutional (private) residence as the place of occurrence of the external cause: Secondary | ICD-10-CM | POA: Diagnosis not present

## 2022-12-05 DIAGNOSIS — Z7984 Long term (current) use of oral hypoglycemic drugs: Secondary | ICD-10-CM

## 2022-12-05 DIAGNOSIS — Z87440 Personal history of urinary (tract) infections: Secondary | ICD-10-CM

## 2022-12-05 DIAGNOSIS — N1832 Chronic kidney disease, stage 3b: Secondary | ICD-10-CM | POA: Diagnosis present

## 2022-12-05 DIAGNOSIS — Z8616 Personal history of COVID-19: Secondary | ICD-10-CM

## 2022-12-05 DIAGNOSIS — I447 Left bundle-branch block, unspecified: Secondary | ICD-10-CM | POA: Diagnosis present

## 2022-12-05 DIAGNOSIS — Z8249 Family history of ischemic heart disease and other diseases of the circulatory system: Secondary | ICD-10-CM

## 2022-12-05 DIAGNOSIS — Z9841 Cataract extraction status, right eye: Secondary | ICD-10-CM

## 2022-12-05 DIAGNOSIS — I10 Essential (primary) hypertension: Secondary | ICD-10-CM | POA: Diagnosis present

## 2022-12-05 DIAGNOSIS — N3001 Acute cystitis with hematuria: Secondary | ICD-10-CM | POA: Diagnosis not present

## 2022-12-05 DIAGNOSIS — U071 COVID-19: Principal | ICD-10-CM | POA: Diagnosis present

## 2022-12-05 DIAGNOSIS — Z961 Presence of intraocular lens: Secondary | ICD-10-CM | POA: Diagnosis present

## 2022-12-05 DIAGNOSIS — E119 Type 2 diabetes mellitus without complications: Secondary | ICD-10-CM | POA: Diagnosis not present

## 2022-12-05 DIAGNOSIS — R000 Tachycardia, unspecified: Secondary | ICD-10-CM | POA: Diagnosis not present

## 2022-12-05 DIAGNOSIS — N400 Enlarged prostate without lower urinary tract symptoms: Secondary | ICD-10-CM | POA: Diagnosis present

## 2022-12-05 DIAGNOSIS — I252 Old myocardial infarction: Secondary | ICD-10-CM | POA: Diagnosis not present

## 2022-12-05 DIAGNOSIS — D696 Thrombocytopenia, unspecified: Secondary | ICD-10-CM | POA: Diagnosis present

## 2022-12-05 DIAGNOSIS — J1282 Pneumonia due to coronavirus disease 2019: Secondary | ICD-10-CM | POA: Diagnosis present

## 2022-12-05 DIAGNOSIS — Z87442 Personal history of urinary calculi: Secondary | ICD-10-CM

## 2022-12-05 DIAGNOSIS — Z950 Presence of cardiac pacemaker: Secondary | ICD-10-CM | POA: Diagnosis not present

## 2022-12-05 DIAGNOSIS — E1169 Type 2 diabetes mellitus with other specified complication: Secondary | ICD-10-CM | POA: Diagnosis present

## 2022-12-05 DIAGNOSIS — I2510 Atherosclerotic heart disease of native coronary artery without angina pectoris: Secondary | ICD-10-CM | POA: Diagnosis present

## 2022-12-05 DIAGNOSIS — E1322 Other specified diabetes mellitus with diabetic chronic kidney disease: Secondary | ICD-10-CM | POA: Diagnosis present

## 2022-12-05 DIAGNOSIS — Z833 Family history of diabetes mellitus: Secondary | ICD-10-CM

## 2022-12-05 DIAGNOSIS — R079 Chest pain, unspecified: Secondary | ICD-10-CM | POA: Diagnosis not present

## 2022-12-05 DIAGNOSIS — W19XXXA Unspecified fall, initial encounter: Secondary | ICD-10-CM | POA: Diagnosis not present

## 2022-12-05 DIAGNOSIS — R0789 Other chest pain: Secondary | ICD-10-CM | POA: Diagnosis not present

## 2022-12-05 DIAGNOSIS — Z8719 Personal history of other diseases of the digestive system: Secondary | ICD-10-CM

## 2022-12-05 LAB — URINALYSIS, ROUTINE W REFLEX MICROSCOPIC
Bilirubin Urine: NEGATIVE
Glucose, UA: NEGATIVE mg/dL
Ketones, ur: NEGATIVE mg/dL
Nitrite: POSITIVE — AB
Protein, ur: NEGATIVE mg/dL
Specific Gravity, Urine: 1.019 (ref 1.005–1.030)
pH: 5 (ref 5.0–8.0)

## 2022-12-05 LAB — CBC
HCT: 46.1 % (ref 39.0–52.0)
Hemoglobin: 15.6 g/dL (ref 13.0–17.0)
MCH: 30.6 pg (ref 26.0–34.0)
MCHC: 33.8 g/dL (ref 30.0–36.0)
MCV: 90.4 fL (ref 80.0–100.0)
Platelets: 199 10*3/uL (ref 150–400)
RBC: 5.1 MIL/uL (ref 4.22–5.81)
RDW: 12.4 % (ref 11.5–15.5)
WBC: 8.3 10*3/uL (ref 4.0–10.5)
nRBC: 0 % (ref 0.0–0.2)

## 2022-12-05 LAB — COMPREHENSIVE METABOLIC PANEL
ALT: 22 U/L (ref 0–44)
AST: 24 U/L (ref 15–41)
Albumin: 3.7 g/dL (ref 3.5–5.0)
Alkaline Phosphatase: 46 U/L (ref 38–126)
Anion gap: 12 (ref 5–15)
BUN: 21 mg/dL (ref 8–23)
CO2: 21 mmol/L — ABNORMAL LOW (ref 22–32)
Calcium: 9.3 mg/dL (ref 8.9–10.3)
Chloride: 102 mmol/L (ref 98–111)
Creatinine, Ser: 1.45 mg/dL — ABNORMAL HIGH (ref 0.61–1.24)
GFR, Estimated: 46 mL/min — ABNORMAL LOW (ref >60)
Glucose, Bld: 144 mg/dL — ABNORMAL HIGH (ref 70–99)
Potassium: 4 mmol/L (ref 3.5–5.1)
Sodium: 135 mmol/L (ref 135–145)
Total Bilirubin: 0.3 mg/dL (ref 0.3–1.2)
Total Protein: 7.3 g/dL (ref 6.5–8.1)

## 2022-12-05 LAB — PROTIME-INR
INR: 2 — ABNORMAL HIGH (ref 0.8–1.2)
Prothrombin Time: 22.6 s — ABNORMAL HIGH (ref 11.4–15.2)

## 2022-12-05 LAB — TROPONIN I (HIGH SENSITIVITY)
Troponin I (High Sensitivity): 15 ng/L (ref <18)
Troponin I (High Sensitivity): 19 ng/L — ABNORMAL HIGH (ref <18)

## 2022-12-05 MED ORDER — SENNOSIDES-DOCUSATE SODIUM 8.6-50 MG PO TABS: 1.0000 | ORAL_TABLET | Freq: Every evening | ORAL | Status: DC | PRN

## 2022-12-05 MED ORDER — SPIRONOLACTONE 12.5 MG HALF TABLET
12.5000 mg | ORAL_TABLET | Freq: Every day | ORAL | Status: DC
  Administered 2022-12-06 – 2022-12-10 (×5): 12.5 mg via ORAL
  Filled 2022-12-05 (×5): qty 1

## 2022-12-05 MED ORDER — NITROGLYCERIN 0.4 MG SL SUBL: 0.4000 mg | SUBLINGUAL_TABLET | SUBLINGUAL | Status: DC | PRN

## 2022-12-05 MED ORDER — ASPIRIN 81 MG PO CHEW
81.0000 mg | CHEWABLE_TABLET | Freq: Every evening | ORAL | Status: DC
  Administered 2022-12-06 – 2022-12-09 (×5): 81 mg via ORAL
  Filled 2022-12-05 (×5): qty 1

## 2022-12-05 MED ORDER — SODIUM CHLORIDE 0.9 % IV SOLN
1.0000 g | INTRAVENOUS | Status: DC
  Administered 2022-12-06 – 2022-12-07 (×2): 1 g via INTRAVENOUS
  Filled 2022-12-05 (×2): qty 10

## 2022-12-05 MED ORDER — WARFARIN SODIUM 5 MG PO TABS
5.0000 mg | ORAL_TABLET | Freq: Once | ORAL | Status: AC
  Administered 2022-12-06: 5 mg via ORAL
  Filled 2022-12-05: qty 1

## 2022-12-05 MED ORDER — SODIUM CHLORIDE 0.9% FLUSH: 3.0000 mL | INTRAVENOUS | Status: DC | PRN

## 2022-12-05 MED ORDER — TAMSULOSIN HCL 0.4 MG PO CAPS
0.4000 mg | ORAL_CAPSULE | Freq: Every day | ORAL | Status: DC
Start: 2022-12-05 — End: 2022-12-10
  Administered 2022-12-06 – 2022-12-09 (×5): 0.4 mg via ORAL
  Filled 2022-12-05 (×5): qty 1

## 2022-12-05 MED ORDER — SIMVASTATIN 20 MG PO TABS
20.0000 mg | ORAL_TABLET | Freq: Every day | ORAL | Status: DC
  Administered 2022-12-06 (×2): 20 mg via ORAL
  Filled 2022-12-05 (×2): qty 1

## 2022-12-05 MED ORDER — SODIUM CHLORIDE 0.9% FLUSH
3.0000 mL | Freq: Two times a day (BID) | INTRAVENOUS | Status: DC
  Administered 2022-12-06 – 2022-12-10 (×10): 3 mL via INTRAVENOUS

## 2022-12-05 MED ORDER — SODIUM CHLORIDE 0.9 % IV SOLN: 250.0000 mL | INTRAVENOUS | Status: AC | PRN

## 2022-12-05 MED ORDER — SODIUM CHLORIDE 0.9 % IV SOLN
2.0000 g | Freq: Once | INTRAVENOUS | Status: AC
  Administered 2022-12-05: 2 g via INTRAVENOUS
  Filled 2022-12-05: qty 20

## 2022-12-05 MED ORDER — WARFARIN - PHARMACIST DOSING INPATIENT: Freq: Every day | Status: DC

## 2022-12-05 MED ORDER — ACETAMINOPHEN 650 MG RE SUPP: 650.0000 mg | Freq: Four times a day (QID) | RECTAL | Status: DC | PRN

## 2022-12-05 MED ORDER — MELATONIN 5 MG PO TABS
5.0000 mg | ORAL_TABLET | Freq: Every evening | ORAL | Status: DC | PRN
Start: 2022-12-05 — End: 2022-12-10

## 2022-12-05 MED ORDER — ACETAMINOPHEN 325 MG PO TABS
650.0000 mg | ORAL_TABLET | Freq: Four times a day (QID) | ORAL | Status: DC | PRN
  Administered 2022-12-06 – 2022-12-07 (×3): 650 mg via ORAL
  Filled 2022-12-05 (×3): qty 2

## 2022-12-05 NOTE — ED Provider Notes (Signed)
[place] EMERGENCY DEPARTMENT AT [facility] Provider Note   CSN: [id number] Arrival date & time: 12/05/22  1317     History  No chief complaint on file.   Joseph Malone is a 89 y.o. male with history of CAD, ischemic cardiomyopathy, chronic systolic heart failure, hyperlipidemia, type 2 diabetes, hypertension, BPH, pulmonary nodule, ICD in place, GI bleed, on Coumadin, who presents the emergency department complaining of weakness, fatigue, and fall from bed this morning.  Patient states that he "slid" from the bed to the floor this morning.  Did not strike his head or lose consciousness.  Endorses cough and left-sided chest pain.  Tried taking some cough syrup this morning, but has not taken any of his other daily medications.  He states that the symptoms that he is having feels similarly to UTI which she has had in the past.  States he had a little bit of abdominal discomfort this morning, but this is resolved.  HPI     Home Medications Prior to Admission medications   Medication Sig Start Date End Date Taking? Authorizing Provider  aspirin 81 MG tablet Take 1 tablet (81 mg total) by mouth every evening. 12/28/13   Bensimhon, Bevelyn Buckles, MD  Cholecalciferol (VITAMIN D3) 2000 UNITS TABS Take 2,000 Units by mouth daily.    [provider]  folic acid (FOLVITE) 400 MCG tablet Take 400 mcg by mouth daily.    [provider]  furosemide (LASIX) 40 MG tablet Take 1 tablet (40 mg total) by mouth daily as needed. 10/11/21   Bensimhon, Bevelyn Buckles, MD  Glucosamine HCl 1500 MG TABS Take 1,500 mg by mouth every evening.     [provider]  Melatonin 5 MG CHEW Chew 1 tablet by mouth daily as needed.    [provider]  metFORMIN (GLUCOPHAGE-XR) 500 MG 24 hr tablet TAKE 1 TABLET(500 MG) BY MOUTH DAILY WITH BREAKFAST 05/30/22   Shelva Majestic, MD  Multiple Vitamin (MULTIVITAMIN WITH MINERALS) TABS Take 1 tablet by mouth daily.     [provider]  nitroGLYCERIN (NITROSTAT) 0.4 MG SL tablet Place 1 tablet (0.4 mg total) under the tongue every 5 (five) minutes as needed for chest pain. 1 tablet every 5 minutes for 3 dose only 02/11/22 07/11/23  Milford, Anderson Malta, FNP  Rectal Protectant-Emollient (CALMOL-4) 76-10 % SUPP Use as directed once to twice daily 02/17/22   Armbruster, Willaim Rayas, MD  simvastatin (ZOCOR) 40 MG tablet Take 0.5 tablets (20 mg total) by mouth at bedtime. 03/28/22   Bensimhon, Bevelyn Buckles, MD  spironolactone (ALDACTONE) 25 MG tablet Take 0.5 tablets (12.5 mg total) by mouth daily. 09/09/21   Shelva Majestic, MD  tamsulosin (FLOMAX) 0.4 MG CAPS capsule Take 0.4 mg by mouth at bedtime. 12/13/19   [provider]  vitamin B-12 (CYANOCOBALAMIN) 100 MCG tablet Take 100 mcg by mouth daily.    [provider]  warfarin (COUMADIN) 5 MG tablet TAKE 1 TABLET BY MOUTH ON MONDAY, WEDNESDAY, AND FRIDAY, AND TAKE ONE-HALF TABLET ALL OTHER DAYS OR AS DIRECTED Patient taking differently: TAKE 1 TABLET BY MOUTH DAILY EXCEPT TAKE 1/2 TABLET ON MONDAY AND THURSDAY OR AS DIRECTED BY ANTICOAGULATION CLINIC 05/15/22   Shelva Majestic, MD      Allergies    Penicillins    Review of Systems   Review of Systems  Constitutional:  Positive for fatigue.  Gastrointestinal:  Positive for abdominal pain.  Neurological:  Positive for weakness.  All other systems reviewed and are negative.   Physical Exam Updated Vital Signs BP 138/75   Pulse (!) 104   Temp 100.2 F (37.9 C) (Oral)   Resp 16   Ht 5\' 11"  (1.803 m)   Wt 95.7 kg   SpO2 95%   BMI 29.43 kg/m  Physical Exam Vitals and nursing note reviewed.  Constitutional:      Appearance: Normal appearance.  HENT:     Head: Normocephalic and atraumatic.  Eyes:     Conjunctiva/sclera: Conjunctivae normal.  Cardiovascular:     Rate and Rhythm: Normal rate and regular rhythm.  Pulmonary:     Effort: Pulmonary effort is normal. No respiratory distress.     Breath sounds:  Normal breath sounds.  Abdominal:     General: There is no distension.     Palpations: Abdomen is soft.     Tenderness: There is abdominal tenderness. There is no guarding or rebound.     Comments: Very mild epigastric tenderness to palpation  Musculoskeletal:     Right lower leg: No edema.     Left lower leg: No edema.  Skin:    General: Skin is warm and dry.  Neurological:     General: No focal deficit present.     Mental Status: He is alert.     ED Results / Procedures / Treatments   Labs (all labs ordered are listed, but only abnormal results are displayed) Labs Reviewed  CBC  COMPREHENSIVE METABOLIC PANEL  URINALYSIS, ROUTINE W REFLEX MICROSCOPIC  TROPONIN I (HIGH SENSITIVITY)  TROPONIN I (HIGH SENSITIVITY)    EKG None  Radiology No results found.  Procedures Procedures    Medications Ordered in ED Medications - No data to display  ED Course/ Medical Decision Making/ A&P                                 Medical Decision Making Amount and/or Complexity of Data Reviewed Labs: ordered. Radiology: ordered.   This patient is a 89 y.o. male  who presents to the ED for concern of weakness, fatigue.   Differential diagnoses prior to evaluation: The emergent differential diagnosis includes, but is not limited to,  CVA, spinal cord injury, ACS, arrhythmia, syncope, orthostatic hypotension, sepsis, hypoglycemia, hypoxia, electrolyte disturbance, endocrine disorder, anemia, environmental exposure, polypharmacy. This is not an exhaustive differential.   Past Medical History / Co-morbidities / Social History: CAD, ischemic cardiomyopathy, chronic systolic heart failure, hyperlipidemia, type 2 diabetes, hypertension, BPH, pulmonary nodule, ICD in place, GI bleed, on Coumadin  Physical Exam: Physical exam performed. The pertinent findings include: Mildly tachycardic to 104 bpm, oral temperature 100.2 F. Abdomen soft with only mild tenderness in epigastrium. No guarding  or rebound tenderness.   Lab Tests/Imaging studies: Labs including CBC, CMP, troponin, and urinalysis, and imaging including CXR is pending at time of shift change.   Cardiac monitoring: EKG obtained and interpreted by myself and attending physician which shows: sinus tachycardia with LBBB   Disposition: Patient discussed and care transferred to resident physician Dr. Nonnie Done at shift change. Please see his/her note for further details regarding further ED course and disposition. Plan at time of handoff is follow up on labs and imaging to determine disposition.   Final Clinical Impression(s) / ED Diagnoses Final diagnoses:  Generalized weakness    Rx / DC Orders ED Discharge Orders     None      Portions  of this report may have been transcribed using voice recognition software. Every effort was made to ensure accuracy; however, inadvertent computerized transcription errors may be present.    Su Monks, PA-C 12/05/22 1507    Royanne Foots, DO 12/11/22 [phone]

## 2022-12-05 NOTE — H&P (Incomplete)
History and Physical    Joseph Malone XBM:[id number] DOB: Mar 16, 1930 DOA: 12/05/2022  PCP: Shelva Majestic, MD   Patient coming from: nursing facility   Chief Complaint: Generalized weakness and symptom of UTI ED TRIAGE note:PT BIB [facility] after experiencing weakness, fatigue and 'sliding' from the bed to the floor this morning. Denies fall or LOC. Also endorses cough and L sided CP. States he took cough syrup this morning, but none of his daily Rx meds. Says symptoms similar to past UTI experienced. [facility] vitals: T98, BP 130/60, HR 106, Spo2: 98% RR 17. Aox4.   HPI:  Joseph Malone is a 89 y.o. male with medical history significant of CAD s/p CABG x1 (LIMA-LAD), ICM, chronic combined systolic and diastolic heart failure, ischemic cardiomyopathy, ICD, LV thrombus on Coumadin, hypertension, hyperlipidemia, DM type II, CKD 3B, BPH and pulmonary nodule to emergency department complaining of generalized fatigue, weakness and fall from this bed from this morning.  Patient reported that he fell from his bed today morning.  He did not strike his head or loss consciousness.  He also endorsing some left-sided calf and chest pain since the fall.  Patient tried taking cough syrup without any improvement.  Patient also reports reporting some symptom of UTI as well.  He also has abdominal discomfort.  Denies any fever, chill, nausea, vomiting, chest pressure, headache, constipation or diarrhea.   ED Course:  At ED initial presentation patient found tachycardic 107 and heart rate dropped to 25 and elevated blood pressure 170/130. CBC unremarkable. CMP showing low bicarb 21 otherwise grossly unremarkable. Troponin 15 and 19.  Without any delta change. UA evidence of UTI. Pending blood cultures and urine cultures.  EKG showing sinus tachycardia heart rate 100.  Ventricular premature complex.  Left bundle branch block. Chest x-ray no active disease process.  With the concern for UTI in the ED patient  received Rocephin.  Hospitalist has been contacted for further evaluation management of acute cystitis.   Review of Systems:  Review of Systems  Constitutional:  Negative for chills, fever, malaise/fatigue and weight loss.  HENT:  Positive for hearing loss.   Respiratory:  Negative for cough, sputum production and shortness of breath.   Cardiovascular:  Negative for chest pain, palpitations and orthopnea.  Gastrointestinal:  Negative for heartburn and nausea.  Genitourinary:  Positive for dysuria and urgency. Negative for frequency and hematuria.  Musculoskeletal:  Negative for back pain and myalgias.  Neurological:  Negative for dizziness and headaches.  Psychiatric/Behavioral:  The patient is not nervous/anxious.     Past Medical History:  Diagnosis Date   Anxiety    BPH (benign prostatic hypertrophy)    CAD (coronary artery disease)    s/p CABG   CHF (congestive heart failure) (HCC)    Diabetes 1.5, managed as type 2 (HCC)    Diverticulosis    Diverticulosis of colon without hemorrhage 10/22/2007   Qualifier: Diagnosis of  By: Alwyn Ren MD, Chrissie Noa     DOE (dyspnea on exertion)    Excess weight    Fasting hyperglycemia    Generalized weakness    GERD (gastroesophageal reflux disease)    Heart attack (HCC) 2001   Heart failure    CHF due to ischemic CM   History of kidney stones    Hyperlipidemia    Hypertension    Ischemic cardiomyopathy    Microscopic hematuria    [facility]   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    Past Surgical  History:  Procedure Laterality Date   APPENDECTOMY     BIOPSY  10/21/2018   Procedure: BIOPSY;  Surgeon: Benancio Deeds, MD;  Location: [facility];  Service: Gastroenterology;;   CARDIAC CATHETERIZATION  2000   CARDIAC DEFIBRILLATOR PLACEMENT  2005   [employer]; s/p ICD gen change and RV lead revision April 2015   CATARACT EXTRACTION  2008   bilateral with lens implant   COLONOSCOPY  2004   Tics; [facility]    COLONOSCOPY WITH PROPOFOL N/A 01/16/2022   Procedure: COLONOSCOPY WITH PROPOFOL;  Surgeon: Napoleon Form, MD;  Location: [facility];  Service: Gastroenterology;  Laterality: N/A;   CORONARY ARTERY BYPASS GRAFT  2000   1 vessel   CYSTOSCOPY  06/2012   Dr Margarita Grizzle   ESOPHAGOGASTRODUODENOSCOPY (EGD) WITH PROPOFOL N/A 10/21/2018   Procedure: ESOPHAGOGASTRODUODENOSCOPY (EGD) WITH PROPOFOL;  Surgeon: Benancio Deeds, MD;  Location: [facility];  Service: Gastroenterology;  Laterality: N/A;   HOT HEMOSTASIS N/A 10/21/2018   Procedure: HOT HEMOSTASIS (ARGON PLASMA COAGULATION/BICAP);  Surgeon: Benancio Deeds, MD;  Location: [facility];  Service: Gastroenterology;  Laterality: N/A;   IMPLANTABLE CARDIOVERTER DEFIBRILLATOR (ICD) GENERATOR CHANGE N/A 06/01/2013   Procedure: ICD GENERATOR CHANGE;  Surgeon: Marinus Maw, MD;  Location: [facility] CATH LAB;  Service: Cardiovascular;  Laterality: N/A;   LEAD REVISION N/A 06/01/2013   Procedure: LEAD REVISION;  Surgeon: Marinus Maw, MD;  Location: [facility] CATH LAB;  Service: Cardiovascular;  Laterality: N/A;   PILONIDAL CYST EXCISION     POLYPECTOMY  01/16/2022   Procedure: POLYPECTOMY;  Surgeon: Napoleon Form, MD;  Location: [facility];  Service: Gastroenterology;;   RIGHT/LEFT HEART CATH AND CORONARY/GRAFT ANGIOGRAPHY N/A 06/12/2017   Procedure: RIGHT/LEFT HEART CATH AND CORONARY/GRAFT ANGIOGRAPHY;  Surgeon: Dolores Patty, MD;  Location: [facility] INVASIVE CV LAB;  Service: Cardiovascular;  Laterality: N/A;   TRANSTHORACIC ECHOCARDIOGRAM  08/29/2005     reports that he has never smoked. He has been exposed to tobacco smoke. He has never used smokeless tobacco. He reports that he does not drink alcohol and does not use drugs.  Allergies  Allergen Reactions   Penicillins Rash and Other (See Comments)    Because of a history of documented adverse serious drug reaction;Medi Alert bracelet  is recommended Has patient had a PCN reaction causing  immediate rash, facial/tongue/throat swelling, SOB or lightheadedness with hypotension: No Has patient had a PCN reaction causing severe rash involving mucus membranes or skin necrosis: No Has patient had a PCN reaction that required hospitalization: No Has patient had a PCN reaction occurring within the last 10 years: No If all of the above answers are "NO", t    Family History  Problem Relation Age of Onset   Hypertension Father    Heart attack Father 1   Diabetes Brother        X 2   Nephrolithiasis Brother    Heart attack Brother 24   Heart attack Paternal Uncle 22   Sudden death Paternal Grandfather 53       ? heat stroke   Lung cancer Sister        "Asian lung cancer"   Stroke Neg Hx    Colon cancer Neg Hx    Esophageal cancer Neg Hx    Rectal cancer Neg Hx    Stomach cancer Neg Hx     Prior to Admission medications   Medication Sig Start Date End Date Taking? Authorizing Provider  aspirin 81 MG tablet Take  1 tablet (81 mg total) by mouth every evening. 12/28/13   Bensimhon, Bevelyn Buckles, MD  Cholecalciferol (VITAMIN D3) 2000 UNITS TABS Take 2,000 Units by mouth daily.    [provider]  folic acid (FOLVITE) 400 MCG tablet Take 400 mcg by mouth daily.    [provider]  furosemide (LASIX) 40 MG tablet Take 1 tablet (40 mg total) by mouth daily as needed. 10/11/21   Bensimhon, Bevelyn Buckles, MD  Glucosamine HCl 1500 MG TABS Take 1,500 mg by mouth every evening.     [provider]  Melatonin 5 MG CHEW Chew 1 tablet by mouth daily as needed.    [provider]  metFORMIN (GLUCOPHAGE-XR) 500 MG 24 hr tablet TAKE 1 TABLET(500 MG) BY MOUTH DAILY WITH BREAKFAST 05/30/22   Shelva Majestic, MD  Multiple Vitamin (MULTIVITAMIN WITH MINERALS) TABS Take 1 tablet by mouth daily.     [provider]  nitroGLYCERIN (NITROSTAT) 0.4 MG SL tablet Place 1 tablet (0.4 mg total) under the tongue every 5 (five) minutes as needed for chest pain. 1 tablet  every 5 minutes for 3 dose only 02/11/22 07/11/23  [place]ford, Anderson Malta, FNP  Rectal Protectant-Emollient (CALMOL-4) 76-10 % SUPP Use as directed once to twice daily 02/17/22   Armbruster, Willaim Rayas, MD  simvastatin (ZOCOR) 40 MG tablet Take 0.5 tablets (20 mg total) by mouth at bedtime. 03/28/22   Bensimhon, Bevelyn Buckles, MD  spironolactone (ALDACTONE) 25 MG tablet Take 0.5 tablets (12.5 mg total) by mouth daily. 09/09/21   Shelva Majestic, MD  tamsulosin (FLOMAX) 0.4 MG CAPS capsule Take 0.4 mg by mouth at bedtime. 12/13/19   [provider]  vitamin B-12 (CYANOCOBALAMIN) 100 MCG tablet Take 100 mcg by mouth daily.    [provider]  warfarin (COUMADIN) 5 MG tablet TAKE 1 TABLET BY MOUTH ON MONDAY, WEDNESDAY, AND FRIDAY, AND TAKE ONE-HALF TABLET ALL OTHER DAYS OR AS DIRECTED Patient taking differently: TAKE 1 TABLET BY MOUTH DAILY EXCEPT TAKE 1/2 TABLET ON MONDAY AND THURSDAY OR AS DIRECTED BY ANTICOAGULATION CLINIC 05/15/22   Shelva Majestic, MD     Physical Exam: Vitals:   12/05/22 2200 12/05/22 2300 12/05/22 2321 12/06/22 0000  BP:   (!) 171/98 (!) 177/95  Pulse: 97 92 96 98  Resp: (!) 27 (!) 21 20 (!) 24  Temp:   99.6 F (37.6 C)   TempSrc:   Oral   SpO2: 98% 95% 97% 90%  Weight:      Height:        Physical Exam Constitutional:      General: He is not in acute distress.    Appearance: He is not ill-appearing.  HENT:     Mouth/Throat:     Mouth: Mucous membranes are moist.  Eyes:     Conjunctiva/sclera: Conjunctivae normal.  Cardiovascular:     Rate and Rhythm: Regular rhythm. Tachycardia present.     Pulses: Normal pulses.     Heart sounds: Normal heart sounds.  Pulmonary:     Effort: Pulmonary effort is normal.     Breath sounds: Normal breath sounds.  Abdominal:     General: Bowel sounds are normal. There is no distension.  Musculoskeletal:     Right lower leg: No edema.     Left lower leg: No edema.  Skin:    Capillary Refill: Capillary refill takes less  than 2 seconds.  Neurological:     Mental Status: He is oriented to person,  place, and time.  Psychiatric:        Mood and Affect: Mood normal.        Thought Content: Thought content normal.      Labs on Admission: I have personally reviewed following labs and imaging studies  CBC: Recent Labs  Lab 12/05/22 1417  WBC 8.3  HGB 15.6  HCT 46.1  MCV 90.4  PLT 199   Basic Metabolic Panel: Recent Labs  Lab 12/05/22 1555  NA 135  K 4.0  CL 102  CO2 21*  GLUCOSE 144*  BUN 21  CREATININE 1.45*  CALCIUM 9.3   GFR: Estimated Creatinine Clearance: 40 mL/min (A) (by C-G formula based on SCr of 1.45 mg/dL (H)). Liver Function Tests: Recent Labs  Lab 12/05/22 1555  AST 24  ALT 22  ALKPHOS 46  BILITOT 0.3  PROT 7.3  ALBUMIN 3.7   No results for input(s): "LIPASE", "AMYLASE" in the last 168 hours. No results for input(s): "AMMONIA" in the last 168 hours. Coagulation Profile: Recent Labs  Lab 12/05/22 2220  INR 2.0*   Cardiac Enzymes: Recent Labs  Lab 12/05/22 1417 12/05/22 1555  TROPONINIHS 15 19*   BNP (last 3 results) Recent Labs    07/11/22 0941 07/24/22 1501  BNP 201.7* 79.8   HbA1C: No results for input(s): "HGBA1C" in the last 72 hours. CBG: No results for input(s): "GLUCAP" in the last 168 hours. Lipid Profile: No results for input(s): "CHOL", "HDL", "LDLCALC", "TRIG", "CHOLHDL", "LDLDIRECT" in the last 72 hours. Thyroid Function Tests: No results for input(s): "TSH", "T4TOTAL", "FREET4", "T3FREE", "THYROIDAB" in the last 72 hours. Anemia Panel: No results for input(s): "VITAMINB12", "FOLATE", "FERRITIN", "TIBC", "IRON", "RETICCTPCT" in the last 72 hours. Urine analysis:    Component Value Date/Time   COLORURINE YELLOW 12/05/2022 1417   APPEARANCEUR HAZY (A) 12/05/2022 1417   LABSPEC 1.019 12/05/2022 1417   PHURINE 5.0 12/05/2022 1417   GLUCOSEU NEGATIVE 12/05/2022 1417   HGBUR MODERATE (A) 12/05/2022 1417   BILIRUBINUR NEGATIVE  12/05/2022 1417   BILIRUBINUR neg 05/17/2020 1017   KETONESUR NEGATIVE 12/05/2022 1417   PROTEINUR NEGATIVE 12/05/2022 1417   UROBILINOGEN 0.2 05/17/2020 1017   UROBILINOGEN 0.2 05/06/2012 1726   NITRITE POSITIVE (A) 12/05/2022 1417   LEUKOCYTESUR TRACE (A) 12/05/2022 1417    Radiological Exams on Admission: I have personally reviewed images DG Chest 2 View  Result Date: 12/05/2022 CLINICAL DATA:  Chest pain.  Shortness of breath. EXAM: CHEST - 2 VIEW COMPARISON:  07/05/2022. FINDINGS: Bilateral lung fields are clear. Bilateral costophrenic angles are clear. Normal cardio-mediastinal silhouette. There is a left sided 2-lead pacemaker. Sternotomy wires noted. No acute osseous abnormalities. The soft tissues are within normal limits. IMPRESSION: *No active cardiopulmonary disease. Electronically Signed   By: Jules Schick M.D.   On: 12/05/2022 15:21    EKG: My personal interpretation of EKG shows: EKG showing sinus tachycardia heart rate 100.  Premature ventricular complex.    Assessment/Plan: Principal Problem:   Acute cystitis Active Problems:   Generalized weakness   Hyperlipidemia   Essential hypertension   Ischemic cardiomyopathy   Non-insulin dependent type 2 diabetes mellitus (HCC)   ICD (implantable cardioverter-defibrillator) in place   Left ventricular thrombus   History of CAD (coronary artery disease)   Combined congestive systolic and diastolic heart failure (HCC)   CKD stage 3b, GFR 30-44 ml/min (HCC)   BPH (benign prostatic hyperplasia)    Assessment and Plan: Acute cystitis -Patient presenting with complaint of generalized weakness and  increased urinary urgency and frequency.  Afebrile on presentation.  No leukocytosis. - UA hazy appearance, hemoglobin positive, nitrate positive, trace leukocyte esterase and rare bacteria. -Concern for UTI in the ED patient received ceftriaxone 2 g IV once. -Continue ceftriaxone IV 1 g daily.  -In the ED blood culture and  urine culture has been obtained.  Will follow-up with urine culture result for appropriate antibiotic guidance. -Admitted for observation overnight.  Generalized fatigue/weakness Patient reported history of chronic generalized fatigue. -General fatigue/weakness in the setting of UTI.  Patient can be discharged with oral Keflex for the management of UTI however due to worsening generalized weakness admitted for observation overnight.  -Consulted inpatient PT and OT for evaluation in the daytime.  History of CAD status post CABG -Continue statin and aspirin  Combined systolic and diastolic heart failure EF 30 to 35% Ischemic cardiomyopathy status post ICD in place Essential hypertension - Per chart review patient off Coreg due to fatigue in the past.  Off losartan due to low blood pressure.  Currently on spironolactone 12.5 mg at bedtime.  Previously Marcelline Deist has been stopped due to history of recurrent UTI - Continue spironolactone 12.5 mg daily. -Continue to monitor blood pressure.  For lipidemia - Continue Lipitor  Left ventricular apical thrombus - Pro time 22.6 and INR 2.  Consulted pharmacy to continue Coumadin.  Patient follows outpatient Coumadin clinic.  Non-insulin-dependent DM type II -Holding metformin while in the hospital - Continue carb modified diet, sliding scale insulin and at bedtime insulin as needed with POC blood glucose check.   CKD stage IIIb -Creatinine 1.45 and GFR 46.  Renal function at baseline.    History of BPH -Continue Flomax.  DVT prophylaxis:  Coumadin reported Code Status:  Full Code.  Verified with patient at the bedside.  Patient reported if my heart stop do not just let the ICD to shocked me.  Do chest compression and do intubation and mechanical ventilator if needed. Diet: Heart healthy and carb modified Family Communication: No family member at the bedside Disposition Plan: Tentative discharge to nursing home within next 2 days Consults: PT  and OT Admission status:   Observation, Telemetry bed  Severity of Illness: The appropriate patient status for this patient is OBSERVATION. Observation status is judged to be reasonable and necessary in order to provide the required intensity of service to ensure the patient's safety. The patient's presenting symptoms, physical exam findings, and initial radiographic and laboratory data in the context of their medical condition is felt to place them at decreased risk for further clinical deterioration. Furthermore, it is anticipated that the patient will be medically stable for discharge from the hospital within 2 midnights of admission.     Tereasa Coop, MD [facility]  How to contact the [facility] Attending or Consulting provider 7A - 7P or covering provider during after hours 7P -7A, for this patient.  Check the care team in [facility] and look for a) attending/consulting [facility] provider listed and b) the [facility] team listed Log into [email or web address] and use [facility]'s universal password to access. If you do not have the password, please contact the hospital operator. Locate the [facility] provider you are looking for under [facility] and page to a number that you can be directly reached. If you still have difficulty reaching the provider, please page the [facility] (Director on Call) for the Hospitalists listed on amion for assistance.  12/06/2022, 12:29 AM

## 2022-12-05 NOTE — ED Provider Notes (Signed)
  Physical Exam  BP 138/75   Pulse (!) 104   Temp 100.2 F (37.9 C) (Oral)   Resp 16   Ht 5\' 11"  (1.803 m)   Wt 95.7 kg   SpO2 95%   BMI 29.43 kg/m   Physical Exam as below in MDM  Procedures  Procedures  ED Course / MDM   Medical Decision Making Amount and/or Complexity of Data Reviewed Labs: ordered. Decision-making details documented in ED Course. Radiology: ordered. Decision-making details documented in ED Course.  Risk Decision regarding hospitalization.   From SNF, 7M with generalized weakness, slid out of bed. Baseline A&Ox4, ambulatory. Says it feels like when he had a UTI. Labs, CXR pending. EKG nonischemic. F/u labs, if normal try to ambulate  Labs resulted with no leukocytosis, normal hemoglobin, normal electrolytes, renal function near baseline, Trope 15, 19 no delta, EKG nonischemic, chest x-ray no acute pathology, UA concerning for UTI with nitrite leuk esterase and bacteria.  Culture was sent.  I personally reviewed his previous culture data which shows pansensitive [name].  I personally reassessed the patient and he is lying in bed with urinary incontinence.  He states he is unable to sit up due to global weakness.  His neuroexam is normal.  Because he could not sit up or get out of bed he would not be able to care for himself at home and requires admission for management of UTI.  Urine culture, blood cultures were sent.  Ceftriaxone given. Patient was then admitted to hospitalist for further care.       Karmen Stabs, MD 12/05/22 2245    Glyn Ade, MD 12/05/22 [phone]

## 2022-12-05 NOTE — ED Triage Notes (Signed)
PT BIB [facility] after experiencing weakness, fatigue and 'sliding' from the bed to the floor this morning. Denies fall or LOC. Also endorses cough and L sided CP. States he took cough syrup this morning, but none of his daily Rx meds. Says symptoms similar to past UTI experienced. [facility] vitals: T98, BP 130/60, HR 106, Spo2: 98% RR 17. Aox4.

## 2022-12-05 NOTE — Progress Notes (Addendum)
ANTICOAGULATION CONSULT NOTE - Initial Consult  Pharmacy Consult for Warfarin Indication:  LV Thrombus  Allergies  Allergen Reactions   Penicillins Rash and Other (See Comments)    Because of a history of documented adverse serious drug reaction;Medi Alert bracelet  is recommended Has patient had a PCN reaction causing immediate rash, facial/tongue/throat swelling, SOB or lightheadedness with hypotension: No Has patient had a PCN reaction causing severe rash involving mucus membranes or skin necrosis: No Has patient had a PCN reaction that required hospitalization: No Has patient had a PCN reaction occurring within the last 10 years: No If all of the above answers are "NO", t    Patient Measurements: Height: 5\' 11"  (180.3 cm) Weight: 95.7 kg (211 lb) IBW/kg (Calculated) : 75.3  Vital Signs: Temp: 98.7 F (37.1 C) (10/25 1802) Temp Source: Oral (10/25 1802) BP: 170/103 (10/25 1830) Pulse Rate: 25 (10/25 1830)  Labs: Recent Labs    12/05/22 1417 12/05/22 1555  HGB 15.6  --   HCT 46.1  --   PLT 199  --   CREATININE  --  1.45*  TROPONINIHS 15 19*    Estimated Creatinine Clearance: 40 mL/min (A) (by C-G formula based on SCr of 1.45 mg/dL (H)).   Medical History: Past Medical History:  Diagnosis Date   Anxiety    BPH (benign prostatic hypertrophy)    CAD (coronary artery disease)    s/p CABG   CHF (congestive heart failure) (HCC)    Diabetes 1.5, managed as type 2 (HCC)    Diverticulosis    Diverticulosis of colon without hemorrhage 10/22/2007   Qualifier: Diagnosis of  By: Alwyn Ren MD, Chrissie Noa     DOE (dyspnea on exertion)    Excess weight    Fasting hyperglycemia    Generalized weakness    GERD (gastroesophageal reflux disease)    Heart attack (HCC) 2001   Heart failure    CHF due to ischemic CM   History of kidney stones    Hyperlipidemia    Hypertension    Ischemic cardiomyopathy    Microscopic hematuria    [facility]   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 Patient is presenting with weakness and c/f UTI. Warfarin per pharmacy consult placed for  LV Thrombus .  Patient taking warfarin prior to arrival. Home dose is 2.5 mg (5 mg x 0.5) every Mon; 5 mg (5 mg x 1) all other days. It is uncertain when patient took last due to patient unable to tell me.  PT / INR today is 22.6 / 2, which is borderline therapeutic Hgb 15.6; plt 199  Goal of Therapy:  INR Goal 2-3 Monitor platelets by anticoagulation protocol: Yes   Plan:  Give 5mg  warfarin tonight Repeat dosing per INR Monitor for s/s of hemorrhage, daily  INR, CBC Watch for new DDIs  Delmar Landau, PharmD, BCPS 12/05/2022 8:57 PM ED Clinical Pharmacist -  [phone]

## 2022-12-06 DIAGNOSIS — U071 COVID-19: Secondary | ICD-10-CM | POA: Diagnosis not present

## 2022-12-06 LAB — RESP PANEL BY RT-PCR (RSV, FLU A&B, COVID)  RVPGX2
Influenza A by PCR: NEGATIVE
Influenza B by PCR: NEGATIVE
Resp Syncytial Virus by PCR: NEGATIVE
SARS Coronavirus 2 by RT PCR: POSITIVE — AB

## 2022-12-06 LAB — PROTIME-INR
INR: 1.9 — ABNORMAL HIGH (ref 0.8–1.2)
Prothrombin Time: 22.3 s — ABNORMAL HIGH (ref 11.4–15.2)

## 2022-12-06 LAB — COMPREHENSIVE METABOLIC PANEL
ALT: 21 U/L (ref 0–44)
ALT: 22 U/L (ref 0–44)
AST: 23 U/L (ref 15–41)
AST: 33 U/L (ref 15–41)
Albumin: 3.3 g/dL — ABNORMAL LOW (ref 3.5–5.0)
Albumin: 3.3 g/dL — ABNORMAL LOW (ref 3.5–5.0)
Alkaline Phosphatase: 43 U/L (ref 38–126)
Alkaline Phosphatase: 46 U/L (ref 38–126)
Anion gap: 12 (ref 5–15)
Anion gap: 15 (ref 5–15)
BUN: 21 mg/dL (ref 8–23)
BUN: 22 mg/dL (ref 8–23)
CO2: 21 mmol/L — ABNORMAL LOW (ref 22–32)
CO2: 18 mmol/L — ABNORMAL LOW (ref 22–32)
Calcium: 8.8 mg/dL — ABNORMAL LOW (ref 8.9–10.3)
Calcium: 8.8 mg/dL — ABNORMAL LOW (ref 8.9–10.3)
Chloride: 102 mmol/L (ref 98–111)
Chloride: 100 mmol/L (ref 98–111)
Creatinine, Ser: 1.2 mg/dL (ref 0.61–1.24)
Creatinine, Ser: 1.47 mg/dL — ABNORMAL HIGH (ref 0.61–1.24)
GFR, Estimated: 57 mL/min — ABNORMAL LOW (ref >60)
GFR, Estimated: 45 mL/min — ABNORMAL LOW (ref >60)
Glucose, Bld: 184 mg/dL — ABNORMAL HIGH (ref 70–99)
Glucose, Bld: 219 mg/dL — ABNORMAL HIGH (ref 70–99)
Potassium: 3.5 mmol/L (ref 3.5–5.1)
Potassium: 3.8 mmol/L (ref 3.5–5.1)
Sodium: 135 mmol/L (ref 135–145)
Sodium: 133 mmol/L — ABNORMAL LOW (ref 135–145)
Total Bilirubin: 0.6 mg/dL (ref 0.3–1.2)
Total Bilirubin: 0.4 mg/dL (ref 0.3–1.2)
Total Protein: 6.8 g/dL (ref 6.5–8.1)
Total Protein: 7 g/dL (ref 6.5–8.1)

## 2022-12-06 LAB — CBC
HCT: 42.1 % (ref 39.0–52.0)
HCT: 44.9 % (ref 39.0–52.0)
Hemoglobin: 14.3 g/dL (ref 13.0–17.0)
Hemoglobin: 15.2 g/dL (ref 13.0–17.0)
MCH: 30.5 pg (ref 26.0–34.0)
MCH: 30.7 pg (ref 26.0–34.0)
MCHC: 34 g/dL (ref 30.0–36.0)
MCHC: 33.9 g/dL (ref 30.0–36.0)
MCV: 89.8 fL (ref 80.0–100.0)
MCV: 90.7 fL (ref 80.0–100.0)
Platelets: 149 10*3/uL — ABNORMAL LOW (ref 150–400)
Platelets: 155 10*3/uL (ref 150–400)
RBC: 4.69 MIL/uL (ref 4.22–5.81)
RBC: 4.95 MIL/uL (ref 4.22–5.81)
RDW: 12.3 % (ref 11.5–15.5)
RDW: 12.2 % (ref 11.5–15.5)
WBC: 7.5 10*3/uL (ref 4.0–10.5)
WBC: 13.2 10*3/uL — ABNORMAL HIGH (ref 4.0–10.5)
nRBC: 0 % (ref 0.0–0.2)
nRBC: 0 % (ref 0.0–0.2)

## 2022-12-06 LAB — CBG MONITORING, ED
Glucose-Capillary: 128 mg/dL — ABNORMAL HIGH (ref 70–99)
Glucose-Capillary: 159 mg/dL — ABNORMAL HIGH (ref 70–99)
Glucose-Capillary: 184 mg/dL — ABNORMAL HIGH (ref 70–99)

## 2022-12-06 LAB — GLUCOSE, CAPILLARY
Glucose-Capillary: 184 mg/dL — ABNORMAL HIGH (ref 70–99)
Glucose-Capillary: 154 mg/dL — ABNORMAL HIGH (ref 70–99)

## 2022-12-06 LAB — LACTIC ACID, PLASMA: Lactic Acid, Venous: 1.2 mmol/L (ref 0.5–1.9)

## 2022-12-06 MED ORDER — FOLIC ACID 1 MG PO TABS
1.0000 mg | ORAL_TABLET | Freq: Every day | ORAL | Status: DC
Start: 2022-12-06 — End: 2022-12-10
  Administered 2022-12-06 – 2022-12-10 (×5): 1 mg via ORAL
  Filled 2022-12-06 (×5): qty 1

## 2022-12-06 MED ORDER — GUAIFENESIN ER 600 MG PO TB12
600.0000 mg | ORAL_TABLET | Freq: Two times a day (BID) | ORAL | Status: DC | PRN
  Administered 2022-12-07 (×2): 600 mg via ORAL
  Filled 2022-12-06 (×2): qty 1

## 2022-12-06 MED ORDER — MENTHOL 3 MG MT LOZG
1.0000 | LOZENGE | OROMUCOSAL | Status: DC | PRN
  Administered 2022-12-06: 3 mg via ORAL
  Filled 2022-12-06: qty 9

## 2022-12-06 MED ORDER — INSULIN ASPART 100 UNIT/ML IJ SOLN
0.0000 [IU] | Freq: Every day | INTRAMUSCULAR | Status: DC
  Administered 2022-12-07 – 2022-12-08 (×2): 2 [IU] via SUBCUTANEOUS
  Administered 2022-12-09: 4 [IU] via SUBCUTANEOUS

## 2022-12-06 MED ORDER — ADULT MULTIVITAMIN W/MINERALS CH
1.0000 | ORAL_TABLET | Freq: Every day | ORAL | Status: DC
Start: 2022-12-06 — End: 2022-12-10
  Administered 2022-12-06 – 2022-12-10 (×5): 1 via ORAL
  Filled 2022-12-06 (×5): qty 1

## 2022-12-06 MED ORDER — NIRMATRELVIR/RITONAVIR (PAXLOVID) TABLET (RENAL DOSING)
2.0000 | ORAL_TABLET | Freq: Two times a day (BID) | ORAL | Status: DC
  Administered 2022-12-06 – 2022-12-10 (×9): 2 via ORAL
  Filled 2022-12-06: qty 20

## 2022-12-06 MED ORDER — NIRMATRELVIR/RITONAVIR (PAXLOVID)TABLET: 3.0000 | ORAL_TABLET | Freq: Two times a day (BID) | ORAL | Status: DC

## 2022-12-06 MED ORDER — PHENOL 1.4 % MT LIQD
1.0000 | OROMUCOSAL | Status: DC | PRN
  Administered 2022-12-06 – 2022-12-07 (×2): 1 via OROMUCOSAL
  Filled 2022-12-06: qty 177

## 2022-12-06 MED ORDER — WARFARIN SODIUM 5 MG PO TABS
5.0000 mg | ORAL_TABLET | Freq: Once | ORAL | Status: AC
  Administered 2022-12-06: 5 mg via ORAL
  Filled 2022-12-06: qty 1

## 2022-12-06 MED ORDER — THIAMINE MONONITRATE 100 MG PO TABS
100.0000 mg | ORAL_TABLET | Freq: Every day | ORAL | Status: DC
  Administered 2022-12-06 – 2022-12-10 (×5): 100 mg via ORAL
  Filled 2022-12-06 (×5): qty 1

## 2022-12-06 MED ORDER — INSULIN ASPART 100 UNIT/ML IJ SOLN
0.0000 [IU] | Freq: Three times a day (TID) | INTRAMUSCULAR | Status: DC
Start: 2022-12-06 — End: 2022-12-10
  Administered 2022-12-06 (×3): 1 [IU] via SUBCUTANEOUS
  Administered 2022-12-07: 2 [IU] via SUBCUTANEOUS
  Administered 2022-12-08: 1 [IU] via SUBCUTANEOUS
  Administered 2022-12-08 (×2): 2 [IU] via SUBCUTANEOUS
  Administered 2022-12-09: 3 [IU] via SUBCUTANEOUS
  Administered 2022-12-09: 1 [IU] via SUBCUTANEOUS
  Administered 2022-12-09: 2 [IU] via SUBCUTANEOUS
  Administered 2022-12-10: 3 [IU] via SUBCUTANEOUS
  Administered 2022-12-10: 2 [IU] via SUBCUTANEOUS

## 2022-12-06 MED ORDER — NIRMATRELVIR/RITONAVIR (PAXLOVID)TABLET
3.0000 | ORAL_TABLET | Freq: Two times a day (BID) | ORAL | Status: DC
  Filled 2022-12-06: qty 30

## 2022-12-06 NOTE — Progress Notes (Signed)
  Sore throat and cough: Patient is complaining about sore throat and cough.  Patient is afebrile.  No leukocytosis.  Chest x-ray no acute cardiopulmonary disease. -Checking respiratory panel. -Continue Mucinex as needed -Continue Cepacol and Chloraseptic as needed.  Tereasa Coop, MD [facility] 12/06/2022, 5:03 AM

## 2022-12-06 NOTE — Progress Notes (Signed)
Progress Note   Patient: Joseph Malone ZOX:[id number] DOB: Feb 27, 1930 DOA: 12/05/2022     0 DOS: the patient was seen and examined on 12/06/2022   Brief hospital course: The patient is a 89 yr old man who states that he has been falling frequently in the last couple of days due to weakness. And when he falls, he is unable to get up by himself. This is new. He is also complaining of myalgias and throat pain. He also has had complaints of dysuria and frequent urination.   He was admitted for acute cystitis and generalized fatigue and weakness. However, shortly after admission he tested positive for COVID. He was started on paxlovid. While the patient was having fevers, cough, myalgias, and a sore throat, He was saturating 95% on room air, demonstrating no respiratory compromise at this time. He is, however, at high risk of decompensation due to his age and his multiple comorbidities. He will be closely monitored.  Assessment and Plan: Left ventricular thrombus INR 1.9 on admission. Will continue coumadin and monitor INR.  Ischemic cardiomyopathy Stable. The patient will be continued on his NTG SL 0.4 mg prn chest pain, Zocor, ASA 81 mg. He will be monitored on telemetry.  ICD (implantable cardioverter-defibrillator) in place Noted. The patient will be monitored on telemetry.  Hyperlipidemia Continue simvastatin as at home.  History of CAD (coronary artery disease) Continue ASA, NTG, Symvastatin, ASA 81 mg as at home. The patient will be monitored on telemetry.  Essential hypertension The patient is currently normotensive. Will hold antihypertensives at this time.  COVID-19 virus infection Although on 12/06/2022 the patient has no evidence of respiratory compromise, he is displaying symptoms of COVID infection with fevers, myalgias, sore throat, and fever. This is also likely behind the patient's complaints of weakness and frequent falls. He will be treated with paxlovid. He will be  closely monitored for respiratory issues. He is at high risk of decompensation due to his age and multiple cardiac comorbidities and DM II.  Combined congestive systolic and diastolic heart failure (HCC) Noted. The patient's most recent echocardiogram was in 07/2022. It demonstrated an EF of 30-35% with moderately decreased function and regional wall motion abnormalities. At this echo diastolic parameters were indeterminate. His right ventricular systolic function was mildly reduced. Will monitor the patient's volume status carefully.  CKD stage 3b, GFR 30-44 ml/min (HCC) Noted. The patient's renal status appears to be at baseline with a creatinine of 1.20 on admission. Monitor and avoid nephrotoxic agents.        Subjective: The patient is lying on the gurney in the ED. He is complaining of throat pain as well as myalgias and weakness. His daughter is on speaker on the patients phone throughout the visit. All questions were answered to the best of my ability.  Physical Exam: Vitals:   12/06/22 1709 12/06/22 1755 12/06/22 1800 12/06/22 1805  BP:  130/69    Pulse:      Resp:  20    Temp:    (!) 101.5 F (38.6 C)  TempSrc:    Oral  SpO2: 93% 94% 94% 96%  Weight:      Height:       Exam:  Constitutional:  The patient is awake, alert, and oriented x 3. No acute distress. Eyes:  pupils and irises appear normal Normal lids and conjunctivae Mild coryza ENMT:  grossly normal hearing  Lips appear normal external ears, nose appear normal Oropharynx: mucosa, tongue Posterior oropharynx is erythematous without  exudates  Neck:  neck appears normal, no masses, normal ROM, supple no thyromegaly Respiratory:  No increased work of breathing. No wheezes, rales, or rhonchi No tactile fremitus Cardiovascular:  Regular rate and rhythm No murmurs, ectopy, or gallups. No lateral PMI. No thrills. Abdomen:  Abdomen is soft, non-tender, non-distended No hernias, masses, or  organomegaly Normoactive bowel sounds.  Musculoskeletal:  No cyanosis, clubbing, or edema Skin:  No rashes, lesions, ulcers palpation of skin: no induration or nodules Neurologic:  CN 2-12 intact Sensation all 4 extremities intact Psychiatric:  Mental status Mood, affect appropriate Orientation to person, place, time  judgment and insight appear intact  Data Reviewed: CBC BMP COVID results. UA  Family Communication: The patient's daughter is on speaker on the patient's phone throughout my visit. All questions answered to the best of my ability.  Disposition: Status is: Inpatient admission The patient will require care spanning > 2 midnights and should be moved to inpatient because: severity of infection, multiple comorbidities, and advanced age  Planned Discharge Destination: Home    Time spent: 38 minutes  Author: Avonne Berkery, DO 12/06/2022 6:54 PM  For on call review [email or web address].

## 2022-12-06 NOTE — Evaluation (Signed)
Occupational Therapy Evaluation Patient Details Name: Joseph Malone MRN: [id number] DOB: [birth year] Today's Date: 12/06/2022   History of Present Illness Pt is a 89 y/o male presenting with weakness and fall from bed. Admitted for UTI, acute cystitis and found to be COVID+. PMH: CAD s/p CABG x 1, ICM, CHF, ischemic cardiomyopathy, ICD, LV thrombus, HTN, HLD, DM, CKD, BPH and pulmonary nodules.   Clinical Impression   PTA, pt reports living alone in apartment, typically ambulatory with cane and reports able to manage ADLs/basic IADLs (including med mgmt) with occasional assist from family. Pt presents now with deficits in sitting balance, strength, endurance and cognition. Pt requires Max A for bed mobility and unable to correct heavy posterior lean to advance OOB this AM. Pt requires Mod A for UB ADLs and up to Total A for LB ADLs bed level due to deficits. Based on current abilities, recommend consideration of continued inpatient follow up therapy, <3 hours/day at DC.      If plan is discharge home, recommend the following: A lot of help with walking and/or transfers;Two people to help with walking and/or transfers;A lot of help with bathing/dressing/bathroom    Functional Status Assessment  Patient has had a recent decline in their functional status and demonstrates the ability to make significant improvements in function in a reasonable and predictable amount of time.  Equipment Recommendations  Other (comment) (TBD pending progress)    Recommendations for Other Services       Precautions / Restrictions Precautions Precautions: Fall Restrictions Weight Bearing Restrictions: No      Mobility Bed Mobility Overal bed mobility: Needs Assistance Bed Mobility: Supine to Sit, Sit to Supine     Supine to sit: Max assist, HOB elevated Sit to supine: Max assist   General bed mobility comments: heavy max assist to bring LE off of stretcher (increased difficulty with LLE) and heavy  assist to lift trunk though unable to correct posterior bias. stretcher mattress noted to be sliding as well so returned to supine for safety. Total A to scoot up on stretcher    Transfers                   General transfer comment: unable to attempt d/t poor sitting balance      Balance Overall balance assessment: Needs assistance Sitting-balance support: Bilateral upper extremity supported, Feet supported Sitting balance-Leahy Scale: Zero Sitting balance - Comments: Max A w/ heavy posterior lean. difficulty leaning forward even with multimodal cues Postural control: Posterior lean                                 ADL either performed or assessed with clinical judgement   ADL Overall ADL's : Needs assistance/impaired Eating/Feeding: Set up;Supervision/ safety;Bed level Eating/Feeding Details (indicate cue type and reason): noted juice spilled on gown and stretcher sheets, milk carton on floor. per nurse, pt eating with hands despite utensils provided. Grooming: Minimal assistance;Bed level   Upper Body Bathing: Moderate assistance;Bed level   Lower Body Bathing: Total assistance;Bed level   Upper Body Dressing : Bed level;Moderate assistance Upper Body Dressing Details (indicate cue type and reason): changing soiled gown bed level Lower Body Dressing: Total assistance;Bed level Lower Body Dressing Details (indicate cue type and reason): sock mgmt     Toileting- Clothing Manipulation and Hygiene: Total assistance;Bed level               Vision  Ability to See in Adequate Light: 0 Adequate Patient Visual Report: No change from baseline Vision Assessment?: No apparent visual deficits     Perception         Praxis         Pertinent Vitals/Pain Pain Assessment Pain Assessment: No/denies pain     Extremity/Trunk Assessment Upper Extremity Assessment Upper Extremity Assessment: Generalized weakness;Right hand dominant   Lower Extremity  Assessment Lower Extremity Assessment: Defer to PT evaluation   Cervical / Trunk Assessment Cervical / Trunk Assessment: Kyphotic   Communication Communication Communication: No apparent difficulties   Cognition Arousal: Alert Behavior During Therapy: WFL for tasks assessed/performed Overall Cognitive Status: Impaired/Different from baseline Area of Impairment: Safety/judgement, Awareness, Problem solving                         Safety/Judgement: Decreased awareness of safety, Decreased awareness of deficits Awareness: Emergent, Intellectual Problem Solving: Decreased initiation, Difficulty sequencing, Requires tactile cues, Requires verbal cues General Comments: Pt able to answer orientation questions, respond appropriately to questions though some decreased insight into needs and when assist needed, safety and impaired problem solving. per nurse, pt eating breakfast with hands, eating applesauce with straw on OT entry.     General Comments       Exercises     Shoulder Instructions      Home Living Family/patient expects to be discharged to:: Private residence Living Arrangements: Alone Available Help at Discharge: Family;Available PRN/intermittently Type of Home: Apartment Home Access: Level entry     Home Layout: One level     Bathroom Shower/Tub: Chief Strategy Officer: Handicapped height     Home Equipment: Agricultural consultant (2 wheels);Cane - single point          Prior Functioning/Environment Prior Level of Function : Independent/Modified Independent;Patient poor historian/Family not available             Mobility Comments: cane vs RW use but prefers cane ADLs Comments: reports able to dress self "most of the time" but daughter will assist, reports standing for showers. daughter will assist with meals, grocery shopping as needed. pt reports managing own meds with pill box        OT Problem List: Decreased strength;Decreased  activity tolerance;Impaired balance (sitting and/or standing);Decreased cognition;Decreased safety awareness      OT Treatment/Interventions: Self-care/ADL training;Therapeutic exercise;Energy conservation;DME and/or AE instruction;Therapeutic activities;Patient/family education;Balance training    OT Goals(Current goals can be found in the care plan section) Acute Rehab OT Goals Patient Stated Goal: be able to stand and walk before going home OT Goal Formulation: With patient Time For Goal Achievement: 12/20/22 Potential to Achieve Goals: Good ADL Goals Pt Will Perform Lower Body Dressing: with mod assist;sitting/lateral leans;sit to/from stand Pt Will Transfer to Toilet: with min assist;stand pivot transfer;bedside commode Additional ADL Goal #1: Pt to complete bed mobility with Min A in prep for EOB/OOB ADLs  OT Frequency: Min 1X/week    Co-evaluation              AM-PAC OT "6 Clicks" Daily Activity     Outcome Measure Help from another person eating meals?: A Little Help from another person taking care of personal grooming?: A Little Help from another person toileting, which includes using toliet, bedpan, or urinal?: Total Help from another person bathing (including washing, rinsing, drying)?: A Lot Help from another person to put on and taking off regular upper body clothing?: A Lot Help from another  person to put on and taking off regular lower body clothing?: Total 6 Click Score: 12   End of Session Nurse Communication: Mobility status;Other (comment) (need for new linens in room)  Activity Tolerance: Patient tolerated treatment well Patient left: in bed;with call bell/phone within reach  OT Visit Diagnosis: Other abnormalities of gait and mobility (R26.89);Unsteadiness on feet (R26.81);Muscle weakness (generalized) (M62.81)                Time: 8295-6213 OT Time Calculation (min): 24 min Charges:  OT General Charges $OT Visit: 1 Visit OT Evaluation $OT Eval  Moderate Complexity: 1 Mod OT Treatments $Self Care/Home Management : 8-22 mins  Bradd Canary, OTR/L Acute Rehab Services Office: [phone]   Lorre Munroe 12/06/2022, 10:17 AM

## 2022-12-06 NOTE — ED Notes (Signed)
ED TO INPATIENT HANDOFF REPORT  ED Nurse Name and Phone #: Vernona Rieger [phone]  S Name/Age/Gender Joseph Malone 89 y.o. male Room/Bed: 042C/042C  Code Status   Code Status: Full Code  Home/SNF/Other Skilled nursing facility Patient oriented to: self, place, and situation Is this baseline? Yes   Triage Complete: Triage complete  Chief Complaint Acute cystitis [N30.00]  Triage Note PT BIB [facility] after experiencing weakness, fatigue and 'sliding' from the bed to the floor this morning. Denies fall or LOC. Also endorses cough and L sided CP. States he took cough syrup this morning, but none of his daily Rx meds. Says symptoms similar to past UTI experienced. [facility] vitals: T98, BP 130/60, HR 106, Spo2: 98% RR 17. Aox4.   Allergies Allergies  Allergen Reactions   Penicillins Rash and Other (See Comments)    Because of a history of documented adverse serious drug reaction;Medi Alert bracelet  is recommended Has patient had a PCN reaction causing immediate rash, facial/tongue/throat swelling, SOB or lightheadedness with hypotension: No Has patient had a PCN reaction causing severe rash involving mucus membranes or skin necrosis: No Has patient had a PCN reaction that required hospitalization: No Has patient had a PCN reaction occurring within the last 10 years: No If all of the above answers are "NO", t    Level of Care/Admitting Diagnosis ED Disposition     ED Disposition  Admit   Condition  --   Comment  Hospital Area: [facility] [[id number]]  Level of Care: Telemetry Cardiac [103]  May place patient in observation at [facility] or [facility] if equivalent level of care is available:: No  Covid Evaluation: Asymptomatic - no recent exposure (last 10 days) testing not required  Diagnosis: Acute cystitis [595.0.ICD-9-CM]  Admitting Physician: Tereasa Coop [[id number]]  Attending Physician: Tereasa Coop [[id number]]          B Medical/Surgery History Past  Medical History:  Diagnosis Date   Anxiety    BPH (benign prostatic hypertrophy)    CAD (coronary artery disease)    s/p CABG   CHF (congestive heart failure) (HCC)    Diabetes 1.5, managed as type 2 (HCC)    Diverticulosis    Diverticulosis of colon without hemorrhage 10/22/2007   Qualifier: Diagnosis of  By: Alwyn Ren MD, Chrissie Noa     DOE (dyspnea on exertion)    Excess weight    Fasting hyperglycemia    Generalized weakness    GERD (gastroesophageal reflux disease)    Heart attack (HCC) 2001   Heart failure    CHF due to ischemic CM   History of kidney stones    Hyperlipidemia    Hypertension    Ischemic cardiomyopathy    Microscopic hematuria    [facility]   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   Past Surgical History:  Procedure Laterality Date   APPENDECTOMY     BIOPSY  10/21/2018   Procedure: BIOPSY;  Surgeon: Benancio Deeds, MD;  Location: [facility];  Service: Gastroenterology;;   CARDIAC CATHETERIZATION  2000   CARDIAC DEFIBRILLATOR PLACEMENT  2005   [employer]; s/p ICD gen change and RV lead revision April 2015   CATARACT EXTRACTION  2008   bilateral with lens implant   COLONOSCOPY  2004   Tics; [facility]   COLONOSCOPY WITH PROPOFOL N/A 01/16/2022   Procedure: COLONOSCOPY WITH PROPOFOL;  Surgeon: Napoleon Form, MD;  Location: [facility];  Service: Gastroenterology;  Laterality: N/A;   CORONARY  ARTERY BYPASS GRAFT  2000   1 vessel   CYSTOSCOPY  06/2012   Dr Margarita Grizzle   ESOPHAGOGASTRODUODENOSCOPY (EGD) WITH PROPOFOL N/A 10/21/2018   Procedure: ESOPHAGOGASTRODUODENOSCOPY (EGD) WITH PROPOFOL;  Surgeon: Benancio Deeds, MD;  Location: [facility];  Service: Gastroenterology;  Laterality: N/A;   HOT HEMOSTASIS N/A 10/21/2018   Procedure: HOT HEMOSTASIS (ARGON PLASMA COAGULATION/BICAP);  Surgeon: Benancio Deeds, MD;  Location: [facility];  Service: Gastroenterology;  Laterality: N/A;   IMPLANTABLE CARDIOVERTER DEFIBRILLATOR (ICD)  GENERATOR CHANGE N/A 06/01/2013   Procedure: ICD GENERATOR CHANGE;  Surgeon: Marinus Maw, MD;  Location[facility];  Service: Cardiovascular;  Laterality: N/A;   LEAD REVISION N/A 06/01/2013   Procedure: LEAD REVISION;  Surgeon: Marinus Maw, MD;  Location: [facility] CATH LAB;  Service: Cardiovascular;  Laterality: N/A;   PILONIDAL CYST EXCISION     POLYPECTOMY  01/16/2022   Procedure: POLYPECTOMY;  Surgeon: Napoleon Form, MD;  Location: [facility];  Service: Gastroenterology;;   RIGHT/LEFT HEART CATH AND CORONARY/GRAFT ANGIOGRAPHY N/A 06/12/2017   Procedure: RIGHT/LEFT HEART CATH AND CORONARY/GRAFT ANGIOGRAPHY;  Surgeon: Dolores Patty, MD;  Location: [facility] INVASIVE CV LAB;  Service: Cardiovascular;  Laterality: N/A;   TRANSTHORACIC ECHOCARDIOGRAM  08/29/2005     A IV Location/Drains/Wounds Patient Lines/Drains/Airways Status     Active Line/Drains/Airways     Name Placement date Placement time Site Days   Peripheral IV 12/05/22 20 G Left Antecubital 12/05/22  2138  Antecubital  1            Intake/Output Last 24 hours  Intake/Output Summary (Last 24 hours) at 12/06/2022 1201 Last data filed at 12/06/2022 0009 Gross per 24 hour  Intake 100 ml  Output --  Net 100 ml    Labs/Imaging Results for orders placed or performed during the hospital encounter of 12/05/22 (from the past 48 hour(s))  CBC     Status: None   Collection Time: 12/05/22  2:17 PM  Result Value Ref Range   WBC 8.3 4.0 - 10.5 K/uL   RBC 5.10 4.22 - 5.81 MIL/uL   Hemoglobin 15.6 13.0 - 17.0 g/dL   HCT 81.1 91.4 - 78.2 %   MCV 90.4 80.0 - 100.0 fL   MCH 30.6 26.0 - 34.0 pg   MCHC 33.8 30.0 - 36.0 g/dL   RDW 95.6 21.3 - 08.6 %   Platelets 199 150 - 400 K/uL   nRBC 0.0 0.0 - 0.2 %    Comment: Performed at [facility], [address], [place], [place] [address]  Troponin I (High Sensitivity)     Status: None   Collection Time: 12/05/22  2:17 PM  Result Value Ref Range   Troponin I (High  Sensitivity) 15 <18 ng/L    Comment: (NOTE) Elevated high sensitivity troponin I (hsTnI) values and significant  changes across serial measurements may suggest ACS but many other  chronic and acute conditions are known to elevate hsTnI results.  Refer to the "Links" section for chest pain algorithms and additional  guidance. Performed at [facility], [address], [place], [place] [address]   Urinalysis, Routine w reflex microscopic -Urine, Clean Catch     Status: Abnormal   Collection Time: 12/05/22  2:17 PM  Result Value Ref Range   Color, Urine YELLOW YELLOW   APPearance HAZY (A) CLEAR   Specific Gravity, Urine 1.019 1.005 - 1.030   pH 5.0 5.0 - 8.0   Glucose, UA NEGATIVE NEGATIVE  mg/dL   Hgb urine dipstick MODERATE (A) NEGATIVE   Bilirubin Urine NEGATIVE NEGATIVE   Ketones, ur NEGATIVE NEGATIVE mg/dL   Protein, ur NEGATIVE NEGATIVE mg/dL   Nitrite POSITIVE (A) NEGATIVE   Leukocytes,Ua TRACE (A) NEGATIVE   RBC / HPF 0-5 0 - 5 RBC/hpf   WBC, UA 0-5 0 - 5 WBC/hpf   Bacteria, UA RARE (A) NONE SEEN   Squamous Epithelial / HPF 0-5 0 - 5 /HPF   Mucus PRESENT     Comment: Performed at [facility], [address], [place], [place] [address]  Troponin I (High Sensitivity)     Status: Abnormal   Collection Time: 12/05/22  3:55 PM  Result Value Ref Range   Troponin I (High Sensitivity) 19 (H) <18 ng/L    Comment: (NOTE) Elevated high sensitivity troponin I (hsTnI) values and significant  changes across serial measurements may suggest ACS but many other  chronic and acute conditions are known to elevate hsTnI results.  Refer to the "Links" section for chest pain algorithms and additional  guidance. Performed at [facility], [address] [place], [place] [address]   Comprehensive metabolic panel     Status: Abnormal   Collection Time: 12/05/22  3:55 PM  Result Value Ref Range   Sodium 135 135 - 145 mmol/L   Potassium 4.0 3.5 - 5.1 mmol/L   Chloride 102  98 - 111 mmol/L   CO2 21 (L) 22 - 32 mmol/L   Glucose, Bld 144 (H) 70 - 99 mg/dL    Comment: Glucose reference range applies only to samples taken after fasting for at least 8 hours.   BUN 21 8 - 23 mg/dL   Creatinine, Ser 0.98 (H) 0.61 - 1.24 mg/dL   Calcium 9.3 8.9 - 11.9 mg/dL   Total Protein 7.3 6.5 - 8.1 g/dL   Albumin 3.7 3.5 - 5.0 g/dL   AST 24 15 - 41 U/L   ALT 22 0 - 44 U/L   Alkaline Phosphatase 46 38 - 126 U/L   Total Bilirubin 0.3 0.3 - 1.2 mg/dL   GFR, Estimated 46 (L) >60 mL/min    Comment: (NOTE) Calculated using the CKD-EPI Creatinine Equation (2021)    Anion gap 12 5 - 15    Comment: Performed at [facility], [address] [place], [place] [address]  Blood culture (routine x 2)     Status: None (Preliminary result)   Collection Time: 12/05/22  9:20 PM   Specimen: BLOOD RIGHT HAND  Result Value Ref Range   Specimen Description BLOOD RIGHT HAND    Special Requests      BOTTLES DRAWN AEROBIC AND ANAEROBIC Blood Culture adequate volume   Culture      NO GROWTH < 12 HOURS Performed at [facility], [address] [place], [place] [address]    Report Status PENDING   Blood culture (routine x 2)     Status: None (Preliminary result)   Collection Time: 12/05/22  9:39 PM   Specimen: BLOOD LEFT ARM  Result Value Ref Range   Specimen Description BLOOD LEFT ARM    Special Requests      BOTTLES DRAWN AEROBIC AND ANAEROBIC Blood Culture results may not be optimal due to an excessive volume of blood received in culture bottles   Culture      NO GROWTH < 12 HOURS Performed at [facility], [address] [place], [place] [address]  Report Status PENDING   Protime-INR     Status: Abnormal   Collection Time: 12/05/22 10:20 PM  Result Value Ref Range   Prothrombin Time 22.6 (H) 11.4 - 15.2 seconds   INR 2.0 (H) 0.8 - 1.2    Comment: (NOTE) INR goal varies based on device and disease states. Performed at [facility], [address]  [place], [place] [address]   CBG monitoring, ED     Status: Abnormal   Collection Time: 12/06/22  1:10 AM  Result Value Ref Range   Glucose-Capillary 128 (H) 70 - 99 mg/dL    Comment: Glucose reference range applies only to samples taken after fasting for at least 8 hours.  Comprehensive metabolic panel     Status: Abnormal   Collection Time: 12/06/22  2:43 AM  Result Value Ref Range   Sodium 135 135 - 145 mmol/L   Potassium 3.5 3.5 - 5.1 mmol/L   Chloride 102 98 - 111 mmol/L   CO2 21 (L) 22 - 32 mmol/L   Glucose, Bld 184 (H) 70 - 99 mg/dL    Comment: Glucose reference range applies only to samples taken after fasting for at least 8 hours.   BUN 21 8 - 23 mg/dL   Creatinine, Ser 6.04 0.61 - 1.24 mg/dL   Calcium 8.8 (L) 8.9 - 10.3 mg/dL   Total Protein 6.8 6.5 - 8.1 g/dL   Albumin 3.3 (L) 3.5 - 5.0 g/dL   AST 23 15 - 41 U/L   ALT 21 0 - 44 U/L   Alkaline Phosphatase 43 38 - 126 U/L   Total Bilirubin 0.6 0.3 - 1.2 mg/dL   GFR, Estimated 57 (L) >60 mL/min    Comment: (NOTE) Calculated using the CKD-EPI Creatinine Equation (2021)    Anion gap 12 5 - 15    Comment: Performed at [facility], [address] [place], [place] [address]  CBC     Status: Abnormal   Collection Time: 12/06/22  2:43 AM  Result Value Ref Range   WBC 7.5 4.0 - 10.5 K/uL   RBC 4.69 4.22 - 5.81 MIL/uL   Hemoglobin 14.3 13.0 - 17.0 g/dL   HCT 11.9 14.7 - 82.9 %   MCV 89.8 80.0 - 100.0 fL   MCH 30.5 26.0 - 34.0 pg   MCHC 34.0 30.0 - 36.0 g/dL   RDW 56.2 13.0 - 86.5 %   Platelets 149 (L) 150 - 400 K/uL   nRBC 0.0 0.0 - 0.2 %    Comment: Performed at [facility], [address] [place], [place] [address]  Protime-INR     Status: Abnormal   Collection Time: 12/06/22  2:43 AM  Result Value Ref Range   Prothrombin Time 22.3 (H) 11.4 - 15.2 seconds   INR 1.9 (H) 0.8 - 1.2    Comment: (NOTE) INR goal varies based on device and disease states. Performed at [facility], [address]  [place], [place] [address]   Resp panel by RT-PCR (RSV, Flu A&B, Covid) Anterior Nasal Swab     Status: Abnormal   Collection Time: 12/06/22  5:09 AM   Specimen: Anterior Nasal Swab  Result Value Ref Range   SARS Coronavirus 2 by RT PCR POSITIVE (A) NEGATIVE   Influenza A by PCR NEGATIVE NEGATIVE   Influenza B by PCR NEGATIVE NEGATIVE    Comment: (NOTE) The Xpert Xpress SARS-CoV-2/FLU/RSV plus assay is intended as an aid in the  diagnosis of influenza from Nasopharyngeal swab specimens and should not be used as a sole basis for treatment. Nasal washings and aspirates are unacceptable for Xpert Xpress SARS-CoV-2/FLU/RSV testing.  Fact Sheet for Patients: BloggerCourse.com  Fact Sheet for Healthcare Providers: SeriousBroker.it  This test is not yet approved or cleared by the [place] FDA and has been authorized for detection and/or diagnosis of SARS-CoV-2 by FDA under an Emergency Use Authorization (EUA). This EUA will remain in effect (meaning this test can be used) for the duration of the COVID-19 declaration under Section 564(b)(1) of the Act, 21 U.S.C. section 360bbb-3(b)(1), unless the authorization is terminated or revoked.     Resp Syncytial Virus by PCR NEGATIVE NEGATIVE    Comment: (NOTE) Fact Sheet for Patients: BloggerCourse.com  Fact Sheet for Healthcare Providers: SeriousBroker.it  This test is not yet approved or cleared by the [place] FDA and has been authorized for detection and/or diagnosis of SARS-CoV-2 by FDA under an Emergency Use Authorization (EUA). This EUA will remain in effect (meaning this test can be used) for the duration of the COVID-19 declaration under Section 564(b)(1) of the Act, 21 U.S.C. section 360bbb-3(b)(1), unless the authorization is terminated or revoked.  Performed at [facility], [address], [place],  [place] [address]   CBG monitoring, ED     Status: Abnormal   Collection Time: 12/06/22  8:00 AM  Result Value Ref Range   Glucose-Capillary 159 (H) 70 - 99 mg/dL    Comment: Glucose reference range applies only to samples taken after fasting for at least 8 hours.   DG Chest 2 View  Result Date: 12/05/2022 CLINICAL DATA:  Chest pain.  Shortness of breath. EXAM: CHEST - 2 VIEW COMPARISON:  07/05/2022. FINDINGS: Bilateral lung fields are clear. Bilateral costophrenic angles are clear. Normal cardio-mediastinal silhouette. There is a left sided 2-lead pacemaker. Sternotomy wires noted. No acute osseous abnormalities. The soft tissues are within normal limits. IMPRESSION: *No active cardiopulmonary disease. Electronically Signed   By: Jules Schick M.D.   On: 12/05/2022 15:21    Pending Labs Unresulted Labs (From admission, onward)     Start     Ordered   12/06/22 0500  Protime-INR  Daily,   R      12/05/22 2308   12/06/22 0500  CBC  Tomorrow morning,   R        12/06/22 0319   12/06/22 0500  Comprehensive metabolic panel  Tomorrow morning,   R        12/06/22 0319   12/05/22 2004  Urine Culture  Once,   URGENT       Question:  Indication  Answer:  Dysuria   12/05/22 2004            Vitals/Pain Today's Vitals   12/06/22 1041 12/06/22 1042 12/06/22 1100 12/06/22 1159  BP:   128/86   Pulse:   (!) 112   Resp:   (!) 23   Temp:  100.1 F (37.8 C)  98.6 F (37 C)  TempSrc:  Oral  Oral  SpO2:   94%   Weight:      Height:      PainSc: 5        Isolation Precautions Airborne and Contact precautions  Medications Medications  aspirin chewable tablet 81 mg (81 mg Oral Given 12/06/22 0006)  nitroGLYCERIN (NITROSTAT) SL tablet 0.4 mg (has no administration in time range)  simvastatin (ZOCOR) tablet 20 mg (20 mg Oral Given 12/06/22  0006)  spironolactone (ALDACTONE) tablet 12.5 mg (12.5 mg Oral Given 12/06/22 1037)  tamsulosin (FLOMAX) capsule 0.4 mg (0.4 mg Oral Given 12/06/22 0006)   melatonin tablet 5 mg (has no administration in time range)  cefTRIAXone (ROCEPHIN) 1 g in sodium chloride 0.9 % 100 mL IVPB (has no administration in time range)  sodium chloride flush (NS) 0.9 % injection 3 mL (3 mLs Intravenous Given 12/06/22 1038)  sodium chloride flush (NS) 0.9 % injection 3 mL (has no administration in time range)  0.9 %  sodium chloride infusion (has no administration in time range)  acetaminophen (TYLENOL) tablet 650 mg (650 mg Oral Given 12/06/22 1040)    Or  acetaminophen (TYLENOL) suppository 650 mg ( Rectal See Alternative 12/06/22 1040)  senna-docusate (Senokot-S) tablet 1 tablet (has no administration in time range)  Warfarin - Pharmacist Dosing Inpatient (has no administration in time range)  insulin aspart (novoLOG) injection 0-6 Units (1 Units Subcutaneous Given 12/06/22 0813)  insulin aspart (novoLOG) injection 0-5 Units ( Subcutaneous Not Given 12/06/22 0112)  guaiFENesin (MUCINEX) 12 hr tablet 600 mg (has no administration in time range)  menthol-cetylpyridinium (CEPACOL) lozenge 3 mg (3 mg Oral Given 12/06/22 0819)  phenol (CHLORASEPTIC) mouth spray 1 spray (1 spray Mouth/Throat Given 12/06/22 1039)  nirmatrelvir/ritonavir (PAXLOVID) 3 tablet (3 tablets Oral Not Given 12/06/22 1145)  folic acid (FOLVITE) tablet 1 mg (1 mg Oral Given 12/06/22 1037)  multivitamin with minerals tablet 1 tablet (1 tablet Oral Given 12/06/22 1037)  thiamine (VITAMIN B1) tablet 100 mg (100 mg Oral Given 12/06/22 1037)  warfarin (COUMADIN) tablet 5 mg (has no administration in time range)  cefTRIAXone (ROCEPHIN) 2 g in sodium chloride 0.9 % 100 mL IVPB (0 g Intravenous Stopped 12/06/22 0009)  warfarin (COUMADIN) tablet 5 mg (5 mg Oral Given 12/06/22 0006)    Mobility non-ambulatory-generalized weakness     Focused Assessments Pulmonary Assessment Handoff:  Lung sounds:   O2 Device: Room Air      R Recommendations: See Admitting Provider Note  Report given to:    Additional Notes: Baseline can walk with assistance. PT/OT worked with him and he can barely hold himself up in a sitting position. Daughter states 2 days ago he walked into the voting area.

## 2022-12-06 NOTE — ED Notes (Signed)
Pt complains of severe throat soreness with accompanying headache and nonproductive cough. Provider aware and orders placed.

## 2022-12-06 NOTE — Evaluation (Signed)
Physical Therapy Evaluation Patient Details Name: Joseph Malone MRN: [id number] DOB: Sep 13, 1930 Today's Date: 12/06/2022  History of Present Illness  Pt is a 89 y/o male presenting 10/25 with weakness and fall from bed. Admitted for UTI, acute cystitis and found to be COVID+. PMH: CAD s/p CABG x 1, ICM, CHF, ischemic cardiomyopathy, ICD, LV thrombus, HTN, HLD, DM, CKD, BPH and pulmonary nodules.  Clinical Impression  Pt admitted with above diagnosis. Lives alone, reports a couple of falls at home since he as been sick. Able to rise to EOB with mod assist this afternoon. Min assist to transfer from high surface of stretcher, and ambulate with min assist using RW. SpO2 mid to upper 90s during session on RA. HR 90s at rest, up to 120s with mobility. Coughing frequently. Would benefit from post acute rehab due to weakness and limited assistance at home. Will update recs as pt progresses. Pt currently with functional limitations due to the deficits listed below (see PT Problem List). Pt will benefit from acute skilled PT to increase their independence and safety with mobility to allow discharge.           If plan is discharge home, recommend the following: A little help with walking and/or transfers;A lot of help with bathing/dressing/bathroom;Assistance with cooking/housework;Direct supervision/assist for medications management;Direct supervision/assist for financial management;Assist for transportation;Help with stairs or ramp for entrance;Supervision due to cognitive status   Can travel by private vehicle   No (likely soon)    Equipment Recommendations None recommended by PT  Recommendations for Other Services       Functional Status Assessment Patient has had a recent decline in their functional status and demonstrates the ability to make significant improvements in function in a reasonable and predictable amount of time.     Precautions / Restrictions Precautions Precautions:  Fall Restrictions Weight Bearing Restrictions: No      Mobility  Bed Mobility Overal bed mobility: Needs Assistance Bed Mobility: Supine to Sit, Sit to Supine     Supine to sit: HOB elevated, Mod assist Sit to supine: Contact guard assist   General bed mobility comments: Mod assist to rise to EOB, leaning posteriorly. Requires cues for technique, pulls through therapist's hand with with LUE. Leaning posteriorly until assisted to scoot to EOB with feet flat on floor. CGA for pt to return to supine, slowly lifts LEs back but no physical assist this afternoon.    Transfers Overall transfer level: Needs assistance Equipment used: Rolling walker (2 wheels) Transfers: Sit to/from Stand Sit to Stand: Min assist, From elevated surface           General transfer comment: Min assist for boost to stand from stretcher (high surface.) VC for technique. HR increases to 120s. Minor instability but stabilizes with RW for support. posterior lean.    Ambulation/Gait Ambulation/Gait assistance: Min assist Gait Distance (Feet): 22 Feet Assistive device: Rolling walker (2 wheels) Gait Pattern/deviations: Step-through pattern, Decreased stride length, Knee flexed in stance - right, Knee flexed in stance - left, Drifts right/left, Leaning posteriorly Gait velocity: dec Gait velocity interpretation: <1.31 ft/sec, indicative of household ambulator   General Gait Details: Educated on safe AD use and proximity to RW for support. Minor posterior instability, min assist initially for balance, and RW control while mobilizing in room. SpO2 upper 90s on RA. Fatigues easily.  Stairs            Wheelchair Mobility     Tilt Bed    Modified Rankin (Stroke  Patients Only)       Balance Overall balance assessment: Needs assistance Sitting-balance support: Feet supported, Single extremity supported Sitting balance-Leahy Scale: Poor Sitting balance - Comments: CGA EOB if feet flat on  floor. Postural control: Posterior lean Standing balance support: Bilateral upper extremity supported, Reliant on assistive device for balance Standing balance-Leahy Scale: Poor                               Pertinent Vitals/Pain Pain Assessment Pain Assessment: No/denies pain    Home Living Family/patient expects to be discharged to:: Private residence Living Arrangements: Alone Available Help at Discharge: Family;Available PRN/intermittently Type of Home: Apartment Home Access: Level entry       Home Layout: One level Home Equipment: Agricultural consultant (2 wheels);Cane - single point      Prior Function Prior Level of Function : Independent/Modified Independent;Patient poor historian/Family not available             Mobility Comments: cane vs RW use but prefers cane ADLs Comments: reports able to dress self "most of the time" but daughter will assist, reports standing for showers. daughter will assist with meals, grocery shopping as needed. pt reports managing own meds with pill box     Extremity/Trunk Assessment   Upper Extremity Assessment Upper Extremity Assessment: Defer to OT evaluation;Right hand dominant    Lower Extremity Assessment Lower Extremity Assessment: Generalized weakness;Difficult to assess due to impaired cognition    Cervical / Trunk Assessment Cervical / Trunk Assessment: Kyphotic  Communication   Communication Communication: No apparent difficulties  Cognition Arousal: Alert Behavior During Therapy: WFL for tasks assessed/performed Overall Cognitive Status: Impaired/Different from baseline Area of Impairment: Safety/judgement, Awareness, Problem solving                         Safety/Judgement: Decreased awareness of safety, Decreased awareness of deficits Awareness: Emergent, Intellectual Problem Solving: Decreased initiation, Difficulty sequencing, Requires verbal cues          General Comments General comments  (skin integrity, edema, etc.): HR 90s at rest, up to 120s with mobility, SpO2 mid to high 90s throughout session on RA.    Exercises     Assessment/Plan    PT Assessment Patient needs continued PT services  PT Problem List Decreased strength;Decreased activity tolerance;Decreased balance;Decreased mobility;Decreased coordination;Decreased cognition;Decreased knowledge of use of DME;Decreased safety awareness;Decreased knowledge of precautions;Cardiopulmonary status limiting activity;Obesity       PT Treatment Interventions DME instruction;Gait training;Functional mobility training;Therapeutic activities;Therapeutic exercise;Balance training;Neuromuscular re-education;Cognitive remediation;Patient/family education    PT Goals (Current goals can be found in the Care Plan section)  Acute Rehab PT Goals Patient Stated Goal: get well PT Goal Formulation: With patient Time For Goal Achievement: 12/20/22 Potential to Achieve Goals: Good    Frequency Min 1X/week     Co-evaluation               AM-PAC PT "6 Clicks" Mobility  Outcome Measure Help needed turning from your back to your side while in a flat bed without using bedrails?: A Little Help needed moving from lying on your back to sitting on the side of a flat bed without using bedrails?: A Lot Help needed moving to and from a bed to a chair (including a wheelchair)?: A Little Help needed standing up from a chair using your arms (e.g., wheelchair or bedside chair)?: A Little Help needed to walk in hospital  room?: A Little Help needed climbing 3-5 steps with a railing? : A Lot 6 Click Score: 16    End of Session Equipment Utilized During Treatment: Gait belt Activity Tolerance: Patient tolerated treatment well Patient left: in bed;with call bell/phone within reach;Other (comment) (eating lunch) Nurse Communication: Mobility status PT Visit Diagnosis: Unsteadiness on feet (R26.81);Other abnormalities of gait and mobility  (R26.89);Repeated falls (R29.6);History of falling (Z91.81);Muscle weakness (generalized) (M62.81);Difficulty in walking, not elsewhere classified (R26.2)    Time: 2952-8413 PT Time Calculation (min) (ACUTE ONLY): 18 min   Charges:   PT Evaluation $PT Eval Low Complexity: 1 Low   PT General Charges $$ ACUTE PT VISIT: 1 Visit         Kathlyn Sacramento, PT, DPT [facility]  Rehabilitation Services Physical Therapist Office: [phone] Website: [email or web address]   Berton Mount 12/06/2022, 1:30 PM

## 2022-12-06 NOTE — ED Notes (Signed)
Report received from Tiburcio Pea. RN. Assumed care of pt at this time.

## 2022-12-06 NOTE — Plan of Care (Signed)
  Problem: Fluid Volume: Goal: Ability to maintain a balanced intake and output will improve Outcome: Progressing   Problem: Skin Integrity: Goal: Risk for impaired skin integrity will decrease Outcome: Progressing   

## 2022-12-06 NOTE — Progress Notes (Addendum)
ANTICOAGULATION CONSULT NOTE - Initial Consult  Pharmacy Consult for Warfarin Indication:  LV Thrombus  Allergies  Allergen Reactions   Penicillins Rash and Other (See Comments)    Because of a history of documented adverse serious drug reaction;Medi Alert bracelet  is recommended Has patient had a PCN reaction causing immediate rash, facial/tongue/throat swelling, SOB or lightheadedness with hypotension: No Has patient had a PCN reaction causing severe rash involving mucus membranes or skin necrosis: No Has patient had a PCN reaction that required hospitalization: No Has patient had a PCN reaction occurring within the last 10 years: No If all of the above answers are "NO", t    Patient Measurements: Height: 5\' 11"  (180.3 cm) Weight: 95.7 kg (211 lb) IBW/kg (Calculated) : 75.3  Vital Signs: Temp: 99.6 F (37.6 C) (10/26 0506) Temp Source: Oral (10/26 0506) BP: 163/106 (10/26 0600) Pulse Rate: 100 (10/26 0600)  Labs: Recent Labs    12/05/22 1417 12/05/22 1555 12/05/22 2220 12/06/22 0243  HGB 15.6  --   --  14.3  HCT 46.1  --   --  42.1  PLT 199  --   --  149*  LABPROT  --   --  22.6* 22.3*  INR  --   --  2.0* 1.9*  CREATININE  --  1.45*  --  1.20  TROPONINIHS 15 19*  --   --     Estimated Creatinine Clearance: 48.3 mL/min (by C-G formula based on SCr of 1.2 mg/dL).   Medical History: Past Medical History:  Diagnosis Date   Anxiety    BPH (benign prostatic hypertrophy)    CAD (coronary artery disease)    s/p CABG   CHF (congestive heart failure) (HCC)    Diabetes 1.5, managed as type 2 (HCC)    Diverticulosis    Diverticulosis of colon without hemorrhage 10/22/2007   Qualifier: Diagnosis of  By: Alwyn Ren MD, Chrissie Noa     DOE (dyspnea on exertion)    Excess weight    Fasting hyperglycemia    Generalized weakness    GERD (gastroesophageal reflux disease)    Heart attack (HCC) 2001   Heart failure    CHF due to ischemic CM   History of kidney stones     Hyperlipidemia    Hypertension    Ischemic cardiomyopathy    Microscopic hematuria    [facility]   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    Medications:  (Not in a hospital admission)  Scheduled:   aspirin  81 mg Oral QPM   insulin aspart  0-5 Units Subcutaneous QHS   insulin aspart  0-6 Units Subcutaneous TID WC   simvastatin  20 mg Oral QHS   sodium chloride flush  3 mL Intravenous Q12H   spironolactone  12.5 mg Oral Daily   tamsulosin  0.4 mg Oral QHS   Warfarin - Pharmacist Dosing Inpatient   Does not apply q1600   Infusions:   sodium chloride     cefTRIAXone (ROCEPHIN)  IV     PRN: sodium chloride, acetaminophen **OR** acetaminophen, guaiFENesin, melatonin, menthol-cetylpyridinium, nitroGLYCERIN, phenol, senna-docusate, sodium chloride flush  Assessment: 89yo M with a history of CAD s/p CABG, ICM, HF, ICD, LV thrombus on warfarin, HTN, HLD, T2DM, CKD, BPH. Patient is presenting with weakness and c/f UTI. Warfarin per pharmacy consult placed for  LV Thrombus .  Patient taking warfarin prior to arrival. Home dose is 2.5 mg (5 mg x 0.5) every Mon; 5 mg (5 mg  x 1) all other days.Patient is unable to clarify last dose prior to admission.  INR, 1.9, is subtherapeutic Hgb 14.3, Plt 149 - stable  Patient tested positive for COVID on 10/26 and Paxlovid to start x5 days (10/26-10/30)  Goal of Therapy:  INR Goal 2-3 Monitor platelets by anticoagulation protocol: Yes   Plan:  Give 5mg  warfarin tonight Monitor for s/s of bleeding, daily INR, CBC Assess for drug interactions with initiation of Paxlovid. Paxlovid may result in either elevations or reductions in INR due to ritonavir being a CYP3A4 inhibitor, and CYP1A2 and CYP2C9 inducer, which are associated with warfarin metabolism. Monitor INR each day and adjust during and post-therapy.    Wilburn Cornelia, PharmD, BCPS Clinical Pharmacist 12/06/2022 7:19 AM   Please refer to [facility] for pharmacy phone number

## 2022-12-06 NOTE — ED Notes (Signed)
Pt gave this RN permission to speak with his daughter about his hospital stay.  After the conversation, used the room phone and allowed the pt to talk to his daughter himself.

## 2022-12-07 ENCOUNTER — Observation Stay (HOSPITAL_COMMUNITY): Payer: Medicare Other

## 2022-12-07 DIAGNOSIS — I252 Old myocardial infarction: Secondary | ICD-10-CM | POA: Diagnosis not present

## 2022-12-07 DIAGNOSIS — Z7722 Contact with and (suspected) exposure to environmental tobacco smoke (acute) (chronic): Secondary | ICD-10-CM | POA: Diagnosis present

## 2022-12-07 DIAGNOSIS — Y92003 Bedroom of unspecified non-institutional (private) residence as the place of occurrence of the external cause: Secondary | ICD-10-CM | POA: Diagnosis not present

## 2022-12-07 DIAGNOSIS — R918 Other nonspecific abnormal finding of lung field: Secondary | ICD-10-CM | POA: Diagnosis not present

## 2022-12-07 DIAGNOSIS — I255 Ischemic cardiomyopathy: Secondary | ICD-10-CM | POA: Diagnosis present

## 2022-12-07 DIAGNOSIS — N400 Enlarged prostate without lower urinary tract symptoms: Secondary | ICD-10-CM | POA: Diagnosis present

## 2022-12-07 DIAGNOSIS — I447 Left bundle-branch block, unspecified: Secondary | ICD-10-CM | POA: Diagnosis present

## 2022-12-07 DIAGNOSIS — I5042 Chronic combined systolic (congestive) and diastolic (congestive) heart failure: Secondary | ICD-10-CM | POA: Diagnosis present

## 2022-12-07 DIAGNOSIS — Z95810 Presence of automatic (implantable) cardiac defibrillator: Secondary | ICD-10-CM | POA: Diagnosis not present

## 2022-12-07 DIAGNOSIS — Z961 Presence of intraocular lens: Secondary | ICD-10-CM | POA: Diagnosis present

## 2022-12-07 DIAGNOSIS — B9620 Unspecified Escherichia coli [E. coli] as the cause of diseases classified elsewhere: Secondary | ICD-10-CM | POA: Diagnosis present

## 2022-12-07 DIAGNOSIS — I2510 Atherosclerotic heart disease of native coronary artery without angina pectoris: Secondary | ICD-10-CM | POA: Diagnosis present

## 2022-12-07 DIAGNOSIS — W06XXXA Fall from bed, initial encounter: Secondary | ICD-10-CM | POA: Diagnosis present

## 2022-12-07 DIAGNOSIS — Z8249 Family history of ischemic heart disease and other diseases of the circulatory system: Secondary | ICD-10-CM | POA: Diagnosis not present

## 2022-12-07 DIAGNOSIS — E785 Hyperlipidemia, unspecified: Secondary | ICD-10-CM | POA: Diagnosis present

## 2022-12-07 DIAGNOSIS — E1322 Other specified diabetes mellitus with diabetic chronic kidney disease: Secondary | ICD-10-CM | POA: Diagnosis present

## 2022-12-07 DIAGNOSIS — I130 Hypertensive heart and chronic kidney disease with heart failure and stage 1 through stage 4 chronic kidney disease, or unspecified chronic kidney disease: Secondary | ICD-10-CM | POA: Diagnosis present

## 2022-12-07 DIAGNOSIS — D696 Thrombocytopenia, unspecified: Secondary | ICD-10-CM | POA: Diagnosis present

## 2022-12-07 DIAGNOSIS — Z7984 Long term (current) use of oral hypoglycemic drugs: Secondary | ICD-10-CM | POA: Diagnosis not present

## 2022-12-07 DIAGNOSIS — Z951 Presence of aortocoronary bypass graft: Secondary | ICD-10-CM | POA: Diagnosis not present

## 2022-12-07 DIAGNOSIS — N1832 Chronic kidney disease, stage 3b: Secondary | ICD-10-CM | POA: Diagnosis present

## 2022-12-07 DIAGNOSIS — U071 COVID-19: Secondary | ICD-10-CM | POA: Diagnosis present

## 2022-12-07 DIAGNOSIS — I513 Intracardiac thrombosis, not elsewhere classified: Secondary | ICD-10-CM | POA: Diagnosis present

## 2022-12-07 DIAGNOSIS — N3000 Acute cystitis without hematuria: Secondary | ICD-10-CM | POA: Diagnosis present

## 2022-12-07 DIAGNOSIS — Z8616 Personal history of COVID-19: Secondary | ICD-10-CM | POA: Diagnosis not present

## 2022-12-07 DIAGNOSIS — R296 Repeated falls: Secondary | ICD-10-CM | POA: Diagnosis present

## 2022-12-07 DIAGNOSIS — J1282 Pneumonia due to coronavirus disease 2019: Secondary | ICD-10-CM | POA: Diagnosis present

## 2022-12-07 LAB — CBC WITH DIFFERENTIAL/PLATELET
Abs Immature Granulocytes: 0 10*3/uL (ref 0.00–0.07)
Basophils Absolute: 0 10*3/uL (ref 0.0–0.1)
Basophils Relative: 0 %
Eosinophils Absolute: 0 10*3/uL (ref 0.0–0.5)
Eosinophils Relative: 0 %
HCT: 42.3 % (ref 39.0–52.0)
Hemoglobin: 14.6 g/dL (ref 13.0–17.0)
Lymphocytes Relative: 10 %
Lymphs Abs: 1.1 10*3/uL (ref 0.7–4.0)
MCH: 31.1 pg (ref 26.0–34.0)
MCHC: 34.5 g/dL (ref 30.0–36.0)
MCV: 90 fL (ref 80.0–100.0)
Monocytes Absolute: 0.7 10*3/uL (ref 0.1–1.0)
Monocytes Relative: 7 %
Neutro Abs: 8.9 10*3/uL — ABNORMAL HIGH (ref 1.7–7.7)
Neutrophils Relative %: 83 %
Platelets: 145 10*3/uL — ABNORMAL LOW (ref 150–400)
RBC: 4.7 MIL/uL (ref 4.22–5.81)
RDW: 12.3 % (ref 11.5–15.5)
WBC: 10.7 10*3/uL — ABNORMAL HIGH (ref 4.0–10.5)
nRBC: 0 % (ref 0.0–0.2)
nRBC: 0 /100{WBCs}

## 2022-12-07 LAB — COMPREHENSIVE METABOLIC PANEL
ALT: 24 U/L (ref 0–44)
AST: 35 U/L (ref 15–41)
Albumin: 3 g/dL — ABNORMAL LOW (ref 3.5–5.0)
Alkaline Phosphatase: 44 U/L (ref 38–126)
Anion gap: 11 (ref 5–15)
BUN: 25 mg/dL — ABNORMAL HIGH (ref 8–23)
CO2: 22 mmol/L (ref 22–32)
Calcium: 8.5 mg/dL — ABNORMAL LOW (ref 8.9–10.3)
Chloride: 98 mmol/L (ref 98–111)
Creatinine, Ser: 1.39 mg/dL — ABNORMAL HIGH (ref 0.61–1.24)
GFR, Estimated: 48 mL/min — ABNORMAL LOW (ref >60)
Glucose, Bld: 131 mg/dL — ABNORMAL HIGH (ref 70–99)
Potassium: 3.9 mmol/L (ref 3.5–5.1)
Sodium: 131 mmol/L — ABNORMAL LOW (ref 135–145)
Total Bilirubin: 0.5 mg/dL (ref 0.3–1.2)
Total Protein: 6.5 g/dL (ref 6.5–8.1)

## 2022-12-07 LAB — PROTIME-INR
INR: 2.4 — ABNORMAL HIGH (ref 0.8–1.2)
Prothrombin Time: 26.7 s — ABNORMAL HIGH (ref 11.4–15.2)

## 2022-12-07 LAB — GLUCOSE, CAPILLARY
Glucose-Capillary: 139 mg/dL — ABNORMAL HIGH (ref 70–99)
Glucose-Capillary: 125 mg/dL — ABNORMAL HIGH (ref 70–99)
Glucose-Capillary: 202 mg/dL — ABNORMAL HIGH (ref 70–99)
Glucose-Capillary: 243 mg/dL — ABNORMAL HIGH (ref 70–99)

## 2022-12-07 LAB — FERRITIN: Ferritin: 264 ng/mL (ref 24–336)

## 2022-12-07 LAB — D-DIMER, QUANTITATIVE: D-Dimer, Quant: 0.72 ug{FEU}/mL — ABNORMAL HIGH (ref 0.00–0.50)

## 2022-12-07 LAB — LACTIC ACID, PLASMA: Lactic Acid, Venous: 1.1 mmol/L (ref 0.5–1.9)

## 2022-12-07 LAB — C-REACTIVE PROTEIN: CRP: 18.4 mg/dL — ABNORMAL HIGH (ref <1.0)

## 2022-12-07 LAB — LACTATE DEHYDROGENASE: LDH: 228 U/L — ABNORMAL HIGH (ref 98–192)

## 2022-12-07 MED ORDER — DEXAMETHASONE 6 MG PO TABS
6.0000 mg | ORAL_TABLET | ORAL | Status: DC
  Administered 2022-12-07 – 2022-12-10 (×4): 6 mg via ORAL
  Filled 2022-12-07 (×4): qty 1

## 2022-12-07 MED ORDER — WARFARIN SODIUM 2 MG PO TABS
2.0000 mg | ORAL_TABLET | Freq: Once | ORAL | Status: AC
  Administered 2022-12-07: 2 mg via ORAL
  Filled 2022-12-07: qty 1

## 2022-12-07 MED ORDER — ORAL CARE MOUTH RINSE: 15.0000 mL | OROMUCOSAL | Status: DC | PRN

## 2022-12-07 NOTE — Assessment & Plan Note (Signed)
Continue simvastatin has been held.

## 2022-12-07 NOTE — Assessment & Plan Note (Signed)
Noted. The patient will be monitored on telemetry.

## 2022-12-07 NOTE — Progress Notes (Signed)
ANTICOAGULATION CONSULT NOTE - Initial Consult  Pharmacy Consult for Warfarin Indication:  LV Thrombus  Allergies  Allergen Reactions   Penicillins Rash and Other (See Comments)    Because of a history of documented adverse serious drug reaction;Medi Alert bracelet  is recommended Has patient had a PCN reaction causing immediate rash, facial/tongue/throat swelling, SOB or lightheadedness with hypotension: No Has patient had a PCN reaction causing severe rash involving mucus membranes or skin necrosis: No Has patient had a PCN reaction that required hospitalization: No Has patient had a PCN reaction occurring within the last 10 years: No If all of the above answers are "NO", t    Patient Measurements: Height: 5\' 11"  (180.3 cm) Weight: 99.1 kg (218 lb 7.6 oz) IBW/kg (Calculated) : 75.3  Vital Signs: Temp: 97.9 F (36.6 C) (10/27 0735) Temp Source: Oral (10/27 0735) BP: 110/63 (10/27 0735) Pulse Rate: 74 (10/27 0735)  Labs: Recent Labs    12/05/22 1417 12/05/22 1417 12/05/22 1555 12/05/22 2220 12/06/22 0243 12/06/22 1430 12/07/22 0414  HGB 15.6  --   --   --  14.3 15.2 14.6  HCT 46.1  --   --   --  42.1 44.9 42.3  PLT 199  --   --   --  149* 155 145*  LABPROT  --   --   --  22.6* 22.3*  --  26.7*  INR  --   --   --  2.0* 1.9*  --  2.4*  CREATININE  --    < > 1.45*  --  1.20 1.47* 1.39*  TROPONINIHS 15  --  19*  --   --   --   --    < > = values in this interval not displayed.    Estimated Creatinine Clearance: 42.4 mL/min (A) (by C-G formula based on SCr of 1.39 mg/dL (H)).   Medical History: Past Medical History:  Diagnosis Date   Anxiety    BPH (benign prostatic hypertrophy)    CAD (coronary artery disease)    s/p CABG   CHF (congestive heart failure) (HCC)    Diabetes 1.5, managed as type 2 (HCC)    Diverticulosis    Diverticulosis of colon without hemorrhage 10/22/2007   Qualifier: Diagnosis of  By: Alwyn Ren MD, Chrissie Noa     DOE (dyspnea on exertion)     Excess weight    Fasting hyperglycemia    Generalized weakness    GERD (gastroesophageal reflux disease)    Heart attack (HCC) 2001   Heart failure    CHF due to ischemic CM   History of kidney stones    Hyperlipidemia    Hypertension    Ischemic cardiomyopathy    Microscopic hematuria    [facility]   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      Cholecalciferol (VITAMIN D3) 2000 UNITS TABS Take 2,000 Units by mouth daily.      folic acid (FOLVITE) 400 MCG tablet Take 400 mcg by mouth daily.      Glucosamine HCl 1500 MG TABS Take 1,500 mg by mouth every evening.    12/04/2022 at pm   Melatonin 5 MG CHEW Chew 1 tablet by mouth daily as needed.   12/04/2022   metFORMIN (GLUCOPHAGE-XR) 500 MG 24 hr tablet TAKE 1 TABLET(500  MG) BY MOUTH DAILY WITH BREAKFAST 90 tablet 3 12/05/2022   Multiple Vitamin (MULTIVITAMIN WITH MINERALS) TABS Take 1 tablet by mouth daily.    12/04/2022 at pm   nitroGLYCERIN (NITROSTAT) 0.4 MG SL tablet Place 1 tablet (0.4 mg total) under the tongue every 5 (five) minutes as needed for chest pain. 1 tablet every 5 minutes for 3 dose only 90 tablet 3 on hand   simvastatin (ZOCOR) 40 MG tablet Take 0.5 tablets (20 mg total) by mouth at bedtime. 45 tablet 3 12/04/2022 at pm   spironolactone (ALDACTONE) 25 MG tablet Take 0.5 tablets (12.5 mg total) by mouth daily. 90 tablet 3 12/04/2022 at pm   tamsulosin (FLOMAX) 0.4 MG CAPS capsule Take 0.4 mg by mouth at bedtime.   12/04/2022   vitamin B-12 (CYANOCOBALAMIN) 100 MCG tablet Take 100 mcg by mouth daily.   12/04/2022   warfarin (COUMADIN) 5 MG tablet TAKE 1 TABLET BY MOUTH ON MONDAY, WEDNESDAY, AND FRIDAY, AND TAKE ONE-HALF TABLET ALL OTHER DAYS OR AS DIRECTED (Patient taking differently: Take 2.5-5 mg by mouth See admin instructions. Take 1/2 tablet  by mouth on Monday and 1 tablet all other days or as directed by [facility]) 100 tablet 1 12/04/2022   Scheduled:   aspirin  81 mg Oral QPM   folic acid  1 mg Oral Daily   insulin aspart  0-5 Units Subcutaneous QHS   insulin aspart  0-6 Units Subcutaneous TID WC   multivitamin with minerals  1 tablet Oral Daily   nirmatrelvir/ritonavir (renal dosing)  2 tablet Oral BID   simvastatin  20 mg Oral QHS   sodium chloride flush  3 mL Intravenous Q12H   spironolactone  12.5 mg Oral Daily   tamsulosin  0.4 mg Oral QHS   thiamine  100 mg Oral Daily   Warfarin - Pharmacist Dosing Inpatient   Does not apply q1600   Infusions:   cefTRIAXone (ROCEPHIN)  IV 1 g (12/06/22 2158)   PRN: acetaminophen **OR** acetaminophen, guaiFENesin, melatonin, menthol-cetylpyridinium, nitroGLYCERIN, mouth rinse, phenol, senna-docusate, sodium chloride flush  Assessment: 89yo M with a history of CAD s/p CABG, ICM, HF, ICD, LV thrombus on warfarin, HTN, HLD, T2DM, CKD, BPH. Patient is presenting with weakness and c/f UTI. Warfarin per pharmacy consult placed for  LV Thrombus .  Patient taking warfarin prior to arrival. Home dose is 2.5 mg (5 mg x 0.5) every Mon; 5 mg (5 mg x 1) all other days.Patient is unable to clarify last dose prior to admission.  INR is 2.4 and is therapeutic. CBC stable. No signs of bleeding noted  Patient tested positive for COVID on 10/26 and Paxlovid to start x5 days (10/26-10/30)  Goal of Therapy:  INR Goal 2-3 Monitor platelets by anticoagulation protocol: Yes   Plan:  Give 2 mg warfarin tonight Monitor for s/s of bleeding, daily INR, CBC Assess for drug interactions with initiation of Paxlovid. Paxlovid may result in either elevations or reductions in INR due to ritonavir being a CYP3A4 inhibitor, and CYP1A2 and CYP2C9 inducer, which are associated with warfarin metabolism. Monitor INR each day and adjust during and post-therapy.    Thank you for involving pharmacy in the  patient's care.   Theotis Burrow, PharmD PGY1 Acute Care Pharmacy Resident  12/07/2022 8:06 AM

## 2022-12-07 NOTE — Assessment & Plan Note (Signed)
Continue ASA, NTG, Symvastatin, ASA 81 mg as at home. The patient will be monitored on telemetry.

## 2022-12-07 NOTE — Assessment & Plan Note (Signed)
Noted. The patient's renal status appears to be at baseline with a creatinine of 1.20 on admission. Monitor and avoid nephrotoxic agents.

## 2022-12-07 NOTE — Assessment & Plan Note (Signed)
INR 1.9 on admission. Will continue coumadin and monitor INR.

## 2022-12-07 NOTE — Assessment & Plan Note (Signed)
Although on 12/06/2022 the patient has no evidence of respiratory compromise, he is displaying symptoms of COVID infection with fevers, myalgias, sore throat, and fever. This is also likely behind the patient's complaints of weakness and frequent falls. He will be treated with paxlovid. He will be closely monitored for respiratory issues. He is at high risk of decompensation due to his age and multiple cardiac comorbidities and DM II.

## 2022-12-07 NOTE — Plan of Care (Signed)
  Problem: Nutritional: Goal: Maintenance of adequate nutrition will improve Outcome: Progressing Goal: Progress toward achieving an optimal weight will improve Outcome: Progressing   Problem: Respiratory: Goal: Will maintain a patent airway Outcome: Progressing Goal: Complications related to the disease process, condition or treatment will be avoided or minimized Outcome: Progressing   Problem: Health Behavior/Discharge Planning: Goal: Ability to manage health-related needs will improve Outcome: Progressing   Problem: Clinical Measurements: Goal: Respiratory complications will improve Outcome: Progressing Goal: Cardiovascular complication will be avoided Outcome: Progressing   Problem: Nutrition: Goal: Adequate nutrition will be maintained Outcome: Progressing   Problem: Safety: Goal: Ability to remain free from injury will improve Outcome: Progressing

## 2022-12-07 NOTE — Assessment & Plan Note (Signed)
Stable. The patient will be continued on his NTG SL 0.4 mg prn chest pain, Zocor, ASA 81 mg. He will be monitored on telemetry.

## 2022-12-07 NOTE — Care Management Obs Status (Signed)
MEDICARE OBSERVATION STATUS NOTIFICATION   Patient Details  Name: Joseph Malone MRN: [id number] Date of Birth: 1931/02/02   Medicare Observation Status Notification Given:  Yes    Ronny Bacon, RN 12/07/2022, 9:37 AM

## 2022-12-07 NOTE — Plan of Care (Signed)
  Problem: Education: Goal: Ability to describe self-care measures that may prevent or decrease complications (Diabetes Survival Skills Education) will improve Outcome: Progressing Goal: Individualized Educational Video(s) Outcome: Progressing   Problem: Coping: Goal: Ability to adjust to condition or change in health will improve Outcome: Progressing   Problem: Fluid Volume: Goal: Ability to maintain a balanced intake and output will improve Outcome: Progressing   Problem: Health Behavior/Discharge Planning: Goal: Ability to identify and utilize available resources and services will improve Outcome: Progressing Goal: Ability to manage health-related needs will improve Outcome: Progressing   Problem: Metabolic: Goal: Ability to maintain appropriate glucose levels will improve Outcome: Progressing   Problem: Nutritional: Goal: Maintenance of adequate nutrition will improve Outcome: Progressing Goal: Progress toward achieving an optimal weight will improve Outcome: Progressing   Problem: Skin Integrity: Goal: Risk for impaired skin integrity will decrease Outcome: Progressing   Problem: Tissue Perfusion: Goal: Adequacy of tissue perfusion will improve Outcome: Progressing   Problem: Education: Goal: Knowledge of risk factors and measures for prevention of condition will improve Outcome: Progressing   Problem: Coping: Goal: Psychosocial and spiritual needs will be supported Outcome: Progressing   Problem: Respiratory: Goal: Will maintain a patent airway Outcome: Progressing Goal: Complications related to the disease process, condition or treatment will be avoided or minimized Outcome: Progressing   Problem: Education: Goal: Knowledge of General Education information will improve Description: Including pain rating scale, medication(s)/side effects and non-pharmacologic comfort measures Outcome: Progressing   Problem: Health Behavior/Discharge Planning: Goal:  Ability to manage health-related needs will improve Outcome: Progressing   Problem: Clinical Measurements: Goal: Ability to maintain clinical measurements within normal limits will improve Outcome: Progressing Goal: Will remain free from infection Outcome: Progressing Goal: Diagnostic test results will improve Outcome: Progressing Goal: Respiratory complications will improve Outcome: Progressing Goal: Cardiovascular complication will be avoided Outcome: Progressing   Problem: Activity: Goal: Risk for activity intolerance will decrease Outcome: Progressing   Problem: Nutrition: Goal: Adequate nutrition will be maintained Outcome: Progressing   Problem: Coping: Goal: Level of anxiety will decrease Outcome: Progressing   Problem: Elimination: Goal: Will not experience complications related to bowel motility Outcome: Progressing Goal: Will not experience complications related to urinary retention Outcome: Progressing   Problem: Pain Management: Goal: General experience of comfort will improve Outcome: Progressing   Problem: Safety: Goal: Ability to remain free from injury will improve Outcome: Progressing   Problem: Skin Integrity: Goal: Risk for impaired skin integrity will decrease Outcome: Progressing

## 2022-12-07 NOTE — Assessment & Plan Note (Signed)
The patient is currently normotensive. Will hold antihypertensives at this time.

## 2022-12-07 NOTE — Care Management Obs Status (Signed)
MEDICARE OBSERVATION STATUS NOTIFICATION   Patient Details  Name: Joseph Malone MRN: [id number] Date of Birth: 12-28-1930   Medicare Observation Status Notification Given:  Yes    Ronny Bacon, RN 12/07/2022, 7:58 AM

## 2022-12-07 NOTE — Assessment & Plan Note (Signed)
Continue tamsulosin as prior to admission.

## 2022-12-07 NOTE — Assessment & Plan Note (Signed)
Noted. The patient's most recent echocardiogram was in 07/2022. It demonstrated an EF of 30-35% with moderately decreased function and regional wall motion abnormalities. At this echo diastolic parameters were indeterminate. His right ventricular systolic function was mildly reduced. Will monitor the patient's volume status carefully.

## 2022-12-08 DIAGNOSIS — U071 COVID-19: Secondary | ICD-10-CM | POA: Diagnosis not present

## 2022-12-08 LAB — GLUCOSE, CAPILLARY
Glucose-Capillary: 200 mg/dL — ABNORMAL HIGH (ref 70–99)
Glucose-Capillary: 221 mg/dL — ABNORMAL HIGH (ref 70–99)
Glucose-Capillary: 214 mg/dL — ABNORMAL HIGH (ref 70–99)
Glucose-Capillary: 221 mg/dL — ABNORMAL HIGH (ref 70–99)

## 2022-12-08 LAB — PROTIME-INR
INR: 3.2 — ABNORMAL HIGH (ref 0.8–1.2)
Prothrombin Time: 32.8 s — ABNORMAL HIGH (ref 11.4–15.2)

## 2022-12-08 LAB — URINE CULTURE: Culture: 70000 — AB

## 2022-12-08 MED ORDER — CEFADROXIL 500 MG PO CAPS
1000.0000 mg | ORAL_CAPSULE | Freq: Two times a day (BID) | ORAL | Status: DC
  Administered 2022-12-08 – 2022-12-10 (×4): 1000 mg via ORAL
  Filled 2022-12-08 (×5): qty 2

## 2022-12-08 MED ORDER — WARFARIN 0.5 MG HALF TABLET
0.5000 mg | ORAL_TABLET | Freq: Once | ORAL | Status: AC
  Administered 2022-12-08: 0.5 mg via ORAL
  Filled 2022-12-08: qty 1

## 2022-12-08 MED FILL — Nirmatrelvir Tab 10 x 150 MG & Ritonavir Tab 10 x 100 MG Pak: ORAL | Qty: 20 | Status: AC

## 2022-12-08 NOTE — Assessment & Plan Note (Signed)
-   Continue IV antibiotics

## 2022-12-08 NOTE — Progress Notes (Signed)
ANTICOAGULATION CONSULT NOTE  Pharmacy Consult for Warfarin Indication:  LV Thrombus  Allergies  Allergen Reactions   Penicillins Rash and Other (See Comments)    Because of a history of documented adverse serious drug reaction;Medi Alert bracelet  is recommended Has patient had a PCN reaction causing immediate rash, facial/tongue/throat swelling, SOB or lightheadedness with hypotension: No Has patient had a PCN reaction causing severe rash involving mucus membranes or skin necrosis: No Has patient had a PCN reaction that required hospitalization: No Has patient had a PCN reaction occurring within the last 10 years: No If all of the above answers are "NO", t    Patient Measurements: Height: 5\' 11"  (180.3 cm) Weight: 96.3 kg (212 lb 4.8 oz) IBW/kg (Calculated) : 75.3  Vital Signs: Temp: 97.9 F (36.6 C) (10/28 0415) Temp Source: Oral (10/28 0415) BP: 124/65 (10/28 0415)  Labs: Recent Labs     0000 12/05/22 1417 12/05/22 1555 12/05/22 2220 12/06/22 0243 12/06/22 1430 12/07/22 0414 12/08/22 0714  HGB  --  15.6  --   --  14.3 15.2 14.6  --   HCT  --  46.1  --   --  42.1 44.9 42.3  --   PLT  --  199  --   --  149* 155 145*  --   LABPROT  --   --   --    < > 22.3*  --  26.7* 32.8*  INR  --   --   --    < > 1.9*  --  2.4* 3.2*  CREATININE   < >  --  1.45*  --  1.20 1.47* 1.39*  --   TROPONINIHS  --  15 19*  --   --   --   --   --    < > = values in this interval not displayed.    Estimated Creatinine Clearance: 41.8 mL/min (A) (by C-G formula based on SCr of 1.39 mg/dL (H)).   Medical History: Past Medical History:  Diagnosis Date   Anxiety    BPH (benign prostatic hypertrophy)    CAD (coronary artery disease)    s/p CABG   CHF (congestive heart failure) (HCC)    Diabetes 1.5, managed as type 2 (HCC)    Diverticulosis    Diverticulosis of colon without hemorrhage 10/22/2007   Qualifier: Diagnosis of  By: Alwyn Ren MD, Chrissie Noa     DOE (dyspnea on exertion)     Excess weight    Fasting hyperglycemia    Generalized weakness    GERD (gastroesophageal reflux disease)    Heart attack (HCC) 2001   Heart failure    CHF due to ischemic CM   History of kidney stones    Hyperlipidemia    Hypertension    Ischemic cardiomyopathy    Microscopic hematuria    [facility]   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      Cholecalciferol (VITAMIN D3) 2000 UNITS TABS Take 2,000 Units by mouth daily.      folic acid (FOLVITE) 400 MCG tablet Take 400 mcg by mouth daily.      Glucosamine HCl 1500 MG TABS Take 1,500 mg by mouth every evening.    12/04/2022 at pm   Melatonin 5 MG CHEW Chew 1 tablet by mouth daily  as needed.   12/04/2022   metFORMIN (GLUCOPHAGE-XR) 500 MG 24 hr tablet TAKE 1 TABLET(500 MG) BY MOUTH DAILY WITH BREAKFAST 90 tablet 3 12/05/2022   Multiple Vitamin (MULTIVITAMIN WITH MINERALS) TABS Take 1 tablet by mouth daily.    12/04/2022 at pm   nitroGLYCERIN (NITROSTAT) 0.4 MG SL tablet Place 1 tablet (0.4 mg total) under the tongue every 5 (five) minutes as needed for chest pain. 1 tablet every 5 minutes for 3 dose only 90 tablet 3 on hand   simvastatin (ZOCOR) 40 MG tablet Take 0.5 tablets (20 mg total) by mouth at bedtime. 45 tablet 3 12/04/2022 at pm   spironolactone (ALDACTONE) 25 MG tablet Take 0.5 tablets (12.5 mg total) by mouth daily. 90 tablet 3 12/04/2022 at pm   tamsulosin (FLOMAX) 0.4 MG CAPS capsule Take 0.4 mg by mouth at bedtime.   12/04/2022   vitamin B-12 (CYANOCOBALAMIN) 100 MCG tablet Take 100 mcg by mouth daily.   12/04/2022   warfarin (COUMADIN) 5 MG tablet TAKE 1 TABLET BY MOUTH ON MONDAY, WEDNESDAY, AND FRIDAY, AND TAKE ONE-HALF TABLET ALL OTHER DAYS OR AS DIRECTED (Patient taking differently: Take 2.5-5 mg by mouth See admin instructions. Take 1/2 tablet  by mouth on Monday and 1 tablet all other days or as directed by [facility]) 100 tablet 1 12/04/2022   Scheduled:   aspirin  81 mg Oral QPM   dexamethasone  6 mg Oral Q24H   folic acid  1 mg Oral Daily   insulin aspart  0-5 Units Subcutaneous QHS   insulin aspart  0-6 Units Subcutaneous TID WC   multivitamin with minerals  1 tablet Oral Daily   nirmatrelvir/ritonavir (renal dosing)  2 tablet Oral BID   sodium chloride flush  3 mL Intravenous Q12H   spironolactone  12.5 mg Oral Daily   tamsulosin  0.4 mg Oral QHS   thiamine  100 mg Oral Daily   Warfarin - Pharmacist Dosing Inpatient   Does not apply q1600   Infusions:   cefTRIAXone (ROCEPHIN)  IV 1 g (12/07/22 2119)   PRN: acetaminophen **OR** acetaminophen, guaiFENesin, melatonin, menthol-cetylpyridinium, nitroGLYCERIN, mouth rinse, phenol, senna-docusate, sodium chloride flush  Assessment: 89yo M with a history of CAD s/p CABG, ICM, HF, ICD, LV thrombus on warfarin, HTN, HLD, T2DM, CKD, BPH. Patient is presenting with weakness and c/f UTI. Warfarin per pharmacy consult placed for  LV Thrombus .  Patient taking warfarin prior to arrival. Home dose is 2.5 mg (5 mg x 0.5) every Mon; 5 mg (5 mg x 1) all other days.  INR today is slightly above goal at 3.2. Paxlovid started which can alter warfarin PK.  Goal of Therapy:  INR Goal 2-3 Monitor platelets by anticoagulation protocol: Yes   Plan:  Warfarin 0.5mg  x1 tonight Daily INR  Fredonia Highland, PharmD, BCPS, [facility] Clinical Pharmacist Please check AMION for all [facility] numbers 12/08/2022

## 2022-12-08 NOTE — Plan of Care (Signed)
Pt doing well on room air.  Problem: Education: Goal: Ability to describe self-care measures that may prevent or decrease complications (Diabetes Survival Skills Education) will improve Outcome: Progressing Goal: Individualized Educational Video(s) Outcome: Progressing   Problem: Coping: Goal: Ability to adjust to condition or change in health will improve Outcome: Progressing   Problem: Fluid Volume: Goal: Ability to maintain a balanced intake and output will improve Outcome: Progressing   Problem: Health Behavior/Discharge Planning: Goal: Ability to identify and utilize available resources and services will improve Outcome: Progressing Goal: Ability to manage health-related needs will improve Outcome: Progressing   Problem: Metabolic: Goal: Ability to maintain appropriate glucose levels will improve Outcome: Progressing   Problem: Nutritional: Goal: Maintenance of adequate nutrition will improve Outcome: Progressing Goal: Progress toward achieving an optimal weight will improve Outcome: Progressing   Problem: Skin Integrity: Goal: Risk for impaired skin integrity will decrease Outcome: Progressing   Problem: Tissue Perfusion: Goal: Adequacy of tissue perfusion will improve Outcome: Progressing   Problem: Education: Goal: Knowledge of risk factors and measures for prevention of condition will improve Outcome: Progressing   Problem: Coping: Goal: Psychosocial and spiritual needs will be supported Outcome: Progressing   Problem: Respiratory: Goal: Will maintain a patent airway Outcome: Progressing Goal: Complications related to the disease process, condition or treatment will be avoided or minimized Outcome: Progressing   Problem: Education: Goal: Knowledge of General Education information will improve Description: Including pain rating scale, medication(s)/side effects and non-pharmacologic comfort measures Outcome: Progressing   Problem: Health  Behavior/Discharge Planning: Goal: Ability to manage health-related needs will improve Outcome: Progressing   Problem: Clinical Measurements: Goal: Ability to maintain clinical measurements within normal limits will improve Outcome: Progressing Goal: Will remain free from infection Outcome: Progressing Goal: Diagnostic test results will improve Outcome: Progressing Goal: Respiratory complications will improve Outcome: Progressing Goal: Cardiovascular complication will be avoided Outcome: Progressing   Problem: Activity: Goal: Risk for activity intolerance will decrease Outcome: Progressing   Problem: Nutrition: Goal: Adequate nutrition will be maintained Outcome: Progressing   Problem: Coping: Goal: Level of anxiety will decrease Outcome: Progressing   Problem: Elimination: Goal: Will not experience complications related to bowel motility Outcome: Progressing Goal: Will not experience complications related to urinary retention Outcome: Progressing   Problem: Pain Management: Goal: General experience of comfort will improve Outcome: Progressing   Problem: Safety: Goal: Ability to remain free from injury will improve Outcome: Progressing   Problem: Skin Integrity: Goal: Risk for impaired skin integrity will decrease Outcome: Progressing

## 2022-12-08 NOTE — Progress Notes (Signed)
Progress Note   Patient: Joseph Malone:[id number] DOB: Jun 15, 1930 DOA: 12/05/2022     1 DOS: the patient was seen and examined on 12/08/2022   Brief hospital course: The patient is a 89 yr old man who states that he has been falling frequently in the last couple of days due to weakness. And when he falls, he is unable to get up by himself. This is new. He is also complaining of myalgias and throat pain. He also has had complaints of dysuria and frequent urination.   He was admitted for acute cystitis and generalized fatigue and weakness. However, shortly after admission he tested positive for COVID. He was started on paxlovid. While the patient was having fevers, cough, myalgias, and a sore throat, He was saturating 95% on room air, demonstrating no respiratory compromise at this time. He is, however, at high risk of decompensation due to his age and his multiple comorbidities.   Overnight the patient has had an increase in oxygen requirements. On he morning of 10/27 he is requiring 2L O2 to maintain saturations of 91 % while he was saturating 95% on room air on admissions. Dexamethasone has been added.  Assessment and Plan: Left ventricular thrombus INR 1.9 on admission. Will continue coumadin and monitor INR.  Ischemic cardiomyopathy Stable. The patient will be continued on his NTG SL 0.4 mg prn chest pain, Zocor, ASA 81 mg. He will be monitored on telemetry.  ICD (implantable cardioverter-defibrillator) in place Noted. The patient will be monitored on telemetry.  Hyperlipidemia Continue simvastatin has been held.  History of CAD (coronary artery disease) Continue ASA, NTG, Symvastatin, ASA 81 mg as at home. The patient will be monitored on telemetry.  Essential hypertension The patient is currently normotensive. Will hold antihypertensives at this time.  COVID-19 virus infection Although on 12/06/2022 the patient has no evidence of respiratory compromise, he is displaying  symptoms of COVID infection with fevers, myalgias, sore throat, and fever. This is also likely behind the patient's complaints of weakness and frequent falls. He will be treated with paxlovid. He will be closely monitored for respiratory issues. He is at high risk of decompensation due to his age and multiple cardiac comorbidities and DM II.   On the morning of 12/07/2022 the patient is demonstrating respiratory compromise and evidence of COVID pneumonia on CXR. He has been started on Dexamethasone. Despite this the patient states that he is feeling better.  Combined congestive systolic and diastolic heart failure (HCC) Noted. The patient's most recent echocardiogram was in 07/2022. It demonstrated an EF of 30-35% with moderately decreased function and regional wall motion abnormalities. At this echo diastolic parameters were indeterminate. His right ventricular systolic function was mildly reduced. Will monitor the patient's volume status carefully.  CKD stage 3b, GFR 30-44 ml/min (HCC) Noted. The patient's renal status appears to be at baseline with a creatinine of 1.20 on admission. Monitor and avoid nephrotoxic agents.  BPH (benign prostatic hyperplasia) Continue tamsulosin as prior to admission.  Acute cystitis Continue IV antibiotics  Generalized weakness The patient will require PT/OT when appropriate.      Subjective: The patient is lresting comfortably in bed. Despite his worsening cough and his new oxygen requirements, he asserts thqt he is feeling better.  Physical Exam: Vitals:   12/08/22 0000 12/08/22 0200 12/08/22 0415 12/08/22 0500  BP:   124/65   Pulse:      Resp:   18   Temp:   97.9 F (36.6 C)  TempSrc:   Oral   SpO2: 93% 94% 94%   Weight:    96.3 kg  Height:       Exam:  Constitutional:  The patient is awake, alert, and oriented x 3. No acute distress. Eyes:  pupils and irises appear normal Normal lids and conjunctivae Mild coryza ENMT:  grossly normal  hearing  Lips appear normal external ears, nose appear normal Oropharynx: mucosa, tongue Posterior oropharynx is erythematous without exudates  Neck:  neck appears normal, no masses, normal ROM, supple no thyromegaly Respiratory:  No increased work of breathing. Positive for rhonchi and rales throughout No tactile fremitus Cardiovascular:  Regular rate and rhythm No murmurs, ectopy, or gallups. No lateral PMI. No thrills. Abdomen:  Abdomen is soft, non-tender, non-distended No hernias, masses, or organomegaly Normoactive bowel sounds.  Musculoskeletal:  No cyanosis, clubbing, or edema Skin:  No rashes, lesions, ulcers palpation of skin: no induration or nodules Neurologic:  CN 2-12 intact Sensation all 4 extremities intact Psychiatric:  Mental status Mood, affect appropriate Orientation to person, place, time  judgment and insight appear intact  Data Reviewed: CBC BMP COVID results. UA  Family Communication: The patient's daughter is on speaker on the patient's phone throughout my visit. All questions answered to the best of my ability.  Disposition: Status is: Inpatient admission The patient will require care spanning > 2 midnights and should be moved to inpatient because: severity of infection, multiple comorbidities, and advanced age  Planned Discharge Destination: Home    Time spent: 38 minutes  Author: Lyzbeth Genrich, DO 12/07/2022 6:45 PM  For on call review [email or web address].

## 2022-12-08 NOTE — Hospital Course (Signed)
Joseph Malone is a 89 y.o. male with a history of CAD status post CABG, ICM, chronic combined systolic and diastolic heart failure, ischemic cardiomyopathy, ICD placement, LV thrombus on Coumadin, hypertension, hyperlipidemia, diabetes mellitus type 2, CKD stage IIIb, BPH, pulmonary nodule.  Patient presented secondary to generalized weakness and UTI symptoms.  Initial evaluation was consistent with likely UTI.  Empiric ceftriaxone started.  Patient was found to have evidence of COVID-19 infection with possible pneumonia on imaging.  Patient started on Paxlovid and Decadron.  Patient also given supplemental oxygen for support.

## 2022-12-08 NOTE — TOC Initial Note (Signed)
Transition of Care [facility]) - Initial/Assessment Note    Patient Details  Name: Joseph Malone MRN: [id number] Date of Birth: February 17, 1930  Transition of Care [facility]) CM/SW Contact:    Delilah Shan, LCSWA Phone Number: 12/08/2022, 3:31 PM  Clinical Narrative:                  CSW received consult for possible SNF placement at time of discharge. CSW spoke with patient regarding PT recommendation of SNF placement at time of discharge. Patient gave CSW permission to speak with his daughter [name]. CSW spoke with [name]. Patients daughter reports PTA patient comes from home alone.Patients daughter expressed understanding of PT recommendation and politely declined SNF placement at time of discharge for patient.Patients daughter would like to speak with CM regarding patients home needs. Patients daughter reports she can provide support for patient at home.No further questions reported at this time. CSW informed CM.TOC to continue to follow and assist with discharge planning needs.   Expected Discharge Plan: Home w Home Health Services Barriers to Discharge: Continued Medical Work up   Patient Goals and CMS Choice Patient states their goals for this hospitalization and ongoing recovery are:: to return home   Choice offered to / list presented to : Patient, Adult Children (Patient and daughter [name])      Expected Discharge Plan and Services In-house Referral: Clinical Social Work     Living arrangements for the past 2 months: Apartment                                      Prior Living Arrangements/Services Living arrangements for the past 2 months: Apartment Lives with:: Self Patient language and need for interpreter reviewed:: Yes Do you feel safe going back to the place where you live?: Yes      Need for Family Participation in Patient Care: Yes (Comment) Care giver support system in place?: Yes (comment)   Criminal Activity/Legal Involvement Pertinent to Current  Situation/Hospitalization: No - Comment as needed  Activities of Daily Living   ADL Screening (condition at time of admission) Independently performs ADLs?: Yes (appropriate for developmental age) Is the patient deaf or have difficulty hearing?: No Does the patient have difficulty seeing, even when wearing glasses/contacts?: No Does the patient have difficulty concentrating, remembering, or making decisions?: No  Permission Sought/Granted Permission sought to share information with : Case Manager, Magazine features editor, Family Supports Permission granted to share information with : Yes, Verbal Permission Granted  Share Information with NAME: [name]     Permission granted to share info w Relationship: daughter  Permission granted to share info w Contact Information: [name] [phone]  Emotional Assessment   Attitude/Demeanor/Rapport: Gracious Affect (typically observed): Calm Orientation: : Oriented to Self, Oriented to Place, Oriented to  Time, Oriented to Situation Alcohol / Substance Use: Not Applicable Psych Involvement: No (comment)  Admission diagnosis:  Acute cystitis [N30.00] Generalized weakness [R53.1] COVID-19 virus infection [U07.1] Patient Active Problem List   Diagnosis Date Noted   COVID-19 virus infection 12/07/2022   Acute cystitis 12/05/2022   Generalized weakness 12/05/2022   History of CAD (coronary artery disease) 12/05/2022   Combined congestive systolic and diastolic heart failure (HCC) 12/05/2022   CKD stage 3b, GFR 30-44 ml/min (HCC) 12/05/2022   BPH (benign prostatic hyperplasia) 12/05/2022   Polyp of ascending colon 01/16/2022   Acute GI bleeding 01/14/2022   GI bleeding 01/13/2022  Left ventricular thrombus 01/13/2022   Chronic combined systolic and diastolic congestive heart failure (HCC) 01/13/2022   Atherosclerosis of aorta (HCC) 05/18/2020   PVC's (premature ventricular contractions) 01/24/2020   Hematochezia    Long term  (current) use of anticoagulants 07/08/2017   Nephrolithiasis 03/24/2016   Erectile dysfunction 01/19/2014   Malignant basal cell neoplasm of skin 09/29/2012   Solitary pulmonary nodule 07/12/2012   Chronic systolic heart failure (HCC) 12/09/2010   Ischemic cardiomyopathy 10/02/2009   ICD (implantable cardioverter-defibrillator) in place 01/16/2009   Non-insulin dependent type 2 diabetes mellitus (HCC) 09/14/2008   BPH without obstruction/lower urinary tract symptoms 10/22/2007   Hyperlipidemia 04/22/2007   Essential hypertension 09/25/2006   CAD in native artery 09/25/2006   Osteoarthritis 09/25/2006   Hematuria 09/25/2006   PCP:  Shelva Majestic, MD Pharmacy:   [facility] ORDER) ELECTRONIC - [place], [place] - [address] [place] [address] Phone: [phone] Fax: [phone]  [facility] - [place], [place] - [address] [place] [address] Phone: [phone] Fax: [phone]  [id number] [place], [place] - [address] AT [facility] [address] [facility] [address] & [facility] [address] [place] [address] Phone: [phone] Fax: [phone]     Social Determinants of Health (SDOH) Social History: SDOH Screenings   Food Insecurity: No Food Insecurity (12/06/2022)  Housing: Low Risk  (12/06/2022)  Transportation Needs: No Transportation Needs (12/06/2022)  Utilities: Not At Risk (12/06/2022)  Depression (PHQ2-9): Low Risk  (10/16/2022)  Financial Resource Strain: Low Risk  (08/21/2022)  Physical Activity: Insufficiently Active (08/21/2022)  Social Connections: Socially Integrated (08/21/2022)  Stress: No Stress Concern Present (08/21/2022)  Tobacco Use: Medium Risk (12/05/2022)  Health Literacy: Adequate Health Literacy (08/21/2022)   SDOH Interventions:     Readmission Risk Interventions     No data to display

## 2022-12-08 NOTE — TOC Progression Note (Signed)
Transition of Care [facility]) - Progression Note    Patient Details  Name: Joseph Malone MRN: [id number] Date of Birth: [birth year]  Transition of Care [facility]) CM/SW Contact  Ronny Bacon, RN Phone Number: 12/08/2022, 3:47 PM  Clinical Narrative: Secure message received from social worker that SNF placement was refused from patient and to call daughter for [facility] arrangements. Spoke with Mrs. Patrum [phone], confirms that her family is able to care for patient at home and is interested in home health services. She used [facility] in the past with her mom and liked how they were. HH PT/OT arranged through [facility] with Frances Furbish. Per daughter patient does not need any DME, has RW, bars in shower and around toilet.      Expected Discharge Plan: Home w Home Health Services Barriers to Discharge: Continued Medical Work up  Expected Discharge Plan and Services In-house Referral: Clinical Social Work     Living arrangements for the past 2 months: Apartment                           HH Arranged: PT, OT HH Agency: [facility] Date [facility] Agency Contacted: 12/08/22 Time HH Agency Contacted: [phone] Representative spoke with at [facility] Agency: Kandee Keen   Social Determinants of Health (SDOH) Interventions SDOH Screenings   Food Insecurity: No Food Insecurity (12/06/2022)  Housing: Low Risk  (12/06/2022)  Transportation Needs: No Transportation Needs (12/06/2022)  Utilities: Not At Risk (12/06/2022)  Depression (PHQ2-9): Low Risk  (10/16/2022)  Financial Resource Strain: Low Risk  (08/21/2022)  Physical Activity: Insufficiently Active (08/21/2022)  Social Connections: Socially Integrated (08/21/2022)  Stress: No Stress Concern Present (08/21/2022)  Tobacco Use: Medium Risk (12/05/2022)  Health Literacy: Adequate Health Literacy (08/21/2022)    Readmission Risk Interventions     No data to display

## 2022-12-08 NOTE — Progress Notes (Signed)
PROGRESS NOTE    Joseph Malone  ZOX:[id number] DOB: [birth year] DOA: 12/05/2022 PCP: Shelva Majestic, MD   Brief Narrative: Joseph Malone is a 89 y.o. male with a history of CAD status post CABG, ICM, chronic combined systolic and diastolic heart failure, ischemic cardiomyopathy, ICD placement, LV thrombus on Coumadin, hypertension, hyperlipidemia, diabetes mellitus type 2, CKD stage IIIb, BPH, pulmonary nodule.  Patient presented secondary to generalized weakness and UTI symptoms.  Initial evaluation was consistent with likely UTI.  Empiric ceftriaxone started.  Patient was found to have evidence of COVID-19 infection with possible pneumonia on imaging.  Patient started on Paxlovid and Decadron.  Patient also given supplemental oxygen for support.   Assessment and Plan:  Acute cystitis Patient with symptoms of urinary urgency and frequency. Urinalysis somewhat suggestive of a UTI. Urine culture obtained and patient started on Ceftriaxone for treatment. Urine culture grew [name] with intermediate sensitivities to ampicillin. -Discontinue Ceftriaxone and start cefadroxil  COVID-19 infection Diagnosed on 12/06/2022. Patient with fever with airspace disease seen on imaging concerning for pneumonia. Patient placed on oxygen. Patient started on Paxlovid and decadron. -Continue Paxlovid and decadron  CAD s/p CABG Noted. -Continue aspirin,   Chronic combined systolic and diastolic heart failure Ischemic cardiomyopathy Stable. -Continue Aspirin and spironolactone  Hyperlipidemia -Continue Lipitor  Primary hypertension -Continue Spironolactone  Left ventricular apical thrombus -Continue Coumadin  Diabetes mellitus type 2 Well controlled with last hemoglobin A1C of 6.1%. Patient is managed on metformin as an outpatient. -Continue SSI  CKD stage IIIb Baseline creatinine appears to be around 1.2-1.3. Mild elevation without AKI. Stable.  BPH -Continue Flomax   DVT  prophylaxis: Eliquis Code Status:   Code Status: Full Code Family Communication: None at bedside Disposition Plan: Discharge to SNF likely in 1-2 days   Consultants:  None  Procedures:  None  Antimicrobials: Ceftriaxone Cefadroxil    Subjective: Patient reports no issues from overnight. Breathing well.  Objective: BP 124/65 (BP Location: Right Arm)   Pulse 95   Temp 97.9 F (36.6 C) (Oral)   Resp 18   Ht 5\' 11"  (1.803 m)   Wt 96.3 kg   SpO2 94%   BMI 29.61 kg/m   Examination:  General exam: Appears calm and comfortable Respiratory system: Clear to auscultation. Respiratory effort normal. Cardiovascular system: S1 & S2 heard, RRR. Gastrointestinal system: Abdomen is nondistended, soft and nontender. Normal bowel sounds heard. Central nervous system: Alert and oriented. No focal neurological deficits. Psychiatry: Judgement and insight appear normal. Mood & affect appropriate.    Data Reviewed: I have personally reviewed following labs and imaging studies  CBC Lab Results  Component Value Date   WBC 10.7 (H) 12/07/2022   RBC 4.70 12/07/2022   HGB 14.6 12/07/2022   HCT 42.3 12/07/2022   MCV 90.0 12/07/2022   MCH 31.1 12/07/2022   PLT 145 (L) 12/07/2022   MCHC 34.5 12/07/2022   RDW 12.3 12/07/2022   LYMPHSABS 1.1 12/07/2022   MONOABS 0.7 12/07/2022   EOSABS 0.0 12/07/2022   BASOSABS 0.0 12/07/2022     Last metabolic panel Lab Results  Component Value Date   NA 131 (L) 12/07/2022   K 3.9 12/07/2022   CL 98 12/07/2022   CO2 22 12/07/2022   BUN 25 (H) 12/07/2022   CREATININE 1.39 (H) 12/07/2022   GLUCOSE 131 (H) 12/07/2022   GFRNONAA 48 (L) 12/07/2022   GFRAA 46 (L) 10/01/2018   CALCIUM 8.5 (L) 12/07/2022   PROT 6.5 12/07/2022  ALBUMIN 3.0 (L) 12/07/2022   BILITOT 0.5 12/07/2022   ALKPHOS 44 12/07/2022   AST 35 12/07/2022   ALT 24 12/07/2022   ANIONGAP 11 12/07/2022    GFR: Estimated Creatinine Clearance: 41.8 mL/min (A) (by C-G formula  based on SCr of 1.39 mg/dL (H)).  Recent Results (from the past 240 hour(s))  Blood culture (routine x 2)     Status: None (Preliminary result)   Collection Time: 12/05/22  9:20 PM   Specimen: BLOOD RIGHT HAND  Result Value Ref Range Status   Specimen Description BLOOD RIGHT HAND  Final   Special Requests   Final    BOTTLES DRAWN AEROBIC AND ANAEROBIC Blood Culture adequate volume   Culture   Final    NO GROWTH 3 DAYS Performed at [facility], [address], [place], [place] [address]    Report Status PENDING  Incomplete  Blood culture (routine x 2)     Status: None (Preliminary result)   Collection Time: 12/05/22  9:39 PM   Specimen: BLOOD LEFT ARM  Result Value Ref Range Status   Specimen Description BLOOD LEFT ARM  Final   Special Requests   Final    BOTTLES DRAWN AEROBIC AND ANAEROBIC Blood Culture results may not be optimal due to an excessive volume of blood received in culture bottles   Culture   Final    NO GROWTH 3 DAYS Performed at [facility], [address] [place], [place] [address]    Report Status PENDING  Incomplete  Urine Culture     Status: Abnormal   Collection Time: 12/06/22  1:00 AM   Specimen: Urine, Catheterized  Result Value Ref Range Status   Specimen Description URINE, CATHETERIZED  Final   Special Requests   Final    NONE Performed at [facility], [address], [place], [place] [address]    Culture 70,000 COLONIES/mL ESCHERICHIA COLI (A)  Final   Report Status 12/08/2022 FINAL  Final   Organism ID, Bacteria ESCHERICHIA COLI (A)  Final      Susceptibility   Escherichia coli - MIC*    AMPICILLIN 16 INTERMEDIATE Intermediate     CEFAZOLIN <=4 SENSITIVE Sensitive     CEFEPIME <=0.12 SENSITIVE Sensitive     CEFTRIAXONE <=0.25 SENSITIVE Sensitive     CIPROFLOXACIN <=0.25 SENSITIVE Sensitive     GENTAMICIN <=1 SENSITIVE Sensitive     IMIPENEM 0.5 SENSITIVE Sensitive     NITROFURANTOIN <=16 SENSITIVE Sensitive      TRIMETH/SULFA <=20 SENSITIVE Sensitive     AMPICILLIN/SULBACTAM 8 SENSITIVE Sensitive     PIP/TAZO <=4 SENSITIVE Sensitive ug/mL    * 70,000 COLONIES/mL ESCHERICHIA COLI  Resp panel by RT-PCR (RSV, Flu A&B, Covid) Anterior Nasal Swab     Status: Abnormal   Collection Time: 12/06/22  5:09 AM   Specimen: Anterior Nasal Swab  Result Value Ref Range Status   SARS Coronavirus 2 by RT PCR POSITIVE (A) NEGATIVE Final   Influenza A by PCR NEGATIVE NEGATIVE Final   Influenza B by PCR NEGATIVE NEGATIVE Final    Comment: (NOTE) The Xpert Xpress SARS-CoV-2/FLU/RSV plus assay is intended as an aid in the diagnosis of influenza from Nasopharyngeal swab specimens and should not be used as a sole basis for treatment. Nasal washings and aspirates are unacceptable for Xpert Xpress SARS-CoV-2/FLU/RSV testing.  Fact Sheet for Patients: BloggerCourse.com  Fact Sheet for Healthcare Providers: SeriousBroker.it  This test is not yet approved or cleared by  the [employer] and has been authorized for detection and/or diagnosis of SARS-CoV-2 by FDA under an Emergency Use Authorization (EUA). This EUA will remain in effect (meaning this test can be used) for the duration of the COVID-19 declaration under Section 564(b)(1) of the Act, 21 U.S.C. section 360bbb-3(b)(1), unless the authorization is terminated or revoked.     Resp Syncytial Virus by PCR NEGATIVE NEGATIVE Final    Comment: (NOTE) Fact Sheet for Patients: BloggerCourse.com  Fact Sheet for Healthcare Providers: SeriousBroker.it  This test is not yet approved or cleared by the [place] FDA and has been authorized for detection and/or diagnosis of SARS-CoV-2 by FDA under an Emergency Use Authorization (EUA). This EUA will remain in effect (meaning this test can be used) for the duration of the COVID-19 declaration under Section  564(b)(1) of the Act, 21 U.S.C. section 360bbb-3(b)(1), unless the authorization is terminated or revoked.  Performed at [facility], [address] [place], [place] [address]       Radiology Studies: DG CHEST PORT 1 VIEW  Result Date: 12/07/2022 CLINICAL DATA:  Cough COVID EXAM: PORTABLE CHEST 1 VIEW COMPARISON:  12/05/2022, 07/05/2022 FINDINGS: Post sternotomy changes. Left-sided cardiac pacing device as before. Interim development of heterogeneous lower lung airspace opacities. No pleural effusion. Stable cardiomediastinal silhouette. No pneumothorax IMPRESSION: Interim development of heterogeneous lower lung airspace opacities suspicious for pneumonia Electronically Signed   By: Jasmine Pang M.D.   On: 12/07/2022 00:56      LOS: 1 day    Jacquelin Hawking, MD [facility] 12/08/2022, 9:27 AM   If 7PM-7AM, please contact night-coverage [email or web address]

## 2022-12-08 NOTE — Assessment & Plan Note (Signed)
The patient will require PT/OT when appropriate.

## 2022-12-09 DIAGNOSIS — U071 COVID-19: Secondary | ICD-10-CM | POA: Diagnosis not present

## 2022-12-09 LAB — BASIC METABOLIC PANEL
Anion gap: 14 (ref 5–15)
BUN: 48 mg/dL — ABNORMAL HIGH (ref 8–23)
CO2: 20 mmol/L — ABNORMAL LOW (ref 22–32)
Calcium: 8.9 mg/dL (ref 8.9–10.3)
Chloride: 102 mmol/L (ref 98–111)
Creatinine, Ser: 1.43 mg/dL — ABNORMAL HIGH (ref 0.61–1.24)
GFR, Estimated: 47 mL/min — ABNORMAL LOW (ref >60)
Glucose, Bld: 277 mg/dL — ABNORMAL HIGH (ref 70–99)
Potassium: 4.3 mmol/L (ref 3.5–5.1)
Sodium: 136 mmol/L (ref 135–145)

## 2022-12-09 LAB — GLUCOSE, CAPILLARY
Glucose-Capillary: 240 mg/dL — ABNORMAL HIGH (ref 70–99)
Glucose-Capillary: 283 mg/dL — ABNORMAL HIGH (ref 70–99)
Glucose-Capillary: 197 mg/dL — ABNORMAL HIGH (ref 70–99)
Glucose-Capillary: 338 mg/dL — ABNORMAL HIGH (ref 70–99)

## 2022-12-09 LAB — HEMOGLOBIN AND HEMATOCRIT, BLOOD
HCT: 41.2 % (ref 39.0–52.0)
Hemoglobin: 14.1 g/dL (ref 13.0–17.0)

## 2022-12-09 LAB — PROTIME-INR
INR: 3.2 — ABNORMAL HIGH (ref 0.8–1.2)
Prothrombin Time: 33 s — ABNORMAL HIGH (ref 11.4–15.2)

## 2022-12-09 LAB — C-REACTIVE PROTEIN: CRP: 8.5 mg/dL — ABNORMAL HIGH (ref <1.0)

## 2022-12-09 MED ORDER — SODIUM CHLORIDE 0.9 % IV SOLN: INTRAVENOUS | Status: AC

## 2022-12-09 MED ORDER — WARFARIN SODIUM 2 MG PO TABS
2.0000 mg | ORAL_TABLET | Freq: Once | ORAL | Status: AC
  Administered 2022-12-09: 2 mg via ORAL
  Filled 2022-12-09: qty 1

## 2022-12-09 NOTE — Progress Notes (Signed)
Physical Therapy Treatment Patient Details Name: MEREDITH HOESE MRN: [id number] DOB: December 25, 1930 Today's Date: 12/09/2022   History of Present Illness Pt is a 89 y/o male presenting 10/25 with weakness and fall from bed. Admitted for UTI, acute cystitis and found to be COVID+. PMH: CAD s/p CABG x 1, ICM, CHF, ischemic cardiomyopathy, ICD, LV thrombus, HTN, HLD, DM, CKD, BPH and pulmonary nodules.    PT Comments  Pt demonstrating good mobility progression this date, ambulating 150 ft in room before needing to rest and ambulates without AD. Pt is overall supervision for mobility at this time, would be helpful for pt to have increased support from his daughter once d/c. SpO2 91% and greater on RA, HR elevation to 110s with activity. PT to continue to follow.       If plan is discharge home, recommend the following: Help with stairs or ramp for entrance;A little help with bathing/dressing/bathroom;Direct supervision/assist for medications management   Can travel by private vehicle        Equipment Recommendations  None recommended by PT    Recommendations for Other Services       Precautions / Restrictions Precautions Precautions: Fall Restrictions Weight Bearing Restrictions: No     Mobility  Bed Mobility Overal bed mobility: Modified Independent Bed Mobility: Supine to Sit, Sit to Supine     Supine to sit: Modified independent (Device/Increase time) Sit to supine: Modified independent (Device/Increase time)        Transfers Overall transfer level: Needs assistance Equipment used: None Transfers: Sit to/from Stand Sit to Stand: Supervision           General transfer comment: for safety, slow to rise but no physical assist    Ambulation/Gait Ambulation/Gait assistance: Supervision Gait Distance (Feet): 150 Feet Assistive device: None Gait Pattern/deviations: Step-through pattern, Decreased stride length Gait velocity: decr     General Gait Details: no AD use,  increased time but no overt unsteadiness and navigates around obstacles in room well. SpO2 91% and greater on RA, HR elevation to 110s with activity   Stairs             Wheelchair Mobility     Tilt Bed    Modified Rankin (Stroke Patients Only)       Balance Overall balance assessment: Needs assistance Sitting-balance support: No upper extremity supported, Feet supported Sitting balance-Leahy Scale: Fair     Standing balance support: No upper extremity supported, During functional activity Standing balance-Leahy Scale: Fair                              Cognition Arousal: Alert Behavior During Therapy: WFL for tasks assessed/performed Overall Cognitive Status: Impaired/Different from baseline Area of Impairment: Problem solving, Safety/judgement                         Safety/Judgement: Decreased awareness of safety   Problem Solving: Requires verbal cues, Slow processing General Comments: tangential in conversation        Exercises      General Comments        Pertinent Vitals/Pain Pain Assessment Pain Assessment: Faces Faces Pain Scale: No hurt Pain Intervention(s): Monitored during session    Home Living                          Prior Function  PT Goals (current goals can now be found in the care plan section) Acute Rehab PT Goals Patient Stated Goal: get well PT Goal Formulation: With patient Time For Goal Achievement: 12/20/22 Potential to Achieve Goals: Good Progress towards PT goals: Progressing toward goals    Frequency    Min 1X/week      PT Plan      Co-evaluation              AM-PAC PT "6 Clicks" Mobility   Outcome Measure  Help needed turning from your back to your side while in a flat bed without using bedrails?: None Help needed moving from lying on your back to sitting on the side of a flat bed without using bedrails?: None Help needed moving to and from a bed to a  chair (including a wheelchair)?: A Little Help needed standing up from a chair using your arms (e.g., wheelchair or bedside chair)?: A Little Help needed to walk in hospital room?: A Little Help needed climbing 3-5 steps with a railing? : A Little 6 Click Score: 20    End of Session   Activity Tolerance: Patient tolerated treatment well Patient left: in bed;with call bell/phone within reach;with bed alarm set Nurse Communication: Mobility status PT Visit Diagnosis: Unsteadiness on feet (R26.81);History of falling (Z91.81);Difficulty in walking, not elsewhere classified (R26.2)     Time: 1478-2956 PT Time Calculation (min) (ACUTE ONLY): 23 min  Charges:    $Gait Training: 8-22 mins $Therapeutic Activity: 8-22 mins PT General Charges $$ ACUTE PT VISIT: 1 Visit                     Marye Round, PT DPT Acute Rehabilitation Services Secure Chat Preferred  Office [phone]    Londen Bok E Christain [place] 12/09/2022, 1:39 PM

## 2022-12-09 NOTE — Progress Notes (Signed)
PROGRESS NOTE    Joseph Malone  WUJ:[id number] DOB: 11/03/1930 DOA: 12/05/2022 PCP: Shelva Majestic, MD   Brief Narrative: Joseph Malone is a 89 y.o. male with a history of CAD status post CABG, ICM, chronic combined systolic and diastolic heart failure, ischemic cardiomyopathy, ICD placement, LV thrombus on Coumadin, hypertension, hyperlipidemia, diabetes mellitus type 2, CKD stage IIIb, BPH, pulmonary nodule.  Patient presented secondary to generalized weakness and UTI symptoms.  Initial evaluation was consistent with likely UTI.  Empiric ceftriaxone started.  Patient was found to have evidence of COVID-19 infection with possible pneumonia on imaging.  Patient started on Paxlovid and Decadron.  Patient also given supplemental oxygen for support.   Assessment and Plan:  Acute cystitis Patient with symptoms of urinary urgency and frequency. Urinalysis somewhat suggestive of a UTI. Urine culture obtained and patient started on Ceftriaxone for treatment. Urine culture grew [name] with intermediate sensitivities to ampicillin. -Continue cefadroxil  COVID-19 infection Diagnosed on 12/06/2022. Patient with fever with airspace disease seen on imaging concerning for pneumonia. Patient placed on oxygen. Patient started on Paxlovid and decadron. Repeat CRP obtained today and is down to 8.5 -Continue Paxlovid and decadron -CRP in AM  CAD s/p CABG Noted. -Continue aspirin,   Chronic combined systolic and diastolic heart failure Ischemic cardiomyopathy Stable. -Continue Aspirin and spironolactone  Hyperlipidemia -Continue Lipitor  Primary hypertension -Continue Spironolactone  Left ventricular apical thrombus -Continue Coumadin  Thrombocytopenia Mild.  Diabetes mellitus type 2 Well controlled with last hemoglobin A1C of 6.1%. Patient is managed on metformin as an outpatient. -Continue SSI  CKD stage IIIb Baseline creatinine appears to be around 1.2-1.3. Mild elevation  without AKI but also with significantly elevated BUN and associated mildly worsened CO2. No evidence of bleeding -IV fluids -BMP in AM -Check hemoglobin/hematocrit to assess BUN elevation and recheck CBC in AM  BPH -Continue Flomax   DVT prophylaxis: Eliquis Code Status:   Code Status: Full Code Family Communication: None at bedside Disposition Plan: Discharge home (patient declines SNF) likely in 1 day pending stable renal function   Consultants:  None  Procedures:  None  Antimicrobials: Ceftriaxone Cefadroxil    Subjective: No concerns this morning.   Objective: BP (!) 151/86 (BP Location: Right Arm)   Pulse 87   Temp (!) 97.5 F (36.4 C) (Oral)   Resp 16   Ht 5\' 11"  (1.803 m)   Wt 96.5 kg   SpO2 98%   BMI 29.67 kg/m   Examination:  General exam: Appears calm and comfortable Respiratory system: Clear to anterior auscultation. Respiratory effort normal. Cardiovascular system: S1 & S2 heard, RRR. Gastrointestinal system: Abdomen is nondistended, soft and nontender. Normal bowel sounds heard. Central nervous system: Alert and oriented. No focal neurological deficits. Psychiatry: Judgement and insight appear normal. Mood & affect appropriate.    Data Reviewed: I have personally reviewed following labs and imaging studies  CBC Lab Results  Component Value Date   WBC 10.7 (H) 12/07/2022   RBC 4.70 12/07/2022   HGB 14.6 12/07/2022   HCT 42.3 12/07/2022   MCV 90.0 12/07/2022   MCH 31.1 12/07/2022   PLT 145 (L) 12/07/2022   MCHC 34.5 12/07/2022   RDW 12.3 12/07/2022   LYMPHSABS 1.1 12/07/2022   MONOABS 0.7 12/07/2022   EOSABS 0.0 12/07/2022   BASOSABS 0.0 12/07/2022     Last metabolic panel Lab Results  Component Value Date   NA 136 12/09/2022   K 4.3 12/09/2022   CL 102  12/09/2022   CO2 20 (L) 12/09/2022   BUN 48 (H) 12/09/2022   CREATININE 1.43 (H) 12/09/2022   GLUCOSE 277 (H) 12/09/2022   GFRNONAA 47 (L) 12/09/2022   GFRAA 46 (L)  10/01/2018   CALCIUM 8.9 12/09/2022   PROT 6.5 12/07/2022   ALBUMIN 3.0 (L) 12/07/2022   BILITOT 0.5 12/07/2022   ALKPHOS 44 12/07/2022   AST 35 12/07/2022   ALT 24 12/07/2022   ANIONGAP 14 12/09/2022    GFR: Estimated Creatinine Clearance: 40.7 mL/min (A) (by C-G formula based on SCr of 1.43 mg/dL (H)).  Recent Results (from the past 240 hour(s))  Blood culture (routine x 2)     Status: None (Preliminary result)   Collection Time: 12/05/22  9:20 PM   Specimen: BLOOD RIGHT HAND  Result Value Ref Range Status   Specimen Description BLOOD RIGHT HAND  Final   Special Requests   Final    BOTTLES DRAWN AEROBIC AND ANAEROBIC Blood Culture adequate volume   Culture   Final    NO GROWTH 3 DAYS Performed at [facility], [address], [place], [place] [address]    Report Status PENDING  Incomplete  Blood culture (routine x 2)     Status: None (Preliminary result)   Collection Time: 12/05/22  9:39 PM   Specimen: BLOOD LEFT ARM  Result Value Ref Range Status   Specimen Description BLOOD LEFT ARM  Final   Special Requests   Final    BOTTLES DRAWN AEROBIC AND ANAEROBIC Blood Culture results may not be optimal due to an excessive volume of blood received in culture bottles   Culture   Final    NO GROWTH 3 DAYS Performed at [facility], [address], [place], [place] [address]    Report Status PENDING  Incomplete  Urine Culture     Status: Abnormal   Collection Time: 12/06/22  1:00 AM   Specimen: Urine, Catheterized  Result Value Ref Range Status   Specimen Description URINE, CATHETERIZED  Final   Special Requests   Final    NONE Performed at [facility], [address], [place], [place] [address]    Culture 70,000 COLONIES/mL ESCHERICHIA COLI (A)  Final   Report Status 12/08/2022 FINAL  Final   Organism ID, Bacteria ESCHERICHIA COLI (A)  Final      Susceptibility   Escherichia coli - MIC*    AMPICILLIN 16 INTERMEDIATE Intermediate     CEFAZOLIN <=4  SENSITIVE Sensitive     CEFEPIME <=0.12 SENSITIVE Sensitive     CEFTRIAXONE <=0.25 SENSITIVE Sensitive     CIPROFLOXACIN <=0.25 SENSITIVE Sensitive     GENTAMICIN <=1 SENSITIVE Sensitive     IMIPENEM 0.5 SENSITIVE Sensitive     NITROFURANTOIN <=16 SENSITIVE Sensitive     TRIMETH/SULFA <=20 SENSITIVE Sensitive     AMPICILLIN/SULBACTAM 8 SENSITIVE Sensitive     PIP/TAZO <=4 SENSITIVE Sensitive ug/mL    * 70,000 COLONIES/mL ESCHERICHIA COLI  Resp panel by RT-PCR (RSV, Flu A&B, Covid) Anterior Nasal Swab     Status: Abnormal   Collection Time: 12/06/22  5:09 AM   Specimen: Anterior Nasal Swab  Result Value Ref Range Status   SARS Coronavirus 2 by RT PCR POSITIVE (A) NEGATIVE Final   Influenza A by PCR NEGATIVE NEGATIVE Final   Influenza B by PCR NEGATIVE NEGATIVE Final    Comment: (NOTE) The Xpert Xpress SARS-CoV-2/FLU/RSV plus assay is intended as an aid in the diagnosis of influenza  from Nasopharyngeal swab specimens and should not be used as a sole basis for treatment. Nasal washings and aspirates are unacceptable for Xpert Xpress SARS-CoV-2/FLU/RSV testing.  Fact Sheet for Patients: BloggerCourse.com  Fact Sheet for Healthcare Providers: SeriousBroker.it  This test is not yet approved or cleared by the [place] FDA and has been authorized for detection and/or diagnosis of SARS-CoV-2 by FDA under an Emergency Use Authorization (EUA). This EUA will remain in effect (meaning this test can be used) for the duration of the COVID-19 declaration under Section 564(b)(1) of the Act, 21 U.S.C. section 360bbb-3(b)(1), unless the authorization is terminated or revoked.     Resp Syncytial Virus by PCR NEGATIVE NEGATIVE Final    Comment: (NOTE) Fact Sheet for Patients: BloggerCourse.com  Fact Sheet for Healthcare Providers: SeriousBroker.it  This test is not yet approved or cleared  by the [place] FDA and has been authorized for detection and/or diagnosis of SARS-CoV-2 by FDA under an Emergency Use Authorization (EUA). This EUA will remain in effect (meaning this test can be used) for the duration of the COVID-19 declaration under Section 564(b)(1) of the Act, 21 U.S.C. section 360bbb-3(b)(1), unless the authorization is terminated or revoked.  Performed at [facility], [address], [place], [place] [address]       Radiology Studies: No results found.    LOS: 2 days    Jacquelin Hawking, MD [facility] 12/09/2022, 11:56 AM   If 7PM-7AM, please contact night-coverage [email or web address]

## 2022-12-09 NOTE — Progress Notes (Signed)
Occupational Therapy Treatment Patient Details Name: Joseph Malone MRN: [id number] DOB: 06-06-1930 Today's Date: 12/09/2022   History of present illness Pt is a 89 y/o male presenting 10/25 with weakness and fall from bed. Admitted for UTI, acute cystitis and found to be COVID+. PMH: CAD s/p CABG x 1, ICM, CHF, ischemic cardiomyopathy, ICD, LV thrombus, HTN, HLD, DM, CKD, BPH and pulmonary nodules.   OT comments  Pt making excellent progress towards OT goals. Pt able to mobilize in room with RW initially progressing to no AD without overt LOB. Pt able to manage various ADLs in standing w/o safety concerns. Pt interested in [facility] therapies at DC and feel this is appropriate. VSS on RA.      If plan is discharge home, recommend the following:  A little help with bathing/dressing/bathroom;Assistance with cooking/housework;Assist for transportation   Equipment Recommendations  None recommended by OT (pt looking into shower chair at DC)    Recommendations for Other Services      Precautions / Restrictions Precautions Precautions: Fall Restrictions Weight Bearing Restrictions: No       Mobility Bed Mobility Overal bed mobility: Modified Independent Bed Mobility: Supine to Sit                Transfers Overall transfer level: Modified independent Equipment used: Rolling walker (2 wheels), None Transfers: Sit to/from Stand Sit to Stand: Modified independent (Device/Increase time)                 Balance Overall balance assessment: Needs assistance Sitting-balance support: No upper extremity supported, Feet supported Sitting balance-Leahy Scale: Normal     Standing balance support: Bilateral upper extremity supported, No upper extremity supported, During functional activity Standing balance-Leahy Scale: Good                             ADL either performed or assessed with clinical judgement   ADL Overall ADL's : Needs assistance/impaired      Grooming: Supervision/safety;Standing;Wash/dry face;Oral care           Upper Body Dressing : Supervision/safety;Standing Upper Body Dressing Details (indicate cue type and reason): donning gown around backside                 Functional mobility during ADLs: Contact guard assist;Rolling walker (2 wheels) General ADL Comments: trial with RW and without for functional mobility in room, discussion of self monitoring deficits and when DME may be needed. Pt reports likely able to borrow shower chair but also looking into getting his own    Extremity/Trunk Assessment Upper Extremity Assessment Upper Extremity Assessment: Overall WFL for tasks assessed;Right hand dominant   Lower Extremity Assessment Lower Extremity Assessment: Defer to PT evaluation        Vision   Vision Assessment?: No apparent visual deficits   Perception     Praxis      Cognition Arousal: Alert Behavior During Therapy: WFL for tasks assessed/performed Overall Cognitive Status: Impaired/Different from baseline Area of Impairment: Awareness, Problem solving                           Awareness: Emergent Problem Solving: Difficulty sequencing, Requires verbal cues General Comments: much improved cognition and able to show insight into deficits, when DME may be needed. does need some problem solving cues to manage primofit line, locate ADL items at sink but overall functional  Exercises      Shoulder Instructions       General Comments      Pertinent Vitals/ Pain       Pain Assessment Pain Assessment: No/denies pain  Home Living                                          Prior Functioning/Environment              Frequency  Min 1X/week        Progress Toward Goals  OT Goals(current goals can now be found in the care plan section)  Progress towards OT goals: Progressing toward goals  Acute Rehab OT Goals Patient Stated Goal: happy to  potentially go home today OT Goal Formulation: With patient Time For Goal Achievement: 12/20/22 Potential to Achieve Goals: Good ADL Goals Pt Will Perform Lower Body Dressing: with mod assist;sitting/lateral leans;sit to/from stand Pt Will Transfer to Toilet: with min assist;stand pivot transfer;bedside commode Additional ADL Goal #1: Pt to complete bed mobility with Min A in prep for EOB/OOB ADLs  Plan      Co-evaluation                 AM-PAC OT "6 Clicks" Daily Activity     Outcome Measure   Help from another person eating meals?: None Help from another person taking care of personal grooming?: A Little Help from another person toileting, which includes using toliet, bedpan, or urinal?: A Little Help from another person bathing (including washing, rinsing, drying)?: A Little Help from another person to put on and taking off regular upper body clothing?: A Little Help from another person to put on and taking off regular lower body clothing?: A Little 6 Click Score: 19    End of Session Equipment Utilized During Treatment: Rolling walker (2 wheels)  OT Visit Diagnosis: Other abnormalities of gait and mobility (R26.89);Unsteadiness on feet (R26.81);Muscle weakness (generalized) (M62.81)   Activity Tolerance Patient tolerated treatment well   Patient Left in chair;with call bell/phone within reach;with chair alarm set;with nursing/sitter in room   Nurse Communication Mobility status        Time: 6962-9528 OT Time Calculation (min): 21 min  Charges: OT General Charges $OT Visit: 1 Visit OT Treatments $Self Care/Home Management : 8-22 mins  Bradd Canary, OTR/L Acute Rehab Services Office: [phone]   Lorre Munroe 12/09/2022, 11:51 AM

## 2022-12-09 NOTE — Progress Notes (Signed)
Ambulated patient on room air; patient's oxygen saturations stayed >92%. Patient did not complain of any shortness of breath. Patient with steady gait.

## 2022-12-09 NOTE — Inpatient Diabetes Management (Signed)
Inpatient Diabetes Program Recommendations  AACE/ADA: New Consensus Statement on Inpatient Glycemic Control (2015)  Target Ranges:  Prepandial:   less than 140 mg/dL      Peak postprandial:   less than 180 mg/dL (1-2 hours)      Critically ill patients:  140 - 180 mg/dL   Lab Results  Component Value Date   GLUCAP 283 (H) 12/09/2022   HGBA1C 6.1 10/16/2022    Review of Glycemic Control  Latest Reference Range & Units 12/08/22 11:59 12/08/22 16:37 12/08/22 21:04 12/09/22 07:56 12/09/22 11:26  Glucose-Capillary 70 - 99 mg/dL 161 (H) 096 (H) 045 (H) 240 (H) 283 (H)   Diabetes history: DM 2 Outpatient Diabetes medications:  Metformin 500 mg with breakfast Current orders for Inpatient glycemic control:  Novolog 0-6 units tid with meals and HS Decadron 6 mg daily x 10 days Inpatient Diabetes Program Recommendations:    Consider increasing Novolog to moderate tid with meals and HS.  Also consider adding Semglee 8 units daily while on steroids.   Thanks,  Beryl Meager, RN, BC-ADM Inpatient Diabetes Coordinator Pager [phone]  (8a-5p)

## 2022-12-09 NOTE — Progress Notes (Signed)
ANTICOAGULATION CONSULT NOTE  Pharmacy Consult for Warfarin Indication:  LV Thrombus  Allergies  Allergen Reactions   Penicillins Rash and Other (See Comments)    Because of a history of documented adverse serious drug reaction;Medi Alert bracelet  is recommended Has patient had a PCN reaction causing immediate rash, facial/tongue/throat swelling, SOB or lightheadedness with hypotension: No Has patient had a PCN reaction causing severe rash involving mucus membranes or skin necrosis: No Has patient had a PCN reaction that required hospitalization: No Has patient had a PCN reaction occurring within the last 10 years: No If all of the above answers are "NO", t    Patient Measurements: Height: 5\' 11"  (180.3 cm) Weight: 96.5 kg (212 lb 11.9 oz) IBW/kg (Calculated) : 75.3  Vital Signs: Temp: 98.3 F (36.8 C) (10/29 0757) Temp Source: Oral (10/29 0757) BP: 150/82 (10/29 0757) Pulse Rate: 79 (10/29 0757)  Labs: Recent Labs    12/06/22 1430 12/07/22 0414 12/08/22 0714 12/09/22 0453  HGB 15.2 14.6  --   --   HCT 44.9 42.3  --   --   PLT 155 145*  --   --   LABPROT  --  26.7* 32.8* 33.0*  INR  --  2.4* 3.2* 3.2*  CREATININE 1.47* 1.39*  --   --     Estimated Creatinine Clearance: 41.9 mL/min (A) (by C-G formula based on SCr of 1.39 mg/dL (H)).   Medical History: Past Medical History:  Diagnosis Date   Anxiety    BPH (benign prostatic hypertrophy)    CAD (coronary artery disease)    s/p CABG   CHF (congestive heart failure) (HCC)    Diabetes 1.5, managed as type 2 (HCC)    Diverticulosis    Diverticulosis of colon without hemorrhage 10/22/2007   Qualifier: Diagnosis of  By: Alwyn Ren MD, Chrissie Noa     DOE (dyspnea on exertion)    Excess weight    Fasting hyperglycemia    Generalized weakness    GERD (gastroesophageal reflux disease)    Heart attack (HCC) 2001   Heart failure    CHF due to ischemic CM   History of kidney stones    Hyperlipidemia    Hypertension     Ischemic cardiomyopathy    Microscopic hematuria    [facility]   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      Cholecalciferol (VITAMIN D3) 2000 UNITS TABS Take 2,000 Units by mouth daily.      folic acid (FOLVITE) 400 MCG tablet Take 400 mcg by mouth daily.      Glucosamine HCl 1500 MG TABS Take 1,500 mg by mouth every evening.    12/04/2022 at pm   Melatonin 5 MG CHEW Chew 1 tablet by mouth daily as needed.   12/04/2022   metFORMIN (GLUCOPHAGE-XR) 500 MG 24 hr tablet TAKE 1 TABLET(500 MG) BY MOUTH DAILY WITH BREAKFAST 90 tablet 3 12/05/2022   Multiple Vitamin (MULTIVITAMIN WITH MINERALS) TABS Take 1 tablet by mouth daily.    12/04/2022 at pm   nitroGLYCERIN (NITROSTAT) 0.4 MG SL tablet Place 1 tablet (0.4 mg total) under the tongue every 5 (five) minutes as needed for chest pain. 1 tablet every 5 minutes for 3 dose only 90 tablet 3 on hand   simvastatin (ZOCOR) 40 MG tablet Take  0.5 tablets (20 mg total) by mouth at bedtime. 45 tablet 3 12/04/2022 at pm   spironolactone (ALDACTONE) 25 MG tablet Take 0.5 tablets (12.5 mg total) by mouth daily. 90 tablet 3 12/04/2022 at pm   tamsulosin (FLOMAX) 0.4 MG CAPS capsule Take 0.4 mg by mouth at bedtime.   12/04/2022   vitamin B-12 (CYANOCOBALAMIN) 100 MCG tablet Take 100 mcg by mouth daily.   12/04/2022   warfarin (COUMADIN) 5 MG tablet TAKE 1 TABLET BY MOUTH ON MONDAY, WEDNESDAY, AND FRIDAY, AND TAKE ONE-HALF TABLET ALL OTHER DAYS OR AS DIRECTED (Patient taking differently: Take 2.5-5 mg by mouth See admin instructions. Take 1/2 tablet by mouth on Monday and 1 tablet all other days or as directed by [facility]mad[facility] Clinic) 100 tablet 1 12/04/2022   Scheduled:   aspirin  81 mg Oral QPM   cefadroxil  1,000 mg Oral BID   dexamethasone  6 mg Oral Q24H   folic acid  1 mg Oral Daily    insulin aspart  0-5 Units Subcutaneous QHS   insulin aspart  0-6 Units Subcutaneous TID WC   multivitamin with minerals  1 tablet Oral Daily   nirmatrelvir/ritonavir (renal dosing)  2 tablet Oral BID   sodium chloride flush  3 mL Intravenous Q12H   spironolactone  12.5 mg Oral Daily   tamsulosin  0.4 mg Oral QHS   thiamine  100 mg Oral Daily   Warfarin - Pharmacist Dosing Inpatient   Does not apply q1600   Infusions:    PRN: acetaminophen **OR** acetaminophen, guaiFENesin, melatonin, menthol-cetylpyridinium, nitroGLYCERIN, mouth rinse, phenol, senna-docusate, sodium chloride flush  Assessment: 89yo M with a history of CAD s/p CABG, ICM, HF, ICD, LV thrombus on warfarin, HTN, HLD, T2DM, CKD, BPH. Patient is presenting with weakness and c/f UTI. Warfarin per pharmacy consult placed for  LV Thrombus .  Patient taking warfarin prior to arrival. Home dose is 2.5 mg (5 mg x 0.5) every Mon; 5 mg (5 mg x 1) all other days.  INR today remains slightly above goal at 3.2. Paxlovid started which can alter warfarin PK.  Goal of Therapy:  INR Goal 2-3 Monitor platelets by anticoagulation protocol: Yes   Plan:  Warfarin 2mg  x1 tonight Daily INR  Fredonia Highland, PharmD, BCPS, [facility] Clinical Pharmacist Please check AMION for all [facility] numbers 12/09/2022

## 2022-12-10 ENCOUNTER — Other Ambulatory Visit (HOSPITAL_COMMUNITY): Payer: Self-pay

## 2022-12-10 DIAGNOSIS — U071 COVID-19: Secondary | ICD-10-CM | POA: Diagnosis not present

## 2022-12-10 LAB — CULTURE, BLOOD (ROUTINE X 2)
Culture: NO GROWTH
Culture: NO GROWTH
Special Requests: ADEQUATE

## 2022-12-10 LAB — BASIC METABOLIC PANEL
Anion gap: 6 (ref 5–15)
BUN: 42 mg/dL — ABNORMAL HIGH (ref 8–23)
CO2: 20 mmol/L — ABNORMAL LOW (ref 22–32)
Calcium: 8.6 mg/dL — ABNORMAL LOW (ref 8.9–10.3)
Chloride: 107 mmol/L (ref 98–111)
Creatinine, Ser: 1.44 mg/dL — ABNORMAL HIGH (ref 0.61–1.24)
GFR, Estimated: 46 mL/min — ABNORMAL LOW (ref >60)
Glucose, Bld: 227 mg/dL — ABNORMAL HIGH (ref 70–99)
Potassium: 4.6 mmol/L (ref 3.5–5.1)
Sodium: 133 mmol/L — ABNORMAL LOW (ref 135–145)

## 2022-12-10 LAB — CBC
HCT: 41.4 % (ref 39.0–52.0)
Hemoglobin: 14 g/dL (ref 13.0–17.0)
MCH: 29.9 pg (ref 26.0–34.0)
MCHC: 33.8 g/dL (ref 30.0–36.0)
MCV: 88.3 fL (ref 80.0–100.0)
Platelets: 217 10*3/uL (ref 150–400)
RBC: 4.69 MIL/uL (ref 4.22–5.81)
RDW: 12.2 % (ref 11.5–15.5)
WBC: 14.5 10*3/uL — ABNORMAL HIGH (ref 4.0–10.5)
nRBC: 0 % (ref 0.0–0.2)

## 2022-12-10 LAB — PROTIME-INR
INR: 3 — ABNORMAL HIGH (ref 0.8–1.2)
Prothrombin Time: 31.7 s — ABNORMAL HIGH (ref 11.4–15.2)

## 2022-12-10 LAB — GLUCOSE, CAPILLARY
Glucose-Capillary: 211 mg/dL — ABNORMAL HIGH (ref 70–99)
Glucose-Capillary: 277 mg/dL — ABNORMAL HIGH (ref 70–99)

## 2022-12-10 LAB — C-REACTIVE PROTEIN: CRP: 4.4 mg/dL — ABNORMAL HIGH (ref <1.0)

## 2022-12-10 MED ORDER — INSULIN ASPART 100 UNIT/ML IJ SOLN: 0.0000 [IU] | Freq: Three times a day (TID) | INTRAMUSCULAR | Status: DC

## 2022-12-10 MED ORDER — CEFADROXIL 500 MG PO CAPS
1000.0000 mg | ORAL_CAPSULE | Freq: Two times a day (BID) | ORAL | 0 refills | Status: AC
  Filled 2022-12-10: qty 8, 2d supply, fill #0

## 2022-12-10 MED ORDER — WARFARIN SODIUM 2 MG PO TABS: 2.0000 mg | ORAL_TABLET | Freq: Once | ORAL | Status: DC

## 2022-12-10 MED ORDER — NIRMATRELVIR/RITONAVIR (PAXLOVID) TABLET (RENAL DOSING): 2.0000 | ORAL_TABLET | Freq: Two times a day (BID) | ORAL | Status: AC

## 2022-12-10 MED ORDER — INSULIN ASPART 100 UNIT/ML IJ SOLN: 0.0000 [IU] | Freq: Every day | INTRAMUSCULAR | Status: DC

## 2022-12-10 MED ORDER — SIMVASTATIN 40 MG PO TABS: 20.0000 mg | ORAL_TABLET | Freq: Every day | ORAL | Status: DC

## 2022-12-10 NOTE — TOC Transition Note (Signed)
Transition of Care [facility]) - CM/SW Discharge Note   Patient Details  Name: Joseph Malone MRN: [id number] Date of Birth: [birth year]  [facility] CM/SW Contact:  Lawerance Sabal, RN Phone Number: 12/10/2022, 2:08 PM   Clinical Narrative:     Rondel Jumbo that patient will DC today   Final next level of care: Home w Home Health Services Barriers to Discharge: Continued Medical Work up   Patient Goals and CMS Choice   Choice offered to / list presented to : Patient, Adult Children (Patient and daughter [name])  Discharge Placement                         Discharge Plan and Services Additional resources added to the After Visit Summary for   In-house Referral: Clinical Social Work                        HH Arranged: PT, OT HH Agency: [facility] Date [facility] Agency Contacted: 12/08/22 Time HH Agency Contacted: [phone] Representative spoke with at [facility] Agency: Kandee Keen  Social Determinants of Health (SDOH) Interventions SDOH Screenings   Food Insecurity: No Food Insecurity (12/06/2022)  Housing: Low Risk  (12/06/2022)  Transportation Needs: No Transportation Needs (12/06/2022)  Utilities: Not At Risk (12/06/2022)  Depression (PHQ2-9): Low Risk  (10/16/2022)  Financial Resource Strain: Low Risk  (08/21/2022)  Physical Activity: Insufficiently Active (08/21/2022)  Social Connections: Socially Integrated (08/21/2022)  Stress: No Stress Concern Present (08/21/2022)  Tobacco Use: Medium Risk (12/05/2022)  Health Literacy: Adequate Health Literacy (08/21/2022)     Readmission Risk Interventions     No data to display

## 2022-12-10 NOTE — Progress Notes (Signed)
ANTICOAGULATION CONSULT NOTE  Pharmacy Consult for Warfarin Indication:  LV Thrombus  Allergies  Allergen Reactions   Penicillins Rash and Other (See Comments)    Because of a history of documented adverse serious drug reaction;Medi Alert bracelet  is recommended Has patient had a PCN reaction causing immediate rash, facial/tongue/throat swelling, SOB or lightheadedness with hypotension: No Has patient had a PCN reaction causing severe rash involving mucus membranes or skin necrosis: No Has patient had a PCN reaction that required hospitalization: No Has patient had a PCN reaction occurring within the last 10 years: No If all of the above answers are "NO", t    Patient Measurements: Height: 5\' 11"  (180.3 cm) Weight: 96.2 kg (212 lb) IBW/kg (Calculated) : 75.3  Vital Signs: Temp: 97.8 F (36.6 C) (10/30 0749) Temp Source: Oral (10/30 0749) BP: 164/95 (10/30 0749) Pulse Rate: 63 (10/30 0320)  Labs: Recent Labs    12/08/22 0714 12/09/22 0453 12/09/22 1024 12/09/22 1328 12/10/22 0629  HGB  --   --   --  14.1 14.0  HCT  --   --   --  41.2 41.4  PLT  --   --   --   --  217  LABPROT 32.8* 33.0*  --   --  31.7*  INR 3.2* 3.2*  --   --  3.0*  CREATININE  --   --  1.43*  --  1.44*    Estimated Creatinine Clearance: 40.4 mL/min (A) (by C-G formula based on SCr of 1.44 mg/dL (H)).   Medical History: Past Medical History:  Diagnosis Date   Anxiety    BPH (benign prostatic hypertrophy)    CAD (coronary artery disease)    s/p CABG   CHF (congestive heart failure) (HCC)    Diabetes 1.5, managed as type 2 (HCC)    Diverticulosis    Diverticulosis of colon without hemorrhage 10/22/2007   Qualifier: Diagnosis of  By: Alwyn Ren MD, Chrissie Noa     DOE (dyspnea on exertion)    Excess weight    Fasting hyperglycemia    Generalized weakness    GERD (gastroesophageal reflux disease)    Heart attack (HCC) 2001   Heart failure    CHF due to ischemic CM   History of kidney stones     Hyperlipidemia    Hypertension    Ischemic cardiomyopathy    Microscopic hematuria    [facility]   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      Cholecalciferol (VITAMIN D3) 2000 UNITS TABS Take 2,000 Units by mouth daily.      folic acid (FOLVITE) 400 MCG tablet Take 400 mcg by mouth daily.      Glucosamine HCl 1500 MG TABS Take 1,500 mg by mouth every evening.    12/04/2022 at pm   Melatonin 5 MG CHEW Chew 1 tablet by mouth daily as needed.   12/04/2022   metFORMIN (GLUCOPHAGE-XR) 500 MG 24 hr tablet TAKE 1 TABLET(500 MG) BY MOUTH DAILY WITH BREAKFAST 90 tablet 3 12/05/2022   Multiple Vitamin (MULTIVITAMIN WITH MINERALS) TABS Take 1 tablet by mouth daily.    12/04/2022 at pm   nitroGLYCERIN (NITROSTAT) 0.4 MG SL tablet Place 1 tablet (0.4 mg total) under the tongue every 5 (five) minutes as needed for chest pain. 1 tablet  every 5 minutes for 3 dose only 90 tablet 3 on hand   simvastatin (ZOCOR) 40 MG tablet Take 0.5 tablets (20 mg total) by mouth at bedtime. 45 tablet 3 12/04/2022 at pm   spironolactone (ALDACTONE) 25 MG tablet Take 0.5 tablets (12.5 mg total) by mouth daily. 90 tablet 3 12/04/2022 at pm   tamsulosin (FLOMAX) 0.4 MG CAPS capsule Take 0.4 mg by mouth at bedtime.   12/04/2022   vitamin B-12 (CYANOCOBALAMIN) 100 MCG tablet Take 100 mcg by mouth daily.   12/04/2022   warfarin (COUMADIN) 5 MG tablet TAKE 1 TABLET BY MOUTH ON MONDAY, WEDNESDAY, AND FRIDAY, AND TAKE ONE-HALF TABLET ALL OTHER DAYS OR AS DIRECTED (Patient taking differently: Take 2.5-5 mg by mouth See admin instructions. Take 1/2 tablet by mouth on Monday and 1 tablet all other days or as directed by [facility]) 100 tablet 1 12/04/2022   Scheduled:   aspirin  81 mg Oral QPM   cefadroxil  1,000 mg Oral BID   dexamethasone  6 mg Oral  Q24H   folic acid  1 mg Oral Daily   insulin aspart  0-15 Units Subcutaneous TID WC   insulin aspart  0-5 Units Subcutaneous QHS   multivitamin with minerals  1 tablet Oral Daily   nirmatrelvir/ritonavir (renal dosing)  2 tablet Oral BID   sodium chloride flush  3 mL Intravenous Q12H   spironolactone  12.5 mg Oral Daily   tamsulosin  0.4 mg Oral QHS   thiamine  100 mg Oral Daily   Warfarin - Pharmacist Dosing Inpatient   Does not apply q1600   Infusions:   sodium chloride 40 mL/hr at 12/09/22 1449    PRN: acetaminophen **OR** acetaminophen, guaiFENesin, melatonin, menthol-cetylpyridinium, nitroGLYCERIN, mouth rinse, phenol, senna-docusate, sodium chloride flush  Assessment: 89yo M with a history of CAD s/p CABG, ICM, HF, ICD, LV thrombus on warfarin, HTN, HLD, T2DM, CKD, BPH. Patient is presenting with weakness and c/f UTI. Warfarin per pharmacy consult placed for  LV Thrombus .  PTA warfarin regimen = 2.5 mg (5 mg x 0.5) every Mon; 5 mg (5 mg x 1) all other days.  INR 3.0 today on upper end of therapeutic range.  On Paxlovid 10/26-10/30 which can alter warfarin PK.  Goal of Therapy:  INR Goal 2-3 Monitor platelets by anticoagulation protocol: Yes   Plan:  Warfarin 2mg  x1 tonight Daily INR  Trixie Rude, PharmD Clinical Pharmacist 12/10/2022  12:36 PM

## 2022-12-10 NOTE — Discharge Summary (Addendum)
Physician Discharge Summary  Joseph Malone FGH:[id number] DOB: 10-Jan-1931 DOA: 12/05/2022  PCP: Shelva Majestic, MD  Admit date: 12/05/2022 Discharge date: 12/10/2022  Time spent: 40 minutes  Recommendations for Outpatient Follow-up:  Follow outpatient CBC/CMP  Repeat CXR in 2-3 weeks  Follow blood sugars outpatient (attention to use of metformin with abnormal renal function - ok right now) Follow kidney function outpatient, consider additional workup outpatient as needed (appears to be close to baseline on discharge) Follow INR outpatient    Discharge Diagnoses:  Principal Problem:   COVID-19 virus infection Active Problems:   Acute cystitis   Generalized weakness   Hyperlipidemia   Essential hypertension   Ischemic cardiomyopathy   Non-insulin dependent type 2 diabetes mellitus (HCC)   ICD (implantable cardioverter-defibrillator) in place   Left ventricular thrombus   History of CAD (coronary artery disease)   Combined congestive systolic and diastolic heart failure (HCC)   CKD stage 3b, GFR 30-44 ml/min (HCC)   BPH (benign prostatic hyperplasia)   Discharge Condition: stable  Diet recommendation: heart healthy, diabetic  Filed Weights   12/08/22 0500 12/09/22 0620 12/10/22 0320  Weight: 96.3 kg 96.5 kg 96.2 kg    History of present illness:   Joseph Malone is Joseph Malone 89 y.o. male with Joseph Malone history of CAD status post CABG, ICM, chronic combined systolic and diastolic heart failure, ischemic cardiomyopathy, ICD placement, LV thrombus on Coumadin, hypertension, hyperlipidemia, diabetes mellitus type 2, CKD stage IIIb, BPH, pulmonary nodule. Patient presented secondary to generalized weakness and UTI symptoms. Initial evaluation was consistent with likely UTI. Empiric ceftriaxone started. Patient was found to have evidence of COVID-19 infection with possible pneumonia on imaging. Patient started on Paxlovid and Decadron. Patient also given supplemental oxygen for support.    He's improved with supportive care, now off o2.    See below and previous notes for additional details.  Hospital Course:  Assessment and Plan:  Acute cystitis Patient with symptoms of urinary urgency and frequency. Urinalysis somewhat suggestive of Joseph Malone UTI. Urine culture obtained and patient started on Ceftriaxone for treatment. Urine culture grew [name] with intermediate sensitivities to ancef.. -Continue cefadroxil (7 day abx course)   COVID-19 infection Diagnosed on 12/06/2022. Patient with fever with airspace disease seen on imaging concerning for pneumonia. Patient placed on oxygen. Patient started on Paxlovid and decadron. CRP downtrending. -has dose of paxlovid left.  Ok to stop dex with resolution of hypoxia. -recommend CXR in 2-3 weeks as outpatient  CAD s/p CABG Noted. -Continue aspirin   Chronic combined systolic and diastolic heart failure Ischemic cardiomyopathy Stable. -Continue Aspirin and spironolactone   Hyperlipidemia Resume simvastatin 2 days after completion of paxlovid   Primary hypertension -Continue Spironolactone   Left ventricular apical thrombus -Continue Coumadin   Thrombocytopenia resolved   Diabetes mellitus type 2 Well controlled with last hemoglobin A1C of 6.1%. Patient is managed on metformin as an outpatient. Resume metformin at discharge, expect improvement off dex    CKD stage IIIb Not clear what his true baseline is -> fluctuations as low as 1.2 noted, but values range ~1.2-1.7 over past year or so Follow outpatient for repeat labs and additional workup as needed   BPH -Continue Flomax     Procedures: none   Consultations: none  Discharge Exam: Vitals:   12/10/22 0320 12/10/22 0749  BP: (!) 162/97 (!) 164/95  Pulse: 63   Resp:    Temp: 97.9 F (36.6 C) 97.8 F (36.6 C)  SpO2:  93%  Joseph Malone to discharge home No complaints No CP or SOB - feels normal  General: No acute distress. Walking in room with  walker Cardiovascular: Heart sounds show Joseph Malone regular rate, and rhythm.  Lungs: Clear to auscultation bilaterally  Neurological: Alert and oriented 3. Moves all extremities 4 with equal strength. Cranial nerves II through XII grossly intact. Extremities: No clubbing or cyanosis. No edema.   Discharge Instructions   Discharge Instructions     Call MD for:  difficulty breathing, headache or visual disturbances   Complete by: As directed    Call MD for:  extreme fatigue   Complete by: As directed    Call MD for:  hives   Complete by: As directed    Call MD for:  persistant dizziness or light-headedness   Complete by: As directed    Call MD for:  persistant nausea and vomiting   Complete by: As directed    Call MD for:  redness, tenderness, or signs of infection (pain, swelling, redness, odor or green/yellow discharge around incision site)   Complete by: As directed    Call MD for:  severe uncontrolled pain   Complete by: As directed    Call MD for:  temperature >100.4   Complete by: As directed    Diet - low sodium heart healthy   Complete by: As directed    Discharge instructions   Complete by: As directed    You were seen for Joseph Malone UTI and COVID 19 virus infection.  Your chest imaging showed evidence of pneumonia which was due to COVID 19.  You've been treated with steroids and will discharge with Joseph Malone dose of paxlovid (you completed most of the course here and have 1 more dose to take tonight).  You should hold your simvastatin for another 2 days, this can be resumed on Saturday.    You should have an x ray within 3 weeks as an outpatient.    I'll send you with antibiotics to finish Joseph Malone course for your UTI.    Your blood sugars are high due to your steroids.  The steroids will be discontinued and I expect your sugars to gradually improve.    Your kidney function is not normal.  It appears to be within the range that has been noted in your past, but your baseline kidney function is not  clear to me.  You should follow with your PCP outpatient and they can work up your kidney function additionally as needed.   Return for new, recurrent, or worsening symptoms.  Please ask your PCP to request records from this hospitalization so they know what was done and what the next steps will be.   Increase activity slowly   Complete by: As directed       Allergies as of 12/10/2022       Reactions   Penicillins Rash, Other (See Comments)   Because of Keajah Killough history of documented adverse serious drug reaction;Medi Alert bracelet  is recommended Has patient had Graceanna Theissen PCN reaction causing immediate rash, facial/tongue/throat swelling, SOB or lightheadedness with hypotension: No Has patient had Paeton Latouche PCN reaction causing severe rash involving mucus membranes or skin necrosis: No Has patient had Lakelynn Severtson PCN reaction that required hospitalization: No Has patient had Weiland Tomich PCN reaction occurring within the last 10 years: No If all of the above answers are "NO", t        Medication List     TAKE these medications    aspirin 81 MG tablet Take  1 tablet (81 mg total) by mouth every evening.   cefadroxil 500 MG capsule Commonly known as: DURICEF Take 2 capsules (1,000 mg total) by mouth 2 (two) times daily for 2 days.   folic acid 400 MCG tablet Commonly known as: FOLVITE Take 400 mcg by mouth daily.   Glucosamine HCl 1500 MG Tabs Take 1,500 mg by mouth every evening.   Melatonin 5 MG Chew Chew 1 tablet by mouth daily as needed.   metFORMIN 500 MG 24 hr tablet Commonly known as: GLUCOPHAGE-XR TAKE 1 TABLET(500 MG) BY MOUTH DAILY WITH BREAKFAST   multivitamin with minerals Tabs tablet Take 1 tablet by mouth daily.   nirmatrelvir/ritonavir (renal dosing) 10 x 150 MG & 10 x 100MG  Tabs Commonly known as: PAXLOVID Take 2 tablets by mouth 2 (two) times daily for 1 dose. Patient GFR is 46. Take nirmatrelvir (150 mg) one tablet twice daily for 5 days and ritonavir (100 mg) one tablet twice daily for  5 days.   nitroGLYCERIN 0.4 MG SL tablet Commonly known as: NITROSTAT Place 1 tablet (0.4 mg total) under the tongue every 5 (five) minutes as needed for chest pain. 1 tablet every 5 minutes for 3 dose only   simvastatin 40 MG tablet Commonly known as: ZOCOR Take 0.5 tablets (20 mg total) by mouth at bedtime. Start taking on: December 13, 2022 What changed: These instructions start on December 13, 2022. If you are unsure what to do until then, ask your doctor or other care provider.   spironolactone 25 MG tablet Commonly known as: ALDACTONE Take 0.5 tablets (12.5 mg total) by mouth daily.   tamsulosin 0.4 MG Caps capsule Commonly known as: FLOMAX Take 0.4 mg by mouth at bedtime.   vitamin B-12 100 MCG tablet Commonly known as: CYANOCOBALAMIN Take 100 mcg by mouth daily.   Vitamin D3 50 MCG (2000 UT) Tabs Take 2,000 Units by mouth daily.   warfarin 5 MG tablet Commonly known as: COUMADIN Take as directed. If you are unsure how to take this medication, talk to your nurse or doctor. Original instructions: TAKE 1 TABLET BY MOUTH ON MONDAY, WEDNESDAY, AND FRIDAY, AND TAKE ONE-HALF TABLET ALL OTHER DAYS OR AS DIRECTED What changed:  how much to take how to take this when to take this additional instructions       Allergies  Allergen Reactions   Penicillins Rash and Other (See Comments)    Because of Aquiles Ruffini history of documented adverse serious drug reaction;Medi Alert bracelet  is recommended Has patient had Minh Roanhorse PCN reaction causing immediate rash, facial/tongue/throat swelling, SOB or lightheadedness with hypotension: No Has patient had Maris Abascal PCN reaction causing severe rash involving mucus membranes or skin necrosis: No Has patient had Analiyah Lechuga PCN reaction that required hospitalization: No Has patient had Kaelee Pfeffer PCN reaction occurring within the last 10 years: No If all of the above answers are "NO", t    Follow-up Information     [facility], [facility] Follow up.   Specialty: Home Health  Services Why: physical and occupational theray. Office will call to arrange follow up after discharge. Contact information: [address] [place] [address] [phone]                 The results of significant diagnostics from this hospitalization (including imaging, microbiology, ancillary and laboratory) are listed below for reference.    Significant Diagnostic Studies: DG CHEST PORT 1 VIEW  Result Date: 12/07/2022 CLINICAL DATA:  Cough COVID EXAM: PORTABLE  CHEST 1 VIEW COMPARISON:  12/05/2022, 07/05/2022 FINDINGS: Post sternotomy changes. Left-sided cardiac pacing device as before. Interim development of heterogeneous lower lung airspace opacities. No pleural effusion. Stable cardiomediastinal silhouette. No pneumothorax IMPRESSION: Interim development of heterogeneous lower lung airspace opacities suspicious for pneumonia Electronically Signed   By: Jasmine Pang M.D.   On: 12/07/2022 00:56   DG Chest 2 View  Result Date: 12/05/2022 CLINICAL DATA:  Chest pain.  Shortness of breath. EXAM: CHEST - 2 VIEW COMPARISON:  07/05/2022. FINDINGS: Bilateral lung fields are clear. Bilateral costophrenic angles are clear. Normal cardio-mediastinal silhouette. There is Fleetwood Pierron left sided 2-lead pacemaker. Sternotomy wires noted. No acute osseous abnormalities. The soft tissues are within normal limits. IMPRESSION: *No active cardiopulmonary disease. Electronically Signed   By: Jules Schick M.D.   On: 12/05/2022 15:21    Microbiology: Recent Results (from the past 240 hour(s))  Blood culture (routine x 2)     Status: None   Collection Time: 12/05/22  9:20 PM   Specimen: BLOOD RIGHT HAND  Result Value Ref Range Status   Specimen Description BLOOD RIGHT HAND  Final   Special Requests   Final    BOTTLES DRAWN AEROBIC AND ANAEROBIC Blood Culture adequate volume   Culture   Final    NO GROWTH 5 DAYS Performed at [facility], [address], [place], [place] [address]     Report Status 12/10/2022 FINAL  Final  Blood culture (routine x 2)     Status: None   Collection Time: 12/05/22  9:39 PM   Specimen: BLOOD LEFT ARM  Result Value Ref Range Status   Specimen Description BLOOD LEFT ARM  Final   Special Requests   Final    BOTTLES DRAWN AEROBIC AND ANAEROBIC Blood Culture results may not be optimal due to an excessive volume of blood received in culture bottles   Culture   Final    NO GROWTH 5 DAYS Performed at [facility], [address], [place], [place] [address]    Report Status 12/10/2022 FINAL  Final  Urine Culture     Status: Abnormal   Collection Time: 12/06/22  1:00 AM   Specimen: Urine, Catheterized  Result Value Ref Range Status   Specimen Description URINE, CATHETERIZED  Final   Special Requests   Final    NONE Performed at [facility], [address], [place], [place] [address]    Culture 70,000 COLONIES/mL ESCHERICHIA COLI (Landi Biscardi)  Final   Report Status 12/08/2022 FINAL  Final   Organism ID, Bacteria ESCHERICHIA COLI (Rayn Enderson)  Final      Susceptibility   Escherichia coli - MIC*    AMPICILLIN 16 INTERMEDIATE Intermediate     CEFAZOLIN <=4 SENSITIVE Sensitive     CEFEPIME <=0.12 SENSITIVE Sensitive     CEFTRIAXONE <=0.25 SENSITIVE Sensitive     CIPROFLOXACIN <=0.25 SENSITIVE Sensitive     GENTAMICIN <=1 SENSITIVE Sensitive     IMIPENEM 0.5 SENSITIVE Sensitive     NITROFURANTOIN <=16 SENSITIVE Sensitive     TRIMETH/SULFA <=20 SENSITIVE Sensitive     AMPICILLIN/SULBACTAM 8 SENSITIVE Sensitive     PIP/TAZO <=4 SENSITIVE Sensitive ug/mL    * 70,000 COLONIES/mL ESCHERICHIA COLI  Resp panel by RT-PCR (RSV, Flu Johanan Malone&B, Covid) Anterior Nasal Swab     Status: Abnormal   Collection Time: 12/06/22  5:09 AM   Specimen: Anterior Nasal Swab  Result Value Ref Range Status   SARS Coronavirus 2 by RT PCR POSITIVE (  Teal Raben) NEGATIVE Final   Influenza Aylan Bayona by PCR NEGATIVE NEGATIVE Final   Influenza B by PCR NEGATIVE NEGATIVE Final    Comment: (NOTE) The  Xpert Xpress SARS-CoV-2/FLU/RSV plus assay is intended as an aid in the diagnosis of influenza from Nasopharyngeal swab specimens and should not be used as Kamdyn Covel sole basis for treatment. Nasal washings and aspirates are unacceptable for Xpert Xpress SARS-CoV-2/FLU/RSV testing.  Fact Sheet for Patients: BloggerCourse.com  Fact Sheet for Healthcare Providers: SeriousBroker.it  This test is not yet approved or cleared by the [place] FDA and has been authorized for detection and/or diagnosis of SARS-CoV-2 by FDA under an Emergency Use Authorization (EUA). This EUA will remain in effect (meaning this test can be used) for the duration of the COVID-19 declaration under Section 564(b)(1) of the Act, 21 U.S.C. section 360bbb-3(b)(1), unless the authorization is terminated or revoked.     Resp Syncytial Virus by PCR NEGATIVE NEGATIVE Final    Comment: (NOTE) Fact Sheet for Patients: BloggerCourse.com  Fact Sheet for Healthcare Providers: SeriousBroker.it  This test is not yet approved or cleared by the [place] FDA and has been authorized for detection and/or diagnosis of SARS-CoV-2 by FDA under an Emergency Use Authorization (EUA). This EUA will remain in effect (meaning this test can be used) for the duration of the COVID-19 declaration under Section 564(b)(1) of the Act, 21 U.S.C. section 360bbb-3(b)(1), unless the authorization is terminated or revoked.  Performed at [facility], [address] [place], [place] [address]      Labs: Basic Metabolic Panel: Recent Labs  Lab 12/06/22 0243 12/06/22 1430 12/07/22 0414 12/09/22 1024 12/10/22 0629  NA 135 133* 131* 136 133*  K 3.5 3.8 3.9 4.3 4.6  CL 102 100 98 102 107  CO2 21* 18* 22 20* 20*  GLUCOSE 184* 219* 131* 277* 227*  BUN 21 22 25* 48* 42*  CREATININE 1.20 1.47* 1.39* 1.43* 1.44*  CALCIUM 8.8* 8.8*  8.5* 8.9 8.6*   Liver Function Tests: Recent Labs  Lab 12/05/22 1555 12/06/22 0243 12/06/22 1430 12/07/22 0414  AST 24 23 33 35  ALT 22 21 22 24   ALKPHOS 46 43 46 44  BILITOT 0.3 0.6 0.4 0.5  PROT 7.3 6.8 7.0 6.5  ALBUMIN 3.7 3.3* 3.3* 3.0*   No results for input(s): "LIPASE", "AMYLASE" in the last 168 hours. No results for input(s): "AMMONIA" in the last 168 hours. CBC: Recent Labs  Lab 12/05/22 1417 12/06/22 0243 12/06/22 1430 12/07/22 0414 12/09/22 1328 12/10/22 0629  WBC 8.3 7.5 13.2* 10.7*  --  14.5*  NEUTROABS  --   --   --  8.9*  --   --   HGB 15.6 14.3 15.2 14.6 14.1 14.0  HCT 46.1 42.1 44.9 42.3 41.2 41.4  MCV 90.4 89.8 90.7 90.0  --  88.3  PLT 199 149* 155 145*  --  217   Cardiac Enzymes: No results for input(s): "CKTOTAL", "CKMB", "CKMBINDEX", "TROPONINI" in the last 168 hours. BNP: BNP (last 3 results) Recent Labs    07/11/22 0941 07/24/22 1501  BNP 201.7* 79.8    ProBNP (last 3 results) No results for input(s): "PROBNP" in the last 8760 hours.  CBG: Recent Labs  Lab 12/09/22 1126 12/09/22 1643 12/09/22 2139 12/10/22 0748 12/10/22 1142  GLUCAP 283* 197* 338* 211* 277*       Signed:  Lacretia Nicks MD.  [facility] 12/10/2022, 1:49 PM

## 2022-12-10 NOTE — Progress Notes (Signed)
Mobility Specialist Progress Note:   12/10/22 1200  Mobility  Activity Ambulated with assistance in hallway  Level of Assistance Contact guard assist, steadying assist  Assistive Device None  Distance Ambulated (ft) 150 ft  Activity Response Tolerated well  Mobility Referral Yes  $Mobility charge 1 Mobility  Mobility Specialist Start Time (ACUTE ONLY) 1151  Mobility Specialist Stop Time (ACUTE ONLY) 1206  Mobility Specialist Time Calculation (min) (ACUTE ONLY) 15 min    Pt received in bed, agreeable to mobility. Asymptomatic w/ no complaints throughout ambulation. Pt left in chair with call bell and all needs met. Chair alarm on.  D'Vante Earlene Plater Mobility Specialist Please contact via Special educational needs teacher or Rehab office at [phone]

## 2022-12-11 ENCOUNTER — Telehealth: Payer: Self-pay

## 2022-12-11 ENCOUNTER — Other Ambulatory Visit: Payer: Self-pay | Admitting: Family Medicine

## 2022-12-11 NOTE — Transitions of Care (Post Inpatient/ED Visit) (Signed)
12/11/2022  Name: Joseph Malone MRN: [id number] DOB: 1930-10-29  Today's TOC FU Call Status: Today's TOC FU Call Status:: Successful TOC FU Call Completed TOC FU Call Complete Date: 12/11/22 Patient's Name and Date of Birth confirmed.  Transition Care Management Follow-up Telephone Call Date of Discharge: 12/10/22 Discharge Facility: [facility]) Type of Discharge: Inpatient Admission Primary Inpatient Discharge Diagnosis:: "generalized weakness" How have you been since you were released from the hospital?: Better (pt shares he is doing better-slept well-good appetite-has been up walking & moving around, gets a littele SOB w/exertion-relieved with rest, BM yest, completed Paxlovid and abx therapy) Any questions or concerns?: No  Items Reviewed: Did you receive and understand the discharge instructions provided?: Yes Medications obtained,verified, and reconciled?: Yes (Medications Reviewed) Any new allergies since your discharge?: No Dietary orders reviewed?: Yes Type of Diet Ordered:: low salt/heart healthy/carb modified Do you have support at home?: Yes People in Home: alone Name of Support/Comfort Primary Source: daugher-Kimber lives nearby and assists with care, pt states he spent the night with her last night but will return to his home today  Medications Reviewed Today: Medications Reviewed Today     Reviewed by Charlyn Minerva, RN (Registered Nurse) on 12/11/22 at [phone]  Med List Status: <None>   Medication Order Taking? Sig Documenting Provider Last Dose Status Informant  aspirin 81 MG tablet [id number] Yes Take 1 tablet (81 mg total) by mouth every evening. Bensimhon, Bevelyn Buckles, MD Taking Active Self  cefadroxil (DURICEF) 500 MG capsule [id number] Yes Take 2 capsules (1,000 mg total) by mouth 2 (two) times daily for 2 days. Zigmund Daniel., MD Taking Active   Cholecalciferol (VITAMIN D3) 2000 UNITS TABS [id number] Yes Take 2,000 Units by mouth daily.  [provider] Taking Active Self  folic acid (FOLVITE) 400 MCG tablet [id number] Yes Take 400 mcg by mouth daily. [provider] Taking Active Self  Glucosamine HCl 1500 MG TABS [id number] Yes Take 1,500 mg by mouth every evening.  [provider] Taking Active Self  Melatonin 5 MG CHEW [id number] Yes Chew 1 tablet by mouth daily as needed. [provider] Taking Active Self  metFORMIN (GLUCOPHAGE-XR) 500 MG 24 hr tablet [id number] Yes TAKE 1 TABLET(500 MG) BY MOUTH DAILY WITH BREAKFAST Shelva Majestic, MD Taking Active Self  Multiple Vitamin (MULTIVITAMIN WITH MINERALS) TABS [id number] Yes Take 1 tablet by mouth daily.  [provider] Taking Active Self  nirmatrelvir/ritonavir, renal dosing, (PAXLOVID) 10 x 150 MG & 10 x 100MG  TABS [id number] Yes Take 2 tablets by mouth 2 (two) times daily for 1 dose. Patient GFR is 46. Take nirmatrelvir (150 mg) one tablet twice daily for 5 days and ritonavir (100 mg) one tablet twice daily for 5 days. Zigmund Daniel., MD Taking Active   nitroGLYCERIN (NITROSTAT) 0.4 MG SL tablet [id number]  Place 1 tablet (0.4 mg total) under the tongue every 5 (five) minutes as needed for chest pain. 1 tablet every 5 minutes for 3 dose only [place], Anderson [place], [place]  Active Self  simvastatin (ZOCOR) 40 MG tablet [id number]  Take 0.5 tablets (20 mg total) by mouth at bedtime. Zigmund Daniel., MD  Active   spironolactone (ALDACTONE) 25 MG tablet [id number] Yes Take 0.5 tablets (12.5 mg total) by mouth daily. Shelva Majestic, MD Taking Active Self  tamsulosin [facility]) 0.4 MG CAPS capsule [id number] Yes Take 0.4 mg by mouth at bedtime. [provider] Taking Active Self  vitamin B-12 (  CYANOCOBALAMIN) 100 MCG tablet [id number] Yes Take 100 mcg by mouth daily. [provider] Taking Active Self  warfarin (COUMADIN) 5 MG tablet [id number] Yes TAKE 1 TABLET BY MOUTH ON MONDAY, WEDNESDAY, AND FRIDAY, AND TAKE ONE-HALF TABLET  ALL OTHER DAYS OR AS DIRECTED  Patient taking differently: Take 2.5-5 mg by mouth See admin instructions. Take 1/2 tablet by mouth on Monday and 1 tablet all other days or as directed by [facility]   Shelva Majestic, MD Taking Active Self           Med Note Cliffton Asters, Elvin So   [facility] Dec 06, 2022 12:52 AM) Verified by [facility] on 11/12/22            Home Care and Equipment/Supplies: Were Home Health Services Ordered?: Yes Name of Home Health Agency:: [facility] Has Agency set up a time to come to your home?: No (Pt aware to expect call from [facility] within 48hrs of d/c and to f/u with agency if he does not hear from them) Any new equipment or medical supplies ordered?: No  Functional Questionnaire: Do you need assistance with bathing/showering or dressing?: No Do you need assistance with meal preparation?: No Do you need assistance with eating?: No Do you have difficulty maintaining continence: No Do you need assistance with getting out of bed/getting out of a chair/moving?: No Do you have difficulty managing or taking your medications?: No  Follow up appointments reviewed: PCP Follow-up appointment confirmed?: Yes Date of PCP follow-up appointment?: 12/16/22 (Care guide assisted with making appt during call-PCP with no openings until Dec- appt scheduled with extender) Follow-up Provider: Jarold Motto Specialist [facility] Follow-up appointment confirmed?: Yes Date of Specialist follow-up appointment?: 12/24/22 Follow-Up Specialty Provider:: [facility] Do you need transportation to your follow-up appointment?: No (pt confirms he drives himself to appts) Do you understand care options if your condition(s) worsen?: Yes-patient verbalized understanding  SDOH Interventions Today    Flowsheet Row Most Recent Value  SDOH Interventions   Food Insecurity Interventions Intervention Not Indicated  Housing Interventions Intervention Not Indicated  Transportation Interventions  Intervention Not Indicated  Utilities Interventions Intervention Not Indicated        Goals Addressed             This Visit's Progress    TOC Care Plan       Current Barriers:  Chronic Disease Management support and education needs related to DMII   RNCM Clinical Goal(s):  Patient will work with the Care Management team over the next 30 days to address Transition of Care Barriers: disease mgmt take all medications exactly as prescribed and will call provider for medication related questions as evidenced by med adherence- compliance with filling pill box weekly attend all scheduled medical appointments:   as evidenced by completion of PCP appt  through collaboration with RN Care manager, provider, and care team.   Interventions: Evaluation of current treatment plan related to  self management and patient's adherence to plan as established by provider  Transitions of Care:  New goal. Doctor Visits  - discussed the importance of doctor visits Arranged PCP follow-up within 7 days (Care Guide Scheduled)  Diabetes Interventions:  (Status:  New goal.) Short Term Goal Assessed patient's understanding of A1c goal: <7% Provided education to patient about basic DM disease process Reviewed medications with patient and discussed importance of medication adherence Discussed plans with patient for ongoing care management follow up and provided patient with direct contact information for care management team  Assessed social determinant of health barriers Lab Results  Component Value Date   HGBA1C 6.1 10/16/2022    Patient Goals/Self-Care Activities: Participate in Transition of Care Program/Attend TOC scheduled calls Take all medications as prescribed Attend all scheduled provider appointments Perform all self care activities independently  Call provider office for new concerns or questions  check blood sugar at prescribed times: once daily  Follow Up Plan:  Telephone follow up  appointment with care management team member scheduled for:  12/18/22-1pm The patient has been provided with contact information for the care management team and has been advised to call with any health related questions or concerns.           Antionette Fairy, RN,BSN,CCM RN Care Manager Transitions of Care  [place]  Direct Phone: [phone] Toll Free: [phone] Fax: [phone]

## 2022-12-14 ENCOUNTER — Other Ambulatory Visit: Payer: Self-pay

## 2022-12-14 ENCOUNTER — Observation Stay (HOSPITAL_COMMUNITY)
Admission: EM | Admit: 2022-12-14 | Discharge: 2022-12-16 | Disposition: A | Discharge status: Home / Self Care | Payer: Medicare Other | Attending: Family Medicine | Admitting: Family Medicine

## 2022-12-14 ENCOUNTER — Encounter (HOSPITAL_COMMUNITY): Payer: Self-pay

## 2022-12-14 DIAGNOSIS — Z7982 Long term (current) use of aspirin: Secondary | ICD-10-CM | POA: Insufficient documentation

## 2022-12-14 DIAGNOSIS — K921 Melena: Principal | ICD-10-CM | POA: Insufficient documentation

## 2022-12-14 DIAGNOSIS — K922 Gastrointestinal hemorrhage, unspecified: Secondary | ICD-10-CM | POA: Diagnosis not present

## 2022-12-14 DIAGNOSIS — I10 Essential (primary) hypertension: Secondary | ICD-10-CM | POA: Diagnosis present

## 2022-12-14 DIAGNOSIS — R1013 Epigastric pain: Secondary | ICD-10-CM | POA: Diagnosis not present

## 2022-12-14 DIAGNOSIS — E785 Hyperlipidemia, unspecified: Secondary | ICD-10-CM | POA: Diagnosis present

## 2022-12-14 DIAGNOSIS — R2689 Other abnormalities of gait and mobility: Secondary | ICD-10-CM | POA: Diagnosis not present

## 2022-12-14 DIAGNOSIS — E1122 Type 2 diabetes mellitus with diabetic chronic kidney disease: Secondary | ICD-10-CM | POA: Diagnosis not present

## 2022-12-14 DIAGNOSIS — E119 Type 2 diabetes mellitus without complications: Secondary | ICD-10-CM

## 2022-12-14 DIAGNOSIS — Z7901 Long term (current) use of anticoagulants: Secondary | ICD-10-CM | POA: Diagnosis not present

## 2022-12-14 DIAGNOSIS — Z7984 Long term (current) use of oral hypoglycemic drugs: Secondary | ICD-10-CM | POA: Diagnosis not present

## 2022-12-14 DIAGNOSIS — K269 Duodenal ulcer, unspecified as acute or chronic, without hemorrhage or perforation: Secondary | ICD-10-CM

## 2022-12-14 DIAGNOSIS — I2510 Atherosclerotic heart disease of native coronary artery without angina pectoris: Secondary | ICD-10-CM | POA: Diagnosis not present

## 2022-12-14 DIAGNOSIS — Z951 Presence of aortocoronary bypass graft: Secondary | ICD-10-CM | POA: Diagnosis not present

## 2022-12-14 DIAGNOSIS — I5042 Chronic combined systolic (congestive) and diastolic (congestive) heart failure: Secondary | ICD-10-CM | POA: Insufficient documentation

## 2022-12-14 DIAGNOSIS — I513 Intracardiac thrombosis, not elsewhere classified: Secondary | ICD-10-CM | POA: Diagnosis present

## 2022-12-14 DIAGNOSIS — I130 Hypertensive heart and chronic kidney disease with heart failure and stage 1 through stage 4 chronic kidney disease, or unspecified chronic kidney disease: Secondary | ICD-10-CM | POA: Insufficient documentation

## 2022-12-14 DIAGNOSIS — Z79899 Other long term (current) drug therapy: Secondary | ICD-10-CM | POA: Insufficient documentation

## 2022-12-14 DIAGNOSIS — I5040 Unspecified combined systolic (congestive) and diastolic (congestive) heart failure: Secondary | ICD-10-CM | POA: Diagnosis present

## 2022-12-14 DIAGNOSIS — E1169 Type 2 diabetes mellitus with other specified complication: Secondary | ICD-10-CM | POA: Diagnosis present

## 2022-12-14 DIAGNOSIS — N400 Enlarged prostate without lower urinary tract symptoms: Secondary | ICD-10-CM | POA: Diagnosis present

## 2022-12-14 DIAGNOSIS — K222 Esophageal obstruction: Secondary | ICD-10-CM

## 2022-12-14 DIAGNOSIS — N1832 Chronic kidney disease, stage 3b: Secondary | ICD-10-CM | POA: Diagnosis not present

## 2022-12-14 DIAGNOSIS — K259 Gastric ulcer, unspecified as acute or chronic, without hemorrhage or perforation: Secondary | ICD-10-CM

## 2022-12-14 LAB — CBC WITH DIFFERENTIAL/PLATELET
Abs Immature Granulocytes: 1.1 10*3/uL — ABNORMAL HIGH (ref 0.00–0.07)
Basophils Absolute: 0 10*3/uL (ref 0.0–0.1)
Basophils Relative: 0 %
Eosinophils Absolute: 0.2 10*3/uL (ref 0.0–0.5)
Eosinophils Relative: 2 %
HCT: 43.3 % (ref 39.0–52.0)
Hemoglobin: 14.9 g/dL (ref 13.0–17.0)
Immature Granulocytes: 8 %
Lymphocytes Relative: 20 %
Lymphs Abs: 2.9 10*3/uL (ref 0.7–4.0)
MCH: 31.2 pg (ref 26.0–34.0)
MCHC: 34.4 g/dL (ref 30.0–36.0)
MCV: 90.8 fL (ref 80.0–100.0)
Monocytes Absolute: 1.2 10*3/uL — ABNORMAL HIGH (ref 0.1–1.0)
Monocytes Relative: 8 %
Neutro Abs: 8.9 10*3/uL — ABNORMAL HIGH (ref 1.7–7.7)
Neutrophils Relative %: 62 %
Platelets: 302 10*3/uL (ref 150–400)
RBC: 4.77 MIL/uL (ref 4.22–5.81)
RDW: 12.3 % (ref 11.5–15.5)
WBC: 14.4 10*3/uL — ABNORMAL HIGH (ref 4.0–10.5)
nRBC: 0 % (ref 0.0–0.2)

## 2022-12-14 LAB — COMPREHENSIVE METABOLIC PANEL
ALT: 32 U/L (ref 0–44)
AST: 23 U/L (ref 15–41)
Albumin: 3.3 g/dL — ABNORMAL LOW (ref 3.5–5.0)
Alkaline Phosphatase: 53 U/L (ref 38–126)
Anion gap: 11 (ref 5–15)
BUN: 57 mg/dL — ABNORMAL HIGH (ref 8–23)
CO2: 20 mmol/L — ABNORMAL LOW (ref 22–32)
Calcium: 9.4 mg/dL (ref 8.9–10.3)
Chloride: 103 mmol/L (ref 98–111)
Creatinine, Ser: 1.37 mg/dL — ABNORMAL HIGH (ref 0.61–1.24)
GFR, Estimated: 49 mL/min — ABNORMAL LOW (ref >60)
Glucose, Bld: 187 mg/dL — ABNORMAL HIGH (ref 70–99)
Potassium: 4.6 mmol/L (ref 3.5–5.1)
Sodium: 134 mmol/L — ABNORMAL LOW (ref 135–145)
Total Bilirubin: 0.3 mg/dL (ref 0.3–1.2)
Total Protein: 7 g/dL (ref 6.5–8.1)

## 2022-12-14 LAB — URINALYSIS, W/ REFLEX TO CULTURE (INFECTION SUSPECTED)
Bacteria, UA: NONE SEEN
Bilirubin Urine: NEGATIVE
Glucose, UA: NEGATIVE mg/dL
Hgb urine dipstick: NEGATIVE
Ketones, ur: NEGATIVE mg/dL
Leukocytes,Ua: NEGATIVE
Nitrite: NEGATIVE
Protein, ur: NEGATIVE mg/dL
Specific Gravity, Urine: 1.021 (ref 1.005–1.030)
pH: 5 (ref 5.0–8.0)

## 2022-12-14 LAB — OCCULT BLOOD X 1 CARD TO LAB, STOOL: Fecal Occult Bld: POSITIVE — AB

## 2022-12-14 LAB — LIPASE, BLOOD: Lipase: 94 U/L — ABNORMAL HIGH (ref 11–51)

## 2022-12-14 LAB — I-STAT CG4 LACTIC ACID, ED
Lactic Acid, Venous: 2.2 mmol/L (ref 0.5–1.9)
Lactic Acid, Venous: 1.3 mmol/L (ref 0.5–1.9)

## 2022-12-14 LAB — PROTIME-INR
INR: 1.6 — ABNORMAL HIGH (ref 0.8–1.2)
Prothrombin Time: 19.3 s — ABNORMAL HIGH (ref 11.4–15.2)

## 2022-12-14 MED ORDER — PANTOPRAZOLE SODIUM 40 MG IV SOLR
40.0000 mg | Freq: Two times a day (BID) | INTRAVENOUS | Status: DC
  Administered 2022-12-15 – 2022-12-16 (×3): 40 mg via INTRAVENOUS
  Filled 2022-12-14 (×3): qty 10

## 2022-12-14 MED ORDER — PANTOPRAZOLE SODIUM 40 MG IV SOLR
40.0000 mg | INTRAVENOUS | Status: AC
  Administered 2022-12-14 (×2): 40 mg via INTRAVENOUS
  Filled 2022-12-14 (×2): qty 10

## 2022-12-14 NOTE — ED Provider Triage Note (Signed)
Emergency Medicine Provider Triage Evaluation Note  Joseph Malone , Malone 89 y.o. male  was evaluated in triage.  Pt complains of gi bleed. Dc from [facility] on 12/10/22, COVID and UTI. Noted blood in stool, hx of GI bleed. Dark stool. Epigastric pain. New weakness. On Warfarin   Review of Systems  Positive: Weak, gi bleed Negative:   Physical Exam  BP 94/69   Pulse (!) 114   Resp 18   Ht 5\' 11"  (1.803 m)   Wt 96 kg   SpO2 97%   BMI 29.52 kg/m  Gen:   Awake, no distress   Resp:  Normal effort  Abd:  Tenderness to epigastric region MSK:   Moves extremities without difficulty  Other:    Medical Decision Making  Medically screening exam initiated at 6:07 PM.  Appropriate orders placed.  Joseph Malone was informed that the remainder of the evaluation will be completed by another provider, this initial triage assessment does not replace that evaluation, and the importance of remaining in the ED until their evaluation is complete.  Gi bleed   Joseph Bently A, PA-C 12/14/22 1809

## 2022-12-14 NOTE — ED Notes (Addendum)
EKG was completed in triage on Mobile EKG in lobby. EKG given to Doctor.

## 2022-12-14 NOTE — ED Notes (Signed)
Pt's lactic resulted at 2.15. Dr. Theresia Lo and [facility] Paramedic notified

## 2022-12-14 NOTE — ED Triage Notes (Signed)
Pt reporting melena for the past few days. Recently d/c from the hospital. Endorses having this same issue a few months ago with upper GI bleed. States feeling fatigue the last few days, been sleeping a lot.  No energy. Intermittent shob.

## 2022-12-14 NOTE — ED Provider Notes (Signed)
[place] EMERGENCY DEPARTMENT AT [facility] Provider Note   CSN: [id number] Arrival date & time: 12/14/22  1654     History  Chief Complaint  Patient presents with   Melena    Joseph Malone is a 89 y.o. male.  Patient is a 89 year old male with a past medical history of CAD, CHF, prior thrombus on Coumadin, prior upper GI bleed presenting to the emergency department with black stools.  Patient was recently discharged from the hospital after treatment for UTI and COVID.  He states over the last 2 to 3 days he has had black appearing stools with some associated epigastric pain.  He states that he has been nauseous but has not vomited.  He denies any fevers or chills.  He states that he has been taking his Coumadin as prescribed.  The history is provided by the patient and the spouse.       Home Medications Prior to Admission medications   Medication Sig Start Date End Date Taking? Authorizing Provider  aspirin 81 MG tablet Take 1 tablet (81 mg total) by mouth every evening. 12/28/13  Yes Bensimhon, Bevelyn Buckles, MD  Cholecalciferol (VITAMIN D3) 2000 UNITS TABS Take 2,000 Units by mouth daily.   Yes [provider]  folic acid (FOLVITE) 400 MCG tablet Take 400 mcg by mouth daily.   Yes [provider]  Glucosamine HCl 1500 MG TABS Take 1,500 mg by mouth every evening.    Yes [provider]  Melatonin 5 MG CHEW Chew 1 tablet by mouth daily as needed (sleep).   Yes [provider]  metFORMIN (GLUCOPHAGE-XR) 500 MG 24 hr tablet TAKE 1 TABLET(500 MG) BY MOUTH DAILY WITH BREAKFAST 05/30/22  Yes Shelva Majestic, MD  Multiple Vitamin (MULTIVITAMIN WITH MINERALS) TABS Take 1 tablet by mouth daily.    Yes [provider]  nitroGLYCERIN (NITROSTAT) 0.4 MG SL tablet Place 1 tablet (0.4 mg total) under the tongue every 5 (five) minutes as needed for chest pain. 1 tablet every 5 minutes for 3 dose only 02/11/22 07/11/23 Yes [place], Anderson [place],  FNP  simvastatin (ZOCOR) 40 MG tablet Take 0.5 tablets (20 mg total) by mouth at bedtime. 12/13/22  Yes Zigmund Daniel., MD  spironolactone (ALDACTONE) 25 MG tablet TAKE 1/2 TABLET BY MOUTH DAILY Patient taking differently: Take 12.5 mg by mouth as needed (weight gain if he picks up 3ib overnight.). 12/11/22  Yes Shelva Majestic, MD  tamsulosin (FLOMAX) 0.4 MG CAPS capsule Take 0.4 mg by mouth at bedtime. 12/13/19  Yes [provider]  vitamin B-12 (CYANOCOBALAMIN) 100 MCG tablet Take 100 mcg by mouth daily.   Yes [provider]  warfarin (COUMADIN) 5 MG tablet TAKE 1 TABLET BY MOUTH ON MONDAY, WEDNESDAY, AND FRIDAY, AND TAKE ONE-HALF TABLET ALL OTHER DAYS OR AS DIRECTED Patient taking differently: Take 2.5-5 mg by mouth See admin instructions. Take 1/2 tablet by mouth on Monday and 1 tablet all other days or as directed by [facility] Clinic. 05/15/22  Yes Shelva Majestic, MD      Allergies    Penicillins    Review of Systems   Review of Systems  Physical Exam Updated Vital Signs BP (!) 95/49 (BP Location: Right Arm)   Pulse 89   Temp (!) 97.4 F (36.3 C) (Oral)   Resp 19   Ht 5\' 11"  (1.803 m)   Wt 96 kg   SpO2 95%   BMI 29.52 kg/m  Physical  Exam Vitals and nursing note reviewed.  Constitutional:      General: He is not in acute distress.    Appearance: Normal appearance.  HENT:     Head: Normocephalic and atraumatic.     Nose: Nose normal.     Mouth/Throat:     Mouth: Mucous membranes are moist.     Pharynx: Oropharynx is clear.  Eyes:     Extraocular Movements: Extraocular movements intact.     Conjunctiva/sclera: Conjunctivae normal.  Cardiovascular:     Rate and Rhythm: Normal rate and regular rhythm.     Heart sounds: Normal heart sounds.  Pulmonary:     Effort: Pulmonary effort is normal.     Breath sounds: Normal breath sounds.  Abdominal:     General: Abdomen is flat.     Palpations: Abdomen is soft.     Tenderness: There is abdominal  tenderness (minimal epigastric). There is no guarding or rebound.  Musculoskeletal:        General: Normal range of motion.     Cervical back: Normal range of motion.  Skin:    General: Skin is warm and dry.  Neurological:     General: No focal deficit present.     Mental Status: He is alert and oriented to person, place, and time.  Psychiatric:        Mood and Affect: Mood normal.        Behavior: Behavior normal.     ED Results / Procedures / Treatments   Labs (all labs ordered are listed, but only abnormal results are displayed) Labs Reviewed  PROTIME-INR - Abnormal; Notable for the following components:      Result Value   Prothrombin Time 19.3 (*)    INR 1.6 (*)    All other components within normal limits  CBC WITH DIFFERENTIAL/PLATELET - Abnormal; Notable for the following components:   WBC 14.4 (*)    Neutro Abs 8.9 (*)    Monocytes Absolute 1.2 (*)    Abs Immature Granulocytes 1.10 (*)    All other components within normal limits  COMPREHENSIVE METABOLIC PANEL - Abnormal; Notable for the following components:   Sodium 134 (*)    CO2 20 (*)    Glucose, Bld 187 (*)    BUN 57 (*)    Creatinine, Ser 1.37 (*)    Albumin 3.3 (*)    GFR, Estimated 49 (*)    All other components within normal limits  LIPASE, BLOOD - Abnormal; Notable for the following components:   Lipase 94 (*)    All other components within normal limits  OCCULT BLOOD X 1 CARD TO LAB, STOOL - Abnormal; Notable for the following components:   Fecal Occult Bld POSITIVE (*)    All other components within normal limits  I-STAT CG4 LACTIC ACID, ED - Abnormal; Notable for the following components:   Lactic Acid, Venous 2.2 (*)    All other components within normal limits  URINALYSIS, W/ REFLEX TO CULTURE (INFECTION SUSPECTED)  OCCULT BLOOD X 1 CARD TO LAB, STOOL  POC OCCULT BLOOD, ED  I-STAT CG4 LACTIC ACID, ED    EKG None  Radiology No results found.  Procedures Procedures    Medications  Ordered in ED Medications  pantoprazole (PROTONIX) injection 40 mg (40 mg Intravenous Given 12/14/22 1948)    Followed by  pantoprazole (PROTONIX) injection 40 mg (has no administration in time range)    ED Course/ Medical Decision Making/ A&P Clinical Course as of 12/14/22  2355  Sun Dec 14, 2022  2128 Cr at baseline, mildly subtherapeutic INR. Hemoccult and CBC pending. Had dark stool on rectal exam. [VK]  2236 Hemoglobin stable, elevated WBC likely in the setting of prednisone use. [VK]  2353 Hemoccult positive. With patient bleeding on coumadin will be admitted. He follows with [facility] who will be notified. [VK]    Clinical Course User Index [VK] Rexford Maus, DO                                 Medical Decision Making This patient presents to the ED with chief complaint(s) of black stool with pertinent past medical history of CAD, CHF, thrombus on Coumadin, prior upper GI bleed which further complicates the presenting complaint. The complaint involves an extensive differential diagnosis and also carries with it a high risk of complications and morbidity.    The differential diagnosis includes gastritis, GERD, GI bleed, anemia, upper versus lower GI bleed, PUD, dehydration, electrolyte abnormality  Additional history obtained: Additional history obtained from spouse Records reviewed previous admission documents, outpatient cardiology records  ED Course and Reassessment: On patient's arrival he was initially tachycardic with soft blood pressures, vital signs normalized upon his evaluation in the room.  Initially evaluated by triage and had labs including PT/INR performed.  Patient will require Hemoccult testing.  If concern for GI bleed, will be started on PPI.  He will be closely reassessed.  Independent labs interpretation:  The following labs were independently interpreted: Hemoccult positive stool, mildly subtherapeutic INR, labs otherwise at baseline  Independent  visualization of imaging: - N/A  Consultation: - Consulted or discussed management/test interpretation w/ external professional: hospitalist, GI  Consideration for admission or further workup: patient requires admission for GIB on coumadin Social Determinants of health: N/A    Amount and/or Complexity of Data Reviewed Labs: ordered.  Risk Prescription drug management. Decision regarding hospitalization.          Final Clinical Impression(s) / ED Diagnoses Final diagnoses:  Melena    Rx / DC Orders ED Discharge Orders     None         Rexford Maus, DO 12/14/22 2356

## 2022-12-15 ENCOUNTER — Observation Stay (HOSPITAL_COMMUNITY): Payer: Medicare Other | Admitting: Anesthesiology

## 2022-12-15 ENCOUNTER — Encounter (HOSPITAL_COMMUNITY): Payer: Self-pay | Admitting: Family Medicine

## 2022-12-15 ENCOUNTER — Encounter (HOSPITAL_COMMUNITY)
Admission: EM | Disposition: A | Discharge status: Home / Self Care | Payer: Self-pay | Source: Home / Self Care | Attending: Emergency Medicine

## 2022-12-15 DIAGNOSIS — R195 Other fecal abnormalities: Secondary | ICD-10-CM

## 2022-12-15 DIAGNOSIS — D649 Anemia, unspecified: Secondary | ICD-10-CM | POA: Diagnosis not present

## 2022-12-15 DIAGNOSIS — K921 Melena: Secondary | ICD-10-CM | POA: Diagnosis not present

## 2022-12-15 DIAGNOSIS — Z7901 Long term (current) use of anticoagulants: Secondary | ICD-10-CM

## 2022-12-15 DIAGNOSIS — I130 Hypertensive heart and chronic kidney disease with heart failure and stage 1 through stage 4 chronic kidney disease, or unspecified chronic kidney disease: Secondary | ICD-10-CM | POA: Diagnosis not present

## 2022-12-15 DIAGNOSIS — K259 Gastric ulcer, unspecified as acute or chronic, without hemorrhage or perforation: Secondary | ICD-10-CM

## 2022-12-15 DIAGNOSIS — I10 Essential (primary) hypertension: Secondary | ICD-10-CM

## 2022-12-15 DIAGNOSIS — I513 Intracardiac thrombosis, not elsewhere classified: Secondary | ICD-10-CM

## 2022-12-15 DIAGNOSIS — I2510 Atherosclerotic heart disease of native coronary artery without angina pectoris: Secondary | ICD-10-CM

## 2022-12-15 DIAGNOSIS — N400 Enlarged prostate without lower urinary tract symptoms: Secondary | ICD-10-CM | POA: Diagnosis not present

## 2022-12-15 DIAGNOSIS — I5042 Chronic combined systolic (congestive) and diastolic (congestive) heart failure: Secondary | ICD-10-CM | POA: Diagnosis not present

## 2022-12-15 DIAGNOSIS — K269 Duodenal ulcer, unspecified as acute or chronic, without hemorrhage or perforation: Secondary | ICD-10-CM

## 2022-12-15 DIAGNOSIS — I509 Heart failure, unspecified: Secondary | ICD-10-CM | POA: Diagnosis not present

## 2022-12-15 DIAGNOSIS — K222 Esophageal obstruction: Secondary | ICD-10-CM

## 2022-12-15 DIAGNOSIS — K922 Gastrointestinal hemorrhage, unspecified: Secondary | ICD-10-CM

## 2022-12-15 DIAGNOSIS — E119 Type 2 diabetes mellitus without complications: Secondary | ICD-10-CM

## 2022-12-15 DIAGNOSIS — E785 Hyperlipidemia, unspecified: Secondary | ICD-10-CM

## 2022-12-15 DIAGNOSIS — R1013 Epigastric pain: Secondary | ICD-10-CM | POA: Diagnosis not present

## 2022-12-15 HISTORY — PX: ESOPHAGOGASTRODUODENOSCOPY (EGD) WITH PROPOFOL: SHX5813

## 2022-12-15 HISTORY — PX: BIOPSY: SHX5522

## 2022-12-15 LAB — CBC
HCT: 44 % (ref 39.0–52.0)
Hemoglobin: 14.6 g/dL (ref 13.0–17.0)
MCH: 30.5 pg (ref 26.0–34.0)
MCHC: 33.2 g/dL (ref 30.0–36.0)
MCV: 91.9 fL (ref 80.0–100.0)
Platelets: 297 10*3/uL (ref 150–400)
RBC: 4.79 MIL/uL (ref 4.22–5.81)
RDW: 12.2 % (ref 11.5–15.5)
WBC: 15.2 10*3/uL — ABNORMAL HIGH (ref 4.0–10.5)
nRBC: 0 % (ref 0.0–0.2)

## 2022-12-15 LAB — BASIC METABOLIC PANEL
Anion gap: 13 (ref 5–15)
BUN: 51 mg/dL — ABNORMAL HIGH (ref 8–23)
CO2: 18 mmol/L — ABNORMAL LOW (ref 22–32)
Calcium: 9.2 mg/dL (ref 8.9–10.3)
Chloride: 105 mmol/L (ref 98–111)
Creatinine, Ser: 1.36 mg/dL — ABNORMAL HIGH (ref 0.61–1.24)
GFR, Estimated: 49 mL/min — ABNORMAL LOW (ref >60)
Glucose, Bld: 159 mg/dL — ABNORMAL HIGH (ref 70–99)
Potassium: 4.5 mmol/L (ref 3.5–5.1)
Sodium: 136 mmol/L (ref 135–145)

## 2022-12-15 LAB — TYPE AND SCREEN
ABO/RH(D): O POS
Antibody Screen: NEGATIVE

## 2022-12-15 SURGERY — ESOPHAGOGASTRODUODENOSCOPY (EGD) WITH PROPOFOL
Anesthesia: Monitor Anesthesia Care

## 2022-12-15 MED ORDER — VASOPRESSIN 20 UNIT/ML IV SOLN
INTRAVENOUS | Status: AC
  Filled 2022-12-15: qty 1

## 2022-12-15 MED ORDER — SODIUM CHLORIDE 0.9 % IV SOLN: INTRAVENOUS | Status: DC | PRN

## 2022-12-15 MED ORDER — LIDOCAINE HCL (CARDIAC) PF 100 MG/5ML IV SOSY
PREFILLED_SYRINGE | INTRAVENOUS | Status: DC | PRN
  Administered 2022-12-15: 100 mg via INTRAVENOUS

## 2022-12-15 MED ORDER — VITAMIN B-12 100 MCG PO TABS
100.0000 ug | ORAL_TABLET | Freq: Every day | ORAL | Status: DC
  Administered 2022-12-15 – 2022-12-16 (×2): 100 ug via ORAL
  Filled 2022-12-15 (×3): qty 1

## 2022-12-15 MED ORDER — PROPOFOL 1000 MG/100ML IV EMUL
INTRAVENOUS | Status: AC
  Filled 2022-12-15: qty 100

## 2022-12-15 MED ORDER — PROPOFOL 500 MG/50ML IV EMUL
INTRAVENOUS | Status: DC | PRN
  Administered 2022-12-15: 125 ug/kg/min via INTRAVENOUS

## 2022-12-15 MED ORDER — PROPOFOL 10 MG/ML IV BOLUS
INTRAVENOUS | Status: DC | PRN
  Administered 2022-12-15: 60 mg via INTRAVENOUS

## 2022-12-15 MED ORDER — TAMSULOSIN HCL 0.4 MG PO CAPS
0.4000 mg | ORAL_CAPSULE | Freq: Every day | ORAL | Status: DC
  Administered 2022-12-15 (×2): 0.4 mg via ORAL
  Filled 2022-12-15 (×2): qty 1

## 2022-12-15 MED ORDER — SIMVASTATIN 20 MG PO TABS
20.0000 mg | ORAL_TABLET | Freq: Every day | ORAL | Status: DC
  Administered 2022-12-15 (×2): 20 mg via ORAL
  Filled 2022-12-15 (×2): qty 1

## 2022-12-15 MED ORDER — PHENYLEPHRINE HCL (PRESSORS) 10 MG/ML IV SOLN
INTRAVENOUS | Status: DC | PRN
  Administered 2022-12-15 (×4): 160 ug via INTRAVENOUS

## 2022-12-15 MED ORDER — FOLIC ACID 1 MG PO TABS
500.0000 ug | ORAL_TABLET | Freq: Every day | ORAL | Status: DC
  Administered 2022-12-15 – 2022-12-16 (×2): 0.5 mg via ORAL
  Filled 2022-12-15 (×2): qty 1

## 2022-12-15 MED ORDER — VITAMIN D 25 MCG (1000 UNIT) PO TABS
2000.0000 [IU] | ORAL_TABLET | Freq: Every day | ORAL | Status: DC
  Administered 2022-12-15 – 2022-12-16 (×2): 2000 [IU] via ORAL
  Filled 2022-12-15 (×2): qty 2

## 2022-12-15 SURGICAL SUPPLY — 15 items

## 2022-12-15 NOTE — Assessment & Plan Note (Signed)
-   Controlled.  Hemoglobin A1c 6.1 in September

## 2022-12-15 NOTE — Assessment & Plan Note (Addendum)
-   History of GI bleed with last hospitalization for rectal bleeding in December 2023.  Underwent colonoscopy in 01/2022  He underwent colonoscopy with 1 ascending polyp removed.  Had nonbleeding internal hemorrhoids.  Otherwise no evidence of recent GI bleed active bleeding. -Last endoscopy in 2020 with gastric erosion, single nonbleeding angiodysplastic lesion treated with APC -Presented with epigastric pain, nausea and melena for the past 3 days.  Last dose of warfarin was the morning of 11/3.  Currently subtherapeutic INR.  Will continue to hold. -EDP sent message to [facility] -Continue IV PPI

## 2022-12-15 NOTE — Assessment & Plan Note (Signed)
-  On coumadin with goal INR of 2-3 -subtherapeutic today at 1.6 -hold with active GI bleed pending GI consultation

## 2022-12-15 NOTE — Assessment & Plan Note (Signed)
Borderline hypotensive today.  Not on antihypertensives at home.

## 2022-12-15 NOTE — Anesthesia Postprocedure Evaluation (Signed)
Anesthesia Post Note  Patient: Joseph Malone  Procedure(s) Performed: ESOPHAGOGASTRODUODENOSCOPY (EGD) WITH PROPOFOL BIOPSY     Patient location during evaluation: Endoscopy Anesthesia Type: MAC Level of consciousness: awake and alert Pain management: pain level controlled Vital Signs Assessment: post-procedure vital signs reviewed and stable Respiratory status: spontaneous breathing, nonlabored ventilation, respiratory function stable and patient connected to nasal cannula oxygen Cardiovascular status: blood pressure returned to baseline and stable Postop Assessment: no apparent nausea or vomiting Anesthetic complications: no   No notable events documented.  Last Vitals:  Vitals:   12/15/22 1530 12/15/22 1551  BP: 134/73 127/83  Pulse: (!) 41 73  Resp: 19 (!) 22  Temp:  36.6 C  SpO2: 94% 100%    Last Pain:  Vitals:   12/15/22 1622  TempSrc:   PainSc: 0-No pain                 Trevor Iha

## 2022-12-15 NOTE — Plan of Care (Signed)
  Problem: Clinical Measurements: Goal: Ability to maintain clinical measurements within normal limits will improve Outcome: Progressing   Problem: Activity: Goal: Risk for activity intolerance will decrease Outcome: Progressing   Problem: Nutrition: Goal: Adequate nutrition will be maintained Outcome: Progressing   Problem: Elimination: Goal: Will not experience complications related to bowel motility Outcome: Progressing   Problem: Pain Management: Goal: General experience of comfort will improve Outcome: Progressing   Problem: Safety: Goal: Ability to remain free from injury will improve Outcome: Progressing   Problem: Education: Goal: Knowledge of risk factors and measures for prevention of condition will improve Outcome: Adequate for Discharge   Problem: Coping: Goal: Psychosocial and spiritual needs will be supported Outcome: Adequate for Discharge   Problem: Respiratory: Goal: Will maintain a patent airway Outcome: Adequate for Discharge Goal: Complications related to the disease process, condition or treatment will be avoided or minimized Outcome: Adequate for Discharge   Problem: Education: Goal: Knowledge of General Education information will improve Description: Including pain rating scale, medication(s)/side effects and non-pharmacologic comfort measures Outcome: Adequate for Discharge   Problem: Health Behavior/Discharge Planning: Goal: Ability to manage health-related needs will improve Outcome: Adequate for Discharge   Problem: Clinical Measurements: Goal: Will remain free from infection Outcome: Adequate for Discharge Goal: Diagnostic test results will improve Outcome: Adequate for Discharge Goal: Respiratory complications will improve Outcome: Adequate for Discharge Goal: Cardiovascular complication will be avoided Outcome: Adequate for Discharge   Problem: Coping: Goal: Level of anxiety will decrease Outcome: Adequate for Discharge    Problem: Elimination: Goal: Will not experience complications related to urinary retention Outcome: Adequate for Discharge   Problem: Skin Integrity: Goal: Risk for impaired skin integrity will decrease Outcome: Adequate for Discharge

## 2022-12-15 NOTE — H&P (Signed)
History and Physical    Patient: [name] ZOX:[id number] DOB: 11-09-1930 DOA: 12/14/2022 DOS: the patient was seen and examined on 12/15/2022 PCP: Shelva Majestic, MD  Patient coming from: Home  Chief Complaint:  Chief Complaint  Patient presents with   Melena   HPI: Joseph Malone is a 89 y.o. male with medical history significant of ICD, CAD, chronic combined systolic and diastolic heart failure, hypertension, type 2 diabetes, CKD 3B, BPH, history of left ventricular thrombus on warfarin who presents with melena, epigastric pain and nausea.  Reports 3 days of melena/hematochezia with epigastric pain and nausea but no vomiting.  No diarrhea.  Denies any NSAID or alcohol use.  Took last dose of warfarin on morning of 11/3.  He was recently discharged last week from the hospital with COVID and UTI. He has completed course of Paxlovid and antibiotics. Symptoms of both has resolved. However has been feeling weak since discharge.  He has history of GI bleed with rectal bleeding and was hospitalized 01/2022.  He underwent colonoscopy with 1 ascending polyp removed.  Had nonbleeding internal hemorrhoids.  Otherwise no evidence of recent GI bleed active bleeding.   On arrival to the ED, he was afebrile, borderline hypotensive with BP of 94/69 and tachycardia with heart rate of 114.  CBC with stable anemia of 14.9, leukocytosis of 14.4.  Lactate of 2.2. FOBT was positive with dark stool on exam per ED physician.  BMP notable for mild hyponatremia of 134, CO2 20, creatinine of 1.34 down from a prior of 1.44.  He was started on IV Protonix sent to [facility] to consult in the morning.  Hospitalist was consulted for  Review of Systems: As mentioned in the history of present illness. All other systems reviewed and are negative. Past Medical History:  Diagnosis Date   Anxiety    BPH (benign prostatic hypertrophy)    CAD (coronary artery disease)    s/p CABG   CHF (congestive heart  failure) (HCC)    Diabetes 1.5, managed as type 2 (HCC)    Diverticulosis    Diverticulosis of colon without hemorrhage 10/22/2007   Qualifier: Diagnosis of  By: Alwyn Ren MD, Chrissie Noa     DOE (dyspnea on exertion)    Excess weight    Fasting hyperglycemia    Generalized weakness    GERD (gastroesophageal reflux disease)    Heart attack (HCC) 2001   Heart failure    CHF due to ischemic CM   History of kidney stones    Hyperlipidemia    Hypertension    Ischemic cardiomyopathy    Microscopic hematuria    [facility]   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   Past Surgical History:  Procedure Laterality Date   APPENDECTOMY     BIOPSY  10/21/2018   Procedure: BIOPSY;  Surgeon: Benancio Deeds, MD;  Location: [facility];  Service: Gastroenterology;;   CARDIAC CATHETERIZATION  2000   CARDIAC DEFIBRILLATOR PLACEMENT  2005   [employer]; s/p ICD gen change and RV lead revision April 2015   CATARACT EXTRACTION  2008   bilateral with lens implant   COLONOSCOPY  2004   Tics; [facility]   COLONOSCOPY WITH PROPOFOL N/A 01/16/2022   Procedure: COLONOSCOPY WITH PROPOFOL;  Surgeon: Napoleon Form, MD;  Location: [facility];  Service: Gastroenterology;  Laterality: N/A;   CORONARY ARTERY BYPASS GRAFT  2000   1 vessel   CYSTOSCOPY  06/2012   Dr Margarita Grizzle  ESOPHAGOGASTRODUODENOSCOPY (EGD) WITH PROPOFOL N/A 10/21/2018   Procedure: ESOPHAGOGASTRODUODENOSCOPY (EGD) WITH PROPOFOL;  Surgeon: Benancio Deeds, MD;  Location: [facility];  Service: Gastroenterology;  Laterality: N/A;   HOT HEMOSTASIS N/A 10/21/2018   Procedure: HOT HEMOSTASIS (ARGON PLASMA COAGULATION/BICAP);  Surgeon: Benancio Deeds, MD;  Location: [facility];  Service: Gastroenterology;  Laterality: N/A;   IMPLANTABLE CARDIOVERTER DEFIBRILLATOR (ICD) GENERATOR CHANGE N/A 06/01/2013   Procedure: ICD GENERATOR CHANGE;  Surgeon: Marinus Maw, MD;  Location: [facility] CATH LAB;  Service: Cardiovascular;   Laterality: N/A;   LEAD REVISION N/A 06/01/2013   Procedure: LEAD REVISION;  Surgeon: Marinus Maw, MD;  Location: [facility] CATH LAB;  Service: Cardiovascular;  Laterality: N/A;   PILONIDAL CYST EXCISION     POLYPECTOMY  01/16/2022   Procedure: POLYPECTOMY;  Surgeon: Napoleon Form, MD;  Location: [facility];  Service: Gastroenterology;;   RIGHT/LEFT HEART CATH AND CORONARY/GRAFT ANGIOGRAPHY N/A 06/12/2017   Procedure: RIGHT/LEFT HEART CATH AND CORONARY/GRAFT ANGIOGRAPHY;  Surgeon: Dolores Patty, MD;  Location: [facility] INVASIVE CV LAB;  Service: Cardiovascular;  Laterality: N/A;   TRANSTHORACIC ECHOCARDIOGRAM  08/29/2005   Social History:  reports that he has never smoked. He has been exposed to tobacco smoke. He has never used smokeless tobacco. He reports that he does not drink alcohol and does not use drugs.  Allergies  Allergen Reactions   Penicillins Rash and Other (See Comments)    Because of a history of documented adverse serious drug reaction;Medi Alert bracelet  is recommended Has patient had a PCN reaction causing immediate rash, facial/tongue/throat swelling, SOB or lightheadedness with hypotension: No Has patient had a PCN reaction causing severe rash involving mucus membranes or skin necrosis: No Has patient had a PCN reaction that required hospitalization: No Has patient had a PCN reaction occurring within the last 10 years: No If all of the above answers are "NO", t    Family History  Problem Relation Age of Onset   Hypertension Father    Heart attack Father 46   Diabetes Brother        X 2   Nephrolithiasis Brother    Heart attack Brother 65   Heart attack Paternal Uncle 83   Sudden death Paternal Grandfather 65       ? heat stroke   Lung cancer Sister        "Asian lung cancer"   Stroke Neg Hx    Colon cancer Neg Hx    Esophageal cancer Neg Hx    Rectal cancer Neg Hx    Stomach cancer Neg Hx     Prior to Admission medications   Medication Sig Start Date  End Date Taking? Authorizing Provider  aspirin 81 MG tablet Take 1 tablet (81 mg total) by mouth every evening. 12/28/13  Yes Bensimhon, Bevelyn Buckles, MD  Cholecalciferol (VITAMIN D3) 2000 UNITS TABS Take 2,000 Units by mouth daily.   Yes [provider]  folic acid (FOLVITE) 400 MCG tablet Take 400 mcg by mouth daily.   Yes [provider]  Glucosamine HCl 1500 MG TABS Take 1,500 mg by mouth every evening.    Yes [provider]  Melatonin 5 MG CHEW Chew 1 tablet by mouth daily as needed (sleep).   Yes [provider]  metFORMIN (GLUCOPHAGE-XR) 500 MG 24 hr tablet TAKE 1 TABLET(500 MG) BY MOUTH DAILY WITH BREAKFAST 05/30/22  Yes Shelva Majestic, MD  Multiple Vitamin (MULTIVITAMIN WITH MINERALS) TABS Take 1 tablet by mouth daily.  Yes [provider]  nitroGLYCERIN (NITROSTAT) 0.4 MG SL tablet Place 1 tablet (0.4 mg total) under the tongue every 5 (five) minutes as needed for chest pain. 1 tablet every 5 minutes for 3 dose only 02/11/22 07/11/23 Yes Milford, Anderson Malta, FNP  simvastatin (ZOCOR) 40 MG tablet Take 0.5 tablets (20 mg total) by mouth at bedtime. 12/13/22  Yes Zigmund Daniel., MD  spironolactone (ALDACTONE) 25 MG tablet TAKE 1/2 TABLET BY MOUTH DAILY Patient taking differently: Take 12.5 mg by mouth as needed (weight gain if he picks up 3ib overnight.). 12/11/22  Yes Shelva Majestic, MD  tamsulosin (FLOMAX) 0.4 MG CAPS capsule Take 0.4 mg by mouth at bedtime. 12/13/19  Yes [provider]  vitamin B-12 (CYANOCOBALAMIN) 100 MCG tablet Take 100 mcg by mouth daily.   Yes [provider]  warfarin (COUMADIN) 5 MG tablet TAKE 1 TABLET BY MOUTH ON MONDAY, WEDNESDAY, AND FRIDAY, AND TAKE ONE-HALF TABLET ALL OTHER DAYS OR AS DIRECTED Patient taking differently: Take 2.5-5 mg by mouth See admin instructions. Take 1/2 tablet by mouth on Monday and 1 tablet all other days or as directed by [facility]. 05/15/22  Yes Shelva Majestic,  MD    Physical Exam: Vitals:   12/14/22 1954 12/14/22 2308 12/14/22 2323 12/15/22 0000  BP: (!) 143/91 (!) 95/49  (!) 139/94  Pulse: 80 89  72  Resp: 19 19  19   Temp:   (!) 97.4 F (36.3 C)   TempSrc:   Oral   SpO2: 95% 95%  95%  Weight:      Height:       Constitutional: NAD, calm, comfortable, obese male laying in bed asleep and awoke easily to voice Eyes:  lids and conjunctivae normal ENMT: Mucous membranes are moist.  Neck: normal, supple Respiratory: clear to auscultation bilaterally, no wheezing, no crackles. Normal respiratory effort. No accessory muscle use.  Cardiovascular: Regular rate and rhythm, no murmurs / rubs / gallops. No extremity edema.  Abdomen: Soft, nontender nondistended.  No rebound tenderness guarding or rigidity.   Rectal: Exam deferred Musculoskeletal: no clubbing / cyanosis. No joint deformity upper and lower extremities. . Normal muscle tone.  Skin: no rashes, lesions, ulcers. No induration Neurologic: CN 2-12 grossly intact.   Psychiatric: Normal judgment and insight. Alert and oriented x 3. Normal mood. Data Reviewed:  See HPI  Assessment and Plan: * GI bleeding - History of GI bleed with last hospitalization for rectal bleeding in December 2023.  Underwent colonoscopy in 01/2022 with internal hemorrhoids and no signs of any bleeding. -Last endoscopy in 2020 with gastric erosion, single nonbleeding angiodysplastic lesion treated with APC -Presented with epigastric pain, nausea and melena for the past 3 days.  Last dose of warfarin was the morning of 11/3.  Currently subtherapeutic INR.  Will continue to hold. -EDP sent message to [facility] -Continue IV PPI  BPH (benign prostatic hyperplasia) - Continue tamsulosin  Combined congestive systolic and diastolic heart failure (HCC) Stable and compensated  Left ventricular thrombus -On coumadin with goal INR of 2-3 -subtherapeutic today at 1.6 -hold with active GI bleed pending GI  consultation  Non-insulin dependent type 2 diabetes mellitus (HCC) - Controlled.  Hemoglobin A1c 6.1 in September  Essential hypertension Borderline hypotensive today.  Not on antihypertensives at home.  Hyperlipidemia Continue statin      Advance Care Planning:   Code Status: Full Code   Consults: [facility]  Family Communication: None at bedside  Severity  of Illness: The appropriate patient status for this patient is OBSERVATION. Observation status is judged to be reasonable and necessary in order to provide the required intensity of service to ensure the patient's safety. The patient's presenting symptoms, physical exam findings, and initial radiographic and laboratory data in the context of their medical condition is felt to place them at decreased risk for further clinical deterioration. Furthermore, it is anticipated that the patient will be medically stable for discharge from the hospital within 2 midnights of admission.   Author: Anselm Jungling, DO 12/15/2022 1:13 AM  For on call review [email or web address].

## 2022-12-15 NOTE — Progress Notes (Signed)
This is a very pleasant 89 year old gentleman with history of CAD, chronic combined systolic and diastolic heart failure, CKD stage IIIa, BPH, left ventricular thrombus on warfarin who came in with a complaint of melena as well as some hematochezia for last 3 days, admitted after midnight.  Patient seen and examined, still in the ED.  Has no new complaint.  Patient's hemoglobin has remained stable.  Awaiting GI to see him.  Patient is n.p.o. in anticipation of possible EGD.  Holding Coumadin.

## 2022-12-15 NOTE — Assessment & Plan Note (Signed)
Continue statin. 

## 2022-12-15 NOTE — Anesthesia Preprocedure Evaluation (Addendum)
Anesthesia Evaluation  Patient identified by MRN, date of birth, ID band Patient awake    Reviewed: Allergy & Precautions, NPO status , Patient's Chart, lab work & pertinent test results  Airway Mallampati: II  TM Distance: >3 FB Neck ROM: Full    Dental no notable dental hx. (+) Teeth Intact, Dental Advisory Given   Pulmonary neg pulmonary ROS   Pulmonary exam normal breath sounds clear to auscultation       Cardiovascular hypertension, + CAD, + Past MI, + CABG and +CHF  Normal cardiovascular exam+ Cardiac Defibrillator  Rhythm:Regular Rate:Normal  Ischemic cardiomyopathy 07/25/2022 Echo  1. Left ventricular ejection fraction, by estimation, is 30 to 35%. The  left ventricle has moderately decreased function. The left ventricle  demonstrates regional wall motion abnormalities (see scoring  diagram/findings for description). There is mild  left ventricular hypertrophy. Left ventricular diastolic parameters are  indeterminate.   2. Right ventricular systolic function is mildly reduced. The right  ventricular size is normal. There is normal pulmonary artery systolic  pressure. The estimated right ventricular systolic pressure is 24.7 mmHg.   3. The mitral valve is grossly normal. Trivial mitral valve  regurgitation. No evidence of mitral stenosis.   4. The aortic valve is grossly normal. There is mild thickening of the  aortic valve. Aortic valve regurgitation is not visualized. No aortic  stenosis is present.   5. The inferior vena cava is normal in size with greater than 50%  respiratory variability, suggesting right atrial pressure of 3 mmHg.      Neuro/Psych   Anxiety     negative neurological ROS     GI/Hepatic ,GERD  Medicated and Controlled,,  Endo/Other  diabetes, Type 2    Renal/GU Renal disease     Musculoskeletal  (+) Arthritis ,    Abdominal   Peds  Hematology On Coumadin  Lab Results       Component                Value               Date                      WBC                      15.2 (H)            12/15/2022                HGB                      14.6                12/15/2022                HCT                      44.0                12/15/2022                MCV                      91.9                12/15/2022                PLT  297                 12/15/2022              Anesthesia Other Findings All: Pcn  Reproductive/Obstetrics                             Anesthesia Physical Anesthesia Plan  ASA: 4  Anesthesia Plan: MAC   Post-op Pain Management:    Induction: Intravenous  PONV Risk Score and Plan: Treatment may vary due to age or medical condition and Propofol infusion  Airway Management Planned: Nasal Cannula and Natural Airway  Additional Equipment: None  Intra-op Plan:   Post-operative Plan:   Informed Consent: I have reviewed the patients History and Physical, chart, labs and discussed the procedure including the risks, benefits and alternatives for the proposed anesthesia with the patient or authorized representative who has indicated his/her understanding and acceptance.     Dental advisory given  Plan Discussed with: CRNA  Anesthesia Plan Comments:         Anesthesia Quick Evaluation

## 2022-12-15 NOTE — Op Note (Signed)
[facility] Patient Name: Joseph Malone Procedure Date: 12/15/2022 MRN: [id number] Attending MD: Wilhemina Bonito. Marina Goodell , MD, [id number] Date of Birth: 23-Jul-1930 CSN: [id number] Age: 89 Admit Type: Inpatient Procedure:                Upper GI endoscopy with biopsies Indications:              Epigastric abdominal pain, Heme positive stool,                            dark stools. Fatigue. Normal hemoglobin Providers:                Wilhemina Bonito. Marina Goodell, MD, Margaree Mackintosh, RN, Harrington Challenger, Technician Referring MD:             [facility] hospitalist Medicines:                Monitored Anesthesia Care Complications:            No immediate complications. Estimated Blood Loss:     Estimated blood loss: none. Procedure:                Pre-Anesthesia Assessment:                           - Prior to the procedure, a History and Physical                            was performed, and patient medications and                            allergies were reviewed. The patient's tolerance of                            previous anesthesia was also reviewed. The risks                            and benefits of the procedure and the sedation                            options and risks were discussed with the patient.                            All questions were answered, and informed consent                            was obtained. Prior Anticoagulants: The patient has                            taken Coumadin (warfarin), last dose was 2 days                            prior to procedure. ASA Grade Assessment: III - A  patient with severe systemic disease. After                            reviewing the risks and benefits, the patient was                            deemed in satisfactory condition to undergo the                            procedure.                           After obtaining informed consent, the endoscope was                             passed under direct vision. Throughout the                            procedure, the patient's blood pressure, pulse, and                            oxygen saturations were monitored continuously. The                            GIF-H190 ([id number]) Olympus endoscope was introduced                            through the mouth, and advanced to the second part                            of duodenum. The upper GI endoscopy was                            accomplished without difficulty. The patient                            tolerated the procedure well. Scope In: Scope Out: Findings:      The esophagus revealed a large caliber ringlike distal esophageal       stricture to gastroesophageal junction. The esophagus was otherwise       normal.      The stomach revealed a moderate hiatal hernia.      Multiple superficial gastric ulcers, measuring between 3 and 6 mm, were       found in the gastric antrum. Biopsies were taken with a cold forceps for       histology to rule out H. pylori..      The examined duodenum revealed an 8 mm clean-based ulcer.      The cardia and gastric fundus were normal on retroflexion, except for       hiatal hernia. Impression:               1. Incidental esophageal stricture                           2. Hiatal hernia  3. Multiple clean-based gastric and duodenal ulcers                            as described. Status post biopsies of the gastric                            antrum. Moderate Sedation:      none Recommendation:           - Patient has a contact number available for                            emergencies. The signs and symptoms of potential                            delayed complications were discussed with the                            patient. Return to normal activities tomorrow.                            Written discharge instructions were provided to the                            patient.                           -  Resume previous diet.                           - Continue present medications.                           - Await pathology results.                           - AVOID ALL NSAIDS (medications like Advil, Aleve,                            ibuprofen)                           - Pantoprazole 40 mg p.o. twice daily for 4 weeks                            then 40 mg once daily INDEFINITELY to reduce risk                            of recurrent ulcers in a patient on chronic                            anticoagulation                           - Okay to go home in a.m. if doing well                           -  Okay to resume Coumadin in 48 hours                           Discussed with patient. He was provided a copy of                            this report. I attempted to contact his daughter at                            [phone]. Voicemail stated that number was                            unavailable at this time. Procedure Code(s):        --- Professional ---                           [phone], Esophagogastroduodenoscopy, flexible,                            transoral; with biopsy, single or multiple Diagnosis Code(s):        --- Professional ---                           K25.9, Gastric ulcer, unspecified as acute or                            chronic, without hemorrhage or perforation                           R10.13, Epigastric pain                           R19.5, Other fecal abnormalities CPT copyright 2022 American Medical Association. All rights reserved. The codes documented in this report are preliminary and upon coder review may  be revised to meet current compliance requirements. Wilhemina Bonito. Marina Goodell, MD 12/15/2022 3:17:23 PM This report has been signed electronically. Number of Addenda: 0

## 2022-12-15 NOTE — Transfer of Care (Signed)
Immediate Anesthesia Transfer of Care Note  Patient: Joseph Malone  Procedure(s) Performed: ESOPHAGOGASTRODUODENOSCOPY (EGD) WITH PROPOFOL BIOPSY  Patient Location: Endoscopy Unit  Anesthesia Type:MAC  Level of Consciousness: drowsy and responds to stimulation  Airway & Oxygen Therapy: Patient Spontanous Breathing and Patient connected to face mask oxygen  Post-op Assessment: Report given to RN and Post -op Vital signs reviewed and stable  Post vital signs: Reviewed and stable  Last Vitals:  Vitals Value Taken Time  BP 91/60 12/15/22 1511  Temp    Pulse 69 12/15/22 1514  Resp 24 12/15/22 1514  SpO2 97 % 12/15/22 1514  Vitals shown include unfiled device data.  Last Pain:  Vitals:   12/15/22 1511  TempSrc:   PainSc: Asleep      Patients Stated Pain Goal: 0 (12/14/22 1756)  Complications: No notable events documented.

## 2022-12-15 NOTE — Assessment & Plan Note (Signed)
Continue tamsulosin.

## 2022-12-15 NOTE — Assessment & Plan Note (Signed)
Stable and compensated. 

## 2022-12-15 NOTE — Consult Note (Addendum)
Referring Provider: Dr. Elayne Snare  Primary Care Physician:  Shelva Majestic, MD Primary Gastroenterologist:  Dr. Adela Lank   Reason for Consultation:  GI bleed/melena   HPI: Joseph Malone is a 89 y.o. male with a past medical history of anxiety, hypertension, hyperlipidemia, coronary artery disease s/p MI and CABG 2000, ischemic cardiomyopathy s/p ICD placement, combined chronic systolic and diastolic CHF with LV EF 30 - 35%, LV thrombus on Warfarin, DM type II, CKD stage IIIb, kidney stones, GERD duodenal, diverticulosis and tubular adenomatous colon polyp.   He was recently admitted to the hospital 10/25 - 12/10/2022 secondary to generalized weakness and UTI symptoms.  His initial evaluation was consistent with UTI and he received IV antibiotics.  He tested positive for COVID with possible pneumonia on chest x-ray therefore he was started on Paxlovid and Decadron.  He required supplemental oxygen.  His clinical status stabilized and was discharged home 10/30 on Cefadroxil x 7 days.   He developed epigastric pain and black stools for the past 2 to 3 days therefore he presented to the ED 11s 04/2022 for evaluation.  Admission labs showed a WBC count of 14.4 (Hg 14.5 on 10/30). Hemoglobin 14.9.  Hematocrit 43.3.  Platelet 302.  BUN 57 up from 42 on 10/30.  Creatinine 1.37 down from creatinine 1.44 on 10/30.  Albumin 3.3.  Total bili 0.3.  Alk phos 53.  AST 23.  ALT 32.  Lipase 94.  Lactic acid 2.2.  FOBT positive.  Repeat lactic acid level 1.3. He endorsed having significant fatigue since he was discharged home ( staying with his daughter) 12/10/2022 as noted above. He stated passing a few solid black stools x 3 days, not every day since he was discharged home. He started having epigastric pain prior to his 10/25 admission and sometimes saw streaks of  red blood on his stool. He denies having any heartburn. No dysphagia but describes having a globus sensation, chews his food very carefully.  Not on PPI at home. Last dose of Sunday 11/3 in the morning. He takes ASA 81mg  Q HS. He has chest pain if he moves around in the stretcher, non chest pain at this time. He had intermittent dyspnea with exertion. He underwent an EGD 10/2018 which identified a 1 cm hiatal hernia, few gastric erosions, a single nonbleeding AVM was noted in the duodenum treated with APC and peptic duodenitis.  He was previously admitted to the hospital in 01/14/2022 with rectal bleeding. Patient noted blood was mostly dark nut noted bright red blood when he wiped himself on the toilet paper.  In the ED when our team evaluated him there was frank hematochezia on rectal exam.  It was thought that he was likely having a lower GI bleed. His hemoglobin remained relatively normal during initial intake. He had a colonoscopy done with Dr. Lavon Paganini on 01/16/2022, poor prep noted without blood however diverticulosis, 1 small adenoma, internal hemorrhoids.  It was suspected perhaps he had a hemorrhoidal bleed versus diverticular source. EGD was not pursued.   ECHO 07/25/2022: IMPRESSIONS Left ventricular ejection fraction, by estimation, is 30 to 35%. The left ventricle has moderately decreased function. The left ventricle demonstrates regional wall motion abnormalities (see scoring diagram/findings for description). There is mild left ventricular hypertrophy. Left ventricular diastolic parameters are indeterminate. 1. Right ventricular systolic function is mildly reduced. The right ventricular size is normal. There is normal pulmonary artery systolic pressure. The estimated right ventricular systolic pressure is 24.7 mmHg. 2. The mitral  valve is grossly normal. Trivial mitral valve regurgitation. No evidence of mitral stenosis. 3. The aortic valve is grossly normal. There is mild thickening of the aortic valve. Aortic valve regurgitation is not visualized. No aortic stenosis is present. 4. The inferior vena cava is normal in size with greater  than 50% respiratory variability, suggesting right atrial pressure of 3 mmHg.  GI PROCEDURES:  Colonoscopy 01/16/22: Dr. Lavon Paganini - poor prep The perianal and digital rectal examinations were normal. A 5 mm polyp was found in the ascending colon. The polyp was sessile. The polyp was removed with a cold snare. Resection and retrieval were complete. Multiple large-mouthed diverticula were found in the sigmoid colon and descending colon. Non-bleeding internal hemorrhoids were found during retroflexion. The hemorrhoids were medium-sized. The exam was otherwise without abnormality with brown stool, no evidence of recent GI bleed or active bleed.   FINAL MICROSCOPIC DIAGNOSIS:   A. ASCENDING COLON POLYP, BIOPSY:  - Tubular adenoma  - Negative for high-grade dysplasia or malignancy   EGD 10/2018: - Esophagogastric landmarks identified. - 1 cm hiatal hernia. - Normal esophagus - A few gastric erosions, no focal ulcers, slightly nodular mucosa in the antrum - biopsies taken to rule out H pylori - Normal stomach otherwise - A single non-bleeding angiodysplastic lesion in the duodenum. Treated with argon plasma coagulation (APC). - Mucosal changes in the duodenum - normal variant versus adenomatous change. Biopsied.   1. Duodenum, Biopsy - PEPTIC DUODENITIS. 2. Stomach, biopsy, intrabody - GASTRIC ANTRAL MUCOSA WITH MILD REACTIVE GASTROPATHY. - GASTRIC OXYNTIC MUCOSA WITH MILD CHRONIC GASTRITIS. - WARTHIN-STARRY STAIN IS NEGATIVE FOR HELICOBACTER PYLORI.   Colonoscopy 2004, noted to have diverticulosis.    Past Medical History:  Diagnosis Date   Anxiety    BPH (benign prostatic hypertrophy)    CAD (coronary artery disease)    s/p CABG   CHF (congestive heart failure) (HCC)    Diabetes 1.5, managed as type 2 (HCC)    Diverticulosis    Diverticulosis of colon without hemorrhage 10/22/2007   Qualifier: Diagnosis of  By: Alwyn Ren MD, Chrissie Noa     DOE (dyspnea on exertion)    Excess  weight    Fasting hyperglycemia    Generalized weakness    GERD (gastroesophageal reflux disease)    Heart attack (HCC) 2001   Heart failure    CHF due to ischemic CM   History of kidney stones    Hyperlipidemia    Hypertension    Ischemic cardiomyopathy    Microscopic hematuria    [facility]   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    Past Surgical History:  Procedure Laterality Date   APPENDECTOMY     BIOPSY  10/21/2018   Procedure: BIOPSY;  Surgeon: Benancio Deeds, MD;  Location: [facility];  Service: Gastroenterology;;   CARDIAC CATHETERIZATION  2000   CARDIAC DEFIBRILLATOR PLACEMENT  2005   [employer]; s/p ICD gen change and RV lead revision April 2015   CATARACT EXTRACTION  2008   bilateral with lens implant   COLONOSCOPY  2004   Tics; [facility]   COLONOSCOPY WITH PROPOFOL N/A 01/16/2022   Procedure: COLONOSCOPY WITH PROPOFOL;  Surgeon: Napoleon Form, MD;  Location: [facility];  Service: Gastroenterology;  Laterality: N/A;   CORONARY ARTERY BYPASS GRAFT  2000   1 vessel   CYSTOSCOPY  06/2012   Dr Margarita Grizzle   ESOPHAGOGASTRODUODENOSCOPY (EGD) WITH PROPOFOL N/A 10/21/2018   Procedure: ESOPHAGOGASTRODUODENOSCOPY (EGD) WITH PROPOFOL;  Surgeon: Adela Lank,  Willaim Rayas, MD;  Location: [facility];  Service: Gastroenterology;  Laterality: N/A;   HOT HEMOSTASIS N/A 10/21/2018   Procedure: HOT HEMOSTASIS (ARGON PLASMA COAGULATION/BICAP);  Surgeon: Benancio Deeds, MD;  Location: [facility];  Service: Gastroenterology;  Laterality: N/A;   IMPLANTABLE CARDIOVERTER DEFIBRILLATOR (ICD) GENERATOR CHANGE N/A 06/01/2013   Procedure: ICD GENERATOR CHANGE;  Surgeon: Marinus Maw, MD;  Location: [facility] CATH LAB;  Service: Cardiovascular;  Laterality: N/A;   LEAD REVISION N/A 06/01/2013   Procedure: LEAD REVISION;  Surgeon: Marinus Maw, MD;  Location: [facility] CATH LAB;  Service: Cardiovascular;  Laterality: N/A;   PILONIDAL CYST EXCISION     POLYPECTOMY  01/16/2022    Procedure: POLYPECTOMY;  Surgeon: Napoleon Form, MD;  Location: [facility];  Service: Gastroenterology;;   RIGHT/LEFT HEART CATH AND CORONARY/GRAFT ANGIOGRAPHY N/A 06/12/2017   Procedure: RIGHT/LEFT HEART CATH AND CORONARY/GRAFT ANGIOGRAPHY;  Surgeon: Dolores Patty, MD;  Location: [facility] INVASIVE CV LAB;  Service: Cardiovascular;  Laterality: N/A;   TRANSTHORACIC ECHOCARDIOGRAM  08/29/2005    Prior to Admission medications   Medication Sig Start Date End Date Taking? Authorizing Provider  aspirin 81 MG tablet Take 1 tablet (81 mg total) by mouth every evening. 12/28/13  Yes Bensimhon, Bevelyn Buckles, MD  Cholecalciferol (VITAMIN D3) 2000 UNITS TABS Take 2,000 Units by mouth daily.   Yes [provider]  folic acid (FOLVITE) 400 MCG tablet Take 400 mcg by mouth daily.   Yes [provider]  Glucosamine HCl 1500 MG TABS Take 1,500 mg by mouth every evening.    Yes [provider]  Melatonin 5 MG CHEW Chew 1 tablet by mouth daily as needed (sleep).   Yes [provider]  metFORMIN (GLUCOPHAGE-XR) 500 MG 24 hr tablet TAKE 1 TABLET(500 MG) BY MOUTH DAILY WITH BREAKFAST 05/30/22  Yes Shelva Majestic, MD  Multiple Vitamin (MULTIVITAMIN WITH MINERALS) TABS Take 1 tablet by mouth daily.    Yes [provider]  nitroGLYCERIN (NITROSTAT) 0.4 MG SL tablet Place 1 tablet (0.4 mg total) under the tongue every 5 (five) minutes as needed for chest pain. 1 tablet every 5 minutes for 3 dose only 02/11/22 07/11/23 Yes Milford, Anderson Malta, FNP  simvastatin (ZOCOR) 40 MG tablet Take 0.5 tablets (20 mg total) by mouth at bedtime. 12/13/22  Yes Zigmund Daniel., MD  spironolactone (ALDACTONE) 25 MG tablet TAKE 1/2 TABLET BY MOUTH DAILY Patient taking differently: Take 12.5 mg by mouth as needed (weight gain if he picks up 3ib overnight.). 12/11/22  Yes Shelva Majestic, MD  tamsulosin (FLOMAX) 0.4 MG CAPS capsule Take 0.4 mg by mouth at bedtime. 12/13/19  Yes [provider]  vitamin B-12 (CYANOCOBALAMIN) 100 MCG tablet Take 100 mcg by mouth daily.   Yes [provider]  warfarin (COUMADIN) 5 MG tablet TAKE 1 TABLET BY MOUTH ON MONDAY, WEDNESDAY, AND FRIDAY, AND TAKE ONE-HALF TABLET ALL OTHER DAYS OR AS DIRECTED Patient taking differently: Take 2.5-5 mg by mouth See admin instructions. Take 1/2 tablet by mouth on Monday and 1 tablet all other days or as directed by [facility]. 05/15/22  Yes Shelva Majestic, MD    Current Facility-Administered Medications  Medication Dose Route Frequency Provider Last Rate Last Admin   cholecalciferol (VITAMIN D3) 25 MCG (1000 UNIT) tablet 2,000 Units  2,000 Units Oral Daily Tu, Ching T, DO       folic acid (FOLVITE) tablet 0.5 mg  500 mcg Oral Daily Tu, Ching  T, DO       pantoprazole (PROTONIX) injection 40 mg  40 mg Intravenous Q12H Elayne Snare K, DO   40 mg at 12/15/22 4098   simvastatin (ZOCOR) tablet 20 mg  20 mg Oral QHS Tu, Ching T, DO   20 mg at 12/15/22 0124   tamsulosin (FLOMAX) capsule 0.4 mg  0.4 mg Oral QHS Tu, Ching T, DO   0.4 mg at 12/15/22 0124   vitamin B-12 (CYANOCOBALAMIN) tablet 100 mcg  100 mcg Oral Daily Tu, Ching T, DO       Current Outpatient Medications  Medication Sig Dispense Refill   aspirin 81 MG tablet Take 1 tablet (81 mg total) by mouth every evening.     Cholecalciferol (VITAMIN D3) 2000 UNITS TABS Take 2,000 Units by mouth daily.     folic acid (FOLVITE) 400 MCG tablet Take 400 mcg by mouth daily.     Glucosamine HCl 1500 MG TABS Take 1,500 mg by mouth every evening.      Melatonin 5 MG CHEW Chew 1 tablet by mouth daily as needed (sleep).     metFORMIN (GLUCOPHAGE-XR) 500 MG 24 hr tablet TAKE 1 TABLET(500 MG) BY MOUTH DAILY WITH BREAKFAST 90 tablet 3   Multiple Vitamin (MULTIVITAMIN WITH MINERALS) TABS Take 1 tablet by mouth daily.      nitroGLYCERIN (NITROSTAT) 0.4 MG SL tablet Place 1 tablet (0.4 mg total) under the tongue every 5 (five) minutes as needed for  chest pain. 1 tablet every 5 minutes for 3 dose only 90 tablet 3   simvastatin (ZOCOR) 40 MG tablet Take 0.5 tablets (20 mg total) by mouth at bedtime.     spironolactone (ALDACTONE) 25 MG tablet TAKE 1/2 TABLET BY MOUTH DAILY (Patient taking differently: Take 12.5 mg by mouth as needed (weight gain if he picks up 3ib overnight.).) 90 tablet 3   tamsulosin (FLOMAX) 0.4 MG CAPS capsule Take 0.4 mg by mouth at bedtime.     vitamin B-12 (CYANOCOBALAMIN) 100 MCG tablet Take 100 mcg by mouth daily.     warfarin (COUMADIN) 5 MG tablet TAKE 1 TABLET BY MOUTH ON MONDAY, WEDNESDAY, AND FRIDAY, AND TAKE ONE-HALF TABLET ALL OTHER DAYS OR AS DIRECTED (Patient taking differently: Take 2.5-5 mg by mouth See admin instructions. Take 1/2 tablet by mouth on Monday and 1 tablet all other days or as directed by [facility].) 100 tablet 1    Allergies as of 12/14/2022 - Review Complete 12/14/2022  Allergen Reaction Noted   Penicillins Rash and Other (See Comments)     Family History  Problem Relation Age of Onset   Hypertension Father    Heart attack Father 23   Diabetes Brother        X 2   Nephrolithiasis Brother    Heart attack Brother 73   Heart attack Paternal Uncle 64   Sudden death Paternal Grandfather 77       ? heat stroke   Lung cancer Sister        "Asian lung cancer"   Stroke Neg Hx    Colon cancer Neg Hx    Esophageal cancer Neg Hx    Rectal cancer Neg Hx    Stomach cancer Neg Hx     Social History   Socioeconomic History   Marital status: Married    Spouse name: Not on file   Number of children: 4   Years of education: Not on file   Highest education level: Not on file  Occupational History   Occupation: Retired  Tobacco Use   Smoking status: Never    Passive exposure: Yes   Smokeless tobacco: Never  Vaping Use   Vaping status: Never Used  Substance and Sexual Activity   Alcohol use: No   Drug use: No   Sexual activity: Never  Other Topics Concern   Not on file   Social History Narrative   Married 1957. 4 children 2 boys 2 girls. 15 grandkids.  4 greatgrandkids.       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       Hobbies: genealogy, travel      No HCPOA-advised to do this.    Social Determinants of Health   Financial Resource Strain: Low Risk  (08/21/2022)   Overall Financial Resource Strain (CARDIA)    Difficulty of Paying Living Expenses: Not hard at all  Food Insecurity: No Food Insecurity (12/11/2022)   Hunger Vital Sign    Worried About Running Out of Food in the Last Year: Never true    Ran Out of Food in the Last Year: Never true  Transportation Needs: No Transportation Needs (12/11/2022)   PRAPARE - Administrator, Civil Service (Medical): No    Lack of Transportation (Non-Medical): No  Physical Activity: Insufficiently Active (08/21/2022)   Exercise Vital Sign    Days of Exercise per Week: 2 days    Minutes of Exercise per Session: 50 min  Stress: No Stress Concern Present (08/21/2022)   [employer] of Occupational Health - Occupational Stress Questionnaire    Feeling of Stress : Not at all  Social Connections: Socially Integrated (08/21/2022)   Social Connection and Isolation Panel [NHANES]    Frequency of Communication with Friends and Family: More than three times a week    Frequency of Social Gatherings with Friends and Family: More than three times a week    Attends Religious Services: More than 4 times per year    Active Member of [employer] or Organizations: Yes    Attends Banker Meetings: 1 to 4 times per year    Marital Status: Married  Catering manager Violence: Not At Risk (12/06/2022)   Humiliation, Afraid, Rape, and Kick questionnaire    Fear of Current or Ex-Partner: No    Emotionally Abused: No    Physically Abused: No    Sexually Abused: No    Review of Systems: Gen: Denies fever, sweats or chills. No weight loss.  CV: Denies chest  pain, palpitations or edema. Resp: Denies cough, shortness of breath of hemoptysis.  GI:See HPI.  GU : Denies urinary burning, blood in urine, increased urinary frequency or incontinence. MS: Denies joint pain, muscles aches or weakness. Derm: Denies rash, itchiness, skin lesions or unhealing ulcers. Psych: Denies depression, anxiety, memory loss or confusion. Heme: Denies easy bruising, bleeding. Neuro:  Denies headaches, dizziness or paresthesias. Endo: + Dm  type II.  Physical Exam: Vital signs in last 24 hours: Temp:  [97.4 F (36.3 C)-98.2 F (36.8 C)] 97.5 F (36.4 C) (11/04 0723) Pulse Rate:  [72-114] 76 (11/04 0723) Resp:  [16-19] 16 (11/04 0723) BP: (94-143)/(49-94) 140/90 (11/04 0723) SpO2:  [90 %-97 %] 94 % (11/04 0723) Weight:  [96 kg] 96 kg (11/03 1756)   General: Alert 89 year old male in no acute distress. Head:  Normocephalic and atraumatic. Eyes:  No scleral icterus. Conjunctiva pink. Ears:  Normal auditory acuity. Nose:  No deformity, discharge or lesions.  Mouth:  Dentition intact. No ulcers or lesions.  Neck:  Supple. No lymphadenopathy or thyromegaly.  Lungs: Breath sounds clear throughout. No wheezes, rhonchi or crackles.  Heart: Regular rhythm, no murmurs. Abdomen: Left, nondistended.  Nontender.  Positive bowel sounds to all 4 quadrants.  No hepatosplenomegaly.  No bruit. Rectal: No external hemorrhoids.  Rectal vault cold with formed very dark brown stool.  No bright red blood.  CMA present during exam. Musculoskeletal:  Symmetrical without gross deformities.  Pulses:  Normal pulses noted. Extremities:  Without clubbing or edema. Neurologic:  Alert and  oriented x 4. No focal deficits.  Skin:  Intact without significant lesions or rashes. Psych:  Alert and cooperative. Normal mood and affect.  Intake/Output from previous day: No intake/output data recorded. Intake/Output this shift: No intake/output data recorded.  Lab Results: Recent Labs     12/14/22 1935 12/15/22 0500  WBC 14.4* 15.2*  HGB 14.9 14.6  HCT 43.3 44.0  PLT 302 297   BMET Recent Labs    12/14/22 1935 12/15/22 0500  NA 134* 136  K 4.6 4.5  CL 103 105  CO2 20* 18*  GLUCOSE 187* 159*  BUN 57* 51*  CREATININE 1.37* 1.36*  CALCIUM 9.4 9.2   LFT Recent Labs    12/14/22 1935  PROT 7.0  ALBUMIN 3.3*  AST 23  ALT 32  ALKPHOS 53  BILITOT 0.3   PT/INR Recent Labs    12/14/22 1935  LABPROT 19.3*  INR 1.6*   Hepatitis Panel No results for input(s): "HEPBSAG", "HCVAB", "HEPAIGM", "HEPBIGM" in the last 72 hours.    Studies/Results: No results found.  IMPRESSION/PLAN:  89 year old male with a history of GERD, small hiatal hernia and a duodenal AVM admitted with epigastric pain, melena/suspected upper GI bleed.  Stable Hg 14.9. Mildly elevated BUN.  FOBT +. Rectal exam today showed very dark brown solid stool. Last dose of Warfarin was yesterday morning. On ASA every day.  Hemodynamically stable. -Clear liquid diet  -Check H&H every 6 hours x 24 hours -Transfuse for hemoglobin level less than 8 or as needed if symptomatic -Continue to hold Warfarin -Continue Pantoprazole 40mg  IV bid -CBC, BMP and INR in am -EGD during this hospitalization, benefits and risks discussed including risk with sedation, risk of bleeding, perforation and infection. Await further recommendations per Dr. Marina Goodell   History of a tubular adenomatous polyp per colonoscopy done during hospitalization secondary to hematochezia 01/2022, prep was poor. -Defer endoscopic recommendations to Dr. Marina Goodell  Coronary artery disease s/p CABG -Recommend telemetry, patient was not on the monitor in the ED  Ischemic cardiomyopathy s/p ICD  Combined chronic systolic and diastolic CHF with LVEF 30 to 35%  LV thrombus on Warfarin.  -Warfarin on hold -Consider ECHO during this hospitalization  CKD stge IIb  DM type II  Recently hospitalization with Covid pneumonia treated with  Paxlovid, Decadron and supplemental oxygen  Recent hospitalization with UTI treated with antibiotics   Arnaldo Natal  12/15/2022, 9:49AM  GI ATTENDING  History, laboratories, x-rays, prior GI evaluations personally reviewed.  Patient personally seen and examined.  Agree with comprehensive consultation note as outlined above.  Patient presents with fatigue after very recent COVID-related pneumonia and UTI.  Reported dark stools.  Noted to be Hemoccult positive.  Hemoglobin quite stable.  Hemodynamically stable.  On chronic anticoagulation.  Will perform upper endoscopy to exclude any significant GI pathology prior to resuming chronic anticoagulation.  I suspect he has issues with fatigue related to his  2 recent infections.  The patient is higher than average risk due to his advanced age and comorbidities.  The nature of the procedure, as well as the risks, benefits, and alternatives were carefully and thoroughly reviewed with the patient. Ample time for discussion and questions allowed. The patient understood, was satisfied, and agreed to proceed.  Wilhemina Bonito. Eda Keys., M.D. [facility] Division of Gastroenterology

## 2022-12-16 ENCOUNTER — Inpatient Hospital Stay: Payer: Medicare Other | Admitting: Physician Assistant

## 2022-12-16 DIAGNOSIS — K259 Gastric ulcer, unspecified as acute or chronic, without hemorrhage or perforation: Secondary | ICD-10-CM | POA: Diagnosis not present

## 2022-12-16 DIAGNOSIS — K921 Melena: Secondary | ICD-10-CM | POA: Diagnosis not present

## 2022-12-16 LAB — BASIC METABOLIC PANEL
Anion gap: 9 (ref 5–15)
BUN: 40 mg/dL — ABNORMAL HIGH (ref 8–23)
CO2: 20 mmol/L — ABNORMAL LOW (ref 22–32)
Calcium: 8.7 mg/dL — ABNORMAL LOW (ref 8.9–10.3)
Chloride: 106 mmol/L (ref 98–111)
Creatinine, Ser: 1.41 mg/dL — ABNORMAL HIGH (ref 0.61–1.24)
GFR, Estimated: 47 mL/min — ABNORMAL LOW (ref >60)
Glucose, Bld: 162 mg/dL — ABNORMAL HIGH (ref 70–99)
Potassium: 5 mmol/L (ref 3.5–5.1)
Sodium: 135 mmol/L (ref 135–145)

## 2022-12-16 LAB — CBC WITH DIFFERENTIAL/PLATELET
Abs Immature Granulocytes: 0.68 10*3/uL — ABNORMAL HIGH (ref 0.00–0.07)
Basophils Absolute: 0.1 10*3/uL (ref 0.0–0.1)
Basophils Relative: 1 %
Eosinophils Absolute: 0.3 10*3/uL (ref 0.0–0.5)
Eosinophils Relative: 2 %
HCT: 42.6 % (ref 39.0–52.0)
Hemoglobin: 14.6 g/dL (ref 13.0–17.0)
Immature Granulocytes: 5 %
Lymphocytes Relative: 24 %
Lymphs Abs: 3.2 10*3/uL (ref 0.7–4.0)
MCH: 31.5 pg (ref 26.0–34.0)
MCHC: 34.3 g/dL (ref 30.0–36.0)
MCV: 91.8 fL (ref 80.0–100.0)
Monocytes Absolute: 0.9 10*3/uL (ref 0.1–1.0)
Monocytes Relative: 6 %
Neutro Abs: 8.5 10*3/uL — ABNORMAL HIGH (ref 1.7–7.7)
Neutrophils Relative %: 62 %
Platelets: 284 10*3/uL (ref 150–400)
RBC: 4.64 MIL/uL (ref 4.22–5.81)
RDW: 12.3 % (ref 11.5–15.5)
WBC: 13.6 10*3/uL — ABNORMAL HIGH (ref 4.0–10.5)
nRBC: 0 % (ref 0.0–0.2)

## 2022-12-16 LAB — SURGICAL PATHOLOGY

## 2022-12-16 LAB — PROTIME-INR
INR: 1.7 — ABNORMAL HIGH (ref 0.8–1.2)
Prothrombin Time: 20.4 s — ABNORMAL HIGH (ref 11.4–15.2)

## 2022-12-16 MED ORDER — WARFARIN SODIUM 5 MG PO TABS: ORAL_TABLET | ORAL | 1 refills | Status: DC

## 2022-12-16 MED ORDER — OMEPRAZOLE 40 MG PO CPDR: 40.0000 mg | DELAYED_RELEASE_CAPSULE | Freq: Two times a day (BID) | ORAL | 0 refills | Status: DC

## 2022-12-16 MED ORDER — ASPIRIN 81 MG PO TABS: 81.0000 mg | ORAL_TABLET | Freq: Every evening | ORAL | Status: DC

## 2022-12-16 NOTE — Evaluation (Signed)
Physical Therapy Evaluation Patient Details Name: KHYLEN RIOLO MRN: [id number] DOB: [birth year] Today's Date: 12/16/2022  History of Present Illness  CARSEN LEAF is a 89 y.o. male presents with melena, epigastric pain and nausea. Pt admitted with GI bleeding; s/p upper GI endoscopy 11/4. PMH: ICD, CAD, chronic combined systolic and diastolic heart failure, HTN, type 2 diabetes, CKD 3B, BPH, history of left ventricular thrombus  Clinical Impression  Pt admitted with above diagnosis. Pt reports d/c to daughter's home, 3 steps to enter with bil handrail and no steps once inside. 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 Pt denies dizziness with positional changes and in standing, demo good step through gait pattern with RW. Recommend HHPT and family support while progressing back to baseline. Pt currently with functional limitations due to the deficits listed below (see PT Problem List). Pt will benefit from acute skilled PT to increase their independence and safety with mobility to allow discharge.     BP noted: Supine 96/63 HR 91 Sitting 75/62 HR 104 SpO2 96% on RA Standing 72/61 HR 101 SpO2 96% on RA       If plan is discharge home, recommend the following: A little help with bathing/dressing/bathroom;Help with stairs or ramp for entrance   Can travel by private vehicle   Yes    Equipment Recommendations None recommended by PT  Recommendations for Other Services       Functional Status Assessment Patient has had a recent decline in their functional status and demonstrates the ability to make significant improvements in function in a reasonable and predictable amount of time.     Precautions / Restrictions Precautions Precautions: Fall Precaution Comments: monitor BP Restrictions Weight Bearing Restrictions: No      Mobility  Bed  Mobility Overal bed mobility: Modified Independent                  Transfers Overall transfer level: Modified independent                      Ambulation/Gait Ambulation/Gait assistance: Supervision Gait Distance (Feet): 150 Feet Assistive device: Rolling walker (2 wheels) Gait Pattern/deviations: Step-through pattern, Decreased stride length Gait velocity: slightly decreased     General Gait Details: step through gait pattern using RW, good steadiness wtihout overt LOB, recliner follow in case dizziness onset but did not occur and pt able to complete without seated or standing rest break  Stairs            Wheelchair Mobility     Tilt Bed    Modified Rankin (Stroke Patients Only)       Balance Overall balance assessment: Mild deficits observed, not formally tested                                           Pertinent Vitals/Pain Pain Assessment Pain Assessment: No/denies pain    Home Living Family/patient expects to be discharged to:: Private residence Living Arrangements: Children Available Help at Discharge: Family;Available PRN/intermittently Type of Home: Apartment Home Access: Level entry       Home Layout: One level Home Equipment: Agricultural consultant (2 wheels);Cane - single point;Grab bars - tub/shower Additional Comments: Pt plans to d/c to daughter's home with 3  steps to enter with bil handrails and can reach both, bed/bath on main level    Prior Function Prior Level of Function : Independent/Modified Independent;Patient poor historian/Family not available             Mobility Comments: pt reports using SPC, but does have RW ADLs Comments: pt reports ind with self care     Extremity/Trunk Assessment   Upper Extremity Assessment Upper Extremity Assessment: Defer to OT evaluation    Lower Extremity Assessment Lower Extremity Assessment: Overall WFL for tasks assessed    Cervical / Trunk  Assessment Cervical / Trunk Assessment: Normal  Communication   Communication Communication: No apparent difficulties  Cognition Arousal: Alert Behavior During Therapy: WFL for tasks assessed/performed Overall Cognitive Status: Within Functional Limits for tasks assessed                                          General Comments General comments (skin integrity, edema, etc.): With mobility tech earlier pt reported dizziness when he stood up. Orthostatics with OT/PT: Supine 96/63 HR 91 Sitting 75/62 HR 104 SpO2 96% on RA Standing 72/61 HR 101 SpO2 96% on RA--pt without report of dizziness    Exercises     Assessment/Plan    PT Assessment Patient needs continued PT services  PT Problem List Decreased strength;Decreased activity tolerance;Decreased balance;Decreased knowledge of use of DME;Decreased safety awareness;Obesity       PT Treatment Interventions DME instruction;Gait training;Stair training;Functional mobility training;Therapeutic activities;Therapeutic exercise;Balance training;Patient/family education    PT Goals (Current goals can be found in the Care Plan section)  Acute Rehab PT Goals Patient Stated Goal: get stronger at daughter's house then return home PT Goal Formulation: With patient Time For Goal Achievement: 12/30/22 Potential to Achieve Goals: Good    Frequency Min 1X/week     Co-evaluation PT/OT/SLP Co-Evaluation/Treatment: Yes Reason for Co-Treatment: To address functional/ADL transfers PT goals addressed during session: Mobility/safety with mobility         AM-PAC PT "6 Clicks" Mobility  Outcome Measure Help needed turning from your back to your side while in a flat bed without using bedrails?: None Help needed moving from lying on your back to sitting on the side of a flat bed without using bedrails?: None Help needed moving to and from a bed to a chair (including a wheelchair)?: A Little Help needed standing up from a chair  using your arms (e.g., wheelchair or bedside chair)?: A Little Help needed to walk in hospital room?: A Little Help needed climbing 3-5 steps with a railing? : A Little 6 Click Score: 20    End of Session Equipment Utilized During Treatment: Gait belt Activity Tolerance: Patient tolerated treatment well Patient left: in chair;with call bell/phone within reach Nurse Communication: Mobility status PT Visit Diagnosis: Other abnormalities of gait and mobility (R26.89);History of falling (Z91.81)    Time: 1610-9604 PT Time Calculation (min) (ACUTE ONLY): 24 min   Charges:   PT Evaluation $PT Eval Low Complexity: 1 Low   PT General Charges $$ ACUTE PT VISIT: 1 Visit         Tori Maryiah Olvey PT, DPT 12/16/22, 11:49 AM

## 2022-12-16 NOTE — Progress Notes (Addendum)
[facility] Progress Note  CC:  GI bleed/melena   Subjective: No nausea or vomiting.  He has mild epigastric pain today which did not worsen after eating a regular diet.  No bowel movement/melena overnight or so far today but he feels like he may pass a bowel movement soon.  No chest pain or shortness of breath.  He anticipates going home soon as he was discharged by the hospitalist.   Objective:  Vital signs in last 24 hours: Temp:  [97.8 F (36.6 C)-98 F (36.7 C)] 98 F (36.7 C) (11/05 0418) Pulse Rate:  [37-84] 78 (11/05 1200) Resp:  [11-23] 19 (11/05 0418) BP: (64-145)/(37-92) 100/54 (11/05 1200) SpO2:  [94 %-100 %] 95 % (11/05 0418) Weight:  [96 kg] 96 kg (11/04 1410) Last BM Date : 12/15/22 General: Alert pleasant 89 year old male in no acute distress. Heart: Regular rhythm, no murmur. Pulm: Breath sounds clear throughout. Abdomen: Soft, mild epigastric tenderness without rebound or guarding.  Positive bowel sounds to all 4 quadrants. Extremities:  No edema. Neurologic:  Alert and  oriented x 4. Grossly normal neurologically. Psych:  Alert and cooperative. Normal mood and affect.  Intake/Output from previous day: 11/04 0701 - 11/05 0700 In: 1205 [P.O.:1080; I.V.:125] Out: 151 [Urine:151] Intake/Output this shift: Total I/O In: 240 [P.O.:240] Out: 200 [Urine:200]  Lab Results: Recent Labs    12/14/22 1935 12/15/22 0500 12/16/22 0840  WBC 14.4* 15.2* 13.6*  HGB 14.9 14.6 14.6  HCT 43.3 44.0 42.6  PLT 302 297 284   BMET Recent Labs    12/14/22 1935 12/15/22 0500 12/16/22 0454  NA 134* 136 135  K 4.6 4.5 5.0  CL 103 105 106  CO2 20* 18* 20*  GLUCOSE 187* 159* 162*  BUN 57* 51* 40*  CREATININE 1.37* 1.36* 1.41*  CALCIUM 9.4 9.2 8.7*   LFT Recent Labs    12/14/22 1935  PROT 7.0  ALBUMIN 3.3*  AST 23  ALT 32  ALKPHOS 53  BILITOT 0.3   PT/INR Recent Labs    12/14/22 1935 12/16/22 0454  LABPROT 19.3* 20.4*  INR 1.6*  1.7*   Hepatitis Panel No results for input(s): "HEPBSAG", "HCVAB", "HEPAIGM", "HEPBIGM" in the last 72 hours.  No results found.  Assessment / Plan:  89 year old male with a history of GERD, small hiatal hernia and a duodenal AVM admitted with epigastric pain, melena/suspected upper GI bleed.  Stable Hg 14.9 -> 14.6. FOBT +. Last dose of Warfarin 11/3 am. EGD 11/4 showed an incidental esophageal stricture, hiatal hernia, multiple clean-based gastric and duodenal ulcers.  Biopsies pending. -Our office will contact the patient with EGD biopsy results  -Continue Pantoprazole 40 mg p.o. twice daily for 4 weeks then once daily indefinitely -Restart Warfarin 12/17/2022 -Check CBC with PCP 1 week posthospital discharge  History of a tubular adenomatous polyp per colonoscopy done during hospitalization secondary to hematochezia 01/2022, prep was poor.   Coronary artery disease s/p CABG   Ischemic cardiomyopathy s/p ICD   Combined chronic systolic and diastolic CHF with LVEF 30 to 35%   LV thrombus. Warfarin on hold.  -Restart warfarin 12/17/2022   CKD stge IIb   DM type II   Recently hospitalization with Covid pneumonia treated with Paxlovid, Decadron and supplemental oxygen   Recent hospitalization with UTI treated with antibiotics      Principal Problem:   GI bleeding Active Problems:   Hyperlipidemia   Essential hypertension   Non-insulin dependent type 2 diabetes mellitus (HCC)  Left ventricular thrombus   Acute GI bleeding   Combined congestive systolic and diastolic heart failure (HCC)   BPH (benign prostatic hyperplasia)   Multiple gastric ulcers   Duodenal ulcer   Esophageal stricture     LOS: 0 days   Arnaldo Natal  12/16/2022, 12:55 PM  GI ATTENDING  Interval history data reviewed.  Agree with interval progress note.  Biopsies for H. pylori were negative.  Agree with plans for long-term PPI treatment as outlined above.  No outpatient GI follow-up  required.  Will sign off.  Wilhemina Bonito. Eda Keys., M.D. [facility]

## 2022-12-16 NOTE — Plan of Care (Signed)

## 2022-12-16 NOTE — Discharge Summary (Signed)
Physician Discharge Summary  Joseph Malone QMV:[id number] DOB: 1930-07-17 DOA: 12/14/2022  PCP: Shelva Majestic, MD  Admit date: 12/14/2022 Discharge date: 12/16/2022 30 Day Unplanned Readmission Risk Score    Flowsheet Row ED to Hosp-Admission (Discharged) from 12/05/2022 in [facility]  30 Day Unplanned Readmission Risk Score (%) 23.71 Filed at 12/10/2022 1600       This score is the patient's risk of an unplanned readmission within 30 days of being discharged (0 -100%). The score is based on dignosis, age, lab data, medications, orders, and past utilization.   Low:  0-14.9   Medium: 15-21.9   High: 22-29.9   Extreme: 30 and above          Admitted From: Home Disposition: Home  Recommendations for Outpatient Follow-up:  Follow up with PCP in 1-2 weeks Please obtain BMP/CBC in one week Please follow up with your PCP on the following pending results: Unresulted Labs (From admission, onward)     Start     Ordered   12/16/22 0500  CBC with Differential/Platelet  Tomorrow morning,   R        12/15/22 0904   12/14/22 2334  Occult blood card to lab, stool  Once,   URGENT        12/14/22 2333              Home Health: None Equipment/Devices: None  Discharge Condition: Stable CODE STATUS: Full code Diet recommendation: Cardiac  Subjective: Seen and examined.  He has no complaints.  He is excited to go home.  Brief/Interim Summary: Joseph Malone is a 89 y.o. male with medical history significant of ICD, CAD, chronic combined systolic and diastolic heart failure, hypertension, type 2 diabetes, CKD 3B, BPH, history of left ventricular thrombus on warfarin who presented with melena, epigastric pain and nausea and hematochezia for 3 days. Denies any NSAID or alcohol use.  Took last dose of warfarin on morning of 11/3.  He was recently discharged last week from the hospital with COVID and UTI.   He has history of GI bleed with rectal bleeding and was  hospitalized 01/2022.  He underwent colonoscopy with 1 ascending polyp removed.  Had nonbleeding internal hemorrhoids.  Otherwise no evidence of recent GI bleed active bleeding.   On arrival to the ED, he was afebrile, borderline hypotensive with BP of 94/69 and tachycardia with heart rate of 114.CBC with stable anemia of 14.9, leukocytosis of 14.4.  Lactate of 2.2. FOBT was positive with dark stool on exam per ED physician.  BMP notable for mild hyponatremia of 134, CO2 20, creatinine of 1.34 down from a prior of 1.44. He was started on IV Protonix, admitted under hospitalist service and GI consulted.  Patient eventually underwent EGD by Dr. Fredric Mare on 12/15/2022 and was found to have incidental esophageal stricture, hiatal hernia and multiple superficial gastric ulcers and some duodenal ulcers but none of them was actively bleeding and biopsy was taken.  Patient's hemoglobin has remained stable.  He did not require any blood transfusion.  GI recommended discharging today with recommendations to continue PPI 40 mg p.o. twice daily for 4 weeks followed by once daily indefinitely and resume Coumadin and aspirin in 48 hours from EGD so he will resume both tomorrow.  I have prescribed him 4 weeks of omeprazole, will defer to his PCP to prescribe omeprazole from there on.  Patient understands all these recommendations and he is in agreement with the discharge plan today.  Discharge Diagnoses:  Principal Problem:   GI bleeding Active Problems:   Hyperlipidemia   Essential hypertension   Non-insulin dependent type 2 diabetes mellitus (HCC)   Left ventricular thrombus   Acute GI bleeding   Combined congestive systolic and diastolic heart failure (HCC)   BPH (benign prostatic hyperplasia)   Multiple gastric ulcers   Duodenal ulcer   Esophageal stricture    Discharge Instructions   Allergies as of 12/16/2022       Reactions   Penicillins Rash, Other (See Comments)   Because of a history of  documented adverse serious drug reaction;Medi Alert bracelet  is recommended Has patient had a PCN reaction causing immediate rash, facial/tongue/throat swelling, SOB or lightheadedness with hypotension: No Has patient had a PCN reaction causing severe rash involving mucus membranes or skin necrosis: No Has patient had a PCN reaction that required hospitalization: No Has patient had a PCN reaction occurring within the last 10 years: No If all of the above answers are "NO", t        Medication List     TAKE these medications    aspirin 81 MG tablet Take 1 tablet (81 mg total) by mouth every evening. Start taking on: December 17, 2022   folic acid 400 MCG tablet Commonly known as: FOLVITE Take 400 mcg by mouth daily.   Glucosamine HCl 1500 MG Tabs Take 1,500 mg by mouth every evening.   Melatonin 5 MG Chew Chew 1 tablet by mouth daily as needed (sleep).   metFORMIN 500 MG 24 hr tablet Commonly known as: GLUCOPHAGE-XR TAKE 1 TABLET(500 MG) BY MOUTH DAILY WITH BREAKFAST   multivitamin with minerals Tabs tablet Take 1 tablet by mouth daily.   nitroGLYCERIN 0.4 MG SL tablet Commonly known as: NITROSTAT Place 1 tablet (0.4 mg total) under the tongue every 5 (five) minutes as needed for chest pain. 1 tablet every 5 minutes for 3 dose only   omeprazole 40 MG capsule Commonly known as: PRILOSEC Take 1 capsule (40 mg total) by mouth 2 (two) times daily for 28 days.   simvastatin 40 MG tablet Commonly known as: ZOCOR Take 0.5 tablets (20 mg total) by mouth at bedtime.   spironolactone 25 MG tablet Commonly known as: ALDACTONE TAKE 1/2 TABLET BY MOUTH DAILY What changed:  when to take this reasons to take this   tamsulosin 0.4 MG Caps capsule Commonly known as: FLOMAX Take 0.4 mg by mouth at bedtime.   vitamin B-12 100 MCG tablet Commonly known as: CYANOCOBALAMIN Take 100 mcg by mouth daily.   Vitamin D3 50 MCG (2000 UT) Tabs Take 2,000 Units by mouth daily.    warfarin 5 MG tablet Commonly known as: COUMADIN Take as directed. If you are unsure how to take this medication, talk to your nurse or doctor. Original instructions: TAKE 1 TABLET BY MOUTH ON MONDAY, WEDNESDAY, AND FRIDAY, AND TAKE ONE-HALF TABLET ALL OTHER DAYS OR AS DIRECTED Start taking on: December 17, 2022 What changed:  how much to take how to take this when to take this additional instructions        Follow-up Information     Shelva Majestic, MD Follow up in 1 week(s).   Specialty: Family Medicine Contact information: [address] [place] [address] [phone]                Allergies  Allergen Reactions   Penicillins Rash and Other (See Comments)    Because of a history of documented  adverse serious drug reaction;Medi Alert bracelet  is recommended Has patient had a PCN reaction causing immediate rash, facial/tongue/throat swelling, SOB or lightheadedness with hypotension: No Has patient had a PCN reaction causing severe rash involving mucus membranes or skin necrosis: No Has patient had a PCN reaction that required hospitalization: No Has patient had a PCN reaction occurring within the last 10 years: No If all of the above answers are "NO", t    Consultations: GI   Procedures/Studies: DG CHEST PORT 1 VIEW  Result Date: 12/07/2022 CLINICAL DATA:  Cough COVID EXAM: PORTABLE CHEST 1 VIEW COMPARISON:  12/05/2022, 07/05/2022 FINDINGS: Post sternotomy changes. Left-sided cardiac pacing device as before. Interim development of heterogeneous lower lung airspace opacities. No pleural effusion. Stable cardiomediastinal silhouette. No pneumothorax IMPRESSION: Interim development of heterogeneous lower lung airspace opacities suspicious for pneumonia Electronically Signed   By: Jasmine Pang M.D.   On: 12/07/2022 00:56   DG Chest 2 View  Result Date: 12/05/2022 CLINICAL DATA:  Chest pain.  Shortness of breath. EXAM: CHEST - 2 VIEW COMPARISON:   07/05/2022. FINDINGS: Bilateral lung fields are clear. Bilateral costophrenic angles are clear. Normal cardio-mediastinal silhouette. There is a left sided 2-lead pacemaker. Sternotomy wires noted. No acute osseous abnormalities. The soft tissues are within normal limits. IMPRESSION: *No active cardiopulmonary disease. Electronically Signed   By: Jules Schick M.D.   On: 12/05/2022 15:21     Discharge Exam: Vitals:   12/16/22 0019 12/16/22 0418  BP: (!) 102/55 131/72  Pulse: (!) 44 (!) 57  Resp: 18 19  Temp: 98 F (36.7 C) 98 F (36.7 C)  SpO2: 97% 95%   Vitals:   12/15/22 1551 12/15/22 2024 12/16/22 0019 12/16/22 0418  BP: 127/83 (!) 145/78 (!) 102/55 131/72  Pulse: 73 76 (!) 44 (!) 57  Resp: (!) 22 20 18 19   Temp: 97.8 F (36.6 C) 98 F (36.7 C) 98 F (36.7 C) 98 F (36.7 C)  TempSrc: Oral Oral Oral Oral  SpO2: 100% 97% 97% 95%  Weight:      Height:        General: Pt is alert, awake, not in acute distress Cardiovascular: RRR, S1/S2 +, no rubs, no gallops Respiratory: CTA bilaterally, no wheezing, no rhonchi Abdominal: Soft, NT, ND, bowel sounds + Extremities: no edema, no cyanosis    The results of significant diagnostics from this hospitalization (including imaging, microbiology, ancillary and laboratory) are listed below for reference.     Microbiology: No results found for this or any previous visit (from the past 240 hour(s)).   Labs: BNP (last 3 results) Recent Labs    07/11/22 0941 07/24/22 1501  BNP 201.7* 79.8   Basic Metabolic Panel: Recent Labs  Lab 12/10/22 0629 12/14/22 1935 12/15/22 0500 12/16/22 0454  NA 133* 134* 136 135  K 4.6 4.6 4.5 5.0  CL 107 103 105 106  CO2 20* 20* 18* 20*  GLUCOSE 227* 187* 159* 162*  BUN 42* 57* 51* 40*  CREATININE 1.44* 1.37* 1.36* 1.41*  CALCIUM 8.6* 9.4 9.2 8.7*   Liver Function Tests: Recent Labs  Lab 12/14/22 1935  AST 23  ALT 32  ALKPHOS 53  BILITOT 0.3  PROT 7.0  ALBUMIN 3.3*   Recent  Labs  Lab 12/14/22 1935  LIPASE 94*   No results for input(s): "AMMONIA" in the last 168 hours. CBC: Recent Labs  Lab 12/09/22 1328 12/10/22 0629 12/14/22 1935 12/15/22 0500 12/16/22 0840  WBC  --  14.5* 14.4* 15.2*  13.6*  NEUTROABS  --   --  8.9*  --  8.5*  HGB 14.1 14.0 14.9 14.6 14.6  HCT 41.2 41.4 43.3 44.0 42.6  MCV  --  88.3 90.8 91.9 91.8  PLT  --  217 302 297 284   Cardiac Enzymes: No results for input(s): "CKTOTAL", "CKMB", "CKMBINDEX", "TROPONINI" in the last 168 hours. BNP: Invalid input(s): "POCBNP" CBG: Recent Labs  Lab 12/09/22 1643 12/09/22 2139 12/10/22 0748 12/10/22 1142  GLUCAP 197* 338* 211* 277*   D-Dimer No results for input(s): "DDIMER" in the last 72 hours. Hgb A1c No results for input(s): "HGBA1C" in the last 72 hours. Lipid Profile No results for input(s): "CHOL", "HDL", "LDLCALC", "TRIG", "CHOLHDL", "LDLDIRECT" in the last 72 hours. Thyroid function studies No results for input(s): "TSH", "T4TOTAL", "T3FREE", "THYROIDAB" in the last 72 hours.  Invalid input(s): "FREET3" Anemia work up No results for input(s): "VITAMINB12", "FOLATE", "FERRITIN", "TIBC", "IRON", "RETICCTPCT" in the last 72 hours. Urinalysis    Component Value Date/Time   COLORURINE YELLOW 12/14/2022 2047   APPEARANCEUR CLEAR 12/14/2022 2047   LABSPEC 1.021 12/14/2022 2047   PHURINE 5.0 12/14/2022 2047   GLUCOSEU NEGATIVE 12/14/2022 2047   HGBUR NEGATIVE 12/14/2022 2047   BILIRUBINUR NEGATIVE 12/14/2022 2047   BILIRUBINUR neg 05/17/2020 1017   KETONESUR NEGATIVE 12/14/2022 2047   PROTEINUR NEGATIVE 12/14/2022 2047   UROBILINOGEN 0.2 05/17/2020 1017   UROBILINOGEN 0.2 05/06/2012 1726   NITRITE NEGATIVE 12/14/2022 2047   LEUKOCYTESUR NEGATIVE 12/14/2022 2047   Sepsis Labs Recent Labs  Lab 12/10/22 0629 12/14/22 1935 12/15/22 0500 12/16/22 0840  WBC 14.5* 14.4* 15.2* 13.6*   Microbiology No results found for this or any previous visit (from the past 240  hour(s)).  FURTHER DISCHARGE INSTRUCTIONS:   Get Medicines reviewed and adjusted: Please take all your medications with you for your next visit with your Primary MD   Laboratory/radiological data: Please request your Primary MD to go over all hospital tests and procedure/radiological results at the follow up, please ask your Primary MD to get all Hospital records sent to his/her office.   In some cases, they will be blood work, cultures and biopsy results pending at the time of your discharge. Please request that your primary care M.D. goes through all the records of your hospital data and follows up on these results.   Also Note the following: If you experience worsening of your admission symptoms, develop shortness of breath, life threatening emergency, suicidal or homicidal thoughts you must seek medical attention immediately by calling 911 or calling your MD immediately  if symptoms less severe.   You must read complete instructions/literature along with all the possible adverse reactions/side effects for all the Medicines you take and that have been prescribed to you. Take any new Medicines after you have completely understood and accpet all the possible adverse reactions/side effects.    Do not drive when taking Pain medications or sleeping medications (Benzodaizepines)   Do not take more than prescribed Pain, Sleep and Anxiety Medications. It is not advisable to combine anxiety,sleep and pain medications without talking with your primary care practitioner   Special Instructions: If you have smoked or chewed Tobacco  in the last 2 yrs please stop smoking, stop any regular Alcohol  and or any Recreational drug use.   Wear Seat belts while driving.   Please note: You were cared for by a hospitalist during your hospital stay. Once you are discharged, your primary care physician will handle any further  medical issues. Please note that NO REFILLS for any discharge medications will be  authorized once you are discharged, as it is imperative that you return to your primary care physician (or establish a relationship with a primary care physician if you do not have one) for your post hospital discharge needs so that they can reassess your need for medications and monitor your lab values  Time coordinating discharge: Over 30 minutes  SIGNED:   Hughie Closs, MD  [facility] 12/16/2022, 10:27 AM *Please note that this is a verbal dictation therefore any spelling or grammatical errors are due to the "Dragon [employer] One" system interpretation. If 7PM-7AM, please contact night-coverage [email or web address]

## 2022-12-16 NOTE — Progress Notes (Signed)
Mobility Specialist - Progress Note  Post-mobility: 99 bpm HR, 126/78 mmHg (94 MAP), 95% SPO2   12/16/22 0904  Mobility  Activity Stood at bedside  Level of Assistance Contact guard assist, steadying assist  Assistive Device Front wheel walker  Range of Motion/Exercises Active  Activity Response Tolerated fair  $Mobility charge 1 Mobility  Mobility Specialist Start Time (ACUTE ONLY) [id number]  Mobility Specialist Stop Time (ACUTE ONLY) [id number]  Mobility Specialist Time Calculation (min) (ACUTE ONLY) 9 min   Pt was found in bed and agreeable to ambulate. Once standing stated feeling dizzy and vitals taken and recorded above. Pt stated feeling tired and returned to bed with all needs met. Call bell in reach.  Billey Chang Mobility Specialist

## 2022-12-16 NOTE — TOC Transition Note (Addendum)
Transition of Care [facility]) - CM/SW Discharge Note   Patient Details  Name: Joseph Malone MRN: [id number] Date of Birth: July 22, 1930  Transition of Care [facility]) CM/SW Contact:  Lanier Clam, RN Phone Number: 12/16/2022, 10:59 AM   Clinical Narrative: d/c home w/HHC Beatris Ship aware. HHPT/OT. Address @ d/c:[address] [place] [address]-will stay with dtr.No further CM needs.      Final next level of care: Home w Home Health Services Barriers to Discharge: No Barriers Identified   Patient Goals and CMS Choice CMS Medicare.gov Compare Post Acute Care list provided to:: Patient Choice offered to / list presented to : Patient  Discharge Placement                         Discharge Plan and Services Additional resources added to the After Visit Summary for     Discharge Planning Services: CM Consult Post Acute Care Choice: Home Health                    HH Arranged: PT, OT [facility] Agency: [facility] Date [facility] Agency Contacted: 12/16/22 Time HH Agency Contacted: 1059 Representative spoke with at [facility] Agency: Kandee Keen  Social Determinants of Health (SDOH) Interventions SDOH Screenings   Food Insecurity: No Food Insecurity (12/15/2022)  Housing: Low Risk  (12/15/2022)  Transportation Needs: No Transportation Needs (12/15/2022)  Utilities: Not At Risk (12/15/2022)  Depression (PHQ2-9): Low Risk  (10/16/2022)  Financial Resource Strain: Low Risk  (08/21/2022)  Physical Activity: Insufficiently Active (08/21/2022)  Social Connections: Socially Integrated (08/21/2022)  Stress: No Stress Concern Present (08/21/2022)  Tobacco Use: Medium Risk (12/15/2022)  Health Literacy: Adequate Health Literacy (08/21/2022)     Readmission Risk Interventions     No data to display

## 2022-12-16 NOTE — Care Management Obs Status (Signed)
MEDICARE OBSERVATION STATUS NOTIFICATION   Patient Details  Name: BENINO KORINEK MRN: [id number] Date of Birth: 07-Nov-1930   Medicare Observation Status Notification Given:  Yes    MahabirOlegario Messier, RN 12/16/2022, 11:01 AM

## 2022-12-16 NOTE — Plan of Care (Signed)
  Problem: Coping: Goal: Psychosocial and spiritual needs will be supported Outcome: Progressing   Problem: Respiratory: Goal: Will maintain a patent airway Outcome: Progressing Goal: Complications related to the disease process, condition or treatment will be avoided or minimized Outcome: Progressing   Problem: Clinical Measurements: Goal: Will remain free from infection Outcome: Progressing Goal: Respiratory complications will improve Outcome: Progressing   Problem: Coping: Goal: Level of anxiety will decrease Outcome: Progressing   Problem: Elimination: Goal: Will not experience complications related to bowel motility Outcome: Progressing Goal: Will not experience complications related to urinary retention Outcome: Progressing   Problem: Pain Management: Goal: General experience of comfort will improve Outcome: Progressing   Problem: Safety: Goal: Ability to remain free from injury will improve Outcome: Progressing   Problem: Skin Integrity: Goal: Risk for impaired skin integrity will decrease Outcome: Progressing

## 2022-12-16 NOTE — Evaluation (Signed)
Occupational Therapy Evaluation and Discharge Patient Details Name: Joseph Malone MRN: [id number] DOB: 1930/06/07 Today's Date: 12/16/2022   History of Present Illness Joseph Malone is a 89 y.o. male presents with melena, epigastric pain and nausea. Pt admitted with GI bleeding; s/p upper GI endoscopy 11/4. PMH: ICD, CAD, chronic combined systolic and diastolic heart failure, HTN, type 2 diabetes, CKD 3B, BPH, history of left ventricular thrombus   Clinical Impression   This 89 yo male admitted with above presents to acute OT with PLOF of living on his own, doing his ADLs, some less intense IADLs, and driving. Assist for only deep cleaning of his apartment. He currently is Mod I in room from a RW level for ADLs. He reports he is feeling a little stronger today and that he would normally be using a SPC for ambulation. He reports he will be staying with dtr short term before going back to his own home. No further OT needs, we will sign off.       If plan is discharge home, recommend the following: Assist for transportation;Assistance with cooking/housework    Functional Status Assessment  Patient has not had a recent decline in their functional status  Equipment Recommendations  None recommended by OT       Precautions / Restrictions Precautions Precautions: Fall Precaution Comments: monitor BP Restrictions Weight Bearing Restrictions: No      Mobility Bed Mobility Overal bed mobility: Modified Independent Bed Mobility: Supine to Sit     Supine to sit: Modified independent (Device/Increase time)          Transfers Overall transfer level: Modified independent Equipment used: Rolling walker (2 wheels) Transfers: Sit to/from Stand Sit to Stand: Modified independent (Device/Increase time)           General transfer comment: did require VCs for safe hand placement sit>stand from bed      Balance Overall balance assessment: Mild deficits observed, not formally tested                                          ADL either performed or assessed with clinical judgement   ADL Overall ADL's : Modified independent (at RW level)                                             Vision Baseline Vision/History: 0 No visual deficits              Pertinent Vitals/Pain Pain Assessment Pain Assessment: No/denies pain     Extremity/Trunk Assessment Upper Extremity Assessment Upper Extremity Assessment: Defer to OT evaluation   Lower Extremity Assessment Lower Extremity Assessment: Overall WFL for tasks assessed   Cervical / Trunk Assessment Cervical / Trunk Assessment: Normal   Communication Communication Communication: No apparent difficulties   Cognition Arousal: Alert Behavior During Therapy: WFL for tasks assessed/performed Overall Cognitive Status: Within Functional Limits for tasks assessed                                       General Comments  With mobility tech earlier pt reported dizziness when he stood up. Orthostatics with OT/PT: Supine 96/63 HR 91 Sitting 75/62 HR 104 SpO2  96% on RA Standing 72/61 HR 101 SpO2 96% on RA--pt without report of dizziness            Home Living Family/patient expects to be discharged to:: Private residence Living Arrangements: Children Available Help at Discharge: Family;Available PRN/intermittently Type of Home: Apartment Home Access: Level entry     Home Layout: One level     Bathroom Shower/Tub: Chief Strategy Officer: Handicapped height     Home Equipment: Agricultural consultant (2 wheels);Cane - single point;Grab bars - tub/shower   Additional Comments: Pt plans to d/c to daughter's home with 3 steps to enter with bil handrails and can reach both, bed/bath on main level      Prior Functioning/Environment Prior Level of Function : Independent/Modified Independent;Patient poor historian/Family not available             Mobility  Comments: pt reports using SPC, but does have RW ADLs Comments: pt reports ind with self care        OT Problem List: Impaired balance (sitting and/or standing)         OT Goals(Current goals can be found in the care plan section) Acute Rehab OT Goals Patient Stated Goal: to go home today OT Goal Formulation: With patient         AM-PAC OT "6 Clicks" Daily Activity     Outcome Measure Help from another person eating meals?: None Help from another person taking care of personal grooming?: None Help from another person toileting, which includes using toliet, bedpan, or urinal?: None Help from another person bathing (including washing, rinsing, drying)?: None Help from another person to put on and taking off regular upper body clothing?: None Help from another person to put on and taking off regular lower body clothing?: None 6 Click Score: 24   End of Session Equipment Utilized During Treatment: Gait belt;Rolling walker (2 wheels) Nurse Communication: Mobility status (and BPs per secure chat)  Activity Tolerance: Patient tolerated treatment well Patient left: in chair;with call bell/phone within reach  OT Visit Diagnosis: Muscle weakness (generalized) (M62.81) (using a RW today normally uses a SPC)                Time: 9147-8295 OT Time Calculation (min): 25 min Charges:  OT General Charges $OT Visit: 1 Visit OT Evaluation $OT Eval Moderate Complexity: 1 Mod Joseph L. OT Acute Rehabilitation Services Office [phone]     Joseph Malone 12/16/2022, 11:52 AM

## 2022-12-17 ENCOUNTER — Telehealth: Payer: Self-pay | Admitting: Family Medicine

## 2022-12-17 ENCOUNTER — Encounter (HOSPITAL_COMMUNITY): Payer: Self-pay | Admitting: Internal Medicine

## 2022-12-17 NOTE — Telephone Encounter (Signed)
Caller states patient was admitted to hospital on 11/4 due to GI bleed then released on 11/5. States hospital wanted to ask PCP if patient would benefit from stopping the warfarin since he was recently admitted for the GI bleed. Also pt is due for a hospital f/u within a week. Can patient be worked in? Please Advise.

## 2022-12-17 NOTE — Telephone Encounter (Signed)
I would not want to remove him from the Coumadin with the prior apical thrombus especially without cardiology weighing in  Hopefully we can prevent the bleeds by taking the reflux medication  If we have an 1140 on November 13 can try then but he will likely have to wait

## 2022-12-17 NOTE — Telephone Encounter (Signed)
Can adjust time if needed to 11:15 or 11:30 for coumadin clinic, before pt has OV with PCP.

## 2022-12-17 NOTE — Telephone Encounter (Signed)
Please add pt in the 11:40 slot on 11/13.  Carollee Herter: they would like to know if they can re adjust their coumadin time from 10:00 to closer to 11:15-11:30 on that same day?

## 2022-12-17 NOTE — Telephone Encounter (Signed)
See below

## 2022-12-18 ENCOUNTER — Other Ambulatory Visit: Payer: Self-pay

## 2022-12-18 NOTE — Telephone Encounter (Signed)
Not sure who does her schedule but can someone make the schedule adjustment for his coumadin please?

## 2022-12-18 NOTE — Transitions of Care (Post Inpatient/ED Visit) (Signed)
12/18/2022  Name: Joseph Malone MRN: [id number] DOB: [birth year]  Today's TOC FU Call Status: Today's TOC FU Call Status:: Successful TOC FU Call Completed TOC FU Call Complete Date: 12/18/22 Patient's Name and Date of Birth confirmed.  Transition Care Management Follow-up Telephone Call Date of Discharge: 12/16/22 Discharge Facility: [facility]) Type of Discharge: Inpatient Admission Primary Inpatient Discharge Diagnosis:: "melena" How have you been since you were released from the hospital?: Better (pt voices he is doing better- currently riding in car with daughter-stool slightly still dark but was told it may be like that for a few more days,BM yest, appetite good, hasn't cheked cbg yet today,) Any questions or concerns?: No  Items Reviewed: Did you receive and understand the discharge instructions provided?: Yes Medications obtained,verified, and reconciled?: Yes (Medications Reviewed) Any new allergies since your discharge?: No Dietary orders reviewed?: Yes Type of Diet Ordered:: low salt/heat healthy/carb modified Do you have support at home?: Yes People in Home: alone Name of Support/Comfort Primary Source: daughter [name] lives nearby  Medications Reviewed Today: Medications Reviewed Today     Reviewed by Charlyn Minerva, RN (Registered Nurse) on 12/18/22 at 1246  Med List Status: <None>   Medication Order Taking? Sig Documenting Provider Last Dose Status Informant  aspirin 81 MG tablet [id number] Yes Take 1 tablet (81 mg total) by mouth every evening. Hughie Closs, MD Taking Active   Cholecalciferol (VITAMIN D3) 2000 UNITS TABS [id number] Yes Take 2,000 Units by mouth daily. [provider] Taking Active Multiple Informants  folic acid (FOLVITE) 400 MCG tablet [id number] Yes Take 400 mcg by mouth daily. [provider] Taking Active Multiple Informants  Glucosamine HCl 1500 MG TABS [id number] Yes Take 1,500 mg by mouth every evening.   [provider] Taking Active Multiple Informants  Melatonin 5 MG CHEW [id number] Yes Chew 1 tablet by mouth daily as needed (sleep). [provider] Taking Active Multiple Informants  metFORMIN (GLUCOPHAGE-XR) 500 MG 24 hr tablet [id number] Yes TAKE 1 TABLET(500 MG) BY MOUTH DAILY WITH BREAKFAST Shelva Majestic, MD Taking Active Multiple Informants  Multiple Vitamin (MULTIVITAMIN WITH MINERALS) TABS [id number] Yes Take 1 tablet by mouth daily.  [provider] Taking Active Multiple Informants  nitroGLYCERIN (NITROSTAT) 0.4 MG SL tablet [id number] Yes Place 1 tablet (0.4 mg total) under the tongue every 5 (five) minutes as needed for chest pain. 1 tablet every 5 minutes for 3 dose only [place], [place], [place] Taking Active Multiple Informants  omeprazole (PRILOSEC) 40 MG capsule [id number] Yes Take 1 capsule (40 mg total) by mouth 2 (two) times daily for 28 days. Hughie Closs, MD Taking Active   simvastatin (ZOCOR) 40 MG tablet [id number] Yes Take 0.5 tablets (20 mg total) by mouth at bedtime. Zigmund Daniel., MD Taking Active Multiple Informants  spironolactone (ALDACTONE) 25 MG tablet [id number] Yes TAKE 1/2 TABLET BY MOUTH DAILY  Patient taking differently: Take 12.5 mg by mouth as needed (weight gain if he picks up 3ib overnight.).   Shelva Majestic, MD Taking Active Multiple Informants  tamsulosin [facility]) 0.4 MG CAPS capsule [id number] Yes Take 0.4 mg by mouth at bedtime. [provider] Taking Active Multiple Informants  vitamin B-12 (CYANOCOBALAMIN) 100 MCG tablet [id number] Yes Take 100 mcg by mouth daily. [provider] Taking Active Multiple Informants  warfarin (COUMADIN) 5 MG tablet [id number] Yes TAKE 1 TABLET BY MOUTH ON MONDAY, WEDNESDAY, AND FRIDAY, AND TAKE ONE-HALF TABLET ALL OTHER DAYS OR AS DIRECTED Pahwani, Ravi,  MD Taking Active             Home Care and Equipment/Supplies: Were Home Health Services Ordered?: Yes Name of  Home Health Agency:: [facility] Has Agency set up a time to come to your home?:  (Pt states agency called daughter and will be calling them back to confirm home visit date and time) Any new equipment or medical supplies ordered?: No  Functional Questionnaire: Do you need assistance with bathing/showering or dressing?: No Do you need assistance with meal preparation?: No Do you need assistance with eating?: No Do you have difficulty maintaining continence: No Do you need assistance with getting out of bed/getting out of a chair/moving?: No Do you have difficulty managing or taking your medications?: No  Follow up appointments reviewed: PCP Follow-up appointment confirmed?: Yes Date of PCP follow-up appointment?: 12/24/22 Follow-up Provider: Dr. Durene Cal Specialist [facility] Follow-up appointment confirmed?: Yes Date of Specialist follow-up appointment?: 12/24/22 Follow-Up Specialty Provider:: Coumadin Clinic, pt will discuss with PCP if he needs to f/u with GI MD or not as not listed on d/c instructions Do you need transportation to your follow-up appointment?: No (pt confirms he drives himself or daughter takes him to appts) Do you understand care options if your condition(s) worsen?: Yes-patient verbalized understanding  SDOH Interventions Today    Flowsheet Row Most Recent Value  SDOH Interventions   Food Insecurity Interventions Intervention Not Indicated  Housing Interventions Intervention Not Indicated  Transportation Interventions Intervention Not Indicated  Utilities Interventions Intervention Not Indicated       Goals Addressed             This Visit's Progress    TOC Care Plan       Current Barriers:  Chronic Disease Management support and education needs related to DMII   RNCM Clinical Goal(s):  Patient will work with the Care Management team over the next 30 days to address Transition of Care Barriers: disease mgmt take all medications exactly as prescribed and will  call provider for medication related questions as evidenced by med adherence- compliance with filling pill box weekly attend all scheduled medical appointments:   as evidenced by completion of PCP appt  through collaboration with RN Care manager, provider, and care team.   Interventions: Evaluation of current treatment plan related to  self management and patient's adherence to plan as established by provider  Transitions of Care:  New goal.  Doctor Visits  - discussed the importance of doctor visits Post discharge activity limitations prescribed by provider reviewed Assessed for nutritional status and BMs Reviewed upcoming appts with pt    Diabetes Interventions:  (Status:  New goal.) Short Term Goal Assessed patient's understanding of A1c goal: <7% Provided education to patient about basic DM disease process Reviewed medications with patient and discussed importance of medication adherence Discussed plans with patient for ongoing care management follow up and provided patient with direct contact information for care management team Assessed social determinant of health barriers Lab Results  Component Value Date   HGBA1C 6.1 10/16/2022    Patient Goals/Self-Care Activities: Participate in Transition of Care Program/Attend [facility] scheduled calls Take all medications as prescribed Attend all scheduled provider appointments Perform all self care activities independently  Call provider office for new concerns or questions  check blood sugar at prescribed times: once daily  Follow Up Plan:  Telephone follow up appointment with care management team member scheduled for:  12/25/22-1pm The patient has been provided with contact information for the care management  team and has been advised to call with any health related questions or concerns.          Antionette Fairy, RN,BSN,CCM RN Care Manager Transitions of Care  [facility]  Direct Phone:  [phone] Toll Free: [phone] Fax: [phone]

## 2022-12-19 ENCOUNTER — Telehealth: Payer: Self-pay | Admitting: Family Medicine

## 2022-12-19 DIAGNOSIS — J1282 Pneumonia due to coronavirus disease 2019: Secondary | ICD-10-CM | POA: Diagnosis not present

## 2022-12-19 DIAGNOSIS — B9620 Unspecified Escherichia coli [E. coli] as the cause of diseases classified elsewhere: Secondary | ICD-10-CM | POA: Diagnosis not present

## 2022-12-19 DIAGNOSIS — Z95810 Presence of automatic (implantable) cardiac defibrillator: Secondary | ICD-10-CM | POA: Diagnosis not present

## 2022-12-19 DIAGNOSIS — I5042 Chronic combined systolic (congestive) and diastolic (congestive) heart failure: Secondary | ICD-10-CM | POA: Diagnosis not present

## 2022-12-19 DIAGNOSIS — Z7984 Long term (current) use of oral hypoglycemic drugs: Secondary | ICD-10-CM | POA: Diagnosis not present

## 2022-12-19 DIAGNOSIS — Z7901 Long term (current) use of anticoagulants: Secondary | ICD-10-CM | POA: Diagnosis not present

## 2022-12-19 DIAGNOSIS — E1122 Type 2 diabetes mellitus with diabetic chronic kidney disease: Secondary | ICD-10-CM | POA: Diagnosis not present

## 2022-12-19 DIAGNOSIS — I130 Hypertensive heart and chronic kidney disease with heart failure and stage 1 through stage 4 chronic kidney disease, or unspecified chronic kidney disease: Secondary | ICD-10-CM | POA: Diagnosis not present

## 2022-12-19 DIAGNOSIS — I255 Ischemic cardiomyopathy: Secondary | ICD-10-CM | POA: Diagnosis not present

## 2022-12-19 DIAGNOSIS — Z951 Presence of aortocoronary bypass graft: Secondary | ICD-10-CM | POA: Diagnosis not present

## 2022-12-19 DIAGNOSIS — E785 Hyperlipidemia, unspecified: Secondary | ICD-10-CM | POA: Diagnosis not present

## 2022-12-19 DIAGNOSIS — Z7982 Long term (current) use of aspirin: Secondary | ICD-10-CM | POA: Diagnosis not present

## 2022-12-19 DIAGNOSIS — U071 COVID-19: Secondary | ICD-10-CM | POA: Diagnosis not present

## 2022-12-19 DIAGNOSIS — Z9181 History of falling: Secondary | ICD-10-CM | POA: Diagnosis not present

## 2022-12-19 DIAGNOSIS — N400 Enlarged prostate without lower urinary tract symptoms: Secondary | ICD-10-CM | POA: Diagnosis not present

## 2022-12-19 DIAGNOSIS — N1832 Chronic kidney disease, stage 3b: Secondary | ICD-10-CM | POA: Diagnosis not present

## 2022-12-19 DIAGNOSIS — R911 Solitary pulmonary nodule: Secondary | ICD-10-CM | POA: Diagnosis not present

## 2022-12-19 DIAGNOSIS — N3000 Acute cystitis without hematuria: Secondary | ICD-10-CM | POA: Diagnosis not present

## 2022-12-19 DIAGNOSIS — I513 Intracardiac thrombosis, not elsewhere classified: Secondary | ICD-10-CM | POA: Diagnosis not present

## 2022-12-19 NOTE — Telephone Encounter (Signed)
Returned the call to the number for christine and un known voicemail came on, I left a message for Joseph Malone to call back if in fact that was her voicemail.

## 2022-12-19 NOTE — Telephone Encounter (Signed)
[name] returned call and VO given.

## 2022-12-19 NOTE — Telephone Encounter (Signed)
Home Health Verbal Orders  Agency:  [facility]   Caller: [name]-PT  Contact and title  Ph#  [phone] -  PT Requesting PT/ Home Health Nurse/ OT:    Reason for Request:  PT = Fall prevention, Gait and balance training, Home exercise program, Home Health Nurse: Medication teaching, OT: ADL Training  Frequency: PT= 2 x per week for 3 weeks, then 1 x per week for 5 weeks/ Home Health Nurse and OT= 1 x per week for 1 week  HH needs F2F w/in last 30 days

## 2022-12-23 DIAGNOSIS — N3000 Acute cystitis without hematuria: Secondary | ICD-10-CM | POA: Diagnosis not present

## 2022-12-23 DIAGNOSIS — Z951 Presence of aortocoronary bypass graft: Secondary | ICD-10-CM | POA: Diagnosis not present

## 2022-12-23 DIAGNOSIS — E785 Hyperlipidemia, unspecified: Secondary | ICD-10-CM | POA: Diagnosis not present

## 2022-12-23 DIAGNOSIS — I5042 Chronic combined systolic (congestive) and diastolic (congestive) heart failure: Secondary | ICD-10-CM | POA: Diagnosis not present

## 2022-12-23 DIAGNOSIS — R911 Solitary pulmonary nodule: Secondary | ICD-10-CM | POA: Diagnosis not present

## 2022-12-23 DIAGNOSIS — B9620 Unspecified Escherichia coli [E. coli] as the cause of diseases classified elsewhere: Secondary | ICD-10-CM | POA: Diagnosis not present

## 2022-12-23 DIAGNOSIS — I130 Hypertensive heart and chronic kidney disease with heart failure and stage 1 through stage 4 chronic kidney disease, or unspecified chronic kidney disease: Secondary | ICD-10-CM | POA: Diagnosis not present

## 2022-12-23 DIAGNOSIS — E1122 Type 2 diabetes mellitus with diabetic chronic kidney disease: Secondary | ICD-10-CM | POA: Diagnosis not present

## 2022-12-23 DIAGNOSIS — Z7984 Long term (current) use of oral hypoglycemic drugs: Secondary | ICD-10-CM | POA: Diagnosis not present

## 2022-12-23 DIAGNOSIS — I255 Ischemic cardiomyopathy: Secondary | ICD-10-CM | POA: Diagnosis not present

## 2022-12-23 DIAGNOSIS — J1282 Pneumonia due to coronavirus disease 2019: Secondary | ICD-10-CM | POA: Diagnosis not present

## 2022-12-23 DIAGNOSIS — U071 COVID-19: Secondary | ICD-10-CM | POA: Diagnosis not present

## 2022-12-23 DIAGNOSIS — N1832 Chronic kidney disease, stage 3b: Secondary | ICD-10-CM | POA: Diagnosis not present

## 2022-12-23 DIAGNOSIS — Z95810 Presence of automatic (implantable) cardiac defibrillator: Secondary | ICD-10-CM | POA: Diagnosis not present

## 2022-12-23 DIAGNOSIS — I513 Intracardiac thrombosis, not elsewhere classified: Secondary | ICD-10-CM | POA: Diagnosis not present

## 2022-12-23 DIAGNOSIS — N400 Enlarged prostate without lower urinary tract symptoms: Secondary | ICD-10-CM | POA: Diagnosis not present

## 2022-12-24 ENCOUNTER — Ambulatory Visit (INDEPENDENT_AMBULATORY_CARE_PROVIDER_SITE_OTHER)
Admission: RE | Admit: 2022-12-24 | Discharge: 2022-12-24 | Disposition: A | Discharge status: Home / Self Care | Payer: Medicare Other | Source: Ambulatory Visit | Attending: Family Medicine | Admitting: Family Medicine

## 2022-12-24 ENCOUNTER — Ambulatory Visit: Payer: Medicare Other | Admitting: Family Medicine

## 2022-12-24 ENCOUNTER — Ambulatory Visit (INDEPENDENT_AMBULATORY_CARE_PROVIDER_SITE_OTHER): Payer: Medicare Other

## 2022-12-24 ENCOUNTER — Ambulatory Visit: Payer: Medicare Other

## 2022-12-24 ENCOUNTER — Encounter: Payer: Self-pay | Admitting: Family Medicine

## 2022-12-24 VITALS — BP 114/76 | HR 48 | Temp 97.4°F | Ht 71.0 in | Wt 208.8 lb

## 2022-12-24 DIAGNOSIS — N401 Enlarged prostate with lower urinary tract symptoms: Secondary | ICD-10-CM

## 2022-12-24 DIAGNOSIS — J1282 Pneumonia due to coronavirus disease 2019: Secondary | ICD-10-CM | POA: Diagnosis not present

## 2022-12-24 DIAGNOSIS — Z950 Presence of cardiac pacemaker: Secondary | ICD-10-CM | POA: Diagnosis not present

## 2022-12-24 DIAGNOSIS — U071 COVID-19: Secondary | ICD-10-CM

## 2022-12-24 DIAGNOSIS — E119 Type 2 diabetes mellitus without complications: Secondary | ICD-10-CM

## 2022-12-24 DIAGNOSIS — I10 Essential (primary) hypertension: Secondary | ICD-10-CM

## 2022-12-24 DIAGNOSIS — K259 Gastric ulcer, unspecified as acute or chronic, without hemorrhage or perforation: Secondary | ICD-10-CM | POA: Diagnosis not present

## 2022-12-24 DIAGNOSIS — N3000 Acute cystitis without hematuria: Secondary | ICD-10-CM

## 2022-12-24 DIAGNOSIS — R531 Weakness: Secondary | ICD-10-CM | POA: Diagnosis not present

## 2022-12-24 DIAGNOSIS — E785 Hyperlipidemia, unspecified: Secondary | ICD-10-CM

## 2022-12-24 DIAGNOSIS — Z7901 Long term (current) use of anticoagulants: Secondary | ICD-10-CM

## 2022-12-24 DIAGNOSIS — R351 Nocturia: Secondary | ICD-10-CM

## 2022-12-24 DIAGNOSIS — I2510 Atherosclerotic heart disease of native coronary artery without angina pectoris: Secondary | ICD-10-CM

## 2022-12-24 DIAGNOSIS — I5022 Chronic systolic (congestive) heart failure: Secondary | ICD-10-CM

## 2022-12-24 DIAGNOSIS — N1832 Chronic kidney disease, stage 3b: Secondary | ICD-10-CM

## 2022-12-24 DIAGNOSIS — Z7984 Long term (current) use of oral hypoglycemic drugs: Secondary | ICD-10-CM

## 2022-12-24 LAB — CBC WITH DIFFERENTIAL/PLATELET
Basophils Absolute: 0.1 10*3/uL (ref 0.0–0.1)
Basophils Relative: 1.7 % (ref 0.0–3.0)
Eosinophils Absolute: 0.1 10*3/uL (ref 0.0–0.7)
Eosinophils Relative: 2 % (ref 0.0–5.0)
HCT: 41.2 % (ref 39.0–52.0)
Hemoglobin: 13.8 g/dL (ref 13.0–17.0)
Lymphocytes Relative: 27.4 % (ref 12.0–46.0)
Lymphs Abs: 2 10*3/uL (ref 0.7–4.0)
MCHC: 33.5 g/dL (ref 30.0–36.0)
MCV: 93.1 fL (ref 78.0–100.0)
Monocytes Absolute: 0.6 10*3/uL (ref 0.1–1.0)
Monocytes Relative: 8.3 % (ref 3.0–12.0)
Neutro Abs: 4.3 10*3/uL (ref 1.4–7.7)
Neutrophils Relative %: 60.6 % (ref 43.0–77.0)
Platelets: 157 10*3/uL (ref 150.0–400.0)
RBC: 4.43 Mil/uL (ref 4.22–5.81)
RDW: 13.5 % (ref 11.5–15.5)
WBC: 7.1 10*3/uL (ref 4.0–10.5)

## 2022-12-24 LAB — COMPREHENSIVE METABOLIC PANEL WITH GFR
ALT: 19 U/L (ref 0–53)
AST: 19 U/L (ref 0–37)
Albumin: 3.8 g/dL (ref 3.5–5.2)
Alkaline Phosphatase: 65 U/L (ref 39–117)
BUN: 23 mg/dL (ref 6–23)
CO2: 24 meq/L (ref 19–32)
Calcium: 8.8 mg/dL (ref 8.4–10.5)
Chloride: 107 meq/L (ref 96–112)
Creatinine, Ser: 1.29 mg/dL (ref 0.40–1.50)
GFR: 48.69 mL/min — ABNORMAL LOW (ref >60.00)
Glucose, Bld: 140 mg/dL — ABNORMAL HIGH (ref 70–99)
Potassium: 4.1 meq/L (ref 3.5–5.1)
Sodium: 139 meq/L (ref 135–145)
Total Bilirubin: 0.4 mg/dL (ref 0.2–1.2)
Total Protein: 6.2 g/dL (ref 6.0–8.3)

## 2022-12-24 LAB — POCT INR: INR: 2.8 (ref 2.0–3.0)

## 2022-12-24 MED ORDER — OMEPRAZOLE 40 MG PO CPDR: 40.0000 mg | DELAYED_RELEASE_CAPSULE | Freq: Every day | ORAL | 3 refills | Status: DC

## 2022-12-24 NOTE — Assessment & Plan Note (Signed)
Remains controlled with spironolactone 12.5 mg daily-he holds if blood pressure gets lower than 111 systolic and I think that is reasonable

## 2022-12-24 NOTE — Assessment & Plan Note (Signed)
This appears to have resolved but with urinary frequency he really is!  It is truly a calling for help.  That is great news that she potentially can get off insulin sounds like you have had an incredibly productive 24 hours-great job on!

## 2022-12-24 NOTE — Assessment & Plan Note (Signed)
Patient appears to have recovered well from recent hospitalization with COVID-19.  He is gaining strength with physical therapy-we have been coordinating with home health physical therapy and will continue to do so

## 2022-12-24 NOTE — Progress Notes (Addendum)
Phone [phone]   Subjective:  Joseph Malone is a 89 y.o. year old very pleasant male patient who presents for transitional care management and hospital follow up for melena/GI bleed. Patient was hospitalized from 12/05/2022 to 12/10/2022 then repeat hospitalization 12/14/2022 to 12/16/2022. A TCM phone call was completed on 12/18/2022. Medical complexity moderate  Patient initially presented to the hospital with generalized weakness and UTI symptoms.  Initially thought to be UTI related and treated with ceftriaxone-final culture showed [name] and he then finished a 7-day course of cefadroxil.  Later found to have COVID-19 with possible COVID-19 pneumonia on imaging.  He was treated with Paxlovid and Decadron and given supplemental oxygen.  Fortunately he was able to be weaned off of oxygen before discharge -They did recommend follow-up chest x-ray 2 to 3 weeks after discharge -Chronic medical conditions were managed with continuation of home medications other than holding simvastatin while on Paxlovid  Unfortunately patient developed melena, epigastric pain, nausea, hematochezia for 3 days and ended up back in the hospital on November 3.  History of GI bleed with hospitalization December 2023-at that time had colonoscopy with 1 ascending polyp removed and noted nonbleeding internal hemorrhoids but otherwise no evidence of source of GI bleed. -He was initially borderline hypotensive to 94/69.  CBC was stable with hemoglobin of 14.9 but did have leukocytosis of 14.4 thousand and lactate of 2.2.  Fecal occult blood test was positive for blood.  Had mild hyponatremia.  He was started on IV Protonix and admitted to the hospital.  Underwent EGD by Dr. Fredric Mare of GI on 12/15/2022 and was found to have incidental esophageal stricture, hiatal hernia, multiple superficial gastric ulcers and some duodenal ulcers none actively bleeding-biopsies were taken (benign and no h pylori).  Hemoglobin remained stable  fortunately and he did not require blood transfusion.  Patient was discharged on high-dose proton pump inhibitor 40 mg twice daily for 4 weeks followed by once daily indefinitely with plan to resume Coumadin 48 hours after EGD.  He was only given the initial 4 weeks of omeprazole with plan for PCP to prescribe ongoing Prescription   -INR was therapeutic today at 2.8. no blood in stool or melena (perhaps mildly darker today but not melena obviously). Perhaps faint abdominal pain -as far as urinary tract infection- No burning or frequency with urination- does take his Flomax at night and ends up getting up at night. He may end up trying in the daytime. With frequency- he's ok with [place] checking urine culture -SHORTNESS OF BREATH with COVID- getting better with physical therapy at home. I have singed/ coordinated home orders. Cough has improved. Still having weakness and lost 10 lbs in hospital- hoping to improve this -with stricture issues does report occasional trouble swallowing- has to drink lots of fluids with food -he does report holding spironolactone if blood pressure under 111. Half tablet if above this - sugars up some  at home since getting home as high as 227 but they did give him steroids the first hospitalization- has come back down into 140's fasting - blood pressure variable at home from 106 to 142 with systolic mainly 70s average   See problem oriented charting as well  Past Medical History-  Patient Active Problem List   Diagnosis Date Noted   Multiple gastric ulcers 12/15/2022    Priority: High   GI bleeding 01/13/2022    Priority: High   Left ventricular thrombus 01/13/2022    Priority: High   Chronic systolic  heart failure (HCC) 12/09/2010    Priority: High   Ischemic cardiomyopathy 10/02/2009    Priority: High   ICD (implantable cardioverter-defibrillator) in place 01/16/2009    Priority: High   Non-insulin dependent type 2 diabetes mellitus (HCC) 09/14/2008    Priority:  High   CAD in native artery 09/25/2006    Priority: High   Esophageal stricture 12/15/2022    Priority: Medium    CKD stage 3b, GFR 30-44 ml/min (HCC) 12/05/2022    Priority: Medium    Atherosclerosis of aorta (HCC) 05/18/2020    Priority: Medium    Nephrolithiasis 03/24/2016    Priority: Medium    BPH without obstruction/lower urinary tract symptoms 10/22/2007    Priority: Medium    Hyperlipidemia 04/22/2007    Priority: Medium    Essential hypertension 09/25/2006    Priority: Medium    Long term (current) use of anticoagulants 07/08/2017    Priority: Low   Erectile dysfunction 01/19/2014    Priority: Low   Malignant basal cell neoplasm of skin 09/29/2012    Priority: Low   Solitary pulmonary nodule 07/12/2012    Priority: Low   Osteoarthritis 09/25/2006    Priority: Low   Hematuria 09/25/2006    Priority: Low   Melena 12/16/2022   Duodenal ulcer 12/15/2022   COVID-19 virus infection 12/07/2022   Acute cystitis 12/05/2022   Generalized weakness 12/05/2022   Polyp of ascending colon 01/16/2022   Acute GI bleeding 01/14/2022   PVC's (premature ventricular contractions) 01/24/2020   Hematochezia     Medications- reviewed and updated  A medical reconciliation was performed comparing current medicines to hospital discharge medications. Current Outpatient Medications  Medication Sig Dispense Refill   aspirin 81 MG tablet Take 1 tablet (81 mg total) by mouth every evening.     Cholecalciferol (VITAMIN D3) 2000 UNITS TABS Take 2,000 Units by mouth daily.     folic acid (FOLVITE) 400 MCG tablet Take 400 mcg by mouth daily.     Glucosamine HCl 1500 MG TABS Take 1,500 mg by mouth every evening.      Melatonin 5 MG CHEW Chew 1 tablet by mouth daily as needed (sleep).     metFORMIN (GLUCOPHAGE-XR) 500 MG 24 hr tablet TAKE 1 TABLET(500 MG) BY MOUTH DAILY WITH BREAKFAST 90 tablet 3   Multiple Vitamin (MULTIVITAMIN WITH MINERALS) TABS Take 1 tablet by mouth daily.       nitroGLYCERIN (NITROSTAT) 0.4 MG SL tablet Place 1 tablet (0.4 mg total) under the tongue every 5 (five) minutes as needed for chest pain. 1 tablet every 5 minutes for 3 dose only 90 tablet 3   omeprazole (PRILOSEC) 40 MG capsule Take 1 capsule (40 mg total) by mouth 2 (two) times daily for 28 days. 56 capsule 0   simvastatin (ZOCOR) 40 MG tablet Take 0.5 tablets (20 mg total) by mouth at bedtime.     spironolactone (ALDACTONE) 25 MG tablet TAKE 1/2 TABLET BY MOUTH DAILY (Patient taking differently: Take 12.5 mg by mouth as needed (weight gain if he picks up 3ib overnight.).) 90 tablet 3   tamsulosin (FLOMAX) 0.4 MG CAPS capsule Take 0.4 mg by mouth at bedtime.     vitamin B-12 (CYANOCOBALAMIN) 100 MCG tablet Take 100 mcg by mouth daily.     warfarin (COUMADIN) 5 MG tablet TAKE 1 TABLET BY MOUTH ON MONDAY, WEDNESDAY, AND FRIDAY, AND TAKE ONE-HALF TABLET ALL OTHER DAYS OR AS DIRECTED 100 tablet 1   No current facility-administered medications for this  visit.   Objective  Objective:  BP 114/76 (BP Location: Left Arm)   Pulse (!) 48   Temp (!) 97.4 F (36.3 C)   Ht 5\' 11"  (1.803 m)   Wt 208 lb 12.8 oz (94.7 kg)   SpO2 98%   BMI 29.12 kg/m  Gen: NAD, resting comfortably, very well appearing in light of recent hospitalizations CV: Mildly bradycardic-no murmurs rubs or gallops Lungs: CTAB no crackles, wheeze, rhonchi Abdomen: soft/nontender/nondistended/normal bowel sounds. No rebound or guarding.  Ext: trace edema Skin: warm, dry Neuro: grossly normal, moves all extremities   Assessment and Plan:   Assessment & Plan COVID-19 virus infection Patient appears to have recovered well from recent hospitalization with COVID-19.  He is gaining strength with physical therapy-we have been coordinating with home health physical therapy and will continue to do so Pneumonia due to COVID-19 virus I independently reviewed x-ray from 12/07/2022.  Airway normal.  No bony abnormalities.  Normal cardiac  silhouette.  Bilateral lower lung opacifications concerning for pneumonia.  Appear new compared to x-ray from 12/05/2022.  Normal diaphragm.  These findings were concerning for COVID 19 associated pneumonia.  Hospital team requested follow-up in 2 to 3 weeks-hoping improved but would not be surprised by some persistence Acute cystitis without hematuria This appears to have resolved but with urinary frequency he really is!  It is truly a calling for help.  That is great news that she potentially can get off insulin sounds like you have had an incredibly productive 24 hours-great job on! Multiple gastric ulcers This was the primary reason for several hospitalization leading to GI bleed.  He has had no further melena or bright red blood per rectum.  Hemoglobin was stable before hospitalization but we will recheck this today.  He is currently on high-dose omeprazole 40 mg twice daily for 1 month-I am going to send in an ongoing prescription for 40 mg daily indefinitely.  With stable H&H and no further bleeding and plan for indefinite PPI I do not think we have to schedule GI follow-up at this time Generalized weakness Likely related to 2 hospitalizations but appears to be improving with PT-continue with PT CAD in native artery Denies chest pain.  Shortness of breath with COVID is improving.  Largely asymptomatic/controlled-continue simvastatin 20 mg and aspirin 81 mg Chronic systolic heart failure (HCC) Remains controlled with spironolactone 12.5 mg daily-he holds if blood pressure gets lower than 111 systolic and I think that is reasonable Non-insulin dependent type 2 diabetes mellitus (HCC) Has been well-controlled with metformin 500 mg extended release once daily-continue current medication CKD stage 3b, GFR 30-44 ml/min (HCC) Stable during hospitalization with most recent GFR of 47-we will update this today Essential hypertension Well-controlled on spironolactone 25 mg-takes half tablet as long as  blood pressure over 1 10 in the morning Hyperlipidemia, unspecified hyperlipidemia type Well-controlled recently with LDL under 70-continue current medication Benign prostatic hyperplasia with nocturia He reports symptoms have improved on tamsulosin but after he takes it at night he seems to have more nocturia up to 3 times a night-he wants to try this in the morning.  I also want to rule out UTI given recent infection   Recommended follow up: Return for next already scheduled visit or sooner if needed. Future Appointments  Date Time Provider Department Center  12/25/2022  1:00 PM Florance, Adrienne Mocha, RN [facility] None  01/21/2023 10:30 AM [facility]IN [facility]  01/30/2023  3:35 PM [facility] DEVICE REMOTES [facility]  02/17/2023 11:00 AM Shelva Majestic, MD [facility]  05/01/2023  3:35 PM [facility] DEVICE REMOTES [facility]  07/31/2023  3:35 PM [facility] DEVICE REMOTES [facility]  08/27/2023  3:00 PM [facility] ANNUAL WELLNESS VISIT 1 [facility]    Lab/Order associations:   ICD-10-CM   1. COVID-19 virus infection  U07.1     2. Acute cystitis without hematuria  N30.00 Urine Culture    3. Multiple gastric ulcers  K25.9 Comprehensive metabolic panel    CBC with Differential/Platelet    4. Generalized weakness  R53.1     5. CAD in native artery  I25.10     6. Chronic systolic heart failure (HCC)  Z61.09     7. Non-insulin dependent type 2 diabetes mellitus (HCC)  E11.9     8. Benign prostatic hyperplasia, unspecified whether lower urinary tract symptoms present  N40.0     9. CKD stage 3b, GFR 30-44 ml/min (HCC)  N18.32     10. Essential hypertension  I10     11. Hyperlipidemia, unspecified hyperlipidemia type  E78.5     12. BPH without obstruction/lower urinary tract symptoms  N40.0     13. Pneumonia due to COVID-19 virus  U07.1 DG Chest 2 View   J12.82       Meds ordered this encounter  Medications    omeprazole (PRILOSEC) 40 MG capsule    Sig: Take 1 capsule (40 mg total) by mouth daily.    Dispense:  90 capsule    Refill:  3    Return precautions advised.  Tana Conch, MD

## 2022-12-24 NOTE — Assessment & Plan Note (Signed)
Well-controlled on spironolactone 25 mg-takes half tablet as long as blood pressure over 1 10 in the morning

## 2022-12-24 NOTE — Assessment & Plan Note (Signed)
Has been well-controlled with metformin 500 mg extended release once daily-continue current medication

## 2022-12-24 NOTE — Assessment & Plan Note (Signed)
Denies chest pain.  Shortness of breath with COVID is improving.  Largely asymptomatic/controlled-continue simvastatin 20 mg and aspirin 81 mg

## 2022-12-24 NOTE — Assessment & Plan Note (Signed)
This was the primary reason for several hospitalization leading to GI bleed.  He has had no further melena or bright red blood per rectum.  Hemoglobin was stable before hospitalization but we will recheck this today.  He is currently on high-dose omeprazole 40 mg twice daily for 1 month-I am going to send in an ongoing prescription for 40 mg daily indefinitely.  With stable H&H and no further bleeding and plan for indefinite PPI I do not think we have to schedule GI follow-up at this time

## 2022-12-24 NOTE — Progress Notes (Signed)
I have reviewed and agree with note, evaluation, plan.   Jeffory Snelgrove, MD  

## 2022-12-24 NOTE — Patient Instructions (Addendum)
Pre visit review using our clinic review tool, if applicable. No additional management support is needed unless otherwise documented below in the visit note.  Continue 1 tablet daily except 1/2 tablet on Mondays. Recheck in 4 weeks.

## 2022-12-24 NOTE — Assessment & Plan Note (Signed)
Well-controlled recently with LDL under 70-continue current medication

## 2022-12-24 NOTE — Assessment & Plan Note (Signed)
Stable during hospitalization with most recent GFR of 47-we will update this today

## 2022-12-24 NOTE — Patient Instructions (Addendum)
Please stop by lab before you go If you have mychart- we will send your results within 3 business days of [place] receiving them.  If you do not have mychart- we will call you about results within 5 business days of [place] receiving them.  *please also note that you will see labs on mychart as soon as they post. I will later go in and write notes on them- will say "notes from Dr. Durene Cal"   Keep working with physical therapy to get stronger. Let me know if any recurrent blood in stool or dark black stool or worsening urinary symptoms or worsening breathing  Please go to [facility] (updated 04/07/2019) - located [address] across the [address] from [place] - in the basement - Hours: 8:30-5:00 PM M-F (with lunch from 12:30- 1 PM). You do NOT need an appointment.    Recommended follow up: Return for next already scheduled visit or sooner if needed.

## 2022-12-24 NOTE — Assessment & Plan Note (Signed)
He reports symptoms have improved on tamsulosin but after he takes it at night he seems to have more nocturia up to 3 times a night-he wants to try this in the morning.  I also want to rule out UTI given recent infection

## 2022-12-24 NOTE — Progress Notes (Signed)
Pt also has hospital f/u with PCP today. Pt hospitalized on 10/25 for COVID and hospitalized on 11/3 for melena.Pt denies any current bleeding and reports he is doing much better since going home. Pt started omeprazole in the hospital and is continuing it.   Continue 1 tablet daily except 1/2 tablet on Mondays. Recheck in 4 weeks.

## 2022-12-24 NOTE — Assessment & Plan Note (Signed)
Likely related to 2 hospitalizations but appears to be improving with PT-continue with PT

## 2022-12-25 ENCOUNTER — Other Ambulatory Visit: Payer: Self-pay

## 2022-12-25 DIAGNOSIS — N3000 Acute cystitis without hematuria: Secondary | ICD-10-CM | POA: Diagnosis not present

## 2022-12-25 DIAGNOSIS — N400 Enlarged prostate without lower urinary tract symptoms: Secondary | ICD-10-CM | POA: Diagnosis not present

## 2022-12-25 DIAGNOSIS — I130 Hypertensive heart and chronic kidney disease with heart failure and stage 1 through stage 4 chronic kidney disease, or unspecified chronic kidney disease: Secondary | ICD-10-CM | POA: Diagnosis not present

## 2022-12-25 DIAGNOSIS — I255 Ischemic cardiomyopathy: Secondary | ICD-10-CM | POA: Diagnosis not present

## 2022-12-25 DIAGNOSIS — U071 COVID-19: Secondary | ICD-10-CM | POA: Diagnosis not present

## 2022-12-25 DIAGNOSIS — J1282 Pneumonia due to coronavirus disease 2019: Secondary | ICD-10-CM | POA: Diagnosis not present

## 2022-12-25 DIAGNOSIS — I513 Intracardiac thrombosis, not elsewhere classified: Secondary | ICD-10-CM | POA: Diagnosis not present

## 2022-12-25 DIAGNOSIS — Z7984 Long term (current) use of oral hypoglycemic drugs: Secondary | ICD-10-CM | POA: Diagnosis not present

## 2022-12-25 DIAGNOSIS — B9620 Unspecified Escherichia coli [E. coli] as the cause of diseases classified elsewhere: Secondary | ICD-10-CM | POA: Diagnosis not present

## 2022-12-25 DIAGNOSIS — R911 Solitary pulmonary nodule: Secondary | ICD-10-CM | POA: Diagnosis not present

## 2022-12-25 DIAGNOSIS — E785 Hyperlipidemia, unspecified: Secondary | ICD-10-CM | POA: Diagnosis not present

## 2022-12-25 DIAGNOSIS — Z951 Presence of aortocoronary bypass graft: Secondary | ICD-10-CM | POA: Diagnosis not present

## 2022-12-25 DIAGNOSIS — E1122 Type 2 diabetes mellitus with diabetic chronic kidney disease: Secondary | ICD-10-CM | POA: Diagnosis not present

## 2022-12-25 DIAGNOSIS — N1832 Chronic kidney disease, stage 3b: Secondary | ICD-10-CM | POA: Diagnosis not present

## 2022-12-25 DIAGNOSIS — Z95810 Presence of automatic (implantable) cardiac defibrillator: Secondary | ICD-10-CM | POA: Diagnosis not present

## 2022-12-25 DIAGNOSIS — I5042 Chronic combined systolic (congestive) and diastolic (congestive) heart failure: Secondary | ICD-10-CM | POA: Diagnosis not present

## 2022-12-25 LAB — URINE CULTURE
MICRO NUMBER:: 15725464
Result:: NO GROWTH
SPECIMEN QUALITY:: ADEQUATE

## 2022-12-25 NOTE — Patient Outreach (Signed)
  Care Management  Transitions of Care Program Transitions of Care Post-discharge week 2   12/25/2022 Name: Joseph Malone MRN: [id number] DOB: 1931/02/01  Subjective: Joseph Malone is a 89 y.o. year old male who is a primary care patient of Shelva Majestic, MD. The Care Management team  Engaged with patient by telephone to assess and address transitions of care needs.   Consent to Services:  Patient was given information about care management services, agreed to services, and gave verbal consent to participate.   Assessment:     Patient voices things going well. He has had a good week. He continues to work with therapy and making progress. He completed PCP appt yesterday. He is awaiting test results. Denies any RN CM needs or concerns.        SDOH Interventions    Flowsheet Row Patient Outreach from 12/18/2022 in [place] POPULATION HEALTH DEPARTMENT Telephone from 12/11/2022 in [place] POPULATION HEALTH DEPARTMENT Office Visit from 10/16/2022 in [facility] Clinical Support from 08/21/2022 in [place] [facility] [employer] from 07/14/2022 in [facility] Care Coordination Telephone from 01/20/2022 in [facility] Care Coordination  SDOH Interventions        Food Insecurity Interventions Intervention Not Indicated Intervention Not Indicated -- Intervention Not Indicated Intervention Not Indicated Intervention Not Indicated  Housing Interventions Intervention Not Indicated Intervention Not Indicated -- Intervention Not Indicated -- --  Transportation Interventions Intervention Not Indicated Intervention Not Indicated -- Intervention Not Indicated Intervention Not Indicated Intervention Not Indicated  Utilities Interventions Intervention Not Indicated Intervention Not Indicated -- Intervention Not Indicated -- --  Depression Interventions/Treatment  -- -- PHQ2-9 Score <4 Follow-up Not Indicated -- -- --   Financial Strain Interventions -- -- -- Intervention Not Indicated -- --  Physical Activity Interventions -- -- -- Intervention Not Indicated -- --  Stress Interventions -- -- -- Intervention Not Indicated -- --  Social Connections Interventions -- -- -- Intervention Not Indicated -- --  Health Literacy Interventions -- -- -- Intervention Not Indicated -- --        Goals Addressed   None     Plan: The patient has been provided with contact information for the care management team and has been advised to call with any health related questions or concerns.  Follow up appt scheduled with pt for 01/01/23-1:30 pm   Antionette Fairy, RN,BSN,CCM RN Care Manager Transitions of Care  [place]  Direct Phone: [phone] Toll Free: [phone] Fax: [phone]

## 2022-12-25 NOTE — Patient Instructions (Signed)
Visit Information  Thank you for taking time to visit with me today. Please don't hesitate to contact me if I can be of assistance to you before our next scheduled telephone appointment.  Our next appointment is by telephone on 01/01/23 at 1:30pm  Following is a copy of your care plan:   Goals Addressed   None     The patient verbalized understanding of instructions, educational materials, and care plan provided today and DECLINED offer to receive copy of patient instructions, educational materials, and care plan.   The patient has been provided with contact information for the care management team and has been advised to call with any health related questions or concerns.   Please call the care guide team at [phone] if you need to cancel or reschedule your appointment.   Please call the Suicide and Crisis Lifeline: 988 call the [place] National Suicide Prevention Lifeline: 989 274 3817 or TTY: (702)435-2972 TTY 346 015 7050) to talk to a trained counselor if you are experiencing a Mental Health or Behavioral Health Crisis or need someone to talk to.  Antionette Fairy, RN,BSN,CCM RN Care Manager Transitions of Care  [place]Population [facility]  Direct Phone: [phone] Toll Free: [phone] Fax: [phone]

## 2022-12-29 ENCOUNTER — Telehealth: Payer: Self-pay | Admitting: Family Medicine

## 2022-12-29 NOTE — Telephone Encounter (Signed)
Document Home Health Certificate (Order ID [id number]), to be filled out by provider. Patient requested to send it back via Fax within 5-days. Document is located in providers tray at front office.Please advise

## 2022-12-30 DIAGNOSIS — N1832 Chronic kidney disease, stage 3b: Secondary | ICD-10-CM | POA: Diagnosis not present

## 2022-12-30 DIAGNOSIS — R911 Solitary pulmonary nodule: Secondary | ICD-10-CM | POA: Diagnosis not present

## 2022-12-30 DIAGNOSIS — I513 Intracardiac thrombosis, not elsewhere classified: Secondary | ICD-10-CM | POA: Diagnosis not present

## 2022-12-30 DIAGNOSIS — I130 Hypertensive heart and chronic kidney disease with heart failure and stage 1 through stage 4 chronic kidney disease, or unspecified chronic kidney disease: Secondary | ICD-10-CM | POA: Diagnosis not present

## 2022-12-30 DIAGNOSIS — E785 Hyperlipidemia, unspecified: Secondary | ICD-10-CM | POA: Diagnosis not present

## 2022-12-30 DIAGNOSIS — U071 COVID-19: Secondary | ICD-10-CM | POA: Diagnosis not present

## 2022-12-30 DIAGNOSIS — J1282 Pneumonia due to coronavirus disease 2019: Secondary | ICD-10-CM | POA: Diagnosis not present

## 2022-12-30 DIAGNOSIS — I255 Ischemic cardiomyopathy: Secondary | ICD-10-CM | POA: Diagnosis not present

## 2022-12-30 DIAGNOSIS — Z95810 Presence of automatic (implantable) cardiac defibrillator: Secondary | ICD-10-CM | POA: Diagnosis not present

## 2022-12-30 DIAGNOSIS — B9620 Unspecified Escherichia coli [E. coli] as the cause of diseases classified elsewhere: Secondary | ICD-10-CM | POA: Diagnosis not present

## 2022-12-30 DIAGNOSIS — N3000 Acute cystitis without hematuria: Secondary | ICD-10-CM | POA: Diagnosis not present

## 2022-12-30 DIAGNOSIS — Z951 Presence of aortocoronary bypass graft: Secondary | ICD-10-CM | POA: Diagnosis not present

## 2022-12-30 DIAGNOSIS — E1122 Type 2 diabetes mellitus with diabetic chronic kidney disease: Secondary | ICD-10-CM | POA: Diagnosis not present

## 2022-12-30 DIAGNOSIS — I5042 Chronic combined systolic (congestive) and diastolic (congestive) heart failure: Secondary | ICD-10-CM | POA: Diagnosis not present

## 2022-12-30 DIAGNOSIS — N400 Enlarged prostate without lower urinary tract symptoms: Secondary | ICD-10-CM | POA: Diagnosis not present

## 2022-12-30 DIAGNOSIS — Z7984 Long term (current) use of oral hypoglycemic drugs: Secondary | ICD-10-CM | POA: Diagnosis not present

## 2022-12-30 NOTE — Telephone Encounter (Signed)
Form completed and faxed back

## 2023-01-01 ENCOUNTER — Other Ambulatory Visit: Payer: Self-pay

## 2023-01-01 DIAGNOSIS — N400 Enlarged prostate without lower urinary tract symptoms: Secondary | ICD-10-CM | POA: Diagnosis not present

## 2023-01-01 DIAGNOSIS — Z7984 Long term (current) use of oral hypoglycemic drugs: Secondary | ICD-10-CM | POA: Diagnosis not present

## 2023-01-01 DIAGNOSIS — E785 Hyperlipidemia, unspecified: Secondary | ICD-10-CM | POA: Diagnosis not present

## 2023-01-01 DIAGNOSIS — R911 Solitary pulmonary nodule: Secondary | ICD-10-CM | POA: Diagnosis not present

## 2023-01-01 DIAGNOSIS — I130 Hypertensive heart and chronic kidney disease with heart failure and stage 1 through stage 4 chronic kidney disease, or unspecified chronic kidney disease: Secondary | ICD-10-CM | POA: Diagnosis not present

## 2023-01-01 DIAGNOSIS — B9620 Unspecified Escherichia coli [E. coli] as the cause of diseases classified elsewhere: Secondary | ICD-10-CM | POA: Diagnosis not present

## 2023-01-01 DIAGNOSIS — I5042 Chronic combined systolic (congestive) and diastolic (congestive) heart failure: Secondary | ICD-10-CM | POA: Diagnosis not present

## 2023-01-01 DIAGNOSIS — Z95810 Presence of automatic (implantable) cardiac defibrillator: Secondary | ICD-10-CM | POA: Diagnosis not present

## 2023-01-01 DIAGNOSIS — U071 COVID-19: Secondary | ICD-10-CM | POA: Diagnosis not present

## 2023-01-01 DIAGNOSIS — J1282 Pneumonia due to coronavirus disease 2019: Secondary | ICD-10-CM | POA: Diagnosis not present

## 2023-01-01 DIAGNOSIS — N3000 Acute cystitis without hematuria: Secondary | ICD-10-CM | POA: Diagnosis not present

## 2023-01-01 DIAGNOSIS — E1122 Type 2 diabetes mellitus with diabetic chronic kidney disease: Secondary | ICD-10-CM | POA: Diagnosis not present

## 2023-01-01 DIAGNOSIS — I255 Ischemic cardiomyopathy: Secondary | ICD-10-CM | POA: Diagnosis not present

## 2023-01-01 DIAGNOSIS — I513 Intracardiac thrombosis, not elsewhere classified: Secondary | ICD-10-CM | POA: Diagnosis not present

## 2023-01-01 DIAGNOSIS — Z951 Presence of aortocoronary bypass graft: Secondary | ICD-10-CM | POA: Diagnosis not present

## 2023-01-01 DIAGNOSIS — N1832 Chronic kidney disease, stage 3b: Secondary | ICD-10-CM | POA: Diagnosis not present

## 2023-01-01 NOTE — Patient Instructions (Signed)
Visit Information  Thank you for taking time to visit with me today. Please don't hesitate to contact me if I can be of assistance to you before our next scheduled telephone appointment.  Our next appointment is by telephone on 01/07/23 at 1:30pm  Following is a copy of your care plan:   Goals Addressed             This Visit's Progress    TOC Care Plan       Current Barriers:  Chronic Disease Management support and education needs related to DMII   RNCM Clinical Goal(s):  Patient will work with the Care Management team over the next 30 days to address Transition of Care Barriers: disease mgmt take all medications exactly as prescribed and will call provider for medication related questions as evidenced by med adherence- compliance with filling pill box weekly attend all scheduled medical appointments:   as evidenced by completion of PCP appt  through collaboration with RN Care manager, provider, and care team.   Interventions: Evaluation of current treatment plan related to  self management and patient's adherence to plan as established by provider  Transitions of Care:  New goal.  Assessed for any changes in condition pt voices doing well-has had a good week Reviewed progress with HH services-pt states he is doing good-has been able to walk a "quarter mile to main office" at complex -he was told by therapist that he would probably get discharged from [facility] next week Assessed nutritional status and cbg monitoring-pt voices cbgs-fasting this AM was 180-admits that he ate some masked potatoes last night as HH nurse told him to "eat more carbs to better regulate blood sugars"- Pt aware that he should not have done that and it continue with his low to no carb diet measures Pt requested info on his recent labs from PCP visit-discussed with pt results and provider recommendations for possible repeat blood work in 2wks if pt desires -pt will consider and call office  Diabetes Interventions:   (Status:  Goal on track:  Yes.) Short Term Goal Assessed patient's understanding of A1c goal: <7%  Lab Results  Component Value Date   HGBA1C 6.1 10/16/2022    Patient Goals/Self-Care Activities: Participate in Transition of Care Program/Attend TOC scheduled calls Take all medications as prescribed Attend all scheduled provider appointments Perform all self care activities independently  Call provider office for new concerns or questions  check blood sugar at prescribed times: once daily Continue to work with [facility] therapy in the home Maintain safety in the home-report no falls  Follow Up Plan:  Telephone follow up appointment with care management team member scheduled for:  01/07/23-1:30pm The patient has been provided with contact information for the care management team and has been advised to call with any health related questions or concerns.          The patient verbalized understanding of instructions, educational materials, and care plan provided today and DECLINED offer to receive copy of patient instructions, educational materials, and care plan.   The patient has been provided with contact information for the care management team and has been advised to call with any health related questions or concerns.   Please call the care guide team at [phone] if you need to cancel or reschedule your appointment.   Please call the Suicide and Crisis Lifeline: 988 call the [place] National Suicide Prevention Lifeline: (253)590-8446 or TTY: 762-779-7038 TTY 701-570-5964) to talk to a trained counselor if you are experiencing  a Mental Health or Behavioral Health Crisis or need someone to talk to.  Antionette Fairy, RN,BSN,CCM RN Care Manager Transitions of Care  [place]Population [facility]  Direct Phone: [phone] Toll Free: [phone] Fax: [phone]

## 2023-01-01 NOTE — Patient Outreach (Signed)
Care Management  Transitions of Care Program Transitions of Care Post-discharge week 3   01/01/2023 Name: Joseph Malone MRN: [id number] DOB: February 15, 1930  Subjective: Joseph Malone is a 89 y.o. year old male who is a primary care patient of Shelva Majestic, MD. The Care Management team Engaged with patient Engaged with patient by telephone to assess and address transitions of care needs.   Consent to Services:  Patient was given information about care management services, agreed to services, and gave verbal consent to participate.   Assessment:    Patient voices he is doing well-has a good week. No acute issues or concerns at present. He has done his exercise and even done some household work already today. He gets a "little winded with exertion" but easily relieved with a  few mins of rest per pt. Denies any RN CM needs or concerns at this time.         SDOH Interventions    Flowsheet Row Patient Outreach from 12/18/2022 in [place] POPULATION HEALTH DEPARTMENT Telephone from 12/11/2022 in [place] POPULATION HEALTH DEPARTMENT Office Visit from 10/16/2022 in [place] [facility] Clinical Support from 08/21/2022 in [place] [facility] [employer] from 07/14/2022 in [facility] Care Coordination Telephone from 01/20/2022 in [facility] Care Coordination  SDOH Interventions        Food Insecurity Interventions Intervention Not Indicated Intervention Not Indicated -- Intervention Not Indicated Intervention Not Indicated Intervention Not Indicated  Housing Interventions Intervention Not Indicated Intervention Not Indicated -- Intervention Not Indicated -- --  Transportation Interventions Intervention Not Indicated Intervention Not Indicated -- Intervention Not Indicated Intervention Not Indicated Intervention Not Indicated  Utilities Interventions Intervention Not Indicated Intervention Not Indicated -- Intervention  Not Indicated -- --  Depression Interventions/Treatment  -- -- PHQ2-9 Score <4 Follow-up Not Indicated -- -- --  Financial Strain Interventions -- -- -- Intervention Not Indicated -- --  Physical Activity Interventions -- -- -- Intervention Not Indicated -- --  Stress Interventions -- -- -- Intervention Not Indicated -- --  Social Connections Interventions -- -- -- Intervention Not Indicated -- --  Health Literacy Interventions -- -- -- Intervention Not Indicated -- --        Goals Addressed             This Visit's Progress    TOC Care Plan       Current Barriers:  Chronic Disease Management support and education needs related to DMII   RNCM Clinical Goal(s):  Patient will work with the Care Management team over the next 30 days to address Transition of Care Barriers: disease mgmt take all medications exactly as prescribed and will call provider for medication related questions as evidenced by med adherence- compliance with filling pill box weekly attend all scheduled medical appointments:   as evidenced by completion of PCP appt  through collaboration with RN Care manager, provider, and care team.   Interventions: Evaluation of current treatment plan related to  self management and patient's adherence to plan as established by provider  Transitions of Care:  New goal.  Assessed for any changes in condition pt voices doing well-has had a good week Reviewed progress with HH services-pt states he is doing good-has been able to walk a "quarter mile to main office" at complex -he was told by therapist that he would probably get discharged from [facility] next week Assessed nutritional status and cbg monitoring-pt voices cbgs-fasting this AM was 180-admits that he  ate some masked potatoes last night as HH nurse told him to "eat more carbs to better regulate blood sugars"- Pt aware that he should not have done that and it continue with his low to no carb diet measures Pt requested info on his  recent labs from PCP visit-discussed with pt results and provider recommendations for possible repeat blood work in 2wks if pt desires -pt will consider and call office  Diabetes Interventions:  (Status:  Goal on track:  Yes.) Short Term Goal Assessed patient's understanding of A1c goal: <7%  Lab Results  Component Value Date   HGBA1C 6.1 10/16/2022    Patient Goals/Self-Care Activities: Participate in Transition of Care Program/Attend TOC scheduled calls Take all medications as prescribed Attend all scheduled provider appointments Perform all self care activities independently  Call provider office for new concerns or questions  check blood sugar at prescribed times: once daily Continue to work with [facility] therapy in the home Maintain safety in the home-report no falls  Follow Up Plan:  Telephone follow up appointment with care management team member scheduled for:  01/07/23-1:30pm The patient has been provided with contact information for the care management team and has been advised to call with any health related questions or concerns.          Plan: The patient has been provided with contact information for the care management team and has been advised to call with any health related questions or concerns.  Follow up appt scheduled with pt for 01/07/23-1:30pm   Antionette Fairy, RN,BSN,CCM RN Care Manager Transitions of Care  [place]Population [facility]  Direct Phone: [phone] Toll Free: [phone] Fax: [phone]

## 2023-01-02 ENCOUNTER — Ambulatory Visit
Admission: EM | Admit: 2023-01-02 | Discharge: 2023-01-02 | Disposition: A | Discharge status: Home / Self Care | Payer: Medicare Other | Attending: Internal Medicine | Admitting: Internal Medicine

## 2023-01-02 ENCOUNTER — Telehealth: Payer: Self-pay | Admitting: Family Medicine

## 2023-01-02 DIAGNOSIS — N200 Calculus of kidney: Secondary | ICD-10-CM

## 2023-01-02 DIAGNOSIS — R319 Hematuria, unspecified: Secondary | ICD-10-CM | POA: Diagnosis not present

## 2023-01-02 LAB — POCT URINALYSIS DIP (MANUAL ENTRY)
Bilirubin, UA: NEGATIVE
Glucose, UA: NEGATIVE mg/dL
Ketones, POC UA: NEGATIVE mg/dL
Leukocytes, UA: NEGATIVE
Nitrite, UA: NEGATIVE
Protein Ur, POC: 30 mg/dL — AB
Spec Grav, UA: 1.025 (ref 1.010–1.025)
Urobilinogen, UA: 0.2 U/dL
pH, UA: 5.5 (ref 5.0–8.0)

## 2023-01-02 NOTE — ED Triage Notes (Signed)
Pt c/o small amount of blood in urine and mild lower back pain started yesterday-feels like he may have passed a kidney stone-NAD-steady gait

## 2023-01-02 NOTE — Telephone Encounter (Signed)
Patient called in regards to urine results from 11/13. Patient stated he is now having blood in his urine.   FYI: This call has been transferred to triage nurse: Access Nurse. Once the result note has been entered staff can address the message at that time.  Patient called in with the following symptoms:  Red Word: blood in urine   Please advise at Mobile [phone] (mobile)  Message is routed to Provider Pool.

## 2023-01-02 NOTE — Discharge Instructions (Addendum)
Make sure you hydrate very well with plain water and a quantity of 64 ounces of water a day.  Please limit drinks that are considered urinary irritants such as soda, sweet tea, coffee, energy drinks, alcohol.  These can worsen your urinary and genital symptoms but also be the source of them.  I will let you know about your urine culture results through MyChart to see if we need to prescribe or change your antibiotics based off of those results. Keep taking tamsulosin.

## 2023-01-02 NOTE — Telephone Encounter (Signed)
Final outcome: See PCP within 24 hours. Per chart, patient has gone to the UC.      Patient Name First: Joseph Last: Malone Gender: Male DOB: 08/04/1930 Age: 89 Y 10 M 20 D Return Phone Number: [phone] (Primary) Address: City/ State/ Zip: [place]  [address] Client [facility] - Administrator, [facility] Provider Tana Conch- MD Contact Type Call Who Is Calling Patient / Member / Family / Caregiver Call Type Triage / Clinical Relationship To Patient Self Return Phone Number [phone] (Primary) Chief Complaint Urine, Blood In Reason for Call Symptomatic / Request for Health Information Initial Comment Caller states he is calling for himself. He has blood in his urine. Translation No Nurse Assessment Nurse: Nicholaus Bloom, RN, Judeth Cornfield Date/Time [name] Time): 01/02/2023 3:37:49 PM Confirm and document reason for call. If symptomatic, describe symptoms. ---caller reports some blood in urine, saw some yesterday, but now having stinging with urination, also wanting results from recent visit, Does the patient have any new or worsening symptoms? ---Yes Will a triage be completed? ---Yes Related visit to physician within the last 2 weeks? ---No Does the PT have any chronic conditions? (i.e. diabetes, asthma, this includes High risk factors for pregnancy, etc.) ---Yes List chronic conditions. ---kidney stones, uti, hypertension, MI, Is this a behavioral health or substance abuse call? ---No Guidelines Guideline Title Affirmed Question Affirmed Notes Nurse Date/Time (Eastern Time) Urine - Blood In Pain or burning with passing urine Nicholaus Bloom, RN, Judeth Cornfield 01/02/2023 3:41:30 PM Disp. Time [name] Time) Disposition Final User 01/02/2023 3:44:30 PM See PCP within 24 Hours Yes Nicholaus Bloom RN, Judeth Cornfield Final Disposition 01/02/2023 3:44:30 PM See PCP within 24 Hours Yes Nicholaus Bloom, RN, Judeth Cornfield Caller  Disagree/Comply Comply Caller Understands Yes PreDisposition Home Care Care Advice Given Per Guideline SEE PCP WITHIN 24 HOURS: DRINK EXTRA FLUIDS: * Drink extra fluids. * Drink 8 to 10 cups (1,800 to 2,400 ml) of liquids a day. * Reason: This will water-down your urine and make it less painful to pass. It will also help wash out any germs that may be in your bladder. PAIN MEDICINES: * For pain relief, you can take either acetaminophen, ibuprofen, or naproxen. CALL BACK IF: * Fever occurs * You become worse CARE ADVICE given per Urine - Blood In (Adult) guideline Referrals Warm transfer to backline [facility] - UC

## 2023-01-02 NOTE — Telephone Encounter (Signed)
FYI, urine culture results from 12/24/22 were normal.

## 2023-01-02 NOTE — ED Provider Notes (Signed)
[facility] - URGENT [facility]ARE CENTER  Note:  This document was prepared using Conservation officer, historic buildings and may include unintentional dictation errors.  MRN: [id number] DOB: 01-11-1931  Subjective:   Joseph Malone is a 89 y.o. male presenting for 1 day history of hematuria, mild occasional low back pain.  No dysuria, fever, nausea, vomiting, confusion, weakness, abdominal or pelvic pain.  Patient has history of nephrolithiasis.  He has also had urinary tract infections.  Does not feel like he is suffering from an active infection.  He is taking tamsulosin.  He is not hydrating as well as he probably could by his own admission.  He does drink Diet Coke regularly.  No current facility-administered medications for this encounter.  Current Outpatient Medications:    aspirin 81 MG tablet, Take 1 tablet (81 mg total) by mouth every evening., Disp: , Rfl:    folic acid (FOLVITE) 400 MCG tablet, Take 400 mcg by mouth daily., Disp: , Rfl:    Glucosamine HCl 1500 MG TABS, Take 1,500 mg by mouth every evening. , Disp: , Rfl:    Melatonin 5 MG CHEW, Chew 1 tablet by mouth daily as needed (sleep)., Disp: , Rfl:    metFORMIN (GLUCOPHAGE-XR) 500 MG 24 hr tablet, TAKE 1 TABLET(500 MG) BY MOUTH DAILY WITH BREAKFAST, Disp: 90 tablet, Rfl: 3   Multiple Vitamin (MULTIVITAMIN WITH MINERALS) TABS, Take 1 tablet by mouth daily. , Disp: , Rfl:    nitroGLYCERIN (NITROSTAT) 0.4 MG SL tablet, Place 1 tablet (0.4 mg total) under the tongue every 5 (five) minutes as needed for chest pain. 1 tablet every 5 minutes for 3 dose only, Disp: 90 tablet, Rfl: 3   omeprazole (PRILOSEC) 40 MG capsule, Take 1 capsule (40 mg total) by mouth daily., Disp: 90 capsule, Rfl: 3   simvastatin (ZOCOR) 40 MG tablet, Take 0.5 tablets (20 mg total) by mouth at bedtime., Disp: , Rfl:    spironolactone (ALDACTONE) 25 MG tablet, TAKE 1/2 TABLET BY MOUTH DAILY (Patient taking differently: Take 12.5 mg by mouth as needed (weight gain if he  picks up 3ib overnight.).), Disp: 90 tablet, Rfl: 3   tamsulosin (FLOMAX) 0.4 MG CAPS capsule, Take 0.4 mg by mouth at bedtime., Disp: , Rfl:    vitamin B-12 (CYANOCOBALAMIN) 100 MCG tablet, Take 100 mcg by mouth daily., Disp: , Rfl:    warfarin (COUMADIN) 5 MG tablet, TAKE 1 TABLET BY MOUTH ON MONDAY, WEDNESDAY, AND FRIDAY, AND TAKE ONE-HALF TABLET ALL OTHER DAYS OR AS DIRECTED, Disp: 100 tablet, Rfl: 1   Allergies  Allergen Reactions   Penicillins Rash and Other (See Comments)    Because of a history of documented adverse serious drug reaction;Medi Alert bracelet  is recommended Has patient had a PCN reaction causing immediate rash, facial/tongue/throat swelling, SOB or lightheadedness with hypotension: No Has patient had a PCN reaction causing severe rash involving mucus membranes or skin necrosis: No Has patient had a PCN reaction that required hospitalization: No Has patient had a PCN reaction occurring within the last 10 years: No If all of the above answers are "NO", t    Past Medical History:  Diagnosis Date   Anxiety    BPH (benign prostatic hypertrophy)    CAD (coronary artery disease)    s/p CABG   CHF (congestive heart failure) (HCC)    Diabetes 1.5, managed as type 2 (HCC)    Diverticulosis    Diverticulosis of colon without hemorrhage 10/22/2007   Qualifier: Diagnosis of  By: Alwyn Ren MD, Chrissie Noa     DOE (dyspnea on exertion)    Excess weight    Fasting hyperglycemia    Generalized weakness    GERD (gastroesophageal reflux disease)    Heart attack (HCC) 2001   Heart failure    CHF due to ischemic CM   History of kidney stones    Hyperlipidemia    Hypertension    Ischemic cardiomyopathy    Microscopic hematuria    [facility]   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     Past Surgical History:  Procedure Laterality Date   APPENDECTOMY     BIOPSY  10/21/2018   Procedure: BIOPSY;  Surgeon: Benancio Deeds, MD;  Location: [facility];  Service:  Gastroenterology;;   BIOPSY  12/15/2022   Procedure: BIOPSY;  Surgeon: Hilarie Fredrickson, MD;  Location: [facility];  Service: Gastroenterology;;   CARDIAC CATHETERIZATION  2000   CARDIAC DEFIBRILLATOR PLACEMENT  2005   [employer]; s/p ICD gen change and RV lead revision April 2015   CATARACT EXTRACTION  2008   bilateral with lens implant   COLONOSCOPY  2004   Tics; [facility]   COLONOSCOPY WITH PROPOFOL N/A 01/16/2022   Procedure: COLONOSCOPY WITH PROPOFOL;  Surgeon: Napoleon Form, MD;  Location: [facility];  Service: Gastroenterology;  Laterality: N/A;   CORONARY ARTERY BYPASS GRAFT  2000   1 vessel   CYSTOSCOPY  06/2012   Dr Margarita Grizzle   ESOPHAGOGASTRODUODENOSCOPY (EGD) WITH PROPOFOL N/A 10/21/2018   Procedure: ESOPHAGOGASTRODUODENOSCOPY (EGD) WITH PROPOFOL;  Surgeon: Benancio Deeds, MD;  Location: [facility];  Service: Gastroenterology;  Laterality: N/A;   ESOPHAGOGASTRODUODENOSCOPY (EGD) WITH PROPOFOL N/A 12/15/2022   Procedure: ESOPHAGOGASTRODUODENOSCOPY (EGD) WITH PROPOFOL;  Surgeon: Hilarie Fredrickson, MD;  Location: [facility];  Service: Gastroenterology;  Laterality: N/A;   HOT HEMOSTASIS N/A 10/21/2018   Procedure: HOT HEMOSTASIS (ARGON PLASMA COAGULATION/BICAP);  Surgeon: Benancio Deeds, MD;  Location: [facility];  Service: Gastroenterology;  Laterality: N/A;   IMPLANTABLE CARDIOVERTER DEFIBRILLATOR (ICD) GENERATOR CHANGE N/A 06/01/2013   Procedure: ICD GENERATOR CHANGE;  Surgeon: Marinus Maw, MD;  Location: [facility] CATH LAB;  Service: Cardiovascular;  Laterality: N/A;   LEAD REVISION N/A 06/01/2013   Procedure: LEAD REVISION;  Surgeon: Marinus Maw, MD;  Location: [facility] CATH LAB;  Service: Cardiovascular;  Laterality: N/A;   PILONIDAL CYST EXCISION     POLYPECTOMY  01/16/2022   Procedure: POLYPECTOMY;  Surgeon: Napoleon Form, MD;  Location: [facility];  Service: Gastroenterology;;   RIGHT/LEFT HEART CATH AND CORONARY/GRAFT ANGIOGRAPHY N/A 06/12/2017   Procedure:  RIGHT/LEFT HEART CATH AND CORONARY/GRAFT ANGIOGRAPHY;  Surgeon: Dolores Patty, MD;  Location: [facility] INVASIVE CV LAB;  Service: Cardiovascular;  Laterality: N/A;   TRANSTHORACIC ECHOCARDIOGRAM  08/29/2005    Family History  Problem Relation Age of Onset   Hypertension Father    Heart attack Father 33   Diabetes Brother        X 2   Nephrolithiasis Brother    Heart attack Brother 53   Heart attack Paternal Uncle 40   Sudden death Paternal Grandfather 49       ? heat stroke   Lung cancer Sister        "Asian lung cancer"   Stroke Neg Hx    Colon cancer Neg Hx    Esophageal cancer Neg Hx    Rectal cancer Neg Hx    Stomach cancer Neg Hx     Social History  Tobacco Use   Smoking status: Never    Passive exposure: Yes   Smokeless tobacco: Never  Vaping Use   Vaping status: Never Used  Substance Use Topics   Alcohol use: No   Drug use: No    ROS   Objective:   Vitals: BP 124/72 (BP Location: Left Arm)   Pulse 97   Temp 98 F (36.7 C) (Oral)   Resp 20   SpO2 93%   Physical Exam Constitutional:      General: He is not in acute distress.    Appearance: Normal appearance. He is well-developed and normal weight. He is not ill-appearing, toxic-appearing or diaphoretic.  HENT:     Head: Normocephalic and atraumatic.     Right Ear: External ear normal.     Left Ear: External ear normal.     Nose: Nose normal.     Mouth/Throat:     Pharynx: Oropharynx is clear.  Eyes:     General: No scleral icterus.       Right eye: No discharge.        Left eye: No discharge.     Extraocular Movements: Extraocular movements intact.  Cardiovascular:     Rate and Rhythm: Normal rate.  Pulmonary:     Effort: Pulmonary effort is normal.  Abdominal:     General: Bowel sounds are normal. There is no distension.     Palpations: Abdomen is soft. There is no mass.     Tenderness: There is no abdominal tenderness. There is no right CVA tenderness, left CVA tenderness, guarding or  rebound.  Musculoskeletal:     Cervical back: Normal range of motion.  Neurological:     Mental Status: He is alert and oriented to person, place, and time.     Cranial Nerves: No cranial nerve deficit.     Motor: No weakness.     Coordination: Coordination normal.     Gait: Gait normal.  Psychiatric:        Mood and Affect: Mood normal.        Behavior: Behavior normal.        Thought Content: Thought content normal.        Judgment: Judgment normal.     Results for orders placed or performed during the hospital encounter of 01/02/23 (from the past 24 hour(s))  POCT urinalysis dipstick     Status: Abnormal   Collection Time: 01/02/23  6:06 PM  Result Value Ref Range   Color, UA yellow yellow   Clarity, UA cloudy (A) clear   Glucose, UA negative negative mg/dL   Bilirubin, UA negative negative   Ketones, POC UA negative negative mg/dL   Spec Grav, UA 2.440 1.027 - 1.025   Blood, UA large (A) negative   pH, UA 5.5 5.0 - 8.0   Protein Ur, POC =30 (A) negative mg/dL   Urobilinogen, UA 0.2 0.2 or 1.0 E.U./dL   Nitrite, UA Negative Negative   Leukocytes, UA Negative Negative    Assessment and Plan :   PDMP not reviewed this encounter.  1. Hematuria, unspecified type   2. Nephrolithiasis    Most probable recurrent nephrolithiasis.  Urine culture pending.  Emphasized need to hydrate more consistently, continue taking tamsulosin.  Patient does not endorse any kind of pain and therefore recommended Tylenol.  Reviewed strict ER precautions.  Follow-up with his urologist and her PCP otherwise.   Wallis Bamberg, [place] 01/02/23 2536

## 2023-01-04 LAB — URINE CULTURE: Culture: NO GROWTH

## 2023-01-05 DIAGNOSIS — Z7984 Long term (current) use of oral hypoglycemic drugs: Secondary | ICD-10-CM | POA: Diagnosis not present

## 2023-01-05 DIAGNOSIS — I5042 Chronic combined systolic (congestive) and diastolic (congestive) heart failure: Secondary | ICD-10-CM | POA: Diagnosis not present

## 2023-01-05 DIAGNOSIS — U071 COVID-19: Secondary | ICD-10-CM | POA: Diagnosis not present

## 2023-01-05 DIAGNOSIS — N1832 Chronic kidney disease, stage 3b: Secondary | ICD-10-CM | POA: Diagnosis not present

## 2023-01-05 DIAGNOSIS — E1122 Type 2 diabetes mellitus with diabetic chronic kidney disease: Secondary | ICD-10-CM | POA: Diagnosis not present

## 2023-01-05 DIAGNOSIS — N400 Enlarged prostate without lower urinary tract symptoms: Secondary | ICD-10-CM | POA: Diagnosis not present

## 2023-01-05 DIAGNOSIS — Z951 Presence of aortocoronary bypass graft: Secondary | ICD-10-CM | POA: Diagnosis not present

## 2023-01-05 DIAGNOSIS — E785 Hyperlipidemia, unspecified: Secondary | ICD-10-CM | POA: Diagnosis not present

## 2023-01-05 DIAGNOSIS — Z95810 Presence of automatic (implantable) cardiac defibrillator: Secondary | ICD-10-CM | POA: Diagnosis not present

## 2023-01-05 DIAGNOSIS — I513 Intracardiac thrombosis, not elsewhere classified: Secondary | ICD-10-CM | POA: Diagnosis not present

## 2023-01-05 DIAGNOSIS — B9620 Unspecified Escherichia coli [E. coli] as the cause of diseases classified elsewhere: Secondary | ICD-10-CM | POA: Diagnosis not present

## 2023-01-05 DIAGNOSIS — J1282 Pneumonia due to coronavirus disease 2019: Secondary | ICD-10-CM | POA: Diagnosis not present

## 2023-01-05 DIAGNOSIS — I130 Hypertensive heart and chronic kidney disease with heart failure and stage 1 through stage 4 chronic kidney disease, or unspecified chronic kidney disease: Secondary | ICD-10-CM | POA: Diagnosis not present

## 2023-01-05 DIAGNOSIS — R911 Solitary pulmonary nodule: Secondary | ICD-10-CM | POA: Diagnosis not present

## 2023-01-05 DIAGNOSIS — N3000 Acute cystitis without hematuria: Secondary | ICD-10-CM | POA: Diagnosis not present

## 2023-01-05 DIAGNOSIS — I255 Ischemic cardiomyopathy: Secondary | ICD-10-CM | POA: Diagnosis not present

## 2023-01-05 NOTE — Telephone Encounter (Signed)
Called and spoke with pt and made aware.

## 2023-01-05 NOTE — Telephone Encounter (Signed)
FYI

## 2023-01-05 NOTE — Telephone Encounter (Signed)
Urine culture came back negative-please recommend him to follow-up with his urologist

## 2023-01-07 ENCOUNTER — Other Ambulatory Visit: Payer: Self-pay

## 2023-01-07 DIAGNOSIS — N200 Calculus of kidney: Secondary | ICD-10-CM | POA: Diagnosis not present

## 2023-01-07 DIAGNOSIS — N401 Enlarged prostate with lower urinary tract symptoms: Secondary | ICD-10-CM | POA: Diagnosis not present

## 2023-01-07 DIAGNOSIS — R3915 Urgency of urination: Secondary | ICD-10-CM | POA: Diagnosis not present

## 2023-01-07 NOTE — Patient Instructions (Signed)
Visit Information  Thank you for taking time to visit with me today. Please don't hesitate to contact me if I can be of assistance to you before our next scheduled telephone appointment.  Our next appointment is by telephone on 01/14/23 at 1:30 pm  Following is a copy of your care plan:   Goals Addressed             This Visit's Progress    TOC Care Plan       Current Barriers:  Chronic Disease Management support and education needs related to DMII   RNCM Clinical Goal(s):  Patient will work with the Care Management team over the next 30 days to address Transition of Care Barriers: disease mgmt take all medications exactly as prescribed and will call provider for medication related questions as evidenced by med adherence- compliance with filling pill box weekly attend all scheduled medical appointments:   as evidenced by completion of PCP appt  through collaboration with RN Care manager, provider, and care team.   Interventions: Evaluation of current treatment plan related to  self management and patient's adherence to plan as established by provider  Transitions of Care:  Goal on track:  Yes.  Assessed for any changes in condition pt voices doing well-has had a good week Reviewed progress with HH services-pt has completed HHPT services and was discharged from [place] services this week, he continues to go walking and do his exercises daily Discussed with pt recent urgent care visit-pt voices he was advised by PCP to go to urgent care as no opening available for further workup on hematuria- he was cleared/ruled out from having a UTI,kidney stone or other issues- sxs have resolved   Diabetes Interventions:  (Status:  Goal on track:  Yes.) Short Term Goal Assessed patient's understanding of A1c goal: <7%  Lab Results  Component Value Date   HGBA1C 6.1 10/16/2022  -Nutrition assessment done- pt continues to report that he has a good appetite-sticking to low carb diet -Reviewed cbgs  readings- pt voices blood sugars have been in the 130-150s -Discussed with pt importance of routine foot exams for Diabetics- pt has not seen a podiatrist in several years-he will contact insurance plan to get list of in-network providers to get an appt   Patient Goals/Self-Care Activities: Participate in Transition of Care Program/Attend TOC scheduled calls Take all medications as prescribed Attend all scheduled provider appointments Perform all self care activities independently  Call provider office for new concerns or questions  check blood sugar at prescribed times: once daily Continue to work with [facility] therapy in the home Maintain safety in the home-report no falls Check feet daily for any open cuts,bruises or wounds Make podiatry appt  Follow Up Plan:  Telephone follow up appointment with care management team member scheduled for:  01/14/23 1:30 pm The patient has been provided with contact information for the care management team and has been advised to call with any health related questions or concerns.          The patient verbalized understanding of instructions, educational materials, and care plan provided today and DECLINED offer to receive copy of patient instructions, educational materials, and care plan.   The patient has been provided with contact information for the care management team and has been advised to call with any health related questions or concerns.   Please call the care guide team at [phone] if you need to cancel or reschedule your appointment.   Please call the Suicide  and Crisis Lifeline: 988 call the [place] National Suicide Prevention Lifeline: 661 837 8091 or TTY: 901-012-6752 TTY (952)206-4087) to talk to a trained counselor if you are experiencing a Mental Health or Behavioral Health Crisis or need someone to talk to.  Antionette Fairy, RN,BSN,CCM RN Care Manager Transitions of Care  [place]/Population Health  Direct Phone:  [phone] Toll Free: [phone] Fax: [phone]

## 2023-01-07 NOTE — Patient Outreach (Addendum)
Care Management  Transitions of Care Program Transitions of Care Post-discharge week 4   01/07/2023 Name: Joseph Malone MRN: [id number] DOB: 1930-08-10  Subjective: Joseph Malone is a 89 y.o. year old male who is a primary care patient of Shelva Majestic, MD. The Care Management team Engaged with patient Engaged with patient by telephone to assess and address transitions of care needs.   Consent to Services:  Patient was given information about care management services, agreed to services, and gave verbal consent to participate.   Assessment:   Pt has being doing good. He did have an urgent care visit a few days ago for some hematuria. He has recovered. No acute issues. Appetite good, wgt, cbgs WNL for pt. He denies any RN CM needs or concerns at this time.         SDOH Interventions    Flowsheet Row Patient Outreach from 12/18/2022 in [place] [facility] DEPARTMENT Telephone from 12/11/2022 in [place] POP[facility]ATION [facility] DEPARTMENT Office Visit from 10/16/2022 in [facility] [place] Clinical Support from 08/21/2022 in [facility] [place] Telephone from 07/14/2022 in [facility] Care Coordination Telephone from 01/20/2022 in [facility] Care Coordination  SDOH Interventions        Food Insecurity Interventions Intervention Not Indicated Intervention Not Indicated -- Intervention Not Indicated Intervention Not Indicated Intervention Not Indicated  Housing Interventions Intervention Not Indicated Intervention Not Indicated -- Intervention Not Indicated -- --  Transportation Interventions Intervention Not Indicated Intervention Not Indicated -- Intervention Not Indicated Intervention Not Indicated Intervention Not Indicated  Utilities Interventions Intervention Not Indicated Intervention Not Indicated -- Intervention Not Indicated -- --  Depression Interventions/Treatment  -- --  PHQ2-9 Score <4 Follow-up Not Indicated -- -- --  Financial Strain Interventions -- -- -- Intervention Not Indicated -- --  Physical Activity Interventions -- -- -- Intervention Not Indicated -- --  Stress Interventions -- -- -- Intervention Not Indicated -- --  Social Connections Interventions -- -- -- Intervention Not Indicated -- --  Health Literacy Interventions -- -- -- Intervention Not Indicated -- --        Goals Addressed             This Visit's Progress    TOC Care Plan       Current Barriers:  Chronic Disease Management support and education needs related to DMII   RNCM Clinical Goal(s):  Patient will work with the Care Management team over the next 30 days to address Transition of Care Barriers: disease mgmt take all medications exactly as prescribed and will call provider for medication related questions as evidenced by med adherence- compliance with filling pill box weekly attend all scheduled medical appointments:   as evidenced by completion of PCP appt  through collaboration with RN Care manager, provider, and care team.   Interventions: Evaluation of current treatment plan related to  self management and patient's adherence to plan as established by provider  Transitions of Care:  Goal on track:  Yes.  Assessed for any changes in condition pt voices doing well-has had a good week Reviewed progress with HH services-pt has completed HHPT services and was discharged from [place] services this week, he continues to go walking and do his exercises daily Discussed with pt recent urgent care visit-pt voices he was advised by PCP to go to urgent care as no opening available for further workup on hematuria- he was  cleared/ruled out from having a UTI,kidney stone or other issues- sxs have resolved   Diabetes Interventions:  (Status:  Goal on track:  Yes.) Short Term Goal Assessed patient's understanding of A1c goal: <7%  Lab Results  Component Value Date   HGBA1C 6.1  10/16/2022  -Nutrition assessment done- pt continues to report that he has a good appetite-sticking to low carb diet -Reviewed cbgs readings- pt voices blood sugars have been in the 130-150s -Discussed with pt importance of routine foot exams for Diabetics- pt has not seen a podiatrist in several years-he will contact insurance plan to get list of in-network providers to get an appt   Patient Goals/Self-Care Activities: Participate in Transition of Care Program/Attend TOC scheduled calls Take all medications as prescribed Attend all scheduled provider appointments Perform all self care activities independently  Call provider office for new concerns or questions  check blood sugar at prescribed times: once daily Continue to work with [facility] therapy in the home Maintain safety in the home-report no falls Check feet daily for any open cuts,bruises or wounds Make podiatry appt  Follow Up Plan:  Telephone follow up appointment with care management team member scheduled for:  01/15/23 1:30 pm The patient has been provided with contact information for the care management team and has been advised to call with any health related questions or concerns.          Plan: The patient has been provided with contact information for the care management team and has been advised to call with any health related questions or concerns.  Follow up appt scheduled with pt for 01/15/23- 1:30pm  Antionette Fairy, RN,BSN,CCM RN Care Manager Transitions of Care  [place]/Population Health  Direct Phone: [phone] Toll Free: [phone] Fax: [phone]

## 2023-01-13 DIAGNOSIS — X32XXXD Exposure to sunlight, subsequent encounter: Secondary | ICD-10-CM | POA: Diagnosis not present

## 2023-01-13 DIAGNOSIS — L570 Actinic keratosis: Secondary | ICD-10-CM | POA: Diagnosis not present

## 2023-01-13 DIAGNOSIS — D225 Melanocytic nevi of trunk: Secondary | ICD-10-CM | POA: Diagnosis not present

## 2023-01-13 DIAGNOSIS — B078 Other viral warts: Secondary | ICD-10-CM | POA: Diagnosis not present

## 2023-01-15 ENCOUNTER — Other Ambulatory Visit: Payer: Self-pay

## 2023-01-15 NOTE — Patient Instructions (Signed)
Visit Information  Thank you for taking time to visit with me today. Please don't hesitate to contact me if I can be of assistance to you in the future.   Following is a copy of your care plan:   Goals Addressed             This Visit's Progress    COMPLETED: TOC Care Plan       Current Barriers:  Chronic Disease Management support and education needs related to DMII   RNCM Clinical Goal(s):  Patient will work with the Care Management team over the next 30 days to address Transition of Care Barriers: disease mgmt take all medications exactly as prescribed and will call provider for medication related questions as evidenced by med adherence- compliance with filling pill box weekly attend all scheduled medical appointments:   as evidenced by completion of PCP appt  through collaboration with RN Care manager, provider, and care team.   Interventions: Evaluation of current treatment plan related to  self management and patient's adherence to plan as established by provider  Transitions of Care:  Goal Met.  Assessed for any changes in condition pt voices doing well-he continues to make improvements  Diabetes Interventions:  (Status:  Goal Met.) Short Term Goal Assessed patient's understanding of A1c goal: <7%  Lab Results  Component Value Date   HGBA1C 6.1 10/16/2022  -Assessed Nutrition- pt with good appetite-watching hs diet cbg 140 today Assessed mobility/strengthening- pt continues to report that he feels like he is getting stronger and better-now going to the [facility] 3x/wk-M,W,F-doing treadmill for 1 mins, stationary bike for and leg lifts for  Patient Goals/Self-Care Activities: Participate in Transition of Care Program/Attend TOC scheduled calls Take all medications as prescribed Attend all scheduled provider appointments Perform all self care activities independently  Call provider office for new concerns or questions  check blood sugar at prescribed times:  once daily Continue to work with [facility] therapy in the home Maintain safety in the home-report no falls   Follow Up Plan:  The patient has been provided with contact information for the care management team and has been advised to call with any health related questions or concerns.  Patient has completed 30 day TOC program. No further follow up needed.          The patient verbalized understanding of instructions, educational materials, and care plan provided today and DECLINED offer to receive copy of patient instructions, educational materials, and care plan.   The patient has been provided with contact information for the care management team and has been advised to call with any health related questions or concerns.    Please call the Suicide and Crisis Lifeline: 988 call the [place] National Suicide Prevention Lifeline: 910-133-2482 or TTY: 202-647-8476 TTY 506-648-3632) to talk to a trained counselor if you are experiencing a Mental Health or Behavioral Health Crisis or need someone to talk to.  Antionette Fairy, RN,BSN,CCM RN Care Manager Transitions of Care  [place]/Population Health  Direct Phone: [phone] Toll Free: [phone] Fax: [phone]

## 2023-01-15 NOTE — Patient Outreach (Signed)
Care Management  Transitions of Care Program Transitions of Care Post-discharge Week 5   01/15/2023 Name: Joseph Malone MRN: [id number] DOB: April 29, 1930  Subjective: Joseph Malone is a 89 y.o. year old male who is a primary care patient of Shelva Majestic, MD. The Care Management team Engaged with patient by telephone to assess and address transitions of care needs.   Consent to Services:  Patient has successfully completed 30-day TOC program. Declined transfer to longitudinal RN CM. Case is being closed at this time. Patient has RN CM contact info and aware they can contact RN CM in the future if needs arise.  Assessment:     Patient continues to make  progress and improvements.He has had a good week-back going to the [facility] for some light exercising which makes him feel good. blood sugars controlled. He goes to Coumadin Clinic next week. Denies any RN CM needs or concerns at this time.      SDOH Interventions    Flowsheet Row Patient Outreach from 12/18/2022 in [place] [facility] Telephone from 12/11/2022 in [place] [facility] Office Visit from 10/16/2022 in [facility] [place] Clinical Support from 08/21/2022 in [facility] [place] Telephone from 07/14/2022 in [facility] Care Coordination Telephone from 01/20/2022 in [facility] Care Coordination  SDOH Interventions        Food Insecurity Interventions Intervention Not Indicated Intervention Not Indicated -- Intervention Not Indicated Intervention Not Indicated Intervention Not Indicated  Housing Interventions Intervention Not Indicated Intervention Not Indicated -- Intervention Not Indicated -- --  Transportation Interventions Intervention Not Indicated Intervention Not Indicated -- Intervention Not Indicated Intervention Not Indicated Intervention Not Indicated  Utilities Interventions  Intervention Not Indicated Intervention Not Indicated -- Intervention Not Indicated -- --  Depression Interventions/Treatment  -- -- PHQ2-9 Score <4 Follow-up Not Indicated -- -- --  Financial Strain Interventions -- -- -- Intervention Not Indicated -- --  Physical Activity Interventions -- -- -- Intervention Not Indicated -- --  Stress Interventions -- -- -- Intervention Not Indicated -- --  Social Connections Interventions -- -- -- Intervention Not Indicated -- --  Health Literacy Interventions -- -- -- Intervention Not Indicated -- --        Goals Addressed             This Visit's Progress    COMPLETED: TOC Care Plan       Current Barriers:  Chronic Disease Management support and education needs related to DMII   RNCM Clinical Goal(s):  Patient will work with the Care Management team over the next 30 days to address Transition of Care Barriers: disease mgmt take all medications exactly as prescribed and will call provider for medication related questions as evidenced by med adherence- compliance with filling pill box weekly attend all scheduled medical appointments:   as evidenced by completion of PCP appt  through collaboration with RN Care manager, provider, and care team.   Interventions: Evaluation of current treatment plan related to  self management and patient's adherence to plan as established by provider  Transitions of Care:  Goal Met.  Assessed for any changes in condition pt voices doing well-he continues to make improvements  Diabetes Interventions:  (Status:  Goal Met.) Short Term Goal Assessed patient's understanding of A1c goal: <7%  Lab Results  Component Value Date   HGBA1C 6.1 10/16/2022  -Assessed Nutrition- pt with good appetite-watching hs  diet cbg 140 today Assessed mobility/strengthening- pt continues to report that he feels like he is getting stronger and better-now going to the [facility] 3x/wk-M,W,F-doing treadmill for 1 mins, stationary bike for  and leg lifts for  Patient Goals/Self-Care Activities: Participate in Transition of Care Program/Attend TOC scheduled calls Take all medications as prescribed Attend all scheduled provider appointments Perform all self care activities independently  Call provider office for new concerns or questions  check blood sugar at prescribed times: once daily Continue to work with [facility] therapy in the home Maintain safety in the home-report no falls   Follow Up Plan:  The patient has been provided with contact information for the care management team and has been advised to call with any health related questions or concerns.  Patient has completed 30 day TOC program. No further follow up needed.          Plan:  No further follow up at this time.    Antionette Fairy, RN,BSN,CCM RN Care Manager Transitions of Care  [place]  Direct Phone: [phone] Toll Free: [phone] Fax: [phone]

## 2023-01-21 ENCOUNTER — Ambulatory Visit (INDEPENDENT_AMBULATORY_CARE_PROVIDER_SITE_OTHER): Payer: Medicare Other

## 2023-01-21 ENCOUNTER — Telehealth: Payer: Self-pay | Admitting: Family Medicine

## 2023-01-21 DIAGNOSIS — Z7901 Long term (current) use of anticoagulants: Secondary | ICD-10-CM | POA: Diagnosis not present

## 2023-01-21 LAB — POCT INR: INR: 2.9 (ref 2.0–3.0)

## 2023-01-21 NOTE — Telephone Encounter (Signed)
Joseph Malone with [facility] requests an updated RX for warfarin (COUMADIN) 5 MG tablet to be sent to Pharmacy.  Joseph Malone requests to be called for any questions.

## 2023-01-21 NOTE — Patient Instructions (Addendum)
Pre visit review using our clinic review tool, if applicable. No additional management support is needed unless otherwise documented below in the visit note.  Continue 1 tablet daily except 1/2 tablet on Mondays. Recheck in 6 weeks.

## 2023-01-21 NOTE — Progress Notes (Signed)
I have reviewed and agree with note, evaluation, plan.   Jeffory Snelgrove, MD  

## 2023-01-21 NOTE — Progress Notes (Signed)
Pt in ER on 11/22 for hematuria and diagnosed with recurrent nephrolithiasis. Pt denies any current hematuria. Continue 1 tablet daily except 1/2 tablet on Mondays. Recheck in 6 weeks.

## 2023-01-22 MED ORDER — WARFARIN SODIUM 5 MG PO TABS: ORAL_TABLET | ORAL | 1 refills | Status: DC

## 2023-01-22 NOTE — Addendum Note (Signed)
Addended by: Sherrie George A on: 01/22/2023 02:58 PM   Modules accepted: Orders

## 2023-01-22 NOTE — Telephone Encounter (Signed)
Pt called to report a prescription for warfarin was sent in by the ER provider on 11/22. He did not look at the prescription until now and noticed it did not have the same instructions as given in the coumadin clinic yesterday. He is unsure if he is to follow the prescription or coumadin clinic instructions. He reported he thought they were worried about a bleeding ulcer, but also said he doesn't think he was listening to exactly what they said.  He reports the provider just said they are going to restart his warfarin and that they sent in a script. Pt denies any current bleeding or abnormal bruising. Advised this nurse will review the chart and contact him this afternoon.   Yesterday pt was in range, 2.9 (range 2.0-3.0) with the dosing given at last coumadin clinic apt. If warfarin dose is lowered to the instructions on the prescription sent in by the ER provider, that will be a 7.5 mg weekly dose decrease. This will not be a sufficient dosage to keep pt in his current range.   Review of ER notes does not mention any change in dosing of warfarin or any mention of concern for ulcer bleed.  Prescription sent by ER was exactly the same script sent in earlier this year in April, by Eliezer Champagne, CPhT. Neither scripts were updated to current dosing given in coumadin clinic. INR was not tested in ER.  Recommend continuing current dosing instructions given at coumadin clinic apt yesterday.

## 2023-01-22 NOTE — Telephone Encounter (Signed)
I completely agree with maintaining Coumadin clinic/Shannon's recommendations.  Carollee Herter is going to send in an updated prescription.  We greatly appreciate her assistance

## 2023-01-22 NOTE — Telephone Encounter (Signed)
Contacted pt and advised to maintain dosing given at coumadin clinic apt yesterday.  Advised a new script will be sent to pharmacy and if any changes to contact coumadin clinic. Advised if any s/s of abnormal bruising or bleeding to go to ER. Pt verbalized understanding.   Sent in new warfarin script.

## 2023-01-22 NOTE — Telephone Encounter (Signed)
Had to resend script because it printed and did not go electronically. Resent script.

## 2023-02-02 ENCOUNTER — Telehealth: Payer: Self-pay

## 2023-02-02 NOTE — Telephone Encounter (Signed)
Monitor ordered. He should receive it in 7-10 business days.

## 2023-02-10 ENCOUNTER — Telehealth: Payer: Self-pay | Admitting: Internal Medicine

## 2023-02-10 NOTE — Telephone Encounter (Signed)
Pt is calling about sending his old machine back. He has a questions about a plug and wants to know if he should keep that or send it back.

## 2023-02-12 ENCOUNTER — Telehealth: Payer: Self-pay

## 2023-02-12 NOTE — Telephone Encounter (Signed)
 Noted thanks-glad he figured it out and sugars are better again

## 2023-02-12 NOTE — Telephone Encounter (Signed)
 Pt LVM on coumadin  clinic VM two days ago. Coumadin  clinic nurse was not in office at time. Returned call today. Pt reports his glucose was elevated, around 170 for 3 days and he cold not figure out why. He reports he also had a head cold and was taking cough syrup. He checked and this cough syrup does not interact with warfarin. He said he finally figured out why his glucose was elevated. He had fruit cake and some chocolate candy he had forgotten he had. He reports his glucose is /now normal but will discuss with PCP at next apt on 02/17/23. Advised if any changed to contact PCP office. Pt verbalized understanding.

## 2023-02-13 NOTE — Telephone Encounter (Signed)
 Pt mailed monitor back.

## 2023-02-17 ENCOUNTER — Ambulatory Visit (INDEPENDENT_AMBULATORY_CARE_PROVIDER_SITE_OTHER): Payer: Medicare Other | Admitting: Family Medicine

## 2023-02-17 ENCOUNTER — Encounter: Payer: Self-pay | Admitting: Family Medicine

## 2023-02-17 VITALS — BP 130/72 | HR 84 | Temp 97.2°F | Ht 71.0 in | Wt 220.8 lb

## 2023-02-17 DIAGNOSIS — N1832 Chronic kidney disease, stage 3b: Secondary | ICD-10-CM

## 2023-02-17 DIAGNOSIS — E119 Type 2 diabetes mellitus without complications: Secondary | ICD-10-CM

## 2023-02-17 DIAGNOSIS — Z7984 Long term (current) use of oral hypoglycemic drugs: Secondary | ICD-10-CM

## 2023-02-17 DIAGNOSIS — E1169 Type 2 diabetes mellitus with other specified complication: Secondary | ICD-10-CM

## 2023-02-17 DIAGNOSIS — E785 Hyperlipidemia, unspecified: Secondary | ICD-10-CM

## 2023-02-17 DIAGNOSIS — I5022 Chronic systolic (congestive) heart failure: Secondary | ICD-10-CM

## 2023-02-17 DIAGNOSIS — I10 Essential (primary) hypertension: Secondary | ICD-10-CM | POA: Diagnosis not present

## 2023-02-17 DIAGNOSIS — U071 COVID-19: Secondary | ICD-10-CM

## 2023-02-17 LAB — CBC WITH DIFFERENTIAL/PLATELET
Basophils Absolute: 0.1 10*3/uL (ref 0.0–0.1)
Basophils Relative: 1 % (ref 0.0–3.0)
Eosinophils Absolute: 0.3 10*3/uL (ref 0.0–0.7)
Eosinophils Relative: 3.2 % (ref 0.0–5.0)
HCT: 40.2 % (ref 39.0–52.0)
Hemoglobin: 13.5 g/dL (ref 13.0–17.0)
Lymphocytes Relative: 33.9 % (ref 12.0–46.0)
Lymphs Abs: 2.6 10*3/uL (ref 0.7–4.0)
MCHC: 33.7 g/dL (ref 30.0–36.0)
MCV: 94.8 fL (ref 78.0–100.0)
Monocytes Absolute: 0.8 10*3/uL (ref 0.1–1.0)
Monocytes Relative: 9.9 % (ref 3.0–12.0)
Neutro Abs: 4 10*3/uL (ref 1.4–7.7)
Neutrophils Relative %: 52 % (ref 43.0–77.0)
Platelets: 185 10*3/uL (ref 150.0–400.0)
RBC: 4.24 Mil/uL (ref 4.22–5.81)
RDW: 13.9 % (ref 11.5–15.5)
WBC: 7.8 10*3/uL (ref 4.0–10.5)

## 2023-02-17 LAB — COMPREHENSIVE METABOLIC PANEL
ALT: 20 U/L (ref 0–53)
AST: 24 U/L (ref 0–37)
Albumin: 4 g/dL (ref 3.5–5.2)
Alkaline Phosphatase: 63 U/L (ref 39–117)
BUN: 31 mg/dL — ABNORMAL HIGH (ref 6–23)
CO2: 26 meq/L (ref 19–32)
Calcium: 9.2 mg/dL (ref 8.4–10.5)
Chloride: 106 meq/L (ref 96–112)
Creatinine, Ser: 1.37 mg/dL (ref 0.40–1.50)
GFR: 45.25 mL/min — ABNORMAL LOW (ref >60.00)
Glucose, Bld: 122 mg/dL — ABNORMAL HIGH (ref 70–99)
Potassium: 3.9 meq/L (ref 3.5–5.1)
Sodium: 141 meq/L (ref 135–145)
Total Bilirubin: 0.3 mg/dL (ref 0.2–1.2)
Total Protein: 6.4 g/dL (ref 6.0–8.3)

## 2023-02-17 LAB — HEMOGLOBIN A1C: Hgb A1c MFr Bld: 6.4 % (ref 4.6–6.5)

## 2023-02-17 NOTE — Patient Instructions (Addendum)
 Please stop by lab before you go If you have mychart- we will send your results within 3 business days of us  receiving them.  If you do not have mychart- we will call you about results within 5 business days of us  receiving them.  *please also note that you will see labs on mychart as soon as they post. I will later go in and write notes on them- will say notes from Dr. Katrinka   No changes today unless labs lead us  to make changes OTHER than restarting spironolactone  with weight and swelling up some- if doesn't trend down let me know- may need dose of lasix /furosemide   Recommended follow up: Return in about 4 months (around 06/17/2023) for followup or sooner if needed.Schedule b4 you leave.

## 2023-02-17 NOTE — Progress Notes (Signed)
 Phone [phone] In person visit   Subjective:   Joseph Malone is a 89 y.o. year old very pleasant male patient who presents for/with See problem oriented charting Chief Complaint  Patient presents with   Medical Management of Chronic Issues   Diabetes   Hyperlipidemia    Past Medical History-  Patient Active Problem List   Diagnosis Date Noted   Multiple gastric ulcers 12/15/2022    Priority: High   GI bleeding 01/13/2022    Priority: High   Left ventricular thrombus 01/13/2022    Priority: High   Chronic systolic heart failure (HCC) 12/09/2010    Priority: High   Ischemic cardiomyopathy 10/02/2009    Priority: High   ICD (implantable cardioverter-defibrillator) in place 01/16/2009    Priority: High   Non-insulin  dependent type 2 diabetes mellitus (HCC) 09/14/2008    Priority: High   CAD in native artery 09/25/2006    Priority: High   Esophageal stricture 12/15/2022    Priority: Medium    CKD stage 3b, GFR 30-44 ml/min (HCC) 12/05/2022    Priority: Medium    Atherosclerosis of aorta (HCC) 05/18/2020    Priority: Medium    Nephrolithiasis 03/24/2016    Priority: Medium    BPH (benign prostatic hyperplasia) 10/22/2007    Priority: Medium    Hyperlipidemia associated with type 2 diabetes mellitus (HCC) 04/22/2007    Priority: Medium    Essential hypertension 09/25/2006    Priority: Medium    Polyp of ascending colon 01/16/2022    Priority: Low   PVC's (premature ventricular contractions) 01/24/2020    Priority: Low   Long term (current) use of anticoagulants 07/08/2017    Priority: Low   Erectile dysfunction 01/19/2014    Priority: Low   Malignant basal cell neoplasm of skin 09/29/2012    Priority: Low   Solitary pulmonary nodule 07/12/2012    Priority: Low   Osteoarthritis 09/25/2006    Priority: Low   Hematuria 09/25/2006    Priority: Low   Duodenal ulcer 12/15/2022   COVID-19 virus infection 12/07/2022   Acute cystitis 12/05/2022   Generalized  weakness 12/05/2022    Medications- reviewed and updated Current Outpatient Medications  Medication Sig Dispense Refill   aspirin  81 MG tablet Take 1 tablet (81 mg total) by mouth every evening.     folic acid  (FOLVITE ) 400 MCG tablet Take 400 mcg by mouth daily.     Glucosamine HCl 1500 MG TABS Take 1,500 mg by mouth every evening.      Melatonin 5 MG CHEW Chew 1 tablet by mouth daily as needed (sleep).     metFORMIN  (GLUCOPHAGE -XR) 500 MG 24 hr tablet TAKE 1 TABLET(500 MG) BY MOUTH DAILY WITH BREAKFAST 90 tablet 3   Multiple Vitamin (MULTIVITAMIN WITH MINERALS) TABS Take 1 tablet by mouth daily.      omeprazole  (PRILOSEC) 40 MG capsule Take 1 capsule (40 mg total) by mouth daily. 90 capsule 3   simvastatin  (ZOCOR ) 40 MG tablet Take 0.5 tablets (20 mg total) by mouth at bedtime.     tamsulosin  (FLOMAX ) 0.4 MG CAPS capsule Take 0.4 mg by mouth at bedtime.     vitamin B-12 (CYANOCOBALAMIN ) 100 MCG tablet Take 100 mcg by mouth daily.     warfarin (COUMADIN ) 5 MG tablet TAKE 1 TABLET BY MOUTH DAILY EXCEPT TAKE 1/2 TABLET ON MONDAY OR AS DIRECTED BY ANTICOAGULATION CLINIC 105 tablet 1   nitroGLYCERIN  (NITROSTAT ) 0.4 MG SL tablet Place 1 tablet (0.4 mg total) under the  tongue every 5 (five) minutes as needed for chest pain. 1 tablet every 5 minutes for 3 dose only (Patient not taking: Reported on 02/17/2023) 90 tablet 3   spironolactone  (ALDACTONE ) 25 MG tablet TAKE 1/2 TABLET BY MOUTH DAILY (Patient not taking: Reported on 02/17/2023) 90 tablet 3   No current facility-administered medications for this visit.     Objective:  BP 130/72   Pulse 84   Temp (!) 97.2 F (36.2 C)   Ht 5' 11 (1.803 m)   Wt 220 lb 12.8 oz (100.2 kg)   SpO2 95%   BMI 30.80 kg/m  Gen: NAD, resting comfortably CV: RRR no murmurs rubs or gallops Lungs: CTAB no crackles, wheeze, rhonchi Ext: no edema Skin: warm, dry     Assessment and Plan   # ED follow-up-patient was seen in November with hematuria in the  emergency department-was determined to likely be recurrent nephrolithiasis.  Urine culture was negative/no growth.  He was encouraged to hydrate more aggressively and continue tamsulosin .  He was recommended to follow-up with his PCP and urology- has seen urology in late November- no further workup was needed  -Of note earlier in November patient had COVID-pneumonia-follow-up x-ray was reassuring on 12/24/2022  #small wart on face- discussed risks of salicylic acid on face- he may try duct tape  #Cough since COVID- tried some OTC (available over the counter without a prescription) medicine robitussen and wanted to make sure safe with coumadin . Appears to be low risk with coumadin  with interaction checker  #Heart failure with reduced ejection fraction/ ischemic cardiomypopathy- Dr. Cherrie #hypertension S: Medication:Lasix  40 mg as needed (takes potassium  with Lasix ), spironolactone  12.5 mg  at night- has taken a week off -prior losartan  25 mg -prior Carvedilol  3.125 mg twice daily,  -EF 07/25/22 of 30-35%   stable  Edema: mild increases Weight gain:yes but with holidays- may need to cut back on white breads etc Shortness of breath: no increase Home blood pressure usually 110s to 120s- some into 100s or 130s  A/P:  CHF - weight and edema up- may have mild fluid overload- encouraged to restart spironolactone  and let us  know if not improving Hypertension - controlled with basically no medicine as off spironolactone  for a week- only going to restart half  #CAD with defibrillator in place/hyperlipidemia #statin myalgia on higher doser #Apical thrombus-on Coumadin  through cardiology clinic S: Medication:Aspirin  81 mg, simvastatin  40 mg (half of 40 mg)  -imdur  15 mg caused low blood pressure into 80's with weakness and headache(s)  -mild but no increase in chest pain with stable angina Lab Results  Component Value Date   CHOL 139 10/16/2022   HDL 56.40 10/16/2022   LDLCALC 65 10/16/2022    LDLDIRECT 75.0 06/06/2022   TRIG 88.0 10/16/2022   CHOLHDL 2 10/16/2022   A/P: coronary artery disease with stable angina- continue to monitor - continue current medications  Lipds at goal- continue current medications - thankfully at goal even with lower dose due to myalgia on higher dose  # Diabetes S: Medication:metformin  500 mg xr CBGs- Home readings have been between 130s to 160 or so- up perhaps 10 on average from last viist Exercise and diet- has loosened diet up over the holidays Lab Results  Component Value Date   HGBA1C 6.1 10/16/2022   HGBA1C 5.9 (A) 06/02/2022   HGBA1C 6.2 01/30/2022  A/P: hopefully stable- update a1c today. Continue current meds for now - may need to go to twice a day or 1000  mg once a day if a1c above 7  #BPH- on tamsulosin  0.4 mg -Felt more nocturia taking at night and trialed in the daytime and helped some   #Melena history-history of gastric erosions later requiring cauterization-on omeprazole  for 3 months in 2020 -Later with recurrence and multiple gastric ulcers and Humira 2024 he was placed on omeprazole  40 mg twice daily for 1 month with plan for 40 mg indefinitely-we opted out of repeat GI consult  #CKD III noted- update CMP today  Recommended follow up: Return in about 4 months (around 06/17/2023) for followup or sooner if needed.Schedule b4 you leave. Future Appointments  Date Time Provider Department Center  03/04/2023 10:30 AM [facility] COUMADIN  CLINIC [facility]  05/01/2023  3:35 PM [facility] DEVICE REMOTES [facility]  07/31/2023  3:35 PM [facility] DEVICE REMOTES [facility]  08/27/2023  3:00 PM [facility] ANNUAL WELLNESS VISIT 1 [facility]    Lab/Order associations:   ICD-10-CM   1. Non-insulin  dependent type 2 diabetes mellitus (HCC)  E11.9     2. Essential hypertension  I10     3. Hyperlipidemia, unspecified hyperlipidemia type  E78.5     4. Chronic systolic heart failure (HCC) Chronic I50.22     5.  CKD stage 3b, GFR 30-44 ml/min (HCC) Chronic N18.32     6. Hyperlipidemia associated with type 2 diabetes mellitus (HCC) Chronic E11.69    E78.5       No orders of the defined types were placed in this encounter.   Return precautions advised.  Garnette Lukes, MD

## 2023-03-04 ENCOUNTER — Ambulatory Visit (INDEPENDENT_AMBULATORY_CARE_PROVIDER_SITE_OTHER): Payer: Medicare Other

## 2023-03-04 DIAGNOSIS — Z7901 Long term (current) use of anticoagulants: Secondary | ICD-10-CM

## 2023-03-04 LAB — POCT INR: INR: 2.7 (ref 2.0–3.0)

## 2023-03-04 NOTE — Progress Notes (Signed)
I have reviewed and agree with note, evaluation, plan.    , MD  

## 2023-03-04 NOTE — Progress Notes (Signed)
Continue 1 tablet daily except 1/2 tablet on Mondays. Recheck in 6 weeks.

## 2023-03-04 NOTE — Patient Instructions (Addendum)
Pre visit review using our clinic review tool, if applicable. No additional management support is needed unless otherwise documented below in the visit note.  Continue 1 tablet daily except 1/2 tablet on Mondays. Recheck in 6 weeks.

## 2023-04-07 ENCOUNTER — Other Ambulatory Visit (HOSPITAL_COMMUNITY): Payer: Self-pay

## 2023-04-07 DIAGNOSIS — E785 Hyperlipidemia, unspecified: Secondary | ICD-10-CM

## 2023-04-09 ENCOUNTER — Other Ambulatory Visit (HOSPITAL_COMMUNITY): Payer: Self-pay

## 2023-04-09 DIAGNOSIS — E785 Hyperlipidemia, unspecified: Secondary | ICD-10-CM

## 2023-04-09 MED ORDER — SIMVASTATIN 40 MG PO TABS: 20.0000 mg | ORAL_TABLET | Freq: Every day | ORAL | 0 refills | Status: DC

## 2023-04-13 ENCOUNTER — Other Ambulatory Visit: Payer: Self-pay

## 2023-04-13 ENCOUNTER — Encounter (HOSPITAL_COMMUNITY): Payer: Self-pay | Admitting: Internal Medicine

## 2023-04-13 ENCOUNTER — Inpatient Hospital Stay (HOSPITAL_COMMUNITY)
Admission: EM | Admit: 2023-04-13 | Discharge: 2023-04-18 | DRG: 393 | Disposition: A | Discharge status: Home / Self Care | Attending: Family Medicine | Admitting: Family Medicine

## 2023-04-13 DIAGNOSIS — I255 Ischemic cardiomyopathy: Secondary | ICD-10-CM | POA: Diagnosis not present

## 2023-04-13 DIAGNOSIS — K3189 Other diseases of stomach and duodenum: Secondary | ICD-10-CM | POA: Diagnosis not present

## 2023-04-13 DIAGNOSIS — K317 Polyp of stomach and duodenum: Secondary | ICD-10-CM | POA: Diagnosis not present

## 2023-04-13 DIAGNOSIS — K648 Other hemorrhoids: Secondary | ICD-10-CM | POA: Diagnosis not present

## 2023-04-13 DIAGNOSIS — K449 Diaphragmatic hernia without obstruction or gangrene: Secondary | ICD-10-CM | POA: Diagnosis not present

## 2023-04-13 DIAGNOSIS — I5022 Chronic systolic (congestive) heart failure: Secondary | ICD-10-CM | POA: Diagnosis not present

## 2023-04-13 DIAGNOSIS — K5731 Diverticulosis of large intestine without perforation or abscess with bleeding: Secondary | ICD-10-CM

## 2023-04-13 DIAGNOSIS — N182 Chronic kidney disease, stage 2 (mild): Secondary | ICD-10-CM | POA: Diagnosis present

## 2023-04-13 DIAGNOSIS — Z7982 Long term (current) use of aspirin: Secondary | ICD-10-CM | POA: Diagnosis not present

## 2023-04-13 DIAGNOSIS — Z8711 Personal history of peptic ulcer disease: Secondary | ICD-10-CM

## 2023-04-13 DIAGNOSIS — K5140 Inflammatory polyps of colon without complications: Secondary | ICD-10-CM | POA: Diagnosis not present

## 2023-04-13 DIAGNOSIS — Z8249 Family history of ischemic heart disease and other diseases of the circulatory system: Secondary | ICD-10-CM

## 2023-04-13 DIAGNOSIS — K921 Melena: Principal | ICD-10-CM | POA: Diagnosis present

## 2023-04-13 DIAGNOSIS — Z7901 Long term (current) use of anticoagulants: Secondary | ICD-10-CM | POA: Diagnosis not present

## 2023-04-13 DIAGNOSIS — E785 Hyperlipidemia, unspecified: Secondary | ICD-10-CM | POA: Diagnosis present

## 2023-04-13 DIAGNOSIS — Z7984 Long term (current) use of oral hypoglycemic drugs: Secondary | ICD-10-CM

## 2023-04-13 DIAGNOSIS — I2510 Atherosclerotic heart disease of native coronary artery without angina pectoris: Secondary | ICD-10-CM | POA: Diagnosis not present

## 2023-04-13 DIAGNOSIS — K5730 Diverticulosis of large intestine without perforation or abscess without bleeding: Secondary | ICD-10-CM | POA: Diagnosis not present

## 2023-04-13 DIAGNOSIS — K222 Esophageal obstruction: Secondary | ICD-10-CM | POA: Diagnosis present

## 2023-04-13 DIAGNOSIS — K254 Chronic or unspecified gastric ulcer with hemorrhage: Secondary | ICD-10-CM | POA: Diagnosis not present

## 2023-04-13 DIAGNOSIS — K5790 Diverticulosis of intestine, part unspecified, without perforation or abscess without bleeding: Secondary | ICD-10-CM | POA: Diagnosis not present

## 2023-04-13 DIAGNOSIS — Z951 Presence of aortocoronary bypass graft: Secondary | ICD-10-CM

## 2023-04-13 DIAGNOSIS — K2961 Other gastritis with bleeding: Secondary | ICD-10-CM | POA: Diagnosis not present

## 2023-04-13 DIAGNOSIS — K625 Hemorrhage of anus and rectum: Secondary | ICD-10-CM | POA: Diagnosis not present

## 2023-04-13 DIAGNOSIS — Z79899 Other long term (current) drug therapy: Secondary | ICD-10-CM | POA: Diagnosis not present

## 2023-04-13 DIAGNOSIS — K219 Gastro-esophageal reflux disease without esophagitis: Secondary | ICD-10-CM | POA: Diagnosis present

## 2023-04-13 DIAGNOSIS — I130 Hypertensive heart and chronic kidney disease with heart failure and stage 1 through stage 4 chronic kidney disease, or unspecified chronic kidney disease: Secondary | ICD-10-CM | POA: Diagnosis present

## 2023-04-13 DIAGNOSIS — I252 Old myocardial infarction: Secondary | ICD-10-CM

## 2023-04-13 DIAGNOSIS — K2951 Unspecified chronic gastritis with bleeding: Secondary | ICD-10-CM | POA: Diagnosis not present

## 2023-04-13 DIAGNOSIS — K5733 Diverticulitis of large intestine without perforation or abscess with bleeding: Secondary | ICD-10-CM | POA: Diagnosis not present

## 2023-04-13 DIAGNOSIS — I5042 Chronic combined systolic (congestive) and diastolic (congestive) heart failure: Secondary | ICD-10-CM | POA: Diagnosis not present

## 2023-04-13 DIAGNOSIS — D6832 Hemorrhagic disorder due to extrinsic circulating anticoagulants: Secondary | ICD-10-CM | POA: Diagnosis not present

## 2023-04-13 DIAGNOSIS — Z95810 Presence of automatic (implantable) cardiac defibrillator: Secondary | ICD-10-CM | POA: Diagnosis not present

## 2023-04-13 DIAGNOSIS — E1122 Type 2 diabetes mellitus with diabetic chronic kidney disease: Secondary | ICD-10-CM | POA: Diagnosis not present

## 2023-04-13 DIAGNOSIS — D125 Benign neoplasm of sigmoid colon: Principal | ICD-10-CM

## 2023-04-13 DIAGNOSIS — Z801 Family history of malignant neoplasm of trachea, bronchus and lung: Secondary | ICD-10-CM

## 2023-04-13 DIAGNOSIS — K649 Unspecified hemorrhoids: Secondary | ICD-10-CM | POA: Diagnosis not present

## 2023-04-13 DIAGNOSIS — K259 Gastric ulcer, unspecified as acute or chronic, without hemorrhage or perforation: Secondary | ICD-10-CM | POA: Diagnosis not present

## 2023-04-13 DIAGNOSIS — Z833 Family history of diabetes mellitus: Secondary | ICD-10-CM | POA: Diagnosis not present

## 2023-04-13 DIAGNOSIS — D120 Benign neoplasm of cecum: Secondary | ICD-10-CM | POA: Diagnosis not present

## 2023-04-13 DIAGNOSIS — N400 Enlarged prostate without lower urinary tract symptoms: Secondary | ICD-10-CM | POA: Diagnosis present

## 2023-04-13 DIAGNOSIS — K922 Gastrointestinal hemorrhage, unspecified: Secondary | ICD-10-CM | POA: Diagnosis present

## 2023-04-13 DIAGNOSIS — R000 Tachycardia, unspecified: Secondary | ICD-10-CM | POA: Diagnosis not present

## 2023-04-13 DIAGNOSIS — K635 Polyp of colon: Secondary | ICD-10-CM

## 2023-04-13 DIAGNOSIS — Z880 Allergy status to penicillin: Secondary | ICD-10-CM

## 2023-04-13 DIAGNOSIS — K920 Hematemesis: Secondary | ICD-10-CM | POA: Diagnosis not present

## 2023-04-13 LAB — CBC
HCT: 43.8 % (ref 39.0–52.0)
Hemoglobin: 14.3 g/dL (ref 13.0–17.0)
MCH: 31 pg (ref 26.0–34.0)
MCHC: 32.6 g/dL (ref 30.0–36.0)
MCV: 94.8 fL (ref 80.0–100.0)
Platelets: 154 10*3/uL (ref 150–400)
RBC: 4.62 MIL/uL (ref 4.22–5.81)
RDW: 12.4 % (ref 11.5–15.5)
WBC: 9 10*3/uL (ref 4.0–10.5)
nRBC: 0 % (ref 0.0–0.2)

## 2023-04-13 LAB — COMPREHENSIVE METABOLIC PANEL
ALT: 18 U/L (ref 0–44)
AST: 23 U/L (ref 15–41)
Albumin: 4 g/dL (ref 3.5–5.0)
Alkaline Phosphatase: 51 U/L (ref 38–126)
Anion gap: 9 (ref 5–15)
BUN: 31 mg/dL — ABNORMAL HIGH (ref 8–23)
CO2: 22 mmol/L (ref 22–32)
Calcium: 9.2 mg/dL (ref 8.9–10.3)
Chloride: 108 mmol/L (ref 98–111)
Creatinine, Ser: 1.48 mg/dL — ABNORMAL HIGH (ref 0.61–1.24)
GFR, Estimated: 44 mL/min — ABNORMAL LOW (ref >60)
Glucose, Bld: 147 mg/dL — ABNORMAL HIGH (ref 70–99)
Potassium: 3.7 mmol/L (ref 3.5–5.1)
Sodium: 139 mmol/L (ref 135–145)
Total Bilirubin: 0.7 mg/dL (ref 0.0–1.2)
Total Protein: 6.7 g/dL (ref 6.5–8.1)

## 2023-04-13 LAB — PROTIME-INR
INR: 2.8 — ABNORMAL HIGH (ref 0.8–1.2)
Prothrombin Time: 29.4 s — ABNORMAL HIGH (ref 11.4–15.2)

## 2023-04-13 LAB — POC OCCULT BLOOD, ED: Fecal Occult Bld: POSITIVE — AB

## 2023-04-13 LAB — TYPE AND SCREEN
ABO/RH(D): O POS
Antibody Screen: NEGATIVE

## 2023-04-13 LAB — CBG MONITORING, ED
Glucose-Capillary: 107 mg/dL — ABNORMAL HIGH (ref 70–99)
Glucose-Capillary: 142 mg/dL — ABNORMAL HIGH (ref 70–99)

## 2023-04-13 LAB — HEMOGLOBIN AND HEMATOCRIT, BLOOD
HCT: 39.9 % (ref 39.0–52.0)
Hemoglobin: 13.2 g/dL (ref 13.0–17.0)

## 2023-04-13 LAB — GLUCOSE, CAPILLARY: Glucose-Capillary: 169 mg/dL — ABNORMAL HIGH (ref 70–99)

## 2023-04-13 MED ORDER — PANTOPRAZOLE SODIUM 40 MG IV SOLR
40.0000 mg | Freq: Two times a day (BID) | INTRAVENOUS | Status: DC
  Administered 2023-04-13 – 2023-04-17 (×9): 40 mg via INTRAVENOUS
  Filled 2023-04-13 (×9): qty 10

## 2023-04-13 MED ORDER — ONDANSETRON HCL 4 MG/2ML IJ SOLN: 4.0000 mg | Freq: Four times a day (QID) | INTRAMUSCULAR | Status: DC | PRN

## 2023-04-13 MED ORDER — PANTOPRAZOLE SODIUM 40 MG IV SOLR: 40.0000 mg | Freq: Every day | INTRAVENOUS | Status: DC

## 2023-04-13 MED ORDER — VITAMIN B-12 100 MCG PO TABS
100.0000 ug | ORAL_TABLET | Freq: Every day | ORAL | Status: DC
  Administered 2023-04-13 – 2023-04-17 (×4): 100 ug via ORAL
  Filled 2023-04-13 (×6): qty 1

## 2023-04-13 MED ORDER — FOLIC ACID 1 MG PO TABS
500.0000 ug | ORAL_TABLET | Freq: Every day | ORAL | Status: DC
  Administered 2023-04-13 – 2023-04-17 (×4): 0.5 mg via ORAL
  Filled 2023-04-13 (×5): qty 1

## 2023-04-13 MED ORDER — ACETAMINOPHEN 650 MG RE SUPP: 650.0000 mg | Freq: Four times a day (QID) | RECTAL | Status: DC | PRN

## 2023-04-13 MED ORDER — SIMVASTATIN 20 MG PO TABS
20.0000 mg | ORAL_TABLET | Freq: Every day | ORAL | Status: DC
  Administered 2023-04-13 – 2023-04-17 (×5): 20 mg via ORAL
  Filled 2023-04-13 (×5): qty 1

## 2023-04-13 MED ORDER — TRAZODONE HCL 50 MG PO TABS
25.0000 mg | ORAL_TABLET | Freq: Every evening | ORAL | Status: DC | PRN
  Administered 2023-04-16: 25 mg via ORAL
  Filled 2023-04-13: qty 1

## 2023-04-13 MED ORDER — ONDANSETRON HCL 4 MG PO TABS: 4.0000 mg | ORAL_TABLET | Freq: Four times a day (QID) | ORAL | Status: DC | PRN

## 2023-04-13 MED ORDER — INSULIN ASPART 100 UNIT/ML IJ SOLN
0.0000 [IU] | Freq: Every day | INTRAMUSCULAR | Status: DC
  Filled 2023-04-13: qty 0.05

## 2023-04-13 MED ORDER — PANTOPRAZOLE SODIUM 40 MG IV SOLR
80.0000 mg | Freq: Once | INTRAVENOUS | Status: AC
  Administered 2023-04-13: 80 mg via INTRAVENOUS
  Filled 2023-04-13: qty 20

## 2023-04-13 MED ORDER — ACETAMINOPHEN 325 MG PO TABS
650.0000 mg | ORAL_TABLET | Freq: Four times a day (QID) | ORAL | Status: DC | PRN
  Administered 2023-04-18: 650 mg via ORAL
  Filled 2023-04-13: qty 2

## 2023-04-13 MED ORDER — INSULIN ASPART 100 UNIT/ML IJ SOLN
0.0000 [IU] | Freq: Three times a day (TID) | INTRAMUSCULAR | Status: DC
  Administered 2023-04-13: 2 [IU] via SUBCUTANEOUS
  Administered 2023-04-14: 3 [IU] via SUBCUTANEOUS
  Administered 2023-04-14: 2 [IU] via SUBCUTANEOUS
  Administered 2023-04-15: 3 [IU] via SUBCUTANEOUS
  Administered 2023-04-16: 2 [IU] via SUBCUTANEOUS
  Administered 2023-04-17: 3 [IU] via SUBCUTANEOUS
  Administered 2023-04-17 (×2): 2 [IU] via SUBCUTANEOUS
  Filled 2023-04-13: qty 0.15

## 2023-04-13 MED ORDER — TAMSULOSIN HCL 0.4 MG PO CAPS
0.4000 mg | ORAL_CAPSULE | Freq: Every day | ORAL | Status: DC
  Administered 2023-04-13 – 2023-04-17 (×5): 0.4 mg via ORAL
  Filled 2023-04-13 (×5): qty 1

## 2023-04-13 MED ORDER — ALBUTEROL SULFATE (2.5 MG/3ML) 0.083% IN NEBU: 2.5000 mg | INHALATION_SOLUTION | RESPIRATORY_TRACT | Status: DC | PRN

## 2023-04-13 NOTE — Plan of Care (Signed)
   Problem: Coping: Goal: Ability to adjust to condition or change in health will improve Outcome: Progressing   Problem: Fluid Volume: Goal: Ability to maintain a balanced intake and output will improve Outcome: Progressing   Problem: Health Behavior/Discharge Planning: Goal: Ability to identify and utilize available resources and services will improve Outcome: Progressing

## 2023-04-13 NOTE — Consult Note (Signed)
 Referring Provider: Dr. Dalene Seltzer EDP Primary Care Physician:  Shelva Majestic, MD Primary Gastroenterologist:  Dr. Adela Lank  Reason for Consultation:  GI bleed  HPI: Joseph Malone is a 89 y.o. male with medical history significant for BPH, CAD status post CABG, chronic combined systolic and diastolic heart failure, ICD, LV thrombus on Coumadin, hypertension, hyperlipidemia, noninsulin-dependent diabetes, CKD stage IIIb being admitted to the hospital with concern for GI bleed.  He says that all last week he was having black stools, but did not really think anything of it.  Then on Saturday when he was cleaning himself he noticed blood on the toilet paper.  This continued yesterday and into this morning so he came to the emergency department for evaluation.  He denies nausea or vomiting.  No abdominal pain.  His bowel movements have had some form to them.  No NSAID use.  Has been on his omeprazole daily.  He describes that he lost about 35 pounds intentionally on a low-carb diet.  He says that the last few days his abdomen has seemed more bloated than it had been since he had lost weight.    Hemoglobin is normal at 14.3 g.  INR 2.8 on Coumadin.  BUN is up slightly at 31, but Cr up a bit too.  Has been placed on pantoprazole 40 mg IV daily.  EGD November 2024 1. Incidental esophageal stricture 2. Hiatal hernia 3. Multiple clean- based gastric and duodenal ulcers as described. Status post biopsies of the gastric antrum.  A. STOMACH, BIOPSY:  - Gastric antral mucosa with no specific histopathologic changes  - Helicobacter pylori-like organisms are not identified on routine HE  stain   Colonoscopy in December 2023 - One 5 mm polyp in the ascending colon, removed with a cold snare. Resected and retrieved. - Diverticulosis in the sigmoid colon and in the descending colon. - Non- bleeding internal hemorrhoids. - The examination was otherwise normal with brown stool, no evidence of recent GI  bleed or active bleed.  A. ASCENDING COLON POLYP, BIOPSY:  - Tubular adenoma  - Negative for high-grade dysplasia or malignancy    Past Medical History:  Diagnosis Date   Anxiety    BPH (benign prostatic hypertrophy)    CAD (coronary artery disease)    s/p CABG   CHF (congestive heart failure) (HCC)    Diabetes 1.5, managed as type 2 (HCC)    Diverticulosis    Diverticulosis of colon without hemorrhage 10/22/2007   Qualifier: Diagnosis of  By: Alwyn Ren MD, Chrissie Noa     DOE (dyspnea on exertion)    Excess weight    Fasting hyperglycemia    Generalized weakness    GERD (gastroesophageal reflux disease)    Heart attack (HCC) 2001   Heart failure    CHF due to ischemic CM   History of kidney stones    Hyperlipidemia    Hypertension    Ischemic cardiomyopathy    Microscopic hematuria    [facility]   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    Past Surgical History:  Procedure Laterality Date   APPENDECTOMY     BIOPSY  10/21/2018   Procedure: BIOPSY;  Surgeon: Benancio Deeds, MD;  Location: [facility];  Service: Gastroenterology;;   BIOPSY  12/15/2022   Procedure: BIOPSY;  Surgeon: Hilarie Fredrickson, MD;  Location: [facility];  Service: Gastroenterology;;   CARDIAC CATHETERIZATION  2000   CARDIAC DEFIBRILLATOR PLACEMENT  2005   [employer]; s/p ICD  gen change and RV lead revision April 2015   CATARACT EXTRACTION  2008   bilateral with lens implant   COLONOSCOPY  2004   Tics; [facility]   COLONOSCOPY WITH PROPOFOL N/A 01/16/2022   Procedure: COLONOSCOPY WITH PROPOFOL;  Surgeon: Napoleon Form, MD;  Location: [facility];  Service: Gastroenterology;  Laterality: N/A;   CORONARY ARTERY BYPASS GRAFT  2000   1 vessel   CYSTOSCOPY  06/2012   Dr Margarita Grizzle   ESOPHAGOGASTRODUODENOSCOPY (EGD) WITH PROPOFOL N/A 10/21/2018   Procedure: ESOPHAGOGASTRODUODENOSCOPY (EGD) WITH PROPOFOL;  Surgeon: Benancio Deeds, MD;  Location: [facility];  Service:  Gastroenterology;  Laterality: N/A;   ESOPHAGOGASTRODUODENOSCOPY (EGD) WITH PROPOFOL N/A 12/15/2022   Procedure: ESOPHAGOGASTRODUODENOSCOPY (EGD) WITH PROPOFOL;  Surgeon: Hilarie Fredrickson, MD;  Location: [facility];  Service: Gastroenterology;  Laterality: N/A;   HOT HEMOSTASIS N/A 10/21/2018   Procedure: HOT HEMOSTASIS (ARGON PLASMA COAGULATION/BICAP);  Surgeon: Benancio Deeds, MD;  Location: [facility];  Service: Gastroenterology;  Laterality: N/A;   IMPLANTABLE CARDIOVERTER DEFIBRILLATOR (ICD) GENERATOR CHANGE N/A 06/01/2013   Procedure: ICD GENERATOR CHANGE;  Surgeon: Marinus Maw, MD;  Location: [facility] C[facility] LAB;  Service: Cardiovascular;  Laterality: N/A;   LEAD REVISION N/A 06/01/2013   Procedure: LEAD REVISION;  Surgeon: Marinus Maw, MD;  Location: [facility] CATH LAB;  Service: Cardiovascular;  Laterality: N/A;   PILONIDAL CYST EXCISION     POLYPECTOMY  01/16/2022   Procedure: POLYPECTOMY;  Surgeon: Napoleon Form, MD;  Location: [facility];  Service: Gastroenterology;;   RIGHT/LEFT HEART CATH AND CORONARY/GRAFT ANGIOGRAPHY N/A 06/12/2017   Procedure: RIGHT/LEFT HEART CATH AND CORONARY/GRAFT ANGIOGRAPHY;  Surgeon: Dolores Patty, MD;  Location: [facility] INVASIVE CV LAB;  Service: Cardiovascular;  Laterality: N/A;   TRANSTHORACIC ECHOCARDIOGRAM  08/29/2005    Prior to Admission medications   Medication Sig Start Date End Date Taking? Authorizing Provider  acetaminophen (TYLENOL) 500 MG tablet Take 500-1,000 mg by mouth at bedtime.   Yes [provider]  aspirin 81 MG tablet Take 1 tablet (81 mg total) by mouth every evening. 12/17/22  Yes Pahwani, Daleen Bo, MD  folic acid (FOLVITE) 400 MCG tablet Take 400 mcg by mouth daily.   Yes [provider]  Glucosamine HCl 1500 MG TABS Take 1,500 mg by mouth every evening.    Yes [provider]  Melatonin 5 MG CHEW Chew 1 tablet by mouth at bedtime.   Yes [provider]  metFORMIN (GLUCOPHAGE-XR) 500 MG 24 hr  tablet TAKE 1 TABLET(500 MG) BY MOUTH DAILY WITH BREAKFAST 05/30/22  Yes Shelva Majestic, MD  Multiple Vitamin (MULTIVITAMIN WITH MINERALS) TABS Take 1 tablet by mouth at bedtime.   Yes [provider]  nitroGLYCERIN (NITROSTAT) 0.4 MG SL tablet Place 1 tablet (0.4 mg total) under the tongue every 5 (five) minutes as needed for chest pain. 1 tablet every 5 minutes for 3 dose only 02/11/22 07/11/23 Yes Milford, Anderson Malta, FNP  omeprazole (PRILOSEC) 40 MG capsule Take 1 capsule (40 mg total) by mouth daily. 12/24/22 12/19/23 Yes Shelva Majestic, MD  simvastatin (ZOCOR) 40 MG tablet Take 0.5 tablets (20 mg total) by mouth at bedtime. 04/09/23  Yes Bensimhon, Bevelyn Buckles, MD  spironolactone (ALDACTONE) 25 MG tablet TAKE 1/2 TABLET BY MOUTH DAILY Patient taking differently: Take 12.5 mg by mouth daily as needed (Weight gain/Swelling). 12/11/22  Yes Shelva Majestic, MD  tamsulosin (FLOMAX) 0.4 MG CAPS capsule Take 0.4 mg by mouth at bedtime. 12/13/19  Yes [provider]  vitamin B-12 (CYANOCOBALAMIN) 100 MCG tablet Take 100 mcg by mouth daily.   Yes [provider]  warfarin (COUMADIN) 5 MG tablet TAKE 1 TABLET BY MOUTH DAILY EXCEPT TAKE 1/2 TABLET ON MONDAY OR AS DIRECTED BY ANTICOAGULATION CLINIC 01/22/23  Yes Shelva Majestic, MD    Current Facility-Administered Medications  Medication Dose Route Frequency Provider Last Rate Last Admin   acetaminophen (TYLENOL) tablet 650 mg  650 mg Oral Q6H PRN Kirby Crigler, Mir M, MD       Or   acetaminophen (TYLENOL) suppository 650 mg  650 mg Rectal Q6H PRN Kirby Crigler, Mir M, MD       albuterol (PROVENTIL) (2.5 MG/3ML) 0.083% nebulizer solution 2.5 mg  2.5 mg Nebulization Q2H PRN Kirby Crigler, Mir M, MD       folic acid (FOLVITE) tablet 0.5 mg  500 mcg Oral Daily Kirby Crigler, Mir M, MD   0.5 mg at 04/13/23 1253   insulin aspart (novoLOG) injection 0-15 Units  0-15 Units Subcutaneous TID WC Kirby Crigler, Mir M, MD       insulin aspart (novoLOG)  injection 0-5 Units  0-5 Units Subcutaneous QHS Kirby Crigler, Mir M, MD       ondansetron [facility]) tablet 4 mg  4 mg Oral Q6H PRN Kirby Crigler, Mir M, MD       Or   ondansetron (ZOFRAN) injection 4 mg  4 mg Intravenous Q6H PRN Kirby Crigler, Mir M, MD       pantoprazole (PROTONIX) injection 40 mg  40 mg Intravenous Daily Kirby Crigler, Mir M, MD       simvastatin (ZOCOR) tablet 20 mg  20 mg Oral QHS Kirby Crigler, Mir M, MD       tamsulosin (FLOMAX) capsule 0.4 mg  0.4 mg Oral QHS Kirby Crigler, Mir M, MD       traZODone (DESYREL) tablet 25 mg  25 mg Oral QHS PRN Kirby Crigler, Mir M, MD       vitamin B-12 (CYANOCOBALAMIN) tablet 100 mcg  100 mcg Oral Daily Kirby Crigler, Mir M, MD   100 mcg at 04/13/23 1253   Current Outpatient Medications  Medication Sig Dispense Refill   acetaminophen (TYLENOL) 500 MG tablet Take 500-1,000 mg by mouth at bedtime.     aspirin 81 MG tablet Take 1 tablet (81 mg total) by mouth every evening.     folic acid (FOLVITE) 400 MCG tablet Take 400 mcg by mouth daily.     Glucosamine HCl 1500 MG TABS Take 1,500 mg by mouth every evening.      Melatonin 5 MG CHEW Chew 1 tablet by mouth at bedtime.     metFORMIN (GLUCOPHAGE-XR) 500 MG 24 hr tablet TAKE 1 TABLET(500 MG) BY MOUTH DAILY WITH BREAKFAST 90 tablet 3   Multiple Vitamin (MULTIVITAMIN WITH MINERALS) TABS Take 1 tablet by mouth at bedtime.     nitroGLYCERIN (NITROSTAT) 0.4 MG SL tablet Place 1 tablet (0.4 mg total) under the tongue every 5 (five) minutes as needed for chest pain. 1 tablet every 5 minutes for 3 dose only 90 tablet 3   omeprazole (PRILOSEC) 40 MG capsule Take 1 capsule (40 mg total) by mouth daily. 90 capsule 3   simvastatin (ZOCOR) 40 MG tablet Take 0.5 tablets (20 mg total) by mouth at bedtime. 45 tablet 0   spironolactone (ALDACTONE) 25 MG tablet TAKE 1/2 TABLET BY MOUTH DAILY (Patient taking differently: Take 12.5 mg by mouth daily as needed (Weight gain/Swelling).) 90 tablet 3   tamsulosin (FLOMAX) 0.4 MG  CAPS  capsule Take 0.4 mg by mouth at bedtime.     vitamin B-12 (CYANOCOBALAMIN) 100 MCG tablet Take 100 mcg by mouth daily.     warfarin (COUMADIN) 5 MG tablet TAKE 1 TABLET BY MOUTH DAILY EXCEPT TAKE 1/2 TABLET ON MONDAY OR AS DIRECTED BY ANTICOAGULATION CLINIC 105 tablet 1    Allergies as of 04/13/2023 - Review Complete 04/13/2023  Allergen Reaction Noted   Penicillins Rash and Other (See Comments)     Family History  Problem Relation Age of Onset   Hypertension Father    Heart attack Father 12   Diabetes Brother        X 2   Nephrolithiasis Brother    Heart attack Brother 43   Heart attack Paternal Uncle 25   Sudden death Paternal Grandfather 66       ? heat stroke   Lung cancer Sister        "Asian lung cancer"   Stroke Neg Hx    Colon cancer Neg Hx    Esophageal cancer Neg Hx    Rectal cancer Neg Hx    Stomach cancer Neg Hx     Social History   Socioeconomic History   Marital status: Married    Spouse name: Not on file   Number of children: 4   Years of education: Not on file   Highest education level: Not on file  Occupational History   Occupation: Retired  Tobacco Use   Smoking status: Never    Passive exposure: Yes   Smokeless tobacco: Never  Vaping Use   Vaping status: Never Used  Substance and Sexual Activity   Alcohol use: No   Drug use: No   Sexual activity: Never  Other Topics Concern   Not on file  Social History Narrative   Married 1957. 4 children 2 boys 2 girls. 15 grandkids.  4 greatgrandkids.       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       Hobbies: genealogy, travel      No HCPOA-advised to do this.    Social Drivers of Corporate investment banker Strain: Low Risk  (08/21/2022)   Overall Financial Resource Strain (CARDIA)    Difficulty of Paying Living Expenses: Not hard at all  Food Insecurity: No Food Insecurity (12/18/2022)   Hunger Vital Sign    Worried About Running Out of Food in the  Last Year: Never true    Ran Out of Food in the Last Year: Never true  Transportation Needs: No Transportation Needs (12/18/2022)   PRAPARE - Administrator, Civil Service (Medical): No    Lack of Transportation (Non-Medical): No  Physical Activity: Insufficiently Active (08/21/2022)   Exercise Vital Sign    Days of Exercise per Week: 2 days    Minutes of Exercise per Session: 50 min  Stress: No Stress Concern Present (08/21/2022)   [employer] of Occupational Health - Occupational Stress Questionnaire    Feeling of Stress : Not at all  Social Connections: Socially Integrated (08/21/2022)   Social Connection and Isolation Panel [NHANES]    Frequency of Communication with Friends and Family: More than three times a week    Frequency of Social Gatherings with Friends and Family: More than three times a week    Attends Religious Services: More than 4 times per year    Active Member of [employer] or Organizations: Yes    Attends [employer]  or Organization Meetings: 1 to 4 times per year    Marital Status: Married  Catering manager Violence: Not At Risk (12/15/2022)   Humiliation, Afraid, Rape, and Kick questionnaire    Fear of Current or Ex-Partner: No    Emotionally Abused: No    Physically Abused: No    Sexually Abused: No    Review of Systems: ROS is O/W negative except as mentioned in HPI.  Physical Exam: Vital signs in last 24 hours: Temp:  [97.7 F (36.5 C)-97.9 F (36.6 C)] 97.9 F (36.6 C) (03/03 1316) Pulse Rate:  [63-97] 63 (03/03 1316) Resp:  [16-18] 16 (03/03 1316) BP: (111-155)/(74-85) 155/74 (03/03 1316) SpO2:  [96 %-99 %] 96 % (03/03 1316) Weight:  [97.5 kg] 97.5 kg (03/03 0747)   General:  Alert, Well-developed, well-nourished, pleasant and cooperative in NAD Head:  Normocephalic and atraumatic. Eyes:  Sclera clear, no icterus.  Conjunctiva pink. Ears:  Normal auditory acuity. Mouth:  No deformity or lesions.   Lungs:  Clear throughout to auscultation.   No wheezes, crackles, or rhonchi.  Heart:  Regular rate and rhythm; no murmurs, clicks, rubs, or gallops. Abdomen:  Soft, non-distended.  BS present.  Non-tender.   Rectal: No external hemorrhoids noted.  He had dark maroon stool noted all over the skin in the perianal area.  DRE revealed dark maroon-colored stool. Msk:  Symmetrical without gross deformities. Pulses:  Normal pulses noted. Extremities:  Without clubbing or edema. Neurologic:  Alert and oriented x 4;  grossly normal neurologically. Skin:  Intact without significant lesions or rashes. Psych:  Alert and cooperative. Normal mood and affect.  Lab Results: Recent Labs    04/13/23 0802  WBC 9.0  HGB 14.3  HCT 43.8  PLT 154   BMET Recent Labs    04/13/23 0802  NA 139  K 3.7  CL 108  CO2 22  GLUCOSE 147*  BUN 31*  CREATININE 1.48*  CALCIUM 9.2   LFT Recent Labs    04/13/23 0802  PROT 6.7  ALBUMIN 4.0  AST 23  ALT 18  ALKPHOS 51  BILITOT 0.7   PT/INR Recent Labs    04/13/23 0802  LABPROT 29.4*  INR 2.8*   IMPRESSION:  *GI bleed: Started out with black stools for the past week and then started seeing maroon blood on the toilet paper.  Has maroon stools on exam today.  Concern for ulcers versus AVM versus possibly diverticular bleed, with the black stools I think less likely.  Hgb normal. *LV thrombus on Coumadin: INR 2.8  PLAN: -Will plan for EGD/enteroscopy and flexible sigmoidoscopy on 3/4. -Monitor Hgb and transfuse prn. -He has already been placed on pantoprazole 40 mg daily, but I am going to increase that to twice daily for now. -Orders are already in for INR to be rechecked in the morning.   Princella Pellegrini. Odelle Kosier  04/13/2023, 1:44 PM

## 2023-04-13 NOTE — ED Triage Notes (Signed)
 Pt arrived via POV. C/o dark stools with blood that began yesterday.  Hx bleeding ulcer, on warfarin.  No abd pain.  [id number]

## 2023-04-13 NOTE — ED Provider Notes (Signed)
 [place] EMERGENCY DEPARTMENT AT [facility] Provider Note   CSN: [id number] Arrival date & time: 04/13/23  0715     History  Chief Complaint  Patient presents with   Rectal Bleeding    ANICETO KYSER is a 89 y.o. male.  HPI    89 year old male with a history of coronary artery disease, congestive heart failure with reduced ejection fraction who sees Dr. Gala Romney, hypertension, hyperlipidemia, diabetes, history of gastric ulcers who presents with concern for rectal bleeding.    Has history of gastric erosions that later required cauterization in 2020, had recurrence of gastric ulcers in 2024  2 stool a day, black stool (no pepto bismol) 1 week of this, last 2 days noted blood, mostly Sunday/yesterday, more like dark blood/maroon colored blood Ate something greasy and stomach felt a little off No nausea, no vomiting No abdominal pain, just low appetite feeling No fevers No diarrhea, just loose. No fevers Didn't take coumadin today (normally take it in the morning)   Lost 30-40lb low carb diet  Is taking prilosec//omeprazole in AM   [facility]  Past Medical History:  Diagnosis Date   Anxiety    BPH (benign prostatic hypertrophy)    CAD (coronary artery disease)    s/p CABG   CHF (congestive heart failure) (HCC)    Diabetes 1.5, managed as type 2 (HCC)    Diverticulosis    Diverticulosis of colon without hemorrhage 10/22/2007   Qualifier: Diagnosis of  By: Alwyn Ren MD, Chrissie Noa     DOE (dyspnea on exertion)    Excess weight    Fasting hyperglycemia    Generalized weakness    GERD (gastroesophageal reflux disease)    Heart attack (HCC) 2001   Heart failure    CHF due to ischemic CM   History of kidney stones    Hyperlipidemia    Hypertension    Ischemic cardiomyopathy    Microscopic hematuria    [facility]   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 Authorizing Provider  acetaminophen (TYLENOL) 500 MG tablet Take 500-1,000 mg by mouth at bedtime.   Yes [provider]  aspirin 81 MG tablet Take 1 tablet (81 mg total) by mouth every evening. 12/17/22  Yes Pahwani, Daleen Bo, MD  folic acid (FOLVITE) 400 MCG tablet Take 400 mcg by mouth daily.   Yes [provider]  Glucosamine HCl 1500 MG TABS Take 1,500 mg by mouth every evening.    Yes [provider]  Melatonin 5 MG CHEW Chew 1 tablet by mouth at bedtime.   Yes [provider]  metFORMIN (GLUCOPHAGE-XR) 500 MG 24 hr tablet TAKE 1 TABLET(500 MG) BY MOUTH DAILY WITH BREAKFAST 05/30/22  Yes Shelva Majestic, MD  Multiple Vitamin (MULTIVITAMIN WITH MINERALS) TABS Take 1 tablet by mouth at bedtime.   Yes [provider]  nitroGLYCERIN (NITROSTAT) 0.4 MG SL tablet Place 1 tablet (0.4 mg total) under the tongue every 5 (five) minutes as needed for chest pain. 1 tablet every 5 minutes for 3 dose only 02/11/22 07/11/23 Yes Milford, Anderson Malta, FNP  omeprazole (PRILOSEC) 40 MG capsule Take 1 capsule (40 mg total) by mouth daily. 12/24/22 12/19/23 Yes Shelva Majestic, MD  simvastatin (ZOCOR) 40 MG tablet Take 0.5 tablets (20 mg total) by mouth at bedtime. 04/09/23  Yes Bensimhon, Bevelyn Buckles, MD  spironolactone (ALDACTONE) 25  MG tablet TAKE 1/2 TABLET BY MOUTH DAILY Patient taking differently: Take 12.5 mg by mouth daily as needed (Weight gain/Swelling). 12/11/22  Yes Shelva Majestic, MD  tamsulosin (FLOMAX) 0.4 MG CAPS capsule Take 0.4 mg by mouth at bedtime. 12/13/19  Yes [provider]  vitamin B-12 (CYANOCOBALAMIN) 100 MCG tablet Take 100 mcg by mouth daily.   Yes [provider]  warfarin (COUMADIN) 5 MG tablet TAKE 1 TABLET BY MOUTH DAILY EXCEPT TAKE 1/2 TABLET ON MONDAY OR AS DIRECTED BY ANTICOAGULATION CLINIC 01/22/23  Yes Shelva Majestic, MD      Allergies    Penicillins    Review of Systems   Review of  Systems  Physical Exam Updated Vital Signs BP (!) 143/79 (BP Location: Right Arm)   Pulse 81   Temp 98.1 F (36.7 C) (Oral)   Resp 18   Ht 5\' 11"  (1.803 m)   Wt 96.3 kg   SpO2 96%   BMI 29.61 kg/m  Physical Exam Vitals and nursing note reviewed.  Constitutional:      General: He is not in acute distress.    Appearance: He is well-developed. He is not diaphoretic.  HENT:     Head: Normocephalic and atraumatic.  Eyes:     Conjunctiva/sclera: Conjunctivae normal.  Cardiovascular:     Rate and Rhythm: Normal rate and regular rhythm.     Heart sounds: Normal heart sounds. No murmur heard.    No friction rub. No gallop.  Pulmonary:     Effort: Pulmonary effort is normal. No respiratory distress.     Breath sounds: Normal breath sounds. No wheezing or rales.  Abdominal:     General: There is no distension.     Palpations: Abdomen is soft.     Tenderness: There is no abdominal tenderness. There is no guarding.  Musculoskeletal:     Cervical back: Normal range of motion.  Skin:    General: Skin is warm and dry.  Neurological:     Mental Status: He is alert and oriented to person, place, and time.     ED Results / Procedures / Treatments   Labs (all labs ordered are listed, but only abnormal results are displayed) Labs Reviewed  COMPREHENSIVE METABOLIC PANEL - Abnormal; Notable for the following components:      Result Value   Glucose, Bld 147 (*)    BUN 31 (*)    Creatinine, Ser 1.48 (*)    GFR, Estimated 44 (*)    All other components within normal limits  PROTIME-INR - Abnormal; Notable for the following components:   Prothrombin Time 29.4 (*)    INR 2.8 (*)    All other components within normal limits  POC OCCULT BLOOD, ED - Abnormal; Notable for the following components:   Fecal Occult Bld POSITIVE (*)    All other components within normal limits  CBG MONITORING, ED - Abnormal; Notable for the following components:   Glucose-Capillary 107 (*)    All other  components within normal limits  CBG MONITORING, ED - Abnormal; Notable for the following components:   Glucose-Capillary 142 (*)    All other components within normal limits  CBC  HEMOGLOBIN AND HEMATOCRIT, BLOOD  HEMOGLOBIN AND HEMATOCRIT, BLOOD  BASIC METABOLIC PANEL  CBC  PROTIME-INR  TYPE AND SCREEN    EKG EKG Interpretation Date/Time:  Monday April 13 2023 07:42:16 EST Ventricular Rate:  97 PR Interval:  145 QRS Duration:  98 QT Interval:  336 QTC  Calculation: 427 R Axis:   35  Text Interpretation: Sinus tachycardia Multiform ventricular premature complexes Anteroseptal infarct, age indeterminate Baseline wander in lead(s) V3 No significant change since last tracing Confirmed by Alvira Monday ([id number]) on 04/13/2023 7:58:59 AM  Radiology No results found.  Procedures Procedures    Medications Ordered in ED Medications  simvastatin (ZOCOR) tablet 20 mg (has no administration in time range)  tamsulosin (FLOMAX) capsule 0.4 mg (has no administration in time range)  folic acid (FOLVITE) tablet 0.5 mg (0.5 mg Oral Given 04/13/23 1253)  vitamin B-12 (CYANOCOBALAMIN) tablet 100 mcg (100 mcg Oral Given 04/13/23 1253)  insulin aspart (novoLOG) injection 0-15 Units (2 Units Subcutaneous Given 04/13/23 1834)  insulin aspart (novoLOG) injection 0-5 Units (has no administration in time range)  acetaminophen (TYLENOL) tablet 650 mg (has no administration in time range)    Or  acetaminophen (TYLENOL) suppository 650 mg (has no administration in time range)  traZODone (DESYREL) tablet 25 mg (has no administration in time range)  ondansetron (ZOFRAN) tablet 4 mg (has no administration in time range)    Or  ondansetron (ZOFRAN) injection 4 mg (has no administration in time range)  albuterol (PROVENTIL) (2.5 MG/3ML) 0.083% nebulizer solution 2.5 mg (has no administration in time range)  pantoprazole (PROTONIX) injection 40 mg (has no administration in time range)  pantoprazole (PROTONIX)  injection 80 mg (80 mg Intravenous Given 04/13/23 6045)    ED Course/ Medical Decision Making/ A&P                                  89 year old male with a history of coronary artery disease, congestive heart failure with reduced ejection fraction who sees Dr. Gala Romney, hypertension, hyperlipidemia, diabetes, history of gastric ulcers who presents with concern for rectal bleeding.  He is hemodynamically stable, initial history of symptoms like melena with normal hemoglobin for last week however recent bleeding and rectal exam today more consistent with hematochezia.  Given pantoprazole for possible UGI bleed, however consider possibility of lower source by exam.  No abdominal pain or tenderness.  Labs personally evaluated by me show no sign of anemia, no leukocytosis, INR therapeutic, mildly increased Cr.   Admitted for further care for GI bleed.  GI consulted.      Document critical care time when appropriate:1}      Final Clinical Impression(s) / ED Diagnoses Final diagnoses:  Hematochezia    Rx / DC Orders ED Discharge Orders     None         Alvira Monday, MD 04/13/23 1933

## 2023-04-13 NOTE — H&P (Signed)
 History and Physical  DEMAURI ADVINCULA WUJ:[id number] DOB: Mar 20, 1930 DOA: 04/13/2023  PCP: Shelva Majestic, MD   Chief Complaint: Bloody stools  HPI: Joseph Malone is a 89 y.o. male with medical history significant for BPH, CAD status post CABG, chronic combined systolic and diastolic heart failure, ICD, LV thrombus on Coumadin, hypertension, hyperlipidemia, noninsulin-dependent diabetes, CKD stage IIIb being admitted to the hospital with concern for upper GI bleed.  Patient is on Coumadin which is currently therapeutic, he says that he started having dark stools for about the last week.  Denies any nausea, or abdominal pain.  He does have a prior history of bleeding gastric ulcer.  Denies any abdominal pain, fevers, vomiting.  Yesterday, he noticed a little bit of bright red blood surrounding his dark melanotic stools, so came to the ER for evaluation.  Review of Systems: Please see HPI for pertinent positives and negatives. A complete 10 system review of systems are otherwise negative.  Past Medical History:  Diagnosis Date   Anxiety    BPH (benign prostatic hypertrophy)    CAD (coronary artery disease)    s/p CABG   CHF (congestive heart failure) (HCC)    Diabetes 1.5, managed as type 2 (HCC)    Diverticulosis    Diverticulosis of colon without hemorrhage 10/22/2007   Qualifier: Diagnosis of  By: Alwyn Ren MD, Chrissie Noa     DOE (dyspnea on exertion)    Excess weight    Fasting hyperglycemia    Generalized weakness    GERD (gastroesophageal reflux disease)    Heart attack (HCC) 2001   Heart failure    CHF due to ischemic CM   History of kidney stones    Hyperlipidemia    Hypertension    Ischemic cardiomyopathy    Microscopic hematuria    [facility]   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   Past Surgical History:  Procedure Laterality Date   APPENDECTOMY     BIOPSY  10/21/2018   Procedure: BIOPSY;  Surgeon: Benancio Deeds, MD;  Location: [facility];   Service: Gastroenterology;;   BIOPSY  12/15/2022   Procedure: BIOPSY;  Surgeon: Hilarie Fredrickson, MD;  Location: [facility];  Service: Gastroenterology;;   CARDIAC CATHETERIZATION  2000   CARDIAC DEFIBRILLATOR PLACEMENT  2005   [employer]tr[employer]; s/p ICD gen change and RV lead revision April 2015   CATARACT EXTRACTION  2008   bilateral with lens implant   COLONOSCOPY  2004   Tics; [facility]   COLONOSCOPY WITH PROPOFOL N/A 01/16/2022   Procedure: COLONOSCOPY WITH PROPOFOL;  Surgeon: Napoleon Form, MD;  Location: [facility];  Service: Gastroenterology;  Laterality: N/A;   CORONARY ARTERY BYPASS GRAFT  2000   1 vessel   CYSTOSCOPY  06/2012   Dr Margarita Grizzle   ESOPHAGOGASTRODUODENOSCOPY (EGD) WITH PROPOFOL N/A 10/21/2018   Procedure: ESOPHAGOGASTRODUODENOSCOPY (EGD) WITH PROPOFOL;  Surgeon: Benancio Deeds, MD;  Location: [facility];  Service: Gastroenterology;  Laterality: N/A;   ESOPHAGOGASTRODUODENOSCOPY (EGD) WITH PROPOFOL N/A 12/15/2022   Procedure: ESOPHAGOGASTRODUODENOSCOPY (EGD) WITH PROPOFOL;  Surgeon: Hilarie Fredrickson, MD;  Location: [facility];  Service: Gastroenterology;  Laterality: N/A;   HOT HEMOSTASIS N/A 10/21/2018   Procedure: HOT HEMOSTASIS (ARGON PLASMA COAGULATION/BICAP);  Surgeon: Benancio Deeds, MD;  Location: [facility];  Service: Gastroenterology;  Laterality: N/A;   IMPLANTABLE CARDIOVERTER DEFIBRILLATOR (ICD) GENERATOR CHANGE N/A 06/01/2013   Procedure: ICD GENERATOR CHANGE;  Surgeon: Marinus Maw, MD;  Location: [facility];  Service: Cardiovascular;  Laterality: N/A;   LEAD REVISION N/A 06/01/2013   Procedure: LEAD REVISION;  Surgeon: Marinus Maw, MD;  Location: [facility] CATH LAB;  Service: Cardiovascular;  Laterality: N/A;   PILONIDAL CYST EXCISION     POLYPECTOMY  01/16/2022   Procedure: POLYPECTOMY;  Surgeon: Napoleon Form, MD;  Location: [facility];  Service: Gastroenterology;;   RIGHT/LEFT HEART CATH AND CORONARY/GRAFT ANGIOGRAPHY N/A 06/12/2017    Procedure: RIGHT/LEFT HEART CATH AND CORONARY/GRAFT ANGIOGRAPHY;  Surgeon: Dolores Patty, MD;  Location: [facility] CV [facility];  Service: Cardiovascular;  Laterality: N/A;   TRANSTHORACIC ECHOCARDIOGRAM  08/29/2005   Social History:  reports that he has never smoked. He has been exposed to tobacco smoke. He has never used smokeless tobacco. He reports that he does not drink alcohol and does not use drugs.  Allergies  Allergen Reactions   Penicillins Rash and Other (See Comments)    Family History  Problem Relation Age of Onset   Hypertension Father    Heart attack Father 70   Diabetes Brother        X 2   Nephrolithiasis Brother    Heart attack Brother 73   Heart attack Paternal Uncle 24   Sudden death Paternal Grandfather 64       ? heat stroke   Lung cancer Sister        "Asian lung cancer"   Stroke Neg Hx    Colon cancer Neg Hx    Esophageal cancer Neg Hx    Rectal cancer Neg Hx    Stomach cancer Neg Hx      Prior to Admission medications   Medication Sig Start Date End Date Taking? Authorizing Provider  acetaminophen (TYLENOL) 500 MG tablet Take 500-1,000 mg by mouth at bedtime.   Yes [provider]  aspirin 81 MG tablet Take 1 tablet (81 mg total) by mouth every evening. 12/17/22  Yes Pahwani, Daleen Bo, MD  folic acid (FOLVITE) 400 MCG tablet Take 400 mcg by mouth daily.   Yes [provider]  Glucosamine HCl 1500 MG TABS Take 1,500 mg by mouth every evening.    Yes [provider]  Melatonin 5 MG CHEW Chew 1 tablet by mouth at bedtime.   Yes [provider]  metFORMIN (GLUCOPHAGE-XR) 500 MG 24 hr tablet TAKE 1 TABLET(500 MG) BY MOUTH DAILY WITH BREAKFAST 05/30/22  Yes Shelva Majestic, MD  Multiple Vitamin (MULTIVITAMIN WITH MINERALS) TABS Take 1 tablet by mouth at bedtime.   Yes [provider]  nitroGLYCERIN (NITROSTAT) 0.4 MG SL tablet Place 1 tablet (0.4 mg total) under the tongue every 5 (five) minutes as needed for chest  pain. 1 tablet every 5 minutes for 3 dose only 02/11/22 07/11/23 Yes Milford, Anderson Malta, FNP  omeprazole (PRILOSEC) 40 MG capsule Take 1 capsule (40 mg total) by mouth daily. 12/24/22 12/19/23 Yes Shelva Majestic, MD  simvastatin (ZOCOR) 40 MG tablet Take 0.5 tablets (20 mg total) by mouth at bedtime. 04/09/23  Yes Bensimhon, Bevelyn Buckles, MD  spironolactone (ALDACTONE) 25 MG tablet TAKE 1/2 TABLET BY MOUTH DAILY Patient taking differently: Take 12.5 mg by mouth daily as needed (Weight gain/Swelling). 12/11/22  Yes Shelva Majestic, MD  tamsulosin (FLOMAX) 0.4 MG CAPS capsule Take 0.4 mg by mouth at bedtime. 12/13/19  Yes [provider]  vitamin B-12 (CYANOCOBALAMIN) 100 MCG tablet Take 100 mcg by mouth daily.   Yes [provider]  warfarin (COUMADIN) 5 MG tablet TAKE 1  TABLET BY MOUTH DAILY EXCEPT TAKE 1/2 TABLET ON MONDAY OR AS DIRECTED BY ANTICOAGULATION CLINIC 01/22/23  Yes Shelva Majestic, MD    Physical Exam: BP 111/85   Pulse 97   Temp 97.7 F (36.5 C) (Oral)   Resp 18   Ht 5\' 11"  (1.803 m)   Wt 97.5 kg   SpO2 99%   BMI 29.99 kg/m  General:  Alert, oriented, calm, in no acute distress, looks very comfortable, and younger than his stated age Cardiovascular: RRR, no murmurs or rubs, no peripheral edema  Respiratory: clear to auscultation bilaterally, no wheezes, no crackles  Abdomen: soft, nontender, nondistended, normal bowel tones heard  Skin: dry, no rashes  Musculoskeletal: no joint effusions, normal range of motion  Psychiatric: appropriate affect, normal speech  Neurologic: extraocular muscles intact, clear speech, moving all extremities with intact sensorium         Labs on Admission:  Basic Metabolic Panel: Recent Labs  Lab 04/13/23 0802  NA 139  K 3.7  CL 108  CO2 22  GLUCOSE 147*  BUN 31*  CREATININE 1.48*  CALCIUM 9.2   Liver Function Tests: Recent Labs  Lab 04/13/23 0802  AST 23  ALT 18  ALKPHOS 51  BILITOT 0.7  PROT 6.7  ALBUMIN  4.0   No results for input(s): "LIPASE", "AMYLASE" in the last 168 hours. No results for input(s): "AMMONIA" in the last 168 hours. CBC: Recent Labs  Lab 04/13/23 0802  WBC 9.0  HGB 14.3  HCT 43.8  MCV 94.8  PLT 154   Cardiac Enzymes: No results for input(s): "CKTOTAL", "CKMB", "CKMBINDEX", "TROPONINI" in the last 168 hours. BNP (last 3 results) Recent Labs    07/11/22 0941 07/24/22 1501  BNP 201.7* 79.8    ProBNP (last 3 results) No results for input(s): "PROBNP" in the last 8760 hours.  CBG: No results for input(s): "GLUCAP" in the last 168 hours.  Radiological Exams on Admission: No results found. Assessment/Plan DONAT HUMBLE is a 89 y.o. male with medical history significant for BPH, CAD status post CABG, chronic combined systolic and diastolic heart failure, ICD, LV thrombus on Coumadin, hypertension, hyperlipidemia, noninsulin-dependent diabetes, CKD stage IIIb being admitted to the hospital with concern for upper GI bleed.    Upper GI bleed-suspected due to prior history of bleeding ulcers in the setting of anticoagulation, melanotic stools, BUN is elevated when compared to November 2024. -Observation admission to progressive -Telemetry monitoring -Clear liquid diet -[facility] has been consulted by ER provider -Trend hemoglobin every 8 hours -Given his hemodynamic stability, normal hemoglobin for now we will hold aspirin and Coumadin, but not reverse his INR.  In case of brisk bleeding or any instability, INR can rapidly be reversed.  CKD stage IIIb-renal function appears to be essentially at baseline  Type 2 diabetes-carb modified diet when eating, hold home metformin, moderate dose sliding scale in the meantime  Combined systolic and diastolic congestive heart failure-patient appears euvolemic and takes Aldactone as needed  GERD-will switch to IV Protonix daily  Hyperlipidemia-Zocor  BPH-Flomax  DVT prophylaxis: Currently INR is therapeutic, monitor  INR daily.  SCDs in the meantime.    Code Status: Full Code  Consults called: [facility]  Admission status: Observation  Time spent: 55 minutes  Rihaan Barrack Sharlette Dense MD [facility] Pager [phone]  If 7PM-7AM, please contact night-coverage [email or web address] Password [facility]  04/13/2023, 10:57 AM

## 2023-04-14 DIAGNOSIS — Z833 Family history of diabetes mellitus: Secondary | ICD-10-CM | POA: Diagnosis not present

## 2023-04-14 DIAGNOSIS — I5042 Chronic combined systolic (congestive) and diastolic (congestive) heart failure: Secondary | ICD-10-CM | POA: Diagnosis present

## 2023-04-14 DIAGNOSIS — I130 Hypertensive heart and chronic kidney disease with heart failure and stage 1 through stage 4 chronic kidney disease, or unspecified chronic kidney disease: Secondary | ICD-10-CM | POA: Diagnosis present

## 2023-04-14 DIAGNOSIS — K222 Esophageal obstruction: Secondary | ICD-10-CM | POA: Diagnosis present

## 2023-04-14 DIAGNOSIS — K625 Hemorrhage of anus and rectum: Secondary | ICD-10-CM | POA: Diagnosis not present

## 2023-04-14 DIAGNOSIS — Z79899 Other long term (current) drug therapy: Secondary | ICD-10-CM | POA: Diagnosis not present

## 2023-04-14 DIAGNOSIS — K254 Chronic or unspecified gastric ulcer with hemorrhage: Secondary | ICD-10-CM | POA: Diagnosis present

## 2023-04-14 DIAGNOSIS — K921 Melena: Secondary | ICD-10-CM | POA: Diagnosis present

## 2023-04-14 DIAGNOSIS — K648 Other hemorrhoids: Secondary | ICD-10-CM | POA: Diagnosis not present

## 2023-04-14 DIAGNOSIS — E1122 Type 2 diabetes mellitus with diabetic chronic kidney disease: Secondary | ICD-10-CM | POA: Diagnosis present

## 2023-04-14 DIAGNOSIS — K922 Gastrointestinal hemorrhage, unspecified: Secondary | ICD-10-CM | POA: Diagnosis not present

## 2023-04-14 DIAGNOSIS — E785 Hyperlipidemia, unspecified: Secondary | ICD-10-CM | POA: Diagnosis present

## 2023-04-14 DIAGNOSIS — D120 Benign neoplasm of cecum: Secondary | ICD-10-CM | POA: Diagnosis not present

## 2023-04-14 DIAGNOSIS — K259 Gastric ulcer, unspecified as acute or chronic, without hemorrhage or perforation: Secondary | ICD-10-CM | POA: Diagnosis not present

## 2023-04-14 DIAGNOSIS — D125 Benign neoplasm of sigmoid colon: Secondary | ICD-10-CM | POA: Diagnosis present

## 2023-04-14 DIAGNOSIS — I2510 Atherosclerotic heart disease of native coronary artery without angina pectoris: Secondary | ICD-10-CM | POA: Diagnosis present

## 2023-04-14 DIAGNOSIS — K5140 Inflammatory polyps of colon without complications: Secondary | ICD-10-CM | POA: Diagnosis not present

## 2023-04-14 DIAGNOSIS — K5730 Diverticulosis of large intestine without perforation or abscess without bleeding: Secondary | ICD-10-CM | POA: Diagnosis not present

## 2023-04-14 DIAGNOSIS — K2961 Other gastritis with bleeding: Secondary | ICD-10-CM | POA: Diagnosis not present

## 2023-04-14 DIAGNOSIS — K219 Gastro-esophageal reflux disease without esophagitis: Secondary | ICD-10-CM | POA: Diagnosis present

## 2023-04-14 DIAGNOSIS — Z7982 Long term (current) use of aspirin: Secondary | ICD-10-CM | POA: Diagnosis not present

## 2023-04-14 DIAGNOSIS — I252 Old myocardial infarction: Secondary | ICD-10-CM | POA: Diagnosis not present

## 2023-04-14 DIAGNOSIS — K5731 Diverticulosis of large intestine without perforation or abscess with bleeding: Secondary | ICD-10-CM | POA: Diagnosis present

## 2023-04-14 DIAGNOSIS — D6832 Hemorrhagic disorder due to extrinsic circulating anticoagulants: Secondary | ICD-10-CM | POA: Diagnosis present

## 2023-04-14 DIAGNOSIS — Z7901 Long term (current) use of anticoagulants: Secondary | ICD-10-CM | POA: Diagnosis not present

## 2023-04-14 DIAGNOSIS — K317 Polyp of stomach and duodenum: Secondary | ICD-10-CM | POA: Diagnosis present

## 2023-04-14 DIAGNOSIS — Z95810 Presence of automatic (implantable) cardiac defibrillator: Secondary | ICD-10-CM | POA: Diagnosis not present

## 2023-04-14 DIAGNOSIS — Z8249 Family history of ischemic heart disease and other diseases of the circulatory system: Secondary | ICD-10-CM | POA: Diagnosis not present

## 2023-04-14 DIAGNOSIS — N400 Enlarged prostate without lower urinary tract symptoms: Secondary | ICD-10-CM | POA: Diagnosis present

## 2023-04-14 DIAGNOSIS — K920 Hematemesis: Secondary | ICD-10-CM | POA: Diagnosis not present

## 2023-04-14 DIAGNOSIS — Z801 Family history of malignant neoplasm of trachea, bronchus and lung: Secondary | ICD-10-CM | POA: Diagnosis not present

## 2023-04-14 DIAGNOSIS — Z7984 Long term (current) use of oral hypoglycemic drugs: Secondary | ICD-10-CM | POA: Diagnosis not present

## 2023-04-14 DIAGNOSIS — Z951 Presence of aortocoronary bypass graft: Secondary | ICD-10-CM | POA: Diagnosis not present

## 2023-04-14 DIAGNOSIS — I255 Ischemic cardiomyopathy: Secondary | ICD-10-CM | POA: Diagnosis present

## 2023-04-14 DIAGNOSIS — K5733 Diverticulitis of large intestine without perforation or abscess with bleeding: Secondary | ICD-10-CM | POA: Diagnosis not present

## 2023-04-14 LAB — GLUCOSE, CAPILLARY
Glucose-Capillary: 106 mg/dL — ABNORMAL HIGH (ref 70–99)
Glucose-Capillary: 134 mg/dL — ABNORMAL HIGH (ref 70–99)
Glucose-Capillary: 160 mg/dL — ABNORMAL HIGH (ref 70–99)
Glucose-Capillary: 104 mg/dL — ABNORMAL HIGH (ref 70–99)

## 2023-04-14 LAB — HEMOGLOBIN AND HEMATOCRIT, BLOOD
HCT: 38.6 % — ABNORMAL LOW (ref 39.0–52.0)
HCT: 41 % (ref 39.0–52.0)
Hemoglobin: 12.5 g/dL — ABNORMAL LOW (ref 13.0–17.0)
Hemoglobin: 13.1 g/dL (ref 13.0–17.0)

## 2023-04-14 LAB — BASIC METABOLIC PANEL
Anion gap: 10 (ref 5–15)
BUN: 27 mg/dL — ABNORMAL HIGH (ref 8–23)
CO2: 23 mmol/L (ref 22–32)
Calcium: 8.6 mg/dL — ABNORMAL LOW (ref 8.9–10.3)
Chloride: 105 mmol/L (ref 98–111)
Creatinine, Ser: 1.29 mg/dL — ABNORMAL HIGH (ref 0.61–1.24)
GFR, Estimated: 52 mL/min — ABNORMAL LOW (ref >60)
Glucose, Bld: 109 mg/dL — ABNORMAL HIGH (ref 70–99)
Potassium: 3.8 mmol/L (ref 3.5–5.1)
Sodium: 138 mmol/L (ref 135–145)

## 2023-04-14 LAB — PROTIME-INR
INR: 2.8 — ABNORMAL HIGH (ref 0.8–1.2)
Prothrombin Time: 30 s — ABNORMAL HIGH (ref 11.4–15.2)

## 2023-04-14 LAB — CBC
HCT: 39 % (ref 39.0–52.0)
Hemoglobin: 12.7 g/dL — ABNORMAL LOW (ref 13.0–17.0)
MCH: 31 pg (ref 26.0–34.0)
MCHC: 32.6 g/dL (ref 30.0–36.0)
MCV: 95.1 fL (ref 80.0–100.0)
Platelets: 137 10*3/uL — ABNORMAL LOW (ref 150–400)
RBC: 4.1 MIL/uL — ABNORMAL LOW (ref 4.22–5.81)
RDW: 12.2 % (ref 11.5–15.5)
WBC: 7.4 10*3/uL (ref 4.0–10.5)
nRBC: 0 % (ref 0.0–0.2)

## 2023-04-14 SURGERY — ENTEROSCOPY
Anesthesia: Monitor Anesthesia Care

## 2023-04-14 NOTE — Progress Notes (Addendum)
     [facility] Progress Note  CC:   GI bleed   Subjective: Had a bowel movement last night that was not quite as bloody looking.  No bowel movement so far today.  Objective:  Vital signs in last 24 hours: Temp:  [97 F (36.1 C)-98.9 F (37.2 C)] 97 F (36.1 C) (03/04 0533) Pulse Rate:  [41-87] 41 (03/04 0533) Resp:  [13-20] 14 (03/04 0533) BP: (119-155)/(58-79) 119/74 (03/04 0533) SpO2:  [93 %-96 %] 93 % (03/04 0533) Weight:  [96.3 kg] 96.3 kg (03/03 1755) Last BM Date : 04/13/23 General:  Alert, Well-developed, in NAD Heart:  Regular rate and rhythm; no murmurs Pulm: Clear to auscultation bilaterally.  No wheezes rales or rhonchi. Abdomen:  Soft, non-distended.  BS present.  Non-tender. Extremities:  Without edema. Neurologic:  Alert and oriented x 4;  grossly normal neurologically. Psych:  Alert and cooperative. Normal mood and affect.  Intake/Output from previous day: 03/03 0701 - 03/04 0700 In: 480 [P.O.:480] Out: 250 [Urine:250]  Lab Results: Recent Labs    04/13/23 0802 04/13/23 1705 04/14/23 0051  WBC 9.0  --  7.4  HGB 14.3 13.2 12.7*  HCT 43.8 39.9 39.0  PLT 154  --  137*   BMET Recent Labs    04/13/23 0802 04/14/23 0051  NA 139 138  K 3.7 3.8  CL 108 105  CO2 22 23  GLUCOSE 147* 109*  BUN 31* 27*  CREATININE 1.48* 1.29*  CALCIUM 9.2 8.6*   LFT Recent Labs    04/13/23 0802  PROT 6.7  ALBUMIN 4.0  AST 23  ALT 18  ALKPHOS 51  BILITOT 0.7   PT/INR Recent Labs    04/13/23 0802 04/14/23 0051  LABPROT 29.4* 30.0*  INR 2.8* 2.8*   Assessment / Plan: *GI bleed: Started out with black stools for the past week and then started seeing maroon blood on the toilet paper.  Has maroon stools on exam today.  Concern for ulcers versus AVM versus possibly diverticular bleed, with the black stools I think less likely.  Hgb normal, down just slightly this AM at 12.7 grams. *LV thrombus on Coumadin: INR still 2.8 this AM  -Will plan  for EGD/enteroscopy and flexible sigmoidoscopy on 3/5.  Procedures being pushed so that hopefully INR will drift down to 2.5 or less. -Monitor Hgb and transfuse prn. -Continue pantoprazole 40 mg twice daily. -Orders are already in for INR to be rechecked in the morning.   LOS: 0 days   Princella Pellegrini. Zehr  04/14/2023, 8:35 AM   Attending physician's note   I have taken history, reviewed the chart and examined the patient. I performed a substantive portion of this encounter, including complete performance of at least one of the key components, in conjunction with the APP. I agree with the Advanced Practitioner's note, impression and recommendations.   Had 2 bloody Bms today. Hb stable, HD stable On clear liq diet Endo proc on hold since INR was 2.8 this AM  For Enteroscopy/FS 3/5 if INR <2.5 Trend CBC Continue protonix.   Edman Circle, MD [facility] [phone]

## 2023-04-14 NOTE — Plan of Care (Signed)
  Problem: Education: Goal: Ability to describe self-care measures that may prevent or decrease complications (Diabetes Survival Skills Education) will improve Outcome: Progressing   Problem: Coping: Goal: Ability to adjust to condition or change in health will improve Outcome: Progressing   Problem: Health Behavior/Discharge Planning: Goal: Ability to identify and utilize available resources and services will improve Outcome: Progressing

## 2023-04-14 NOTE — Progress Notes (Signed)
   04/14/23 0950  TOC Brief Assessment  Insurance and Status Reviewed  Patient has primary care physician Yes  Home environment has been reviewed Resides alone in an apartment  Prior level of function: Independent with ADLs at baseline  Prior/Current Home Services No current home services  Social Drivers of Health Review SDOH reviewed no interventions necessary  Readmission risk has been reviewed Yes  Transition of care needs no transition of care needs at this time

## 2023-04-14 NOTE — H&P (View-Only) (Signed)
     [facility] Progress Note  CC:   GI bleed   Subjective: Had a bowel movement last night that was not quite as bloody looking.  No bowel movement so far today.  Objective:  Vital signs in last 24 hours: Temp:  [97 F (36.1 C)-98.9 F (37.2 C)] 97 F (36.1 C) (03/04 0533) Pulse Rate:  [41-87] 41 (03/04 0533) Resp:  [13-20] 14 (03/04 0533) BP: (119-155)/(58-79) 119/74 (03/04 0533) SpO2:  [93 %-96 %] 93 % (03/04 0533) Weight:  [96.3 kg] 96.3 kg (03/03 1755) Last BM Date : 04/13/23 General:  Alert, Well-developed, in NAD Heart:  Regular rate and rhythm; no murmurs Pulm: Clear to auscultation bilaterally.  No wheezes rales or rhonchi. Abdomen:  Soft, non-distended.  BS present.  Non-tender. Extremities:  Without edema. Neurologic:  Alert and oriented x 4;  grossly normal neurologically. Psych:  Alert and cooperative. Normal mood and affect.  Intake/Output from previous day: 03/03 0701 - 03/04 0700 In: 480 [P.O.:480] Out: 250 [Urine:250]  Lab Results: Recent Labs    04/13/23 0802 04/13/23 1705 04/14/23 0051  WBC 9.0  --  7.4  HGB 14.3 13.2 12.7*  HCT 43.8 39.9 39.0  PLT 154  --  137*   BMET Recent Labs    04/13/23 0802 04/14/23 0051  NA 139 138  K 3.7 3.8  CL 108 105  CO2 22 23  GLUCOSE 147* 109*  BUN 31* 27*  CREATININE 1.48* 1.29*  CALCIUM 9.2 8.6*   LFT Recent Labs    04/13/23 0802  PROT 6.7  ALBUMIN 4.0  AST 23  ALT 18  ALKPHOS 51  BILITOT 0.7   PT/INR Recent Labs    04/13/23 0802 04/14/23 0051  LABPROT 29.4* 30.0*  INR 2.8* 2.8*   Assessment / Plan: *GI bleed: Started out with black stools for the past week and then started seeing maroon blood on the toilet paper.  Has maroon stools on exam today.  Concern for ulcers versus AVM versus possibly diverticular bleed, with the black stools I think less likely.  Hgb normal, down just slightly this AM at 12.7 grams. *LV thrombus on Coumadin: INR still 2.8 this AM  -Will plan  for EGD/enteroscopy and flexible sigmoidoscopy on 3/5.  Procedures being pushed so that hopefully INR will drift down to 2.5 or less. -Monitor Hgb and transfuse prn. -Continue pantoprazole 40 mg twice daily. -Orders are already in for INR to be rechecked in the morning.   LOS: 0 days   Joseph Malone. Zehr  04/14/2023, 8:35 AM   Attending physician's note   I have taken history, reviewed the chart and examined the patient. I performed a substantive portion of this encounter, including complete performance of at least one of the key components, in conjunction with the APP. I agree with the Advanced Practitioner's note, impression and recommendations.   Had 2 bloody Bms today. Hb stable, HD stable On clear liq diet Endo proc on hold since INR was 2.8 this AM  For Enteroscopy/FS 3/5 if INR <2.5 Trend CBC Continue protonix.   Edman Circle, MD [facility] [phone]

## 2023-04-14 NOTE — Hospital Course (Addendum)
 Brief Narrative:   89 year old with history of BPH, CAD status post CABG, CHF with reduced EF with ICD in place, LV thrombus on Coumadin, HTN, HLD, DM2, CKD 3B comes to the hospital with complaints of bright red blood per rectum with some dark stools.  INR therapeutic on Coumadin.  Patient has been seen by GI.  Tentatively planning endoscopic evaluation later today.  Assessment & Plan:  Active Problems:   Hematochezia   Bright red blood per rectum - Unclear source at this time.  GI planning endoscopic evaluation later today.  Continue to hold anticoagulation for now.  Hemoglobin currently overall stable around 13.0.  INR stable at 2.8  LV thrombus - On Coumadin with therapeutic INR.  Currently on hold.  CKD stage II -Creatinine stable at 1.2  Diabetes mellitus type 2 -Sliding scale and Accu-Chek  Congestive heart failure with reduced EF, EF 30% Status post [employer]tr[employer] ICD CAD status post CABG in 2001 -On aspirin Aldactone which is currently on hold   GERD -PPI  Hyperlipidemia -Zocor  BPH -Flomax   DVT prophylaxis: SCDs Start: 04/13/23 1057    Code Status: Full Code Family Communication: Offered to call family but he states he will update them himself Ongoing evaluation for GI bleeding    Subjective: No complaints.  Tells me overall he feels better   Examination:  General exam: Appears calm and comfortable  Respiratory system: Clear to auscultation. Respiratory effort normal. Cardiovascular system: S1 & S2 heard, RRR. No JVD, murmurs, rubs, gallops or clicks. No pedal edema. Gastrointestinal system: Abdomen is nondistended, soft and nontender. No organomegaly or masses felt. Normal bowel sounds heard. Central nervous system: Alert and oriented. No focal neurological deficits. Extremities: Symmetric 5 x 5 power. Skin: No rashes, lesions or ulcers Psychiatry: Judgement and insight appear normal. Mood & affect appropriate.

## 2023-04-14 NOTE — Progress Notes (Signed)
 PROGRESS NOTE    Joseph Malone  QMV:[id number] DOB: 04/23/1930 DOA: 04/13/2023 PCP: Shelva Majestic, MD    Brief Narrative:   89 year old with history of BPH, CAD status post CABG, CHF with reduced EF with ICD in place, LV thrombus on Coumadin, HTN, HLD, DM2, CKD 3B comes to the hospital with complaints of bright red blood per rectum with some dark stools.  INR therapeutic on Coumadin.  Patient has been seen by GI.  Tentatively planning endoscopic evaluation later today.  Assessment & Plan:  Active Problems:   Hematochezia   Bright red blood per rectum - Unclear source at this time.  GI planning endoscopic evaluation later today.  Continue to hold anticoagulation for now.  Hemoglobin currently overall stable around 13.0.  INR stable at 2.8  LV thrombus - On Coumadin with therapeutic INR.  Currently on hold.  CKD stage II -Creatinine stable at 1.2  Diabetes mellitus type 2 -Sliding scale and Accu-Chek  Congestive heart failure with reduced EF, EF 30% Status post Medtronic ICD CAD status post CABG in 2001 -On aspirin Aldactone which is currently on hold   GERD -PPI  Hyperlipidemia -Zocor  BPH -Flomax   DVT prophylaxis: SCDs Start: 04/13/23 1057    Code Status: Full Code Family Communication: Offered to call family but he states he will update them himself Ongoing evaluation for GI bleeding    Subjective: No complaints.  Tells me overall he feels better   Examination:  General exam: Appears calm and comfortable  Respiratory system: Clear to auscultation. Respiratory effort normal. Cardiovascular system: S1 & S2 heard, RRR. No JVD, murmurs, rubs, gallops or clicks. No pedal edema. Gastrointestinal system: Abdomen is nondistended, soft and nontender. No organomegaly or masses felt. Normal bowel sounds heard. Central nervous system: Alert and oriented. No focal neurological deficits. Extremities: Symmetric 5 x 5 power. Skin: No rashes, lesions or  ulcers Psychiatry: Judgement and insight appear normal. Mood & affect appropriate.                Diet Orders (From admission, onward)     Start     Ordered   04/15/23 0001  Diet NPO time specified  Diet effective midnight        04/14/23 0830   04/14/23 0830  Diet clear liquid Fluid consistency: Thin  Diet effective now       Question:  Fluid consistency:  Answer:  Thin   04/14/23 0829            Objective: Vitals:   04/14/23 0134 04/14/23 0137 04/14/23 0200 04/14/23 0533  BP: (!) 155/79   119/74  Pulse:  87  (!) 41  Resp: 15  13 14   Temp: 98.9 F (37.2 C)   (!) 97 F (36.1 C)  TempSrc:      SpO2: 96% 96%  93%  Weight:      Height:        Intake/Output Summary (Last 24 hours) at 04/14/2023 1025 Last data filed at 04/13/2023 2300 Gross per 24 hour  Intake 480 ml  Output 250 ml  Net 230 ml   Filed Weights   04/13/23 0747 04/13/23 1755  Weight: 97.5 kg 96.3 kg    Scheduled Meds:  folic acid  500 mcg Oral Daily   insulin aspart  0-15 Units Subcutaneous TID WC   insulin aspart  0-5 Units Subcutaneous QHS   pantoprazole (PROTONIX) IV  40 mg Intravenous Q12H   simvastatin  20 mg Oral QHS  tamsulosin  0.4 mg Oral QHS   vitamin B-12  100 mcg Oral Daily   Continuous Infusions:  Nutritional status     Body mass index is 29.61 kg/m.  Data Reviewed:   CBC: Recent Labs  Lab 04/13/23 0802 04/13/23 1705 04/14/23 0051  WBC 9.0  --  7.4  HGB 14.3 13.2 12.7*  HCT 43.8 39.9 39.0  MCV 94.8  --  95.1  PLT 154  --  137*   Basic Metabolic Panel: Recent Labs  Lab 04/13/23 0802 04/14/23 0051  NA 139 138  K 3.7 3.8  CL 108 105  CO2 22 23  GLUCOSE 147* 109*  BUN 31* 27*  CREATININE 1.48* 1.29*  CALCIUM 9.2 8.6*   GFR: Estimated Creatinine Clearance: 44.2 mL/min (A) (by C-G formula based on SCr of 1.29 mg/dL (H)). Liver Function Tests: Recent Labs  Lab 04/13/23 0802  AST 23  ALT 18  ALKPHOS 51  BILITOT 0.7  PROT 6.7  ALBUMIN 4.0   No  results for input(s): "LIPASE", "AMYLASE" in the last 168 hours. No results for input(s): "AMMONIA" in the last 168 hours. Coagulation Profile: Recent Labs  Lab 04/13/23 0802 04/14/23 0051  INR 2.8* 2.8*   Cardiac Enzymes: No results for input(s): "CKTOTAL", "CKMB", "CKMBINDEX", "TROPONINI" in the last 168 hours. BNP (last 3 results) No results for input(s): "PROBNP" in the last 8760 hours. HbA1C: No results for input(s): "HGBA1C" in the last 72 hours. CBG: Recent Labs  Lab 04/13/23 1200 04/13/23 1640 04/13/23 2012 04/14/23 0802  GLUCAP 107* 142* 169* 106*   Lipid Profile: No results for input(s): "CHOL", "HDL", "LDLCALC", "TRIG", "CHOLHDL", "LDLDIRECT" in the last 72 hours. Thyroid Function Tests: No results for input(s): "TSH", "T4TOTAL", "FREET4", "T3FREE", "THYROIDAB" in the last 72 hours. Anemia Panel: No results for input(s): "VITAMINB12", "FOLATE", "FERRITIN", "TIBC", "IRON", "RETICCTPCT" in the last 72 hours. Sepsis Labs: No results for input(s): "PROCALCITON", "LATICACIDVEN" in the last 168 hours.  No results found for this or any previous visit (from the past 240 hours).       Radiology Studies: No results found.         LOS: 0 days   Time spent= 35 mins    Miguel Rota, MD [facility]  If 7PM-7AM, please contact night-coverage  04/14/2023, 10:25 AM

## 2023-04-14 NOTE — Care Management Obs Status (Signed)
 MEDICARE OBSERVATION STATUS NOTIFICATION   Patient Details  Name: Joseph Malone MRN: [id number] Date of Birth: [birth year]   Medicare Observation Status Notification Given:  Yes    Ewing Schlein, LCSW 04/14/2023, 9:42 AM

## 2023-04-14 NOTE — Plan of Care (Signed)

## 2023-04-15 ENCOUNTER — Ambulatory Visit: Payer: Medicare Other

## 2023-04-15 DIAGNOSIS — K921 Melena: Secondary | ICD-10-CM | POA: Diagnosis not present

## 2023-04-15 LAB — BASIC METABOLIC PANEL
Anion gap: 10 (ref 5–15)
BUN: 26 mg/dL — ABNORMAL HIGH (ref 8–23)
CO2: 22 mmol/L (ref 22–32)
Calcium: 8.9 mg/dL (ref 8.9–10.3)
Chloride: 105 mmol/L (ref 98–111)
Creatinine, Ser: 1.51 mg/dL — ABNORMAL HIGH (ref 0.61–1.24)
GFR, Estimated: 43 mL/min — ABNORMAL LOW (ref >60)
Glucose, Bld: 116 mg/dL — ABNORMAL HIGH (ref 70–99)
Potassium: 4 mmol/L (ref 3.5–5.1)
Sodium: 137 mmol/L (ref 135–145)

## 2023-04-15 LAB — CBC
HCT: 39 % (ref 39.0–52.0)
Hemoglobin: 12.5 g/dL — ABNORMAL LOW (ref 13.0–17.0)
MCH: 30.8 pg (ref 26.0–34.0)
MCHC: 32.1 g/dL (ref 30.0–36.0)
MCV: 96.1 fL (ref 80.0–100.0)
Platelets: 156 10*3/uL (ref 150–400)
RBC: 4.06 MIL/uL — ABNORMAL LOW (ref 4.22–5.81)
RDW: 12.2 % (ref 11.5–15.5)
WBC: 8.1 10*3/uL (ref 4.0–10.5)
nRBC: 0 % (ref 0.0–0.2)

## 2023-04-15 LAB — PROTIME-INR
INR: 2.6 — ABNORMAL HIGH (ref 0.8–1.2)
Prothrombin Time: 28.1 s — ABNORMAL HIGH (ref 11.4–15.2)

## 2023-04-15 LAB — GLUCOSE, CAPILLARY
Glucose-Capillary: 107 mg/dL — ABNORMAL HIGH (ref 70–99)
Glucose-Capillary: 191 mg/dL — ABNORMAL HIGH (ref 70–99)
Glucose-Capillary: 98 mg/dL (ref 70–99)
Glucose-Capillary: 106 mg/dL — ABNORMAL HIGH (ref 70–99)

## 2023-04-15 LAB — MAGNESIUM: Magnesium: 2 mg/dL (ref 1.7–2.4)

## 2023-04-15 MED ORDER — IPRATROPIUM-ALBUTEROL 0.5-2.5 (3) MG/3ML IN SOLN: 3.0000 mL | RESPIRATORY_TRACT | Status: DC | PRN

## 2023-04-15 MED ORDER — METOPROLOL TARTRATE 5 MG/5ML IV SOLN: 5.0000 mg | INTRAVENOUS | Status: DC | PRN

## 2023-04-15 MED ORDER — HYDRALAZINE HCL 20 MG/ML IJ SOLN: 10.0000 mg | INTRAMUSCULAR | Status: DC | PRN

## 2023-04-15 MED ORDER — VITAMIN K1 10 MG/ML IJ SOLN
1.0000 mg | Freq: Once | INTRAMUSCULAR | Status: AC
  Administered 2023-04-15: 1 mg via INTRAVENOUS
  Filled 2023-04-15: qty 0.1

## 2023-04-15 NOTE — Progress Notes (Signed)
 Patient not seen today.  Was supposed to have EGD for evaluation of GI bleed, but unfortunately INR is still too high at 2.6 today.  EGD has been postponed until 3/6.  Hemoglobin is stable.  He was placed on full liquid diet for today and then n.p.o. after midnight.

## 2023-04-15 NOTE — Progress Notes (Signed)
 PROGRESS NOTE    Joseph Malone  WUJ:[id number] DOB: Jan 20, 1931 DOA: 04/13/2023 PCP: Shelva Majestic, MD    Brief Narrative:   89 year old with history of BPH, CAD status post CABG, CHF with reduced EF with ICD in place, LV thrombus on Coumadin, HTN, HLD, DM2, CKD 3B comes to the hospital with complaints of bright red blood per rectum with some dark stools.  INR therapeutic on Coumadin.  Patient has been seen by GI.  EGD timing per GI.   Assessment & Plan:  Active Problems:   Hematochezia   Bright red blood per rectum - Unclear source at this time.  GI planning endoscopic evaluation once INR is satisfactory.   Continue to hold anticoagulation for now.  Hemoglobin currently overall stable around 13.0.  INR stable at 2.6  LV thrombus - On Coumadin with therapeutic INR.  Currently on hold.  CKD stage II -Creatinine stable at 1.2  Diabetes mellitus type 2 -Sliding scale and Accu-Chek  Congestive heart failure with reduced EF, EF 30% Status post Medtronic ICD CAD status post CABG in 2001 -On aspirin Aldactone which is currently on hold   GERD -PPI  Hyperlipidemia -Zocor  BPH -Flomax   DVT prophylaxis: SCDs Start: 04/13/23 1057    Code Status: Full Code Family Communication: I will call his daughter Ongoing evaluation for GI bleeding    Subjective: No complaints doing okay   Examination:  General exam: Appears calm and comfortable  Respiratory system: Clear to auscultation. Respiratory effort normal. Cardiovascular system: S1 & S2 heard, RRR. No JVD, murmurs, rubs, gallops or clicks. No pedal edema. Gastrointestinal system: Abdomen is nondistended, soft and nontender. No organomegaly or masses felt. Normal bowel sounds heard. Central nervous system: Alert and oriented. No focal neurological deficits. Extremities: Symmetric 5 x 5 power. Skin: No rashes, lesions or ulcers Psychiatry: Judgement and insight appear normal. Mood & affect appropriate.                 Diet Orders (From admission, onward)     Start     Ordered   04/16/23 0001  Diet NPO time specified  Diet effective midnight        04/15/23 0825   04/15/23 0824  Diet full liquid Fluid consistency: Thin  Diet effective now       Question:  Fluid consistency:  Answer:  Thin   04/15/23 0823            Objective: Vitals:   04/14/23 0533 04/14/23 1115 04/14/23 2028 04/15/23 0409  BP: 119/74 128/84 126/75 (!) 91/58  Pulse: (!) 41 78 78 76  Resp: 14 16 18 17   Temp: (!) 97 F (36.1 C) 98.4 F (36.9 C) 98.2 F (36.8 C) 98.7 F (37.1 C)  TempSrc:  Oral    SpO2: 93% 97% 96% 92%  Weight:      Height:        Intake/Output Summary (Last 24 hours) at 04/15/2023 1123 Last data filed at 04/14/2023 1838 Gross per 24 hour  Intake 560 ml  Output --  Net 560 ml   Filed Weights   04/13/23 0747 04/13/23 1755  Weight: 97.5 kg 96.3 kg    Scheduled Meds:  folic acid  500 mcg Oral Daily   insulin aspart  0-15 Units Subcutaneous TID WC   insulin aspart  0-5 Units Subcutaneous QHS   pantoprazole (PROTONIX) IV  40 mg Intravenous Q12H   simvastatin  20 mg Oral QHS   tamsulosin  0.4  mg Oral QHS   vitamin B-12  100 mcg Oral Daily   Continuous Infusions:  Nutritional status     Body mass index is 29.61 kg/m.  Data Reviewed:   CBC: Recent Labs  Lab 04/13/23 0802 04/13/23 1705 04/14/23 0051 04/14/23 1034 04/14/23 1612 04/15/23 0552  WBC 9.0  --  7.4  --   --  8.1  HGB 14.3 13.2 12.7* 12.5* 13.1 12.5*  HCT 43.8 39.9 39.0 38.6* 41.0 39.0  MCV 94.8  --  95.1  --   --  96.1  PLT 154  --  137*  --   --  156   Basic Metabolic Panel: Recent Labs  Lab 04/13/23 0802 04/14/23 0051 04/15/23 0552  NA 139 138 137  K 3.7 3.8 4.0  CL 108 105 105  CO2 22 23 22   GLUCOSE 147* 109* 116*  BUN 31* 27* 26*  CREATININE 1.48* 1.29* 1.51*  CALCIUM 9.2 8.6* 8.9  MG  --   --  2.0   GFR: Estimated Creatinine Clearance: 37.7 mL/min (A) (by C-G formula based on  SCr of 1.51 mg/dL (H)). Liver Function Tests: Recent Labs  Lab 04/13/23 0802  AST 23  ALT 18  ALKPHOS 51  BILITOT 0.7  PROT 6.7  ALBUMIN 4.0   No results for input(s): "LIPASE", "AMYLASE" in the last 168 hours. No results for input(s): "AMMONIA" in the last 168 hours. Coagulation Profile: Recent Labs  Lab 04/13/23 0802 04/14/23 0051 04/15/23 0552  INR 2.8* 2.8* 2.6*   Cardiac Enzymes: No results for input(s): "CKTOTAL", "CKMB", "CKMBINDEX", "TROPONINI" in the last 168 hours. BNP (last 3 results) No results for input(s): "PROBNP" in the last 8760 hours. HbA1C: No results for input(s): "HGBA1C" in the last 72 hours. CBG: Recent Labs  Lab 04/14/23 0802 04/14/23 1115 04/14/23 1606 04/14/23 2105 04/15/23 0725  GLUCAP 106* 134* 160* 104* 107*   Lipid Profile: No results for input(s): "CHOL", "HDL", "LDLCALC", "TRIG", "CHOLHDL", "LDLDIRECT" in the last 72 hours. Thyroid Function Tests: No results for input(s): "TSH", "T4TOTAL", "FREET4", "T3FREE", "THYROIDAB" in the last 72 hours. Anemia Panel: No results for input(s): "VITAMINB12", "FOLATE", "FERRITIN", "TIBC", "IRON", "RETICCTPCT" in the last 72 hours. Sepsis Labs: No results for input(s): "PROCALCITON", "LATICACIDVEN" in the last 168 hours.  No results found for this or any previous visit (from the past 240 hours).       Radiology Studies: No results found.         LOS: 1 day   Time spent= 35 mins    Miguel Rota, MD [facility]  If 7PM-7AM, please contact night-coverage  04/15/2023, 11:23 AM

## 2023-04-15 NOTE — H&P (View-Only) (Signed)
 Patient not seen today.  Was supposed to have EGD for evaluation of GI bleed, but unfortunately INR is still too high at 2.6 today.  EGD has been postponed until 3/6.  Hemoglobin is stable.  He was placed on full liquid diet for today and then n.p.o. after midnight.

## 2023-04-15 NOTE — Anesthesia Preprocedure Evaluation (Signed)
 Anesthesia Evaluation  Patient identified by MRN, date of birth, ID band Patient awake    Reviewed: Allergy & Precautions, NPO status , Patient's Chart, lab work & pertinent test results  Airway Mallampati: III  TM Distance: >3 FB Neck ROM: Full    Dental  (+) Teeth Intact, Dental Advisory Given   Pulmonary shortness of breath and with exertion Snores at night   Pulmonary exam normal breath sounds clear to auscultation       Cardiovascular hypertension (140/76 preop), Pt. on medications + CAD, + Past MI, + CABG (2000), +CHF (LVEF 30-35%) and + DOE  Normal cardiovascular exam+ Cardiac Defibrillator (2005, s/p lead revision 2015)  Rhythm:Regular Rate:Normal  Echo 2024  1. Left ventricular ejection fraction, by estimation, is 30 to 35%. The  left ventricle has moderately decreased function. The left ventricle  demonstrates regional wall motion abnormalities (see scoring  diagram/findings for description). There is mild  left ventricular hypertrophy. Left ventricular diastolic parameters are  indeterminate.   2. Right ventricular systolic function is mildly reduced. The right  ventricular size is normal. There is normal pulmonary artery systolic  pressure. The estimated right ventricular systolic pressure is 24.7 mmHg.   3. The mitral valve is grossly normal. Trivial mitral valve  regurgitation. No evidence of mitral stenosis.   4. The aortic valve is grossly normal. There is mild thickening of the  aortic valve. Aortic valve regurgitation is not visualized. No aortic  stenosis is present.   5. The inferior vena cava is normal in size with greater than 50%  respiratory variability, suggesting right atrial pressure of 3 mmHg.   Cath 2019  Ost LAD to Prox LAD lesion is 100% stenosed.  Dist Cx lesion is 20% stenosed.  Mid RCA lesion is 20% stenosed.   LV thrombus on Coumadin   Neuro/Psych  PSYCHIATRIC DISORDERS Anxiety      negative neurological ROS     GI/Hepatic Neg liver ROS, PUD,GERD  Controlled and Medicated,,GIB   Endo/Other  diabetes, Well Controlled, Type 2, Oral Hypoglycemic Agents    Renal/GU Renal Insufficiency and CRFRenal diseaseCr 1.51, CKD 3b  negative genitourinary   Musculoskeletal  (+) Arthritis , Osteoarthritis,    Abdominal   Peds  Hematology  (+) Blood dyscrasia, anemia Hb 12.5, plt 156   Anesthesia Other Findings Coumadin- INR yesterday 2.6, so EGD postponed  Reproductive/Obstetrics negative OB ROS                             Anesthesia Physical Anesthesia Plan  ASA: 4  Anesthesia Plan: MAC   Post-op Pain Management:    Induction:   PONV Risk Score and Plan: 2 and Propofol infusion and TIVA  Airway Management Planned: Natural Airway and Simple Face Mask  Additional Equipment: None  Intra-op Plan:   Post-operative Plan:   Informed Consent: I have reviewed the patients History and Physical, chart, labs and discussed the procedure including the risks, benefits and alternatives for the proposed anesthesia with the patient or authorized representative who has indicated his/her understanding and acceptance.       Plan Discussed with: CRNA  Anesthesia Plan Comments:        Anesthesia Quick Evaluation

## 2023-04-16 ENCOUNTER — Encounter (HOSPITAL_COMMUNITY)
Admission: EM | Disposition: A | Discharge status: Home / Self Care | Payer: Self-pay | Source: Home / Self Care | Attending: Internal Medicine

## 2023-04-16 ENCOUNTER — Inpatient Hospital Stay (HOSPITAL_COMMUNITY): Payer: Self-pay | Admitting: Anesthesiology

## 2023-04-16 ENCOUNTER — Encounter (HOSPITAL_COMMUNITY): Payer: Medicare Other | Admitting: Internal Medicine

## 2023-04-16 ENCOUNTER — Encounter (HOSPITAL_COMMUNITY): Payer: Self-pay | Admitting: Internal Medicine

## 2023-04-16 DIAGNOSIS — K222 Esophageal obstruction: Secondary | ICD-10-CM

## 2023-04-16 DIAGNOSIS — K449 Diaphragmatic hernia without obstruction or gangrene: Secondary | ICD-10-CM

## 2023-04-16 DIAGNOSIS — K2961 Other gastritis with bleeding: Secondary | ICD-10-CM

## 2023-04-16 DIAGNOSIS — K317 Polyp of stomach and duodenum: Principal | ICD-10-CM

## 2023-04-16 DIAGNOSIS — K648 Other hemorrhoids: Secondary | ICD-10-CM | POA: Diagnosis not present

## 2023-04-16 DIAGNOSIS — K5733 Diverticulitis of large intestine without perforation or abscess with bleeding: Secondary | ICD-10-CM | POA: Diagnosis not present

## 2023-04-16 DIAGNOSIS — K259 Gastric ulcer, unspecified as acute or chronic, without hemorrhage or perforation: Secondary | ICD-10-CM | POA: Diagnosis not present

## 2023-04-16 DIAGNOSIS — K921 Melena: Secondary | ICD-10-CM | POA: Diagnosis not present

## 2023-04-16 DIAGNOSIS — K625 Hemorrhage of anus and rectum: Secondary | ICD-10-CM

## 2023-04-16 DIAGNOSIS — K5730 Diverticulosis of large intestine without perforation or abscess without bleeding: Secondary | ICD-10-CM | POA: Diagnosis not present

## 2023-04-16 LAB — GLUCOSE, CAPILLARY
Glucose-Capillary: 95 mg/dL (ref 70–99)
Glucose-Capillary: 139 mg/dL — ABNORMAL HIGH (ref 70–99)
Glucose-Capillary: 111 mg/dL — ABNORMAL HIGH (ref 70–99)
Glucose-Capillary: 133 mg/dL — ABNORMAL HIGH (ref 70–99)

## 2023-04-16 LAB — PROTIME-INR
INR: 1.3 — ABNORMAL HIGH (ref 0.8–1.2)
Prothrombin Time: 16.8 s — ABNORMAL HIGH (ref 11.4–15.2)

## 2023-04-16 LAB — BASIC METABOLIC PANEL
Anion gap: 9 (ref 5–15)
BUN: 24 mg/dL — ABNORMAL HIGH (ref 8–23)
CO2: 24 mmol/L (ref 22–32)
Calcium: 9.1 mg/dL (ref 8.9–10.3)
Chloride: 106 mmol/L (ref 98–111)
Creatinine, Ser: 1.51 mg/dL — ABNORMAL HIGH (ref 0.61–1.24)
GFR, Estimated: 43 mL/min — ABNORMAL LOW (ref >60)
Glucose, Bld: 107 mg/dL — ABNORMAL HIGH (ref 70–99)
Potassium: 3.8 mmol/L (ref 3.5–5.1)
Sodium: 139 mmol/L (ref 135–145)

## 2023-04-16 LAB — CBC
HCT: 36.1 % — ABNORMAL LOW (ref 39.0–52.0)
Hemoglobin: 12.1 g/dL — ABNORMAL LOW (ref 13.0–17.0)
MCH: 31.3 pg (ref 26.0–34.0)
MCHC: 33.5 g/dL (ref 30.0–36.0)
MCV: 93.3 fL (ref 80.0–100.0)
Platelets: 139 10*3/uL — ABNORMAL LOW (ref 150–400)
RBC: 3.87 MIL/uL — ABNORMAL LOW (ref 4.22–5.81)
RDW: 12.2 % (ref 11.5–15.5)
WBC: 7.8 10*3/uL (ref 4.0–10.5)
nRBC: 0 % (ref 0.0–0.2)

## 2023-04-16 SURGERY — ENTEROSCOPY
Anesthesia: Monitor Anesthesia Care

## 2023-04-16 MED ORDER — NA SULFATE-K SULFATE-MG SULF 17.5-3.13-1.6 GM/177ML PO SOLN
0.5000 | Freq: Once | ORAL | Status: AC
  Administered 2023-04-16: 177 mL via ORAL
  Filled 2023-04-16: qty 1

## 2023-04-16 MED ORDER — LIDOCAINE 2% (20 MG/ML) 5 ML SYRINGE
INTRAMUSCULAR | Status: DC | PRN
  Administered 2023-04-16: 80 mg via INTRAVENOUS

## 2023-04-16 MED ORDER — SODIUM CHLORIDE 0.9 % IV SOLN
INTRAVENOUS | Status: DC | PRN
Start: 2023-04-16 — End: 2023-04-16

## 2023-04-16 MED ORDER — PHENYLEPHRINE 80 MCG/ML (10ML) SYRINGE FOR IV PUSH (FOR BLOOD PRESSURE SUPPORT)
PREFILLED_SYRINGE | INTRAVENOUS | Status: DC | PRN
  Administered 2023-04-16: 80 ug via INTRAVENOUS
  Administered 2023-04-16 (×4): 160 ug via INTRAVENOUS

## 2023-04-16 MED ORDER — SODIUM CHLORIDE 0.9 % IV SOLN
INTRAVENOUS | Status: AC
  Administered 2023-04-16: 10 mL/h via INTRAVENOUS

## 2023-04-16 MED ORDER — EPHEDRINE SULFATE (PRESSORS) 50 MG/ML IJ SOLN
INTRAMUSCULAR | Status: DC | PRN
  Administered 2023-04-16: 5 mg via INTRAVENOUS

## 2023-04-16 MED ORDER — PROPOFOL 500 MG/50ML IV EMUL
INTRAVENOUS | Status: DC | PRN
  Administered 2023-04-16: 150 ug/kg/min via INTRAVENOUS

## 2023-04-16 MED ORDER — PROPOFOL 10 MG/ML IV BOLUS
INTRAVENOUS | Status: DC | PRN
  Administered 2023-04-16: 20 mg via INTRAVENOUS

## 2023-04-16 MED ORDER — METOCLOPRAMIDE HCL 5 MG/ML IJ SOLN
10.0000 mg | Freq: Once | INTRAMUSCULAR | Status: AC
  Administered 2023-04-16: 10 mg via INTRAVENOUS
  Filled 2023-04-16: qty 2

## 2023-04-16 MED ORDER — NA SULFATE-K SULFATE-MG SULF 17.5-3.13-1.6 GM/177ML PO SOLN
0.5000 | Freq: Once | ORAL | Status: AC
  Administered 2023-04-17: 177 mL via ORAL

## 2023-04-16 NOTE — Interval H&P Note (Signed)
 History and Physical Interval Note:  04/16/2023 11:12 AM  Joseph Malone  has presented today for surgery, with the diagnosis of Gastrointestinal bleeding.  The various methods of treatment have been discussed with the patient and family. After consideration of risks, benefits and other options for treatment, the patient has consented to  Procedure(s): ENTEROSCOPY (N/A) FLEXIBLE SIGMOIDOSCOPY (N/A) as a surgical intervention.  The patient's history has been reviewed, patient examined, no change in status, stable for surgery.  I have reviewed the patient's chart and labs.  Questions were answered to the patient's satisfaction.     Stan Head

## 2023-04-16 NOTE — Op Note (Addendum)
 [facility] Patient Name: Joseph Malone Procedure Date: 04/16/2023 MRN: [id number] Attending MD: Iva Boop , MD, [id number] Date of Birth: Jul 24, 1930 CSN: [id number] Age: 89 Admit Type: Inpatient Procedure:                Flexible Sigmoidoscopy Indications:              Hematochezia, Melena - 3rd admit w/ bleeding since                            12/23 (colon w/ diverticulosis and hemorrhoids) EGD                            10/24 w/ multiple gastric ulcers - small Providers:                Iva Boop, MD, Rogue Jury, RN, Marja Kays, Technician Referring MD:              Medicines:                Monitored Anesthesia Care Complications:            No immediate complications. Estimated Blood Loss:     Estimated blood loss: none. Procedure:                Pre-Anesthesia Assessment:                           - Prior to the procedure, a History and Physical                            was performed, and patient medications and                            allergies were reviewed. The patient's tolerance of                            previous anesthesia was also reviewed. The risks                            and benefits of the procedure and the sedation                            options and risks were discussed with the patient.                            All questions were answered, and informed consent                            was obtained. Prior Anticoagulants: The patient                            last took Coumadin (warfarin) 4 days prior to the  procedure. ASA Grade Assessment: III - A patient                            with severe systemic disease. After reviewing the                            risks and benefits, the patient was deemed in                            satisfactory condition to undergo the procedure.                           - Prior to the procedure, a History and Physical                             was performed, and patient medications and                            allergies were reviewed. The patient's tolerance of                            previous anesthesia was also reviewed. The risks                            and benefits of the procedure and the sedation                            options and risks were discussed with the patient.                            All questions were answered, and informed consent                            was obtained. Prior Anticoagulants: The patient                            last took Coumadin (warfarin) 4 days prior to the                            procedure. ASA Grade Assessment: III - A patient                            with severe systemic disease. After reviewing the                            risks and benefits, the patient was deemed in                            satisfactory condition to undergo the procedure.                           After obtaining informed consent, the scope was  passed under direct vision. The [id number]-[id number]                            ([id number]) Olympus colonoscope was introduced                            through the anus and advanced to the the sigmoid                            colon. Scope In: 11:37:36 AM Scope Out: 11:42:31 AM Total Procedure Duration: 0 hours 4 minutes 55 seconds  Findings:      The perianal and digital rectal examinations were normal.      Dark adherent blood and some maroon stool was found in the rectum and in       the sigmoid colon.      Multiple diverticula were found in the sigmoid colon.      Internal hemorrhoids were found. Impression:               - Blood in the rectum and in the sigmoid colon.                           - Diverticulosis in the sigmoid colon.                           - Internal hemorrhoids. Do not think these are the                            cause of bleeding.                           - No specimens  collected. Moderate Sedation:      Not Applicable - Patient had care per Anesthesia. Recommendation:           - Could be diverticular hemorrhage but not sure.                           I think for completemess and since he needs                            warfarin repeat colonoscopy needed. Will discuss w/                            patient. I did and will schedule for tomorrow PM                           Tiny gastric ulcers do not appear to be cause and                            enteroscopy w/o other source just prior to this.                           stay off warfarin for now Procedure Code(s):        --- Professional ---  16109, Sigmoidoscopy, flexible; diagnostic,                            including collection of specimen(s) by brushing or                            washing, when performed (separate procedure) Diagnosis Code(s):        --- Professional ---                           K62.5, Hemorrhage of anus and rectum                           K92.2, Gastrointestinal hemorrhage, unspecified                           K92.1, Melena (includes Hematochezia)                           K57.30, Diverticulosis of large intestine without                            perforation or abscess without bleeding CPT copyright 2022 [employer]. All rights reserved. The codes documented in this report are preliminary and upon coder review may  be revised to meet current compliance requirements. Iva Boop, MD 04/16/2023 12:11:23 PM This report has been signed electronically. Number of Addenda: 0

## 2023-04-16 NOTE — Plan of Care (Signed)
   Problem: Coping: Goal: Ability to adjust to condition or change in health will improve Outcome: Progressing

## 2023-04-16 NOTE — Progress Notes (Signed)
 PROGRESS NOTE    Joseph Malone  ACZ:[id number] DOB: [birth year] DOA: 04/13/2023 PCP: Shelva Majestic, MD    Brief Narrative:   89 year old with history of BPH, CAD status post CABG, CHF with reduced EF with ICD in place, LV thrombus on Coumadin, HTN, HLD, DM2, CKD 3B comes to the hospital with complaints of bright red blood per rectum with some dark stools.  INR therapeutic on Coumadin.  INR is significantly down trended.  Hopefully GI will perform endoscopy today  Assessment & Plan:  Active Problems:   Hematochezia   Bright red blood per rectum - Unclear source at this time.  Coumadin remains on hold.  INR has trended down 1.3.  Hopefully GI will perform endoscopy today  LV thrombus - On Coumadin with therapeutic INR.  Currently on hold.  CKD stage II -Creatinine stable at 1.2  Diabetes mellitus type 2 -Sliding scale and Accu-Chek  Congestive heart failure with reduced EF, EF 30% Status post Medtronic ICD CAD status post CABG in 2001 -On aspirin Aldactone which is currently on hold   GERD -PPI  Hyperlipidemia -Zocor  BPH -Flomax   DVT prophylaxis: SCDs Start: 04/13/23 1057    Code Status: Full Code Family Communication: I will call his daughter Ongoing evaluation for GI bleeding    Subjective: No complaints.  EGD today   Examination:  General exam: Appears calm and comfortable  Respiratory system: Clear to auscultation. Respiratory effort normal. Cardiovascular system: S1 & S2 heard, RRR. No JVD, murmurs, rubs, gallops or clicks. No pedal edema. Gastrointestinal system: Abdomen is nondistended, soft and nontender. No organomegaly or masses felt. Normal bowel sounds heard. Central nervous system: Alert and oriented. No focal neurological deficits. Extremities: Symmetric 5 x 5 power. Skin: No rashes, lesions or ulcers Psychiatry: Judgement and insight appear normal. Mood & affect appropriate.                Diet Orders (From admission,  onward)     Start     Ordered   04/16/23 0001  Diet NPO time specified  Diet effective midnight        04/15/23 0825            Objective: Vitals:   04/15/23 1247 04/15/23 2128 04/16/23 0518 04/16/23 0948  BP: 114/67 129/61 118/66 (!) 159/98  Pulse: 84 75 76 84  Resp: 16 14 14 20   Temp: (!) 97.4 F (36.3 C) 97.8 F (36.6 C) 97.8 F (36.6 C) 98.5 F (36.9 C)  TempSrc: Oral Oral Oral Tympanic  SpO2: 97% 95% 92% 98%  Weight:    96.3 kg  Height:    5\' 11"  (1.803 m)    Intake/Output Summary (Last 24 hours) at 04/16/2023 1103 Last data filed at 04/16/2023 0739 Gross per 24 hour  Intake 540.1 ml  Output 600 ml  Net -59.9 ml   Filed Weights   04/13/23 0747 04/13/23 1755 04/16/23 0948  Weight: 97.5 kg 96.3 kg 96.3 kg    Scheduled Meds:  [MAR Hold] folic acid  500 mcg Oral Daily   [MAR Hold] insulin aspart  0-15 Units Subcutaneous TID WC   [MAR Hold] insulin aspart  0-5 Units Subcutaneous QHS   [MAR Hold] pantoprazole (PROTONIX) IV  40 mg Intravenous Q12H   [MAR Hold] simvastatin  20 mg Oral QHS   [MAR Hold] tamsulosin  0.4 mg Oral QHS   [MAR Hold] vitamin B-12  100 mcg Oral Daily   Continuous Infusions:  Nutritional status  Body mass index is 29.61 kg/m.  Data Reviewed:   CBC: Recent Labs  Lab 04/13/23 0802 04/13/23 1705 04/14/23 0051 04/14/23 1034 04/14/23 1612 04/15/23 0552 04/16/23 0819  WBC 9.0  --  7.4  --   --  8.1 7.8  HGB 14.3   < > 12.7* 12.5* 13.1 12.5* 12.1*  HCT 43.8   < > 39.0 38.6* 41.0 39.0 36.1*  MCV 94.8  --  95.1  --   --  96.1 93.3  PLT 154  --  137*  --   --  156 139*   < > = values in this interval not displayed.   Basic Metabolic Panel: Recent Labs  Lab 04/13/23 0802 04/14/23 0051 04/15/23 0552 04/16/23 0534  NA 139 138 137 139  K 3.7 3.8 4.0 3.8  CL 108 105 105 106  CO2 22 23 22 24   GLUCOSE 147* 109* 116* 107*  BUN 31* 27* 26* 24*  CREATININE 1.48* 1.29* 1.51* 1.51*  CALCIUM 9.2 8.6* 8.9 9.1  MG  --   --  2.0  --     GFR: Estimated Creatinine Clearance: 37.7 mL/min (A) (by C-G formula based on SCr of 1.51 mg/dL (H)). Liver Function Tests: Recent Labs  Lab 04/13/23 0802  AST 23  ALT 18  ALKPHOS 51  BILITOT 0.7  PROT 6.7  ALBUMIN 4.0   No results for input(s): "LIPASE", "AMYLASE" in the last 168 hours. No results for input(s): "AMMONIA" in the last 168 hours. Coagulation Profile: Recent Labs  Lab 04/13/23 0802 04/14/23 0051 04/15/23 0552 04/16/23 0534  INR 2.8* 2.8* 2.6* 1.3*   Cardiac Enzymes: No results for input(s): "CKTOTAL", "CKMB", "CKMBINDEX", "TROPONINI" in the last 168 hours. BNP (last 3 results) No results for input(s): "PROBNP" in the last 8760 hours. HbA1C: No results for input(s): "HGBA1C" in the last 72 hours. CBG: Recent Labs  Lab 04/15/23 0725 04/15/23 1136 04/15/23 1603 04/15/23 2129 04/16/23 0734  GLUCAP 107* 191* 98 106* 95   Lipid Profile: No results for input(s): "CHOL", "HDL", "LDLCALC", "TRIG", "CHOLHDL", "LDLDIRECT" in the last 72 hours. Thyroid Function Tests: No results for input(s): "TSH", "T4TOTAL", "FREET4", "T3FREE", "THYROIDAB" in the last 72 hours. Anemia Panel: No results for input(s): "VITAMINB12", "FOLATE", "FERRITIN", "TIBC", "IRON", "RETICCTPCT" in the last 72 hours. Sepsis Labs: No results for input(s): "PROCALCITON", "LATICACIDVEN" in the last 168 hours.  No results found for this or any previous visit (from the past 240 hours).       Radiology Studies: No results found.         LOS: 2 days   Time spent= 35 mins    Miguel Rota, MD [facility]  If 7PM-7AM, please contact night-coverage  04/16/2023, 11:03 AM

## 2023-04-16 NOTE — Op Note (Signed)
 [facility] Patient Name: Joseph Malone Procedure Date: 04/16/2023 MRN: [id number] Attending MD: Iva Boop , MD, [id number] Date of Birth: [birth year] CSN: [id number] Age: 89 Admit Type: Inpatient Procedure:                Small bowel enteroscopy Indications:              Hematochezia, Melena on warfarin - 3rd admit for GI                            bleeding since 01/2022 - L diverticulosis on                            colonoscopy 01/2022 and multiple small gastric                            ulcers EGD 10/24 Providers:                Iva Boop, MD, Rogue Jury, RN, Marja Kays, Technician Referring MD:              Medicines:                Monitored Anesthesia Care Complications:            No immediate complications. Estimated Blood Loss:     Estimated blood loss was minimal. Procedure:                Pre-Anesthesia Assessment:                           - Prior to the procedure, a History and Physical                            was performed, and patient medications and                            allergies were reviewed. The patient's tolerance of                            previous anesthesia was also reviewed. The risks                            and benefits of the procedure and the sedation                            options and risks were discussed with the patient.                            All questions were answered, and informed consent                            was obtained. Prior Anticoagulants: The patient  last took Coumadin (warfarin) 4 days prior to the                            procedure. ASA Grade Assessment: III - A patient                            with severe systemic disease. After reviewing the                            risks and benefits, the patient was deemed in                            satisfactory condition to undergo the procedure.                           After  obtaining informed consent, the endoscope was                            passed under direct vision. Throughout the                            procedure, the patient's blood pressure, pulse, and                            oxygen saturations were monitored continuously. The                            PCF-HQ190L ([id number]) Olympus colonoscope was                            introduced through the mouth and advanced to the                            proximal jejunum. The small bowel enteroscopy was                            accomplished without difficulty. The patient                            tolerated the procedure well. Scope In: Scope Out: Findings:      A single medium sessile polyp was found in the prepyloric region of the       stomach. Biopsies were taken with a cold forceps for histology.       Verification of patient identification for the specimen was done.       Estimated blood loss was minimal.      Four non-bleeding superficial gastric ulcers with no stigmata of       bleeding were found in the gastric antrum. The largest lesion was 3 mm       in largest dimension.      A small hiatal hernia was present.      A non-obstructing Schatzki ring was found at the gastroesophageal       junction.      The examined duodenum was normal.      There was  no evidence of significant pathology in the entire examined       portion of jejunum. Impression:               - A single gastric polyp vs enlarged fold but                            mucosa suggests adenomatous change, possibly                            Biopsied.                           - Non-bleeding gastric ulcers with no stigmata of                            bleeding. 3 on the polyp and one opposite antral                            wall - do not look like they are causing bleeding                           - Small hiatal hernia.                           - Non-obstructing Schatzki ring.                           - Normal  examined duodenum.                           - The examined portion of the jejunum was normal. Recommendation:           - Await pathology results.                           - Sigmoidoscopy next - I do not think these                            findings explain melena/hematochezia Procedure Code(s):        --- Professional ---                           [phone], Small intestinal endoscopy, enteroscopy                            beyond second portion of duodenum, not including                            ileum; with biopsy, single or multiple Diagnosis Code(s):        --- Professional ---                           K31.7, Polyp of stomach and duodenum                           K25.9, Gastric ulcer, unspecified as  acute or                            chronic, without hemorrhage or perforation                           K44.9, Diaphragmatic hernia without obstruction or                            gangrene                           K22.2, Esophageal obstruction                           K92.1, Melena (includes Hematochezia) CPT copyright 2022 American Medical Association. All rights reserved. The codes documented in this report are preliminary and upon coder review may  be revised to meet current compliance requirements. Iva Boop, MD 04/16/2023 12:06:00 PM This report has been signed electronically. Number of Addenda: 0

## 2023-04-16 NOTE — Anesthesia Postprocedure Evaluation (Signed)
 Anesthesia Post Note  Patient: Joseph Malone  Procedure(s) Performed: ENTEROSCOPY FLEXIBLE SIGMOIDOSCOPY     Patient location during evaluation: PACU Anesthesia Type: MAC Level of consciousness: awake and alert Pain management: pain level controlled Vital Signs Assessment: post-procedure vital signs reviewed and stable Respiratory status: spontaneous breathing, nonlabored ventilation and respiratory function stable Cardiovascular status: blood pressure returned to baseline and stable Postop Assessment: no apparent nausea or vomiting Anesthetic complications: no   No notable events documented.  Last Vitals:  Vitals:   04/16/23 1155 04/16/23 1200  BP: (!) 97/57 (!) (P) 116/43  Pulse: (!) 109 96  Resp: 20 19  Temp:    SpO2: 98% 97%    Last Pain:  Vitals:   04/16/23 1200  TempSrc:   PainSc: (P) 0-No pain                 Tennis Must Lloyde [place]lam

## 2023-04-16 NOTE — Transfer of Care (Signed)
 Immediate Anesthesia Transfer of Care Note  Patient: Joseph Malone  Procedure(s) Performed: ENTEROSCOPY FLEXIBLE SIGMOIDOSCOPY  Patient Location: Endoscopy Unit  Anesthesia Type:General  Level of Consciousness: drowsy  Airway & Oxygen Therapy: Patient Spontanous Breathing  Post-op Assessment: Report given to RN and Post -op Vital signs reviewed and stable  Post vital signs: Reviewed and stable  Last Vitals:  Vitals Value Taken Time  BP 97/57 04/16/23 1153  Temp    Pulse 109 04/16/23 1154  Resp 20 04/16/23 1154  SpO2 98 % 04/16/23 1154  Vitals shown include unfiled device data.  Last Pain:  Vitals:   04/16/23 0948  TempSrc: Tympanic  PainSc: 0-No pain      Patients Stated Pain Goal: 0 (04/14/23 1037)  Complications: No notable events documented.

## 2023-04-17 ENCOUNTER — Encounter (HOSPITAL_COMMUNITY): Payer: Self-pay | Admitting: Internal Medicine

## 2023-04-17 ENCOUNTER — Inpatient Hospital Stay (HOSPITAL_COMMUNITY): Admitting: Anesthesiology

## 2023-04-17 ENCOUNTER — Encounter: Payer: Self-pay | Admitting: Internal Medicine

## 2023-04-17 ENCOUNTER — Encounter (HOSPITAL_COMMUNITY)
Admission: EM | Disposition: A | Discharge status: Home / Self Care | Payer: Self-pay | Source: Home / Self Care | Attending: Internal Medicine

## 2023-04-17 DIAGNOSIS — K5730 Diverticulosis of large intestine without perforation or abscess without bleeding: Secondary | ICD-10-CM

## 2023-04-17 DIAGNOSIS — K922 Gastrointestinal hemorrhage, unspecified: Secondary | ICD-10-CM | POA: Diagnosis not present

## 2023-04-17 DIAGNOSIS — K648 Other hemorrhoids: Secondary | ICD-10-CM

## 2023-04-17 DIAGNOSIS — K635 Polyp of colon: Secondary | ICD-10-CM

## 2023-04-17 DIAGNOSIS — K5140 Inflammatory polyps of colon without complications: Secondary | ICD-10-CM | POA: Diagnosis not present

## 2023-04-17 DIAGNOSIS — D125 Benign neoplasm of sigmoid colon: Secondary | ICD-10-CM

## 2023-04-17 DIAGNOSIS — K920 Hematemesis: Secondary | ICD-10-CM

## 2023-04-17 DIAGNOSIS — D120 Benign neoplasm of cecum: Secondary | ICD-10-CM | POA: Diagnosis not present

## 2023-04-17 DIAGNOSIS — I2510 Atherosclerotic heart disease of native coronary artery without angina pectoris: Secondary | ICD-10-CM

## 2023-04-17 DIAGNOSIS — K5731 Diverticulosis of large intestine without perforation or abscess with bleeding: Secondary | ICD-10-CM

## 2023-04-17 LAB — BASIC METABOLIC PANEL
Anion gap: 13 (ref 5–15)
BUN: 21 mg/dL (ref 8–23)
CO2: 24 mmol/L (ref 22–32)
Calcium: 8.4 mg/dL — ABNORMAL LOW (ref 8.9–10.3)
Chloride: 106 mmol/L (ref 98–111)
Creatinine, Ser: 1.47 mg/dL — ABNORMAL HIGH (ref 0.61–1.24)
GFR, Estimated: 45 mL/min — ABNORMAL LOW (ref >60)
Glucose, Bld: 113 mg/dL — ABNORMAL HIGH (ref 70–99)
Potassium: 3.6 mmol/L (ref 3.5–5.1)
Sodium: 143 mmol/L (ref 135–145)

## 2023-04-17 LAB — CBC
HCT: 34.3 % — ABNORMAL LOW (ref 39.0–52.0)
Hemoglobin: 11.3 g/dL — ABNORMAL LOW (ref 13.0–17.0)
MCH: 31 pg (ref 26.0–34.0)
MCHC: 32.9 g/dL (ref 30.0–36.0)
MCV: 94.2 fL (ref 80.0–100.0)
Platelets: 145 10*3/uL — ABNORMAL LOW (ref 150–400)
RBC: 3.64 MIL/uL — ABNORMAL LOW (ref 4.22–5.81)
RDW: 12.2 % (ref 11.5–15.5)
WBC: 7.2 10*3/uL (ref 4.0–10.5)
nRBC: 0 % (ref 0.0–0.2)

## 2023-04-17 LAB — GLUCOSE, CAPILLARY
Glucose-Capillary: 142 mg/dL — ABNORMAL HIGH (ref 70–99)
Glucose-Capillary: 125 mg/dL — ABNORMAL HIGH (ref 70–99)
Glucose-Capillary: 193 mg/dL — ABNORMAL HIGH (ref 70–99)
Glucose-Capillary: 110 mg/dL — ABNORMAL HIGH (ref 70–99)

## 2023-04-17 LAB — SURGICAL PATHOLOGY

## 2023-04-17 SURGERY — COLONOSCOPY
Anesthesia: Monitor Anesthesia Care

## 2023-04-17 MED ORDER — PROPOFOL 10 MG/ML IV BOLUS
INTRAVENOUS | Status: DC | PRN
  Administered 2023-04-17: 20 mg via INTRAVENOUS

## 2023-04-17 MED ORDER — BISACODYL 5 MG PO TBEC: 20.0000 mg | DELAYED_RELEASE_TABLET | Freq: Once | ORAL | Status: DC

## 2023-04-17 MED ORDER — PROPOFOL 1000 MG/100ML IV EMUL
INTRAVENOUS | Status: AC
  Filled 2023-04-17: qty 100

## 2023-04-17 MED ORDER — PHENYLEPHRINE 80 MCG/ML (10ML) SYRINGE FOR IV PUSH (FOR BLOOD PRESSURE SUPPORT)
PREFILLED_SYRINGE | INTRAVENOUS | Status: DC | PRN
  Administered 2023-04-17 (×2): 80 ug via INTRAVENOUS

## 2023-04-17 MED ORDER — SODIUM CHLORIDE 0.9 % IV SOLN: INTRAVENOUS | Status: DC | PRN

## 2023-04-17 MED ORDER — PROPOFOL 500 MG/50ML IV EMUL
INTRAVENOUS | Status: DC | PRN
  Administered 2023-04-17: 100 ug/kg/min via INTRAVENOUS

## 2023-04-17 NOTE — Anesthesia Preprocedure Evaluation (Signed)
 Anesthesia Evaluation  Patient identified by MRN, date of birth, ID band Patient awake    Reviewed: Allergy & Precautions, NPO status , Patient's Chart, lab work & pertinent test results  History of Anesthesia Complications Negative for: history of anesthetic complications  Airway Mallampati: II  TM Distance: >3 FB Neck ROM: Full    Dental  (+) Dental Advisory Given   Pulmonary neg pulmonary ROS   Pulmonary exam normal        Cardiovascular hypertension, Pt. on medications + CAD, + Past MI, + CABG and +CHF  + dysrhythmias Atrial Fibrillation + pacemaker + Cardiac Defibrillator  Rhythm:Irregular Rate:Normal   '24 TTE - EF 30 to 35%. There is mild left ventricular hypertrophy. Right ventricular systolic function is mildly reduced. Trivial mitral valve regurgitation.     Neuro/Psych  PSYCHIATRIC DISORDERS Anxiety     negative neurological ROS     GI/Hepatic Neg liver ROS, PUD,GERD  Medicated and Controlled,,  Endo/Other  diabetes, Type 2, Oral Hypoglycemic Agents    Renal/GU CRFRenal disease     Musculoskeletal  (+) Arthritis ,    Abdominal   Peds  Hematology  (+) Blood dyscrasia, anemia  On coumadin Plt 145k INR 1.3 yesterday    Anesthesia Other Findings   Reproductive/Obstetrics                              Anesthesia Physical Anesthesia Plan  ASA: 4  Anesthesia Plan: MAC   Post-op Pain Management: Minimal or no pain anticipated   Induction:   PONV Risk Score and Plan: 1 and Propofol infusion and Treatment may vary due to age or medical condition  Airway Management Planned: Natural Airway and Simple Face Mask  Additional Equipment: None  Intra-op Plan:   Post-operative Plan:   Informed Consent: I have reviewed the patients History and Physical, chart, labs and discussed the procedure including the risks, benefits and alternatives for the proposed anesthesia with the  patient or authorized representative who has indicated his/her understanding and acceptance.       Plan Discussed with: CRNA and Anesthesiologist  Anesthesia Plan Comments:          Anesthesia Quick Evaluation

## 2023-04-17 NOTE — Op Note (Signed)
 [facility] Patient Name: Joseph Malone Procedure Date: 04/17/2023 MRN: [id number] Attending MD: Wilhemina Bonito. Marina Goodell , MD, [id number] Date of Birth: Jul 07, 1930 CSN: [id number] Age: 89 Admit Type: Inpatient Procedure:                Colonoscopy with cold snare polypectomy x 2 Indications:              Hematochezia Providers:                Wilhemina Bonito. Marina Goodell, MD, Lorenza Evangelist, RN, Rozetta Nunnery, Technician Referring MD:             [facility] Medicines:                Monitored Anesthesia Care Complications:            No immediate complications. Estimated blood loss:                            None. Estimated Blood Loss:     Estimated blood loss: none. Procedure:                Pre-Anesthesia Assessment:                           - Prior to the procedure, a History and Physical                            was performed, and patient medications and                            allergies were reviewed. The patient's tolerance of                            previous anesthesia was also reviewed. The risks                            and benefits of the procedure and the sedation                            options and risks were discussed with the patient.                            All questions were answered, and informed consent                            was obtained. Prior Anticoagulants: The patient has                            taken Coumadin, last dose was 4 days prior to                            procedure. ASA Grade Assessment: III - A patient  with severe systemic disease. After reviewing the                            risks and benefits, the patient was deemed in                            satisfactory condition to undergo the procedure.                           After obtaining informed consent, the colonoscope                            was passed under direct vision. Throughout the                             procedure, the patient's blood pressure, pulse, and                            oxygen saturations were monitored continuously. The                            CF-HQ190L ([id number]) Olympus colonoscope was                            introduced through the anus and advanced to the the                            cecum, identified by appendiceal orifice and                            ileocecal valve. The terminal ileum, ileocecal                            valve, appendiceal orifice, and rectum were                            photographed. The quality of the bowel preparation                            was excellent. The colonoscopy was performed                            without difficulty. The patient tolerated the                            procedure well. The bowel preparation used was                            SUPREP via split dose instruction. Scope In: 2:31:32 PM Scope Out: 2:50:49 PM Scope Withdrawal Time: 0 hours 15 minutes 44 seconds  Total Procedure Duration: 0 hours 19 minutes 17 seconds  Findings:      The terminal ileum appeared normal. No blood or bleeding.      Two polyps were found in the sigmoid colon and cecum.  The polyps were 4       to 6 mm in size. These polyps were removed with a cold snare. Resection       and retrieval were complete. The sigmoid colon polyp was friable. There       was some oozing post polypectomy that was treated with soft coagulation.      Many diverticula were found in the left colon.      Internal hemorrhoids were found during retroflexion. The hemorrhoids       were moderate but not bleeding.      The exam was otherwise without abnormality on direct and retroflexion       views. Impression:               - The examined portion of the ileum was normal.                           - Two 4 to 6 mm polyps in the sigmoid colon and in                            the cecum, removed with a cold snare. Resected and                            retrieved.                            - Diverticulosis in the left colon.                           - Internal hemorrhoids.                           - The examination was otherwise normal on direct                            and retroflexion views.                           ?" SUSPECT SELF-limited DIVERTICULAR BLEED Moderate Sedation:      none Recommendation:           1. Regular diet                           2. Standard post colonoscopy care                           3. Follow-up pathology                           4. Okay to resume Coumadin tomorrow                           5. If no issues, may be discharged home tomorrow.                           These findings and recommendations were discussed  with the patient. He was provided a copy of this                            report.. Procedure Code(s):        --- Professional ---                           [phone], Colonoscopy, flexible; diagnostic, including                            collection of specimen(s) by brushing or washing,                            when performed (separate procedure) Diagnosis Code(s):        --- Professional ---                           K92.1, Melena (includes Hematochezia) CPT copyright 2022 American Medical Association. All rights reserved. The codes documented in this report are preliminary and upon coder review may  be revised to meet current compliance requirements. Wilhemina Bonito. Marina Goodell, MD 04/17/2023 3:09:03 PM This report has been signed electronically. Number of Addenda: 0

## 2023-04-17 NOTE — Progress Notes (Signed)
   Gastric polyp biopsy was hyperplastic polyp  No further f/u needed.  Iva Boop, MD, Clementeen Graham

## 2023-04-17 NOTE — Anesthesia Procedure Notes (Signed)
 Procedure Name: MAC Date/Time: 04/17/2023 2:22 PM  Performed by: Nelle Don, CRNAPre-anesthesia Checklist: Emergency Drugs available, Patient identified, Suction available and Patient being monitored Oxygen Delivery Method: Simple face mask

## 2023-04-17 NOTE — Progress Notes (Signed)
 PROGRESS NOTE    BARAN KUHRT  UJW:[id number] DOB: 1931-02-01 DOA: 04/13/2023 PCP: Shelva Majestic, MD  Brief Narrative:  This 89 year old Male with PMH significant for BPH, CAD status post CABG, CHF with reduced EF with ICD in place, LV thrombus on Coumadin, HTN, HLD, DM2, CKD 3B comes to the hospital with complaints of bright red blood per rectum with some dark stools. INR therapeutic on Coumadin. INR is significantly down trended.  Patient is scheduled to have colonoscopy.  Assessment & Plan:   Principal Problem:   GI bleed Active Problems:   Hematochezia  Bright red blood per rectum: Patient presented with bright red blood /rectum. Unclear source at this time. Coumadin remains on hold.   INR has trended down 1.3.  Patient is scheduled to have colonoscopy today. H&H remains stable.   LV thrombus: On Coumadin with therapeutic INR.  Currently on hold. Resume Coumadin when cleared by GI.   CKD stage II: Creatinine stable at 1.2. Avoid nephrotoxic medications.   Diabetes mellitus type 2 Sliding scale and Accu-Chek   Congestive heart failure with reduced EF, EF 30% Status post [employer] ICD CAD status post CABG in 2001 Continue aspirin, Aldactone which is currently on hold.     GERD: Continue pantoprazole 40 mg daily   Hyperlipidemia Continue Zocor 20 mg daily.   BPH: Continue Flomax 0.4 mg daily   DVT prophylaxis: SCDS Code Status: Full code Family Communication:No family at bed side Disposition Plan:    Status is: Inpatient Remains inpatient appropriate because: Admitted for GI bleed , underwent colonoscopy.    Consultants:  Gastroenterology  Procedures: Colonoscopy  Antimicrobials:  Anti-infectives (From admission, onward)    None      Subjective: Patient was seen and examined at bedside.  Overnight events noted.   Patient reports he is anticipating colonoscopy today. Sigmoidoscopy yesterday did not found to be  anything  Objective: Vitals:   04/16/23 1240 04/16/23 1822 04/16/23 2100 04/17/23 0457  BP: 126/80 123/61 130/82 112/67  Pulse: 87 82 79 89  Resp: 18 14 18 20   Temp: (!) 97.5 F (36.4 C) (!) 97.5 F (36.4 C) 97.7 F (36.5 C) 98.2 F (36.8 C)  TempSrc: Oral Oral Oral Oral  SpO2: 98% 94% 96% 94%  Weight:      Height:        Intake/Output Summary (Last 24 hours) at 04/17/2023 1217 Last data filed at 04/17/2023 0547 Gross per 24 hour  Intake 2928.21 ml  Output 460 ml  Net 2468.21 ml   Filed Weights   04/13/23 0747 04/13/23 1755 04/16/23 0948  Weight: 97.5 kg 96.3 kg 96.3 kg    Examination:  General exam: Appears calm and comfortable, not in any acute distress. Respiratory system: Clear to auscultation. Respiratory effort normal. RR 15 Cardiovascular system: S1 & S2 heard, RRR. No JVD, murmurs, rubs, gallops or clicks. Gastrointestinal system: Abdomen is non distended, soft and non tender. Normal bowel sounds heard. Central nervous system: Alert and oriented X 3. No focal neurological deficits. Extremities: No edema, No cyanosis, No clubbing  Skin: No rashes, lesions or ulcers Psychiatry: Judgement and insight appear normal. Mood & affect appropriate.     Data Reviewed: I have personally reviewed following labs and imaging studies  CBC: Recent Labs  Lab 04/13/23 0802 04/13/23 1705 04/14/23 0051 04/14/23 1034 04/14/23 1612 04/15/23 0552 04/16/23 0819 04/17/23 0447  WBC 9.0  --  7.4  --   --  8.1 7.8 7.2  HGB 14.3   < >  12.7* 12.5* 13.1 12.5* 12.1* 11.3*  HCT 43.8   < > 39.0 38.6* 41.0 39.0 36.1* 34.3*  MCV 94.8  --  95.1  --   --  96.1 93.3 94.2  PLT 154  --  137*  --   --  156 139* 145*   < > = values in this interval not displayed.   Basic Metabolic Panel: Recent Labs  Lab 04/13/23 0802 04/14/23 0051 04/15/23 0552 04/16/23 0534 04/17/23 0447  NA 139 138 137 139 143  K 3.7 3.8 4.0 3.8 3.6  CL 108 105 105 106 106  CO2 22 23 22 24 24   GLUCOSE 147* 109*  116* 107* 113*  BUN 31* 27* 26* 24* 21  CREATININE 1.48* 1.29* 1.51* 1.51* 1.47*  CALCIUM 9.2 8.6* 8.9 9.1 8.4*  MG  --   --  2.0  --   --    GFR: Estimated Creatinine Clearance: 38.8 mL/min (A) (by C-G formula based on SCr of 1.47 mg/dL (H)). Liver Function Tests: Recent Labs  Lab 04/13/23 0802  AST 23  ALT 18  ALKPHOS 51  BILITOT 0.7  PROT 6.7  ALBUMIN 4.0   No results for input(s): "LIPASE", "AMYLASE" in the last 168 hours. No results for input(s): "AMMONIA" in the last 168 hours. Coagulation Profile: Recent Labs  Lab 04/13/23 0802 04/14/23 0051 04/15/23 0552 04/16/23 0534  INR 2.8* 2.8* 2.6* 1.3*   Cardiac Enzymes: No results for input(s): "CKTOTAL", "CKMB", "CKMBINDEX", "TROPONINI" in the last 168 hours. BNP (last 3 results) No results for input(s): "PROBNP" in the last 8760 hours. HbA1C: No results for input(s): "HGBA1C" in the last 72 hours. CBG: Recent Labs  Lab 04/16/23 1237 04/16/23 1632 04/16/23 2057 04/17/23 0746 04/17/23 1135  GLUCAP 139* 111* 133* 142* 125*   Lipid Profile: No results for input(s): "CHOL", "HDL", "LDLCALC", "TRIG", "CHOLHDL", "LDLDIRECT" in the last 72 hours. Thyroid Function Tests: No results for input(s): "TSH", "T4TOTAL", "FREET4", "T3FREE", "THYROIDAB" in the last 72 hours. Anemia Panel: No results for input(s): "VITAMINB12", "FOLATE", "FERRITIN", "TIBC", "IRON", "RETICCTPCT" in the last 72 hours. Sepsis Labs: No results for input(s): "PROCALCITON", "LATICACIDVEN" in the last 168 hours.  No results found for this or any previous visit (from the past 240 hours).   Radiology Studies: No results found.  Scheduled Meds:  folic acid  500 mcg Oral Daily   insulin aspart  0-15 Units Subcutaneous TID WC   insulin aspart  0-5 Units Subcutaneous QHS   pantoprazole (PROTONIX) IV  40 mg Intravenous Q12H   simvastatin  20 mg Oral QHS   tamsulosin  0.4 mg Oral QHS   vitamin B-12  100 mcg Oral Daily   Continuous Infusions:   sodium chloride 10 mL/hr at 04/17/23 0547     LOS: 3 days    Time spent: 50 MINS    Willeen Niece, MD [facility]   If 7PM-7AM, please contact night-coverage

## 2023-04-17 NOTE — Progress Notes (Signed)
   04/17/23 0918  Readmission Prevention Plan - Moderate Risk  Transportation Screening Complete  Home Care Screening Complete  Moderate Risk Medication Review Complete

## 2023-04-17 NOTE — Transfer of Care (Signed)
 Immediate Anesthesia Transfer of Care Note  Patient: Joseph Malone  Procedure(s) Performed: COLONOSCOPY POLYPECTOMY  Patient Location: PACU  Anesthesia Type:MAC  Level of Consciousness: drowsy  Airway & Oxygen Therapy: Patient Spontanous Breathing and Patient connected to face mask oxygen  Post-op Assessment: Report given to RN, Post -op Vital signs reviewed and stable, and Patient moving all extremities X 4  Post vital signs: Reviewed and stable  Last Vitals:  Vitals Value Taken Time  BP 122/53   Temp    Pulse 75   Resp 12   SpO2 100     Last Pain:  Vitals:   04/17/23 1318  TempSrc: Temporal  PainSc: 0-No pain      Patients Stated Pain Goal: 0 (04/14/23 1037)  Complications: No notable events documented.

## 2023-04-17 NOTE — Interval H&P Note (Signed)
 History and Physical Interval Note:  04/17/2023 2:56 PM  Joseph Malone  has presented today for surgery, with the diagnosis of hematochezia.  The various methods of treatment have been discussed with the patient and family. After consideration of risks, benefits and other options for treatment, the patient has consented to  Procedure(s): COLONOSCOPY (N/A) POLYPECTOMY as a surgical intervention.  The patient's history has been reviewed, patient examined, no change in status, stable for surgery.  I have reviewed the patient's chart and labs.  Questions were answered to the patient's satisfaction.     [name]

## 2023-04-18 DIAGNOSIS — K922 Gastrointestinal hemorrhage, unspecified: Secondary | ICD-10-CM | POA: Diagnosis not present

## 2023-04-18 LAB — BASIC METABOLIC PANEL
Anion gap: 9 (ref 5–15)
BUN: 25 mg/dL — ABNORMAL HIGH (ref 8–23)
CO2: 24 mmol/L (ref 22–32)
Calcium: 8.9 mg/dL (ref 8.9–10.3)
Chloride: 108 mmol/L (ref 98–111)
Creatinine, Ser: 1.43 mg/dL — ABNORMAL HIGH (ref 0.61–1.24)
GFR, Estimated: 46 mL/min — ABNORMAL LOW (ref >60)
Glucose, Bld: 109 mg/dL — ABNORMAL HIGH (ref 70–99)
Potassium: 3.4 mmol/L — ABNORMAL LOW (ref 3.5–5.1)
Sodium: 141 mmol/L (ref 135–145)

## 2023-04-18 LAB — MAGNESIUM: Magnesium: 2 mg/dL (ref 1.7–2.4)

## 2023-04-18 LAB — CBC
HCT: 36.1 % — ABNORMAL LOW (ref 39.0–52.0)
Hemoglobin: 11.9 g/dL — ABNORMAL LOW (ref 13.0–17.0)
MCH: 31.4 pg (ref 26.0–34.0)
MCHC: 33 g/dL (ref 30.0–36.0)
MCV: 95.3 fL (ref 80.0–100.0)
Platelets: 158 10*3/uL (ref 150–400)
RBC: 3.79 MIL/uL — ABNORMAL LOW (ref 4.22–5.81)
RDW: 12.2 % (ref 11.5–15.5)
WBC: 8.2 10*3/uL (ref 4.0–10.5)
nRBC: 0 % (ref 0.0–0.2)

## 2023-04-18 LAB — GLUCOSE, CAPILLARY: Glucose-Capillary: 96 mg/dL (ref 70–99)

## 2023-04-18 LAB — PHOSPHORUS: Phosphorus: 3.5 mg/dL (ref 2.5–4.6)

## 2023-04-18 NOTE — Discharge Instructions (Signed)
 Advised to follow-up with primary care physician in 1 week. Advised to follow-up in Coumadin clinic as scheduled. Patient has self-limited diverticular bleed. Advised to resume Coumadin as directed prior to admission.

## 2023-04-18 NOTE — Discharge Summary (Signed)
 Physician Discharge Summary  Joseph Malone ZOX:[id number] DOB: [birth year] DOA: 04/13/2023  PCP: Shelva Majestic, MD  Admit date: 04/13/2023  Discharge date: 04/18/2023  Admitted From: Home  Disposition:  Home  Recommendations for Outpatient Follow-up:  Follow up with PCP in 1-2 weeks. Please obtain BMP/CBC in one week Advised to follow-up in Coumadin clinic as scheduled. Patient has self-limited diverticular bleed. Advised to resume Coumadin as directed prior to admission.  Home Health:None Equipment/Devices:None  Discharge Condition: Stable CODE STATUS:Full code Diet recommendation: Heart Healthy   Brief [facility] Course: This 89 year old Male with PMH significant for BPH, CAD status post CABG, CHF with reduced EF with ICD in place, LV thrombus on Coumadin, HTN, HLD, DM2, CKD 3B comes to the hospital with complaints of bright red blood per rectum with some dark stools. INR therapeutic on Coumadin.  Patient was admitted for further evaluation. GI was consulted. Coumadin was kept on hold.INR is significantly down trended.  Patient underwent successful colonoscopy, found to have 2 polyps which were removed,  pathology report is hyperplastic. Patient has self-limiting diverticular bleed.  Patient feels much better,  wants to be discharged.GI signed off , recommended patient can resume Coumadin from today.  Patient will follow-up with the Coumadin clinic.  Patient being discharged home.  Discharge Diagnoses:  Principal Problem:   GI bleed Active Problems:   Hematochezia   Adenomatous polyp of sigmoid colon   Diverticulosis of colon with hemorrhage   Polyp of cecum  Bright red blood per rectum: Patient presented with bright red blood /rectum. Unclear source at this time. Coumadin remains on hold.   INR has trended down 1.3.  He underwent colonoscopy, which shows 2 polyps which were removed and hyperplastic.Marland Kitchen H&H remains stable.  Patient has self-limiting diverticular  bleed GI signed off.  No GI follow-up needed.   LV thrombus: On Coumadin with therapeutic INR.  Currently on hold. Patient can resume Coumadin from today.  Patient is being discharged home.   CKD stage II: Creatinine stable at 1.2. Avoid nephrotoxic medications.   Diabetes mellitus type 2 Sliding scale and Accu-Chek   Congestive heart failure with reduced EF, EF 30% Status post [employer] ICD CAD status post CABG in 2001 Continue aspirin, Aldactone      GERD: Continue pantoprazole 40 mg daily   Hyperlipidemia Continue Zocor 20 mg daily.   BPH: Continue Flomax 0.4 mg daily  Discharge Instructions  Discharge Instructions     Call MD for:  difficulty breathing, headache or visual disturbances   Complete by: As directed    Call MD for:  persistant dizziness or light-headedness   Complete by: As directed    Call MD for:  persistant nausea and vomiting   Complete by: As directed    Diet - low sodium heart healthy   Complete by: As directed    Diet Carb Modified   Complete by: As directed    Discharge instructions   Complete by: As directed    Advised to follow-up with primary care physician in 1 week. Advised to follow-up in Coumadin clinic as scheduled. Patient has self-limited diverticular bleed. Advised to resume Coumadin as directed prior to admission.   Increase activity slowly   Complete by: As directed       Allergies as of 04/18/2023       Reactions   Penicillins Rash, Other (See Comments)        Medication List     TAKE these medications    acetaminophen 500  MG tablet Commonly known as: TYLENOL Take 500-1,000 mg by mouth at bedtime.   aspirin 81 MG tablet Take 1 tablet (81 mg total) by mouth every evening.   folic acid 400 MCG tablet Commonly known as: FOLVITE Take 400 mcg by mouth daily.   Glucosamine HCl 1500 MG Tabs Take 1,500 mg by mouth every evening.   Melatonin 5 MG Chew Chew 1 tablet by mouth at bedtime.   metFORMIN 500 MG 24  hr tablet Commonly known as: GLUCOPHAGE-XR TAKE 1 TABLET(500 MG) BY MOUTH DAILY WITH BREAKFAST   multivitamin with minerals Tabs tablet Take 1 tablet by mouth at bedtime.   nitroGLYCERIN 0.4 MG SL tablet Commonly known as: NITROSTAT Place 1 tablet (0.4 mg total) under the tongue every 5 (five) minutes as needed for chest pain. 1 tablet every 5 minutes for 3 dose only   omeprazole 40 MG capsule Commonly known as: PRILOSEC Take 1 capsule (40 mg total) by mouth daily.   simvastatin 40 MG tablet Commonly known as: ZOCOR Take 0.5 tablets (20 mg total) by mouth at bedtime.   spironolactone 25 MG tablet Commonly known as: ALDACTONE TAKE 1/2 TABLET BY MOUTH DAILY What changed:  when to take this reasons to take this   tamsulosin 0.4 MG Caps capsule Commonly known as: FLOMAX Take 0.4 mg by mouth at bedtime.   vitamin B-12 100 MCG tablet Commonly known as: CYANOCOBALAMIN Take 100 mcg by mouth daily.   warfarin 5 MG tablet Commonly known as: COUMADIN Take as directed. If you are unsure how to take this medication, talk to your nurse or doctor. Original instructions: TAKE 1 TABLET BY MOUTH DAILY EXCEPT TAKE 1/2 TABLET ON MONDAY OR AS DIRECTED BY ANTICOAGULATION CLINIC        Follow-up Information     Shelva Majestic, MD Follow up in 1 week(s).   Specialty: Family Medicine Contact information: [address] [place] [address] [phone]                Allergies  Allergen Reactions   Penicillins Rash and Other (See Comments)    Consultations: Gastroenterology   Procedures/Studies: Colonoscopy    Subjective: Patient was seen and examined at bedside.  Overnight events noted.   Patient reports doing much better,  wants to be discharged.   Patient is going to start Coumadin from today and follow-up in Coumadin clinic in 2 days.  Discharge Exam: Vitals:   04/17/23 2156 04/18/23 0600  BP: 125/72 (!) 113/99  Pulse: 84 86  Resp:  17  Temp:  98.2 F (36.8 C) 98.1 F (36.7 C)  SpO2: 94% 91%   Vitals:   04/17/23 1520 04/17/23 1530 04/17/23 2156 04/18/23 0600  BP: (!) 129/54 (!) 149/59 125/72 (!) 113/99  Pulse: 75 74 84 86  Resp: 17 19  17   Temp:   98.2 F (36.8 C) 98.1 F (36.7 C)  TempSrc:   Oral Oral  SpO2: 97% 98% 94% 91%  Weight:      Height:        General: Pt is alert, awake, not in acute distress Cardiovascular: RRR, S1/S2 +, no rubs, no gallops Respiratory: CTA bilaterally, no wheezing, no rhonchi Abdominal: Soft, NT, ND, bowel sounds + Extremities: no edema, no cyanosis    The results of significant diagnostics from this hospitalization (including imaging, microbiology, ancillary and laboratory) are listed below for reference.     Microbiology: No results found for this or any previous visit (from the past  240 hours).   Labs: BNP (last 3 results) Recent Labs    07/11/22 0941 07/24/22 1501  BNP 201.7* 79.8   Basic Metabolic Panel: Recent Labs  Lab 04/14/23 0051 04/15/23 0552 04/16/23 0534 04/17/23 0447 04/18/23 0546  NA 138 137 139 143 141  K 3.8 4.0 3.8 3.6 3.4*  CL 105 105 106 106 108  CO2 23 22 24 24 24   GLUCOSE 109* 116* 107* 113* 109*  BUN 27* 26* 24* 21 25*  CREATININE 1.29* 1.51* 1.51* 1.47* 1.43*  CALCIUM 8.6* 8.9 9.1 8.4* 8.9  MG  --  2.0  --   --  2.0  PHOS  --   --   --   --  3.5   Liver Function Tests: Recent Labs  Lab 04/13/23 0802  AST 23  ALT 18  ALKPHOS 51  BILITOT 0.7  PROT 6.7  ALBUMIN 4.0   No results for input(s): "LIPASE", "AMYLASE" in the last 168 hours. No results for input(s): "AMMONIA" in the last 168 hours. CBC: Recent Labs  Lab 04/14/23 0051 04/14/23 1034 04/14/23 1612 04/15/23 0552 04/16/23 0819 04/17/23 0447 04/18/23 0546  WBC 7.4  --   --  8.1 7.8 7.2 8.2  HGB 12.7*   < > 13.1 12.5* 12.1* 11.3* 11.9*  HCT 39.0   < > 41.0 39.0 36.1* 34.3* 36.1*  MCV 95.1  --   --  96.1 93.3 94.2 95.3  PLT 137*  --   --  156 139* 145* 158   < > =  values in this interval not displayed.   Cardiac Enzymes: No results for input(s): "CKTOTAL", "CKMB", "CKMBINDEX", "TROPONINI" in the last 168 hours. BNP: Invalid input(s): "POCBNP" CBG: Recent Labs  Lab 04/17/23 0746 04/17/23 1135 04/17/23 1807 04/17/23 2157 04/18/23 0736  GLUCAP 142* 125* 193* 110* 96   D-Dimer No results for input(s): "DDIMER" in the last 72 hours. Hgb A1c No results for input(s): "HGBA1C" in the last 72 hours. Lipid Profile No results for input(s): "CHOL", "HDL", "LDLCALC", "TRIG", "CHOLHDL", "LDLDIRECT" in the last 72 hours. Thyroid function studies No results for input(s): "TSH", "T4TOTAL", "T3FREE", "THYROIDAB" in the last 72 hours.  Invalid input(s): "FREET3" Anemia work up No results for input(s): "VITAMINB12", "FOLATE", "FERRITIN", "TIBC", "IRON", "RETICCTPCT" in the last 72 hours. Urinalysis    Component Value Date/Time   COLORURINE YELLOW 12/14/2022 2047   APPEARANCEUR CLEAR 12/14/2022 2047   LABSPEC 1.021 12/14/2022 2047   PHURINE 5.0 12/14/2022 2047   GLUCOSEU NEGATIVE 12/14/2022 2047   HGBUR NEGATIVE 12/14/2022 2047   BILIRUBINUR negative 01/02/2023 1806   BILIRUBINUR neg 05/17/2020 1017   KETONESUR negative 01/02/2023 1806   KETONESUR NEGATIVE 12/14/2022 2047   PROTEINUR =30 (A) 01/02/2023 1806   PROTEINUR NEGATIVE 12/14/2022 2047   UROBILINOGEN 0.2 01/02/2023 1806   UROBILINOGEN 0.2 05/06/2012 1726   NITRITE Negative 01/02/2023 1806   NITRITE NEGATIVE 12/14/2022 2047   LEUKOCYTESUR Negative 01/02/2023 1806   LEUKOCYTESUR NEGATIVE 12/14/2022 2047   Sepsis Labs Recent Labs  Lab 04/15/23 0552 04/16/23 0819 04/17/23 0447 04/18/23 0546  WBC 8.1 7.8 7.2 8.2   Microbiology No results found for this or any previous visit (from the past 240 hours).   Time coordinating discharge: Over 30 minutes  SIGNED:   Willeen Niece, MD  [facility] 04/18/2023, 11:52 AM Pager   If 7PM-7AM, please contact night-coverage

## 2023-04-18 NOTE — Plan of Care (Signed)

## 2023-04-18 NOTE — Anesthesia Postprocedure Evaluation (Signed)
 Anesthesia Post Note  Patient: Joseph Malone  Procedure(s) Performed: COLONOSCOPY POLYPECTOMY     Patient location during evaluation: PACU Anesthesia Type: MAC Level of consciousness: awake and alert Pain management: pain level controlled Vital Signs Assessment: post-procedure vital signs reviewed and stable Respiratory status: spontaneous breathing, nonlabored ventilation and respiratory function stable Cardiovascular status: blood pressure returned to baseline and stable Postop Assessment: no apparent nausea or vomiting Anesthetic complications: no   No notable events documented.  Last Vitals:  Vitals:   04/17/23 2156 04/18/23 0600  BP: 125/72 (!) 113/99  Pulse: 84 86  Resp:  17  Temp: 36.8 C 36.7 C  SpO2: 94% 91%    Last Pain:  Vitals:   04/18/23 0600  TempSrc: Oral  PainSc:    Pain Goal: Patients Stated Pain Goal: 0 (04/14/23 1037)                 Lannie Fields

## 2023-04-18 NOTE — Progress Notes (Signed)
 AVS reviewed w/ pt  who verbalized an understanding. Pt dressing for d/c to home- PIV removed as noted. Pt calling daughter to pick up at hospital

## 2023-04-19 ENCOUNTER — Encounter (HOSPITAL_COMMUNITY): Payer: Self-pay | Admitting: Internal Medicine

## 2023-04-20 ENCOUNTER — Telehealth: Payer: Self-pay | Admitting: *Deleted

## 2023-04-20 ENCOUNTER — Telehealth: Payer: Self-pay | Admitting: Family Medicine

## 2023-04-20 ENCOUNTER — Other Ambulatory Visit (HOSPITAL_COMMUNITY): Payer: Self-pay | Admitting: Cardiology

## 2023-04-20 ENCOUNTER — Encounter: Payer: Self-pay | Admitting: *Deleted

## 2023-04-20 DIAGNOSIS — E785 Hyperlipidemia, unspecified: Secondary | ICD-10-CM

## 2023-04-20 LAB — SURGICAL PATHOLOGY

## 2023-04-20 MED ORDER — SIMVASTATIN 40 MG PO TABS: 20.0000 mg | ORAL_TABLET | Freq: Every day | ORAL | 2 refills | Status: DC

## 2023-04-20 NOTE — Telephone Encounter (Signed)
 I agree with Joseph Malone.  GI said he could restart the medication so I am okay with that with the restart on Coumadin as long as we have close monitoring

## 2023-04-20 NOTE — Transitions of Care (Post Inpatient/ED Visit) (Signed)
 04/20/2023  Name: Joseph Malone MRN: [id number] DOB: [birth year]  Today's TOC FU Call Status: Today's TOC FU Call Status:: Successful TOC FU Call Completed TOC FU Call Complete Date: 04/20/23 Patient's Name and Date of Birth confirmed.  Transition Care Management Follow-up Telephone Call Date of Discharge: 04/18/23 Discharge Facility: [facility]) Type of Discharge: Inpatient Admission Primary Inpatient Discharge Diagnosis:: GI bleed How have you been since you were released from the hospital?: Better (eating and drinking well, ambulating without difficulty, denies any signs/ symptoms GI bleeding) Any questions or concerns?: No  Items Reviewed: Did you receive and understand the discharge instructions provided?: Yes Medications obtained,verified, and reconciled?: Yes (Medications Reviewed) Any new allergies since your discharge?: No Dietary orders reviewed?: Yes Type of Diet Ordered:: low sodium,  heart healthy,  carb modified Do you have support at home?: Yes People in Home: alone Name of Support/Comfort Primary Source: adult daughter lives nearby and helps as needed Patient declined enrollment in [facility] 30 day program Reviewed signs/ symptoms bleeding (bruising, bleeding gums, urine or stool) reportable signs/ symptoms Reviewed all upcoming scheduled appointments including having INR checked on 04/22/23 @ 930 am   Medications Reviewed Today: Medications Reviewed Today     Reviewed by Audrie Gallus, RN (Registered Nurse) on 04/20/23 at 1439  Med List Status: <None>   Medication Order Taking? Sig Documenting Provider Last Dose Status Informant  acetaminophen (TYLENOL) 500 MG tablet [id number] Yes Take 500-1,000 mg by mouth at bedtime. [provider] Taking Active Self, Pharmacy Records  aspirin 81 MG tablet [id number] Yes Take 1 tablet (81 mg total) by mouth every evening. Hughie Closs, MD Taking Active Self, Pharmacy Records  folic acid (FOLVITE) 400 MCG tablet  [id number] Yes Take 400 mcg by mouth daily. [provider] Taking Active Self, Pharmacy Records  Glucosamine HCl 1500 MG TABS [id number] Yes Take 1,500 mg by mouth every evening.  [provider] Taking Active Self, Pharmacy Records  Melatonin 5 MG CHEW [id number] Yes Chew 1 tablet by mouth at bedtime. [provider] Taking Active Self, Pharmacy Records  metFORMIN (GLUCOPHAGE-XR) 500 MG 24 hr tablet [id number] Yes TAKE 1 TABLET(500 MG) BY MOUTH DAILY WITH BREAKFAST Shelva Majestic, MD Taking Active Self, Pharmacy Records  Multiple Vitamin (MULTIVITAMIN WITH MINERALS) TABS [id number] Yes Take 1 tablet by mouth at bedtime. [provider] Taking Active Self, Pharmacy Records  nitroGLYCERIN (NITROSTAT) 0.4 MG SL tablet [id number] Yes Place 1 tablet (0.4 mg total) under the tongue every 5 (five) minutes as needed for chest pain. 1 tablet every 5 minutes for 3 dose only [place], Anderson Malta, FNP Taking Active Self, Pharmacy Records           Med Note Sedonia Small Apr 13, 2023 10:22 AM) >30 days.   omeprazole (PRILOSEC) 40 MG capsule [id number] Yes Take 1 capsule (40 mg total) by mouth daily. Shelva Majestic, MD Taking Active Self, Pharmacy Records  simvastatin (ZOCOR) 40 MG tablet [id number] Yes Take 0.5 tablets (20 mg total) by mouth at bedtime. Bensimhon, Bevelyn Buckles, MD Taking Active Self, Pharmacy Records  spironolactone (ALDACTONE) 25 MG tablet [id number] Yes TAKE 1/2 TABLET BY MOUTH DAILY  Patient taking differently: Take 12.5 mg by mouth daily as needed (Weight gain/Swelling).   Shelva Majestic, MD Taking Active Self, Pharmacy Records  tamsulosin [facility]) 0.4 MG CAPS capsule [id number] Yes Take 0.4 mg by mouth at bedtime. [provider] Taking Active Self, Pharmacy Records  vitamin B-12 (  CYANOCOBALAMIN) 100 MCG tablet [id number] Yes Take 100 mcg by mouth daily. [provider] Taking Active Self, Pharmacy Records  warfarin (COUMADIN) 5 MG  tablet [id number] Yes TAKE 1 TABLET BY MOUTH DAILY EXCEPT TAKE 1/2 TABLET ON MONDAY OR AS DIRECTED BY ANTICOAGULATION CLINIC Hunter, Aldine Contes, MD Taking Active Self, Pharmacy Records           Med Note Sedonia Small Apr 13, 2023 10:24 AM) Confirmed directions with patient.             Home Care and Equipment/Supplies: Were Home Health Services Ordered?: No Any new equipment or medical supplies ordered?: No  Functional Questionnaire: Do you need assistance with bathing/showering or dressing?: No Do you need assistance with meal preparation?: No Do you need assistance with eating?: No Do you have difficulty maintaining continence: No Do you need assistance with getting out of bed/getting out of a chair/moving?: No Do you have difficulty managing or taking your medications?:  (pt prefills his medication box)  Follow up appointments reviewed: PCP Follow-up appointment confirmed?: Yes Date of PCP follow-up appointment?: 04/28/23 Follow-up Provider: Dr. Tana Conch  @ 820 am Specialist Hospital Follow-up appointment confirmed?: No Reason Specialist Follow-Up Not Confirmed: Patient has Specialist Provider Number and will Call for Appointment Do you need transportation to your follow-up appointment?: No Do you understand care options if your condition(s) worsen?: Yes-patient verbalized understanding  SDOH Interventions Today    Flowsheet Row Most Recent Value  SDOH Interventions   Food Insecurity Interventions Intervention Not Indicated  Housing Interventions Intervention Not Indicated  Transportation Interventions Intervention Not Indicated  Utilities Interventions Intervention Not Indicated       Irving Shows [facility], BSN RN Care Manager/ Transition of Care [place] [phone]

## 2023-04-20 NOTE — Telephone Encounter (Signed)
 Pt returned call and reports he does know his apt PCP is next week and he misspoke about that apt.  Pt also reported he had a "high sugar number earlier" and wanted to know if he should take more medication.  He reports glucose of 189. He reports he did not take metformin this morning as is directed. He said he got up late and didn't eat until about noon and just has not taken it yet. Advised pt to take metformin now, about 2:30 pm, and keep a log of glucose readings and bring to apt with PCP. Advised if he has another reading over 160 tomorrow to contact PCP office. Pt reports he does feel ok but is tired due to difficulty sleeping in the hospital. Pt verbalized understanding.

## 2023-04-20 NOTE — Telephone Encounter (Signed)
 Excellent plan-thanks Carollee Herter

## 2023-04-20 NOTE — Telephone Encounter (Signed)
 See below

## 2023-04-20 NOTE — Telephone Encounter (Signed)
 Pt hospitalized for rectal bleeding. Daughter concerned what to do about Coumadin. Please advise.   Patient Name First: Joseph Last: Malone Gender: Male DOB: [birth year] Age: 89 Y 2 M 7 D Return Phone Number: [phone] (Primary) Address: City/ State/ Zip: [place]  [address] Client [facility] - Human resources officer [facility] [employer] Provider Tana Conch- MD Contact Type Call Who Is Calling Patient / Member / Family / Caregiver Call Type Triage / Clinical Caller Name [name] Relationship To Patient Daughter Return Phone Number [phone] (Primary) Chief Complaint Medication Question (non symptomatic) Reason for Call Request to Speak to a Physician Initial Comment Caller states that her father has been in the hospital all week, because he had rectal bleeding. They cannot determine what to do about his Coumadin. She would like to speak to the on call. He does not have any current symptoms. Translation No Disp. Time [name] Time) Disposition Final User 04/18/2023 10:14:45 AM Send To Nurse Laurence Slate, RN, Rhonda 04/18/2023 3:35:04 PM FINAL ATTEMPT MADE - message left Yes Maisie Fus RN, Cheri 04/18/2023 3:35:09 PM Send to RN Final Attempt Jaquita Folds, RN, Cheri Final Disposition 04/18/2023 3:35:04 PM FINAL ATTEMPT MADE - message left

## 2023-04-20 NOTE — Telephone Encounter (Signed)
 Pt called to report he was in the hospital for GI bleed and was d/c on 3/8. He reports he restarted warfarin that evening and would like to check INR. Advised pt he will need to complete a new ROI for his daughter to receive any of his health care information. Advised his wife is currently the only person on his ROI.  Pt verbalized understanding and reported he did not know his daughter contacted the office. He will let her know he contacted the office. Scheduled pt for 3/12 at [facility]. Pt reported he also had an apt with PCP on that day.   Review of chart revealed pt's PCP apt is on 3/18. LVM to call back to verify PCP and coumadin clinic apt.

## 2023-04-22 ENCOUNTER — Ambulatory Visit (INDEPENDENT_AMBULATORY_CARE_PROVIDER_SITE_OTHER)

## 2023-04-22 DIAGNOSIS — Z7901 Long term (current) use of anticoagulants: Secondary | ICD-10-CM

## 2023-04-22 LAB — POCT INR: INR: 1.6 — AB (ref 2.0–3.0)

## 2023-04-22 NOTE — Patient Instructions (Addendum)
 Pre visit review using our clinic review tool, if applicable. No additional management support is needed unless otherwise documented below in the visit note.  Increase dose today to take 1 1/2 tablets and then continue 1 tablet daily except 1/2 tablet on Mondays. Recheck in 2 weeks.

## 2023-04-22 NOTE — Progress Notes (Signed)
 I have reviewed and agree with note, evaluation, plan.   Tana Conch, MD

## 2023-04-22 NOTE — Progress Notes (Addendum)
 Pt was in hospital for GI bleed and was d/c on 3/8. Warfarin was restarted on 3/8. Due to recent GI bleed and pt only back on warfarin for the last 4 days will only increase one dose of warfarin. Pt has been very stable of the current dose. No weekly dose change was made, only a booster dose for today. Will follow INR closer, per PCP, due to GI bleed. Increase dose today to take 1 1/2 tablets and then continue 1 tablet daily except 1/2 tablet on Mondays. Recheck in 2 weeks.

## 2023-04-28 ENCOUNTER — Telehealth: Payer: Self-pay | Admitting: Family Medicine

## 2023-04-28 ENCOUNTER — Encounter: Payer: Self-pay | Admitting: Family Medicine

## 2023-04-28 ENCOUNTER — Ambulatory Visit (INDEPENDENT_AMBULATORY_CARE_PROVIDER_SITE_OTHER): Admitting: Family Medicine

## 2023-04-28 VITALS — BP 120/80 | HR 87 | Temp 97.0°F | Ht 71.0 in | Wt 217.2 lb

## 2023-04-28 DIAGNOSIS — E1169 Type 2 diabetes mellitus with other specified complication: Secondary | ICD-10-CM

## 2023-04-28 DIAGNOSIS — K921 Melena: Secondary | ICD-10-CM | POA: Diagnosis not present

## 2023-04-28 DIAGNOSIS — I2510 Atherosclerotic heart disease of native coronary artery without angina pectoris: Secondary | ICD-10-CM

## 2023-04-28 DIAGNOSIS — I5022 Chronic systolic (congestive) heart failure: Secondary | ICD-10-CM

## 2023-04-28 DIAGNOSIS — Z7984 Long term (current) use of oral hypoglycemic drugs: Secondary | ICD-10-CM

## 2023-04-28 DIAGNOSIS — I10 Essential (primary) hypertension: Secondary | ICD-10-CM | POA: Diagnosis not present

## 2023-04-28 DIAGNOSIS — E785 Hyperlipidemia, unspecified: Secondary | ICD-10-CM

## 2023-04-28 LAB — COMPREHENSIVE METABOLIC PANEL
ALT: 13 U/L (ref 0–53)
AST: 16 U/L (ref 0–37)
Albumin: 4 g/dL (ref 3.5–5.2)
Alkaline Phosphatase: 55 U/L (ref 39–117)
BUN: 24 mg/dL — ABNORMAL HIGH (ref 6–23)
CO2: 26 meq/L (ref 19–32)
Calcium: 9.1 mg/dL (ref 8.4–10.5)
Chloride: 108 meq/L (ref 96–112)
Creatinine, Ser: 1.25 mg/dL (ref 0.40–1.50)
GFR: 50.45 mL/min — ABNORMAL LOW (ref >60.00)
Glucose, Bld: 116 mg/dL — ABNORMAL HIGH (ref 70–99)
Potassium: 3.7 meq/L (ref 3.5–5.1)
Sodium: 142 meq/L (ref 135–145)
Total Bilirubin: 0.3 mg/dL (ref 0.2–1.2)
Total Protein: 6.3 g/dL (ref 6.0–8.3)

## 2023-04-28 LAB — CBC WITH DIFFERENTIAL/PLATELET
Basophils Absolute: 0.1 10*3/uL (ref 0.0–0.1)
Basophils Relative: 1.2 % (ref 0.0–3.0)
Eosinophils Absolute: 0.2 10*3/uL (ref 0.0–0.7)
Eosinophils Relative: 3.5 % (ref 0.0–5.0)
HCT: 37.1 % — ABNORMAL LOW (ref 39.0–52.0)
Hemoglobin: 12.5 g/dL — ABNORMAL LOW (ref 13.0–17.0)
Lymphocytes Relative: 44.2 % (ref 12.0–46.0)
Lymphs Abs: 2.9 10*3/uL (ref 0.7–4.0)
MCHC: 33.7 g/dL (ref 30.0–36.0)
MCV: 92.9 fl (ref 78.0–100.0)
Monocytes Absolute: 0.5 10*3/uL (ref 0.1–1.0)
Monocytes Relative: 8.2 % (ref 3.0–12.0)
Neutro Abs: 2.8 10*3/uL (ref 1.4–7.7)
Neutrophils Relative %: 42.9 % — ABNORMAL LOW (ref 43.0–77.0)
Platelets: 165 10*3/uL (ref 150.0–400.0)
RBC: 3.99 Mil/uL — ABNORMAL LOW (ref 4.22–5.81)
RDW: 13.2 % (ref 11.5–15.5)
WBC: 6.5 10*3/uL (ref 4.0–10.5)

## 2023-04-28 MED ORDER — APIXABAN 2.5 MG PO TABS: 2.5000 mg | ORAL_TABLET | Freq: Two times a day (BID) | ORAL | 11 refills | Status: AC

## 2023-04-28 NOTE — Telephone Encounter (Signed)
-----   Message from Arvilla Meres sent at 04/28/2023  5:21 PM EDT ----- Regarding: RE: Coumadin I thought his Scr runs in the 1.4-1.5 range. No? ----- Message ----- From: Shelva Majestic, MD Sent: 04/28/2023   5:21 PM EDT To: Dolores Patty, MD Subject: RE: Coumadin                                   He said cost is not a concern. That sounds like a great alternate.   Are you thinking eliquis 2.5 mg twice daily (over 80 yes but creatinine <1.5 and weight not low)? If so I can send that in for him and have Carollee Herter with our coumadin clinic help him with adjustment ----- Message ----- From: Dolores Patty, MD Sent: 04/28/2023   8:38 AM EDT To: Shelva Majestic, MD Subject: RE: Coumadin                                   Jackelyn Hoehn. We can switch to low dose Eliquis and drop ASA. Can he afford the Eliquis? ----- Message ----- From: Shelva Majestic, MD Sent: 04/28/2023   8:35 AM EDT To: Dolores Patty, MD Subject: Coumadin                                       Patient does not mind staying on coumadin for apical thrombus but I just wanted your thoughts- he has had 2 hospitalizations in the last year for gastroenterology bleed (this time INR in normal range) - last one was upper GI now on pantoprazole and most recent due to diverticular bleed. Thankfully hemoglobin didn't drop too low. He is also on aspirin for CAD. He was to see you recently but was hospitalized so plans to call to reschedule and he is going of the chat with you about this as well when he comes in but just wanted to give you a heads up and get your perspective.   Thanks!  Tana Conch

## 2023-04-28 NOTE — Telephone Encounter (Signed)
 Dr. Gala Romney is correct that serum creatinine typically in the 1.4-1.5 range (and is more likely to be in that range but we can adjust dose later if needed) and patient over age 89 so we will use Eliquis 2.5 mg twice daily but we need to wait until INR trends down at least under 2  We will stop Coumadin today and patient already has a visit next week with Coumadin clinic-he will wait to start the Eliquis until he make sure INR looks better next week -he is going to continue the aspirin 81 mg for now but stop that once he starts eliquis  I am removing Coumadin and aspirin from medication list and I have instructed him of this but he knows to stay on aspirin until he starts eliquis

## 2023-04-28 NOTE — Progress Notes (Signed)
 Phone [phone] In person visit   Subjective:   Joseph Malone is a 89 y.o. year old very pleasant male patient who presents for/with See problem oriented charting Chief Complaint  Patient presents with   Hospitalization Follow-up    Pt is here for hosp f/u due to hematochezia   Past Medical History-  Patient Active Problem List   Diagnosis Date Noted   Multiple gastric ulcers 12/15/2022    Priority: High   GI bleeding 01/13/2022    Priority: High   Left ventricular thrombus 01/13/2022    Priority: High   Chronic systolic heart failure (HCC) 12/09/2010    Priority: High   Ischemic cardiomyopathy 10/02/2009    Priority: High   ICD (implantable cardioverter-defibrillator) in place 01/16/2009    Priority: High   Non-insulin dependent type 2 diabetes mellitus (HCC) 09/14/2008    Priority: High   CAD in native artery 09/25/2006    Priority: High   Esophageal stricture 12/15/2022    Priority: Medium    CKD stage 3b, GFR 30-44 ml/min (HCC) 12/05/2022    Priority: Medium    Atherosclerosis of aorta (HCC) 05/18/2020    Priority: Medium    Nephrolithiasis 03/24/2016    Priority: Medium    BPH (benign prostatic hyperplasia) 10/22/2007    Priority: Medium    Hyperlipidemia associated with type 2 diabetes mellitus (HCC) 04/22/2007    Priority: Medium    Essential hypertension 09/25/2006    Priority: Medium    COVID-19 virus infection 12/07/2022    Priority: Low   Polyp of ascending colon 01/16/2022    Priority: Low   PVC's (premature ventricular contractions) 01/24/2020    Priority: Low   Long term (current) use of anticoagulants 07/08/2017    Priority: Low   Erectile dysfunction 01/19/2014    Priority: Low   Malignant basal cell neoplasm of skin 09/29/2012    Priority: Low   Solitary pulmonary nodule 07/12/2012    Priority: Low   Osteoarthritis 09/25/2006    Priority: Low   Hematuria 09/25/2006    Priority: Low   Adenomatous polyp of sigmoid colon 04/17/2023    Diverticulosis of colon with hemorrhage 04/17/2023   Polyp of cecum 04/17/2023   GI bleed 04/14/2023   Hematochezia 04/13/2023   Duodenal ulcer 12/15/2022    Medications- reviewed and updated Current Outpatient Medications  Medication Sig Dispense Refill   acetaminophen (TYLENOL) 500 MG tablet Take 500-1,000 mg by mouth at bedtime.     aspirin 81 MG tablet Take 1 tablet (81 mg total) by mouth every evening.     folic acid (FOLVITE) 400 MCG tablet Take 400 mcg by mouth daily.     Glucosamine HCl 1500 MG TABS Take 1,500 mg by mouth every evening.      Melatonin 5 MG CHEW Chew 1 tablet by mouth at bedtime.     metFORMIN (GLUCOPHAGE-XR) 500 MG 24 hr tablet TAKE 1 TABLET(500 MG) BY MOUTH DAILY WITH BREAKFAST 90 tablet 3   Multiple Vitamin (MULTIVITAMIN WITH MINERALS) TABS Take 1 tablet by mouth at bedtime.     nitroGLYCERIN (NITROSTAT) 0.4 MG SL tablet Place 1 tablet (0.4 mg total) under the tongue every 5 (five) minutes as needed for chest pain. 1 tablet every 5 minutes for 3 dose only 90 tablet 3   omeprazole (PRILOSEC) 40 MG capsule Take 1 capsule (40 mg total) by mouth daily. 90 capsule 3   simvastatin (ZOCOR) 40 MG tablet Take 0.5 tablets (20 mg total) by mouth at  bedtime. 45 tablet 2   spironolactone (ALDACTONE) 25 MG tablet TAKE 1/2 TABLET BY MOUTH DAILY (Patient taking differently: Take 12.5 mg by mouth daily as needed (Weight gain/Swelling).) 90 tablet 3   tamsulosin (FLOMAX) 0.4 MG CAPS capsule Take 0.4 mg by mouth at bedtime.     vitamin B-12 (CYANOCOBALAMIN) 100 MCG tablet Take 100 mcg by mouth daily.     warfarin (COUMADIN) 5 MG tablet TAKE 1 TABLET BY MOUTH DAILY EXCEPT TAKE 1/2 TABLET ON MONDAY OR AS DIRECTED BY ANTICOAGULATION CLINIC 105 tablet 1   No current facility-administered medications for this visit.     Objective:  BP 120/80 Comment: most recent home reading  Pulse 87   Temp (!) 97 F (36.1 C)   Ht 5\' 11"  (1.803 m)   Wt 217 lb 3.2 oz (98.5 kg)   SpO2 97%   BMI  30.29 kg/m  Gen: NAD, resting comfortably CV: RRR no murmurs rubs or gallops Lungs: CTAB no crackles, wheeze, rhonchi Ext: trace edema Skin: warm, dry     Assessment and Plan    # Hospital follow-up for hematochezia S: Patient was hospitalized from 04/13/2023 to 04/18/2023 after presenting with bright red blood per rectum also with some dark stools.  INR was therapeutic on Coumadin.  GI was consulted.  Coumadin was held.  INR trended down- required vitamin K.  Patient with colonoscopy showing 2 polyps which were removed but thankfully pathology report showed only hyperplastic.  Believe was thought to be associated with self-limited diverticular bleed.  On discharge Coumadin was resumed and patient reported feeling much better.  Plan was for follow-up with our Coumadin clinic.  GI said no further follow-up with them because he also had small bowel endoscopy and flexible sigmoidoscopy reassuring before the colonoscopy on 04/17/23.  Patient is also on aspirin for CAD but this was continued.   -Patient has been working with the Coumadin clinic with INR most recently 6 days ago at 1.6 up from 1.3 with plan for 2-week repeat.  -Patient with prior melena history with history of gastric erosions requiring cauterization back in 2020 with recurrence in 2024 and placed on omeprazole 40 mg indefinitely  Today patient reports had fatigue but is improving. No blood in the stool or melena since leaving hospital. No chest pain or shortness of breath   A/P: hospital follow up for gastroenterology bleed- he seems to be improving strength wise- check labs today to make sure anemia stable or improving- if this is he can start back at the [facility] to continue to work on regaining strength.  - has INR check next Wednesday and we will hold off on recheck today  - I am also reaching out to Dr. Gala Romney- patient plans to reschedule next visit- we are going to see if he thinks possible to come off coumadin with prior apical  thrombus  - also stay on omeprazole with prior upper gastroenterology bleed but no evidence of that on this most recent hospitalzation.    #Heart failure with reduced ejection fraction/ ischemic cardiomypopathy- Dr. Gala Romney #hypertension S: Medication:Lasix 40 mg as needed (takes potassium  with Lasix)- hasn't needed in some time,  spironolactone 12.5 mg  at night -losartan 25 mg daily off isnce September with low bp -EF 07/25/22 of 30-35%   stable  Edema: at least trace edema Weight gain:none- fluctuating around 210- as high as 216 but most recently at home 211 Shortness of breath: none now- did have some when got home from hospital  BP Readings from Last 3 Encounters:  04/28/23 120/80  04/18/23 (!) 113/99  02/17/23 130/72    A/P:  CHF - appears stable- continue current medications - he had been doing spironolactone mainly as needed but may do daily for blood pressure stabilty- some home BPs higher such as 130s Hypertension - stable- continue current medicines - though will be more consistent on spironolactone  #CAD with defibrillator in place/hyperlipidemia #statin myalgia on higher doser #Apical thrombus-on Coumadin through cardiology clinic S: Medication:Aspirin 81 mg, simvastatin 40 mg (half of 40 mg)  Lab Results  Component Value Date   CHOL 139 10/16/2022   HDL 56.40 10/16/2022   LDLCALC 65 10/16/2022   LDLDIRECT 75.0 06/06/2022   TRIG 88.0 10/16/2022   CHOLHDL 2 10/16/2022   A/P: coronary artery disease asymptomatic - other than some shortness of breath when more anemic and just [facility] but improving- continue current medications  For apical thrombus- see discussion above but for now continue coumadin and follow up with coumadin clinic next week Lipids at goal with LDL under 70 continue current medications   # Diabetes S: Medication:metformin 500 mg XR daily  CBGs- Home readings have been between 120-150 fasting  -one day had sugar up to 189 but had cake  that day Lab Results  Component Value Date   HGBA1C 6.4 02/17/2023   HGBA1C 6.1 10/16/2022   HGBA1C 5.9 (A) 06/02/2022  A/P: diabetes well controlled - continue current medications. Too soon for a1c repeat right now   #BPH- on tamsulosin 0.4 mg and helpful . Actually better since leaving hospital  Recommended follow up: Return for next already scheduled visit or sooner if needed. Future Appointments  Date Time Provider Department Center  05/01/2023  3:35 PM [facility] DEVICE REMOTES [facility]  05/06/2023 10:00 AM [facility] COUMADIN CLIN[facility]  06/18/2023  3:00 PM Shelva Majestic, MD [facility]  07/31/2023  3:35 PM [facility] DEVICE REMOTES [facility]  08/27/2023  3:00 PM [facility] ANNUAL WELLNESS VISIT 1 [facility]    Lab/Order associations:   ICD-10-CM   1. Hematochezia  K92.1 Comprehensive metabolic panel    CBC with Differential/Platelet    2. CAD in native artery  I25.10 Comprehensive metabolic panel    CBC with Differential/Platelet    3. Chronic systolic heart failure (HCC)  W11.91     4. Essential hypertension  I10 Comprehensive metabolic panel    CBC with Differential/Platelet    5. Hyperlipidemia associated with type 2 diabetes mellitus (HCC)  E11.69    E78.5      No orders of the defined types were placed in this encounter.  Return precautions advised.  Tana Conch, MD

## 2023-04-28 NOTE — Patient Instructions (Addendum)
 hospital follow up for gastroenterology bleed- he seems to be improving strength wise- check labs today to make sure anemia stable or improving- if this is he can start back at the [facility] to continue to work on regaining strength.  - has INR check next Wednesday and we will hold off on recheck today   Please stop by lab before you go If you have mychart- we will send your results within 3 business days of [place] receiving them.  If you do not have mychart- we will call you about results within 5 business days of [place] receiving them.  *please also note that you will see labs on mychart as soon as they post. I will later go in and write notes on them- will say "notes from Dr. Durene Cal"   Recommended follow up: Return for next already scheduled visit or sooner if needed. Definitely contact us or seek care if recurrent blood in stool or dark black stool

## 2023-04-29 ENCOUNTER — Ambulatory Visit

## 2023-04-29 NOTE — Telephone Encounter (Signed)
 Will check INR on 3/26 and advise pt at that apt.

## 2023-04-30 ENCOUNTER — Ambulatory Visit

## 2023-05-06 ENCOUNTER — Telehealth: Payer: Self-pay

## 2023-05-06 ENCOUNTER — Ambulatory Visit (INDEPENDENT_AMBULATORY_CARE_PROVIDER_SITE_OTHER)

## 2023-05-06 DIAGNOSIS — Z7901 Long term (current) use of anticoagulants: Secondary | ICD-10-CM

## 2023-05-06 LAB — POCT INR: INR: 1.1 — AB (ref 2.0–3.0)

## 2023-05-06 NOTE — Progress Notes (Signed)
 Pt will transition to Eliquis. He was advised by PCP to stop warfarin oin 3/18. INR monitor today to assess if pt can start Eliquis, <2.0, and pt can start Eliquis. Educated pt concerning Eliquis. Advised to continue to watch for s/s of abnormal bruising or bleeding and if he has any s/s to go to ER or call 911. Pt will start Eliquis when he gets home this morning. Advised take Eliquis, one tablet BID. Pt verbalized understanding. Resolved anticoagulation encounters.

## 2023-05-06 NOTE — Telephone Encounter (Signed)
 Pt was in today for INR check before switching from warfarin to Eliquis.  Pt was advised to start Eliquis because INR was <2.0 but was not reminded he should also stop taking aspirin when starting Eliquis, per PCP note on 3/18.  Tried to contact pt to assure he has also stopped taking aspirin but had to LVM to return call.

## 2023-05-06 NOTE — Progress Notes (Signed)
 I have reviewed and agree with note, evaluation, plan.   Tana Conch, MD

## 2023-05-06 NOTE — Patient Instructions (Signed)
Pre visit review using our clinic review tool, if applicable. No additional management support is needed unless otherwise documented below in the visit note. 

## 2023-05-08 NOTE — Telephone Encounter (Signed)
 Pt reports he did stop taking the low dose aspirin.

## 2023-05-14 ENCOUNTER — Telehealth: Payer: Self-pay

## 2023-05-14 NOTE — Telephone Encounter (Signed)
 See below.  Copied from CRM [id number]. Topic: Clinical - Medication Question >> May 13, 2023 11:27 AM Melissa C wrote: Reason for CRM: while patient was hospitalized he was switched to apixaban (ELIQUIS) 2.5 MG TABS tablet from warfarin (COUMADIN) tablet 5 mg and he wants to make sure it is OK to [facility].

## 2023-05-14 NOTE — Telephone Encounter (Signed)
 Noted.

## 2023-05-14 NOTE — Telephone Encounter (Signed)
 Duplicate message, previous message already sent to Dr. Durene Cal.  Copied from CRM [id number]. Topic: General - Other >> May 13, 2023  5:01 PM Eunice Blase wrote: Reason for CRM: Pt would like to know if he can resume excise. Please call pt at [phone]

## 2023-05-14 NOTE — Telephone Encounter (Signed)
 Yes he can restart exercise as long as he is feeling well

## 2023-05-14 NOTE — Telephone Encounter (Signed)
 Pt called coumadin clinic because he just saw the VM left on 3/26 and did not know if it was an old msg.  Advised pt the msg was old.  Advised pt PCP said [facility] is [facility]. Pt verbalized understanding and was appreciative.

## 2023-05-20 NOTE — Progress Notes (Signed)
 [facility] Note   Date:  05/25/2023   ID:  Joseph Malone, DOB 1931/02/04, MRN [id number]  Location: Home  Provider location: [facility] Type of Visit: Established patient  PCP:  Almira Jaeger, MD  HF Cardiologist: Bensimhon  Chief Complaint: F/u for Systolic Heart Failure    HPI: Joseph Malone is a pleasant 89 y.o. male with a history of coronary artery disease status post large anterior MI with coronary  bypass grafting in 2001 with a LIMA to the LAD.  He has history of  congestive heart failure secondary to resultant ischemic cardiomyopathy, ejection fraction however is in the 35%.  He is status post  single-chamber ICD.    The rest of his medical history is notable for obesity, hypertension,  hyperlipidemia, and glucose intolerance.  Cath in 5/19 with stable CAD with patent LIMA to LAD.  Echo (10/23): EF stable 30-35%  Admitted 12/23 with GIB. INR 2.4 on admit, FOBT + and warfarin held. GI consulted, underwent colonoscopy showing 1 polyp removed. Had diverticulosis in the sigmoid colon and descending colon. There was a nonbleeding internal hemorrhoid. No clear evidence of recent GI bleed. Warfarin resumed. Hospitalization c/b by AKI, losartan and spiro held, only spiro restarted at discharge.   Presented to the ED 5/24 with chest pain. Woke up from sleep with 6/10 chest pain. Tums didn't help, SLNTG helped relieve his symptoms. Cardiology consulted. HsTrop 17>18. EKG no ST changes. Discharged same day with resolved symptoms.   Echo 6/24 EF 30-35%  Today he returns for AHF follow up. Just admitted to the hospital last month, 3/25 for recurrent GIB. Reported BRBPR, however Hgb was ok, 12 range, and he did not require blood transfusions. Coumadin was held. He Colonoscopy showing 2 polyps which were removed but thankfully pathology report showed only hyperplastic. Felt to have self-limited diverticular bleed. On discharge Coumadin was  resumed but he was ultimately switched to Eliquis, in an effort to reduce GIB risk.   Today in f/u, he denies any further bleeding but complaints of continued fatigue and decrease exercise tolerance and mild exertional dyspnea. Device interrogation shows good thoracic impedence and he reports wt loss and no gain.  BP well controlled. He notes occasional chest pressure/tightness if he over exerts but resolves quickly w/ rest. Has not required SL NTG use.    Cardiac Studies: - Echo (10/23): EF 30-35% - R/LHC 06/12/2017   Ao = 143/77 (105) LV 145/8 RA = 3 RV = 26/4 PA = 27/5 (12) PCW = 9 Fick cardiac output/index = 4.4/2.0 PVR = 0.7 WU FA sat = 97% PA sat = 62%, 63%  1. Left-dominant coronary system with chronically-occluded LAD 2. Non-obstructive CAD elsewhere 3. Patent LIMA to LAD 4. LV gram not performed due to LV thrombus 5. Normal right heart pressure with mildly reduced cardiac output  - Echo 05/2017 EF 30-35%   Past Medical History:  Diagnosis Date   Anxiety    BPH (benign prostatic hypertrophy)    CAD (coronary artery disease)    s/p CABG   CHF (congestive heart failure) (HCC)    Diabetes 1.5, managed as type 2 (HCC)    Diverticulosis    Diverticulosis of colon without hemorrhage 10/22/2007   Qualifier: Diagnosis of  By: [name] MD, [name]     DOE (dyspnea on exertion)    Excess weight    Fasting hyperglycemia    Generalized weakness    GERD (gastroesophageal reflux disease)  Heart attack (HCC) 2001   Heart failure    CHF due to ischemic CM   History of kidney stones    Hyperlipidemia    Hypertension    Ischemic cardiomyopathy    Microscopic hematuria    [facility]   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   Past Surgical History:  Procedure Laterality Date   APPENDECTOMY     BIOPSY  10/21/2018   Procedure: BIOPSY;  Surgeon: [name], MD;  Location: [facility];  Service: Gastroenterology;;   BIOPSY  12/15/2022   Procedure: BIOPSY;   Surgeon: [name], MD;  Location: [facility];  Service: Gastroenterology;;   CARDIAC CATHETERIZATION  2000   CARDIAC DEFIBRILLATOR PLACEMENT  2005   [employer]; s/p ICD gen change and RV lead revision April 2015   CATARACT EXTRACTION  2008   bilateral with lens implant   COLONOSCOPY  2004   Tics; [facility]   COLONOSCOPY N/A 04/17/2023   Procedure: COLONOSCOPY;  Surgeon: [name], MD;  Location: [facility];  Service: Gastroenterology;  Laterality: N/A;   COLONOSCOPY WITH PROPOFOL N/A 01/16/2022   Procedure: COLONOSCOPY WITH PROPOFOL;  Surgeon: [name], [name], MD;  Location: [facility];  Service: Gastroenterology;  Laterality: N/A;   CORONARY ARTERY BYPASS GRAFT  2000   1 vessel   CYSTOSCOPY  06/2012   Dr [name]   ENTEROSCOPY N/A 04/16/2023   Procedure: ENTEROSCOPY;  Surgeon: [name], MD;  Location: [facility];  Service: Gastroenterology;  Laterality: N/A;   ESOPHAGOGASTRODUODENOSCOPY (EGD) WITH PROPOFOL N/A 10/21/2018   Procedure: ESOPHAGOGASTRODUODENOSCOPY (EGD) WITH PROPOFOL;  Surgeon: [name], MD;  Location: [facility];  Service: Gastroenterology;  Laterality: N/A;   ESOPHAGOGASTRODUODENOSCOPY (EGD) WITH PROPOFOL N/A 12/15/2022   Procedure: ESOPHAGOGASTRODUODENOSCOPY (EGD) WITH PROPOFOL;  Surgeon: [name], MD;  Location: [facility];  Service: Gastroenterology;  Laterality: N/A;   FLEXIBLE SIGMOIDOSCOPY N/A 04/16/2023   Procedure: FLEXIBLE SIGMOIDOSCOPY;  Surgeon: [name], MD;  Location: [facility];  Service: Gastroenterology;  Laterality: N/A;   HOT HEMOSTASIS N/A 10/21/2018   Procedure: HOT HEMOSTASIS (ARGON PLASMA COAGULATION/BICAP);  Surgeon: [name], MD;  Location: [facility];  Service: Gastroenterology;  Laterality: N/A;   IMPLANTABLE CARDIOVERTER DEFIBRILLATOR (ICD) GENERATOR CHANGE N/A 06/01/2013   Procedure: ICD GENERATOR CHANGE;  Surgeon: [name], MD;  Location: [facility] CATH LAB;  Service: Cardiovascular;   Laterality: N/A;   LEAD REVISION N/A 06/01/2013   Procedure: LEAD REVISION;  Surgeon: [name], MD;  Location: [facility] CATH LAB;  Service: Cardiovascular;  Laterality: N/A;   PILONIDAL CYST EXCISION     POLYPECTOMY  01/16/2022   Procedure: POLYPECTOMY;  Surgeon: Sergio Dandy, MD;  Location: [facility];  Service: Gastroenterology;;   POLYPECTOMY  04/17/2023   Procedure: POLYPECTOMY;  Surgeon: [name], MD;  Location: [facility];  Service: Gastroenterology;;   RIGHT/LEFT HEART CATH AND CORONARY/GRAFT ANGIOGRAPHY N/A 06/12/2017   Procedure: RIGHT/LEFT HEART CATH AND CORONARY/GRAFT ANGIOGRAPHY;  Surgeon: Mardell Shade, MD;  Location: [facility] CV [facility];  Service: Cardiovascular;  Laterality: N/A;   TRANSTHORACIC ECHOCARDIOGRAM  08/29/2005   Current Outpatient Medications  Medication Sig Dispense Refill   acetaminophen (TYLENOL) 500 MG tablet Take 500-1,000 mg by mouth at bedtime.     apixaban (ELIQUIS) 2.5 MG TABS tablet Take 1 tablet (2.5 mg total) by mouth 2 (two) times daily. Do not start until instructed by coumadin clinic 60 tablet 11   folic acid (FOLVITE) 400  MCG tablet Take 400 mcg by mouth daily.     Glucosamine HCl 1500 MG TABS Take 1,500 mg by mouth every evening.      Melatonin 5 MG CHEW Chew 1 tablet by mouth at bedtime.     metFORMIN (GLUCOPHAGE-XR) 500 MG 24 hr tablet TAKE 1 TABLET(500 MG) BY MOUTH DAILY WITH BREAKFAST 90 tablet 3   Multiple Vitamin (MULTIVITAMIN WITH MINERALS) TABS Take 1 tablet by mouth at bedtime.     nitroGLYCERIN (NITROSTAT) 0.4 MG SL tablet Place 1 tablet (0.4 mg total) under the tongue every 5 (five) minutes as needed for chest pain. 1 tablet every 5 minutes for 3 dose only 90 tablet 3   omeprazole (PRILOSEC) 40 MG capsule Take 1 capsule (40 mg total) by mouth daily. 90 capsule 3   simvastatin (ZOCOR) 40 MG tablet Take 0.5 tablets (20 mg total) by mouth at bedtime. 45 tablet 2   spironolactone (ALDACTONE) 25 MG tablet TAKE 1/2 TABLET BY MOUTH  DAILY (Patient taking differently: Take 12.5 mg by mouth daily as needed (Weight gain/Swelling).) 90 tablet 3   tamsulosin (FLOMAX) 0.4 MG CAPS capsule Take 0.4 mg by mouth at bedtime.     vitamin B-12 (CYANOCOBALAMIN) 100 MCG tablet Take 100 mcg by mouth daily.     No current facility-administered medications for this encounter.   Allergies:   Penicillins   Social History:  The patient  reports that he has never smoked. He has been exposed to tobacco smoke. He has never used smokeless tobacco. He reports that he does not drink alcohol and does not use drugs.   Family History:  The patient's family history includes Diabetes in his brother; Heart attack (age of onset: 44) in his brother, father, and paternal uncle; Hypertension in his father; Lung cancer in his sister; Nephrolithiasis in his brother; Sudden death (age of onset: 64) in his paternal grandfather.   ROS:  Please see the history of present illness.   All other systems are personally reviewed and negative.   Recent Labs: 07/24/2022: BNP 79.8 10/22/2022: TSH 0.51 04/18/2023: Magnesium 2.0 04/28/2023: ALT 13; BUN 24; Creatinine, Ser 1.25; Hemoglobin 12.5; Platelets 165.0; Potassium 3.7; Sodium 142  Personally reviewed   Wt Readings from Last 3 Encounters:  05/25/23 98.4 kg (217 lb)  04/28/23 98.5 kg (217 lb 3.2 oz)  04/17/23 96.3 kg (212 lb 4.9 oz)   BP 120/72   Pulse 91   Ht 5\' 11"  (1.803 m)   Wt 98.4 kg (217 lb)   SpO2 94%   BMI 30.27 kg/m   PHYSICAL EXAM: General:  Well appearing. No respiratory difficulty HEENT: normal Neck: supple. no JVD. Carotids 2+ bilat; no bruits. No lymphadenopathy or thyromegaly appreciated. Cor: PMI nondisplaced. Regular rate & rhythm. No rubs, gallops or murmurs. Lungs: clear Abdomen: soft, nontender, nondistended. No hepatosplenomegaly. No bruits or masses. Good bowel sounds. Extremities: no cyanosis, clubbing, rash, edema Neuro: alert & oriented x 3, cranial nerves grossly intact. moves  all 4 extremities w/o difficulty. Affect pleasant.   ICD interrogation (personally reviewed): Optivol Fluid index good, 1 hr/day activity, no VT or AF    ASSESSMENT AND PLAN:  1) CAD:  - s/p CABG 2001 with LIMA to LAD  - Cath 5/19 with intact revascularization and normal filling pressures.  - Continue with mild chronic intermittent CP. Stable and resolves w/ rest   - Continue statin and ASA  2) Chronic systolic HF: ICM - Echo 05/2017 EF 30-35% with apical thrombus. [employer] ICD.  No shocks - Cath with 5/19 with intact revascularization and normal filling pressures  - Echo 10/23 EF unchanged at 30-35%.  - Echo 6/24 EF 30-35% - NYHA II-III but euvolemic on exam and device interrogations. Suspect likely IDA contributing to symptoms (see below)  - Device interrogation shows no VT  - Continue Lasix 40 mg PRN   - Off Coreg due to fatigued  - Off losartan due to low BP  - Continue spiro 12.5 mg QHS - No SGLT2i with hx of UTIs  - Check BMP   3) HTN - controlled on current regimen - no change today    5) HL - Managed by PCP.  - Continue statin. No change.   6) Apical Thrombus - previously on Coumadin, switched to Eliquis given recurrent GIB - he was placed on 2.5 mg dose given age and recent SCr of 1.5, though had repeat BMP since then and SCr was down to 1.2 - repeat BMP today. If SCr < 1.5, will d/w Dr. Julane Ny risk vs benefit of increasing to 5 mg bid   7) h/o GIB - colonoscopy 12/23 with 1 polyp removed, diverticulosis and non-bleeding internal hemorrhoids; no evidence of active GI bleed.  - recurrent GIB 3/25 per above, 2 polyps which were removed Felt to have self-limited diverticular bleed - Resolved, denies further bleeding - continue Eliquis for now  6. Exertional Fatigue/Dyspnea - suspect likely IDA in setting of recent GIB. H/H remained stable during hospitalization and did not require transfusion  - will check Iron, Ferritin and TIBC. Will refer for IV Fe if  indicated. Also repeat CBC and check BMP    F/u in 4-6 wks w/ Dr. Oretha Birch, PA-C  05/25/2023 12:26 PM  [facility] [address] [facility] [place] [address] [phone] (office) [phone] (fax)

## 2023-05-22 ENCOUNTER — Telehealth (HOSPITAL_COMMUNITY): Payer: Self-pay

## 2023-05-22 NOTE — Telephone Encounter (Signed)
 Called to confirm/remind patient of their appointment at the [facility] on 05/25/23.   Appointment:   [] Confirmed  [x] Left mess   [] No answer/No voice mail  [] Phone not in service  And to bring in all medications and/or complete list.  Confirmed patient has transportation. Gave directions, instructed to utilize valet parking.

## 2023-05-25 ENCOUNTER — Ambulatory Visit (HOSPITAL_COMMUNITY)
Admission: RE | Admit: 2023-05-25 | Discharge: 2023-05-25 | Disposition: A | Discharge status: Home / Self Care | Source: Ambulatory Visit | Attending: Cardiology | Admitting: Cardiology

## 2023-05-25 ENCOUNTER — Ambulatory Visit

## 2023-05-25 ENCOUNTER — Encounter (HOSPITAL_COMMUNITY): Payer: Self-pay

## 2023-05-25 VITALS — BP 120/72 | HR 91 | Ht 71.0 in | Wt 217.0 lb

## 2023-05-25 DIAGNOSIS — R5383 Other fatigue: Secondary | ICD-10-CM | POA: Insufficient documentation

## 2023-05-25 DIAGNOSIS — I2510 Atherosclerotic heart disease of native coronary artery without angina pectoris: Secondary | ICD-10-CM | POA: Insufficient documentation

## 2023-05-25 DIAGNOSIS — I252 Old myocardial infarction: Secondary | ICD-10-CM | POA: Diagnosis not present

## 2023-05-25 DIAGNOSIS — I5022 Chronic systolic (congestive) heart failure: Secondary | ICD-10-CM | POA: Insufficient documentation

## 2023-05-25 DIAGNOSIS — E669 Obesity, unspecified: Secondary | ICD-10-CM | POA: Insufficient documentation

## 2023-05-25 DIAGNOSIS — Z7901 Long term (current) use of anticoagulants: Secondary | ICD-10-CM | POA: Insufficient documentation

## 2023-05-25 DIAGNOSIS — R0609 Other forms of dyspnea: Secondary | ICD-10-CM | POA: Diagnosis not present

## 2023-05-25 DIAGNOSIS — I255 Ischemic cardiomyopathy: Secondary | ICD-10-CM | POA: Diagnosis not present

## 2023-05-25 DIAGNOSIS — Z951 Presence of aortocoronary bypass graft: Secondary | ICD-10-CM | POA: Diagnosis not present

## 2023-05-25 DIAGNOSIS — I110 Hypertensive heart disease with heart failure: Secondary | ICD-10-CM | POA: Diagnosis not present

## 2023-05-25 DIAGNOSIS — E785 Hyperlipidemia, unspecified: Secondary | ICD-10-CM | POA: Diagnosis not present

## 2023-05-25 DIAGNOSIS — I513 Intracardiac thrombosis, not elsewhere classified: Secondary | ICD-10-CM | POA: Insufficient documentation

## 2023-05-25 DIAGNOSIS — Z95810 Presence of automatic (implantable) cardiac defibrillator: Secondary | ICD-10-CM | POA: Diagnosis not present

## 2023-05-25 DIAGNOSIS — E7439 Other disorders of intestinal carbohydrate absorption: Secondary | ICD-10-CM | POA: Insufficient documentation

## 2023-05-25 LAB — IRON AND TIBC
Iron: 209 ug/dL — ABNORMAL HIGH (ref 45–182)
Saturation Ratios: 60 % — ABNORMAL HIGH (ref 17.9–39.5)
TIBC: 347 ug/dL (ref 250–450)
UIBC: 138 ug/dL

## 2023-05-25 LAB — FERRITIN: Ferritin: 19 ng/mL — ABNORMAL LOW (ref 24–336)

## 2023-05-25 LAB — BASIC METABOLIC PANEL WITH GFR
Anion gap: 7 (ref 5–15)
BUN: 30 mg/dL — ABNORMAL HIGH (ref 8–23)
CO2: 24 mmol/L (ref 22–32)
Calcium: 9.6 mg/dL (ref 8.9–10.3)
Chloride: 111 mmol/L (ref 98–111)
Creatinine, Ser: 1.46 mg/dL — ABNORMAL HIGH (ref 0.61–1.24)
GFR, Estimated: 45 mL/min — ABNORMAL LOW (ref >60)
Glucose, Bld: 121 mg/dL — ABNORMAL HIGH (ref 70–99)
Potassium: 4.3 mmol/L (ref 3.5–5.1)
Sodium: 142 mmol/L (ref 135–145)

## 2023-05-25 LAB — CBC
HCT: 42.2 % (ref 39.0–52.0)
Hemoglobin: 13.9 g/dL (ref 13.0–17.0)
MCH: 30.5 pg (ref 26.0–34.0)
MCHC: 32.9 g/dL (ref 30.0–36.0)
MCV: 92.7 fL (ref 80.0–100.0)
Platelets: 153 10*3/uL (ref 150–400)
RBC: 4.55 MIL/uL (ref 4.22–5.81)
RDW: 12.5 % (ref 11.5–15.5)
WBC: 7.8 10*3/uL (ref 4.0–10.5)
nRBC: 0 % (ref 0.0–0.2)

## 2023-05-25 NOTE — Patient Instructions (Addendum)
 Thank you for coming in today  If you had labs drawn today, any labs that are abnormal the clinic will call you No news is good news  Medications: No changes  Follow up appointments:  Your physician recommends that you schedule a follow-up appointment in:  4-6 weeks With Dr. Gala Romney    Do the following things EVERYDAY: Weigh yourself in the morning before breakfast. Write it down and keep it in a log. Take your medicines as prescribed Eat low salt foods--Limit salt (sodium) to 2000 mg per day.  Stay as active as you can everyday Limit all fluids for the day to less than 2 liters   At the [facility], you and your health needs are our priority. As part of our continuing mission to provide you with exceptional heart care, we have created designated Provider Care Teams. These Care Teams include your primary Cardiologist (physician) and Advanced Practice Providers (APPs- Physician Assistants and Nurse Practitioners) who all work together to provide you with the care you need, when you need it.   You may see any of the following providers on your designated Care Team at your next follow up: Dr Arvilla Meres Dr Marca Ancona Dr. Marcos Eke, NP Robbie Lis, [place] [facility] [place], [place] Brynda Peon, NP Karle Plumber, PharmD   Please be sure to bring in all your medications bottles to every appointment.    Thank you for choosing [facility]  If you have any questions or concerns before your next appointment please send [place] a message through [place] or call our office at [phone].    TO LEAVE A MESSAGE FOR THE NURSE SELECT OPTION 2, PLEASE LEAVE A MESSAGE INCLUDING: YOUR NAME DATE OF BIRTH CALL BACK NUMBER REASON FOR CALL**this is important as we prioritize the call backs  YOU WILL RECEIVE A CALL BACK THE SAME DAY AS LONG AS YOU CALL BEFORE 4:00 PM

## 2023-05-26 ENCOUNTER — Telehealth (HOSPITAL_COMMUNITY): Payer: Self-pay | Admitting: *Deleted

## 2023-05-26 NOTE — Telephone Encounter (Signed)
 Called patient per Ruddy Corral, PA with following:  "Let patient know that I reviewed labs w/ Dr. Julane Ny. Iron levels ok. Does not need IV iron."  Pt verbalized understanding of same.

## 2023-05-27 LAB — CUP PACEART REMOTE DEVICE CHECK
Battery Remaining Longevity: 29 mo
Battery Voltage: 2.94 V
Brady Statistic RV Percent Paced: 0.25 %
Date Time Interrogation Session: 20250414124850
HighPow Impedance: 66 Ohm
Implantable Lead Connection Status: 753985
Implantable Lead Implant Date: 20150422
Implantable Lead Location: 753860
Implantable Lead Model: 6935
Implantable Pulse Generator Implant Date: 20150422
Lead Channel Impedance Value: 399 Ohm
Lead Channel Impedance Value: 475 Ohm
Lead Channel Pacing Threshold Amplitude: 0.375 V
Lead Channel Pacing Threshold Pulse Width: 0.4 ms
Lead Channel Sensing Intrinsic Amplitude: 10.25 mV
Lead Channel Sensing Intrinsic Amplitude: 10.25 mV
Lead Channel Setting Pacing Amplitude: 2 V
Lead Channel Setting Pacing Pulse Width: 0.4 ms
Lead Channel Setting Sensing Sensitivity: 0.3 mV

## 2023-06-16 ENCOUNTER — Ambulatory Visit: Payer: Self-pay | Admitting: *Deleted

## 2023-06-16 NOTE — Telephone Encounter (Signed)
 It is fine for him to take his evening Eliquis -typical dose of Eliquis  is 5 mg for most patients-so  taking 2 of the 2.5 should not cause any harm especially if just a one-time occurrence

## 2023-06-16 NOTE — Telephone Encounter (Signed)
 See below

## 2023-06-16 NOTE — Telephone Encounter (Signed)
  Chief Complaint: took double dose of eliquis  2.5 mg this am at 1130 by mistake.  Symptoms: denies sx reports weakness prior to today but no sx since taking extra dose of eliquis  . Reports pill dropped out of pill box and patient thought it was his eliquis , after taking another pill noticed the eliquis  dropped out of a different day in the pill boxes.  Frequency: today at 1130 am  Pertinent Negatives: Patient denies chest pain no difficulty breathing no dizziness no bleeding  Disposition: [] ED /[] Urgent Care (no appt availability in office) / [] Appointment(In office/virtual)/ []  [facility]/ [] Home Care/ [] Refused Recommended Disposition /[] [facility]/ [x]  Follow-up with PCP Additional Notes:   Poison control contacted per protocol and due to no sx other than previous weakness patient instructed to monitor sx and call back for any changes in dizziness lightheadedness, falls, bleeding. Instructed patient not to take tonight's dose of eliquis  unless PCP instructs to do so. CAL notified and office at lunch at this time. Please advise patient regarding tonight's dose of eliquis .       Copied from CRM [id number]. Topic: Clinical - Red Word Triage >> Jun 16, 2023 12:10 PM Alyse July wrote: Red Word that prompted transfer to Nurse Triage: took to many pills (Eliquis ) Reason for Disposition  [1] DOUBLE DOSE (an extra dose or lesser amount) of prescription drug AND [2] NO symptoms  (Exception: A double dose of antibiotics.)  Answer Assessment - Initial Assessment Questions 1. SUBSTANCE: "What was swallowed?" If necessary, have the caller look at the product or drug label on the container to determine active ingredients.     Extra pill of eliquis  2.5 mg  2. AMOUNT: "How much was swallowed?" (e.g., what was the possible maximum amount)      Took total of 5 mg of eliquis  by mistake this am at 1130  3. ONSET: "When was it probably swallowed?" (Minutes or hours ago)      1130 am  today  4. SYMPTOMS: "Do you have any symptoms?" If Yes, ask: "What are they?" (e.g., abdomen pain, vomiting, weakness)      Reports none now , did feel some weakness prior to taking medication  5. TREATMENT: "Have you done anything to treat this?" If Yes, ask: "What did you do?"     No  6. SUICIDAL: "Did you take this to hurt or kill yourself?"     No .  7. PREGNANCY: "Is there any chance you are pregnant?" "When was your last menstrual period?"     Na  Protocols used: Poisoning-A-AH

## 2023-06-16 NOTE — Telephone Encounter (Signed)
Called and lm on pt vm with below information. 

## 2023-06-18 ENCOUNTER — Encounter: Payer: Self-pay | Admitting: Family Medicine

## 2023-06-18 ENCOUNTER — Ambulatory Visit: Payer: Medicare Other | Admitting: Family Medicine

## 2023-06-18 VITALS — BP 102/58 | HR 89 | Temp 99.0°F | Ht 71.0 in | Wt 217.4 lb

## 2023-06-18 DIAGNOSIS — I5022 Chronic systolic (congestive) heart failure: Secondary | ICD-10-CM

## 2023-06-18 DIAGNOSIS — Z7984 Long term (current) use of oral hypoglycemic drugs: Secondary | ICD-10-CM | POA: Diagnosis not present

## 2023-06-18 DIAGNOSIS — E1169 Type 2 diabetes mellitus with other specified complication: Secondary | ICD-10-CM | POA: Diagnosis not present

## 2023-06-18 DIAGNOSIS — I10 Essential (primary) hypertension: Secondary | ICD-10-CM

## 2023-06-18 DIAGNOSIS — E785 Hyperlipidemia, unspecified: Secondary | ICD-10-CM

## 2023-06-18 DIAGNOSIS — I2510 Atherosclerotic heart disease of native coronary artery without angina pectoris: Secondary | ICD-10-CM

## 2023-06-18 DIAGNOSIS — E119 Type 2 diabetes mellitus without complications: Secondary | ICD-10-CM

## 2023-06-18 NOTE — Patient Instructions (Addendum)
 Hold spironolactone  half tablet for now unless blood pressure above 150  I want you to watch your weight  (lets say if above 211) and swelling and if these worsen take your lasix  as needed.  I am going to reach back out to Dr. Julane Ny and ask his opinion on this as the spironolactone  is good for the heart in general beyond just being for fluid. We already had to stop the losartan  and carvedilol  which are good for the heart in general. I think your prior weight loss has contributed  Please stop by lab before you go If you have mychart- we will send your results within 3 business days of us  receiving them.  If you do not have mychart- we will call you about results within 5 business days of us  receiving them.  *please also note that you will see labs on mychart as soon as they post. I will later go in and write notes on them- will say "notes from Dr. Arlene Ben"   Recommended follow up: Return in about 4 months (around 10/19/2023) for physical or sooner if needed.Schedule b4 you leave.

## 2023-06-18 NOTE — Progress Notes (Signed)
 Phone [phone] In person visit   Subjective:   Joseph Malone is a 89 y.o. year old very pleasant male patient who presents for/with See problem oriented charting Chief Complaint  Patient presents with   Follow-up    Non fasting. Patient was taken off Wellbutrin and put on Eliquis . States he feels like everything is going pretty well.    Past Medical History-  Patient Active Problem List   Diagnosis Date Noted   Multiple gastric ulcers 12/15/2022    Priority: High   GI bleeding 01/13/2022    Priority: High   Left ventricular thrombus 01/13/2022    Priority: High   Chronic systolic heart failure (HCC) 12/09/2010    Priority: High   Ischemic cardiomyopathy 10/02/2009    Priority: High   ICD (implantable cardioverter-defibrillator) in place 01/16/2009    Priority: High   Non-insulin  dependent type 2 diabetes mellitus (HCC) 09/14/2008    Priority: High   CAD in native artery 09/25/2006    Priority: High   Esophageal stricture 12/15/2022    Priority: Medium    CKD stage 3b, GFR 30-44 ml/min (HCC) 12/05/2022    Priority: Medium    Atherosclerosis of aorta (HCC) 05/18/2020    Priority: Medium    Nephrolithiasis 03/24/2016    Priority: Medium    BPH (benign prostatic hyperplasia) 10/22/2007    Priority: Medium    Hyperlipidemia associated with type 2 diabetes mellitus (HCC) 04/22/2007    Priority: Medium    Essential hypertension 09/25/2006    Priority: Medium    COVID-19 virus infection 12/07/2022    Priority: Low   Polyp of ascending colon 01/16/2022    Priority: Low   PVC's (premature ventricular contractions) 01/24/2020    Priority: Low   Erectile dysfunction 01/19/2014    Priority: Low   Malignant basal cell neoplasm of skin 09/29/2012    Priority: Low   Solitary pulmonary nodule 07/12/2012    Priority: Low   Osteoarthritis 09/25/2006    Priority: Low   Hematuria 09/25/2006    Priority: Low   Adenomatous polyp of sigmoid colon 04/17/2023    Diverticulosis of colon with hemorrhage 04/17/2023   Polyp of cecum 04/17/2023   GI bleed 04/14/2023   Hematochezia 04/13/2023   Duodenal ulcer 12/15/2022    Medications- reviewed and updated Current Outpatient Medications  Medication Sig Dispense Refill   acetaminophen  (TYLENOL ) 500 MG tablet Take 500-1,000 mg by mouth at bedtime.     apixaban  (ELIQUIS ) 2.5 MG TABS tablet Take 1 tablet (2.5 mg total) by mouth 2 (two) times daily. Do not start until instructed by coumadin  clinic 60 tablet 11   folic acid  (FOLVITE ) 400 MCG tablet Take 400 mcg by mouth daily.     Glucosamine HCl 1500 MG TABS Take 1,500 mg by mouth every evening.      Melatonin 5 MG CHEW Chew 1 tablet by mouth at bedtime.     metFORMIN  (GLUCOPHAGE -XR) 500 MG 24 hr tablet TAKE 1 TABLET(500 MG) BY MOUTH DAILY WITH BREAKFAST 90 tablet 3   Multiple Vitamin (MULTIVITAMIN WITH MINERALS) TABS Take 1 tablet by mouth at bedtime.     nitroGLYCERIN  (NITROSTAT ) 0.4 MG SL tablet Place 1 tablet (0.4 mg total) under the tongue every 5 (five) minutes as needed for chest pain. 1 tablet every 5 minutes for 3 dose only 90 tablet 3   omeprazole  (PRILOSEC) 40 MG capsule Take 1 capsule (40 mg total) by mouth daily. 90 capsule 3   simvastatin  (ZOCOR ) 40 MG  tablet Take 0.5 tablets (20 mg total) by mouth at bedtime. 45 tablet 2   spironolactone  (ALDACTONE ) 25 MG tablet TAKE 1/2 TABLET BY MOUTH DAILY (Patient taking differently: Take 12.5 mg by mouth daily as needed (Weight gain/Swelling).) 90 tablet 3   tamsulosin  (FLOMAX ) 0.4 MG CAPS capsule Take 0.4 mg by mouth at bedtime.     vitamin B-12 (CYANOCOBALAMIN ) 100 MCG tablet Take 100 mcg by mouth daily.     No current facility-administered medications for this visit.     Objective:  BP (!) 102/58   Pulse 89   Temp 99 F (37.2 C) (Temporal)   Ht 5\' 11"  (1.803 m)   Wt 217 lb 6.4 oz (98.6 kg)   SpO2 95%   BMI 30.32 kg/m  Gen: NAD, resting comfortably CV: RRR no murmurs rubs or gallops Lungs:  CTAB no crackles, wheeze, rhonchi Ext: minimal edema Skin: warm, dry     Assessment and Plan   #weak spells:  -April 17th was out shopping- stopped to get something to eat- blood pressure was 90/50- improved after hour of sleep 100/67 with pulse 91.  - happened another time after a shower- 96/60 blood pressure and pulse 86 - may 5th when he got up in am and was 136/108- not feeling bad. Then later 145/86 and next day ok  - has also just in general felt too weak too exercise and has had to move his showers to evening as feels weak in the am -sugar has been above 100 with these episodes -did have prior gastroenterology bleed and ferritin was slightly low 3 weeks ago but other #s on iron panel suggest levels increasing - for adjustments- see under hypertension section below  #Heart failure with reduced ejection fraction/ ischemic cardiomypopathy- Dr. Julane Ny #hypertension S: Medication:Lasix  40 mg as needed (takes potassium  with Lasix ), spironolactone  12.5 mg  at night -prior losartan  25 mg daily but blood pressure too low -prior Carvedilol  3.125 mg twice daily -EF 07/25/22 of 30-35%   stable  Edema: no increase Weight gain:none- stable around 210 at home  A/P:  CHF - euvolemic but worried about blood pressure being too low so see adjustments under hypertension  Hypertension - Hold spironolactone  half tablet for now unless blood pressure above 150  I want you to watch your weight  (lets say if above 211) and swelling and if these worsen take your lasix  as needed.  I am going to reach back out to Dr. Julane Ny and ask his opinion on this as the spironolactone  is good for the heart in general beyond just being for fluid. We already had to stop the losartan  and carvedilol  which are good for the heart in general. I think your prior weight loss has contributed  #CAD with defibrillator in place/hyperlipidemia #statin myalgia on higher doser #Apical thrombus-on Eliquis  2.5 mg twice daily   S: Medication:Eliquis  2.5 mg twice daily (previously required both Coumadin  for apical thrombus as well as aspirin  but Dr. Bensimhon has okayed Eliquis  alone), simvastatin  40 mg (half of 40 mg)  -imdur  15 mg caused low blood pressure into 80's with weakness and headache(s)  -occasional mild heaviness doesn't last long- if lays down resolves still. Has had pain long term but perhaps mildly more frequent  A/P: CAD with ongoing angina- perhaps mildly more frequent- I want him to see cardiology if pain worsens anymore than already has  # Diabetes S: Medication:metformin  500 mg xr CBGs- Home readings have been between 120-150 fasting  Lab Results  Component Value Date   HGBA1C 6.4 02/17/2023   HGBA1C 6.1 10/16/2022   HGBA1C 5.9 (A) 06/02/2022  A/P: hopefully stable- update a1c today. Continue current meds for now   #BPH- on tamsulosin  0.4 mg at night- is not having a lot of orthostatic symptoms so will hold steady  Recommended follow up: Return in about 4 months (around 10/19/2023) for physical or sooner if needed.Schedule b4 you leave. Future Appointments  Date Time Provider Department Center  07/07/2023  2:00 PM [facility] PA/NP [facility] None  08/24/2023  7:15 AM [facility] DEVICE REMOTES [facility]  08/27/2023  3:00 PM [facility] ANNUAL WELLNESS VISIT 1 [facility] PEC  11/23/2023  7:15 AM [facility] DEVICE REMOTES [facility]  02/22/2024  7:15 AM [facility] DEVICE REMOTES [facility]    Lab/Order associations:   ICD-10-CM   1. CAD in native artery  I25.10 Comprehensive metabolic panel with GFR    CBC with Differential/Platelet    2. Chronic systolic heart failure (HCC)  U98.11     3. Non-insulin  dependent type 2 diabetes mellitus (HCC)  E11.9 Comprehensive metabolic panel with GFR    CBC with Differential/Platelet    Hemoglobin A1c    4. Essential hypertension  I10     5. Hyperlipidemia associated with type 2 diabetes mellitus (HCC)  E11.69 TSH   E78.5       No orders of the defined  types were placed in this encounter.   Return precautions advised.  Clarisa Crooked, MD

## 2023-06-19 LAB — CBC WITH DIFFERENTIAL/PLATELET
Basophils Absolute: 0.1 10*3/uL (ref 0.0–0.1)
Basophils Relative: 1.1 % (ref 0.0–3.0)
Eosinophils Absolute: 0.1 10*3/uL (ref 0.0–0.7)
Eosinophils Relative: 1.6 % (ref 0.0–5.0)
HCT: 42.6 % (ref 39.0–52.0)
Hemoglobin: 14.2 g/dL (ref 13.0–17.0)
Lymphocytes Relative: 30.9 % (ref 12.0–46.0)
Lymphs Abs: 2.3 10*3/uL (ref 0.7–4.0)
MCHC: 33.2 g/dL (ref 30.0–36.0)
MCV: 93.5 fl (ref 78.0–100.0)
Monocytes Absolute: 0.5 10*3/uL (ref 0.1–1.0)
Monocytes Relative: 6.8 % (ref 3.0–12.0)
Neutro Abs: 4.4 10*3/uL (ref 1.4–7.7)
Neutrophils Relative %: 59.6 % (ref 43.0–77.0)
Platelets: 156 10*3/uL (ref 150.0–400.0)
RBC: 4.56 Mil/uL (ref 4.22–5.81)
RDW: 13.5 % (ref 11.5–15.5)
WBC: 7.4 10*3/uL (ref 4.0–10.5)

## 2023-06-19 LAB — HEMOGLOBIN A1C: Hgb A1c MFr Bld: 6.1 % (ref 4.6–6.5)

## 2023-06-19 LAB — COMPREHENSIVE METABOLIC PANEL WITH GFR
ALT: 13 U/L (ref 0–53)
AST: 17 U/L (ref 0–37)
Albumin: 4 g/dL (ref 3.5–5.2)
Alkaline Phosphatase: 60 U/L (ref 39–117)
BUN: 25 mg/dL — ABNORMAL HIGH (ref 6–23)
CO2: 23 meq/L (ref 19–32)
Calcium: 9.1 mg/dL (ref 8.4–10.5)
Chloride: 108 meq/L (ref 96–112)
Creatinine, Ser: 1.37 mg/dL (ref 0.40–1.50)
GFR: 45.15 mL/min — ABNORMAL LOW (ref >60.00)
Glucose, Bld: 122 mg/dL — ABNORMAL HIGH (ref 70–99)
Potassium: 4.5 meq/L (ref 3.5–5.1)
Sodium: 141 meq/L (ref 135–145)
Total Bilirubin: 0.4 mg/dL (ref 0.2–1.2)
Total Protein: 6.4 g/dL (ref 6.0–8.3)

## 2023-06-19 LAB — TSH: TSH: 0.26 u[IU]/mL — ABNORMAL LOW (ref 0.35–5.50)

## 2023-06-29 DIAGNOSIS — H5203 Hypermetropia, bilateral: Secondary | ICD-10-CM | POA: Diagnosis not present

## 2023-06-29 LAB — HM DIABETES EYE EXAM

## 2023-07-07 ENCOUNTER — Encounter (HOSPITAL_COMMUNITY): Payer: Self-pay

## 2023-07-07 ENCOUNTER — Ambulatory Visit (HOSPITAL_COMMUNITY)
Admission: RE | Admit: 2023-07-07 | Discharge: 2023-07-07 | Disposition: A | Discharge status: Home / Self Care | Source: Ambulatory Visit | Attending: Cardiology | Admitting: Cardiology

## 2023-07-07 ENCOUNTER — Telehealth (HOSPITAL_COMMUNITY): Payer: Self-pay

## 2023-07-07 VITALS — BP 136/88 | HR 86 | Ht 71.0 in | Wt 219.2 lb

## 2023-07-07 DIAGNOSIS — I110 Hypertensive heart disease with heart failure: Secondary | ICD-10-CM | POA: Insufficient documentation

## 2023-07-07 DIAGNOSIS — Z7901 Long term (current) use of anticoagulants: Secondary | ICD-10-CM | POA: Insufficient documentation

## 2023-07-07 DIAGNOSIS — E785 Hyperlipidemia, unspecified: Secondary | ICD-10-CM | POA: Insufficient documentation

## 2023-07-07 DIAGNOSIS — R5383 Other fatigue: Secondary | ICD-10-CM | POA: Insufficient documentation

## 2023-07-07 DIAGNOSIS — Z79899 Other long term (current) drug therapy: Secondary | ICD-10-CM | POA: Diagnosis not present

## 2023-07-07 DIAGNOSIS — K5730 Diverticulosis of large intestine without perforation or abscess without bleeding: Secondary | ICD-10-CM | POA: Insufficient documentation

## 2023-07-07 DIAGNOSIS — Z8719 Personal history of other diseases of the digestive system: Secondary | ICD-10-CM

## 2023-07-07 DIAGNOSIS — I513 Intracardiac thrombosis, not elsewhere classified: Secondary | ICD-10-CM

## 2023-07-07 DIAGNOSIS — I5022 Chronic systolic (congestive) heart failure: Secondary | ICD-10-CM | POA: Insufficient documentation

## 2023-07-07 DIAGNOSIS — Z6830 Body mass index (BMI) 30.0-30.9, adult: Secondary | ICD-10-CM | POA: Diagnosis not present

## 2023-07-07 DIAGNOSIS — I10 Essential (primary) hypertension: Secondary | ICD-10-CM | POA: Diagnosis not present

## 2023-07-07 DIAGNOSIS — R0602 Shortness of breath: Secondary | ICD-10-CM | POA: Diagnosis not present

## 2023-07-07 DIAGNOSIS — Z7984 Long term (current) use of oral hypoglycemic drugs: Secondary | ICD-10-CM | POA: Insufficient documentation

## 2023-07-07 DIAGNOSIS — Z951 Presence of aortocoronary bypass graft: Secondary | ICD-10-CM | POA: Insufficient documentation

## 2023-07-07 DIAGNOSIS — Z86718 Personal history of other venous thrombosis and embolism: Secondary | ICD-10-CM | POA: Insufficient documentation

## 2023-07-07 DIAGNOSIS — Z7722 Contact with and (suspected) exposure to environmental tobacco smoke (acute) (chronic): Secondary | ICD-10-CM | POA: Diagnosis not present

## 2023-07-07 DIAGNOSIS — E139 Other specified diabetes mellitus without complications: Secondary | ICD-10-CM | POA: Diagnosis not present

## 2023-07-07 DIAGNOSIS — I2510 Atherosclerotic heart disease of native coronary artery without angina pectoris: Secondary | ICD-10-CM | POA: Diagnosis not present

## 2023-07-07 DIAGNOSIS — I252 Old myocardial infarction: Secondary | ICD-10-CM | POA: Diagnosis not present

## 2023-07-07 DIAGNOSIS — E669 Obesity, unspecified: Secondary | ICD-10-CM | POA: Diagnosis not present

## 2023-07-07 DIAGNOSIS — Z95810 Presence of automatic (implantable) cardiac defibrillator: Secondary | ICD-10-CM | POA: Diagnosis not present

## 2023-07-07 NOTE — Progress Notes (Signed)
 ReDS Vest / Clip - 07/07/23 1424       ReDS Vest / Clip   Station Marker D    Ruler Value 32    ReDS Value Range Low volume    ReDS Actual Value 28    Anatomical Comments sitting

## 2023-07-07 NOTE — Telephone Encounter (Signed)
 Called to confirm/remind patient of their appointment at the [facility] on 07/07/23.   Appointment:   [] Confirmed  [x] Left mess   [] No answer/No voice mail  [] VM Full/unable to leave message  [] Phone not in service

## 2023-07-07 NOTE — Progress Notes (Signed)
 [facility] Note  PCP:  Almira Jaeger, MD  HF Cardiologist: Dr. Julane Ny  Reason for Visit: Heart Failure Follow-up HPI: Joseph Malone is a pleasant 89 y.o. male with a history of coronary artery disease status post large anterior MI with coronary  bypass grafting in 2001 with a LIMA to the LAD.  He has history of  congestive heart failure secondary to resultant ischemic cardiomyopathy, ejection fraction however is in the 35%.  He is s/p single-chamber ICD.    The rest of his medical history is notable for obesity, hypertension,  hyperlipidemia, and glucose intolerance.  Cath in 2019 with stable CAD with patent LIMA to LAD.  Echo (10/23): EF stable 30-35%  Admitted 12/23 with GIB. INR 2.4 on admit, FOBT + and warfarin held. GI consulted, underwent colonoscopy showing 1 polyp removed. Had diverticulosis in the sigmoid colon and descending colon. There was a nonbleeding internal hemorrhoid. No clear evidence of recent GI bleed. Warfarin resumed. Hospitalization c/b by AKI, losartan  and spiro held, only spiro restarted at discharge.   Presented to the ED 5/24 with chest pain. Woke up from sleep with 6/10 chest pain. Tums didn't help, SLNTG helped relieve his symptoms. Cardiology consulted. HsTrop 17>18. EKG no ST changes. Discharged same day with resolved symptoms.   Echo 6/24 EF 30-35%  Admitted 3/25 for recurrent GIB. Did not require transfusion. Coumadin  held. Colonoscopy with 2 hyperplasic polyps. He was switched to Eliquis  at discharge.   He returns today for heart failure follow up. Overall feeling okay. NYHA II. Reports he has been having some lightheadedness with lower blood pressures recently, as low as 80/60s. His PCP has stopped his spiro with resolution of symptom. Has SOB and fatigue with exertion, however reports that this has been going on for 6 years. Denies chest pain, near-syncope, orthopnea, palpitations, and abnormal bleeding. Able to perform ADLs, does  have to stop and rest at him. Appetite okay. Weight at home 212 lbs. BP at home 120/80s. Compliant with all medications. Has been taking PRN Lasix  3x/week with weight gain of 2lbs in a day.  Labs reviewed from 06/18/23.  Cardiac Studies: - Echo (6/24): EF 30-35%, mildly reduced RV - Echo (10/23): EF 30-35% - R/LHC (5/19): RA 3, PA 27/5 (12), PCWP 9, CO/CI (fick) 4.4/2.0, PVR 0.7 WU. 1. Left dominant with chronic occluded LAD, non-obstructive CAD elsewhere, patent LIMA to LAD, LV thrombus,    - Echo (4/19): EF 30-35%   Past Medical History:  Diagnosis Date   Anxiety    BPH (benign prostatic hypertrophy)    CAD (coronary artery disease)    s/p CABG   CHF (congestive heart failure) (HCC)    Diabetes 1.5, managed as type 2 (HCC)    Diverticulosis    Diverticulosis of colon without hemorrhage 10/22/2007   Qualifier: Diagnosis of  By: [name] MD, [name]     DOE (dyspnea on exertion)    Excess weight    Fasting hyperglycemia    Generalized weakness    GERD (gastroesophageal reflux disease)    Heart attack (HCC) 2001   Heart failure    CHF due to ischemic CM   History of kidney stones    Hyperlipidemia    Hypertension    Ischemic cardiomyopathy    Microscopic hematuria    [facility]   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   Past Surgical History:  Procedure Laterality Date   APPENDECTOMY     BIOPSY  10/21/2018  Procedure: BIOPSY;  Surgeon: [name], MD;  Location: [facility];  Service: Gastroenterology;;   BIOPSY  12/15/2022   Procedure: BIOPSY;  Surgeon: [name], MD;  Location: [facility];  Service: Gastroenterology;;   CARDIAC CATHETERIZATION  2000   CARDIAC DEFIBRILLATOR PLACEMENT  2005   [employer]; s/p ICD gen change and RV lead revision April 2015   CATARACT EXTRACTION  2008   bilateral with lens implant   COLONOSCOPY  2004   Tics; [facility]   COLONOSCOPY N/A 04/17/2023   Procedure: COLONOSCOPY;  Surgeon: [name], MD;  Location:  [facility];  Service: Gastroenterology;  Laterality: N/A;   COLONOSCOPY WITH PROPOFOL  N/A 01/16/2022   Procedure: COLONOSCOPY WITH PROPOFOL ;  Surgeon: Sergio Dandy, MD;  Location: [facility];  Service: Gastroenterology;  Laterality: N/A;   CORONARY ARTERY BYPASS GRAFT  2000   1 vessel   CYSTOSCOPY  06/2012   Dr [name]   ENTEROSCOPY N/A 04/16/2023   Procedure: ENTEROSCOPY;  Surgeon: [name], MD;  Location: [facility];  Service: Gastroenterology;  Laterality: N/A;   ESOPHAGOGASTRODUODENOSCOPY (EGD) WITH PROPOFOL  N/A 10/21/2018   Procedure: ESOPHAGOGASTRODUODENOSCOPY (EGD) WITH PROPOFOL ;  Surgeon: [name], MD;  Location: [facility];  Service: Gastroenterology;  Laterality: N/A;   ESOPHAGOGASTRODUODENOSCOPY (EGD) WITH PROPOFOL  N/A 12/15/2022   Procedure: ESOPHAGOGASTRODUODENOSCOPY (EGD) WITH PROPOFOL ;  Surgeon: [name], MD;  Location: [facility];  Service: Gastroenterology;  Laterality: N/A;   FLEXIBLE SIGMOIDOSCOPY N/A 04/16/2023   Procedure: FLEXIBLE SIGMOIDOSCOPY;  Surgeon: [name], MD;  Location: [facility];  Service: Gastroenterology;  Laterality: N/A;   HOT HEMOSTASIS N/A 10/21/2018   Procedure: HOT HEMOSTASIS (ARGON PLASMA COAGULATION/BICAP);  Surgeon: [name], MD;  Location: [facility];  Service: Gastroenterology;  Laterality: N/A;   IMPLANTABLE CARDIOVERTER DEFIBRILLATOR (ICD) GENERATOR CHANGE N/A 06/01/2013   Procedure: ICD GENERATOR CHANGE;  Surgeon: [name], MD;  Location: [facility] CATH LAB;  Service: Cardiovascular;  Laterality: N/A;   LEAD REVISION N/A 06/01/2013   Procedure: LEAD REVISION;  Surgeon: [name], MD;  Location: [facility] CATH LAB;  Service: Cardiovascular;  Laterality: N/A;   PILONIDAL CYST EXCISION     POLYPECTOMY  01/16/2022   Procedure: POLYPECTOMY;  Surgeon: Sergio Dandy, MD;  Location: [facility];  Service: Gastroenterology;;   POLYPECTOMY  04/17/2023   Procedure: POLYPECTOMY;  Surgeon: [name], MD;  Location: [facility];  Service: Gastroenterology;;   RIGHT/LEFT HEART CATH AND CORONARY/GRAFT ANGIOGRAPHY N/A 06/12/2017   Procedure: RIGHT/LEFT HEART CATH AND CORONARY/GRAFT ANGIOGRAPHY;  Surgeon: Mardell Shade, MD;  [facility]: [facility];  Service: Cardiovascular;  Laterality: N/A;   TRANSTHORACIC ECHOCARDIOGRAM  08/29/2005   Current Outpatient Medications  Medication Sig Dispense Refill   acetaminophen  (TYLENOL ) 500 MG tablet Take 500-1,000 mg by mouth at bedtime.     apixaban  (ELIQUIS ) 2.5 MG TABS tablet Take 1 tablet (2.5 mg total) by mouth 2 (two) times daily. Do not start until instructed by coumadin  clinic 60 tablet 11   folic acid  (FOLVITE ) 400 MCG tablet Take 400 mcg by mouth daily.     Glucosamine HCl 1500 MG TABS Take 1,500 mg by mouth every evening.      Melatonin 5 MG CHEW Chew 1 tablet by mouth at bedtime.     metFORMIN  (GLUCOPHAGE -XR) 500 MG 24 hr tablet TAKE 1 TABLET(500 MG) BY MOUTH DAILY WITH BREAKFAST 90 tablet 3   Multiple Vitamin (MULTIVITAMIN WITH MINERALS) TABS Take 1 tablet by mouth  at bedtime.     nitroGLYCERIN  (NITROSTAT ) 0.4 MG SL tablet Place 1 tablet (0.4 mg total) under the tongue every 5 (five) minutes as needed for chest pain. 1 tablet every 5 minutes for 3 dose only 90 tablet 3   omeprazole  (PRILOSEC) 40 MG capsule Take 1 capsule (40 mg total) by mouth daily. 90 capsule 3   simvastatin  (ZOCOR ) 40 MG tablet Take 0.5 tablets (20 mg total) by mouth at bedtime. 45 tablet 2   tamsulosin  (FLOMAX ) 0.4 MG CAPS capsule Take 0.4 mg by mouth at bedtime.     vitamin B-12 (CYANOCOBALAMIN ) 100 MCG tablet Take 100 mcg by mouth daily.     No current facility-administered medications for this encounter.   Allergies:   Penicillins   Social History:  The patient  reports that he has never smoked. He has been exposed to tobacco smoke. He has never used smokeless tobacco. He reports that he does not drink alcohol and does not use drugs.   Family History:   The patient's family history includes Diabetes in his brother; Heart attack (age of onset: 6) in his brother, father, and paternal uncle; Hypertension in his father; Lung cancer in his sister; Nephrolithiasis in his brother; Sudden death (age of onset: 34) in his paternal grandfather.   ROS:  Please see the history of present illness.   All other systems are personally reviewed and negative.   Wt Readings from Last 3 Encounters:  07/07/23 99.4 kg (219 lb 3.2 oz)  06/18/23 98.6 kg (217 lb 6.4 oz)  05/25/23 98.4 kg (217 lb)   BP 136/88   Pulse 86   Ht 5\' 11"  (1.803 m)   Wt 99.4 kg (219 lb 3.2 oz)   SpO2 97%   BMI 30.57 kg/m   PHYSICAL EXAM: General: Well appearing. No distress on RA Cardiac: JVP ~7cm. S1 and S2 present. No murmurs or rub. Resp: Lung sounds clear and equal B/L Abdomen: Soft, non-tender, non-distended.  Extremities: Warm and dry.  No peripheral edema.  Neuro: Alert and oriented x3. Affect pleasant. Moves all extremities without difficulty.  ICD interrogation (personally reviewed): Optivol index below threshold. 1h/day activity. No VT or AF  ReDs reading: 28 %, normal  ASSESSMENT AND PLAN:  1) CAD:  - s/p CABG in 2001 with LIMA to LAD  - Cath 5/19 with intact revascularization and normal filling pressures.  - Continue with mild chronic intermittent CP. Stable, resolves with rest. - Continue statin. No ASA with AC.   2) Chronic systolic HF: ICM - Echo 4/19 EF 30-35% with apical thrombus. [employer] ICD. No shocks - Cath with 5/19 with intact revascularization and normal filling pressures  - EF unchanged in 10/23 and 6/24 at 30-35%.  - NYHA III. Euvolemic on exam, ReDs and Optivol.  - Continue Lasix  40 mg PRN, taking ~3x/week - Off Coreg  due to fatigued  - Off losartan  due to low BP  - Off spiro with low BP and dizziness - No SGLT2i with hx of UTIs  - Now unable to tolerate any GDMT, suspect that this is related to advanced age and disease progression.  3)  HTN - controlled on current regimen  4) HLD - Managed by PCP.  - Continue statin. No change.   5) Apical Thrombus - Coumadin >Eliquis  given recurrent GIB - Eliquis  2.5 mg bid, given age and recent Cr 1.5, most recently 1.37. Fluctuant, keep at 2.5 mg bid for now with hypotension and dizziness. Increase back to 5 mg bid, if  Cr remains below 1.5.  6) h/o GIB - colonoscopy 12/23 with 1 polyp removed, diverticulosis and non-bleeding internal hemorrhoids; no evidence of active GI bleed.  - recurrent GIB 3/25 per above, 2 polyps which were removed Felt to have self-limited diverticular bleed - no s/s bleeding - continue Eliquis  for now  7). Exertional Fatigue/Dyspnea - Iron, Ferritin and TIBC ok - Off GDMT with fatigue, hypotension, and dizziness.  Follow up in 2 months with Dr. Mitzie Anda + echo   [place] Daquisha Clermont, NP  07/07/2023

## 2023-07-07 NOTE — Patient Instructions (Addendum)
 Thank you for coming in today  If you had labs drawn today, any labs that are abnormal the clinic will call you No news is good news  Medications: Stop Spironolactone    Follow up appointments:  Your physician recommends that you schedule a follow-up appointment in:  2 months With Dr. Bensimhon with echocardiogram Please call our office to schedule the follow-up appointment in June for August 2025.  Your physician has requested that you have an echocardiogram. Echocardiography is a painless test that uses sound waves to create images of your heart. It provides your doctor with information about the size and shape of your heart and how well your heart's chambers and valves are working. This procedure takes approximately one hour. There are no restrictions for this procedure.      Do the following things EVERYDAY: Weigh yourself in the morning before breakfast. Write it down and keep it in a log. Take your medicines as prescribed Eat low salt foods--Limit salt (sodium) to 2000 mg per day.  Stay as active as you can everyday Limit all fluids for the day to less than 2 liters   At the [facility], you and your health needs are our priority. As part of our continuing mission to provide you with exceptional heart care, we have created designated Provider Care Teams. These Care Teams include your primary Cardiologist (physician) and Advanced Practice Providers (APPs- Physician Assistants and Nurse Practitioners) who all work together to provide you with the care you need, when you need it.   You may see any of the following providers on your designated Care Team at your next follow up: Dr Jules Oar Dr Peder Bourdon Dr. Mimi Alt, NP Ruddy [facility]ral, [place] [facility] [place], [place] Dennise Fitz, NP Luster Salters, PharmD   Please be sure to bring in all your medications bottles to every appointment.    Thank you for choosing [facility]  If you have any questions or concerns before your next appointment please send us  a message through [place] or call our office at [phone].    TO LEAVE A MESSAGE FOR THE NURSE SELECT OPTION 2, PLEASE LEAVE A MESSAGE INCLUDING: YOUR NAME DATE OF BIRTH CALL BACK NUMBER REASON FOR CALL**this is important as we prioritize the call backs  YOU WILL RECEIVE A CALL BACK THE SAME DAY AS LONG AS YOU CALL BEFORE 4:00 PM

## 2023-07-09 DIAGNOSIS — Z7901 Long term (current) use of anticoagulants: Secondary | ICD-10-CM | POA: Diagnosis not present

## 2023-07-09 DIAGNOSIS — I110 Hypertensive heart disease with heart failure: Secondary | ICD-10-CM | POA: Diagnosis not present

## 2023-07-09 DIAGNOSIS — I4891 Unspecified atrial fibrillation: Secondary | ICD-10-CM | POA: Diagnosis not present

## 2023-07-14 DIAGNOSIS — L570 Actinic keratosis: Secondary | ICD-10-CM | POA: Diagnosis not present

## 2023-07-14 DIAGNOSIS — X32XXXD Exposure to sunlight, subsequent encounter: Secondary | ICD-10-CM | POA: Diagnosis not present

## 2023-07-14 DIAGNOSIS — C44329 Squamous cell carcinoma of skin of other parts of face: Secondary | ICD-10-CM | POA: Diagnosis not present

## 2023-07-16 ENCOUNTER — Other Ambulatory Visit: Payer: Self-pay | Admitting: Family Medicine

## 2023-07-16 NOTE — Addendum Note (Signed)
 Addended by: Edra Govern D on: 07/16/2023 05:01 PM   Modules accepted: Orders

## 2023-07-16 NOTE — Progress Notes (Signed)
 Remote ICD transmission.

## 2023-07-29 ENCOUNTER — Telehealth (HOSPITAL_COMMUNITY): Payer: Self-pay

## 2023-07-29 NOTE — Telephone Encounter (Signed)
 Received a fax requesting medical records from [employer], . Records were successfully faxed to: [phone] ,which was the number provided.. Medical request form will be scanned into patients chart.

## 2023-08-08 ENCOUNTER — Other Ambulatory Visit: Payer: Self-pay | Admitting: Family Medicine

## 2023-08-17 ENCOUNTER — Encounter: Payer: Self-pay | Admitting: Internal Medicine

## 2023-08-24 ENCOUNTER — Ambulatory Visit

## 2023-08-24 DIAGNOSIS — I255 Ischemic cardiomyopathy: Secondary | ICD-10-CM

## 2023-08-25 LAB — CUP PACEART REMOTE DEVICE CHECK
Battery Remaining Longevity: 25 mo
Battery Voltage: 2.93 V
Brady Statistic RV Percent Paced: 0.09 %
Date Time Interrogation Session: 20250715133315
HighPow Impedance: 67 Ohm
Implantable Lead Connection Status: 753985
Implantable Lead Implant Date: 20150422
Implantable Lead Location: 753860
Implantable Lead Model: 6935
Implantable Pulse Generator Implant Date: 20150422
Lead Channel Impedance Value: 399 Ohm
Lead Channel Impedance Value: 475 Ohm
Lead Channel Pacing Threshold Amplitude: 0.625 V
Lead Channel Pacing Threshold Pulse Width: 0.4 ms
Lead Channel Sensing Intrinsic Amplitude: 10.625 mV
Lead Channel Sensing Intrinsic Amplitude: 10.625 mV
Lead Channel Setting Pacing Amplitude: 2 V
Lead Channel Setting Pacing Pulse Width: 0.4 ms
Lead Channel Setting Sensing Sensitivity: 0.3 mV

## 2023-08-26 ENCOUNTER — Ambulatory Visit: Payer: Self-pay | Admitting: Internal Medicine

## 2023-08-27 ENCOUNTER — Ambulatory Visit: Payer: Medicare Other

## 2023-08-27 VITALS — BP 132/80 | HR 93 | Temp 98.7°F | Ht 71.0 in | Wt 219.2 lb

## 2023-08-27 DIAGNOSIS — Z0000 Encounter for general adult medical examination without abnormal findings: Secondary | ICD-10-CM

## 2023-08-27 NOTE — Progress Notes (Signed)
 Subjective:   Joseph Malone is a 89 y.o. who presents for a Medicare Wellness preventive visit.  As a reminder, Annual Wellness Visits don't include a physical exam, and some assessments may be limited, especially if this visit is performed virtually. We may recommend an in-person follow-up visit with your provider if needed.  Visit Complete: In person    Persons Participating in Visit: Patient.  AWV Questionnaire: No: Patient Medicare AWV questionnaire was not completed prior to this visit.  Cardiac Risk Factors include: advanced age (>47men, >22 women);dyslipidemia;hypertension;obesity (BMI >30kg/m2);diabetes mellitus     Objective:    Today's Vitals   08/27/23 1456 08/27/23 1457  BP: 132/80   Pulse: 93   Temp: 98.7 F (37.1 C)   SpO2: 94%   Weight: 219 lb 3.2 oz (99.4 kg)   Height: 5' 11 (1.803 m)   PainSc:  2    Body mass index is 30.57 kg/m.     08/27/2023    3:03 PM 04/17/2023    1:15 PM 04/13/2023    6:50 PM 04/13/2023    7:49 AM 12/15/2022    2:08 PM 12/14/2022    5:57 PM 12/05/2022    1:25 PM  Advanced Directives  Does Patient Have a Medical Advance Directive? No No  No No No No  Would patient like information on creating a medical advance directive? No - Patient declined No - Patient declined No - Patient declined  No - Patient declined No - Patient declined No - Patient declined    Current Medications (verified) Outpatient Encounter Medications as of 08/27/2023  Medication Sig   acetaminophen  (TYLENOL ) 500 MG tablet Take 500-1,000 mg by mouth at bedtime.   apixaban  (ELIQUIS ) 2.5 MG TABS tablet Take 1 tablet (2.5 mg total) by mouth 2 (two) times daily. Do not start until instructed by coumadin  clinic   folic acid  (FOLVITE ) 400 MCG tablet Take 400 mcg by mouth daily.   Glucosamine HCl 1500 MG TABS Take 1,500 mg by mouth every evening.    glucose blood (ONETOUCH ULTRA) test strip USE TO CHECK BLOOD SUGAR ONCE DAILY   Lancets (ONETOUCH DELICA PLUS LANCET30G)  MISC USE TO CHECK BLOOD SUGAR ONCE DAILY   Melatonin 5 MG CHEW Chew 1 tablet by mouth at bedtime.   metFORMIN  (GLUCOPHAGE -XR) 500 MG 24 hr tablet TAKE 1 TABLET(500 MG) BY MOUTH DAILY WITH BREAKFAST   Multiple Vitamin (MULTIVITAMIN WITH MINERALS) TABS Take 1 tablet by mouth at bedtime.   nitroGLYCERIN  (NITROSTAT ) 0.4 MG SL tablet Place 1 tablet (0.4 mg total) under the tongue every 5 (five) minutes as needed for chest pain. 1 tablet every 5 minutes for 3 dose only   omeprazole  (PRILOSEC) 40 MG capsule Take 1 capsule (40 mg total) by mouth daily.   simvastatin  (ZOCOR ) 40 MG tablet Take 0.5 tablets (20 mg total) by mouth at bedtime.   tamsulosin  (FLOMAX ) 0.4 MG CAPS capsule Take 0.4 mg by mouth at bedtime.   vitamin B-12 (CYANOCOBALAMIN ) 100 MCG tablet Take 100 mcg by mouth daily.   No facility-administered encounter medications on file as of 08/27/2023.    Allergies (verified) Penicillins   History: Past Medical History:  Diagnosis Date   Anxiety    BPH (benign prostatic hypertrophy)    CAD (coronary artery disease)    s/p CABG   CHF (congestive heart failure) (HCC)    Diabetes 1.5, managed as type 2 (HCC)    Diverticulosis    Diverticulosis of colon without hemorrhage 10/22/2007  Qualifier: Diagnosis of  By: Tish MD, Elsie     DOE (dyspnea on exertion)    Excess weight    Fasting hyperglycemia    Generalized weakness    GERD (gastroesophageal reflux disease)    Heart attack (HCC) 2001   Heart failure    CHF due to ischemic CM   History of kidney stones    Hyperlipidemia    Hypertension    Ischemic cardiomyopathy    Microscopic hematuria    [facility]   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   Past Surgical History:  Procedure Laterality Date   APPENDECTOMY     BIOPSY  10/21/2018   Procedure: BIOPSY;  Surgeon: Leigh Elspeth SQUIBB, MD;  Location: [facility];  Service: Gastroenterology;;   BIOPSY  12/15/2022   Procedure: BIOPSY;  Surgeon: Abran Norleen SAILOR,  MD;  Location: [facility];  Service: Gastroenterology;;   CARDIAC CATHETERIZATION  2000   CARDIAC DEFIBRILLATOR PLACEMENT  2005   [employer]; s/p ICD gen change and RV lead revision April 2015   CATARACT EXTRACTION  2008   bilateral with lens implant   COLONOSCOPY  2004   Tics; [facility]   COLONOSCOPY N/A 04/17/2023   Procedure: COLONOSCOPY;  Surgeon: Abran Norleen SAILOR, MD;  Location: [facility];  Service: Gastroenterology;  Laterality: N/A;   COLONOSCOPY WITH PROPOFOL  N/A 01/16/2022   Procedure: COLONOSCOPY WITH PROPOFOL ;  Surgeon: Shila Gustav GAILS, MD;  Location: [facility];  Service: Gastroenterology;  Laterality: N/A;   CORONARY ARTERY BYPASS GRAFT  2000   1 vessel   CYSTOSCOPY  06/2012   Dr Eliza   ENTEROSCOPY N/A 04/16/2023   Procedure: ENTEROSCOPY;  Surgeon: Avram Lupita BRAVO, MD;  Location: [facility];  Service: Gastroenterology;  Laterality: N/A;   ESOPHAGOGASTRODUODENOSCOPY (EGD) WITH PROPOFOL  N/A 10/21/2018   Procedure: ESOPHAGOGASTRODUODENOSCOPY (EGD) WITH PROPOFOL ;  Surgeon: Leigh Elspeth SQUIBB, MD;  Location: [facility];  Service: Gastroenterology;  Laterality: N/A;   ESOPHAGOGASTRODUODENOSCOPY (EGD) WITH PROPOFOL  N/A 12/15/2022   Procedure: ESOPHAGOGASTRODUODENOSCOPY (EGD) WITH PROPOFOL ;  Surgeon: Abran Norleen SAILOR, MD;  Location: [facility];  Service: Gastroenterology;  Laterality: N/A;   FLEXIBLE SIGMOIDOSCOPY N/A 04/16/2023   Procedure: FLEXIBLE SIGMOIDOSCOPY;  Surgeon: Avram Lupita BRAVO, MD;  Location: [facility];  Service: Gastroenterology;  Laterality: N/A;   HOT HEMOSTASIS N/A 10/21/2018   Procedure: HOT HEMOSTASIS (ARGON PLASMA COAGULATION/BICAP);  Surgeon: Leigh Elspeth SQUIBB, MD;  Location: [facility];  Service: Gastroenterology;  Laterality: N/A;   IMPLANTABLE CARDIOVERTER DEFIBRILLATOR (ICD) GENERATOR CHANGE N/A 06/01/2013   Procedure: ICD GENERATOR CHANGE;  Surgeon: Danelle LELON Birmingham, MD;  Location[facility] C[facility] LAB;  Service: Cardiovascular;  Laterality: N/A;   LEAD  REVISION N/A 06/01/2013   Procedure: LEAD REVISION;  Surgeon: Danelle LELON Birmingham, MD;  Location: [facility] CATH LAB;  Service: Cardiovascular;  Laterality: N/A;   PILONIDAL CYST EXCISION     POLYPECTOMY  01/16/2022   Procedure: POLYPECTOMY;  Surgeon: Shila Gustav GAILS, MD;  Location: [facility];  Service: Gastroenterology;;   POLYPECTOMY  04/17/2023   Procedure: POLYPECTOMY;  Surgeon: Abran Norleen SAILOR, MD;  Location: [facility];  Service: Gastroenterology;;   RIGHT/LEFT HEART CATH AND CORONARY/GRAFT ANGIOGRAPHY N/A 06/12/2017   Procedure: RIGHT/LEFT HEART CATH AND CORONARY/GRAFT ANGIOGRAPHY;  Surgeon: Cherrie Toribio SAUNDERS, MD;  Location: [facility];  Service: Cardiovascular;  Laterality: N/A;   TRANSTHORACIC ECHOCARDIOGRAM  08/29/2005   Family History  Problem Relation Age of Onset   Hypertension Father    Heart attack Father 22  Diabetes Brother        X 2   Nephrolithiasis Brother    Heart attack Brother 41   Heart attack Paternal Uncle 25   Sudden death Paternal Grandfather 65       ? heat stroke   Lung cancer Sister        Asian lung cancer   Stroke Neg Hx    Colon cancer Neg Hx    Esophageal cancer Neg Hx    Rectal cancer Neg Hx    Stomach cancer Neg Hx    Social History   Socioeconomic History   Marital status: Married    Spouse name: Not on file   Number of children: 4   Years of education: Not on file   Highest education level: Not on file  Occupational History   Occupation: Retired  Tobacco Use   Smoking status: Never    Passive exposure: Yes   Smokeless tobacco: Never  Vaping Use   Vaping status: Never Used  Substance and Sexual Activity   Alcohol use: No   Drug use: No   Sexual activity: Never  Other Topics Concern   Not on file  Social History Narrative   Married 1957. 4 children 2 boys 2 girls. 15 grandkids.  4 greatgrandkids.       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       Hobbies: genealogy, travel       No HCPOA-advised to do this.    Social Drivers of Corporate investment banker Strain: Low Risk  (08/27/2023)   Overall Financial Resource Strain (CARDIA)    Difficulty of Paying Living Expenses: Not hard at all  Food Insecurity: No Food Insecurity (08/27/2023)   Hunger Vital Sign    Worried About Running Out of Food in the Last Year: Never true    Ran Out of Food in the Last Year: Never true  Transportation Needs: No Transportation Needs (08/27/2023)   PRAPARE - Administrator, Civil Service (Medical): No    Lack of Transportation (Non-Medical): No  Physical Activity: Insufficiently Active (08/27/2023)   Exercise Vital Sign    Days of Exercise per Week: 3 days    Minutes of Exercise per Session: 30 min  Stress: No Stress Concern Present (08/27/2023)   [employer] of Occupational Health - Occupational Stress Questionnaire    Feeling of Stress: Not at all  Social Connections: Socially Integrated (08/27/2023)   Social Connection and Isolation Panel    Frequency of Communication with Friends and Family: More than three times a week    Frequency of Social Gatherings with Friends and Family: More than three times a week    Attends Religious Services: More than 4 times per year    Active Member of [employer] or Organizations: Yes    Attends Banker Meetings: 1 to 4 times per year    Marital Status: Married    Tobacco Counseling Counseling given: Not Answered    Clinical Intake:  Pre-visit preparation completed: Yes  Pain : 0-10 Pain Score: 2  Pain Type: Chronic pain Pain Location: Chest Pain Descriptors / Indicators: Pressure, Dull Pain Onset: More than a month ago Pain Frequency: Intermittent     BMI - recorded: 30.57 Nutritional Status: BMI > 30  Obese Nutritional Risks: None Diabetes: Yes CBG done?: Yes (112 per pt) CBG resulted in Enter/ Edit results?: No Did pt. bring in CBG monitor from home?: No  Lab Results  Component Value Date    HGBA1C 6.1 06/18/2023   HGBA1C 6.4 02/17/2023   HGBA1C 6.1 10/16/2022     How often do you need to have someone help you when you read instructions, pamphlets, or other written materials from your doctor or pharmacy?: 1 - Never  Interpreter Needed?: No  Information entered by :: Ellouise Haws, LPN   Activities of Daily Living     08/27/2023    3:00 PM 04/13/2023   10:30 PM  In your present state of health, do you have any difficulty performing the following activities:  Hearing? 1   Comment hearing aids at times   Vision? 0   Difficulty concentrating or making decisions? 0   Walking or climbing stairs? 1   Comment SOB   Dressing or bathing? 0   Doing errands, shopping? 0 0  Preparing Food and eating ? N   Using the Toilet? N   In the past six months, have you accidently leaked urine? Y   Comment at times wears a pad   Do you have problems with loss of bowel control? N   Managing your Medications? N   Managing your Finances? N   Housekeeping or managing your Housekeeping? N     Patient Care Team: Katrinka Garnette KIDD, MD as PCP - General (Family Medicine) Waddell Danelle ORN, MD as PCP - Electrophysiology (Cardiology) Bensimhon, Toribio SAUNDERS, MD as PCP - Advanced Heart Failure (Cardiology) Nieves Cough, MD as Consulting Physician (Urology) Shona Rush, MD (Dermatology) Nicholaus Sherlean CROME, [facility] (Inactive) as Pharmacist (Pharmacist)  I have updated your Care Teams any recent Medical Services you may have received from other providers in the past year.     Assessment:   This is a routine wellness examination for [place].  Hearing/Vision screen Hearing Screening - Comments:: HOH hearing aids  Vision Screening - Comments:: Wears rx glasses - up to date with routine eye exams with Dr Vicci    Goals Addressed   None    Depression Screen     08/27/2023    3:04 PM 06/18/2023    2:49 PM 10/16/2022    2:25 PM 08/21/2022    3:41 PM 09/06/2021   10:07 AM 11/23/2020    3:00 PM  09/03/2020    1:12 PM  PHQ 2/9 Scores  PHQ - 2 Score 0 0 2 0 0 0 0  PHQ- 9 Score   4        Fall Risk     08/27/2023    3:07 PM 06/18/2023    2:49 PM 08/21/2022    3:45 PM 09/06/2021   10:10 AM 11/23/2020    3:00 PM  Fall Risk   Falls in the past year? 0 0 1 0 0  Number falls in past yr: 0 0 1 0 0  Injury with Fall? 0 0 0 0 0  Comment   slid off bed    Risk for fall due to : Impaired mobility;Impaired balance/gait No Fall Risks Impaired vision;Impaired balance/gait Impaired vision;Impaired balance/gait No Fall Risks  Follow up Falls prevention discussed  Falls prevention discussed Falls prevention discussed  Falls evaluation completed      Data saved with a previous flowsheet row definition    MEDICARE RISK AT HOME:  Medicare Risk at Home Any stairs in or around the home?: No If so, are there any without handrails?: No Home free of loose throw rugs in walkways, pet beds, electrical cords, etc?: Yes Adequate lighting  in your home to reduce risk of falls?: Yes Life alert?: Yes Use of a cane, walker or w/c?: No Grab bars in the bathroom?: Yes Shower chair or bench in shower?: Yes Elevated toilet seat or a handicapped toilet?: Yes  TIMED UP AND GO:  Was the test performed?  Yes  Length of time to ambulate 10 feet: 10 sec Gait steady and fast without use of assistive device  Cognitive Function: 6CIT completed        08/27/2023    3:08 PM 08/21/2022    3:47 PM 09/06/2021   10:12 AM 09/03/2020    1:21 PM 08/29/2019    1:28 PM  6CIT Screen  What Year? 0 points 0 points 0 points 0 points 0 points  What month? 0 points 0 points 0 points 0 points 0 points  What time? 0 points 0 points 0 points 0 points 0 points  Count back from 20 0 points 0 points 0 points 0 points 0 points  Months in reverse 0 points 0 points 0 points 0 points 0 points  Repeat phrase 0 points 0 points 2 points 2 points 4 points  Total Score 0 points 0 points 2 points 2 points 4 points     Immunizations Immunization History  Administered Date(s) Administered   Fluad Quad(high Dose 65+) 01/20/2020, 11/01/2021   Influenza, High Dose Seasonal PF 10/12/2018, 10/24/2020, 10/15/2022   Influenza,inj,Quad PF,6+ Mos 11/11/2012   Influenza-Unspecified 10/25/2013, 10/27/2014, 10/24/2015, 10/19/2017   PFIZER Comirnaty(Gray Top)Covid-19 Tri-Sucrose Vaccine 10/15/2022   PFIZER(Purple Top)SARS-COV-2 Vaccination 03/06/2019, 03/24/2019, 01/20/2020, 06/27/2020   PNEUMOCOCCAL CONJUGATE-20 09/06/2020   Pfizer Covid-19 Vaccine Bivalent Booster 104yrs & up 12/10/2020, 06/05/2021   Pneumococcal Conjugate-13 08/21/2014   Pneumococcal Polysaccharide-23 10/02/2009   Td 09/17/2009, 10/31/2019   Zoster Recombinant(Shingrix) 06/27/2020, 08/27/2020   Zoster, Live 12/29/2012    Screening Tests Health Maintenance  Topic Date Due   COVID-19 Vaccine (8 - 2024-25 season) 12/10/2022   INFLUENZA VACCINE  09/11/2023   HEMOGLOBIN A1C  12/19/2023   FOOT EXAM  02/17/2024   OPHTHALMOLOGY EXAM  06/28/2024   Medicare Annual Wellness (AWV)  08/26/2024   DTaP/Tdap/Td (3 - Tdap) 10/30/2029   Pneumococcal Vaccine: 50+ Years  Completed   Zoster Vaccines- Shingrix  Completed   Hepatitis B Vaccines  Aged Out   HPV VACCINES  Aged Out   Meningococcal B Vaccine  Aged Out    Health Maintenance  Health Maintenance Due  Topic Date Due   COVID-19 Vaccine (8 - 2024-25 season) 12/10/2022   Health Maintenance Items Addressed: See Nurse Notes at the end of this note  Additional Screening:  Vision Screening: Recommended annual ophthalmology exams for early detection of glaucoma and other disorders of the eye. Would you like a referral to an eye doctor? No    Dental Screening: Recommended annual dental exams for proper oral hygiene  Community Resource Referral / Chronic Care Management: CRR required this visit?  No   CCM required this visit?  No   Plan:    I have personally reviewed and noted the  following in the patient's chart:   Medical and social history Use of alcohol, tobacco or illicit drugs  Current medications and supplements including opioid prescriptions. Patient is not currently taking opioid prescriptions. Functional ability and status Nutritional status Physical activity Advanced directives List of other physicians Hospitalizations, surgeries, and ER visits in previous 12 months Vitals Screenings to include cognitive, depression, and falls Referrals and appointments  In addition, I have reviewed and  discussed with patient certain preventive protocols, quality metrics, and best practice recommendations. A written personalized care plan for preventive services as well as general preventive health recommendations were provided to patient.   Ellouise VEAR Haws, LPN   2/82/7974   After Visit Summary: (In Person-Printed) AVS printed and given to the patient  Notes: Nothing significant to report at this time.

## 2023-08-27 NOTE — Patient Instructions (Addendum)
 [name] , Thank you for taking time out of your busy schedule to complete your Annual Wellness Visit with me. I enjoyed our conversation and look forward to speaking with you again next year. I, as well as your care team,  appreciate your ongoing commitment to your health goals. Please review the following plan we discussed and let me know if I can assist you in the future. Your Game plan/ To Do List    Referrals: If you haven't heard from the office you've been referred to, please reach out to them at the phone provided.   Follow up Visits: Next Medicare AWV with our clinical staff: 09/05/24   Have you seen your provider in the last 6 months (3 months if uncontrolled diabetes)? Yes Next Office Visit with your provider: 11/24/23  Clinician Recommendations:  Aim for 30 minutes of exercise or brisk walking, 6-8 glasses of water, and 5 servings of fruits and vegetables each day.        This is a list of the screening recommended for you and due dates:  Health Maintenance  Topic Date Due   COVID-19 Vaccine (8 - 2024-25 season) 12/10/2022   Medicare Annual Wellness Visit  08/21/2023   Flu Shot  09/11/2023   Hemoglobin A1C  12/19/2023   Complete foot exam   02/17/2024   Eye exam for diabetics  06/28/2024   DTaP/Tdap/Td vaccine (3 - Tdap) 10/30/2029   Pneumococcal Vaccine for age over 57  Completed   Zoster (Shingles) Vaccine  Completed   Hepatitis B Vaccine  Aged Out   HPV Vaccine  Aged Out   Meningitis B Vaccine  Aged Out    Advanced directives: (Declined) Advance directive discussed with you today. Even though you declined this today, please call our office should you change your mind, and we can give you the proper paperwork for you to fill out. Advance Care Planning is important because it:  [x]  Makes sure you receive the medical care that is consistent with your values, goals, and preferences  [x]  It provides guidance to your family and loved ones and reduces their decisional  burden about whether or not they are making the right decisions based on your wishes.  Follow the link provided in your after visit summary or read over the paperwork we have mailed to you to help you started getting your Advance Directives in place. If you need assistance in completing these, please reach out to us  so that we can help you!  See attachments for Preventive Care and Fall Prevention Tips.

## 2023-08-31 ENCOUNTER — Telehealth (HOSPITAL_COMMUNITY): Payer: Self-pay

## 2023-09-04 ENCOUNTER — Encounter: Payer: Self-pay | Admitting: Pharmacist

## 2023-09-04 ENCOUNTER — Ambulatory Visit (HOSPITAL_COMMUNITY)
Admission: RE | Admit: 2023-09-04 | Discharge: 2023-09-04 | Disposition: A | Discharge status: Home / Self Care | Source: Ambulatory Visit | Attending: Internal Medicine | Admitting: Internal Medicine

## 2023-09-04 DIAGNOSIS — I3481 Nonrheumatic mitral (valve) annulus calcification: Secondary | ICD-10-CM | POA: Insufficient documentation

## 2023-09-04 DIAGNOSIS — I5022 Chronic systolic (congestive) heart failure: Secondary | ICD-10-CM | POA: Insufficient documentation

## 2023-09-04 DIAGNOSIS — I110 Hypertensive heart disease with heart failure: Secondary | ICD-10-CM | POA: Diagnosis not present

## 2023-09-04 DIAGNOSIS — I358 Other nonrheumatic aortic valve disorders: Secondary | ICD-10-CM | POA: Diagnosis not present

## 2023-09-04 LAB — ECHOCARDIOGRAM COMPLETE
Area-P 1/2: 4.15 cm2
S' Lateral: 3.43 cm

## 2023-09-04 MED ORDER — PERFLUTREN LIPID MICROSPHERE
1.0000 mL | INTRAVENOUS | Status: AC | PRN
  Administered 2023-09-04: 3 mL via INTRAVENOUS

## 2023-09-04 NOTE — Progress Notes (Signed)
 Pharmacy Quality Measure Review  This patient is appearing on a report for being at risk of failing the adherence measure for diabetes medications this calendar year.   Medication: metformin  XR 500mg  Last fill date: 04/06/2023 for 90 day supply  Reviewed recent refill history in Dr Annemarie database. Actual last refill date was 07/18/2023 for 90 day supply. Patient has 3 refills remaining. Next appointment with PCP is 11/24/2023.    ALso reviewed simvastatin  refill - has filled for 90 days supply on 04/11/2023 and 07/02/2023 - has 1 refill remaining.  Insurance report was not up to date. No action needed at this time.   Madelin Ray, PharmD Clinical Pharmacist [facility] Primary [facility]  Population Health [phone]

## 2023-09-08 DIAGNOSIS — C44319 Basal cell carcinoma of skin of other parts of face: Secondary | ICD-10-CM | POA: Diagnosis not present

## 2023-09-08 DIAGNOSIS — Z85828 Personal history of other malignant neoplasm of skin: Secondary | ICD-10-CM | POA: Diagnosis not present

## 2023-09-08 DIAGNOSIS — Z08 Encounter for follow-up examination after completed treatment for malignant neoplasm: Secondary | ICD-10-CM | POA: Diagnosis not present

## 2023-09-14 ENCOUNTER — Telehealth (HOSPITAL_COMMUNITY): Payer: Self-pay

## 2023-09-14 NOTE — Telephone Encounter (Signed)
 Received a fax requesting medical records from [facility] Records were successfully faxed to: [phone] ,which was the number provided.. Medical request form will be scanned into patients chart.

## 2023-09-15 ENCOUNTER — Other Ambulatory Visit: Payer: Self-pay | Admitting: Family Medicine

## 2023-10-19 DIAGNOSIS — R3915 Urgency of urination: Secondary | ICD-10-CM | POA: Diagnosis not present

## 2023-10-19 DIAGNOSIS — N401 Enlarged prostate with lower urinary tract symptoms: Secondary | ICD-10-CM | POA: Diagnosis not present

## 2023-10-19 DIAGNOSIS — N281 Cyst of kidney, acquired: Secondary | ICD-10-CM | POA: Diagnosis not present

## 2023-11-02 ENCOUNTER — Telehealth: Payer: Self-pay

## 2023-11-02 NOTE — Telephone Encounter (Signed)
 Copied from CRM [id number]. Topic: Clinical - Medical Advice >> Nov 02, 2023  9:36 AM Macario HERO wrote: Reason for CRM: Patient called said he wanted to know if he should take the COVID vaccine?

## 2023-11-02 NOTE — Telephone Encounter (Signed)
 Yes thanks-we discussed this at his wife's visit but I think it is reasonable for him to take it

## 2023-11-03 DIAGNOSIS — E1151 Type 2 diabetes mellitus with diabetic peripheral angiopathy without gangrene: Secondary | ICD-10-CM | POA: Diagnosis not present

## 2023-11-03 DIAGNOSIS — I739 Peripheral vascular disease, unspecified: Secondary | ICD-10-CM | POA: Diagnosis not present

## 2023-11-03 DIAGNOSIS — M19071 Primary osteoarthritis, right ankle and foot: Secondary | ICD-10-CM | POA: Diagnosis not present

## 2023-11-19 NOTE — Progress Notes (Signed)
 Remote ICD Transmission

## 2023-11-23 ENCOUNTER — Ambulatory Visit

## 2023-11-23 DIAGNOSIS — I5022 Chronic systolic (congestive) heart failure: Secondary | ICD-10-CM

## 2023-11-23 LAB — CUP PACEART REMOTE DEVICE CHECK
Battery Remaining Longevity: 24 mo
Battery Voltage: 2.93 V
Brady Statistic RV Percent Paced: 0.08 %
Date Time Interrogation Session: 20251013033525
HighPow Impedance: 75 Ohm
Implantable Lead Connection Status: 753985
Implantable Lead Implant Date: 20150422
Implantable Lead Location: 753860
Implantable Lead Model: 6935
Implantable Pulse Generator Implant Date: 20150422
Lead Channel Impedance Value: 399 Ohm
Lead Channel Impedance Value: 456 Ohm
Lead Channel Pacing Threshold Amplitude: 0.5 V
Lead Channel Pacing Threshold Pulse Width: 0.4 ms
Lead Channel Sensing Intrinsic Amplitude: 13.625 mV
Lead Channel Sensing Intrinsic Amplitude: 13.625 mV
Lead Channel Setting Pacing Amplitude: 2 V
Lead Channel Setting Pacing Pulse Width: 0.4 ms
Lead Channel Setting Sensing Sensitivity: 0.3 mV

## 2023-11-24 ENCOUNTER — Ambulatory Visit: Admitting: Family Medicine

## 2023-11-24 ENCOUNTER — Encounter: Payer: Self-pay | Admitting: Family Medicine

## 2023-11-24 ENCOUNTER — Ambulatory Visit: Payer: Self-pay | Admitting: Family Medicine

## 2023-11-24 VITALS — BP 102/60 | HR 78 | Temp 98.2°F | Ht 71.0 in | Wt 214.0 lb

## 2023-11-24 DIAGNOSIS — Z0000 Encounter for general adult medical examination without abnormal findings: Secondary | ICD-10-CM

## 2023-11-24 DIAGNOSIS — Z7984 Long term (current) use of oral hypoglycemic drugs: Secondary | ICD-10-CM | POA: Diagnosis not present

## 2023-11-24 DIAGNOSIS — E119 Type 2 diabetes mellitus without complications: Secondary | ICD-10-CM | POA: Diagnosis not present

## 2023-11-24 DIAGNOSIS — R7989 Other specified abnormal findings of blood chemistry: Secondary | ICD-10-CM

## 2023-11-24 LAB — HEMOGLOBIN A1C: Hgb A1c MFr Bld: 6.6 % — ABNORMAL HIGH (ref 4.6–6.5)

## 2023-11-24 LAB — CBC WITH DIFFERENTIAL/PLATELET
Basophils Absolute: 0.1 K/uL (ref 0.0–0.1)
Basophils Relative: 0.7 % (ref 0.0–3.0)
Eosinophils Absolute: 0.1 K/uL (ref 0.0–0.7)
Eosinophils Relative: 1.8 % (ref 0.0–5.0)
HCT: 42.6 % (ref 39.0–52.0)
Hemoglobin: 14.4 g/dL (ref 13.0–17.0)
Lymphocytes Relative: 30.5 % (ref 12.0–46.0)
Lymphs Abs: 2.4 K/uL (ref 0.7–4.0)
MCHC: 33.7 g/dL (ref 30.0–36.0)
MCV: 92 fl (ref 78.0–100.0)
Monocytes Absolute: 0.6 K/uL (ref 0.1–1.0)
Monocytes Relative: 7.1 % (ref 3.0–12.0)
Neutro Abs: 4.7 K/uL (ref 1.4–7.7)
Neutrophils Relative %: 59.9 % (ref 43.0–77.0)
Platelets: 182 K/uL (ref 150.0–400.0)
RBC: 4.63 Mil/uL (ref 4.22–5.81)
RDW: 13.1 % (ref 11.5–15.5)
WBC: 7.9 K/uL (ref 4.0–10.5)

## 2023-11-24 NOTE — Patient Instructions (Addendum)
 Please stop by lab before you go If you have mychart- we will send your results within 3 business days of us  receiving them.  If you do not have mychart- we will call you about results within 5 business days of us  receiving them.  *please also note that you will see labs on mychart as soon as they post. I will later go in and write notes on them- will say notes from Dr. Katrinka   No changes today unless labs lead us  to make changes   Recommended follow up: Return in about 6 months (around 05/24/2024) for followup or sooner if needed.Schedule b4 you leave.

## 2023-11-24 NOTE — Progress Notes (Signed)
 Phone: [phone]   Subjective:  Patient presents today for their annual physical. Chief complaint-noted.   See problem oriented charting- ROS- full  review of systems was completed and negative  except for topics noted under acute/chronic concerns  The following were reviewed and entered/updated in epic: Past Medical History:  Diagnosis Date   Anxiety    BPH (benign prostatic hypertrophy)    CAD (coronary artery disease)    s/p CABG   CHF (congestive heart failure) (HCC)    Diabetes 1.5, managed as type 2 (HCC)    Diverticulosis    Diverticulosis of colon without hemorrhage 10/22/2007   Qualifier: Diagnosis of  By: Tish MD, Elsie     DOE (dyspnea on exertion)    Excess weight    Fasting hyperglycemia    Generalized weakness    GERD (gastroesophageal reflux disease)    Heart attack (HCC) 2001   Heart failure    CHF due to ischemic CM   History of kidney stones    Hyperlipidemia    Hypertension    Ischemic cardiomyopathy    Microscopic hematuria    [facility]   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   Patient Active Problem List   Diagnosis Date Noted   Multiple gastric ulcers 12/15/2022    Priority: High   GI bleeding 01/13/2022    Priority: High   Left ventricular thrombus 01/13/2022    Priority: High   Chronic systolic heart failure (HCC) 12/09/2010    Priority: High   Ischemic cardiomyopathy 10/02/2009    Priority: High   ICD (implantable cardioverter-defibrillator) in place 01/16/2009    Priority: High   Non-insulin  dependent type 2 diabetes mellitus (HCC) 09/14/2008    Priority: High   CAD in native artery 09/25/2006    Priority: High   Esophageal stricture 12/15/2022    Priority: Medium    CKD stage 3b, GFR 30-44 ml/min (HCC) 12/05/2022    Priority: Medium    Atherosclerosis of aorta 05/18/2020    Priority: Medium    Nephrolithiasis 03/24/2016    Priority: Medium    BPH (benign prostatic hyperplasia) 10/22/2007    Priority:  Medium    Hyperlipidemia associated with type 2 diabetes mellitus (HCC) 04/22/2007    Priority: Medium    Essential hypertension 09/25/2006    Priority: Medium    COVID-19 virus infection 12/07/2022    Priority: Low   Polyp of ascending colon 01/16/2022    Priority: Low   PVC's (premature ventricular contractions) 01/24/2020    Priority: Low   Erectile dysfunction 01/19/2014    Priority: Low   Malignant basal cell neoplasm of skin 09/29/2012    Priority: Low   Solitary pulmonary nodule 07/12/2012    Priority: Low   Osteoarthritis 09/25/2006    Priority: Low   Hematuria 09/25/2006    Priority: Low   Adenomatous polyp of sigmoid colon 04/17/2023   Diverticulosis of colon with hemorrhage 04/17/2023   Polyp of cecum 04/17/2023   GI bleed 04/14/2023   Hematochezia 04/13/2023   Duodenal ulcer 12/15/2022   Past Surgical History:  Procedure Laterality Date   APPENDECTOMY     BIOPSY  10/21/2018   Procedure: BIOPSY;  Surgeon: Leigh Elspeth SQUIBB, MD;  Location: [facility];  Service: Gastroenterology;;   BIOPSY  12/15/2022   Procedure: BIOPSY;  Surgeon: Abran Norleen SAILOR, MD;  Location: [facility];  Service: Gastroenterology;;   CARDIAC CATHETERIZATION  2000   CARDIAC DEFIBRILLATOR PLACEMENT  2005   Medtronic; s/p  ICD gen change and RV lead revision April 2015   CATARACT EXTRACTION  2008   bilateral with lens implant   COLONOSCOPY  2004   Tics; [facility]   COLONOSCOPY N/A 04/17/2023   Procedure: COLONOSCOPY;  Surgeon: Abran Norleen SAILOR, MD;  Location: [facility];  Service: Gastroenterology;  Laterality: N/A;   COLONOSCOPY WITH PROPOFOL  N/A 01/16/2022   Procedure: COLONOSCOPY WITH PROPOFOL ;  Surgeon: Shila Gustav GAILS, MD;  Location: [facility];  Service: Gastroenterology;  Laterality: N/A;   CORONARY ARTERY BYPASS GRAFT  2000   1 vessel   CYSTOSCOPY  06/2012   Dr Eliza   ENTEROSCOPY N/A 04/16/2023   Procedure: ENTEROSCOPY;  Surgeon: Avram Lupita BRAVO, MD;  Location: [facility];   Service: Gastroenterology;  Laterality: N/A;   ESOPHAGOGASTRODUODENOSCOPY (EGD) WITH PROPOFOL  N/A 10/21/2018   Procedure: ESOPHAGOGASTRODUODENOSCOPY (EGD) WITH PROPOFOL ;  Surgeon: Leigh Elspeth SQUIBB, MD;  Location: [facility];  Service: Gastroenterology;  Laterality: N/A;   ESOPHAGOGASTRODUODENOSCOPY (EGD) WITH PROPOFOL  N/A 12/15/2022   Procedure: ESOPHAGOGASTRODUODENOSCOPY (EGD) WITH PROPOFOL ;  Surgeon: Abran Norleen SAILOR, MD;  Location: [facility];  Service: Gastroenterology;  Laterality: N/A;   FLEXIBLE SIGMOIDOSCOPY N/A 04/16/2023   Procedure: FLEXIBLE SIGMOIDOSCOPY;  Surgeon: Avram Lupita BRAVO, MD;  Location: [facility];  Service: Gastroenterology;  Laterality: N/A;   HOT HEMOSTASIS N/A 10/21/2018   Procedure: HOT HEMOSTASIS (ARGON PLASMA COAGULATION/BICAP);  Surgeon: Leigh Elspeth SQUIBB, MD;  Location: [facility];  Service: Gastroenterology;  Laterality: N/A;   IMPLANTABLE CARDIOVERTER DEFIBRILLATOR (ICD) GENERATOR CHANGE N/A 06/01/2013   Procedure: ICD GENERATOR CHANGE;  Surgeon: Danelle LELON Birmingham, MD;  Location: [facility] CATH LAB;  Service: Cardiovascular;  Laterality: N/A;   LEAD REVISION N/A 06/01/2013   Procedure: LEAD REVISION;  Surgeon: Danelle LELON Birmingham, MD;  Location: [facility] CATH LAB;  Service: Cardiovascular;  Laterality: N/A;   PILONIDAL CYST EXCISION     POLYPECTOMY  01/16/2022   Procedure: POLYPECTOMY;  Surgeon: Shila Gustav GAILS, MD;  Location: [facility];  Service: Gastroenterology;;   POLYPECTOMY  04/17/2023   Procedure: POLYPECTOMY;  Surgeon: Abran Norleen SAILOR, MD;  Location: [facility];  Service: Gastroenterology;;   RIGHT/LEFT HEART CATH AND CORONARY/GRAFT ANGIOGRAPHY N/A 06/12/2017   Procedure: RIGHT/LEFT HEART CATH AND CORONARY/GRAFT ANGIOGRAPHY;  Surgeon: Cherrie Toribio SAUNDERS, MD;  Location: [facility];  Service: Cardiovascular;  Laterality: N/A;   TRANSTHORACIC ECHOCARDIOGRAM  08/29/2005    Family History  Problem Relation Age of Onset   Hypertension Father    Heart attack  Father 13   Diabetes Brother        X 2   Nephrolithiasis Brother    Heart attack Brother 78   Heart attack Paternal Uncle 64   Sudden death Paternal Grandfather 99       ? heat stroke   Lung cancer Sister        Asian lung cancer   Stroke Neg Hx    Colon cancer Neg Hx    Esophageal cancer Neg Hx    Rectal cancer Neg Hx    Stomach cancer Neg Hx     Medications- reviewed and updated Current Outpatient Medications  Medication Sig Dispense Refill   acetaminophen  (TYLENOL ) 500 MG tablet Take 500-1,000 mg by mouth at bedtime.     apixaban  (ELIQUIS ) 2.5 MG TABS tablet Take 1 tablet (2.5 mg total) by mouth 2 (two) times daily. Do not start until instructed by coumadin  clinic 60 tablet 11   finasteride (PROSCAR) 5 MG tablet Take 5 mg by mouth at  bedtime.     folic acid  (FOLVITE ) 400 MCG tablet Take 400 mcg by mouth daily.     Glucosamine HCl 1500 MG TABS Take 1,500 mg by mouth every evening.      glucose blood (ONETOUCH ULTRA) test strip USE TO CHECK BLOOD SUGAR ONCE DAILY 100 strip 3   Lancets (ONETOUCH DELICA PLUS LANCET30G) MISC USE TO CHECK BLOOD SUGAR ONCE DAILY 100 each 3   Melatonin 5 MG CHEW Chew 1 tablet by mouth at bedtime.     metFORMIN  (GLUCOPHAGE -XR) 500 MG 24 hr tablet TAKE 1 TABLET(500 MG) BY MOUTH DAILY WITH BREAKFAST 90 tablet 3   Multiple Vitamin (MULTIVITAMIN WITH MINERALS) TABS Take 1 tablet by mouth at bedtime.     nitroGLYCERIN  (NITROSTAT ) 0.4 MG SL tablet Place 1 tablet (0.4 mg total) under the tongue every 5 (five) minutes as needed for chest pain. 1 tablet every 5 minutes for 3 dose only 90 tablet 3   omeprazole  (PRILOSEC) 40 MG capsule Take 1 capsule (40 mg total) by mouth daily. 90 capsule 3   simvastatin  (ZOCOR ) 40 MG tablet Take 0.5 tablets (20 mg total) by mouth at bedtime. 45 tablet 2   tamsulosin  (FLOMAX ) 0.4 MG CAPS capsule Take 0.4 mg by mouth at bedtime.     tiZANidine  (ZANAFLEX ) 2 MG tablet TAKE 1 TABLET(2 MG) BY MOUTH EVERY 6 HOURS AS NEEDED FOR MUSCLE  SPASMS 90 tablet 0   vitamin B-12 (CYANOCOBALAMIN ) 100 MCG tablet Take 100 mcg by mouth daily.     No current facility-administered medications for this visit.    Allergies-reviewed and updated Allergies  Allergen Reactions   Penicillins Rash and Other (See Comments)    Social History   Social History Narrative   Married 1957. 4 children 2 boys 2 girls. 15 grandkids.  4 greatgrandkids.       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       Hobbies: genealogy, travel      No HCPOA-advised to do this.    Objective  Objective:  BP 102/60 (BP Location: Left Arm, Patient Position: Sitting, Cuff Size: Normal)   Pulse 78   Temp 98.2 F (36.8 C) (Temporal)   Ht 5' 11 (1.803 m)   Wt 214 lb (97.1 kg)   SpO2 94%   BMI 29.85 kg/m  Gen: NAD, resting comfortably, appears younger than stated age HEENT: Mucous membranes are moist. Oropharynx normal Neck: no thyromegaly CV: RRR no murmurs rubs or gallops Lungs: CTAB no crackles, wheeze, rhonchi Abdomen: soft/nontender/nondistended/normal bowel sounds. No rebound or guarding.  Ext: trace edema Skin: warm, dry Neuro: grossly normal, moves all extremities, PERRLA    Assessment and Plan  89 y.o. male presenting for annual physical.  Health Maintenance counseling: 1. Anticipatory guidance: Patient counseled regarding regular dental exams -q6 months, eye exams -yearly,  avoiding smoking and second hand smoke , limiting alcohol to 2 beverages per day - doesn't drink, no illicit drugs -none.   2. Risk factor reduction:  Advised patient of need for regular exercise and diet rich and fruits and vegetables to reduce risk of heart attack and stroke.  Exercise- 3 a week and cut down to 2 a weak or once with feeling weak afterwards but target is 2 Diet/weight management-Down 6 pounds in the last year- he's trying to get to 210- eating well.  Wt Readings from Last 3 Encounters:  11/24/23 214 lb (97.1  kg)  08/27/23 219 lb 3.2 oz (  99.4 kg)  07/07/23 219 lb 3.2 oz (99.4 kg)  3. Immunizations/screenings/ancillary studies-fully up-to-date  Immunization History  Administered Date(s) Administered   Fluad Quad(high Dose 65+) 01/20/2020, 11/01/2021, 10/14/2023   INFLUENZA, HIGH DOSE SEASONAL PF 10/12/2018, 10/24/2020, 10/15/2022   Influenza,inj,Quad PF,6+ Mos 11/11/2012   Influenza-Unspecified 10/25/2013, 10/27/2014, 10/24/2015, 10/19/2017   PFIZER Comirnaty(Gray Top)Covid-19 Tri-Sucrose Vaccine 10/15/2022   PFIZER(Purple Top)SARS-COV-2 Vaccination 03/06/2019, 03/24/2019, 01/20/2020, 06/27/2020   PNEUMOCOCCAL CONJUGATE-20 09/06/2020   Pfizer Covid-19 Vaccine Bivalent Booster 32yrs & up 12/10/2020, 06/05/2021   Pneumococcal Conjugate-13 08/21/2014   Pneumococcal Polysaccharide-23 10/02/2009   Td 09/17/2009, 10/31/2019   Unspecified SARS-COV-2 Vaccination 11/13/2023   Zoster Recombinant(Shingrix) 06/27/2020, 08/27/2020   Zoster, Live 12/29/2012   4. Prostate cancer screening- past age based screening recommendations    5. Colon cancer screening - past age based screening recommendations  6. Skin cancer screening- Dr. Shona as needed. advised regular sunscreen use. Denies worrisome, changing, or new skin lesions.  7. Smoking associated screening (lung cancer screening, AAA screen 65-75, UA)- never smoker 8. STD screening - not active- wife has moved out due to her dementia  Status of chronic or acute concerns   #last TSH low- recheck t3, t3 and tsh  #Heart failure with reduced ejection fraction/ ischemic cardiomypopathy- Dr. Cherrie #hypertension S: Medication:Lasix  40 mg as needed (takes potassium  with Lasix )- not needing -prior spironolactone  12.5 mg  at night stopped by cardiology 07/07/23 -prior losartan  25 mg daily but blood pressure too low -prior Carvedilol  3.125 mg twice daily -EF 07/25/22 of 30-35% stable  Edema: trace Weight gain:none- pretty stable on home weights Shortness  of breath: mildly worsening with time if blood pressure is low Blood pressure: in general has been better not as low off spironolactone  but still can get pretty low after exercise and he can feel run down. For instance on Thursday 9th 100/61 after working out. On the 14th it looked great again 132/80.   A/P:  CHF - euvolemic continue current medications  Hypertension - on low side but does not feel poorly today and no more blood pressure medicine to take off- continue without medicine  #CAD with defibrillator in place/hyperlipidemia #statin myalgia on higher doser #Apical thrombus-on Eliquis  2.5 mg twice daily - no recent bleeding. Occasionally dark stools with blackberry's- check hemoglobin today S: Medication:Eliquis  2.5 mg twice daily (previously required both Coumadin  for apical thrombus as well as aspirin  but Dr. Bensimhon has okayed Eliquis  alone), simvastatin  40 mg (half of 40 mg)  Lab Results  Component Value Date   CHOL 139 10/16/2022   HDL 56.40 10/16/2022   LDLCALC 65 10/16/2022   LDLDIRECT 75.0 06/06/2022   TRIG 88.0 10/16/2022   CHOLHDL 2 10/16/2022  -occasional chest pain - short episodes  A/P: coronary artery disease largley asymptomatic continue current medications  Lipids looked great last year- update today  # Diabetes S: Medication:metformin  500 mg XR Daily CBGs- Home readings have been between 120-150 fasting most #s in theis range. Had one time reading that was 160 but trended down Lab Results  Component Value Date   HGBA1C 6.1 06/18/2023   HGBA1C 6.4 02/17/2023   HGBA1C 6.1 10/16/2022  A/P: hopefully stable- update a1c today. Continue current meds for now   #BPH- on tamsulosin  0.4 mg daily. Apparently taking a second medicine- finasteride recently.   Recommended follow up: Return in about 6 months (around 05/24/2024) for followup or sooner if needed.Schedule b4 you leave. Future Appointments  Date Time Provider Department Center  02/22/2024  7:15 AM [facility]  DEVICE REMOTES [facility]  05/23/2024  7:15 AM [facility] DEVICE REMOTES [facility]  08/22/2024  7:15 AM [facility] DEVICE REMOTES [facility]  09/05/2024  1:00 PM [facility] ANNUAL WELLNESS VISIT 1 [facility] [place]  11/21/2024  7:15 AM [facility] DEVICE REMOTES [facility]  02/20/2025  7:15 AM [facility] DEVICE REMOTES [facility]   Lab/Order associations:NOT fasting   ICD-10-CM   1. Preventative health care  Z00.00     2. Non-insulin  dependent type 2 diabetes mellitus (HCC)  E11.9     3. Low TSH level  R79.89       No orders of the defined types were placed in this encounter.   Return precautions advised.  Garnette Lukes, MD

## 2023-11-25 LAB — LIPID PANEL
Cholesterol: 135 mg/dL (ref 0–200)
HDL: 45.1 mg/dL (ref >39.00)
LDL Cholesterol: 63 mg/dL (ref 0–99)
NonHDL: 89.78
Total CHOL/HDL Ratio: 3
Triglycerides: 136 mg/dL (ref 0.0–149.0)
VLDL: 27.2 mg/dL (ref 0.0–40.0)

## 2023-11-25 LAB — MICROALBUMIN / CREATININE URINE RATIO
Creatinine,U: 160 mg/dL
Microalb Creat Ratio: 5.9 mg/g (ref 0.0–30.0)
Microalb, Ur: 0.9 mg/dL (ref 0.0–1.9)

## 2023-11-25 LAB — COMPREHENSIVE METABOLIC PANEL WITH GFR
ALT: 20 U/L (ref 0–53)
AST: 20 U/L (ref 0–37)
Albumin: 4.3 g/dL (ref 3.5–5.2)
Alkaline Phosphatase: 60 U/L (ref 39–117)
BUN: 30 mg/dL — ABNORMAL HIGH (ref 6–23)
CO2: 25 meq/L (ref 19–32)
Calcium: 9.2 mg/dL (ref 8.4–10.5)
Chloride: 109 meq/L (ref 96–112)
Creatinine, Ser: 1.37 mg/dL (ref 0.40–1.50)
GFR: 45.01 mL/min — ABNORMAL LOW (ref >60.00)
Glucose, Bld: 152 mg/dL — ABNORMAL HIGH (ref 70–99)
Potassium: 3.9 meq/L (ref 3.5–5.1)
Sodium: 144 meq/L (ref 135–145)
Total Bilirubin: 0.6 mg/dL (ref 0.2–1.2)
Total Protein: 6.4 g/dL (ref 6.0–8.3)

## 2023-11-25 LAB — TSH: TSH: 0.24 u[IU]/mL — ABNORMAL LOW (ref 0.35–5.50)

## 2023-11-25 LAB — T4, FREE: Free T4: 0.8 ng/dL (ref 0.60–1.60)

## 2023-11-25 LAB — T3, FREE: T3, Free: 2.7 pg/mL (ref 2.3–4.2)

## 2023-11-25 NOTE — Progress Notes (Signed)
 Remote ICD Transmission

## 2023-11-26 ENCOUNTER — Other Ambulatory Visit

## 2023-11-26 ENCOUNTER — Ambulatory Visit: Payer: Self-pay | Admitting: Internal Medicine

## 2023-11-26 DIAGNOSIS — R7989 Other specified abnormal findings of blood chemistry: Secondary | ICD-10-CM

## 2023-11-27 LAB — THYROTROPIN RECEPTOR AUTOABS: Thyrotropin Receptor Ab: <1.1 IU/L (ref 0.00–1.75)

## 2023-11-28 ENCOUNTER — Ambulatory Visit: Payer: Self-pay | Admitting: Family Medicine

## 2023-12-22 ENCOUNTER — Other Ambulatory Visit: Payer: Self-pay | Admitting: Family Medicine

## 2023-12-22 MED ORDER — ONETOUCH ULTRA VI STRP: 1.0000 | ORAL_STRIP | Freq: Every day | 3 refills | Status: DC

## 2023-12-22 NOTE — Telephone Encounter (Signed)
 Copied from CRM [id number]. Topic: Clinical - Medication Refill >> Dec 22, 2023  1:02 PM Aleatha C wrote: Medication:Group mgd care Glucose blood test strips  Has the patient contacted their pharmacy? Yes (Agent: If no, request that the patient contact the pharmacy for the refill. If patient does not wish to contact the pharmacy document the reason why and proceed with request.) (Agent: If yes, when and what did the pharmacy advise?)  This is the patient's preferred pharmacy:  [id number] [place], [place] - [address] AT [facility] [address] [facility] [address] [facility] [address] [place] [address] Phone: [phone] Fax: [phone]   Is this the correct pharmacy for this prescription? Yes If no, delete pharmacy and type the correct one.   Has the prescription been filled recently? No  Is the patient out of the medication? Yes  Has the patient been seen for an appointment in the last year OR does the patient have an upcoming appointment? Yes  Can we respond through MyChart? No  Agent: Please be advised that Rx refills may take up to 3 business days. We ask that you follow-up with your pharmacy.

## 2023-12-28 ENCOUNTER — Other Ambulatory Visit: Payer: Self-pay | Admitting: Family Medicine

## 2023-12-28 ENCOUNTER — Other Ambulatory Visit (HOSPITAL_COMMUNITY): Payer: Self-pay | Admitting: Internal Medicine

## 2023-12-28 DIAGNOSIS — E785 Hyperlipidemia, unspecified: Secondary | ICD-10-CM

## 2024-01-15 ENCOUNTER — Other Ambulatory Visit (HOSPITAL_COMMUNITY): Payer: Self-pay | Admitting: Internal Medicine

## 2024-01-15 ENCOUNTER — Telehealth: Payer: Self-pay

## 2024-01-15 ENCOUNTER — Other Ambulatory Visit (HOSPITAL_COMMUNITY): Payer: Self-pay | Admitting: Family Medicine

## 2024-01-15 ENCOUNTER — Other Ambulatory Visit: Payer: Self-pay

## 2024-01-15 DIAGNOSIS — E785 Hyperlipidemia, unspecified: Secondary | ICD-10-CM

## 2024-01-15 MED ORDER — SIMVASTATIN 40 MG PO TABS: 20.0000 mg | ORAL_TABLET | Freq: Every day | ORAL | 2 refills | Status: AC

## 2024-01-15 NOTE — Telephone Encounter (Signed)
 Correct prescription error. Medication has been sent to pharmacy.

## 2024-01-28 ENCOUNTER — Telehealth: Payer: Self-pay | Admitting: Family Medicine

## 2024-01-28 MED ORDER — BLOOD GLUCOSE MONITORING SUPPL DEVI: 1.0000 | 0 refills | Status: AC

## 2024-01-28 MED ORDER — BLOOD GLUCOSE TEST VI STRP: 1.0000 | ORAL_STRIP | 0 refills | Status: AC

## 2024-01-28 MED ORDER — LANCETS MISC: 1.0000 | 0 refills | Status: AC

## 2024-01-28 MED ORDER — LANCET DEVICE MISC: 1.0000 | 0 refills | Status: AC

## 2024-01-28 NOTE — Telephone Encounter (Signed)
 Spoke with patient and advised if he would like to take the RSV vaccine he can. Will send contour to [facility].   Copied from CRM #[id number]. Topic: Clinical - Medication Question >> Jan 28, 2024  9:36 AM Dedra B wrote: Reason for CRM: Pt said he received a letter with a list of immunizations that he needs. He has all of them except RSV. He wants to know if he needs to get the RSV vaccine.  Pt also received a letter from his insurance saying that coverage of his glucometer was changing from the OneTouch to Contour. Pt would like the Contour sent to [facility]. Pt said he is going to the gym around 12 and would like a call before 11:30 or after 1:30.

## 2024-02-22 ENCOUNTER — Ambulatory Visit (INDEPENDENT_AMBULATORY_CARE_PROVIDER_SITE_OTHER)

## 2024-02-22 DIAGNOSIS — I5022 Chronic systolic (congestive) heart failure: Secondary | ICD-10-CM

## 2024-02-23 LAB — CUP PACEART REMOTE DEVICE CHECK
Battery Remaining Longevity: 20 mo
Battery Voltage: 2.92 V
Brady Statistic RV Percent Paced: 0.15 %
Date Time Interrogation Session: 20260112091808
HighPow Impedance: 74 Ohm
Implantable Lead Connection Status: 753985
Implantable Lead Implant Date: 20150422
Implantable Lead Location: 753860
Implantable Lead Model: 6935
Implantable Pulse Generator Implant Date: 20150422
Lead Channel Impedance Value: 418 Ohm
Lead Channel Impedance Value: 475 Ohm
Lead Channel Pacing Threshold Amplitude: 0.5 V
Lead Channel Pacing Threshold Pulse Width: 0.4 ms
Lead Channel Sensing Intrinsic Amplitude: 12.625 mV
Lead Channel Sensing Intrinsic Amplitude: 12.625 mV
Lead Channel Setting Pacing Amplitude: 2 V
Lead Channel Setting Pacing Pulse Width: 0.4 ms
Lead Channel Setting Sensing Sensitivity: 0.3 mV

## 2024-02-26 NOTE — Progress Notes (Signed)
 Remote ICD Transmission

## 2024-03-17 NOTE — Progress Notes (Unsigned)
" °  Electrophysiology Office Note:   Date:  03/18/2024  ID:  Joseph Malone, DOB 20-Dec-1930, MRN [id number]  Primary Cardiologist: None Primary Heart Failure: Toribio Fuel, MD Electrophysiologist: Eulas FORBES Furbish, MD       History of Present Illness:   Joseph Malone is a 89 y.o. male with h/o HFrEF s/p ICD, anterior MI / CAD s/p CABG seen today for routine electrophysiology followup.   Since last being seen in our clinic the patient reports doing well. He asks how much longer he has on his device. He notes his wife has dementia and he was caring for her but they had to move her to another house.  He goes to the [facility] and works out, does not have issues with exertion or chest pain. He denies chest pain, palpitations, dyspnea, PND, orthopnea, nausea, vomiting, dizziness, syncope, edema, weight gain, or early satiety.   Review of systems complete and found to be negative unless listed in HPI.   EP Information / Studies Reviewed:    EKG is ordered today. Personal review as below.  EKG Interpretation Date/Time:  Friday March 18 2024 11:16:31 EST Ventricular Rate:  107 PR Interval:  166 QRS Duration:  82 QT Interval:  320 QTC Calculation: 427 R Axis:   27  Text Interpretation: Sinus tachycardia Low voltage QRS Confirmed by Aniceto Jarvis ([id number]) on 03/18/2024 11:22:11 AM   ICD Interrogation-  reviewed in detail today,  See PACEART report.  Device History: Medtronic Single Chamber ICD implanted 06/01/13 for HFrEF / ICM  History of appropriate therapy: No History of AAD therapy: No   Risk Assessment/Calculations:              Physical Exam:   VS:  BP 138/82   Pulse (!) 107   Ht 5' 11 (1.803 m)   Wt 212 lb (96.2 kg)   SpO2 95%   BMI 29.57 kg/m    Wt Readings from Last 3 Encounters:  03/18/24 212 lb (96.2 kg)  11/24/23 214 lb (97.1 kg)  08/27/23 219 lb 3.2 oz (99.4 kg)     GEN: Well nourished, well developed in no acute distress NECK: No JVD; No carotid  bruits CARDIAC: Regular rate and rhythm, no murmurs, rubs, gallops RESPIRATORY:  Clear to auscultation without rales, wheezing or rhonchi  ABDOMEN: Soft, non-tender, non-distended EXTREMITIES:  No edema; No deformity   ASSESSMENT AND PLAN:    Chronic Systolic Dysfunction due to ICM s/p Medtronic single chamber ICD  -euvolemic on exam   -Intolerant of GDMT due to hypotension, dizziness -Normal ICD function -See Pace Art report -No changes today -follows with [facility] / Dr. Fuel   CAD s/p CABG -no anginal symptoms     Apical Thrombus  Secondary Hypercoagulable State  -continue Eliquis  2.5 mg, dose reviewed and appropriate by age / Cr -pending follow up with Dr. Katrinka for annual visit    Hx GIB  -per primary / GI    Disposition:   Follow up with EP APP in 12 months   Signed, Jarvis Aniceto, NP-C, AGACNP-BC [facility] - Electrophysiology  03/18/2024, 11:50 AM  "

## 2024-03-18 ENCOUNTER — Ambulatory Visit: Admitting: Pulmonary Disease

## 2024-03-18 ENCOUNTER — Encounter: Payer: Self-pay | Admitting: Pulmonary Disease

## 2024-03-18 VITALS — BP 138/82 | HR 107 | Ht 71.0 in | Wt 212.0 lb

## 2024-03-18 DIAGNOSIS — I513 Intracardiac thrombosis, not elsewhere classified: Secondary | ICD-10-CM

## 2024-03-18 DIAGNOSIS — I5022 Chronic systolic (congestive) heart failure: Secondary | ICD-10-CM

## 2024-03-18 DIAGNOSIS — Z95810 Presence of automatic (implantable) cardiac defibrillator: Secondary | ICD-10-CM

## 2024-03-18 DIAGNOSIS — D6869 Other thrombophilia: Secondary | ICD-10-CM

## 2024-03-18 DIAGNOSIS — I2510 Atherosclerotic heart disease of native coronary artery without angina pectoris: Secondary | ICD-10-CM

## 2024-03-18 LAB — CUP PACEART INCLINIC DEVICE CHECK
Battery Remaining Longevity: 20 mo
Battery Voltage: 2.92 V
Brady Statistic RV Percent Paced: 0.11 %
Date Time Interrogation Session: 20260206114910
HighPow Impedance: 69 Ohm
Implantable Lead Connection Status: 753985
Implantable Lead Implant Date: 20150422
Implantable Lead Location: 753860
Implantable Lead Model: 6935
Implantable Pulse Generator Implant Date: 20150422
Lead Channel Impedance Value: 418 Ohm
Lead Channel Impedance Value: 513 Ohm
Lead Channel Pacing Threshold Amplitude: 0.625 V
Lead Channel Pacing Threshold Pulse Width: 0.4 ms
Lead Channel Sensing Intrinsic Amplitude: 12.625 mV
Lead Channel Sensing Intrinsic Amplitude: 13.875 mV
Lead Channel Setting Pacing Amplitude: 2 V
Lead Channel Setting Pacing Pulse Width: 0.4 ms
Lead Channel Setting Sensing Sensitivity: 0.3 mV

## 2024-03-18 NOTE — Patient Instructions (Signed)
 Medication Instructions:  Your physician recommends that you continue on your current medications as directed. Please refer to the Current Medication list given to you today.  *If you need a refill on your cardiac medications before your next appointment, please call your pharmacy*  Lab Work: None ordered If you have labs (blood work) drawn today and your tests are completely normal, you will receive your results only by: MyChart Message (if you have MyChart) OR A paper copy in the mail If you have any lab test that is abnormal or we need to change your treatment, we will call you to review the results.  Follow-Up: At [facility], you and your health needs are our priority.  As part of our continuing mission to provide you with exceptional heart care, our providers are all part of one team.  This team includes your primary Cardiologist (physician) and Advanced Practice Providers or APPs (Physician Assistants and Nurse Practitioners) who all work together to provide you with the care you need, when you need it.  Your next appointment:   1 year(s)  Provider:   Daphne Barrack, NP

## 2024-05-23 ENCOUNTER — Encounter

## 2024-05-26 ENCOUNTER — Ambulatory Visit: Admitting: Family Medicine

## 2024-08-22 ENCOUNTER — Encounter

## 2024-09-05 ENCOUNTER — Ambulatory Visit

## 2024-11-21 ENCOUNTER — Encounter

## 2025-02-20 ENCOUNTER — Encounter
